# Patient Record
Sex: Female | Born: 1935 | Race: White | Hispanic: No | Marital: Married | Smoking: Never smoker
Health system: Southern US, Community
[De-identification: ages and names within clinical notes are randomized; demographics above are authoritative.]

## PROBLEM LIST (undated history)

## (undated) DIAGNOSIS — J9809 Other diseases of bronchus, not elsewhere classified: Secondary | ICD-10-CM

## (undated) DIAGNOSIS — R001 Bradycardia, unspecified: Secondary | ICD-10-CM

## (undated) DIAGNOSIS — M810 Age-related osteoporosis without current pathological fracture: Secondary | ICD-10-CM

## (undated) DIAGNOSIS — I480 Paroxysmal atrial fibrillation: Secondary | ICD-10-CM

## (undated) DIAGNOSIS — I10 Essential (primary) hypertension: Secondary | ICD-10-CM

## (undated) DIAGNOSIS — R0600 Dyspnea, unspecified: Secondary | ICD-10-CM

## (undated) DIAGNOSIS — S329XXA Fracture of unspecified parts of lumbosacral spine and pelvis, initial encounter for closed fracture: Secondary | ICD-10-CM

## (undated) DIAGNOSIS — H269 Unspecified cataract: Secondary | ICD-10-CM

## (undated) DIAGNOSIS — Z780 Asymptomatic menopausal state: Secondary | ICD-10-CM

## (undated) DIAGNOSIS — J8410 Pulmonary fibrosis, unspecified: Secondary | ICD-10-CM

## (undated) DIAGNOSIS — Z9289 Personal history of other medical treatment: Secondary | ICD-10-CM

## (undated) DIAGNOSIS — I679 Cerebrovascular disease, unspecified: Secondary | ICD-10-CM

## (undated) DIAGNOSIS — R011 Cardiac murmur, unspecified: Secondary | ICD-10-CM

## (undated) DIAGNOSIS — K5790 Diverticulosis of intestine, part unspecified, without perforation or abscess without bleeding: Secondary | ICD-10-CM

## (undated) DIAGNOSIS — M069 Rheumatoid arthritis, unspecified: Secondary | ICD-10-CM

## (undated) DIAGNOSIS — M1990 Unspecified osteoarthritis, unspecified site: Secondary | ICD-10-CM

## (undated) DIAGNOSIS — I499 Cardiac arrhythmia, unspecified: Secondary | ICD-10-CM

## (undated) HISTORY — DX: Unspecified cataract: H26.9

## (undated) HISTORY — DX: Paroxysmal atrial fibrillation: I48.0

## (undated) HISTORY — DX: Asymptomatic menopausal state: Z78.0

## (undated) HISTORY — DX: Bradycardia, unspecified: R00.1

## (undated) HISTORY — PX: CATARACT EXTRACTION: SUR2

## (undated) HISTORY — DX: Diverticulosis of intestine, part unspecified, without perforation or abscess without bleeding: K57.90

## (undated) HISTORY — DX: Unspecified osteoarthritis, unspecified site: M19.90

## (undated) HISTORY — PX: EYE SURGERY: SHX253

## (undated) HISTORY — DX: Cerebrovascular disease, unspecified: I67.9

## (undated) HISTORY — DX: Age-related osteoporosis without current pathological fracture: M81.0

## (undated) HISTORY — DX: Other diseases of bronchus, not elsewhere classified: J98.09

## (undated) HISTORY — PX: BREAST SURGERY: SHX581

## (undated) HISTORY — PX: COLONOSCOPY: SHX174

## (undated) HISTORY — DX: Fracture of unspecified parts of lumbosacral spine and pelvis, initial encounter for closed fracture: S32.9XXA

## (undated) HISTORY — DX: Personal history of other medical treatment: Z92.89

## (undated) HISTORY — PX: BIOPSY BREAST: PRO8

## (undated) HISTORY — DX: Essential (primary) hypertension: I10

---
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
## 1999-01-30 ENCOUNTER — Other Ambulatory Visit: Admission: RE | Admit: 1999-01-30 | Discharge: 1999-01-30 | Payer: Self-pay | Admitting: Family Medicine

## 2000-02-13 ENCOUNTER — Other Ambulatory Visit: Admission: RE | Admit: 2000-02-13 | Discharge: 2000-02-13 | Payer: Self-pay | Admitting: Family Medicine

## 2000-04-15 ENCOUNTER — Ambulatory Visit (HOSPITAL_COMMUNITY): Admission: RE | Admit: 2000-04-15 | Discharge: 2000-04-15 | Payer: Self-pay | Admitting: Gastroenterology

## 2002-03-10 ENCOUNTER — Other Ambulatory Visit: Admission: RE | Admit: 2002-03-10 | Discharge: 2002-03-10 | Payer: Self-pay | Admitting: Family Medicine

## 2003-07-18 ENCOUNTER — Encounter: Payer: Self-pay | Admitting: Family Medicine

## 2003-07-18 ENCOUNTER — Ambulatory Visit (HOSPITAL_COMMUNITY): Admission: RE | Admit: 2003-07-18 | Discharge: 2003-07-18 | Payer: Self-pay | Admitting: Family Medicine

## 2003-07-27 ENCOUNTER — Ambulatory Visit (HOSPITAL_COMMUNITY): Admission: RE | Admit: 2003-07-27 | Discharge: 2003-07-27 | Payer: Self-pay | Admitting: Family Medicine

## 2003-07-27 ENCOUNTER — Encounter: Payer: Self-pay | Admitting: Family Medicine

## 2005-01-21 ENCOUNTER — Other Ambulatory Visit: Admission: RE | Admit: 2005-01-21 | Discharge: 2005-01-21 | Payer: Self-pay | Admitting: Family Medicine

## 2006-06-27 ENCOUNTER — Inpatient Hospital Stay (HOSPITAL_COMMUNITY): Admission: AD | Admit: 2006-06-27 | Discharge: 2006-07-02 | Payer: Self-pay | Admitting: Cardiology

## 2006-08-11 ENCOUNTER — Encounter: Admission: RE | Admit: 2006-08-11 | Discharge: 2006-08-11 | Payer: Self-pay | Admitting: Cardiology

## 2006-09-29 ENCOUNTER — Ambulatory Visit (HOSPITAL_COMMUNITY): Admission: RE | Admit: 2006-09-29 | Discharge: 2006-09-30 | Payer: Self-pay | Admitting: Interventional Cardiology

## 2007-01-23 ENCOUNTER — Encounter: Admission: RE | Admit: 2007-01-23 | Discharge: 2007-01-23 | Payer: Self-pay | Admitting: Cardiology

## 2007-02-18 ENCOUNTER — Other Ambulatory Visit: Admission: RE | Admit: 2007-02-18 | Discharge: 2007-02-18 | Payer: Self-pay | Admitting: Family Medicine

## 2010-12-08 ENCOUNTER — Encounter: Payer: Self-pay | Admitting: Family Medicine

## 2010-12-09 ENCOUNTER — Encounter: Payer: Self-pay | Admitting: Cardiology

## 2013-02-03 ENCOUNTER — Telehealth: Payer: Self-pay | Admitting: Pharmacist

## 2013-02-03 DIAGNOSIS — I4891 Unspecified atrial fibrillation: Secondary | ICD-10-CM

## 2013-02-03 NOTE — Telephone Encounter (Signed)
appt made for follow up for 02-09-13

## 2013-02-09 ENCOUNTER — Ambulatory Visit (INDEPENDENT_AMBULATORY_CARE_PROVIDER_SITE_OTHER): Payer: Medicare Other | Admitting: Pharmacist Clinician (PhC)/ Clinical Pharmacy Specialist

## 2013-02-09 DIAGNOSIS — I482 Chronic atrial fibrillation, unspecified: Secondary | ICD-10-CM | POA: Insufficient documentation

## 2013-02-09 DIAGNOSIS — I4891 Unspecified atrial fibrillation: Secondary | ICD-10-CM

## 2013-02-22 ENCOUNTER — Telehealth: Payer: Self-pay | Admitting: *Deleted

## 2013-02-22 ENCOUNTER — Ambulatory Visit (INDEPENDENT_AMBULATORY_CARE_PROVIDER_SITE_OTHER): Payer: Medicare Other | Admitting: Pharmacist

## 2013-02-22 DIAGNOSIS — J069 Acute upper respiratory infection, unspecified: Secondary | ICD-10-CM

## 2013-02-22 DIAGNOSIS — I4891 Unspecified atrial fibrillation: Secondary | ICD-10-CM

## 2013-02-22 MED ORDER — AMOXICILLIN-POT CLAVULANATE 875-125 MG PO TABS: 1.0000 | ORAL_TABLET | Freq: Two times a day (BID) | ORAL | Status: DC

## 2013-02-22 NOTE — Telephone Encounter (Signed)
ALL SYMPTOMS NOW-  CONGESTION- STARTED IN HEAD NOW MOVING DOWN TO CHEST COUGH LOSS OF APPETITE NASAL DRAINAGE  NO SORE THROAT NO EAR PAIN NO EYE IRRITATION  USES [facility], [place] MEDS AND ALLERGIES ARE UP TO DATE- WENT OVER WITH PT TODAY PREFERS [place]?

## 2013-03-12 ENCOUNTER — Telehealth: Payer: Self-pay | Admitting: Family Medicine

## 2013-03-12 NOTE — Telephone Encounter (Signed)
Has daily swelling in the evening that generally resolves by morning.  Over the past 3-4 days she has had persistent swelling throughout the day.  Mild dyspnea with exertion but is still able to perform daily tasks.  She played golf yesterday.  Takes HCTZ 25mg  daily. She has taken a second HCTZ in the evening a few times and didn't notice an improvement.  She has Afib and has noticed some irregularity recently.    She has placed a call to her cardiologist Dr. Irish Lack with [facility].  His nurse is going to consult with him and call her back.    Suggested she wait to hear from the cardiologist and if they aren't able to assist her to give me a call back.    In the meantime she should rest with her feet elevated.

## 2013-04-05 ENCOUNTER — Ambulatory Visit (INDEPENDENT_AMBULATORY_CARE_PROVIDER_SITE_OTHER): Payer: Medicare Other | Admitting: Pharmacist

## 2013-04-05 DIAGNOSIS — I4891 Unspecified atrial fibrillation: Secondary | ICD-10-CM

## 2013-04-05 NOTE — Patient Instructions (Signed)
Anticoagulation Dose Instructions as of 04/05/2013     Dorene Grebe Tue Wed Thu Fri Sat   New Dose 2.5 mg 5 mg 2.5 mg 2.5 mg 2.5 mg 5 mg 2.5 mg    Description       5mg  [= 1 tablet] on mondays and fridays, 2.5mg [=1/2 tablet] all other days      INR = 2.5 today

## 2013-04-14 ENCOUNTER — Other Ambulatory Visit (INDEPENDENT_AMBULATORY_CARE_PROVIDER_SITE_OTHER): Payer: Medicare Other

## 2013-04-14 DIAGNOSIS — Z79899 Other long term (current) drug therapy: Secondary | ICD-10-CM

## 2013-04-14 DIAGNOSIS — E785 Hyperlipidemia, unspecified: Secondary | ICD-10-CM

## 2013-04-14 DIAGNOSIS — R5381 Other malaise: Secondary | ICD-10-CM

## 2013-04-14 DIAGNOSIS — N390 Urinary tract infection, site not specified: Secondary | ICD-10-CM

## 2013-04-14 DIAGNOSIS — R5383 Other fatigue: Secondary | ICD-10-CM

## 2013-04-14 LAB — POCT CBC
Granulocyte percent: 73.9 %G (ref 37–80)
Lymph, poc: 1.4 (ref 0.6–3.4)
MCV: 92.1 fL (ref 80–97)
MPV: 7.4 fL (ref 0–99.8)
Platelet Count, POC: 153 10*3/uL (ref 142–424)
RBC: 4.2 M/uL (ref 4.04–5.48)

## 2013-04-14 LAB — POCT URINALYSIS DIPSTICK
Nitrite, UA: NEGATIVE
Spec Grav, UA: 1.01
Urobilinogen, UA: NEGATIVE
pH, UA: 6.5

## 2013-04-14 LAB — BASIC METABOLIC PANEL WITH GFR
BUN: 20 mg/dL (ref 6–23)
Chloride: 106 mEq/L (ref 96–112)
GFR, Est Non African American: 55 mL/min — ABNORMAL LOW
Potassium: 4 mEq/L (ref 3.5–5.3)
Sodium: 142 mEq/L (ref 135–145)

## 2013-04-14 LAB — POCT UA - MICROSCOPIC ONLY
Casts, Ur, LPF, POC: NEGATIVE
Crystals, Ur, HPF, POC: NEGATIVE

## 2013-04-14 LAB — HEPATIC FUNCTION PANEL
Alkaline Phosphatase: 67 U/L (ref 39–117)
Bilirubin, Direct: 0.1 mg/dL (ref 0.0–0.3)
Indirect Bilirubin: 0.6 mg/dL (ref 0.0–0.9)
Total Bilirubin: 0.7 mg/dL (ref 0.3–1.2)

## 2013-04-15 LAB — NMR LIPOPROFILE WITH LIPIDS
Cholesterol, Total: 131 mg/dL (ref <200)
HDL Particle Number: 30.6 umol/L (ref >=30.5)
HDL-C: 53 mg/dL (ref >=40)
LP-IR Score: <25 (ref <=45)
Large HDL-P: 12 umol/L (ref >=4.8)
Triglycerides: 69 mg/dL (ref <150)

## 2013-04-19 ENCOUNTER — Telehealth: Payer: Self-pay | Admitting: Family Medicine

## 2013-04-19 NOTE — Telephone Encounter (Signed)
Pt notified of lab results

## 2013-04-19 NOTE — Telephone Encounter (Signed)
Message copied by Marin Olp on Mon Apr 19, 2013  6:05 PM ------      Message from: Chipper Herb      Created: Thu Apr 15, 2013  8:24 PM       The BMP had normal electrolytes, a normal blood sugar, and a normal kidney function tests.      2 liver function tests were slightly elevated. The liver function tests should be rechecked in 4-6 weeks.      All cholesterol test was advanced lipid testing were excellent and goal.                   ------

## 2013-04-21 ENCOUNTER — Encounter: Payer: Self-pay | Admitting: Family Medicine

## 2013-04-21 ENCOUNTER — Ambulatory Visit (INDEPENDENT_AMBULATORY_CARE_PROVIDER_SITE_OTHER): Payer: Medicare Other

## 2013-04-21 ENCOUNTER — Ambulatory Visit (INDEPENDENT_AMBULATORY_CARE_PROVIDER_SITE_OTHER): Payer: Medicare Other | Admitting: Family Medicine

## 2013-04-21 VITALS — BP 126/77 | HR 106 | Temp 98.1°F | Ht 61.25 in | Wt 136.4 lb

## 2013-04-21 DIAGNOSIS — M810 Age-related osteoporosis without current pathological fracture: Secondary | ICD-10-CM

## 2013-04-21 DIAGNOSIS — M545 Low back pain, unspecified: Secondary | ICD-10-CM

## 2013-04-21 DIAGNOSIS — I10 Essential (primary) hypertension: Secondary | ICD-10-CM

## 2013-04-21 DIAGNOSIS — Z0000 Encounter for general adult medical examination without abnormal findings: Secondary | ICD-10-CM

## 2013-04-21 DIAGNOSIS — R5381 Other malaise: Secondary | ICD-10-CM

## 2013-04-21 DIAGNOSIS — R5383 Other fatigue: Secondary | ICD-10-CM

## 2013-04-21 DIAGNOSIS — R0602 Shortness of breath: Secondary | ICD-10-CM

## 2013-04-21 DIAGNOSIS — I4891 Unspecified atrial fibrillation: Secondary | ICD-10-CM

## 2013-04-21 LAB — THYROID PANEL WITH TSH
T3 Uptake: 33.8 % (ref 22.5–37.0)
T4, Total: 9.1 ug/dL (ref 5.0–12.5)

## 2013-04-21 NOTE — Progress Notes (Addendum)
Subjective:    Patient ID: Susan Fitzgerald, female    DOB: 09-30-1932, 79 y.o.   MRN: [id number]  HPI Patient comes in today for followup of chronic medical problems and a physical exam. Her biggest complaint today is the increase shortness of breath that she has with especially going up inclines. She is seeing a cardiologist and he is trying to regulate some of her high risk medication. She has had increased edema in her legs for the past 3 or 4 weeks as well as an increased heart rate.   Review of Systems  Constitutional: Negative.   HENT: Negative.   Eyes: Negative.   Respiratory: Positive for shortness of breath (with exertion).   Cardiovascular: Positive for palpitations (occasional) and leg swelling (daily). Negative for chest pain.  Gastrointestinal: Negative.   Genitourinary: Positive for frequency (due to meds).  Musculoskeletal: Positive for back pain (LBP).  Skin: Negative.   Allergic/Immunologic: Negative.   Neurological: Positive for light-headedness (with movement). Negative for headaches.  Hematological: Bruises/bleeds easily (due to meds).  Psychiatric/Behavioral: Positive for sleep disturbance (nightly).       Objective:   Physical Exam .BP 126/77  Pulse 106  Temp(Src) 98.1 F (36.7 C) (Oral)  Ht 5' 1.25" (1.556 m)  Wt 136 lb 6.4 oz (61.871 kg)  BMI 25.55 kg/m2  The patient appeared well nourished and normally developed, alert and oriented to time and place. Speech, behavior and judgement appear normal. Vital signs as documented.  Head exam is unremarkable. No scleral icterus or pallor noted. Mouth and throat were normal. TMs were normal and ear canals were Neck is without jugular venous distension, thyromegally, or carotid bruits. Carotid upstrokes are brisk bilaterally. No cervical adenopathy. Lungs are clear anteriorly and posteriorly to auscultation. Normal respiratory effort. Cardiac exam reveals irregular irregular rate and rhythm at 96 per minute. First  and second heart sounds normal.  No murmurs, rubs or gallops.  Abdominal exam reveals normal bowl sounds, no masses, no organomegaly and no aortic enlargement. No inguinal adenopathy. No abdominal bruits. Extremities are 1+ edematous and both femoral and pedal pulses are normal. Low back is painful at time. Skin without pallor or jaundice.  Warm and dry, without rash. Neurologic exam reveals normal deep tendon reflexes and normal sensation.  [facility] reading (PRIMARY) by  Dr. Laurance Flatten: Chest x-ray; mild cardiac enlargement                                                                      LS-spine; degenerative disc disease and scoliosis                                       Assessment & Plan:  1. Hypertension - DG Chest 2 View; Future  2. Osteoporosis - DG Lumbar Spine 2-3 Views; Future  3. LBP (low back pain) - DG Lumbar Spine 2-3 Views; Future  4. Fatigue - Thyroid Panel With TSH  5. Low back pain  6. Atrial fibrillation  7. Annual physical exam  Patient Instructions  Continue current medications See cardiologist about better rate control Be careful not to climb and had an accident and fall We will arrange  appointment for pelvic exam

## 2013-04-21 NOTE — Patient Instructions (Addendum)
Continue current medications See cardiologist about better rate control Be careful not to climb and had an accident and fall We will arrange appointment for pelvic exam

## 2013-04-22 ENCOUNTER — Telehealth: Payer: Self-pay | Admitting: *Deleted

## 2013-04-22 NOTE — Telephone Encounter (Signed)
Message copied by Marin Olp on Thu Apr 22, 2013  6:24 PM ------      Message from: Chipper Herb      Created: Wed Apr 21, 2013  6:04 PM       As per report, there is moderate cardiomegaly      Make sure that cardiologist gives copy of this      Make sure that patient gets paper copy of this report ------

## 2013-04-22 NOTE — Telephone Encounter (Signed)
Pt notified of results

## 2013-04-22 NOTE — Telephone Encounter (Signed)
Pt notified Copy sent to Dr Irish Lack

## 2013-04-22 NOTE — Telephone Encounter (Signed)
Message copied by Marin Olp on Thu Apr 22, 2013  4:40 PM ------      Message from: Chipper Herb      Created: Thu Apr 22, 2013  8:20 AM       All thyroid tests within normal limit      Send copy to cardiologist ------

## 2013-04-29 ENCOUNTER — Ambulatory Visit (INDEPENDENT_AMBULATORY_CARE_PROVIDER_SITE_OTHER): Payer: Medicare Other | Admitting: Pharmacist

## 2013-04-29 VITALS — BP 132/76 | HR 100

## 2013-04-29 DIAGNOSIS — I4891 Unspecified atrial fibrillation: Secondary | ICD-10-CM

## 2013-04-29 LAB — POCT INR: INR: 2.1

## 2013-04-29 NOTE — Progress Notes (Signed)
Left message on Amy's voice mail - nurse at [facility] cardiology to let them know about patient's HR and BP.  Patient is concerned about how she feels - legs are heavy and fatigues easier.

## 2013-05-17 ENCOUNTER — Ambulatory Visit: Payer: Self-pay | Admitting: Pharmacist

## 2013-05-17 ENCOUNTER — Other Ambulatory Visit (INDEPENDENT_AMBULATORY_CARE_PROVIDER_SITE_OTHER): Payer: Medicare Other

## 2013-05-17 DIAGNOSIS — I4891 Unspecified atrial fibrillation: Secondary | ICD-10-CM

## 2013-05-17 LAB — POCT INR: INR: 2.1

## 2013-05-17 NOTE — Patient Instructions (Signed)
Anticoagulation Dose Instructions as of 05/17/2013     Susan Fitzgerald Tue Wed Thu Fri Sat   New Dose 2.5 mg 5 mg 2.5 mg 2.5 mg 2.5 mg 5 mg 2.5 mg    Description       Continue 5mg  [= 1 tablet] on mondays and fridays, 2.5mg [=1/2 tablet] all other days.         INR was 2.1

## 2013-05-24 ENCOUNTER — Telehealth: Payer: Self-pay | Admitting: Family Medicine

## 2013-05-24 NOTE — Telephone Encounter (Signed)
Patient states that she fell down basement steps yesterday and cut her chin. Bleeding has stopped and she is just a little sore. Advised patient that we would be happy to look at area although it may be too late for stitches. She stated that she would wait and if it became worse she would let us know.

## 2013-05-26 NOTE — Progress Notes (Signed)
Patient came in for labs only.

## 2013-05-31 ENCOUNTER — Ambulatory Visit (INDEPENDENT_AMBULATORY_CARE_PROVIDER_SITE_OTHER): Payer: Medicare Other | Admitting: Pharmacist

## 2013-05-31 DIAGNOSIS — I4891 Unspecified atrial fibrillation: Secondary | ICD-10-CM

## 2013-05-31 DIAGNOSIS — Z79899 Other long term (current) drug therapy: Secondary | ICD-10-CM

## 2013-05-31 NOTE — Patient Instructions (Signed)
Anticoagulation Dose Instructions as of 05/31/2013     Dorene Grebe Tue Wed Thu Fri Sat   New Dose 2.5 mg 5 mg 2.5 mg 2.5 mg 2.5 mg 5 mg 2.5 mg    Description       Continue 5mg  [= 1 tablet] on mondays and fridays, 2.5mg [=1/2 tablet] all other days.         INR was 2.1 today

## 2013-06-01 ENCOUNTER — Telehealth: Payer: Self-pay | Admitting: Pharmacist

## 2013-06-01 LAB — BASIC METABOLIC PANEL WITH GFR
Calcium: 9.2 mg/dL (ref 8.4–10.5)
GFR, Est African American: 71 mL/min
GFR, Est Non African American: 62 mL/min
Glucose, Bld: 67 mg/dL — ABNORMAL LOW (ref 70–99)
Sodium: 140 mEq/L (ref 135–145)

## 2013-06-01 NOTE — Telephone Encounter (Signed)
BMP results showed low blood glucose.  Discussed with patient s/s of hypoglycemia.   Her husband has glucometer.  Advised that if she has signs of hypoglycemia she should check BG.  If less than 70 then needs to drink OJ or regular soda.   Recommended that patient eat 3 meals daily and small snacks during the day especially when involved in physical activity.

## 2013-06-02 ENCOUNTER — Encounter (HOSPITAL_COMMUNITY): Payer: Self-pay | Admitting: Cardiology

## 2013-06-02 ENCOUNTER — Inpatient Hospital Stay (HOSPITAL_COMMUNITY)
Admission: AD | Admit: 2013-06-02 | Discharge: 2013-06-04 | DRG: 310 | Disposition: A | Discharge status: Home / Self Care | Payer: Medicare Other | Source: Ambulatory Visit | Attending: Interventional Cardiology | Admitting: Interventional Cardiology

## 2013-06-02 ENCOUNTER — Ambulatory Visit (INDEPENDENT_AMBULATORY_CARE_PROVIDER_SITE_OTHER): Payer: Medicare Other | Admitting: Family Medicine

## 2013-06-02 VITALS — BP 156/70 | HR 48 | Temp 98.2°F

## 2013-06-02 DIAGNOSIS — Z7901 Long term (current) use of anticoagulants: Secondary | ICD-10-CM

## 2013-06-02 DIAGNOSIS — I4891 Unspecified atrial fibrillation: Secondary | ICD-10-CM | POA: Diagnosis present

## 2013-06-02 DIAGNOSIS — R531 Weakness: Secondary | ICD-10-CM

## 2013-06-02 DIAGNOSIS — I359 Nonrheumatic aortic valve disorder, unspecified: Secondary | ICD-10-CM | POA: Diagnosis present

## 2013-06-02 DIAGNOSIS — Z823 Family history of stroke: Secondary | ICD-10-CM

## 2013-06-02 DIAGNOSIS — R5383 Other fatigue: Secondary | ICD-10-CM

## 2013-06-02 DIAGNOSIS — R5381 Other malaise: Secondary | ICD-10-CM

## 2013-06-02 DIAGNOSIS — R001 Bradycardia, unspecified: Secondary | ICD-10-CM | POA: Diagnosis present

## 2013-06-02 DIAGNOSIS — I498 Other specified cardiac arrhythmias: Secondary | ICD-10-CM

## 2013-06-02 DIAGNOSIS — I440 Atrioventricular block, first degree: Secondary | ICD-10-CM | POA: Diagnosis present

## 2013-06-02 DIAGNOSIS — Z9849 Cataract extraction status, unspecified eye: Secondary | ICD-10-CM

## 2013-06-02 DIAGNOSIS — I482 Chronic atrial fibrillation, unspecified: Secondary | ICD-10-CM | POA: Diagnosis present

## 2013-06-02 DIAGNOSIS — I10 Essential (primary) hypertension: Secondary | ICD-10-CM | POA: Diagnosis present

## 2013-06-02 DIAGNOSIS — I495 Sick sinus syndrome: Principal | ICD-10-CM | POA: Diagnosis present

## 2013-06-02 DIAGNOSIS — M810 Age-related osteoporosis without current pathological fracture: Secondary | ICD-10-CM

## 2013-06-02 DIAGNOSIS — Z79899 Other long term (current) drug therapy: Secondary | ICD-10-CM

## 2013-06-02 DIAGNOSIS — Z8249 Family history of ischemic heart disease and other diseases of the circulatory system: Secondary | ICD-10-CM

## 2013-06-02 LAB — PROTIME-INR: INR: 1.72 — ABNORMAL HIGH (ref 0.00–1.49)

## 2013-06-02 MED ORDER — WARFARIN SODIUM 5 MG PO TABS: 5.0000 mg | ORAL_TABLET | Freq: Every day | ORAL | Status: DC

## 2013-06-02 MED ORDER — WARFARIN SODIUM 5 MG PO TABS
5.0000 mg | ORAL_TABLET | Freq: Once | ORAL | Status: AC
  Administered 2013-06-02: 5 mg via ORAL
  Filled 2013-06-02: qty 1

## 2013-06-02 MED ORDER — FUROSEMIDE 40 MG PO TABS
40.0000 mg | ORAL_TABLET | Freq: Every morning | ORAL | Status: DC
  Filled 2013-06-02: qty 1

## 2013-06-02 MED ORDER — SODIUM CHLORIDE 0.9 % IJ SOLN: 3.0000 mL | INTRAMUSCULAR | Status: DC | PRN

## 2013-06-02 MED ORDER — SODIUM CHLORIDE 0.9 % IV SOLN: 250.0000 mL | INTRAVENOUS | Status: DC | PRN

## 2013-06-02 MED ORDER — POTASSIUM CHLORIDE CRYS ER 20 MEQ PO TBCR: 20.0000 meq | EXTENDED_RELEASE_TABLET | Freq: Every day | ORAL | Status: DC

## 2013-06-02 MED ORDER — WARFARIN - PHARMACIST DOSING INPATIENT
Freq: Every day | Status: DC
  Administered 2013-06-02: 17:00:00

## 2013-06-02 MED ORDER — SODIUM CHLORIDE 0.9 % IJ SOLN: 3.0000 mL | Freq: Two times a day (BID) | INTRAMUSCULAR | Status: DC

## 2013-06-02 MED ORDER — POTASSIUM CHLORIDE CRYS ER 20 MEQ PO TBCR
20.0000 meq | EXTENDED_RELEASE_TABLET | Freq: Every day | ORAL | Status: DC
  Administered 2013-06-03 – 2013-06-04 (×2): 20 meq via ORAL
  Filled 2013-06-02 (×2): qty 1

## 2013-06-02 MED ORDER — FLECAINIDE ACETATE 50 MG PO TABS
75.0000 mg | ORAL_TABLET | Freq: Two times a day (BID) | ORAL | Status: DC
  Administered 2013-06-02 – 2013-06-04 (×4): 75 mg via ORAL
  Filled 2013-06-02 (×6): qty 2

## 2013-06-02 MED ORDER — FUROSEMIDE 20 MG PO TABS
20.0000 mg | ORAL_TABLET | Freq: Every day | ORAL | Status: DC
  Administered 2013-06-03 – 2013-06-04 (×2): 20 mg via ORAL
  Filled 2013-06-02 (×2): qty 1

## 2013-06-02 MED ORDER — AMLODIPINE BESYLATE 5 MG PO TABS
5.0000 mg | ORAL_TABLET | Freq: Every day | ORAL | Status: DC
  Filled 2013-06-02: qty 1

## 2013-06-02 MED ORDER — SODIUM CHLORIDE 0.9 % IJ SOLN
3.0000 mL | Freq: Two times a day (BID) | INTRAMUSCULAR | Status: DC
  Administered 2013-06-02 – 2013-06-03 (×3): 3 mL via INTRAVENOUS

## 2013-06-02 NOTE — Patient Instructions (Signed)
Give to admissions is as planned Present recorded EKG to the admissions physician

## 2013-06-02 NOTE — Consult Note (Signed)
ELECTROPHYSIOLOGY CONSULT NOTE  Patient ID: Susan Fitzgerald, MRN: [id number], DOB/AGE: Mar 02, 1932 79 y.o. Admit date: 06/02/2013 Date of Consult: 06/02/2013  Primary Physician: Redge Gainer, MD Primary Cardiologist: jv Chief Complaint: Slow heart   HPI Susan Fitzgerald is a 79 y.o. female  Admitted after she presented to her primary care physician this morning with a heart rate of 33. This was sinus with PACs.  She has a history of atrial fibrillation that dates back about a decade. She underwent cardioversion. She was treated with flecainide and metoprolol. This for further well until February 2014 when she had persistence of atrial fibrillation. Rather than submitting her for DC cardioversion, her flecainide was increased initially and then changed to amiodarone. One month after initiation of amiodarone she was found to be in atrial fibrillation with a heart rate of 51. Amiodarone was continued. Ultimately was discontinued and flecainide was resumed in conjunction with digoxin. Metoprolol had been continued throughout and was uptitrated.  She is an avid Air cabin crew. She notes that she has been is increasingly challenged in completing a round of golf now been able only to do 5-8 holes before she gives out. Interestingly, by her records from her vital signs which he keeps, it is her impression that she has been in regular rhythm since the end of June; not withstanding, she has not been able to walk her normal golf. It is worth noting that she has had significant bradycardia over recent months and that there has been gradual up titration of her AV nodal blocking agents even in the context of her bradycardia.  She notes that she has also had a 10 pound unintentional weight loss although this has been concurrent with use of diuretics and the resolution of her peripheral edema     Past Medical History  Diagnosis Date  . Menopause   . A-fib     04/09/13-nuclear stress-no ischemia low risk  .  Hypertension   . Osteoporosis   . Diverticulosis   . Cataract       Surgical History:  Past Surgical History  Procedure Laterality Date  . Cataract extraction    . Biopsy breast       Home Meds: Prior to Admission medications   Medication Sig Start Date End Date Taking? Authorizing Provider  amLODipine (NORVASC) 5 MG tablet Take 5 mg by mouth daily.   Yes Historical Provider, MD  aspirin 81 MG tablet Take 81 mg by mouth daily.   Yes Historical Provider, MD  calcium-vitamin D (OSCAL WITH D) 250-125 MG-UNIT per tablet Take 1 tablet by mouth 2 (two) times daily.   Yes Historical Provider, MD  digoxin (LANOXIN) 0.125 MG tablet Take 0.125 mg by mouth daily.   Yes Historical Provider, MD  flecainide (TAMBOCOR) 100 MG tablet Take 100 mg by mouth 2 (two) times daily.   Yes Historical Provider, MD  furosemide (LASIX) 20 MG tablet Take 20 mg by mouth daily.   Yes Historical Provider, MD  metoprolol (LOPRESSOR) 50 MG tablet Take 75 mg by mouth 2 (two) times daily.    Yes Historical Provider, MD  Multiple Vitamin (MULTIVITAMIN) capsule Take 1 capsule by mouth daily.   Yes Historical Provider, MD  potassium chloride SA (K-DUR,KLOR-CON) 20 MEQ tablet Take 20 mEq by mouth daily.   Yes Historical Provider, MD  warfarin (COUMADIN) 5 MG tablet Take 2.5-5 mg by mouth daily. 5mg  Mon + Fri, 2.5mg  all other days   Yes Historical Provider, MD    Inpatient  Medications:  . [START ON 06/03/2013] amLODipine  5 mg Oral Daily  . [START ON 06/03/2013] furosemide  20 mg Oral Daily  . [START ON 06/03/2013] potassium chloride  20 mEq Oral Daily  . sodium chloride  3 mL Intravenous Q12H  . sodium chloride  3 mL Intravenous Q12H  . Warfarin - Pharmacist Dosing Inpatient   Does not apply q1800     Allergies:  Allergies  Allergen Reactions  . Benazepril Hcl Cough    History   Social History  . Marital Status: Married    Spouse Name: N/A    Number of Children: N/A  . Years of Education: N/A   Occupational  History  . Not on file.   Social History Main Topics  . Smoking status: Never Smoker   . Smokeless tobacco: Not on file  . Alcohol Use: No  . Drug Use: No  . Sexually Active: Not on file   Other Topics Concern  . Not on file   Social History Narrative  . No narrative on file     Family History  Problem Relation Age of Onset  . Stroke Mother   . Heart disease Father      ROS:  Please see the history of present illness.     All other systems reviewed and negative.    Physical Exam:   Blood pressure 152/78, pulse 51, temperature 98.4 F (36.9 C), temperature source Oral, resp. rate 18, SpO2 97.00%. General: Well developed, well nourished female in no acute distress. Head: Normocephalic, atraumatic, sclera non-icteric, no xanthomas, nares are without discharge. EENT: normal Lymph Nodes:  none Back: without scoliosis/kyphosis , no CVA tendersness Neck: Negative for carotid bruits. JVD not elevated. Lungs: Clear bilaterally to auscultation without wheezes, rales, or rhonchi. Breathing is unlabored. Heart: RRR with S1 S2. No  murmur , rubs, or gallops appreciated. Abdomen: Soft, non-tender, non-distended with normoactive bowel sounds. No hepatomegaly. No rebound/guarding. No obvious abdominal masses. Msk:  Strength and tone appear normal for age. Extremities: No clubbing or cyanosis. No edema.  Distal pedal pulses are 2+ and equal bilaterally. Skin: Warm and Dry venous insufficiency changes Neuro: Alert and oriented X 3. CN III-XII intact Grossly normal sensory and motor function . Psych:  Responds to questions appropriately with a normal affect.      Labs: Cardiac Enzymes No results found for this basename: CKTOTAL, CKMB, TROPONINI,  in the last 72 hours CBC Lab Results  Component Value Date   WBC 6.4 04/14/2013   HGB 13.7 04/14/2013   HCT 38.3 04/14/2013   MCV 92.1 04/14/2013   PROTIME:  Recent Labs  05/31/13 1128 06/02/13 1551  LABPROT  --  19.7*  INR 2.1 1.72*    Chemistry  Recent Labs Lab 05/31/13 1148  NA 140  K 4.1  CL 100  CO2 32  BUN 17  CREATININE 0.89  CALCIUM 9.2  GLUCOSE 67*   Lipids Lab Results  Component Value Date   LDLCALC 64 04/14/2013   TRIG 69 04/14/2013   BNP No results found for this basename: probnp   Miscellaneous No results found for this basename: DDIMER    Radiology/Studies:  No results found.  EKG: sinus at 30 with pac  Old ECGs were reviewed back to February 2014 at all include atrial fibrillation with rates as low as 50. Documented rates are . 80-100 stress test. No strips are not available to review  Echocardiogram today preliminarily demonstrates normal LV function, mild-moderate AI with a left  atrial dimension of 4.0  Assessment and Plan:   She has had recurrence of atrial fibrillation. She is in sinus rhythm currently and in her mind converted the end of June. The fact that she has had persistent symptoms then suggested atrial fibrillation is not the issue, but rather, I wonder whether it is chronotropic incompetence. We certainly have had much rate control medication to her regime; she may have some intrinsic sinus node dysfunction.  Left atrial dimension is 4. I would be inclined to continue her on her flecainide and markedly reduced her rate controlling medications. Stress testing might be useful again to assess for chronotropic competence an event recorder to clarify whether these episodes of bradycardia will resolve with reduction of her medications.  At this point I don't think pacing will be necessary although she may well come to this.   Virl Axe

## 2013-06-02 NOTE — Progress Notes (Signed)
Reviewed notes, ECG personally with Dr. Caryl Comes. Appreciate his recommendations. Will continue Flecainide but avoid dig, Bb at this time.   Aortic insufficiency is moderate on ECHO with normal LV size, EF, and normal LA size. IVC is mildly dilated, and agree with lasix (elevated CVP likely contributing to Edema). No evidence of significant PHTN.   Will monitor. Chronotropic incompetence may be the key player in her symptoms.

## 2013-06-02 NOTE — Progress Notes (Signed)
*  PRELIMINARY RESULTS* Echocardiogram 2D Echocardiogram has been performed.  Leavy Cella 06/02/2013, 4:59 PM

## 2013-06-02 NOTE — H&P (Signed)
Admit date: 06/02/2013 Primary Physician  Dr. Redge Gainer Primary Cardiologist  Dr. Irish Lack  CC: Bradycardia, shortness of breath, dizziness, symptomatic.  HPI: 79 year old female with history of atrial fibrillation on chronic anticoagulation, hypertension, no prior myocardial infarction or stroke who presented today to her primary physician's office, Dr. Laurance Flatten after 2 separate episodes of symptomatic bradycardia. She is an avid golfer and recently over the weekend did not feel like completing her round. She's been feeling fatigued, washed out. She states that she had 2 distinct episodes however where she felt increased shortness of breath and dizziness and recorded her pulse in the 30s. However, she has stated that even when her pulse is normally in the low 50s to 60s, she has not felt herself since being in the atrial fibrillation.  Dr. Irish Lack recently transitioned her from amiodarone to flecainide. Amiodarone was discontinued because she did not feel well on this medication. She tells me that she has friends that this medicine works for but for her it made her feel awful. She also states that since stopping the amiodarone she still continues to feel poorly. Her energy level has decreased. She was originally placed on flecainide 50 mg twice a day but this was subsequently increased to 100 mg twice a day as her current dosing. She also recently indicated that her metoprolol had been reduced from 100 mg twice a day down to 75 mg twice a day.  While at Dr. Tawanna Sat office, an EKG was performed which demonstrated heart rate of 33 beats per minute, narrow complex QRS. He was interpreted as possible atrial fibrillation. On rhythm strip, lead to, there does appear to be P wave activity preceding the first 3 QRS complexes. This tracing  could represent sinus bradycardia with PACs.  On 04/09/13 she underwent a pharmacologic stress test which was low risk, no ischemia. Unable to gate.    PMH:   Past  Medical History  Diagnosis Date  . Menopause   . A-fib   . Hypertension   . Osteoporosis   . Diverticulosis   . Cataract     PSH:   Past Surgical History  Procedure Laterality Date  . Cataract extraction    . Biopsy breast     Allergies:  Benazepril hcl Prior to Admit Meds:   Prescriptions prior to admission  Medication Sig Dispense Refill  . amLODipine (NORVASC) 5 MG tablet Take 5 mg by mouth daily.      Marland Kitchen aspirin 81 MG tablet Take 81 mg by mouth daily.      . Calcium Carbonate-Vitamin D (CALCIUM 600+D) 600-400 MG-UNIT per tablet Take 2 tablets by mouth daily.      . digoxin (LANOXIN) 0.125 MG tablet Take 0.125 mg by mouth daily.      . flecainide (TAMBOCOR) 100 MG tablet Take 100 mg by mouth 2 (two) times daily.      . furosemide (LASIX) 20 MG tablet Take 40 mg by mouth every morning.      Marland Kitchen KLOR-CON M20 20 MEQ tablet       . metoprolol (LOPRESSOR) 50 MG tablet Take 75 mg by mouth 2 (two) times daily.       . Multiple Vitamin (MULTIVITAMIN) capsule Take 1 capsule by mouth daily.      Marland Kitchen warfarin (COUMADIN) 5 MG tablet Take 5 mg by mouth daily. AS DIRECTED       Fam HX:    Family History  Problem Relation Age of Onset  . Stroke Mother   .  Heart disease Father    Social HX:    History   Social History  . Marital Status: Married    Spouse Name: N/A    Number of Children: N/A  . Years of Education: N/A   Occupational History  . Not on file.   Social History Main Topics  . Smoking status: Never Smoker   . Smokeless tobacco: Not on file  . Alcohol Use: No  . Drug Use: No  . Sexually Active: Not on file   Other Topics Concern  . Not on file   Social History Narrative  . No narrative on file     ROS:  Denies any strokelike symptoms, frank syncope, orthopnea, chest pain, rashes, dysphagia, melena. She did sustain a fall approximately one week ago resulting in a small chin laceration. She states that this was not secondary to syncope but she "missed the last step  ". Her husband used new skin glue. All 11 ROS were addressed and are negative except what is stated in the HPI  Temperature 98.2, pulse 48, blood pressure 156/70 Physical Exam:  General: Well developed, well nourished, in no acute distress Head: Eyes PERRLA, No xanthomas.   Normal cephalic. Chin excoriation noted with hematoma  Lungs:   Clear bilaterally to auscultation and percussion. Normal respiratory effort. No wheezes, no rales. Heart:  Bradycardic, fairly regular with occasional ectopy. Pulses are 2+ & equal.        No carotid bruit. No JVD.  No abdominal bruits. Abdomen: Bowel sounds are positive, abdomen soft and non-tender without masses. No hepatosplenomegaly. Msk:  Back normal, normal gait. Normal strength and tone for age. Extremities:  No clubbing, cyanosis, trace edema.  DP +1 Neuro: Alert and oriented X 3, non-focal, MAE x 4 GU: Deferred Rectal: Deferred Psych:  Good affect, responds appropriately    Labs:   Lab Results  Component Value Date   WBC 6.4 04/14/2013   HGB 13.7 04/14/2013   HCT 38.3 04/14/2013   MCV 92.1 04/14/2013     Recent Labs Lab 05/31/13 1148  NA 140  K 4.1  CL 100  CO2 32  BUN 17  CREATININE 0.89  CALCIUM 9.2  GLUCOSE 67*   No results found for this basename: PTT   Lab Results  Component Value Date   INR 2.1 05/31/2013   INR 2.1 05/17/2013   INR 2.1 04/29/2013   Lab Results  Component Value Date   LDLCALC 64 04/14/2013   Lab Results  Component Value Date   TRIG 69 04/14/2013      Radiology:  No results found.    EKG:  As above in history of present illness.  ASSESSMENT/PLAN:   79 year old female with atrial fibrillation, tachycardia/bradycardia syndrome, symptomatic bradycardia, hypertension on chronic anticoagulation.  - I will discontinue/hold current AV nodal blocking agents/antiarrhythmics which include digoxin, metoprolol 75 mg twice a day, flecainide 100 mg twice a day. Continue to monitor on telemetry. Recent TSH  normal. Recent INR 2.1. Recent potassium 4.1. Creatinine is 0.89.  - I have discussed with her pacemaker and rationale behind treatment. I will consult electrophysiology.  - Continue with warfarin, pharmacy consult  - Continue to monitor rhythm. As I stated above, EKG from Dr. Tawanna Sat office at 10:54 AM May demonstrates sinus bradycardia with PACs. Nonetheless, continue to treat her atrial fibrillation, and in order to treat this effectively, pacemaker will be needed to protect from bradycardia.  - Dr. Irish Lack is currently out of office. Our practice will continue  to follow.  Candee Furbish, MD  06/02/2013  1:49 PM

## 2013-06-02 NOTE — Progress Notes (Addendum)
ANTICOAGULATION CONSULT NOTE - Initial Consult  Pharmacy Consult for warfarin Indication: atrial fibrillation  Allergies  Allergen Reactions  . Benazepril Hcl Cough    Vital Signs: Temp: 98.2 F (36.8 C) (07/16 1036) Temp src: Oral (07/16 1036) BP: 156/70 mmHg (07/16 1036) Pulse Rate: 48 (07/16 1036)  Labs:  Recent Labs  05/31/13 1128 05/31/13 1148  INR 2.1  --   CREATININE  --  0.89    The CrCl is unknown because both a height and weight (above a minimum accepted value) are required for this calculation.   Medical History: Past Medical History  Diagnosis Date  . Menopause   . A-fib     04/09/13-nuclear stress-no ischemia low risk  . Hypertension   . Osteoporosis   . Diverticulosis   . Cataract     Medications:  Prescriptions prior to admission  Medication Sig Dispense Refill  . amLODipine (NORVASC) 5 MG tablet Take 5 mg by mouth daily.      Marland Kitchen aspirin 81 MG tablet Take 81 mg by mouth daily.      . calcium-vitamin D (OSCAL WITH D) 250-125 MG-UNIT per tablet Take 1 tablet by mouth 2 (two) times daily.      . digoxin (LANOXIN) 0.125 MG tablet Take 0.125 mg by mouth daily.      . flecainide (TAMBOCOR) 100 MG tablet Take 100 mg by mouth 2 (two) times daily.      . furosemide (LASIX) 20 MG tablet Take 20 mg by mouth daily.      . metoprolol (LOPRESSOR) 50 MG tablet Take 75 mg by mouth 2 (two) times daily.       . Multiple Vitamin (MULTIVITAMIN) capsule Take 1 capsule by mouth daily.      . potassium chloride SA (K-DUR,KLOR-CON) 20 MEQ tablet Take 20 mEq by mouth daily.      Marland Kitchen warfarin (COUMADIN) 5 MG tablet Take 2.5-5 mg by mouth daily. 5mg  Mon + Fri, 2.5mg  all other days        Assessment: 85 yof presented from MD office with symptomatic bradycardia, SOB and dizziness. She is on chronic coumadin for afib. Her INR was therapeutic on 7/14 on her current home regimen of 5mg  on Mon + Fri and 2.5mg  all other days. INR has not been checked today. Pt took her last dose  yesterday on 7/15.  Goal of Therapy:  INR 2-3   Plan:  1. INR now and daily 2. If INR is at goal, will resume home regimen of coumadin 5mg  on Monday & Friday and 2.5mg  all other days  Rumbarger, Rande Lawman 06/02/2013,3:10 PM  Lab Results  Component Value Date   INR 1.72* 06/02/2013   INR 2.1 05/31/2013   INR 2.1 05/17/2013   Today's INR is slightly subtherapeutic.  Plan: Increase Coumadin to 5mg  today. Daily PT/INR.  Legrand Como, Pharm.D., BCPS, AAHIVP Clinical Pharmacist Phone: [phone] or [phone] 06/02/2013, 4:48 PM

## 2013-06-02 NOTE — Progress Notes (Signed)
  Subjective:    Patient ID: Susan Fitzgerald, female    DOB: November 12, 1932, 79 y.o.   MRN: [id number]  HPI Patient called the office yesterday after speaking with her cardiology office. She requested a Holter monitor be placed on her because of the delay of one being done at the cardiologist's office. This was requested because of low heart rates recorded at home. She came in this morning for placement of a Holter monitor and the nurse reported a heart rate in the 30s. An EKG was done and the heart rate was 34. She had complaints of shortness of breath dizziness and weakness especially with standing. She was alert and oriented and able to relate her history well. She indicated that her metoprolol had recently been reduced from 100 mg twice a day to 75 mg twice a day   Review of Systems  Respiratory: Positive for shortness of breath (WITH ACTIVITY).   Cardiovascular: Positive for palpitations. Negative for chest pain.  Gastrointestinal: Negative for nausea and vomiting.  Neurological: Positive for weakness and light-headedness.       Objective:   Physical Exam  Vitals reviewed. Constitutional: She is oriented to person, place, and time. She appears well-developed and well-nourished. No distress.  HENT:  Head: Normocephalic.  Eyes: Conjunctivae are normal. Right eye exhibits no discharge. Left eye exhibits no discharge. No scleral icterus.  Neck: Normal range of motion. Neck supple. Thyromegaly present.  Cardiovascular: Exam reveals no gallop and no friction rub.   No murmur heard. Bradycardic rate at 34 per minute  Pulmonary/Chest: Effort normal. No respiratory distress. She has no wheezes. She has no rales.  Musculoskeletal: Normal range of motion. She exhibits no edema.  Lymphadenopathy:    She has no cervical adenopathy.  Neurological: She is alert and oriented to person, place, and time.  Skin: Skin is warm and dry. No rash noted. She is not diaphoretic. No erythema.  Psychiatric: She  has a normal mood and affect. Her behavior is normal. Judgment and thought content normal.   EKG: Bradycardia at 34 beats per minute. Because of the shortness of breath O2 was started on the patient and this seemed to help her shortness of breath and her weakness She was kept on the monitor while in the office and her rate stayed in the 30s, however shortly before leaving the rate was up in the low 50s.        Assessment & Plan:  1. Weak - EKG 12-Lead  2. Bradycardia - EKG 12-Lead  3. Atrial fibrillation  4. Hypertension  5. Osteoporosis  6. Tachycardia-bradycardia syndrome -I consulted with Dr. Daneen Schick, her  Cardiologist, and he agreed that admission would probably be better for the patient to see if she needed pacemaker insertion for this condition . The patient was transported to the hospital by her husband. Admission was arranged before she left the office.  Patient Instructions  Give to admissions is as planned Present recorded EKG to the admissions physician   Arrie Senate MD

## 2013-06-03 LAB — CBC
Platelets: 160 10*3/uL (ref 150–400)
RBC: 4.09 MIL/uL (ref 3.87–5.11)
WBC: 5.1 10*3/uL (ref 4.0–10.5)

## 2013-06-03 LAB — PROTIME-INR: Prothrombin Time: 18.9 seconds — ABNORMAL HIGH (ref 11.6–15.2)

## 2013-06-03 MED ORDER — PINDOLOL 5 MG PO TABS
2.5000 mg | ORAL_TABLET | Freq: Two times a day (BID) | ORAL | Status: DC
  Administered 2013-06-03 – 2013-06-04 (×3): 2.5 mg via ORAL
  Filled 2013-06-03 (×4): qty 1

## 2013-06-03 MED ORDER — WARFARIN SODIUM 5 MG PO TABS
5.0000 mg | ORAL_TABLET | Freq: Once | ORAL | Status: AC
  Administered 2013-06-03: 5 mg via ORAL
  Filled 2013-06-03: qty 1

## 2013-06-03 NOTE — Progress Notes (Signed)
ANTICOAGULATION CONSULT NOTE - Follow-up  Pharmacy Consult for warfarin Indication: atrial fibrillation  Allergies  Allergen Reactions  . Benazepril Hcl Cough    Vital Signs: Temp: 98.3 F (36.8 C) (07/17 0453) Temp src: Oral (07/17 0453) BP: 153/52 mmHg (07/17 0453) Pulse Rate: 51 (07/17 0453)  Labs:  Recent Labs  05/31/13 1148 06/02/13 1551 06/03/13 0410  HGB  --   --  12.5  HCT  --   --  37.6  PLT  --   --  160  LABPROT  --  19.7* 18.9*  INR  --  1.72* 1.63*  CREATININE 0.89  --   --     The CrCl is unknown because both a height and weight (above a minimum accepted value) are required for this calculation.  Assessment: 8 yof presented from MD office with symptomatic bradycardia, SOB and dizziness. She is on chronic coumadin for afib. Her INR is still slightly low today at 1.63 despite boosted dose yesterday. CBC is WNL, no bleeding noted.   Goal of Therapy:  INR 2-3   Plan:  1. Repeat coumadin 5mg  PO x 1 tonight (hesitant to boost dose further since pt had been very stable on her home regimen PTA) 2. F/u AM INR  Salome Arnt, PharmD, BCPS Pager # [phone] 06/03/2013 8:32 AM

## 2013-06-03 NOTE — Progress Notes (Signed)
UR Completed Sun Wilensky Graves-Bigelow, RN,BSN [phone]  

## 2013-06-03 NOTE — Progress Notes (Signed)
Patient had stated that she did not want the chest xray she had requested this Am to be ordered.  I notified Dr. Marlou Porch, who gave verbal order to D/C order.

## 2013-06-03 NOTE — Progress Notes (Addendum)
Subjective:  No complaints, no dizziness. Overnight Sinus Brady 50.   Objective:  Vital Signs in the last 24 hours: Temp:  [98.2 F (36.8 C)-98.7 F (37.1 C)] 98.3 F (36.8 C) (07/17 0453) Pulse Rate:  [48-55] 51 (07/17 0453) Resp:  [18] 18 (07/17 0453) BP: (145-156)/(46-78) 153/52 mmHg (07/17 0453) SpO2:  [95 %-97 %] 95 % (07/17 0453)  Intake/Output from previous day:     Physical Exam: General: Well developed, well nourished, in no acute distress. Head:  Normocephalic and atraumatic. Lungs: Clear to auscultation and percussion. Heart: Loletha Grayer Reg.  No murmur, rubs or gallops.  Abdomen: soft, non-tender, positive bowel sounds. Extremities: No clubbing or cyanosis. Trace LE edema. Neurologic: Alert and oriented x 3.    Lab Results:  Recent Labs  06/03/13 0410  WBC 5.1  HGB 12.5  PLT 160    Recent Labs  05/31/13 1148  NA 140  K 4.1  CL 100  CO2 32  GLUCOSE 67*  BUN 17  CREATININE 0.89     Telemetry: SBrady 50 Personally viewed.   EKG:  SB 51, first degree AVB  Cardiac Studies:  EF normal, moderate AI  Assessment/Plan:  Principal Problem:   Symptomatic bradycardia Active Problems:   Atrial fibrillation   Hypertension  79 year old with symptomatic bradycardia, PAF, moderate AI.  -currently sinus brady -EP notes reviewed -Continue with Flecainide 75mg  BID -Off of metoprolol 75mg  BID and digox 125. -? If symptoms are related mostly to chronotropic incompetence. -rib/flank pain mild after last week fall - check CXR. Off tele OK.  -Moderate AI. Had TEE in 2007 to assess (designated mild at the time). Could consider TEE in the future for further evaluation if symptoms do not improve.  -Ambulate today   Matheson Vandehei 06/03/2013, 8:46 AM

## 2013-06-03 NOTE — Progress Notes (Signed)
   ELECTROPHYSIOLOGY ROUNDING NOTE    Patient Name: Susan Fitzgerald Date of Encounter: 06/03/2013    SUBJECTIVE:Patient feels well this morning. No chest pain or shortness of breath.  Has not ambulated.    TELEMETRY: Reviewed telemetry pt in sinus bradycardia, rates 50's Filed Vitals:   06/02/13 1400 06/02/13 1932 06/03/13 0453  BP: 152/78 145/46 153/52  Pulse: 51 55 51  Temp: 98.4 F (36.9 C) 98.7 F (37.1 C) 98.3 F (36.8 C)  TempSrc: Oral Oral Oral  Resp: 18  18  SpO2: 97% 95% 95%   No intake or output data in the 24 hours ending 06/03/13 [id number]  CURRENT MEDICATIONS: . amLODipine  5 mg Oral Daily  . flecainide  75 mg Oral Q12H  . furosemide  20 mg Oral Daily  . potassium chloride  20 mEq Oral Daily  . sodium chloride  3 mL Intravenous Q12H  . sodium chloride  3 mL Intravenous Q12H  . Warfarin - Pharmacist Dosing Inpatient   Does not apply q1800    LABS: Basic Metabolic Panel:  Recent Labs  05/31/13 1148  NA 140  K 4.1  CL 100  CO2 32  GLUCOSE 67*  BUN 17  CREATININE 0.89  CALCIUM 9.2   CBC:  Recent Labs  06/03/13 0410  WBC 5.1  HGB 12.5  HCT 37.6  MCV 91.9  PLT 160   INR: 1.63  ECHO: 06-02-2013 EF 55-60%, mild concentric hypertrophy, moderate aortic regurgitation, LA 40  PHYSICAL EXAM Well developed and nourished in no acute distress HENT normal Neck supple with JVP-flat Clear Regular rate and rhythm, no murmurs or gallops Abd-soft with active BS No Clubbing cyanosis edema Skin-warm and dry A & Oriented  Grossly normal sensory and motor function    Principal Problem:   Symptomatic bradycardia Active Problems:   Atrial fibrillation   Hypertension   Bradycardia with PAF not clear as to cause of brady, by her history, it doesn't sound like post termination.  May just be sinus node dysfunction aggravated by meds  Would resume flecainide, use isa betablocker and discharge with 30 day monitor if tomorrrow/Sat on drug no further  bradycardia  Need to discuss NOACs with Dr Saundra Shelling

## 2013-06-04 LAB — PROTIME-INR: Prothrombin Time: 21.1 seconds — ABNORMAL HIGH (ref 11.6–15.2)

## 2013-06-04 MED ORDER — WARFARIN SODIUM 5 MG PO TABS
5.0000 mg | ORAL_TABLET | Freq: Once | ORAL | Status: DC
  Filled 2013-06-04: qty 1

## 2013-06-04 MED ORDER — FLECAINIDE ACETATE 150 MG PO TABS: 75.0000 mg | ORAL_TABLET | Freq: Two times a day (BID) | ORAL | Status: DC

## 2013-06-04 MED ORDER — PINDOLOL 5 MG PO TABS: 2.5000 mg | ORAL_TABLET | Freq: Two times a day (BID) | ORAL | Status: DC

## 2013-06-04 NOTE — Progress Notes (Signed)
ANTICOAGULATION CONSULT NOTE - Follow-up  Pharmacy Consult for warfarin Indication: atrial fibrillation  Allergies  Allergen Reactions  . Benazepril Hcl Cough    Vital Signs: Temp: 99.1 F (37.3 C) (07/18 0500) Temp src: Oral (07/18 0500) BP: 144/45 mmHg (07/18 0500) Pulse Rate: 66 (07/18 0500)  Labs:  Recent Labs  06/02/13 1551 06/03/13 0410 06/04/13 0445  HGB  --  12.5  --   HCT  --  37.6  --   PLT  --  160  --   LABPROT 19.7* 18.9* 21.1*  INR 1.72* 1.63* 1.89*    The CrCl is unknown because both a height and weight (above a minimum accepted value) are required for this calculation.  Assessment: 79 yof presented from MD office with symptomatic bradycardia, SOB and dizziness. She is on chronic coumadin for afib. Her INR is still slightly low today at 1.89 despite boosted dose in the past 2 days. CBC is WNL, no bleeding noted.   Goal of Therapy:  INR 2-3   Plan:  1. Coumadin 5mg  PO x 1 tonight which is her regular home dose, (probably will not need to boost dose further since pt had been very stable on her home regimen PTA) 2. F/u AM INR  Maryanna Shape, PharmD, BCPS  Clinical Pharmacist  Pager: [phone]   06/04/2013 8:54 AM

## 2013-06-04 NOTE — Progress Notes (Signed)
   ELECTROPHYSIOLOGY ROUNDING NOTE    Patient Name: Susan Fitzgerald Date of Encounter: 06/04/2013    SUBJECTIVE:Patient feels well this morning.  Ambulated yesterday without difficulty.  Heart rates improved into the 60-70's  TELEMETRY: Reviewed telemetry pt in sinus rhythm withno pauses  Hr in 50-60 Filed Vitals:   06/03/13 1305 06/03/13 2022 06/03/13 2105 06/04/13 0500  BP: 141/53 141/51  144/45  Pulse: 59 66  66  Temp: 97.5 F (36.4 C)  98.3 F (36.8 C) 99.1 F (37.3 C)  TempSrc: Oral  Oral Oral  Resp: 18 18  18   SpO2: 98% 95%  92%    Intake/Output Summary (Last 24 hours) at 06/04/13 0635 Last data filed at 06/03/13 1900  Gross per 24 hour  Intake    480 ml  Output      0 ml  Net    480 ml    CURRENT MEDICATIONS: . flecainide  75 mg Oral Q12H  . furosemide  20 mg Oral Daily  . pindolol  2.5 mg Oral BID  . potassium chloride  20 mEq Oral Daily  . sodium chloride  3 mL Intravenous Q12H  . sodium chloride  3 mL Intravenous Q12H  . Warfarin - Pharmacist Dosing Inpatient   Does not apply q1800    LABS: CBC:  Recent Labs  06/03/13 0410  WBC 5.1  HGB 12.5  HCT 37.6  MCV 91.9  PLT 160      PHYSICAL EXAM  Well developed and nourished in no acute distress HENT normal Neck supple with JVP-flat Clear Regular rate and rhythm, no murmurs or gallops Abd-soft with active BS No Clubbing cyanosis edema Skin-warm and dry A & Oriented  Grossly normal sensory and motor function     Principal Problem:   Symptomatic bradycardia Active Problems:   Atrial fibrillation   Hypertension   Consider discharge with 30 day recorder-- pt activated Can increase pindolol if necessary Would cardiovert for infrequent episodes of Afib instead of pursuing differetn drug options The q will be does her functional status normalize with sinus If nt would do GXT for assessment of chronotropic function

## 2013-06-04 NOTE — Discharge Summary (Signed)
Patient ID: Susan Fitzgerald MRN: [id number] DOB/AGE: [birth year] 79 y.o.  Admit date: 06/02/2013 Discharge date: 06/04/2013  Primary Discharge Diagnosis symptomatic bradycardia Secondary Discharge Diagnosis atrial fibrillation, hypertension, tachybradycardia syndrome  Significant Diagnostic Studies: cardiac graphics: Echocardiogram  Consults: Electrophysiology-Dr. [facility] [place] [facility] Course: 79 year old and has had atrial fibrillation. We have tried to maintain sinus rhythm. SHe has been on several different antiarrhythmics.   She had been on flecainide and went out of rhythm.we switched amiodarone. She did not tolerate amiodarone.  We then went back to a higher dose of flecainide because she continued to complain of fatigue like symptoms when out of rhythm. These symptoms did not improve when the amiodarone was stopped. She was restarted on a higher dose of flecainide. She underwent treadmill testing and had no arrhythmia.  She was at her doctor's office and had a heart rate in the 30s. She was sent to the emergency room. Her rate slowing drugs were stopped. She was added back on lower dose flecainide and low-dose pindolol. Her amlodipine was stopped. Dr. Caryl Comes was consulted. He felt chronotropic incompetence may have been the cause of her symptoms. She is being sent home with a monitor. We may pursue exercise treadmill testing in the future as well.   Discharge Exam: Blood pressure 144/45, pulse 66, temperature 99.1 F (37.3 C), temperature source Oral, resp. rate 18, SpO2 92.00%.   [place]/AT RRR, S1-S2 Clear to auscultation bilaterally Soft nontender No pedal edema, some discoloration of both legs  Labs:   Lab Results  Component Value Date   WBC 5.1 06/03/2013   HGB 12.5 06/03/2013   HCT 37.6 06/03/2013   MCV 91.9 06/03/2013   PLT 160 06/03/2013    Recent Labs Lab 05/31/13 1148  NA 140  K 4.1  CL 100  CO2 32  BUN 17  CREATININE 0.89  CALCIUM 9.2  GLUCOSE 67*   No results found  for this basename: CKTOTAL, CKMB, CKMBINDEX, TROPONINI    No results found for this basename: CHOL   No results found for this basename: HDL   Lab Results  Component Value Date   LDLCALC 64 04/14/2013   Lab Results  Component Value Date   TRIG 69 04/14/2013   No results found for this basename: CHOLHDL   No results found for this basename: LDLDIRECT      Radiology: Cardiomegaly on 04/21/2013 EKG: Normal sinus rhythm  FOLLOW UP [place]  Future Appointments Provider Department Dept Phone   06/21/2013 11:00 AM Mary-Margaret Hassell Done, [place] [phone]   06/21/2013 11:30 AM [facility] Pharmacist [place] [phone]       Medication List    STOP taking these medications       amLODipine 5 MG tablet  Commonly known as:  NORVASC     aspirin 81 MG tablet     digoxin 0.125 MG tablet  Commonly known as:  LANOXIN     metoprolol 50 MG tablet  Commonly known as:  LOPRESSOR      TAKE these medications       calcium-vitamin D 250-125 MG-UNIT per tablet  Commonly known as:  OSCAL WITH D  Take 1 tablet by mouth 2 (two) times daily.     flecainide 150 MG tablet  Commonly known as:  TAMBOCOR  Take 0.5 tablets (75 mg total) by mouth every 12 (twelve) hours.     furosemide 20 MG tablet  Commonly known as:  LASIX  Take 20 mg by  mouth daily.     multivitamin capsule  Take 1 capsule by mouth daily.     pindolol 5 MG tablet  Commonly known as:  VISKEN  Take 0.5 tablets (2.5 mg total) by mouth 2 (two) times daily.     potassium chloride SA 20 MEQ tablet  Commonly known as:  K-DUR,KLOR-CON  Take 20 mEq by mouth daily.     warfarin 5 MG tablet  Commonly known as:  COUMADIN  Take 2.5-5 mg by mouth daily. 5mg  Mon + Fri, 2.5mg  all other days           Follow-up Information   Follow up with Jettie Booze., MD In 4 weeks.   Contact information:   [place]  [address] [phone]       BRING ALL MEDICATIONS WITH YOU TO FOLLOW UP APPOINTMENTS  Time spent with patient to include physician time: 25 minutes Signed: Parley Pidcock S. 06/04/2013, 9:40 AM

## 2013-06-07 ENCOUNTER — Ambulatory Visit (INDEPENDENT_AMBULATORY_CARE_PROVIDER_SITE_OTHER): Payer: Medicare Other | Admitting: Pharmacist

## 2013-06-07 ENCOUNTER — Encounter: Payer: Self-pay | Admitting: Pharmacist

## 2013-06-07 ENCOUNTER — Telehealth: Payer: Self-pay | Admitting: Nurse Practitioner

## 2013-06-07 VITALS — BP 136/64 | HR 64

## 2013-06-07 DIAGNOSIS — R300 Dysuria: Secondary | ICD-10-CM

## 2013-06-07 DIAGNOSIS — I4891 Unspecified atrial fibrillation: Secondary | ICD-10-CM

## 2013-06-07 DIAGNOSIS — N390 Urinary tract infection, site not specified: Secondary | ICD-10-CM

## 2013-06-07 LAB — POCT INR: INR: 2.1

## 2013-06-07 LAB — POCT URINALYSIS DIPSTICK
Leukocytes, UA: NEGATIVE
Nitrite, UA: NEGATIVE
Urobilinogen, UA: NEGATIVE
pH, UA: 6.5

## 2013-06-07 LAB — POCT UA - MICROSCOPIC ONLY
Casts, Ur, LPF, POC: NEGATIVE
Crystals, Ur, HPF, POC: NEGATIVE
Yeast, UA: NEGATIVE

## 2013-06-07 NOTE — Telephone Encounter (Signed)
appt given  

## 2013-06-09 ENCOUNTER — Other Ambulatory Visit: Payer: Self-pay | Admitting: *Deleted

## 2013-06-09 DIAGNOSIS — N390 Urinary tract infection, site not specified: Secondary | ICD-10-CM

## 2013-06-09 LAB — URINE CULTURE: Colony Count: >=100000

## 2013-06-09 MED ORDER — CIPROFLOXACIN HCL 500 MG PO TABS: 500.0000 mg | ORAL_TABLET | Freq: Two times a day (BID) | ORAL | Status: DC

## 2013-06-10 ENCOUNTER — Telehealth: Payer: Self-pay | Admitting: Pharmacist

## 2013-06-10 NOTE — Telephone Encounter (Signed)
appt for 06/15/13 to recheck protime since start cipro.  Left message about appt on patient's VM

## 2013-06-15 ENCOUNTER — Ambulatory Visit (INDEPENDENT_AMBULATORY_CARE_PROVIDER_SITE_OTHER): Payer: Medicare Other | Admitting: Pharmacist Clinician (PhC)/ Clinical Pharmacy Specialist

## 2013-06-15 DIAGNOSIS — I4891 Unspecified atrial fibrillation: Secondary | ICD-10-CM

## 2013-06-21 ENCOUNTER — Ambulatory Visit (INDEPENDENT_AMBULATORY_CARE_PROVIDER_SITE_OTHER): Payer: Medicare Other | Admitting: Nurse Practitioner

## 2013-06-21 ENCOUNTER — Encounter: Payer: Self-pay | Admitting: Nurse Practitioner

## 2013-06-21 VITALS — BP 134/64 | HR 57 | Temp 97.9°F | Ht 61.25 in | Wt 128.0 lb

## 2013-06-21 DIAGNOSIS — Z124 Encounter for screening for malignant neoplasm of cervix: Secondary | ICD-10-CM

## 2013-06-21 DIAGNOSIS — Z01419 Encounter for gynecological examination (general) (routine) without abnormal findings: Secondary | ICD-10-CM

## 2013-06-21 NOTE — Progress Notes (Signed)
  Subjective:    Patient ID: Susan Fitzgerald, female    DOB: Mar 21, 1932, 79 y.o.   MRN: [id number]  HPI  Patient is a regular patient of Dr. Laurance Flatten- She is here today for PAP and breast exam- She is doing well with no complaints.   Review of Systems  All other systems reviewed and are negative.       Objective:   Physical Exam  Constitutional: She is oriented to person, place, and time. She appears well-developed and well-nourished.  HENT:  Head: Normocephalic.  Right Ear: Hearing, tympanic membrane, external ear and ear canal normal.  Left Ear: Hearing, tympanic membrane, external ear and ear canal normal.  Nose: Nose normal.  Mouth/Throat: Uvula is midline and oropharynx is clear and moist.  Eyes: Conjunctivae and EOM are normal. Pupils are equal, round, and reactive to light.  Neck: Normal range of motion and full passive range of motion without pain. Neck supple. No JVD present. Carotid bruit is not present. No mass and no thyromegaly present.  Cardiovascular: Normal rate, normal heart sounds and intact distal pulses.   No murmur heard. Pulmonary/Chest: Effort normal and breath sounds normal. Right breast exhibits no inverted nipple, no mass, no nipple discharge, no skin change and no tenderness. Left breast exhibits no inverted nipple, no mass, no nipple discharge, no skin change and no tenderness.  Abdominal: Soft. Bowel sounds are normal. She exhibits no mass. There is no tenderness.  Genitourinary: Vagina normal and uterus normal. Guaiac negative stool. No breast swelling, tenderness, discharge or bleeding.  bimanual exam-No adnexal masses or tenderness.  Cervix parous and pink  Musculoskeletal: Normal range of motion.  Lymphadenopathy:    She has no cervical adenopathy.  Neurological: She is alert and oriented to person, place, and time.  Skin: Skin is warm and dry.  bill lower ext circulatory skin changes  Psychiatric: She has a normal mood and affect. Her behavior is  normal. Judgment and thought content normal.    BP 134/64  Pulse 57  Temp(Src) 97.9 F (36.6 C) (Oral)  Ht 5' 1.25" (1.556 m)  Wt 128 lb (58.06 kg)  BMI 23.98 kg/m2       Assessment & Plan:  1. Encounter for routine gynecological examination Keep follow up appointment with Dr. Laurance Flatten - Pap IG (Image Guided)  Mary-Margaret Hassell Done, FNP

## 2013-06-21 NOTE — Patient Instructions (Signed)

## 2013-06-23 LAB — PAP IG (IMAGE GUIDED): PAP Smear Comment: 0

## 2013-07-12 LAB — HM DIABETES EYE EXAM

## 2013-07-16 ENCOUNTER — Telehealth: Payer: Self-pay | Admitting: Family Medicine

## 2013-07-16 NOTE — Telephone Encounter (Signed)
Spoke to patient and she has decided to keep appt for 07/26/13 which I think would be OK

## 2013-07-20 ENCOUNTER — Telehealth: Payer: Self-pay | Admitting: Pharmacist Clinician (PhC)/ Clinical Pharmacy Specialist

## 2013-07-20 ENCOUNTER — Other Ambulatory Visit (INDEPENDENT_AMBULATORY_CARE_PROVIDER_SITE_OTHER): Payer: Medicare Other | Admitting: Pharmacist Clinician (PhC)/ Clinical Pharmacy Specialist

## 2013-07-20 DIAGNOSIS — I4891 Unspecified atrial fibrillation: Secondary | ICD-10-CM

## 2013-07-20 NOTE — Telephone Encounter (Signed)
Susan Fitzgerald called on 8/29 from ECG that patient's INR was 1.4.  He increased her warfarin to 5mg  qd with 2.5mg  tuesdays, thursdays, and fridays.  Three medications were stopped:  Amlodipine and the other two I could not make out on his message will ask patient when she comes for appointment.  She was scheduled for a re-check today.

## 2013-07-26 ENCOUNTER — Ambulatory Visit (INDEPENDENT_AMBULATORY_CARE_PROVIDER_SITE_OTHER): Payer: Medicare Other | Admitting: Pharmacist

## 2013-07-26 DIAGNOSIS — I4891 Unspecified atrial fibrillation: Secondary | ICD-10-CM

## 2013-07-26 LAB — POCT INR: INR: 4.3

## 2013-07-26 NOTE — Patient Instructions (Addendum)
Anticoagulation Dose Instructions as of 07/26/2013     Sun Mon Tue Wed Thu Fri Sat   New Dose 2.5 mg 5 mg 2.5 mg 5 mg 2.5 mg 5 mg 2.5 mg    Description       Hold for 1 day then start 1 tablet Mondays, Wednesdays and Fridays.  1/2 tablet on sundays, tuesdays, thursdays and saturdays. Sending out INR for verification.       INR was 4.3 today

## 2013-07-27 LAB — PROTIME-INR: INR: 1.6 — ABNORMAL HIGH (ref 0.8–1.2)

## 2013-07-28 ENCOUNTER — Ambulatory Visit (INDEPENDENT_AMBULATORY_CARE_PROVIDER_SITE_OTHER): Payer: Medicare Other | Admitting: Pharmacist

## 2013-07-28 ENCOUNTER — Telehealth: Payer: Self-pay | Admitting: Pharmacist

## 2013-07-28 DIAGNOSIS — I4891 Unspecified atrial fibrillation: Secondary | ICD-10-CM

## 2013-07-28 LAB — POCT INR: INR: 1.3

## 2013-07-28 NOTE — Patient Instructions (Addendum)
Anticoagulation Dose Instructions as of 07/28/2013     Dorene Grebe Tue Wed Thu Fri Sat   New Dose 5 mg 2.5 mg 5 mg 5 mg 5 mg 2.5 mg 5 mg    Description       Take 7.5mg  today (= 1 and 1/2) tablets then start 1/2 tablet on Mondays and Fridays and 1 tablet all other days. Sending out INR for verification.      INR was 1.3 today

## 2013-07-29 ENCOUNTER — Telehealth: Payer: Self-pay | Admitting: Pharmacist

## 2013-07-29 LAB — PROTIME-INR
INR: 1.3 — ABNORMAL HIGH (ref 0.8–1.2)
Prothrombin Time: 13.8 s — ABNORMAL HIGH (ref 9.1–12.0)

## 2013-07-29 NOTE — Telephone Encounter (Signed)
INR that was drawn was same as POC INR today.  Continue warfarin dosing as discussed yesterday.

## 2013-08-06 ENCOUNTER — Other Ambulatory Visit (INDEPENDENT_AMBULATORY_CARE_PROVIDER_SITE_OTHER): Payer: Medicare Other

## 2013-08-06 DIAGNOSIS — I4891 Unspecified atrial fibrillation: Secondary | ICD-10-CM

## 2013-08-08 ENCOUNTER — Ambulatory Visit (INDEPENDENT_AMBULATORY_CARE_PROVIDER_SITE_OTHER): Payer: Self-pay | Admitting: Pharmacist

## 2013-08-08 DIAGNOSIS — I4891 Unspecified atrial fibrillation: Secondary | ICD-10-CM

## 2013-08-08 NOTE — Progress Notes (Signed)
Charge for protime only.  This encounter was for recording only.

## 2013-08-16 ENCOUNTER — Other Ambulatory Visit: Payer: Self-pay | Admitting: *Deleted

## 2013-08-16 DIAGNOSIS — M810 Age-related osteoporosis without current pathological fracture: Secondary | ICD-10-CM

## 2013-08-16 DIAGNOSIS — R001 Bradycardia, unspecified: Secondary | ICD-10-CM

## 2013-08-16 DIAGNOSIS — I10 Essential (primary) hypertension: Secondary | ICD-10-CM

## 2013-08-16 DIAGNOSIS — I4891 Unspecified atrial fibrillation: Secondary | ICD-10-CM

## 2013-08-19 ENCOUNTER — Telehealth: Payer: Self-pay | Admitting: Interventional Cardiology

## 2013-08-19 MED ORDER — LISINOPRIL 20 MG PO TABS: 20.0000 mg | ORAL_TABLET | Freq: Every day | ORAL | Status: DC

## 2013-08-19 NOTE — Telephone Encounter (Signed)
Spoke with pt and she took her BP at 1:00pm and it was 172/85. Pt will continue the increased dosage of the lisinopril 20mg  and she will keep an eye on her BP. She will let us know if BP coninues to stay consistently elevated about 140/90.

## 2013-08-19 NOTE — Telephone Encounter (Signed)
Pt states BP now 208/103 at 1030am. Per Dr. Hassell Done recommendation to increase lisinopril to 20mg  daily. Pt advised to check BP in about 3 hrs and I will call her back today to check on her.

## 2013-08-19 NOTE — Telephone Encounter (Signed)
Relax and avoid any strenuous activity.  Can recheck BP later in the day.  OK to increase lisinopril to 20 mg daily if BP stays high.

## 2013-08-19 NOTE — Telephone Encounter (Signed)
Spoke with pt and pt called her PCP Dr. Laurance Flatten this am because her BP was 214/81. Dr. Laurance Flatten suggested that pt take another 10mg  of lisinopril (which she did) and pts BP is now 225/91.

## 2013-08-19 NOTE — Telephone Encounter (Signed)
New Problem  Pt was recently seen for BP problems and she states that her BP is still high. She asks to have her lisinopril dosage increased. Please advise.

## 2013-08-20 ENCOUNTER — Other Ambulatory Visit: Payer: Self-pay | Admitting: Cardiology

## 2013-08-20 MED ORDER — LISINOPRIL 20 MG PO TABS: 40.0000 mg | ORAL_TABLET | Freq: Every day | ORAL | Status: DC

## 2013-08-20 NOTE — Telephone Encounter (Signed)
New problem:  Pt is calling back to speak with Amy. Pt states her BP problem is not any better. Pt states her BP was 194/78 at 3:15pm today. Pt states she would like information on  bystolic as a possible drug for her BP

## 2013-08-20 NOTE — Telephone Encounter (Signed)
To Dr. Varanasi, please advise.  

## 2013-08-20 NOTE — Telephone Encounter (Signed)
Pt notified. Pt will increase lisinopril to 40mg  daily. Meds updated.

## 2013-08-20 NOTE — Telephone Encounter (Signed)
Could increase lisinopril to 40 mg daily.  Pindolol is a beta blocker like bystolic.  Bystolic is a better BP lowering drug, but it is expensive. I am ok with either switching pindolol to bystolic, or increasing lisinopril.

## 2013-08-23 ENCOUNTER — Ambulatory Visit (INDEPENDENT_AMBULATORY_CARE_PROVIDER_SITE_OTHER): Payer: Medicare Other | Admitting: Pharmacist

## 2013-08-23 ENCOUNTER — Encounter: Payer: Self-pay | Admitting: Interventional Cardiology

## 2013-08-23 VITALS — BP 220/82 | HR 60

## 2013-08-23 DIAGNOSIS — I4891 Unspecified atrial fibrillation: Secondary | ICD-10-CM

## 2013-08-23 DIAGNOSIS — R001 Bradycardia, unspecified: Secondary | ICD-10-CM

## 2013-08-23 DIAGNOSIS — M810 Age-related osteoporosis without current pathological fracture: Secondary | ICD-10-CM

## 2013-08-23 DIAGNOSIS — I10 Essential (primary) hypertension: Secondary | ICD-10-CM

## 2013-08-23 DIAGNOSIS — Z23 Encounter for immunization: Secondary | ICD-10-CM

## 2013-08-23 LAB — POCT INR: INR: 2.3

## 2013-08-23 NOTE — Patient Instructions (Signed)
Anticoagulation Dose Instructions as of 08/23/2013     Susan Fitzgerald Tue Wed Thu Fri Sat   New Dose 5 mg 2.5 mg 5 mg 5 mg 5 mg 2.5 mg 5 mg    Description       Continue 1/2 tablet on Mondays and Fridays and 1 tablet all other days.       INR was 2.3 today

## 2013-08-23 NOTE — Progress Notes (Signed)
Took amlodipine 5mg  1/2 tablet last night and BP dropped to 160/60  Heart rate was 61. Recommend continue amlodipine 5mg  1/2 tablet daily - monitor for swlling.

## 2013-08-24 ENCOUNTER — Encounter: Payer: Self-pay | Admitting: Interventional Cardiology

## 2013-08-24 ENCOUNTER — Telehealth: Payer: Self-pay | Admitting: Cardiology

## 2013-08-24 ENCOUNTER — Other Ambulatory Visit (INDEPENDENT_AMBULATORY_CARE_PROVIDER_SITE_OTHER): Payer: Medicare Other

## 2013-08-24 DIAGNOSIS — I4891 Unspecified atrial fibrillation: Secondary | ICD-10-CM

## 2013-08-24 NOTE — Telephone Encounter (Signed)
Pt was off amlodipine but she started back taking 2.5mg  on Sunday per PCP on call. This am BP ws 179/76 and at Dr. Tawanna Sat it was over [id number] systolic. Pt states Dr. Jenkins Rouge comes to [facility] 2-3 times month and would like to switch to Dr. Jenkins Rouge. FYI to Dr. Irish Lack pt will stay on amlodipine 2.5 mg daily, lisinopril 40mg  1 tab po daily and also Dr. Laurance Flatten told pt to increase to Pindolol to 5mg  BID.

## 2013-08-24 NOTE — Telephone Encounter (Signed)
Per Dr. Irish Lack pt should add felodipine 10mg  1 tab po daily. Also, make sure pt stopped amlodipine.

## 2013-08-25 LAB — BMP8+EGFR
Chloride: 101 mmol/L (ref 97–108)
GFR calc Af Amer: 72 mL/min/{1.73_m2} (ref >59)
GFR calc non Af Amer: 63 mL/min/{1.73_m2} (ref >59)
Glucose: 85 mg/dL (ref 65–99)
Potassium: 4.7 mmol/L (ref 3.5–5.2)
Sodium: 144 mmol/L (ref 134–144)

## 2013-08-26 ENCOUNTER — Telehealth: Payer: Self-pay | Admitting: Family Medicine

## 2013-08-27 NOTE — Telephone Encounter (Signed)
DONE

## 2013-09-01 ENCOUNTER — Ambulatory Visit (INDEPENDENT_AMBULATORY_CARE_PROVIDER_SITE_OTHER): Payer: Medicare Other | Admitting: Pharmacist

## 2013-09-01 ENCOUNTER — Encounter: Payer: Self-pay | Admitting: Pharmacist

## 2013-09-01 ENCOUNTER — Encounter (INDEPENDENT_AMBULATORY_CARE_PROVIDER_SITE_OTHER): Payer: Self-pay

## 2013-09-01 VITALS — BP 158/72 | HR 58

## 2013-09-01 DIAGNOSIS — I10 Essential (primary) hypertension: Secondary | ICD-10-CM

## 2013-09-01 NOTE — Progress Notes (Signed)
Patient ID: Susan Fitzgerald, female   DOB: 1932/10/14, 79 y.o.   MRN: [id number] Subjective:    Patient here for follow-up of elevated blood pressure.  She is exercising and is mostly (eats out about 2-3 times per week) adherent to a low-salt diet.  Blood pressure is currently variable  at home. Cardiac symptoms: lower extremity edema. Patient denies: chest pain, chest pressure/discomfort, claudication, dyspnea, exertional chest pressure/discomfort, fatigue, irregular heart beat, near-syncope, orthopnea, palpitations, paroxysmal nocturnal dyspnea, syncope and tachypnea. Cardiovascular risk factors: advanced age (older than 15 for men, 80 for women) and hypertension. Use of agents associated with hypertension: none. History of target organ damage: none.  The following portions of the patient's history were reviewed and updated as appropriate: allergies, current medications, past family history, past medical history, past social history, past surgical history and problem list.     Objective:   Filed Vitals:   09/01/13 1450  BP: 158/72  Pulse: 58     Assessment:    Hypertension, uncontrolled but improving .    Plan:    Medication: continue amlodipine 2.5mg  daily, lisinopril 40mg  daily and pindolol 5mg  BID. Dietary sodium restriction. Regular aerobic exercise. Check blood pressures one times daily and record. Follow up: 2 weeks and as needed.   Also will order an ambulatory BP for 24 hours - discussed with Dr Laurance Flatten and he agrees that since patient's BP is so variable ambulatory BP would be useful.  Cherre Robins, PharmD, CPP

## 2013-09-02 ENCOUNTER — Encounter: Payer: Self-pay | Admitting: *Deleted

## 2013-09-06 NOTE — Progress Notes (Addendum)
24 hr bp monitor started on patient on 09/02/2013. Pt verbalizes understanding and will return with machine tomorrow. Per order of Dr Laurance Flatten. Pts bp has been very liable at home. Pt returned monitor in 09/03/2013 and results printed. Results to Dr. Laurance Flatten. 09/07/2013 Pt notified of 24 hr bp results. No changes in medication at this time .Will have Dr. Percival Spanish review at patients office visit.

## 2013-09-08 ENCOUNTER — Telehealth: Payer: Self-pay | Admitting: Family Medicine

## 2013-09-08 MED ORDER — PINDOLOL 5 MG PO TABS: 5.0000 mg | ORAL_TABLET | Freq: Two times a day (BID) | ORAL | Status: DC

## 2013-09-08 NOTE — Telephone Encounter (Signed)
RX SENT TO PHARM AND PT IS AWARE.

## 2013-09-20 ENCOUNTER — Encounter (INDEPENDENT_AMBULATORY_CARE_PROVIDER_SITE_OTHER): Payer: Self-pay

## 2013-09-20 ENCOUNTER — Encounter: Payer: Self-pay | Admitting: Cardiology

## 2013-09-20 ENCOUNTER — Ambulatory Visit (INDEPENDENT_AMBULATORY_CARE_PROVIDER_SITE_OTHER): Payer: Medicare Other | Admitting: Cardiology

## 2013-09-20 VITALS — BP 164/82 | HR 61 | Ht 62.5 in | Wt 127.0 lb

## 2013-09-20 DIAGNOSIS — R0989 Other specified symptoms and signs involving the circulatory and respiratory systems: Secondary | ICD-10-CM

## 2013-09-20 DIAGNOSIS — I10 Essential (primary) hypertension: Secondary | ICD-10-CM

## 2013-09-20 DIAGNOSIS — R001 Bradycardia, unspecified: Secondary | ICD-10-CM

## 2013-09-20 DIAGNOSIS — I498 Other specified cardiac arrhythmias: Secondary | ICD-10-CM

## 2013-09-20 DIAGNOSIS — I4891 Unspecified atrial fibrillation: Secondary | ICD-10-CM

## 2013-09-20 MED ORDER — LISINOPRIL 20 MG PO TABS: 40.0000 mg | ORAL_TABLET | Freq: Every day | ORAL | Status: DC

## 2013-09-20 NOTE — Progress Notes (Signed)
HPI The patient she presents for evaluation of atrial fibrillation. She wants to move her care to me so that I can see in [place].  She has seen Dr. Irish Lack and Dr. Caryl Comes.  She has had problems with atrial fibrillation and more recently this summer bradycardia. However, she is actually doing quite well without any recurrent symptomatic atrial fibrillation or bradycardia arrhythmias. This has been on the current dose of flecainide. She does some golfing and some household chores but she doesn't exercise routinely. However, with her activity she denies any acute cardiovascular symptoms. The patient denies any new symptoms such as chest discomfort, neck or arm discomfort. There has been no new shortness of breath, PND or orthopnea. There have been no reported palpitations, presyncope or syncope.   She has had difficult to control hypertension. I reviewed a 24 hour before a blood pressure monitor that she wore. iit shows that the average of her blood pressure is probably about 150 although it does spike at times.  It goes down very quickly when it does go up.   Allergies  Allergen Reactions  . Benazepril Hcl Cough    Current Outpatient Prescriptions  Medication Sig Dispense Refill  . calcium-vitamin D (OSCAL WITH D) 250-125 MG-UNIT per tablet Take 1 tablet by mouth 2 (two) times daily.      . flecainide (TAMBOCOR) 150 MG tablet Take 0.5 tablets (75 mg total) by mouth every 12 (twelve) hours.  30 tablet  11  . furosemide (LASIX) 20 MG tablet Take 20 mg by mouth daily.      Marland Kitchen lisinopril (PRINIVIL,ZESTRIL) 20 MG tablet Take 2 tablets (40 mg total) by mouth daily.  60 tablet  4  . Multiple Vitamin (MULTIVITAMIN) capsule Take 1 capsule by mouth daily.      . pindolol (VISKEN) 5 MG tablet Take 1 tablet (5 mg total) by mouth 2 (two) times daily.  60 tablet  1  . potassium chloride SA (K-DUR,KLOR-CON) 20 MEQ tablet Take 20 mEq by mouth daily.      Marland Kitchen warfarin (COUMADIN) 5 MG tablet Take 2.5-5 mg by mouth  daily. 5mg  Mon + Fri, 2.5mg  all other days       No current facility-administered medications for this visit.    Past Medical History  Diagnosis Date  . Menopause   . A-fib     04/09/13-nuclear stress-no ischemia low risk  . Hypertension   . Osteoporosis   . Diverticulosis   . Cataract     Past Surgical History  Procedure Laterality Date  . Cataract extraction    . Biopsy breast      ROS:  .As stated in the HPI and negative for all other systems.  PHYSICAL EXAM BP 164/82  Pulse 61  Ht 5' 2.5" (1.588 m)  Wt 127 lb (57.607 kg)  BMI 22.84 kg/m2 GENERAL:  Well appearing HEENT:  Pupils equal round and reactive, fundi not visualized, oral mucosa unremarkable NECK:  No jugular venous distention, waveform within normal limits, carotid upstroke brisk and symmetric, no bruits, no thyromegaly LYMPHATICS:  No cervical, inguinal adenopathy LUNGS:  Clear to auscultation bilaterally BACK:  No CVA tenderness CHEST:  Unremarkable HEART:  PMI not displaced or sustained,S1 and S2 within normal limits, no S3, no S4, no clicks, no rubs, no murmurs ABD:  Flat, positive bowel sounds normal in frequency in pitch, positive midline bruits, no rebound, no guarding, no midline pulsatile mass, no hepatomegaly, no splenomegaly EXT:  2 plus pulses throughout, no edema,  no cyanosis no clubbing SKIN:  No rashes no nodules NEURO:  Cranial nerves II through XII grossly intact, motor grossly intact throughout PSYCH:  Cognitively intact, oriented to person place and time   EKG:  Sinus rhythm, rate 61, left axis deviation, poor anterior R wave progression, no acute ST-T wave changes.  09/20/2013  ASSESSMENT AND PLAN   HTN:  We discussed her labile blood pressure. At this point I would not change her medications. If in the future she has blood pressure spikes then don't come down as quickly as they have been I might give her when necessary clonidine to use.  ABDOMINAL BRUIT:  I will check an abdominal  ultrasound.  ATRIAL FIB:  She is tolerating the medications as listed. I will check a flecainide level when she comes back. Continues with anticoagulation.  BRADYCARDIA:  This has not been an issue. No change in therapy is indicated.

## 2013-09-20 NOTE — Patient Instructions (Addendum)
The current medical regimen is effective;  continue present plan and medications.  Your physician has requested that you have an abdominal aorta duplex. During this test, an ultrasound is used to evaluate the aorta. Allow 30 minutes for this exam. Do not eat after midnight the day before and avoid carbonated beverages  Please have Flecainide level drawn same day as your abdominal ultrasound.  Do not take your Flecainide this am (before your blood work).  Follow up in 6 months with Dr Percival Spanish in [place].  You will receive a letter in the mail 2 months before you are due.  Please call us when you receive this letter to schedule your follow up appointment.

## 2013-09-27 ENCOUNTER — Ambulatory Visit (INDEPENDENT_AMBULATORY_CARE_PROVIDER_SITE_OTHER): Payer: Medicare Other | Admitting: Pharmacist

## 2013-09-27 ENCOUNTER — Encounter: Payer: Self-pay | Admitting: Pharmacist

## 2013-09-27 VITALS — BP 140/62 | HR 65

## 2013-09-27 DIAGNOSIS — I4891 Unspecified atrial fibrillation: Secondary | ICD-10-CM

## 2013-09-27 NOTE — Patient Instructions (Signed)
Anticoagulation Dose Instructions as of 09/27/2013     Susan Fitzgerald Tue Wed Thu Fri Sat   New Dose 5 mg 2.5 mg 5 mg 5 mg 5 mg 2.5 mg 5 mg    Description       Take 1 tablet today instead of usual 1/2 tablet, then restart regular dose of 1/2 tablet on Mondays and Fridays and 1 tablet all other days.       INR was 1.8 today

## 2013-09-29 ENCOUNTER — Encounter: Payer: Self-pay | Admitting: Cardiology

## 2013-09-29 ENCOUNTER — Other Ambulatory Visit: Payer: Medicare Other

## 2013-09-29 ENCOUNTER — Ambulatory Visit (HOSPITAL_COMMUNITY): Payer: Medicare Other | Attending: Cardiology

## 2013-09-29 DIAGNOSIS — I10 Essential (primary) hypertension: Secondary | ICD-10-CM | POA: Insufficient documentation

## 2013-09-29 DIAGNOSIS — R0989 Other specified symptoms and signs involving the circulatory and respiratory systems: Secondary | ICD-10-CM | POA: Insufficient documentation

## 2013-09-29 DIAGNOSIS — I4891 Unspecified atrial fibrillation: Secondary | ICD-10-CM

## 2013-11-01 ENCOUNTER — Telehealth: Payer: Self-pay | Admitting: Family Medicine

## 2013-11-01 ENCOUNTER — Ambulatory Visit (INDEPENDENT_AMBULATORY_CARE_PROVIDER_SITE_OTHER): Payer: Medicare Other | Admitting: Pharmacist

## 2013-11-01 VITALS — BP 150/78 | HR 68

## 2013-11-01 DIAGNOSIS — I4891 Unspecified atrial fibrillation: Secondary | ICD-10-CM

## 2013-11-01 MED ORDER — PINDOLOL 5 MG PO TABS: 5.0000 mg | ORAL_TABLET | Freq: Two times a day (BID) | ORAL | Status: DC

## 2013-11-01 NOTE — Patient Instructions (Signed)
Anticoagulation Dose Instructions as of 11/01/2013     Susan Fitzgerald Tue Wed Thu Fri Sat   New Dose 5 mg 2.5 mg 5 mg 5 mg 5 mg 2.5 mg 5 mg    Description       Start new dose of 1/2 tablet on Fridays and 1 tablet all other days.       INR was 1.7 today

## 2013-11-01 NOTE — Telephone Encounter (Signed)
Wanted me to know she was taken off ASA 81mg  daily in July while in the hospital.  Already noted previously.

## 2013-11-15 ENCOUNTER — Telehealth: Payer: Self-pay | Admitting: *Deleted

## 2013-11-15 NOTE — Telephone Encounter (Signed)
Ins co will not cover pindolol until ALL medications in tier 2 are tried they include 1. Afeditab CR, 2. Chlorothiazide, 3. Chlorthalidone,4. indapanide, 5. Labetalol, 6. Nifedical XL, 7. NifedipineER. 8. Propranolol, 9. Propranolol CR,10. Verapamil, 11. verapamilCr 12. VerapamilER, 13.verapamil SA, verapamil SR, the tier 1 med s include atenolol and carvedilol.

## 2013-11-19 ENCOUNTER — Ambulatory Visit (INDEPENDENT_AMBULATORY_CARE_PROVIDER_SITE_OTHER): Payer: Medicare Other | Admitting: Pharmacist

## 2013-11-19 ENCOUNTER — Encounter: Payer: Self-pay | Admitting: Pharmacist

## 2013-11-19 VITALS — BP 152/72 | HR 74

## 2013-11-19 DIAGNOSIS — I4891 Unspecified atrial fibrillation: Secondary | ICD-10-CM

## 2013-11-19 LAB — POCT INR: INR: 1.9

## 2013-11-19 NOTE — Patient Instructions (Signed)
Anticoagulation Dose Instructions as of 11/19/2013     Dorene Grebe Tue Wed Thu Fri Sat   New Dose 5 mg 5 mg 5 mg 5 mg 5 mg 2.5 mg 5 mg    Description       Take a whole tablet instead of 1/2 today (Friday 11/19/13), then restart of 1/2 tablet on Fridays and 1 tablet all other days.       INR was 1.9 today

## 2013-11-22 ENCOUNTER — Telehealth: Payer: Self-pay | Admitting: Pharmacist

## 2013-11-23 MED ORDER — LISINOPRIL 20 MG PO TABS: 40.0000 mg | ORAL_TABLET | Freq: Every day | ORAL | Status: DC

## 2013-11-23 NOTE — Telephone Encounter (Signed)
done

## 2013-12-01 ENCOUNTER — Telehealth: Payer: Self-pay | Admitting: Cardiology

## 2013-12-01 NOTE — Telephone Encounter (Signed)
Pindolol is not longer preferred starting 11/18/13 per her insurance.  We have tried to appeal for lower copay (would be $95/month) but was denies.  Patient had pindolol filled 10/2013 #90 (last time allowed) and will last until March so we have a little time.  I left her Cardiologist a message (and will also forward this message to him) to discuss possible alternatives to pindolol (see note).  Susan Fitzgerald has hospitalized with bradycardia related to medications 05/2013.  She would like Dr Jenkins Rouge or Dr Caryl Comes to be involved with decision to changed beta blocker.

## 2013-12-01 NOTE — Telephone Encounter (Signed)
New message   Patient insurance is not longer covering  pindolol.  Please advise.

## 2013-12-01 NOTE — Telephone Encounter (Signed)
Spoke with Tammy - they have tried to obtain prior approval for this medication however it was denied.  She will fax over a list of medication the insurance will cover.  Aware I will have Dr Percival Spanish to review and give orders.  Pindolol is going to cost $95 and the pt can not afford it.

## 2013-12-02 NOTE — Progress Notes (Signed)
Patient came in for labs only.

## 2013-12-05 NOTE — Telephone Encounter (Signed)
She can stop the pindolol when she runs out.  She seems to be maintaining NSR.

## 2013-12-06 NOTE — Telephone Encounter (Signed)
Left message for pt one her voicemail to stop Pindolol once she is out of medication.  Requested she call back with any questions or concerns.

## 2013-12-06 NOTE — Telephone Encounter (Signed)
Pt is aware per Dr Percival Spanish she should stop the Pindolol when she runs out of the current supply that she has.  She however does not want to stop this medication and request Dr Percival Spanish to write a letter for prior approval stating that she is benefiting from the medication and needs to continue on it.  The letter should be sent to [place], [place] [address].  Her insurance ID number is [id number].  She is aware I will need an order from Dr Percival Spanish to continue this medication and complete the letter since he did give orders to d/c after her current supply.

## 2013-12-06 NOTE — Telephone Encounter (Signed)
Follow up     Returning nurse call .

## 2013-12-07 ENCOUNTER — Telehealth: Payer: Self-pay | Admitting: Family Medicine

## 2013-12-07 NOTE — Telephone Encounter (Signed)
OK to write letter to see if they will continue this.

## 2013-12-08 MED ORDER — FLECAINIDE ACETATE 150 MG PO TABS: 75.0000 mg | ORAL_TABLET | Freq: Two times a day (BID) | ORAL | Status: DC

## 2013-12-08 NOTE — Telephone Encounter (Signed)
rx sent to pharmacy and patient notified 

## 2013-12-13 ENCOUNTER — Ambulatory Visit (INDEPENDENT_AMBULATORY_CARE_PROVIDER_SITE_OTHER): Payer: Medicare Other | Admitting: Pharmacist

## 2013-12-13 VITALS — BP 142/68

## 2013-12-13 DIAGNOSIS — I4891 Unspecified atrial fibrillation: Secondary | ICD-10-CM

## 2013-12-13 LAB — POCT INR: INR: 1.7

## 2013-12-13 NOTE — Patient Instructions (Signed)
Anticoagulation Dose Instructions as of 12/13/2013     Susan Fitzgerald Tue Wed Thu Fri Sat   New Dose 5 mg 5 mg 5 mg 5 mg 5 mg 2.5 mg 5 mg    Description       Increase 1 tablet daily.       INR was 1.7 today

## 2013-12-14 ENCOUNTER — Encounter: Payer: Self-pay | Admitting: *Deleted

## 2013-12-14 NOTE — Telephone Encounter (Signed)
Letter completed and mailed to [employer]

## 2013-12-31 ENCOUNTER — Telehealth: Payer: Self-pay | Admitting: Pharmacist

## 2014-01-03 MED ORDER — LISINOPRIL 20 MG PO TABS: 40.0000 mg | ORAL_TABLET | Freq: Every day | ORAL | Status: DC

## 2014-01-03 NOTE — Telephone Encounter (Signed)
done

## 2014-01-06 ENCOUNTER — Telehealth: Payer: Self-pay | Admitting: *Deleted

## 2014-01-06 NOTE — Telephone Encounter (Signed)
Per [facility] rx PINDOLOL does not require a PA

## 2014-01-10 ENCOUNTER — Ambulatory Visit (INDEPENDENT_AMBULATORY_CARE_PROVIDER_SITE_OTHER): Payer: Medicare Other | Admitting: Pharmacist

## 2014-01-10 ENCOUNTER — Other Ambulatory Visit: Payer: Self-pay | Admitting: Pharmacist

## 2014-01-10 ENCOUNTER — Encounter: Payer: Self-pay | Admitting: Pharmacist

## 2014-01-10 VITALS — BP 140/62 | HR 68

## 2014-01-10 DIAGNOSIS — M810 Age-related osteoporosis without current pathological fracture: Secondary | ICD-10-CM

## 2014-01-10 DIAGNOSIS — I4891 Unspecified atrial fibrillation: Secondary | ICD-10-CM

## 2014-01-10 LAB — POCT INR: INR: 2.2

## 2014-01-10 NOTE — Patient Instructions (Signed)
Anticoagulation Dose Instructions as of 01/10/2014     Susan Fitzgerald Tue Wed Thu Fri Sat   New Dose 5 mg 5 mg 5 mg 5 mg 5 mg 5 mg 5 mg    Description       Continue 1 tablet daily.       INR was 2.2 today

## 2014-01-20 ENCOUNTER — Telehealth: Payer: Self-pay | Admitting: Pharmacist

## 2014-01-20 MED ORDER — AMLODIPINE BESYLATE 5 MG PO TABS: 2.5000 mg | ORAL_TABLET | Freq: Every day | ORAL | Status: DC

## 2014-01-20 NOTE — Telephone Encounter (Signed)
Patient calls in need of refills for amlodipine 5mg  - she take 1/2 to 1 tablet at bedtime (mostly 1/2 tablet). Rx sent and patient notified.

## 2014-01-31 ENCOUNTER — Telehealth: Payer: Self-pay | Admitting: Pharmacist

## 2014-01-31 ENCOUNTER — Other Ambulatory Visit: Payer: Self-pay | Admitting: Pharmacist

## 2014-01-31 NOTE — Telephone Encounter (Signed)
Opened in error

## 2014-01-31 NOTE — Telephone Encounter (Signed)
I would recommend that she continue the pindolol since there have been some problems in past when medications have been changed.

## 2014-01-31 NOTE — Telephone Encounter (Signed)
Her pindolol has jumped up to $45 and insurance recommended that she tried something else, but she feels like she is doing fine on this. Just would like to speak to you about it and see what you recommend.

## 2014-01-31 NOTE — Telephone Encounter (Signed)
What- does she need refill

## 2014-02-14 ENCOUNTER — Ambulatory Visit (INDEPENDENT_AMBULATORY_CARE_PROVIDER_SITE_OTHER): Payer: Medicare Other | Admitting: Pharmacist

## 2014-02-14 VITALS — BP 144/68 | HR 70

## 2014-02-14 DIAGNOSIS — I4891 Unspecified atrial fibrillation: Secondary | ICD-10-CM

## 2014-02-14 LAB — POCT INR: INR: 2

## 2014-02-14 NOTE — Patient Instructions (Signed)
Anticoagulation Dose Instructions as of 02/14/2014     Susan Fitzgerald Tue Wed Thu Fri Sat   New Dose 5 mg 5 mg 5 mg 5 mg 5 mg 5 mg 5 mg    Description       Continue 1 tablet daily.       INR was 2.0 today

## 2014-02-22 ENCOUNTER — Other Ambulatory Visit: Payer: Self-pay | Admitting: Family Medicine

## 2014-03-21 ENCOUNTER — Ambulatory Visit (INDEPENDENT_AMBULATORY_CARE_PROVIDER_SITE_OTHER): Payer: Medicare Other | Admitting: Pharmacist

## 2014-03-21 VITALS — BP 138/64 | HR 68

## 2014-03-21 DIAGNOSIS — I4891 Unspecified atrial fibrillation: Secondary | ICD-10-CM

## 2014-03-21 LAB — POCT INR: INR: 2.2

## 2014-03-21 NOTE — Patient Instructions (Signed)
Anticoagulation Dose Instructions as of 03/21/2014     Dorene Grebe Tue Wed Thu Fri Sat   New Dose 5 mg 5 mg 5 mg 5 mg 5 mg 5 mg 5 mg    Description       Continue 1 tablet daily.       INR was 2.2 today

## 2014-04-20 ENCOUNTER — Ambulatory Visit (INDEPENDENT_AMBULATORY_CARE_PROVIDER_SITE_OTHER): Payer: Medicare Other | Admitting: Pharmacist

## 2014-04-20 ENCOUNTER — Encounter: Payer: Self-pay | Admitting: Pharmacist

## 2014-04-20 ENCOUNTER — Ambulatory Visit (INDEPENDENT_AMBULATORY_CARE_PROVIDER_SITE_OTHER): Payer: Medicare Other

## 2014-04-20 VITALS — BP 155/70 | HR 68 | Ht 62.0 in | Wt 131.0 lb

## 2014-04-20 DIAGNOSIS — M810 Age-related osteoporosis without current pathological fracture: Secondary | ICD-10-CM

## 2014-04-20 DIAGNOSIS — I4891 Unspecified atrial fibrillation: Secondary | ICD-10-CM

## 2014-04-20 DIAGNOSIS — Z0000 Encounter for general adult medical examination without abnormal findings: Secondary | ICD-10-CM

## 2014-04-20 LAB — POCT INR: INR: 2.5

## 2014-04-20 LAB — HM DEXA SCAN

## 2014-04-20 NOTE — Patient Instructions (Addendum)
Health Maintenance Summary    INFLUENZA VACCINE Next Due 06/18/2014  last 08/23/2013    MAMMOGRAM Next Due 09/08/2014  last 09/08/2013    COLONOSCOPY Next Due 09/18/2020  Last 09/18/2010   Dexa  Next check 04/2015 Last 04/20/2014   Zostavax / Shingles vaccine Completed 04/18/2006    Pneumonia vaccine Completed      TETANUS/TDAP Next Due 08/18/2021          Anticoagulation Dose Instructions as of 04/20/2014     Susan Fitzgerald Tue Wed Thu Fri Sat   New Dose 5 mg 5 mg 5 mg 5 mg 5 mg 5 mg 5 mg    Description       Continue 1 tablet daily.         Exercise for Strong Bones  Exercise is important to build and maintain strong bones / bone density.  There are 2 types of exercises that are important to building and maintaining strong bones:  Weight- bearing and muscle-stregthening.  Weight-bearing Exercises  These exercises include activities that make you move against gravity while staying upright. Weight-bearing exercises can be high-impact or low-impact.  High-impact weight-bearing exercises help build bones and keep them strong. If you have broken a bone due to osteoporosis or are at risk of breaking a bone, you may need to avoid high-impact exercises. If you're not sure, you should check with your healthcare provider.  Examples of high-impact weight-bearing exercises are: Dancing  Doing high-impact aerobics  Hiking  Jogging/running  Jumping Rope  Stair climbing  Tennis  Low-impact weight-bearing exercises can also help keep bones strong and are a safe alternative if you cannot do high-impact exercises.   Examples of low-impact weight-bearing exercises are: Using elliptical training machines  Doing low-impact aerobics  Using stair-step machines  Fast walking on a treadmill or outside   Muscle-Strengthening Exercises These exercises include activities where you move your body, a weight or some other resistance against gravity. They are also known as resistance exercises and  include: Lifting weights  Using elastic exercise bands  Using weight machines  Lifting your own body weight  Functional movements, such as standing and rising up on your toes  Yoga and Pilates can also improve strength, balance and flexibility. However, certain positions may not be safe for people with osteoporosis or those at increased risk of broken bones. For example, exercises that have you bend forward may increase the chance of breaking a bone in the spine.   Non-Impact Exercises There are other types of exercises that can help prevent falls.  Non-impact exercises can help you to improve balance, posture and how well you move in everyday activities. Some of these exercises include: Balance exercises that strengthen your legs and test your balance, such as Tai Chi, can decrease your risk of falls.  Posture exercises that improve your posture and reduce rounded or "sloping" shoulders can help you decrease the chance of breaking a bone, especially in the spine.  Functional exercises that improve how well you move can help you with everyday activities and decrease your chance of falling and breaking a bone. For example, if you have trouble getting up from a chair or climbing stairs, you should do these activities as exercises.   **A physical therapist can teach you balance, posture and functional exercises. He/she can also help you learn which exercises are safe and appropriate for you.  [facility] has a physical therapy office in [place] in front of our office and referrals can be  made for assessments and treatment as needed and strength and balance training.  If you would like to have an assessment with Susan Fitzgerald and our physical therapy team please let a nurse or provider know.     Preventive Care for Adults, Female A healthy lifestyle and preventive care can promote health and wellness. Preventive health guidelines for women include the following key practices.  A routine yearly physical  is a good way to check with your health care provider about your health and preventive screening. It is a chance to share any concerns and updates on your health and to receive a thorough exam.  Visit your dentist for a routine exam and preventive care every 6 months. Brush your teeth twice a day and floss once a day. Good oral hygiene prevents tooth decay and gum disease.  The frequency of eye exams is based on your age, health, family medical history, use of contact lenses, and other factors. Follow your health care provider's recommendations for frequency of eye exams.  Eat a healthy diet. Foods like vegetables, fruits, whole grains, low-fat dairy products, and lean protein foods contain the nutrients you need without too many calories. Decrease your intake of foods high in solid fats, added sugars, and salt. Eat the right amount of calories for you.Get information about a proper diet from your health care provider, if necessary.  Regular physical exercise is one of the most important things you can do for your health. Most adults should get at least 150 minutes of moderate-intensity exercise (any activity that increases your heart rate and causes you to sweat) each week. In addition, most adults need muscle-strengthening exercises on 2 or more days a week.  Maintain a healthy weight. The body mass index (BMI) is a screening tool to identify possible weight problems. It provides an estimate of body fat based on height and weight. Your health care provider can find your BMI, and can help you achieve or maintain a healthy weight.For adults 20 years and older:  A BMI below 18.5 is considered underweight.  A BMI of 18.5 to 24.9 is normal.  A BMI of 25 to 29.9 is considered overweight.  A BMI of 30 and above is considered obese.  Maintain normal blood lipids and cholesterol levels by exercising and minimizing your intake of saturated fat. Eat a balanced diet with plenty of fruit and vegetables.  Blood tests for lipids and cholesterol should begin at age 69 and be repeated every 5 years. If your lipid or cholesterol levels are high, you are over 50, or you are at high risk for heart disease, you may need your cholesterol levels checked more frequently.Ongoing high lipid and cholesterol levels should be treated with medicines if diet and exercise are not working.  If you smoke, find out from your health care provider how to quit. If you do not use tobacco, do not start.  Lung cancer screening is recommended for adults aged 31 80 years who are at high risk for developing lung cancer because of a history of smoking. A yearly low-dose CT scan of the lungs is recommended for people who have at least a 30-pack-year history of smoking and are a current smoker or have quit within the past 15 years. A pack year of smoking is smoking an average of 1 pack of cigarettes a day for 1 year (for example: 1 pack a day for 30 years or 2 packs a day for 15 years). Yearly screening should continue until the smoker  has stopped smoking for at least 15 years. Yearly screening should be stopped for people who develop a health problem that would prevent them from having lung cancer treatment.  If you are pregnant, do not drink alcohol. If you are breastfeeding, be very cautious about drinking alcohol. If you are not pregnant and choose to drink alcohol, do not have more than 1 drink per day. One drink is considered to be 12 ounces (355 mL) of beer, 5 ounces (148 mL) of wine, or 1.5 ounces (44 mL) of liquor.  Avoid use of street drugs. Do not share needles with anyone. Ask for help if you need support or instructions about stopping the use of drugs.  High blood pressure causes heart disease and increases the risk of stroke. Your blood pressure should be checked at least every 1 to 2 years. Ongoing high blood pressure should be treated with medicines if weight loss and exercise do not work.  If you are 17 78 years old,  ask your health care provider if you should take aspirin to prevent strokes.  Diabetes screening involves taking a blood sample to check your fasting blood sugar level. This should be done once every 3 years, after age 21, if you are within normal weight and without risk factors for diabetes. Testing should be considered at a younger age or be carried out more frequently if you are overweight and have at least 1 risk factor for diabetes.  Breast cancer screening is essential preventive care for women. You should practice "breast self-awareness." This means understanding the normal appearance and feel of your breasts and may include breast self-examination. Any changes detected, no matter how small, should be reported to a health care provider. Women in their 8s and 30s should have a clinical breast exam (CBE) by a health care provider as part of a regular health exam every 1 to 3 years. After age 49, women should have a CBE every year. Starting at age 48, women should consider having a mammogram (breast X-ray test) every year. Women who have a family history of breast cancer should talk to their health care provider about genetic screening. Women at a high risk of breast cancer should talk to their health care providers about having an MRI and a mammogram every year.  Breast cancer gene (BRCA)-related cancer risk assessment is recommended for women who have family members with BRCA-related cancers. BRCA-related cancers include breast, ovarian, tubal, and peritoneal cancers. Having family members with these cancers may be associated with an increased risk for harmful changes (mutations) in the breast cancer genes BRCA1 and BRCA2. Results of the assessment will determine the need for genetic counseling and BRCA1 and BRCA2 testing.  The Pap test is a screening test for cervical cancer. A Pap test can show cell changes on the cervix that might become cervical cancer if left untreated. A Pap test is a procedure  in which cells are obtained and examined from the lower end of the uterus (cervix).  Women should have a Pap test starting at age 60.  Between ages 73 and 13, Pap tests should be repeated every 2 years.  Beginning at age 37, you should have a Pap test every 3 years as long as the past 3 Pap tests have been normal.  Some women have medical problems that increase the chance of getting cervical cancer. Talk to your health care provider about these problems. It is especially important to talk to your health care provider if a new  problem develops soon after your last Pap test. In these cases, your health care provider may recommend more frequent screening and Pap tests.  The above recommendations are the same for women who have or have not gotten the vaccine for human papillomavirus (HPV).  If you had a hysterectomy for a problem that was not cancer or a condition that could lead to cancer, then you no longer need Pap tests. Even if you no longer need a Pap test, a regular exam is a good idea to make sure no other problems are starting.  If you are between ages 25 and 64 years, and you have had normal Pap tests going back 10 years, you no longer need Pap tests. Even if you no longer need a Pap test, a regular exam is a good idea to make sure no other problems are starting.  If you have had past treatment for cervical cancer or a condition that could lead to cancer, you need Pap tests and screening for cancer for at least 20 years after your treatment.  If Pap tests have been discontinued, risk factors (such as a new sexual partner) need to be reassessed to determine if screening should be resumed.  The HPV test is an additional test that may be used for cervical cancer screening. The HPV test looks for the virus that can cause the cell changes on the cervix. The cells collected during the Pap test can be tested for HPV. The HPV test could be used to screen women aged 72 years and older, and should be  used in women of any age who have unclear Pap test results. After the age of 29, women should have HPV testing at the same frequency as a Pap test.  Colorectal cancer can be detected and often prevented. Most routine colorectal cancer screening begins at the age of 52 years and continues through age 23 years. However, your health care provider may recommend screening at an earlier age if you have risk factors for colon cancer. On a yearly basis, your health care provider may provide home test kits to check for hidden blood in the stool. Use of a small camera at the end of a tube, to directly examine the colon (sigmoidoscopy or colonoscopy), can detect the earliest forms of colorectal cancer. Talk to your health care provider about this at age 49, when routine screening begins. Direct exam of the colon should be repeated every 5 10 years through age 73 years, unless early forms of pre-cancerous polyps or small growths are found.  People who are at an increased risk for hepatitis B should be screened for this virus. You are considered at high risk for hepatitis B if:  You were born in a country where hepatitis B occurs often. Talk with your health care provider about which countries are considered high risk.  Your parents were born in a high-risk country and you have not received a shot to protect against hepatitis B (hepatitis B vaccine).  You have HIV or AIDS.  You use needles to inject street drugs.  You live with, or have sex with, someone who has Hepatitis B.  You get hemodialysis treatment.  You take certain medicines for conditions like cancer, organ transplantation, and autoimmune conditions.  Hepatitis C blood testing is recommended for all people born from [birth year] through 1963 and any individual with known risks for hepatitis C.  Practice safe sex. Use condoms and avoid high-risk sexual practices to reduce the spread of sexually transmitted  infections (STIs). STIs include gonorrhea,  chlamydia, syphilis, trichomonas, herpes, HPV, and human immunodeficiency virus (HIV). Herpes, HIV, and HPV are viral illnesses that have no cure. They can result in disability, cancer, and death. Sexually active women aged 21 years and younger should be checked for chlamydia. Older women with new or multiple partners should also be tested for chlamydia. Testing for other STIs is recommended if you are sexually active and at increased risk.  Osteoporosis is a disease in which the bones lose minerals and strength with aging. This can result in serious bone fractures or breaks. The risk of osteoporosis can be identified using a bone density scan. Women ages 73 years and over and women at risk for fractures or osteoporosis should discuss screening with their health care providers. Ask your health care provider whether you should take a calcium supplement or vitamin D to reduce the rate of osteoporosis.  Menopause can be associated with physical symptoms and risks. Hormone replacement therapy is available to decrease symptoms and risks. You should talk to your health care provider about whether hormone replacement therapy is right for you.  Use sunscreen. Apply sunscreen liberally and repeatedly throughout the day. You should seek shade when your shadow is shorter than you. Protect yourself by wearing long sleeves, pants, a wide-brimmed hat, and sunglasses year round, whenever you are outdoors.  Once a month, do a whole body skin exam, using a mirror to look at the skin on your back. Tell your health care provider of new moles, moles that have irregular borders, moles that are larger than a pencil eraser, or moles that have changed in shape or color.  Stay current with required vaccines (immunizations).  Influenza vaccine. All adults should be immunized every year.  Tetanus, diphtheria, and acellular pertussis (Td, Tdap) vaccine. Pregnant women should receive 1 dose of Tdap vaccine during each pregnancy.  The dose should be obtained regardless of the length of time since the last dose. Immunization is preferred during the 27th 36th week of gestation. An adult who has not previously received Tdap or who does not know her vaccine status should receive 1 dose of Tdap. This initial dose should be followed by tetanus and diphtheria toxoids (Td) booster doses every 10 years. Adults with an unknown or incomplete history of completing a 3-dose immunization series with Td-containing vaccines should begin or complete a primary immunization series including a Tdap dose. Adults should receive a Td booster every 10 years.  Varicella vaccine. An adult without evidence of immunity to varicella should receive 2 doses or a second dose if she has previously received 1 dose. Pregnant females who do not have evidence of immunity should receive the first dose after pregnancy. This first dose should be obtained before leaving the health care facility. The second dose should be obtained 4 8 weeks after the first dose.  Pneumococcal 13-valent conjugate (PCV13) vaccine. When indicated, a person who is uncertain of her immunization history and has no record of immunization should receive the PCV13 vaccine. An adult aged 66 years or older who has certain medical conditions and has not been previously immunized should receive 1 dose of PCV13 vaccine. This PCV13 should be followed with a dose of pneumococcal polysaccharide (PPSV23) vaccine. The PPSV23 vaccine dose should be obtained at least 8 weeks after the dose of PCV13 vaccine. An adult aged 85 years or older who has certain medical conditions and previously received 1 or more doses of PPSV23 vaccine should receive 1 dose of  PCV13. The PCV13 vaccine dose should be obtained 1 or more years after the last PPSV23 vaccine dose.  Pneumococcal polysaccharide (PPSV23) vaccine. When PCV13 is also indicated, PCV13 should be obtained first. All adults aged 17 years and older should be immunized.  An adult younger than age 17 years who has certain medical conditions should be immunized. Any person who resides in a nursing home or long-term care facility should be immunized. An adult smoker should be immunized. People with an immunocompromised condition and certain other conditions should receive both PCV13 and PPSV23 vaccines. People with human immunodeficiency virus (HIV) infection should be immunized as soon as possible after diagnosis. Immunization during chemotherapy or radiation therapy should be avoided. Routine use of PPSV23 vaccine is not recommended for American Indians, [place] Natives, or people younger than 65 years unless there are medical conditions that require PPSV23 vaccine. When indicated, people who have unknown immunization and have no record of immunization should receive PPSV23 vaccine. One-time revaccination 5 years after the first dose of PPSV23 is recommended for people aged 47 64 years who have chronic kidney failure, nephrotic syndrome, asplenia, or immunocompromised conditions. People who received 1 2 doses of PPSV23 before age 51 years should receive another dose of PPSV23 vaccine at age 48 years or later if at least 5 years have passed since the previous dose. Doses of PPSV23 are not needed for people immunized with PPSV23 at or after age 73 years.  Meningococcal vaccine. Adults with asplenia or persistent complement component deficiencies should receive 2 doses of quadrivalent meningococcal conjugate (MenACWY-D) vaccine. The doses should be obtained at least 2 months apart. Microbiologists working with certain meningococcal bacteria, [place] recruits, people at risk during an outbreak, and people who travel to or live in countries with a high rate of meningitis should be immunized. A first-year college student up through age 23 years who is living in a residence hall should receive a dose if she did not receive a dose on or after her 16th birthday. Adults who have certain  high-risk conditions should receive one or more doses of vaccine.  Hepatitis A vaccine. Adults who wish to be protected from this disease, have certain high-risk conditions, work with hepatitis A-infected animals, work in hepatitis A research labs, or travel to or work in countries with a high rate of hepatitis A should be immunized. Adults who were previously unvaccinated and who anticipate close contact with an international adoptee during the first 60 days after arrival in the [place] from a country with a high rate of hepatitis A should be immunized.  Hepatitis B vaccine. Adults who wish to be protected from this disease, have certain high-risk conditions, may be exposed to blood or other infectious body fluids, are household contacts or sex partners of hepatitis B positive people, are clients or workers in certain care facilities, or travel to or work in countries with a high rate of hepatitis B should be immunized.

## 2014-04-20 NOTE — Progress Notes (Signed)
Patient ID: Susan Fitzgerald, female   DOB: January 09, 1932, 80 y.o.   MRN: [id number]  Subjective:    Susan Fitzgerald is a 80 y.o. female who presents for Medicare Initial Wellness examination and for DEXA review / osteoporosis  Patient with history of osteoporosis.  She took alendronate for about 10 years - has been off for last 2 years.  We looked into Prolia coverage but patient declined due to cost.   History of fractured pelvic belt.  Preventive Screening-Counseling & Management  Tobacco History  Smoking status  . Never Smoker   Smokeless tobacco  . Never Used    Current Problems (verified) Patient Active Problem List   Diagnosis Date Noted  . Symptomatic bradycardia 06/02/2013  . Hypertension 04/21/2013  . Osteoporosis 04/21/2013  . Atrial fibrillation 02/09/2013    Medications Prior to Visit Current Outpatient Prescriptions on File Prior to Visit  Medication Sig Dispense Refill  . amLODipine (NORVASC) 5 MG tablet Take 0.5 tablets (2.5 mg total) by mouth at bedtime. Take 1/2 to 1 tablet by mouth at bedtime  90 tablet  0  . calcium-vitamin D (OSCAL WITH D) 250-125 MG-UNIT per tablet Take 1 tablet by mouth 2 (two) times daily.      . flecainide (TAMBOCOR) 150 MG tablet Take 0.5 tablets (75 mg total) by mouth every 12 (twelve) hours.  90 tablet  1  . furosemide (LASIX) 20 MG tablet Take 20 mg by mouth daily.      Marland Kitchen lisinopril (PRINIVIL,ZESTRIL) 20 MG tablet Take 2 tablets (40 mg total) by mouth daily.  180 tablet  1  . Multiple Vitamin (MULTIVITAMIN) capsule Take 1 capsule by mouth daily.      . pindolol (VISKEN) 5 MG tablet Take 1 tablet (5 mg total) by mouth 2 (two) times daily.  180 tablet  1  . potassium chloride SA (K-DUR,KLOR-CON) 20 MEQ tablet Take 20 mEq by mouth daily.      Marland Kitchen warfarin (COUMADIN) 5 MG tablet TAKE ONE & ONE-HALF TABLETS BY MOUTH EVERY DAY OR AS DIRECTED  90 tablet  0   No current facility-administered medications on file prior to visit.    Current  Medications (verified) Current Outpatient Prescriptions  Medication Sig Dispense Refill  . amLODipine (NORVASC) 5 MG tablet Take 0.5 tablets (2.5 mg total) by mouth at bedtime. Take 1/2 to 1 tablet by mouth at bedtime  90 tablet  0  . calcium-vitamin D (OSCAL WITH D) 250-125 MG-UNIT per tablet Take 1 tablet by mouth 2 (two) times daily.      . flecainide (TAMBOCOR) 150 MG tablet Take 0.5 tablets (75 mg total) by mouth every 12 (twelve) hours.  90 tablet  1  . furosemide (LASIX) 20 MG tablet Take 20 mg by mouth daily.      Marland Kitchen lisinopril (PRINIVIL,ZESTRIL) 20 MG tablet Take 2 tablets (40 mg total) by mouth daily.  180 tablet  1  . Multiple Vitamin (MULTIVITAMIN) capsule Take 1 capsule by mouth daily.      . pindolol (VISKEN) 5 MG tablet Take 1 tablet (5 mg total) by mouth 2 (two) times daily.  180 tablet  1  . potassium chloride SA (K-DUR,KLOR-CON) 20 MEQ tablet Take 20 mEq by mouth daily.      Marland Kitchen warfarin (COUMADIN) 5 MG tablet TAKE ONE & ONE-HALF TABLETS BY MOUTH EVERY DAY OR AS DIRECTED  90 tablet  0   No current facility-administered medications for this visit.     Allergies (verified)  Benazepril hcl   PAST HISTORY  Family History Family History  Problem Relation Age of Onset  . Stroke Mother     cerebral hemorrhage  . Heart disease Father     MI  . Heart attack Father   . Vision loss Father   . Heart disease Brother   . Heart attack Brother   . Cancer Maternal Aunt     breast  . Cancer Brother   . Heart disease Brother   . Cancer Daughter     breast    Social History History  Substance Use Topics  . Smoking status: Never Smoker   . Smokeless tobacco: Never Used  . Alcohol Use: No     Are there smokers in your home (other than you)? No  Risk Factors Current exercise habits: patient golfs 2-3 times per week  Dietary issues discussed: none   Cardiac risk factors: advanced age (older than 22 for men, 58 for women), family history of premature cardiovascular disease  and hypertension.  Depression Screen (Note: if answer to either of the following is "Yes", a more complete depression screening is indicated)   Over the past 2 weeks, have you felt down, depressed or hopeless? No  Over the past 2 weeks, have you felt little interest or pleasure in doing things? No  Have you lost interest or pleasure in daily life? No  Do you often feel hopeless? No  Do you cry easily over simple problems? No  Activities of Daily Living In your present state of health, do you have any difficulty performing the following activities?:  Driving? No Managing money?  No Feeding yourself? No Getting from bed to chair? No  Climbing a flight of stairs? No Preparing food and eating?: No Bathing or showering? No Getting dressed: No Getting to the toilet? No Using the toilet:No Moving around from place to place: No In the past year have you fallen or had a near fall?:No   Are you sexually active?  Yes  Do you have more than one partner?  No  Hearing Difficulties: No Do you often ask people to speak up or repeat themselves? No Do you experience ringing or noises in your ears? No Do you have difficulty understanding soft or whispered voices? No   Do you feel that you have a problem with memory? No  Do you often misplace items? No  Do you feel safe at home?  Yes  Cognitive Testing  Alert? Yes  Normal Appearance?Yes  Oriented to person? Yes  Place? Yes   Time? Yes  Recall of three objects?  Yes  Can perform simple calculations? Yes  Displays appropriate judgment?Yes  Can read the correct time from a watch face?Yes   Advanced Directives have been discussed with the patient? Yes  List the Names of Other Physician/Practitioners you currently use: 1.  Dr Jenkins [place] - Cardiologist 2.  Dr Arnoldo Morale at [facility] - [place] any recent [place] you may have received from other than [facility] providers in the past year  (date may be approximate).  Immunization History  Administered Date(s) Administered  . Influenza,inj,Quad PF,36+ Mos 08/23/2013    Screening Tests Health Maintenance  Topic Date Due  . Influenza Vaccine  06/18/2014  . Mammogram  09/08/2014  . Colonoscopy  09/18/2020  . Tetanus/tdap  08/18/2021  . Pneumococcal Polysaccharide Vaccine Age 32 And Over  Completed  . Zostavax  Completed  DEXA performed today Glaucoma screening and dilated eye  exam done at [facility] per patient within the last year.  All answers were reviewed with the patient and necessary referrals were made:  Cherre Robins, [facility]   04/20/2014   History reviewed: allergies, current medications, past family history, past medical history, past social history, past surgical history and problem list    Objective:     Body mass index is 23.95 kg/(m^2). BP 155/70  Pulse 68  Ht '5\' 2"'  (1.575 m)  Wt 131 lb (59.421 kg)  BMI 23.95 kg/m2  INR = 2.5 today  GFR = 72 (08/24/2013) Vitamin D = 49 (10/2012) Dexa results: Tscore for spine = -1.3 today T-Score of total left hip = -1.5 today T-Score of total right hip = -1.9 today Lowest T-Score  = -2.9 at neck of right hip     Assessment:     Medicare Annual Wellness Visit Osteoporosis Hypertension - patient did not sleep well last night.  Last visits BP was at goal.   Therapeutic anticoagulation     Plan:     During the course of the visit the patient was educated and counseled about appropriate screening and preventive services including:    Pneumococcal vaccine   Influenza vaccine  Hepatitis B vaccine  Td vaccine  Screening mammography  Bone densitometry screening  Colorectal cancer screening  Diabetes screening  Glaucoma screening  Advanced directives: patient given copy of [facility] [employer] information regarding advanced directives  DEXA done today - will recheck in 1 year since small changes seen.  Check into Prolia coverage or may  consider restart alendronate in future  Calcium 1266m daily  Weight bearing exercise discussed  Fall prevention discussed.   Patient to monitor BP at home - if continues to get BP readings 150/90 or greater will call office for medication adjustment. Orders Placed This Encounter  Procedures  . HM DEXA SCAN    This external order was created through the Results Console.  . CMP14+EGFR  . Vit D  25 hydroxy (rtn osteoporosis monitoring)  . Lipid panel  . LDL Cholesterol, Direct   Anticoagulation Dose Instructions as of 04/20/2014     SDorene GrebeTue Wed Thu Fri Sat   New Dose 5 mg 5 mg 5 mg 5 mg 5 mg 5 mg 5 mg    Description       Continue 1 tablet daily.       Patient Instructions (the written plan) was given to the patient.  Medicare Attestation I have personally reviewed: The patient's medical and social history Their use of alcohol, tobacco or illicit drugs Their current medications and supplements The patient's functional ability including ADLs,fall risks, home safety risks, cognitive, and hearing and visual impairment Diet and physical activities Evidence for depression or mood disorders  The patient's weight, height, BMI, and visual acuity have been recorded in the chart.  I have made referrals, counseling, and provided education to the patient based on review of the above and I have provided the patient with a written personalized care plan for preventive services.     [facility]herre Robins [facility]  04/20/2014

## 2014-04-21 ENCOUNTER — Telehealth: Payer: Self-pay | Admitting: Pharmacist

## 2014-04-21 LAB — CMP14+EGFR
ALT: 16 IU/L (ref 0–32)
AST: 29 IU/L (ref 0–40)
Albumin/Globulin Ratio: 1.6 (ref 1.1–2.5)
Albumin: 4.1 g/dL (ref 3.5–4.7)
Alkaline Phosphatase: 75 IU/L (ref 39–117)
BUN/Creatinine Ratio: 21 (ref 11–26)
BUN: 19 mg/dL (ref 8–27)
CALCIUM: 9.4 mg/dL (ref 8.7–10.3)
CHLORIDE: 102 mmol/L (ref 97–108)
CO2: 27 mmol/L (ref 18–29)
Creatinine, Ser: 0.9 mg/dL (ref 0.57–1.00)
GFR calc Af Amer: 70 mL/min/{1.73_m2} (ref >59)
GFR calc non Af Amer: 61 mL/min/{1.73_m2} (ref >59)
GLUCOSE: 84 mg/dL (ref 65–99)
Globulin, Total: 2.5 g/dL (ref 1.5–4.5)
POTASSIUM: 4.1 mmol/L (ref 3.5–5.2)
Sodium: 144 mmol/L (ref 134–144)
TOTAL PROTEIN: 6.6 g/dL (ref 6.0–8.5)
Total Bilirubin: 0.4 mg/dL (ref 0.0–1.2)

## 2014-04-21 LAB — LIPID PANEL
Chol/HDL Ratio: 2.4 ratio units (ref 0.0–4.4)
Cholesterol, Total: 176 mg/dL (ref 100–199)
HDL: 72 mg/dL (ref >39)
LDL Calculated: 90 mg/dL (ref 0–99)
TRIGLYCERIDES: 68 mg/dL (ref 0–149)
VLDL Cholesterol Cal: 14 mg/dL (ref 5–40)

## 2014-04-21 LAB — VITAMIN D 25 HYDROXY (VIT D DEFICIENCY, FRACTURES): Vit D, 25-Hydroxy: 46.7 ng/mL (ref 30.0–100.0)

## 2014-04-21 LAB — LDL CHOLESTEROL, DIRECT: LDL Direct: 98 mg/dL (ref 0–99)

## 2014-04-21 NOTE — Telephone Encounter (Signed)
All labs WNL - patient notified

## 2014-04-25 ENCOUNTER — Telehealth: Payer: Self-pay | Admitting: Family Medicine

## 2014-04-25 MED ORDER — PINDOLOL 5 MG PO TABS: 5.0000 mg | ORAL_TABLET | Freq: Two times a day (BID) | ORAL | Status: DC

## 2014-04-25 NOTE — Telephone Encounter (Signed)
rx sent to [facility] - patient aware

## 2014-05-03 ENCOUNTER — Other Ambulatory Visit: Payer: Self-pay | Admitting: *Deleted

## 2014-05-03 ENCOUNTER — Telehealth: Payer: Self-pay | Admitting: *Deleted

## 2014-05-03 MED ORDER — POTASSIUM CHLORIDE CRYS ER 20 MEQ PO TBCR: 20.0000 meq | EXTENDED_RELEASE_TABLET | Freq: Every day | ORAL | Status: DC

## 2014-05-03 NOTE — Telephone Encounter (Signed)
Yes, ok to refill Klor-Con 20 MEQ. Pt needs appt in the near future. Ok for 30 day supply with 0 refills.

## 2014-05-03 NOTE — Telephone Encounter (Signed)
[facility] is requesting a refill of klor con for the patient, but it has Dr Hassell Done name on it. Does he see this patient? If so ok to refill klor-con m20, take one qd? Please advise. Thanks, MI

## 2014-05-20 ENCOUNTER — Other Ambulatory Visit: Payer: Self-pay | Admitting: Family Medicine

## 2014-05-23 ENCOUNTER — Ambulatory Visit (INDEPENDENT_AMBULATORY_CARE_PROVIDER_SITE_OTHER): Payer: Medicare Other | Admitting: Pharmacist

## 2014-05-23 ENCOUNTER — Encounter: Payer: Self-pay | Admitting: Pharmacist

## 2014-05-23 VITALS — BP 140/72 | HR 65

## 2014-05-23 DIAGNOSIS — I10 Essential (primary) hypertension: Secondary | ICD-10-CM

## 2014-05-23 DIAGNOSIS — I4891 Unspecified atrial fibrillation: Secondary | ICD-10-CM

## 2014-05-23 DIAGNOSIS — I482 Chronic atrial fibrillation, unspecified: Secondary | ICD-10-CM

## 2014-05-23 LAB — POCT INR: INR: 2.5

## 2014-05-23 MED ORDER — POTASSIUM CHLORIDE CRYS ER 20 MEQ PO TBCR: 20.0000 meq | EXTENDED_RELEASE_TABLET | Freq: Every day | ORAL | Status: DC

## 2014-05-23 NOTE — Progress Notes (Signed)
Subjective:    Patient here for follow-up of elevated blood pressure and have protime / INR rechecked due to chronic anticoagulation therapy.  She is exercising and is adherent to a low-salt diet.  Blood pressure is variable but mostly well controlled at home. Cardiac symptoms: irregular heart beat and lower extremity edema. Patient denies: chest pain, claudication, dyspnea, fatigue, orthopnea, palpitations, paroxysmal nocturnal dyspnea, syncope and tachypnea. Cardiovascular risk factors: advanced age (older than 56 for men, 55 for women), family history of premature cardiovascular disease and hypertension. Use of agents associated with hypertension: none. History of target organ damage: none.  The following portions of the patient's history were reviewed and updated as appropriate: allergies, current medications, past family history, past medical history, past social history, past surgical history and problem list.   Objective:   Filed Vitals:   05/23/14 1021  BP: 140/72  Pulse: 65    INR was 2.5 today   Assessment:    Hypertension, stage 2 controlled. Evidence of target organ damage: none.  Therapeutic anticoagulation   Plan:    Medication: no change.  Anticoagulation Dose Instructions as of 05/23/2014     Dorene Grebe Tue Wed Thu Fri Sat   New Dose 5 mg 5 mg 5 mg 5 mg 5 mg 5 mg 5 mg    Description       Continue warfarin 5mg  1 tablet daily.       RTC in 1 month to see clinical pharmacist and 2 months for follow up with Dr Laurance Flatten  Cherre Robins, PharmD, CPP

## 2014-05-23 NOTE — Patient Instructions (Signed)
Anticoagulation Dose Instructions as of 05/23/2014     Susan Fitzgerald Tue Wed Thu Fri Sat   New Dose 5 mg 5 mg 5 mg 5 mg 5 mg 5 mg 5 mg    Description       Continue warfarin 5mg  1 tablet daily.

## 2014-06-04 ENCOUNTER — Other Ambulatory Visit: Payer: Self-pay | Admitting: Pharmacist

## 2014-06-04 ENCOUNTER — Other Ambulatory Visit: Payer: Self-pay | Admitting: Family Medicine

## 2014-06-27 ENCOUNTER — Other Ambulatory Visit: Payer: Self-pay

## 2014-06-27 MED ORDER — LISINOPRIL 20 MG PO TABS: 40.0000 mg | ORAL_TABLET | Freq: Every day | ORAL | Status: DC

## 2014-06-27 NOTE — Telephone Encounter (Signed)
Patient seen by Tammy 05/23/14 but not seen by provider since 06/21/13  MMM

## 2014-06-27 NOTE — Telephone Encounter (Signed)
Needs to be seen by provider

## 2014-06-28 ENCOUNTER — Telehealth: Payer: Self-pay | Admitting: Pharmacist

## 2014-06-29 MED ORDER — LISINOPRIL 40 MG PO TABS: 40.0000 mg | ORAL_TABLET | Freq: Every day | ORAL | Status: DC

## 2014-06-29 NOTE — Telephone Encounter (Signed)
Discussed with patient change to 40mg  1 tablet daily.  Rx sent to [facility]

## 2014-07-04 ENCOUNTER — Telehealth: Payer: Self-pay | Admitting: Family Medicine

## 2014-07-04 ENCOUNTER — Ambulatory Visit (INDEPENDENT_AMBULATORY_CARE_PROVIDER_SITE_OTHER): Payer: Medicare Other | Admitting: Pharmacist

## 2014-07-04 VITALS — BP 130/77 | HR 72

## 2014-07-04 DIAGNOSIS — I482 Chronic atrial fibrillation, unspecified: Secondary | ICD-10-CM

## 2014-07-04 DIAGNOSIS — I4891 Unspecified atrial fibrillation: Secondary | ICD-10-CM

## 2014-07-04 LAB — POCT INR: INR: 2.4

## 2014-07-04 NOTE — Progress Notes (Signed)
Patient c/o feeling that HR has been going in and out of rhythm since 07/01/14.  She has not contact cardiologist.  Today HR is RRR however appt made for patient to have evaluation be PCP Dr Laurance Flatten ASAP.

## 2014-07-04 NOTE — Patient Instructions (Signed)
Anticoagulation Dose Instructions as of 07/04/2014     Susan Fitzgerald Tue Wed Thu Fri Sat   New Dose 5 mg 5 mg 5 mg 5 mg 5 mg 5 mg 5 mg    Description       Continue warfarin 5mg  1 tablet daily.       INR was 2.4 today

## 2014-07-04 NOTE — Telephone Encounter (Signed)
Patient states that HR has been regular this afternoon and wants to know if she really needs to see Dr Laurance Flatten tomorrow.  I do believe she needs to be evaluated and she reluctantly agrees to keep appt tomorrow.

## 2014-07-05 ENCOUNTER — Ambulatory Visit (INDEPENDENT_AMBULATORY_CARE_PROVIDER_SITE_OTHER): Payer: Medicare Other | Admitting: Family Medicine

## 2014-07-05 ENCOUNTER — Encounter: Payer: Self-pay | Admitting: Family Medicine

## 2014-07-05 VITALS — BP 133/75 | HR 70 | Temp 97.8°F | Ht 62.0 in | Wt 131.0 lb

## 2014-07-05 DIAGNOSIS — I10 Essential (primary) hypertension: Secondary | ICD-10-CM

## 2014-07-05 DIAGNOSIS — I4891 Unspecified atrial fibrillation: Secondary | ICD-10-CM

## 2014-07-05 DIAGNOSIS — I499 Cardiac arrhythmia, unspecified: Secondary | ICD-10-CM

## 2014-07-05 DIAGNOSIS — I482 Chronic atrial fibrillation, unspecified: Secondary | ICD-10-CM

## 2014-07-05 NOTE — Patient Instructions (Signed)
We will call you with the thyroid and BMP results as sedated as they are available We will schedule you for a visit with Dr. Jodi Mourning in for followup Continue regular activity Continue to watch her caffeine intake

## 2014-07-05 NOTE — Progress Notes (Signed)
Subjective:    Patient ID: LATIFAH PADIN, female    DOB: [birth year], 80 y.o.   MRN: [id number]  HPI Patient here today for irregular heart beat that has been going on for 3-4 days. Patient is not complaining of shortness of breath or chest pain. The patient sees the cardiologist regularly. She is on Coumadin for the atrial fibrillation.         Patient Active Problem List   Diagnosis Date Noted  . Symptomatic bradycardia 06/02/2013  . Hypertension 04/21/2013  . Osteoporosis 04/21/2013  . Atrial fibrillation, chronic 02/09/2013   Outpatient Encounter Prescriptions as of 07/05/2014  Medication Sig  . amLODipine (NORVASC) 5 MG tablet TAKE ONE-HALF TO ONE TABLET BY MOUTH AT BEDTIME  . calcium-vitamin D (OSCAL WITH D) 250-125 MG-UNIT per tablet Take 1 tablet by mouth 2 (two) times daily.  . flecainide (TAMBOCOR) 150 MG tablet TAKE ONE-HALF TABLET BY MOUTH EVERY 12 HOURS  . furosemide (LASIX) 20 MG tablet Take 20 mg by mouth daily.  Marland Kitchen lisinopril (PRINIVIL,ZESTRIL) 40 MG tablet Take 1 tablet (40 mg total) by mouth daily.  . Multiple Vitamin (MULTIVITAMIN) capsule Take 1 capsule by mouth daily.  . pindolol (VISKEN) 5 MG tablet Take 1 tablet (5 mg total) by mouth 2 (two) times daily.  . potassium chloride SA (K-DUR,KLOR-CON) 20 MEQ tablet Take 1 tablet (20 mEq total) by mouth daily.  Marland Kitchen warfarin (COUMADIN) 5 MG tablet TAKE ONE & ONE-HALF TABLETS BY MOUTH ONCE DAILY OR  AS  DIRECTED    Review of Systems  Constitutional: Negative.   HENT: Negative.   Eyes: Negative.   Respiratory: Negative.   Cardiovascular: Positive for palpitations (irregular x 3-4 days).  Gastrointestinal: Negative.   Endocrine: Negative.   Genitourinary: Negative.   Musculoskeletal: Negative.   Skin: Negative.   Allergic/Immunologic: Negative.   Neurological: Negative.   Hematological: Negative.   Psychiatric/Behavioral: Negative.        Objective:   Physical Exam  Nursing note and vitals  reviewed. Constitutional: She is oriented to person, place, and time. She appears well-developed and well-nourished. No distress.  HENT:  Head: Normocephalic.  Eyes: Conjunctivae and EOM are normal. Pupils are equal, round, and reactive to light. Right eye exhibits no discharge. Left eye exhibits no discharge. No scleral icterus.  Neck: Normal range of motion. Neck supple. No thyromegaly present.  Cardiovascular: Normal rate, normal heart sounds and intact distal pulses.   No murmur heard. Pulmonary/Chest: Effort normal and breath sounds normal. No respiratory distress. She has no wheezes. She has no rales.  Abdominal: Soft. Bowel sounds are normal. She exhibits no mass. There is no tenderness. There is no rebound and no guarding.  Musculoskeletal: Normal range of motion. She exhibits no edema.  Lymphadenopathy:    She has no cervical adenopathy.  Neurological: She is alert and oriented to person, place, and time.  Skin: Skin is warm and dry. No rash noted. No erythema. No pallor.  Psychiatric: She has a normal mood and affect. Her behavior is normal. Judgment and thought content normal.   BP 133/75  Pulse 70  Temp(Src) 97.8 F (36.6 C) (Oral)  Ht 5' 2" (1.575 m)  Wt 131 lb (59.421 kg)  BMI 23.95 kg/m2  EKG: Irregular heartbeat with  Multifocal PACs  I called and spoke with Dr. Percival Spanish the cardiologist and he will make arrangements to see the patient sometime  soon     Assessment & Plan:  1. Irregular heart beat - EKG  12-Lead - BMP8+EGFR - Thyroid Panel With TSH  2. Atrial fibrillation, chronic - BMP8+EGFR - Thyroid Panel With TSH  3. Essential hypertension, benign - BMP8+EGFR - Thyroid Panel With TSH  Patient Instructions  We will call you with the thyroid and BMP results as sedated as they are available We will schedule you for a visit with Dr. Jodi Mourning in for followup Continue regular activity Continue to watch her caffeine intake   Arrie Senate MD

## 2014-07-06 LAB — BMP8+EGFR
BUN / CREAT RATIO: 28 — AB (ref 11–26)
BUN: 28 mg/dL — AB (ref 8–27)
CO2: 28 mmol/L (ref 18–29)
CREATININE: 1 mg/dL (ref 0.57–1.00)
Calcium: 9.4 mg/dL (ref 8.7–10.3)
Chloride: 101 mmol/L (ref 97–108)
GFR calc Af Amer: 61 mL/min/{1.73_m2} (ref >59)
GFR, EST NON AFRICAN AMERICAN: 53 mL/min/{1.73_m2} — AB (ref >59)
Glucose: 77 mg/dL (ref 65–99)
Potassium: 4.6 mmol/L (ref 3.5–5.2)
SODIUM: 142 mmol/L (ref 134–144)

## 2014-07-06 LAB — THYROID PANEL WITH TSH
FREE THYROXINE INDEX: 1.8 (ref 1.2–4.9)
T3 UPTAKE RATIO: 27 % (ref 24–39)
T4 TOTAL: 6.7 ug/dL (ref 4.5–12.0)
TSH: 2.22 u[IU]/mL (ref 0.450–4.500)

## 2014-07-16 ENCOUNTER — Encounter: Payer: Self-pay | Admitting: Interventional Cardiology

## 2014-08-03 ENCOUNTER — Encounter: Payer: Self-pay | Admitting: Cardiology

## 2014-08-03 ENCOUNTER — Ambulatory Visit (INDEPENDENT_AMBULATORY_CARE_PROVIDER_SITE_OTHER): Payer: Medicare Other | Admitting: Cardiology

## 2014-08-03 VITALS — BP 193/87 | HR 67 | Ht 62.0 in | Wt 131.0 lb

## 2014-08-03 DIAGNOSIS — I4891 Unspecified atrial fibrillation: Secondary | ICD-10-CM

## 2014-08-03 DIAGNOSIS — I482 Chronic atrial fibrillation, unspecified: Secondary | ICD-10-CM

## 2014-08-03 DIAGNOSIS — I10 Essential (primary) hypertension: Secondary | ICD-10-CM

## 2014-08-03 NOTE — Progress Notes (Signed)
HPI The patient she presents for evaluation of atrial fibrillation. She was having some palpitations in August an EKG at Dr. Tawanna Sat office. The computer interpreted as atrial fibrillation. However, I reviewed this with sinus rhythm with PACs. She has some short bouts of palpitations but she's not had any presyncope or syncope. She's not had any new overt symptoms.  She still does some golfing and some household chores but she doesn't exercise routinely. However, with her activity she denies any acute cardiovascular symptoms. The patient denies any new symptoms such as chest discomfort, neck or arm discomfort. There has been no new shortness of breath, PND or orthopnea. There have been no reported palpitations, presyncope or syncope.   She has had a 24-hour blood pressure monitor past with some spiking blood pressures but her readings at home are the most part very good and I reviewed his diary today.  Allergies  Allergen Reactions  . Benazepril Hcl Cough    Current Outpatient Prescriptions  Medication Sig Dispense Refill  . amLODipine (NORVASC) 5 MG tablet TAKE ONE-HALF TO ONE TABLET BY MOUTH AT BEDTIME  90 tablet  1  . calcium-vitamin D (OSCAL WITH D) 250-125 MG-UNIT per tablet Take 1 tablet by mouth 2 (two) times daily.      . flecainide (TAMBOCOR) 150 MG tablet TAKE ONE-HALF TABLET BY MOUTH EVERY 12 HOURS  90 tablet  0  . furosemide (LASIX) 20 MG tablet Take 20 mg by mouth daily.      Marland Kitchen lisinopril (PRINIVIL,ZESTRIL) 40 MG tablet Take 1 tablet (40 mg total) by mouth daily.  90 tablet  1  . Multiple Vitamin (MULTIVITAMIN) capsule Take 1 capsule by mouth daily.      . pindolol (VISKEN) 5 MG tablet Take 1 tablet (5 mg total) by mouth 2 (two) times daily.  180 tablet  1  . potassium chloride SA (K-DUR,KLOR-CON) 20 MEQ tablet Take 1 tablet (20 mEq total) by mouth daily.  90 tablet  1  . warfarin (COUMADIN) 5 MG tablet TAKE ONE & ONE-HALF TABLETS BY MOUTH ONCE DAILY OR  AS  DIRECTED  90 tablet   0   No current facility-administered medications for this visit.    Past Medical History  Diagnosis Date  . Menopause   . A-fib     04/09/13-nuclear stress-no ischemia low risk  . Hypertension   . Osteoporosis   . Diverticulosis   . Cataract   . Atrial fibrillation   . Bradycardia 2014  . Pelvic fracture     pelvic belt    Past Surgical History  Procedure Laterality Date  . Cataract extraction    . Biopsy breast      ROS:  .As stated in the HPI and negative for all other systems.  PHYSICAL EXAM BP 193/87  Pulse 67  Ht 5\' 2"  (1.575 m)  Wt 131 lb (59.421 kg)  BMI 23.95 kg/m2 GENERAL:  Well appearing HEENT:  Pupils equal round and reactive, fundi not visualized, oral mucosa unremarkable NECK:  No jugular venous distention, waveform within normal limits, carotid upstroke brisk and symmetric, no bruits, no thyromegaly LYMPHATICS:  No cervical, inguinal adenopathy LUNGS:  Clear to auscultation bilaterally BACK:  No CVA tenderness CHEST:  Unremarkable HEART:  PMI not displaced or sustained,S1 and S2 within normal limits, no S3, no S4, no clicks, no rubs, no murmurs ABD:  Flat, positive bowel sounds normal in frequency in pitch, positive midline bruits, no rebound, no guarding, no midline pulsatile mass, no  hepatomegaly, no splenomegaly EXT:  2 plus pulses throughout, no edema, no cyanosis no clubbing SKIN:  No rashes no nodules NEURO:  Cranial nerves II through XII grossly intact, motor grossly intact throughout PSYCH:  Cognitively intact, oriented to person place and time   ASSESSMENT AND PLAN   HTN:  Her blood pressure is elevated here. However, I have reviewed her home readings and she shortly that her cuff is okay.  Her blood pressure readings at home are excellent. She will keep an eye on this and for now there is no change in medications.  ABDOMINAL BRUIT:   She had no aneurysm on ultrasound last fall. No change in therapy is indicated.  ATRIAL FIB:  She is  tolerating the medications as listed. Flecainide level was low at 2 think it's therapeutic. She's had palpitations but the EKG done in August which I reviewed did not demonstrate fibrillation as the computer read. He was sinus with frequent atrial ectopy. She may be having short bouts of atrial fib and is clearly having atrial ectopy. However, these are not typically symptomatic and no change in therapy is indicated.  BRADYCARDIA:  This has not been an issue. No change in therapy is indicated.

## 2014-08-03 NOTE — Patient Instructions (Signed)
The current medical regimen is effective;  continue present plan and medications.  Follow up in 6 months with Dr. Percival Spanish in [place].  You will receive a letter in the mail 2 months before you are due.  Please call us when you receive this letter to schedule your follow up appointment.

## 2014-08-08 ENCOUNTER — Ambulatory Visit (INDEPENDENT_AMBULATORY_CARE_PROVIDER_SITE_OTHER): Payer: Medicare Other | Admitting: Family Medicine

## 2014-08-08 ENCOUNTER — Encounter: Payer: Self-pay | Admitting: Family Medicine

## 2014-08-08 VITALS — BP 170/71 | HR 56 | Temp 97.3°F | Ht 62.0 in | Wt 132.0 lb

## 2014-08-08 DIAGNOSIS — I10 Essential (primary) hypertension: Secondary | ICD-10-CM

## 2014-08-08 DIAGNOSIS — H04129 Dry eye syndrome of unspecified lacrimal gland: Secondary | ICD-10-CM

## 2014-08-08 DIAGNOSIS — H04123 Dry eye syndrome of bilateral lacrimal glands: Secondary | ICD-10-CM

## 2014-08-08 DIAGNOSIS — I482 Chronic atrial fibrillation, unspecified: Secondary | ICD-10-CM

## 2014-08-08 DIAGNOSIS — I4891 Unspecified atrial fibrillation: Secondary | ICD-10-CM

## 2014-08-08 LAB — POCT INR: INR: 2.5

## 2014-08-08 NOTE — Patient Instructions (Addendum)
Anticoagulation Dose Instructions as of 08/08/2014     Susan Fitzgerald Tue Wed Thu Fri Sat   New Dose 5 mg 5 mg 5 mg 5 mg 5 mg 5 mg 5 mg    Description       Continue warfarin 5mg  1 tablet daily.                               Medicare Annual Wellness Visit  [facility] and the medical providers at [facility] strive to bring you the best medical care.  In doing so we not only want to address your current medical conditions and concerns but also to detect new conditions early and prevent illness, disease and health-related problems.    Medicare offers a yearly Wellness Visit which allows our clinical staff to assess your need for preventative services including immunizations, lifestyle education, counseling to decrease risk of preventable diseases and screening for fall risk and other medical concerns.    This visit is provided free of charge (no copay) for all Medicare recipients. The clinical pharmacists at [facility] have begun to conduct these Wellness Visits which will also include a thorough review of all your medications.    As you primary medical provider recommend that you make an appointment for your Annual Wellness Visit if you have not done so already this year.  You may set up this appointment before you leave today or you may call back [phone]) and schedule an appointment.  Please make sure when you call that you mention that you are scheduling your Annual Wellness Visit with the clinical pharmacist so that the appointment may be made for the proper length of time.      Continue current medications. Continue good therapeutic lifestyle changes which include good diet and exercise. Fall precautions discussed with patient. If an FOBT was given today- please return it to our front desk. If you are over 78 years old - you may need Prevnar 66 or the adult Pneumonia vaccine.  Flu Shots will be available at our office starting mid-  September. Please call and schedule a FLU CLINIC APPOINTMENT.   Return the FOBT Use Visine eyedrops or refresh for dry eye Remember to avoid scented detergents fabric softeners and soaps Use a cool mist humidifier this winter

## 2014-08-08 NOTE — Progress Notes (Signed)
Subjective:    Patient ID: Susan Fitzgerald, female    DOB: 1932/01/24, 80 y.o.   MRN: [id number]  HPI Pt here for follow up and management of chronic medical problems. The patient complains today of dry eyes. The patient just saw a cardiologist last week. He was pleased with the visit. He reviewed home blood pressures and these were excellent and the patient apparently runs higher blood pressure readings when she is seeing the physician. Copies of recent lab work was reviewed and given to the patient.         Patient Active Problem List   Diagnosis Date Noted  . Symptomatic bradycardia 06/02/2013  . Hypertension 04/21/2013  . Osteoporosis 04/21/2013  . Atrial fibrillation, chronic 02/09/2013   Outpatient Encounter Prescriptions as of 08/08/2014  Medication Sig  . amLODipine (NORVASC) 5 MG tablet TAKE ONE-HALF TO ONE TABLET BY MOUTH AT BEDTIME  . calcium-vitamin D (OSCAL WITH D) 250-125 MG-UNIT per tablet Take 1 tablet by mouth 2 (two) times daily.  . flecainide (TAMBOCOR) 150 MG tablet TAKE ONE-HALF TABLET BY MOUTH EVERY 12 HOURS  . furosemide (LASIX) 20 MG tablet Take 20 mg by mouth daily.  Marland Kitchen lisinopril (PRINIVIL,ZESTRIL) 40 MG tablet Take 1 tablet (40 mg total) by mouth daily.  . Multiple Vitamin (MULTIVITAMIN) capsule Take 1 capsule by mouth daily.  . pindolol (VISKEN) 5 MG tablet Take 1 tablet (5 mg total) by mouth 2 (two) times daily.  . potassium chloride SA (K-DUR,KLOR-CON) 20 MEQ tablet Take 1 tablet (20 mEq total) by mouth daily.  Marland Kitchen warfarin (COUMADIN) 5 MG tablet TAKE ONE & ONE-HALF TABLETS BY MOUTH ONCE DAILY OR  AS  DIRECTED    Review of Systems  Constitutional: Negative.   HENT: Negative.   Eyes: Negative.        Dry eyes  Respiratory: Negative.   Cardiovascular: Negative.   Gastrointestinal: Negative.   Endocrine: Negative.   Genitourinary: Negative.   Musculoskeletal: Negative.   Skin: Negative.   Allergic/Immunologic: Negative.   Neurological: Negative.     Hematological: Negative.   Psychiatric/Behavioral: Negative.        Objective:   Physical Exam  Nursing note and vitals reviewed. Constitutional: She is oriented to person, place, and time. She appears well-developed and well-nourished.  The patient was pleasant and alert and appearing much younger than her stated age of 62   HENT:  Head: Normocephalic and atraumatic.  Right Ear: External ear normal.  Left Ear: External ear normal.  Nose: Nose normal.  Mouth/Throat: Oropharynx is clear and moist. No oropharyngeal exudate.  Eyes: Conjunctivae and EOM are normal. Pupils are equal, round, and reactive to light. Right eye exhibits no discharge. Left eye exhibits no discharge. No scleral icterus.  There is no ocular redness or discharge from eyes  Neck: Normal range of motion. Neck supple. No thyromegaly present.  Cardiovascular: Normal rate, regular rhythm, normal heart sounds and intact distal pulses.  Exam reveals no gallop and no friction rub.   No murmur heard. The heart had a regular rate and rhythm today at 72 per minute  Pulmonary/Chest: Effort normal and breath sounds normal. No respiratory distress. She has no wheezes. She has no rales. She exhibits no tenderness.  Abdominal: Soft. Bowel sounds are normal. She exhibits no mass. There is no tenderness. There is no rebound and no guarding.  Musculoskeletal: Normal range of motion. She exhibits no edema and no tenderness.  Lymphadenopathy:    She has no cervical adenopathy.  Neurological: She is alert and oriented to person, place, and time. She has normal reflexes. No cranial nerve deficit.  Skin: Skin is warm and dry. No rash noted.  Psychiatric: She has a normal mood and affect. Her behavior is normal. Judgment and thought content normal.   BP 170/71  Pulse 56  Temp(Src) 97.3 F (36.3 C) (Oral)  Ht 5\' 2"  (1.575 m)  Wt 132 lb (59.875 kg)  BMI 24.14 kg/m2        Assessment & Plan:  1. Atrial fibrillation,  chronic  2. Essential hypertension  3. Atrial fibrillation - POCT INR  4. Dry eyes, bilateral   Patient Instructions                       Medicare Annual Wellness Visit  [facility] and the medical providers at [facility] strive to bring you the best medical care.  In doing so we not only want to address your current medical conditions and concerns but also to detect new conditions early and prevent illness, disease and health-related problems.    Medicare offers a yearly Wellness Visit which allows our clinical staff to assess your need for preventative services including immunizations, lifestyle education, counseling to decrease risk of preventable diseases and screening for fall risk and other medical concerns.    This visit is provided free of charge (no copay) for all Medicare recipients. The clinical pharmacists at [facility] have begun to conduct these Wellness Visits which will also include a thorough review of all your medications.    As you primary medical provider recommend that you make an appointment for your Annual Wellness Visit if you have not done so already this year.  You may set up this appointment before you leave today or you may call back [phone] and schedule an appointment.  Please make sure when you call that you mention that you are scheduling your Annual Wellness Visit with the clinical pharmacist so that the appointment may be made for the proper length of time.      Continue current medications. Continue good therapeutic lifestyle changes which include good diet and exercise. Fall precautions discussed with patient. If an FOBT was given today- please return it to our front desk. If you are over 73 years old - you may need Prevnar 68 or the adult Pneumonia vaccine.  Flu Shots will be available at our office starting mid- September. Please call and schedule a FLU CLINIC APPOINTMENT.   Return the FOBT Use  Visine eyedrops or refresh for dry eye Remember to avoid scented detergents fabric softeners and soaps Use a cool mist humidifier this winter   Arrie Senate MD

## 2014-08-11 ENCOUNTER — Other Ambulatory Visit: Payer: Medicare Other

## 2014-08-11 DIAGNOSIS — Z1212 Encounter for screening for malignant neoplasm of rectum: Secondary | ICD-10-CM

## 2014-08-12 LAB — FECAL OCCULT BLOOD, IMMUNOCHEMICAL: Fecal Occult Bld: NEGATIVE

## 2014-08-21 ENCOUNTER — Other Ambulatory Visit: Payer: Self-pay | Admitting: Family Medicine

## 2014-09-05 ENCOUNTER — Ambulatory Visit (INDEPENDENT_AMBULATORY_CARE_PROVIDER_SITE_OTHER): Payer: Medicare Other | Admitting: Pharmacist

## 2014-09-05 ENCOUNTER — Ambulatory Visit (INDEPENDENT_AMBULATORY_CARE_PROVIDER_SITE_OTHER): Payer: Medicare Other

## 2014-09-05 VITALS — BP 144/62 | HR 66

## 2014-09-05 DIAGNOSIS — I482 Chronic atrial fibrillation, unspecified: Secondary | ICD-10-CM

## 2014-09-05 DIAGNOSIS — I4891 Unspecified atrial fibrillation: Secondary | ICD-10-CM

## 2014-09-05 DIAGNOSIS — Z23 Encounter for immunization: Secondary | ICD-10-CM

## 2014-09-05 LAB — POCT INR: INR: 3.1

## 2014-09-05 MED ORDER — FLECAINIDE ACETATE 150 MG PO TABS: 75.0000 mg | ORAL_TABLET | Freq: Two times a day (BID) | ORAL | Status: DC

## 2014-09-05 NOTE — Patient Instructions (Signed)
Anticoagulation Dose Instructions as of 09/05/2014     Susan Fitzgerald Tue Wed Thu Fri Sat   New Dose 5 mg 5 mg 5 mg 5 mg 5 mg 5 mg 5 mg    Description       No warfarin today - Monday, October 19th, then continue warfarin 5mg  1 tablet daily.       INR was 3.1 today

## 2014-09-29 ENCOUNTER — Other Ambulatory Visit: Payer: Self-pay

## 2014-09-29 MED ORDER — FUROSEMIDE 20 MG PO TABS: 20.0000 mg | ORAL_TABLET | Freq: Every day | ORAL | Status: DC

## 2014-10-10 ENCOUNTER — Encounter: Payer: Self-pay | Admitting: Pharmacist

## 2014-10-10 ENCOUNTER — Ambulatory Visit (INDEPENDENT_AMBULATORY_CARE_PROVIDER_SITE_OTHER): Payer: Medicare Other | Admitting: Pharmacist

## 2014-10-10 VITALS — BP 138/70

## 2014-10-10 DIAGNOSIS — I4891 Unspecified atrial fibrillation: Secondary | ICD-10-CM

## 2014-10-10 DIAGNOSIS — I482 Chronic atrial fibrillation, unspecified: Secondary | ICD-10-CM

## 2014-10-10 LAB — POCT INR: INR: 2.3

## 2014-10-10 NOTE — Patient Instructions (Signed)
Anticoagulation Dose Instructions as of 10/10/2014      Susan Fitzgerald Tue Wed Thu Fri Sat   New Dose 5 mg 5 mg 5 mg 5 mg 5 mg 5 mg 5 mg    Description        Continue warfarin 5mg  1 tablet daily.       INR was 2.3 today

## 2014-10-14 ENCOUNTER — Other Ambulatory Visit: Payer: Self-pay | Admitting: Family Medicine

## 2014-11-15 ENCOUNTER — Other Ambulatory Visit: Payer: Self-pay | Admitting: Family Medicine

## 2014-11-16 ENCOUNTER — Other Ambulatory Visit: Payer: Self-pay

## 2014-11-16 MED ORDER — LISINOPRIL 40 MG PO TABS: 40.0000 mg | ORAL_TABLET | Freq: Every day | ORAL | Status: DC

## 2014-11-24 ENCOUNTER — Encounter: Payer: Self-pay | Admitting: Pharmacist

## 2014-11-24 ENCOUNTER — Ambulatory Visit (INDEPENDENT_AMBULATORY_CARE_PROVIDER_SITE_OTHER): Payer: Medicare Other | Admitting: Pharmacist

## 2014-11-24 VITALS — BP 128/72

## 2014-11-24 DIAGNOSIS — I4891 Unspecified atrial fibrillation: Secondary | ICD-10-CM | POA: Diagnosis not present

## 2014-11-24 DIAGNOSIS — I482 Chronic atrial fibrillation, unspecified: Secondary | ICD-10-CM

## 2014-11-24 LAB — POCT INR: INR: 2.4

## 2014-11-24 MED ORDER — POTASSIUM CHLORIDE CRYS ER 20 MEQ PO TBCR: 20.0000 meq | EXTENDED_RELEASE_TABLET | Freq: Every day | ORAL | Status: DC

## 2014-11-24 NOTE — Patient Instructions (Signed)
Anticoagulation Dose Instructions as of 11/24/2014      Susan Fitzgerald Tue Wed Thu Fri Sat   New Dose 5 mg 5 mg 5 mg 5 mg 5 mg 5 mg 5 mg    Description        Continue warfarin 5mg  1 tablet daily.       INR was 2.4 today

## 2014-12-12 ENCOUNTER — Other Ambulatory Visit (INDEPENDENT_AMBULATORY_CARE_PROVIDER_SITE_OTHER): Payer: Medicare Other

## 2014-12-12 ENCOUNTER — Other Ambulatory Visit: Payer: Self-pay

## 2014-12-12 DIAGNOSIS — Z79899 Other long term (current) drug therapy: Secondary | ICD-10-CM | POA: Diagnosis not present

## 2014-12-12 DIAGNOSIS — R001 Bradycardia, unspecified: Secondary | ICD-10-CM

## 2014-12-12 DIAGNOSIS — I10 Essential (primary) hypertension: Secondary | ICD-10-CM | POA: Diagnosis not present

## 2014-12-12 DIAGNOSIS — M810 Age-related osteoporosis without current pathological fracture: Secondary | ICD-10-CM | POA: Diagnosis not present

## 2014-12-12 DIAGNOSIS — R5383 Other fatigue: Secondary | ICD-10-CM | POA: Diagnosis not present

## 2014-12-12 DIAGNOSIS — R531 Weakness: Secondary | ICD-10-CM | POA: Diagnosis not present

## 2014-12-12 DIAGNOSIS — I498 Other specified cardiac arrhythmias: Secondary | ICD-10-CM | POA: Diagnosis not present

## 2014-12-12 LAB — POCT CBC
GRANULOCYTE PERCENT: 63.9 % (ref 37–80)
HCT, POC: 41.7 % (ref 37.7–47.9)
Hemoglobin: 12.8 g/dL (ref 12.2–16.2)
Lymph, poc: 1.6 (ref 0.6–3.4)
MCH, POC: 28.6 pg (ref 27–31.2)
MCHC: 30.6 g/dL — AB (ref 31.8–35.4)
MCV: 93.4 fL (ref 80–97)
MPV: 8 fL (ref 0–99.8)
POC GRANULOCYTE: 3.2 (ref 2–6.9)
POC LYMPH %: 32.1 % (ref 10–50)
Platelet Count, POC: 176 10*3/uL (ref 142–424)
RBC: 4.5 M/uL (ref 4.04–5.48)
RDW, POC: 13.7 %
WBC: 5 10*3/uL (ref 4.6–10.2)

## 2014-12-13 LAB — HEPATIC FUNCTION PANEL
ALK PHOS: 74 IU/L (ref 39–117)
ALT: 17 IU/L (ref 0–32)
AST: 25 IU/L (ref 0–40)
Albumin: 3.7 g/dL (ref 3.5–4.7)
Bilirubin, Direct: 0.1 mg/dL (ref 0.00–0.40)
Total Bilirubin: 0.4 mg/dL (ref 0.0–1.2)
Total Protein: 6.5 g/dL (ref 6.0–8.5)

## 2014-12-13 LAB — BMP8+EGFR
BUN/Creatinine Ratio: 23 (ref 11–26)
BUN: 23 mg/dL (ref 8–27)
CO2: 27 mmol/L (ref 18–29)
CREATININE: 0.99 mg/dL (ref 0.57–1.00)
Calcium: 9.1 mg/dL (ref 8.7–10.3)
Chloride: 104 mmol/L (ref 97–108)
GFR calc non Af Amer: 54 mL/min/{1.73_m2} — ABNORMAL LOW (ref >59)
GFR, EST AFRICAN AMERICAN: 62 mL/min/{1.73_m2} (ref >59)
Glucose: 78 mg/dL (ref 65–99)
Potassium: 4.5 mmol/L (ref 3.5–5.2)
Sodium: 144 mmol/L (ref 134–144)

## 2014-12-13 LAB — VITAMIN D 25 HYDROXY (VIT D DEFICIENCY, FRACTURES): VIT D 25 HYDROXY: 48.6 ng/mL (ref 30.0–100.0)

## 2014-12-13 LAB — LIPID PANEL
Chol/HDL Ratio: 2.6 ratio units (ref 0.0–4.4)
Cholesterol, Total: 177 mg/dL (ref 100–199)
HDL: 69 mg/dL (ref >39)
LDL Calculated: 93 mg/dL (ref 0–99)
Triglycerides: 73 mg/dL (ref 0–149)
VLDL CHOLESTEROL CAL: 15 mg/dL (ref 5–40)

## 2014-12-19 ENCOUNTER — Ambulatory Visit (INDEPENDENT_AMBULATORY_CARE_PROVIDER_SITE_OTHER): Payer: Medicare Other

## 2014-12-19 ENCOUNTER — Other Ambulatory Visit: Payer: Self-pay | Admitting: Family Medicine

## 2014-12-19 ENCOUNTER — Encounter: Payer: Self-pay | Admitting: Family Medicine

## 2014-12-19 ENCOUNTER — Ambulatory Visit (INDEPENDENT_AMBULATORY_CARE_PROVIDER_SITE_OTHER): Payer: Medicare Other | Admitting: Family Medicine

## 2014-12-19 VITALS — BP 178/83 | HR 68 | Temp 97.8°F | Ht 62.0 in | Wt 136.0 lb

## 2014-12-19 DIAGNOSIS — G4700 Insomnia, unspecified: Secondary | ICD-10-CM

## 2014-12-19 DIAGNOSIS — I482 Chronic atrial fibrillation, unspecified: Secondary | ICD-10-CM

## 2014-12-19 DIAGNOSIS — M79645 Pain in left finger(s): Secondary | ICD-10-CM

## 2014-12-19 DIAGNOSIS — I10 Essential (primary) hypertension: Secondary | ICD-10-CM | POA: Diagnosis not present

## 2014-12-19 DIAGNOSIS — R52 Pain, unspecified: Secondary | ICD-10-CM

## 2014-12-19 DIAGNOSIS — Z79899 Other long term (current) drug therapy: Secondary | ICD-10-CM

## 2014-12-19 DIAGNOSIS — G629 Polyneuropathy, unspecified: Secondary | ICD-10-CM | POA: Diagnosis not present

## 2014-12-19 LAB — POCT INR: INR: 1.9

## 2014-12-19 NOTE — Progress Notes (Addendum)
Subjective:    Patient ID: Susan Fitzgerald, female    DOB: 1932/11/05, 81 y.o.   MRN: [id number]  HPI Pt here for follow up and management of chronic medical problems which include atrial fibrillation and hypertension. She is taking medications regularly. The patient has been feeling well except she does complain of some arthralgias and a needle sticking sensation in her right leg at times. The patient has had recent lab work done. This has been reviewed with her today and her kidney function and electrolytes were good. The triglycerides were good and the LDL C was 93 and she should try to work at getting this to a lower level and she understands this. All liver function tests were normal and the vitamin D level was good at 48.6. She should continue with her current vitamin D. The patient brings in home blood pressures for review and the majority of these were good. She had an episode about a month ago when her blood pressures dropped. Low and she did not realize until after a few days if she is taking 8 mg of lisinopril a day instead of 40 mg daily. This is been corrected and the blood pressures have been stable since that episode. The leg discomfort that the patient complains of is most notable when she rolls over in the bed at night and only last first few seconds. She also complains of some left hand pain especially with gripping juice containers etc. she is concerned that this will play a role with her coughing in the spring time. She also complains of insomnia.        Patient Active Problem List   Diagnosis Date Noted  . Symptomatic bradycardia 06/02/2013  . Hypertension 04/21/2013  . Osteoporosis 04/21/2013  . Atrial fibrillation, chronic 02/09/2013   Outpatient Encounter Prescriptions as of 12/19/2014  Medication Sig  . amLODipine (NORVASC) 5 MG tablet TAKE ONE-HALF TO ONE TABLET BY MOUTH AT BEDTIME  . calcium-vitamin D (OSCAL WITH D) 250-125 MG-UNIT per tablet Take 1 tablet by mouth 2  (two) times daily.  . flecainide (TAMBOCOR) 150 MG tablet Take 0.5 tablets (75 mg total) by mouth 2 (two) times daily.  . furosemide (LASIX) 20 MG tablet Take 1 tablet (20 mg total) by mouth daily.  Marland Kitchen lisinopril (PRINIVIL,ZESTRIL) 40 MG tablet Take 1 tablet (40 mg total) by mouth daily.  . Multiple Vitamin (MULTIVITAMIN) capsule Take 1 capsule by mouth daily.  . pindolol (VISKEN) 5 MG tablet TAKE ONE TABLET BY MOUTH TWICE DAILY  . potassium chloride SA (K-DUR,KLOR-CON) 20 MEQ tablet Take 1 tablet (20 mEq total) by mouth daily.  Marland Kitchen warfarin (COUMADIN) 5 MG tablet Take 1 tablet by mouth daily or as directed.    Review of Systems  Constitutional: Negative.   HENT: Negative.   Eyes: Negative.   Respiratory: Negative.   Cardiovascular: Negative.   Gastrointestinal: Negative.   Endocrine: Negative.   Genitourinary: Negative.   Musculoskeletal: Positive for arthralgias (right leg pain at times - "needle" sensation).  Skin: Negative.   Allergic/Immunologic: Negative.   Neurological: Negative.   Hematological: Negative.   Psychiatric/Behavioral: Negative.        Objective:   Physical Exam  Constitutional: She is oriented to person, place, and time. She appears well-developed and well-nourished.  The patient is alert and pleasant.  HENT:  Head: Normocephalic and atraumatic.  Right Ear: External ear normal.  Left Ear: External ear normal.  Nose: Nose normal.  Mouth/Throat: Oropharynx is clear and  moist.  Eyes: Conjunctivae and EOM are normal. Pupils are equal, round, and reactive to light. Right eye exhibits no discharge. Left eye exhibits no discharge. No scleral icterus.  Neck: Normal range of motion. Neck supple. No thyromegaly present.  No carotid bruits or anterior cervical adenopathy  Cardiovascular: Normal rate, normal heart sounds and intact distal pulses.   No murmur heard. The rhythm is irregular irregular at 72/m  Pulmonary/Chest: Effort normal and breath sounds normal. No  respiratory distress. She has no wheezes. She has no rales. She exhibits no tenderness.  Abdominal: Soft. Bowel sounds are normal. She exhibits no mass. There is no tenderness. There is no rebound and no guarding.  The abdomen and suprapubic area are nontender without masses.  Musculoskeletal: Normal range of motion. She exhibits no edema or tenderness.  The patient had good leg raising and hip abduction bilaterally. There was also no point tenderness of pain over the left thenar muscle.  Lymphadenopathy:    She has no cervical adenopathy.  Neurological: She is alert and oriented to person, place, and time. She has normal reflexes. No cranial nerve deficit.  Skin: Skin is warm and dry. No rash noted.  Psychiatric: She has a normal mood and affect. Her behavior is normal. Judgment and thought content normal.  Nursing note and vitals reviewed.  BP 178/83 mmHg  Pulse 68  Temp(Src) 97.8 F (36.6 C) (Oral)  Ht 5\' 2"  (1.575 m)  Wt 136 lb (61.689 kg)  BMI 24.87 kg/m2  [facility] reading (PRIMARY) by  Dr. Louretta Parma hand-- arthritic changes at first MCP joint                                       Assessment & Plan:  1. Atrial fibrillation, chronic -Continue follow-up with cardiology -Continue current treatment  2. Essential hypertension -Continue current blood pressure treatment and monitoring blood pressures at home -Please bring readings to the doctor's visit and to the cardiology visit  3. High risk medication use -Continue Coumadin per pro time visits  4. Atrial fibrillation -Continue Coumadin and current treatment for her heart irregularity as well as follow-up visit with cardiology - POCT INR  5. Insomnia -Try melatonin 3 mg at bedtime  6. Neuropathy -If problems continue and the right anterior leg, get LS spine films  7. Pain in finger of left hand -X-ray today  Patient Instructions                       Medicare Annual Wellness Visit  [facility] and the medical  providers at [facility] strive to bring you the best medical care.  In doing so we not only want to address your current medical conditions and concerns but also to detect new conditions early and prevent illness, disease and health-related problems.    Medicare offers a yearly Wellness Visit which allows our clinical staff to assess your need for preventative services including immunizations, lifestyle education, counseling to decrease risk of preventable diseases and screening for fall risk and other medical concerns.    This visit is provided free of charge (no copay) for all Medicare recipients. The clinical pharmacists at [facility] have begun to conduct these Wellness Visits which will also include a thorough review of all your medications.    As you primary medical provider recommend that you make an appointment for your  Annual Wellness Visit if you have not done so already this year.  You may set up this appointment before you leave today or you may call back [phone] and schedule an appointment.  Please make sure when you call that you mention that you are scheduling your Annual Wellness Visit with the clinical pharmacist so that the appointment may be made for the proper length of time.     Continue current medications. Continue good therapeutic lifestyle changes which include good diet and exercise. Fall precautions discussed with patient. If an FOBT was given today- please return it to our front desk. If you are over 33 years old - you may need Prevnar 62 or the adult Pneumonia vaccine.  Flu Shots are still available at our office. If you still haven't had one please call to set up a nurse visit to get one.   After your visit with [place] today you will receive a survey in the mail or online from [employer] regarding your care with [place]. Please take a moment to fill this out. Your feedback is very important to [place] as you can help [place] better  understand your patient needs as well as improve your experience and satisfaction. WE CARE ABOUT YOU!!!   We will call you with the results of the x-rays once those results become available Continue to monitor your blood pressures at home Always check your medications when he picked him up from the pharmacy and make sure they are correct You can try melatonin, 3 mg over-the-counter one at bedtime as needed for sleep If the problem with the tingling sensation in the leg gets worse you need to have LS spine films   Arrie Senate MD

## 2014-12-19 NOTE — Patient Instructions (Addendum)
Medicare Annual Wellness Visit  [facility] and the medical providers at [facility] strive to bring you the best medical care.  In doing so we not only want to address your current medical conditions and concerns but also to detect new conditions early and prevent illness, disease and health-related problems.    Medicare offers a yearly Wellness Visit which allows our clinical staff to assess your need for preventative services including immunizations, lifestyle education, counseling to decrease risk of preventable diseases and screening for fall risk and other medical concerns.    This visit is provided free of charge (no copay) for all Medicare recipients. The clinical pharmacists at [facility] have begun to conduct these Wellness Visits which will also include a thorough review of all your medications.    As you primary medical provider recommend that you make an appointment for your Annual Wellness Visit if you have not done so already this year.  You may set up this appointment before you leave today or you may call back [phone] and schedule an appointment.  Please make sure when you call that you mention that you are scheduling your Annual Wellness Visit with the clinical pharmacist so that the appointment may be made for the proper length of time.     Continue current medications. Continue good therapeutic lifestyle changes which include good diet and exercise. Fall precautions discussed with patient. If an FOBT was given today- please return it to our front desk. If you are over 20 years old - you may need Prevnar 40 or the adult Pneumonia vaccine.  Flu Shots are still available at our office. If you still haven't had one please call to set up a nurse visit to get one.   After your visit with [place] today you will receive a survey in the mail or online from [employer] regarding your care with [place]. Please take a moment to  fill this out. Your feedback is very important to [place] as you can help [place] better understand your patient needs as well as improve your experience and satisfaction. WE CARE ABOUT YOU!!!   We will call you with the results of the x-rays once those results become available Continue to monitor your blood pressures at home Always check your medications when he picked him up from the pharmacy and make sure they are correct You can try melatonin, 3 mg over-the-counter one at bedtime as needed for sleep If the problem with the tingling sensation in the leg gets worse you need to have LS spine films   Anticoagulation Dose Instructions as of 12/19/2014      Dorene Grebe Tue Wed Thu Fri Sat   New Dose 5 mg 5 mg 5 mg 5 mg 5 mg 5 mg 5 mg    Description        Take 1 and 1/2 tablets today, then continue warfarin 5mg  1 tablet daily.

## 2014-12-29 ENCOUNTER — Telehealth: Payer: Self-pay | Admitting: Family Medicine

## 2014-12-29 MED ORDER — PINDOLOL 5 MG PO TABS: 5.0000 mg | ORAL_TABLET | Freq: Two times a day (BID) | ORAL | Status: DC

## 2014-12-29 NOTE — Telephone Encounter (Signed)
Patient received letter from insurance that Pindolol has been reviewed and has been approved.  Patient requests rx sent to [facility] since she does not currently have refills.  Rx sent.

## 2015-01-21 ENCOUNTER — Ambulatory Visit (INDEPENDENT_AMBULATORY_CARE_PROVIDER_SITE_OTHER): Payer: Medicare Other | Admitting: Family

## 2015-01-21 VITALS — BP 174/81 | HR 68 | Temp 97.0°F | Ht 62.0 in | Wt 139.0 lb

## 2015-01-21 DIAGNOSIS — L03115 Cellulitis of right lower limb: Secondary | ICD-10-CM

## 2015-01-21 DIAGNOSIS — I739 Peripheral vascular disease, unspecified: Secondary | ICD-10-CM | POA: Diagnosis not present

## 2015-01-21 MED ORDER — CEPHALEXIN 500 MG PO CAPS
500.0000 mg | ORAL_CAPSULE | Freq: Four times a day (QID) | ORAL | Status: DC
Start: 2015-01-21 — End: 2015-01-26
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
## 2015-01-21 NOTE — Patient Instructions (Signed)
Peripheral Vascular Disease Peripheral Vascular Disease (PVD), also called Peripheral Arterial Disease (PAD), is a circulation problem caused by cholesterol (atherosclerotic plaque) deposits in the arteries. PVD commonly occurs in the lower extremities (legs) but it can occur in other areas of the body, such as your arms. The cholesterol buildup in the arteries reduces blood flow which can cause pain and other serious problems. The presence of PVD can place a person at risk for Coronary Artery Disease (CAD).  CAUSES  Causes of PVD can be many. It is usually associated with more than one risk factor such as:   High Cholesterol.  Smoking.  Diabetes.  Lack of exercise or inactivity.  High blood pressure (hypertension).  Obesity.  Family history. SYMPTOMS   When the lower extremities are affected, patients with PVD may experience:  Leg pain with exertion or physical activity. This is called INTERMITTENT CLAUDICATION. This may present as cramping or numbness with physical activity. The location of the pain is associated with the level of blockage. For example, blockage at the abdominal level (distal abdominal aorta) may result in buttock or hip pain. Lower leg arterial blockage may result in calf pain.  As PVD becomes more severe, pain can develop with less physical activity.  In people with severe PVD, leg pain may occur at rest.  Other PVD signs and symptoms:  Leg numbness or weakness.  Coldness in the affected leg or foot, especially when compared to the other leg.  A change in leg color.  Patients with significant PVD are more prone to ulcers or sores on toes, feet or legs. These may take longer to heal or may reoccur. The ulcers or sores can become infected.  If signs and symptoms of PVD are ignored, gangrene may occur. This can result in the loss of toes or loss of an entire limb.  Not all leg pain is related to PVD. Other medical conditions can cause leg pain such  as:  Blood clots (embolism) or Deep Vein Thrombosis.  Inflammation of the blood vessels (vasculitis).  Spinal stenosis. DIAGNOSIS  Diagnosis of PVD can involve several different types of tests. These can include:  Pulse Volume Recording Method (PVR). This test is simple, painless and does not involve the use of X-rays. PVR involves measuring and comparing the blood pressure in the arms and legs. An ABI (Ankle-Brachial Index) is calculated. The normal ratio of blood pressures is 1. As this number becomes smaller, it indicates more severe disease.  < 0.95 - indicates significant narrowing in one or more leg vessels.  <0.8 - there will usually be pain in the foot, leg or buttock with exercise.  <0.4 - will usually have pain in the legs at rest.  <0.25 - usually indicates limb threatening PVD.  Doppler detection of pulses in the legs. This test is painless and checks to see if you have a pulses in your legs/feet.  A dye or contrast material (a substance that highlights the blood vessels so they show up on x-ray) may be given to help your caregiver better see the arteries for the following tests. The dye is eliminated from your body by the kidney's. Your caregiver may order blood work to check your kidney function and other laboratory values before the following tests are performed:  Magnetic Resonance Angiography (MRA). An MRA is a picture study of the blood vessels and arteries. The MRA machine uses a large magnet to produce images of the blood vessels.  Computed Tomography Angiography (CTA). A CTA   is a specialized x-ray that looks at how the blood flows in your blood vessels. An IV may be inserted into your arm so contrast dye can be injected.  Angiogram. Is a procedure that uses x-rays to look at your blood vessels. This procedure is minimally invasive, meaning a small incision (cut) is made in your groin. A small tube (catheter) is then inserted into the artery of your groin. The catheter  is guided to the blood vessel or artery your caregiver wants to examine. Contrast dye is injected into the catheter. X-rays are then taken of the blood vessel or artery. After the images are obtained, the catheter is taken out. TREATMENT  Treatment of PVD involves many interventions which may include:  Lifestyle changes:  Quitting smoking.  Exercise.  Following a low fat, low cholesterol diet.  Control of diabetes.  Foot care is very important to the PVD patient. Good foot care can help prevent infection.  Medication:  Cholesterol-lowering medicine.  Blood pressure medicine.  Anti-platelet drugs.  Certain medicines may reduce symptoms of Intermittent Claudication.  Interventional/Surgical options:  Angioplasty. An Angioplasty is a procedure that inflates a balloon in the blocked artery. This opens the blocked artery to improve blood flow.  Stent Implant. A wire mesh tube (stent) is placed in the artery. The stent expands and stays in place, allowing the artery to remain open.  Peripheral Bypass Surgery. This is a surgical procedure that reroutes the blood around a blocked artery to help improve blood flow. This type of procedure may be performed if Angioplasty or stent implants are not an option. SEEK IMMEDIATE MEDICAL CARE IF:   You develop pain or numbness in your arms or legs.  Your arm or leg turns cold, becomes blue in color.  You develop redness, warmth, swelling and pain in your arms or legs. MAKE SURE YOU:   Understand these instructions.  Will watch your condition.  Will get help right away if you are not doing well or get worse. Document Released: 12/12/2004 Document Revised: 01/27/2012 Document Reviewed: 11/08/2008 [facility] Patient Information 2015 [place], [place]. This information is not intended to replace advice given to you by your health care provider. Make sure you discuss any questions you have with your health care provider. Cellulitis Cellulitis  is an infection of the skin and the tissue beneath it. The infected area is usually red and tender. Cellulitis occurs most often in the arms and lower legs.  CAUSES  Cellulitis is caused by bacteria that enter the skin through cracks or cuts in the skin. The most common types of bacteria that cause cellulitis are staphylococci and streptococci. SIGNS AND SYMPTOMS   Redness and warmth.  Swelling.  Tenderness or pain.  Fever. DIAGNOSIS  Your health care provider can usually determine what is wrong based on a physical exam. Blood tests may also be done. TREATMENT  Treatment usually involves taking an antibiotic medicine. HOME CARE INSTRUCTIONS   Take your antibiotic medicine as directed by your health care provider. Finish the antibiotic even if you start to feel better.  Keep the infected arm or leg elevated to reduce swelling.  Apply a warm cloth to the affected area up to 4 times per day to relieve pain.  Take medicines only as directed by your health care provider.  Keep all follow-up visits as directed by your health care provider. SEEK MEDICAL CARE IF:   You notice red streaks coming from the infected area.  Your red area gets larger or turns  dark in color.  Your bone or joint underneath the infected area becomes painful after the skin has healed.  Your infection returns in the same area or another area.  You notice a swollen bump in the infected area.  You develop new symptoms.  You have a fever. SEEK IMMEDIATE MEDICAL CARE IF:   You feel very sleepy.  You develop vomiting or diarrhea.  You have a general ill feeling (malaise) with muscle aches and pains. MAKE SURE YOU:   Understand these instructions.  Will watch your condition.  Will get help right away if you are not doing well or get worse. Document Released: 08/14/2005 Document Revised: 03/21/2014 Document Reviewed: 01/20/2012 [facility] Patient Information 2015 [place], [place]. This information is not  intended to replace advice given to you by your health care provider. Make sure you discuss any questions you have with your health care provider.

## 2015-01-23 ENCOUNTER — Telehealth: Payer: Self-pay | Admitting: Family

## 2015-01-23 ENCOUNTER — Encounter: Payer: Self-pay | Admitting: Family

## 2015-01-23 ENCOUNTER — Ambulatory Visit (INDEPENDENT_AMBULATORY_CARE_PROVIDER_SITE_OTHER): Payer: Medicare Other | Admitting: Family

## 2015-01-23 VITALS — BP 207/82 | HR 63 | Temp 97.6°F | Ht 62.0 in | Wt 136.6 lb

## 2015-01-23 DIAGNOSIS — I482 Chronic atrial fibrillation, unspecified: Secondary | ICD-10-CM

## 2015-01-23 DIAGNOSIS — S81801D Unspecified open wound, right lower leg, subsequent encounter: Secondary | ICD-10-CM

## 2015-01-23 NOTE — Patient Instructions (Signed)

## 2015-01-23 NOTE — Progress Notes (Signed)
   Subjective:    Patient ID: Susan Fitzgerald, female    DOB: 01-Jun-1932, 81 y.o.   MRN: [id number]  HPI Pt presents to the office to recheck leg wound. Pt was seen in the office on Saturday from a golf ball hit her right leg. Pt had moderate amount of swelling, redness, and a large blister present. Pt was started on 10 days of kelfex and told to keep elevated and to stay off of it as much as possible. Her leg was marked with a sharpie around her entire lower leg. Today, the swelling has went down about 2-3 inches from were it was previously marked. Pt states she is feeling better and states she feels as if her leg has improved.    Review of Systems  Constitutional: Negative.   HENT: Negative.   Eyes: Negative.   Respiratory: Negative.  Negative for shortness of breath.   Cardiovascular: Negative.  Negative for palpitations.  Gastrointestinal: Negative.   Endocrine: Negative.   Genitourinary: Negative.   Musculoskeletal: Negative.   Neurological: Negative.  Negative for headaches.  Hematological: Negative.   Psychiatric/Behavioral: Negative.   All other systems reviewed and are negative.      Objective:   Physical Exam  Constitutional: She is oriented to person, place, and time. She appears well-developed and well-nourished. No distress.  Cardiovascular: Normal rate, regular rhythm, normal heart sounds and intact distal pulses.   No murmur heard. Pulmonary/Chest: Effort normal and breath sounds normal. No respiratory distress. She has no wheezes.  Abdominal: Soft. Bowel sounds are normal. She exhibits no distension. There is no tenderness.  Musculoskeletal: Normal range of motion. She exhibits no edema or tenderness.  Neurological: She is alert and oriented to person, place, and time. She has normal reflexes. No cranial nerve deficit.  Skin: Skin is warm and dry. There is erythema.  Right leg wound- 2-3 inch width blister present on right lower inner leg, with erythemas around leg.  Area marked. Mild swelling  Psychiatric: She has a normal mood and affect. Her behavior is normal. Judgment and thought content normal.  Vitals reviewed.    BP 207/82 mmHg  Pulse 63  Temp(Src) 97.6 F (36.4 C) (Oral)  Ht 5\' 2"  (1.575 m)  Wt 136 lb 9.6 oz (61.961 kg)  BMI 24.98 kg/m2      Assessment & Plan:  1. Atrial fibrillation, chronic - POCT INR  2. Wound of right leg, subsequent encounter -Keep close eye on redness- Pt told to keep the area marked -Keep leg elevated -Stay off leg as much as possible -Make sure pt finished antibiotic -RTO if leg becomes worse and make appt to be seen 8 days (antibiotic should be finished)  Evelina Dun, FNP

## 2015-01-23 NOTE — Telephone Encounter (Signed)
Appointment given for 3/23 with Susan Fitzgerald for a recheck.

## 2015-01-26 ENCOUNTER — Encounter: Payer: Self-pay | Admitting: Family

## 2015-01-26 ENCOUNTER — Ambulatory Visit (INDEPENDENT_AMBULATORY_CARE_PROVIDER_SITE_OTHER): Payer: Medicare Other | Admitting: Family

## 2015-01-26 VITALS — BP 160/64 | HR 62 | Temp 97.2°F | Ht 62.0 in | Wt 137.8 lb

## 2015-01-26 DIAGNOSIS — L03115 Cellulitis of right lower limb: Secondary | ICD-10-CM

## 2015-01-26 DIAGNOSIS — I482 Chronic atrial fibrillation, unspecified: Secondary | ICD-10-CM

## 2015-01-26 LAB — POCT INR: INR: 2.6

## 2015-01-26 MED ORDER — CEPHALEXIN 750 MG PO CAPS: 750.0000 mg | ORAL_CAPSULE | Freq: Four times a day (QID) | ORAL | Status: DC

## 2015-01-26 NOTE — Patient Instructions (Signed)

## 2015-01-26 NOTE — Progress Notes (Signed)
   Subjective:    Patient ID: Susan Fitzgerald, female    DOB: 1932/09/15, 81 y.o.   MRN: [id number]  HPI Pt presents to the office today to recheck wound on lower right  inner leg. PT was seen in the office and given 10 days of Keflex and the area was marked and pt was told to return if the redness grew. Pt is on Day 5 of the kelfex, and states the redness is worse today then it had been. Pt states her pain is a 6 or 7 out 10. States it is a intermittent burning pain.    Review of Systems  Constitutional: Negative.   HENT: Negative.   Eyes: Negative.   Respiratory: Negative.  Negative for shortness of breath.   Cardiovascular: Negative.  Negative for palpitations.  Gastrointestinal: Negative.   Endocrine: Negative.   Genitourinary: Negative.   Musculoskeletal: Negative.   Neurological: Negative.  Negative for headaches.  Hematological: Negative.   Psychiatric/Behavioral: Negative.   All other systems reviewed and are negative.      Objective:   Physical Exam  Constitutional: She is oriented to person, place, and time. She appears well-developed and well-nourished. No distress.  Eyes: Pupils are equal, round, and reactive to light.  Neck: Normal range of motion. Neck supple. No thyromegaly present.  Cardiovascular: Normal rate, regular rhythm, normal heart sounds and intact distal pulses.   No murmur heard. Pulmonary/Chest: Effort normal and breath sounds normal. No respiratory distress. She has no wheezes.  Abdominal: Soft. Bowel sounds are normal. She exhibits no distension. There is no tenderness.  Musculoskeletal: Normal range of motion. She exhibits tenderness. She exhibits no edema.  Neurological: She is alert and oriented to person, place, and time. She has normal reflexes. No cranial nerve deficit.  Skin: Skin is warm and dry. There is erythema.  Large Blister (3.5cm X 3.5 cm)on right lower inner leg with erythemas surrounding leg that distends to ankle. Pt states she has  discoloration normally to leg, but "not this bad".   Psychiatric: She has a normal mood and affect. Her behavior is normal. Judgment and thought content normal.  Vitals reviewed.   BP 160/64 mmHg  Pulse 62  Temp(Src) 97.2 F (36.2 C) (Oral)  Ht 5\' 2"  (1.575 m)  Wt 137 lb 12.8 oz (62.506 kg)  BMI 25.20 kg/m2       Assessment & Plan:  1. Cellulitis of right lower extremity -Keep elevated -Continue keflex -S/S infection discussed - AMB referral to wound care center-Tuesday at 11:30  Evelina Dun, FNP

## 2015-01-27 ENCOUNTER — Other Ambulatory Visit: Payer: Self-pay | Admitting: Family Medicine

## 2015-01-31 DIAGNOSIS — Z809 Family history of malignant neoplasm, unspecified: Secondary | ICD-10-CM | POA: Diagnosis not present

## 2015-01-31 DIAGNOSIS — E780 Pure hypercholesterolemia: Secondary | ICD-10-CM | POA: Diagnosis not present

## 2015-01-31 DIAGNOSIS — Z79899 Other long term (current) drug therapy: Secondary | ICD-10-CM | POA: Diagnosis not present

## 2015-01-31 DIAGNOSIS — I10 Essential (primary) hypertension: Secondary | ICD-10-CM | POA: Diagnosis not present

## 2015-01-31 DIAGNOSIS — Z8249 Family history of ischemic heart disease and other diseases of the circulatory system: Secondary | ICD-10-CM | POA: Diagnosis not present

## 2015-01-31 DIAGNOSIS — I482 Chronic atrial fibrillation: Secondary | ICD-10-CM | POA: Diagnosis not present

## 2015-01-31 DIAGNOSIS — Z888 Allergy status to other drugs, medicaments and biological substances status: Secondary | ICD-10-CM | POA: Diagnosis not present

## 2015-01-31 DIAGNOSIS — S8011XA Contusion of right lower leg, initial encounter: Secondary | ICD-10-CM | POA: Diagnosis not present

## 2015-02-07 DIAGNOSIS — Z79899 Other long term (current) drug therapy: Secondary | ICD-10-CM | POA: Diagnosis not present

## 2015-02-07 DIAGNOSIS — Z809 Family history of malignant neoplasm, unspecified: Secondary | ICD-10-CM | POA: Diagnosis not present

## 2015-02-07 DIAGNOSIS — S8011XA Contusion of right lower leg, initial encounter: Secondary | ICD-10-CM | POA: Diagnosis not present

## 2015-02-07 DIAGNOSIS — E780 Pure hypercholesterolemia: Secondary | ICD-10-CM | POA: Diagnosis not present

## 2015-02-07 DIAGNOSIS — I10 Essential (primary) hypertension: Secondary | ICD-10-CM | POA: Diagnosis not present

## 2015-02-07 DIAGNOSIS — Z888 Allergy status to other drugs, medicaments and biological substances status: Secondary | ICD-10-CM | POA: Diagnosis not present

## 2015-02-07 DIAGNOSIS — Z8249 Family history of ischemic heart disease and other diseases of the circulatory system: Secondary | ICD-10-CM | POA: Diagnosis not present

## 2015-02-07 DIAGNOSIS — I482 Chronic atrial fibrillation: Secondary | ICD-10-CM | POA: Diagnosis not present

## 2015-02-08 ENCOUNTER — Ambulatory Visit: Payer: Medicare Other | Admitting: Family

## 2015-02-14 DIAGNOSIS — L570 Actinic keratosis: Secondary | ICD-10-CM | POA: Diagnosis not present

## 2015-02-14 DIAGNOSIS — L814 Other melanin hyperpigmentation: Secondary | ICD-10-CM | POA: Diagnosis not present

## 2015-02-14 DIAGNOSIS — D225 Melanocytic nevi of trunk: Secondary | ICD-10-CM | POA: Diagnosis not present

## 2015-02-14 DIAGNOSIS — L820 Inflamed seborrheic keratosis: Secondary | ICD-10-CM | POA: Diagnosis not present

## 2015-02-14 DIAGNOSIS — D1801 Hemangioma of skin and subcutaneous tissue: Secondary | ICD-10-CM | POA: Diagnosis not present

## 2015-02-14 DIAGNOSIS — L821 Other seborrheic keratosis: Secondary | ICD-10-CM | POA: Diagnosis not present

## 2015-02-15 ENCOUNTER — Encounter: Payer: Self-pay | Admitting: Cardiology

## 2015-02-15 ENCOUNTER — Ambulatory Visit (INDEPENDENT_AMBULATORY_CARE_PROVIDER_SITE_OTHER): Payer: Medicare Other | Admitting: Cardiology

## 2015-02-15 VITALS — BP 120/70 | HR 62 | Ht 62.0 in | Wt 137.0 lb

## 2015-02-15 DIAGNOSIS — E780 Pure hypercholesterolemia: Secondary | ICD-10-CM | POA: Diagnosis not present

## 2015-02-15 DIAGNOSIS — Z8249 Family history of ischemic heart disease and other diseases of the circulatory system: Secondary | ICD-10-CM | POA: Diagnosis not present

## 2015-02-15 DIAGNOSIS — I482 Chronic atrial fibrillation, unspecified: Secondary | ICD-10-CM

## 2015-02-15 DIAGNOSIS — S8011XA Contusion of right lower leg, initial encounter: Secondary | ICD-10-CM | POA: Diagnosis not present

## 2015-02-15 DIAGNOSIS — Z79899 Other long term (current) drug therapy: Secondary | ICD-10-CM | POA: Diagnosis not present

## 2015-02-15 DIAGNOSIS — Z888 Allergy status to other drugs, medicaments and biological substances status: Secondary | ICD-10-CM | POA: Diagnosis not present

## 2015-02-15 DIAGNOSIS — I10 Essential (primary) hypertension: Secondary | ICD-10-CM | POA: Diagnosis not present

## 2015-02-15 DIAGNOSIS — Z809 Family history of malignant neoplasm, unspecified: Secondary | ICD-10-CM | POA: Diagnosis not present

## 2015-02-15 NOTE — Progress Notes (Signed)
HPI The patient she presents for evaluation of atrial fibrillation. Since I last saw her she has done well. She denies any cardiovascular symptoms. She has had no chest pressure, neck or arm discomfort. She has had reare palpitations and no presyncope or syncope.  She denies any chest pressure, neck or arm discomfort. She's had no weight gain or edema. She still golfing.  Allergies  Allergen Reactions  . Benazepril Hcl Cough    Current Outpatient Prescriptions  Medication Sig Dispense Refill  . amLODipine (NORVASC) 5 MG tablet TAKE ONE-HALF TO ONE TABLET BY MOUTH AT BEDTIME 90 tablet 1  . calcium-vitamin D (OSCAL WITH D) 250-125 MG-UNIT per tablet Take 1 tablet by mouth 2 (two) times daily.    . flecainide (TAMBOCOR) 150 MG tablet Take 0.5 tablets (75 mg total) by mouth 2 (two) times daily. 90 tablet 1  . furosemide (LASIX) 20 MG tablet Take 1 tablet (20 mg total) by mouth daily. 30 tablet 3  . lisinopril (PRINIVIL,ZESTRIL) 40 MG tablet TAKE ONE TABLET BY MOUTH ONCE DAILY 90 tablet 1  . Multiple Vitamin (MULTIVITAMIN) capsule Take 1 capsule by mouth daily.    . pindolol (VISKEN) 5 MG tablet Take 1 tablet (5 mg total) by mouth 2 (two) times daily. 180 tablet 1  . potassium chloride SA (K-DUR,KLOR-CON) 20 MEQ tablet Take 1 tablet (20 mEq total) by mouth daily. 90 tablet 1  . warfarin (COUMADIN) 5 MG tablet Take 1 tablet by mouth daily or as directed. 90 tablet 0   No current facility-administered medications for this visit.    Past Medical History  Diagnosis Date  . Menopause   . A-fib     04/09/13-nuclear stress-no ischemia low risk  . Hypertension   . Osteoporosis   . Diverticulosis   . Cataract   . Atrial fibrillation   . Bradycardia 2014  . Pelvic fracture     pelvic belt    Past Surgical History  Procedure Laterality Date  . Cataract extraction    . Biopsy breast      ROS:  .As stated in the HPI and negative for all other systems.  PHYSICAL EXAM BP 120/70 mmHg   Pulse 62  Ht 5\' 2"  (1.575 m)  Wt 137 lb (62.143 kg)  BMI 25.05 kg/m2 GENERAL:  Well appearing HEENT:  Pupils equal round and reactive, fundi not visualized, oral mucosa unremarkable NECK:  No jugular venous distention, waveform within normal limits, carotid upstroke brisk and symmetric, no bruits, no thyromegaly LYMPHATICS:  No cervical, inguinal adenopathy LUNGS:  Clear to auscultation bilaterally BACK:  No CVA tenderness CHEST:  Unremarkable HEART:  PMI not displaced or sustained,S1 and S2 within normal limits, no S3, no S4, no clicks, no rubs, no murmurs ABD:  Flat, positive bowel sounds normal in frequency in pitch, positive midline bruits, no rebound, no guarding, no midline pulsatile mass, no hepatomegaly, no splenomegaly EXT:  2 plus pulses throughout, no edema, no cyanosis no clubbing, chronic venous stasis changes SKIN:  No rashes no nodules, healing superficial right leg ulcer   EKG:  Sinus rhythm, rate 62, left axis deviation, left anterior fascicular block, early transition in lead V2, no acute ST-T wave changes.  02/15/2015  ASSESSMENT AND PLAN  HTN:  The blood pressure is at target. No change in medications is indicated. We will continue with therapeutic lifestyle changes (TLC).  ATRIAL FIB:  Ms. GALYLEA VEGA has a CHA2DS2 - VASc score of 4 with a risk of  stroke of 4%  .  She will check with her insurance company to see if one of the NOACs is affordable. For now she will continue on warfarin. She will continue on the meds as listed.

## 2015-02-15 NOTE — Patient Instructions (Signed)
The current medical regimen is effective;  continue present plan and medications.  Xarelto/Eliquis  Follow up in 1 year with Dr. Percival Spanish in [place].  You will receive a letter in the mail 2 months before you are due.  Please call us when you receive this letter to schedule your follow up appointment.  Thank you for choosing [facility]!!

## 2015-02-18 ENCOUNTER — Other Ambulatory Visit: Payer: Self-pay | Admitting: Family Medicine

## 2015-02-26 ENCOUNTER — Emergency Department (HOSPITAL_COMMUNITY): Payer: Medicare Other

## 2015-02-26 ENCOUNTER — Encounter (HOSPITAL_COMMUNITY): Payer: Self-pay | Admitting: Emergency Medicine

## 2015-02-26 ENCOUNTER — Emergency Department (HOSPITAL_COMMUNITY)
Admission: EM | Admit: 2015-02-26 | Discharge: 2015-02-26 | Disposition: A | Discharge status: Home / Self Care | Payer: Medicare Other | Attending: Emergency Medicine | Admitting: Emergency Medicine

## 2015-02-26 DIAGNOSIS — I517 Cardiomegaly: Secondary | ICD-10-CM | POA: Diagnosis not present

## 2015-02-26 DIAGNOSIS — H269 Unspecified cataract: Secondary | ICD-10-CM | POA: Insufficient documentation

## 2015-02-26 DIAGNOSIS — Z8781 Personal history of (healed) traumatic fracture: Secondary | ICD-10-CM | POA: Diagnosis not present

## 2015-02-26 DIAGNOSIS — M810 Age-related osteoporosis without current pathological fracture: Secondary | ICD-10-CM | POA: Insufficient documentation

## 2015-02-26 DIAGNOSIS — R002 Palpitations: Secondary | ICD-10-CM | POA: Diagnosis present

## 2015-02-26 DIAGNOSIS — Z8719 Personal history of other diseases of the digestive system: Secondary | ICD-10-CM | POA: Insufficient documentation

## 2015-02-26 DIAGNOSIS — Z7901 Long term (current) use of anticoagulants: Secondary | ICD-10-CM | POA: Diagnosis not present

## 2015-02-26 DIAGNOSIS — I10 Essential (primary) hypertension: Secondary | ICD-10-CM | POA: Diagnosis not present

## 2015-02-26 DIAGNOSIS — I4891 Unspecified atrial fibrillation: Secondary | ICD-10-CM | POA: Diagnosis not present

## 2015-02-26 DIAGNOSIS — Z79899 Other long term (current) drug therapy: Secondary | ICD-10-CM | POA: Diagnosis not present

## 2015-02-26 DIAGNOSIS — I499 Cardiac arrhythmia, unspecified: Secondary | ICD-10-CM | POA: Diagnosis not present

## 2015-02-26 LAB — CBC WITH DIFFERENTIAL/PLATELET
BASOS PCT: 1 % (ref 0–1)
Basophils Absolute: 0.1 10*3/uL (ref 0.0–0.1)
EOS ABS: 0.3 10*3/uL (ref 0.0–0.7)
Eosinophils Relative: 3 % (ref 0–5)
HCT: 42.7 % (ref 36.0–46.0)
HEMOGLOBIN: 14 g/dL (ref 12.0–15.0)
LYMPHS ABS: 2.1 10*3/uL (ref 0.7–4.0)
LYMPHS PCT: 25 % (ref 12–46)
MCH: 31.4 pg (ref 26.0–34.0)
MCHC: 32.8 g/dL (ref 30.0–36.0)
MCV: 95.7 fL (ref 78.0–100.0)
MONO ABS: 0.7 10*3/uL (ref 0.1–1.0)
Monocytes Relative: 8 % (ref 3–12)
Neutro Abs: 5.2 10*3/uL (ref 1.7–7.7)
Neutrophils Relative %: 63 % (ref 43–77)
Platelets: 176 10*3/uL (ref 150–400)
RBC: 4.46 MIL/uL (ref 3.87–5.11)
RDW: 13.7 % (ref 11.5–15.5)
WBC: 8.3 10*3/uL (ref 4.0–10.5)

## 2015-02-26 LAB — PROTIME-INR
INR: 2.02 — AB (ref 0.00–1.49)
Prothrombin Time: 23 seconds — ABNORMAL HIGH (ref 11.6–15.2)

## 2015-02-26 LAB — BASIC METABOLIC PANEL
ANION GAP: 10 (ref 5–15)
BUN: 23 mg/dL (ref 6–23)
CALCIUM: 9.6 mg/dL (ref 8.4–10.5)
CO2: 28 mmol/L (ref 19–32)
CREATININE: 1.1 mg/dL (ref 0.50–1.10)
Chloride: 102 mmol/L (ref 96–112)
GFR calc non Af Amer: 46 mL/min — ABNORMAL LOW (ref >90)
GFR, EST AFRICAN AMERICAN: 53 mL/min — AB (ref >90)
GLUCOSE: 115 mg/dL — AB (ref 70–99)
Potassium: 4.2 mmol/L (ref 3.5–5.1)
SODIUM: 140 mmol/L (ref 135–145)

## 2015-02-26 LAB — TROPONIN I: Troponin I: <0.03 ng/mL (ref <0.031)

## 2015-02-26 MED ORDER — DILTIAZEM LOAD VIA INFUSION
15.0000 mg | Freq: Once | INTRAVENOUS | Status: AC
  Administered 2015-02-26: 15 mg via INTRAVENOUS
  Filled 2015-02-26: qty 15

## 2015-02-26 MED ORDER — ASPIRIN 81 MG PO CHEW
324.0000 mg | CHEWABLE_TABLET | Freq: Once | ORAL | Status: AC
  Administered 2015-02-26: 324 mg via ORAL
  Filled 2015-02-26: qty 4

## 2015-02-26 MED ORDER — DILTIAZEM HCL 100 MG IV SOLR
5.0000 mg/h | INTRAVENOUS | Status: DC
  Administered 2015-02-26: 15 mg/h via INTRAVENOUS
  Filled 2015-02-26: qty 100

## 2015-02-26 NOTE — Discharge Instructions (Signed)
Anticoagulation, Generic  Anticoagulants are medicines used to prevent clots from developing in your veins. These medicine are also known as blood thinners. If blood clots are untreated, they could travel to your lungs. This is called a pulmonary embolus. A blood clot in your lungs can be fatal.   Health care providers often use anticoagulants to prevent clots following surgery. Anticoagulants are also used along with aspirin when the heart is not getting enough blood.  Another anticoagulant called warfarin is started 2 to 3 days after a rapid-acting injectable anticoagulant is started. The rapid-acting anticoagulants are usually continued until warfarin has begun to work. Your health care provider will judge this length of time by blood tests known as the prothrombin time (PT) and International Normalization Ratio (INR). This means that your blood is at the necessary and best level to prevent clots.  RISKS AND COMPLICATIONS  · If you have received recent epidural anesthesia, spinal anesthesia, or a spinal tap while receiving anticoagulants, you are at risk for developing a blood clot in or around the spine. This condition could result in long-term or permanent paralysis.  · Because anticoagulants thin your blood, severe bleeding may occur from any tissue or organ. Symptoms of the blood being too thin may include:  ¨ Bleeding from the nose or gums that does not stop quickly.  ¨ Blood in bowel movements which may appear as bright red, dark, or black tarry stools.  ¨ Blood in the urine which may appear as pink, red, or brown urine.  ¨ Unusual bruising or bruising easily.  ¨ A cut that does not stop bleeding within 10 minutes.  ¨ Vomiting blood or continuous nausea for more than 1 day.  ¨ Coughing up blood.  ¨ Broken blood vessels in your eye (subconjunctival hemorrhage).  ¨ Abdominal or back pain with or without flank bruising.  ¨ Sudden, severe headache.  ¨ Sudden weakness or numbness of the face, arm, or leg,  especially on one side of the body.  ¨ Sudden confusion.  ¨ Trouble speaking (aphasia) or understanding.  ¨ Sudden trouble seeing in one or both eyes.  ¨ Sudden trouble walking.  ¨ Dizziness.  ¨ Loss of balance or coordination.  ¨ Vaginal bleeding.  ¨ Swelling or pain at an injection site.  ¨ Superficial fat tissue death (necrosis) which may cause skin scarring. This is more common in women and may first present as pain in the waist, thighs, or buttocks.  ¨ Fever.  · Too little anticoagulation continues to allow the risk for blood clots.  HOME CARE INSTRUCTIONS   · Due to the complications of anticoagulants, it is very important that you take your anticoagulant as directed by your health care provider. Anticoagulants need to be taken exactly as instructed. Be sure you understand all your anticoagulant instructions.  · Keep all follow-up appointments with your health care provider as directed. It is very important to keep your appointments. Not keeping appointments could result in a chronic or permanent injury, pain, or disability.  · Warfarin. Your health care provider will advise you on the length of treatment (usually 3-6 months, sometimes lifelong).  ¨ Take warfarin exactly as directed by your health care provider. It is recommended that you take your warfarin dose at the same time of the day. It is preferred that you take warfarin in the late afternoon. If you have been told to stop taking warfarin, do not resume taking warfarin until directed to do so by your health care   provider. Follow your health care provider's instructions if you accidentally take an extra dose or miss a dose of warfarin. It is very important to take warfarin as directed since bleeding or blood clots could result in chronic or permanent injury, pain, or disability.  ¨ Too much and too little warfarin are both dangerous. Too much warfarin increases the risk of bleeding. Too little warfarin continues to allow the risk for blood clots. While  taking warfarin, you will need to have regular blood tests to measure your blood clotting time. These blood tests usually include both the prothrombin time (PT) and International Normalized Ratio (INR) tests. The PT and INR results allow your health care provider to adjust your dose of warfarin. The dose can change for many reasons. It is critically important that you have your PT and INR levels drawn exactly as directed. Your warfarin dose may stay the same or change depending on what the PT and INR results are. Be sure to follow up with your health care provider regarding your PT and INR test results and what your warfarin dosage should be.  ¨ Many medicines can interfere with warfarin and affect the PT and INR results. You must tell your health care provider about any and all medicines you take, this includes all vitamins and supplements. Ask your health care provider before taking these. Prescription and over-the-counter medicine consistency is critical to warfarin management. It is important that potential interactions are checked before you start a new medicine. Be especially cautious with aspirin and anti-inflammatory medicines. Ask your health care provider before taking these. Medicines such as antibiotics and acid-reducing medicine can interact with warfarin and can cause an increased warfarin effect. Warfarin can also interfere with the effectiveness of medicines you are taking. Do not take or discontinue any prescribed or over-the-counter medicine except on the advice of your health care provider or pharmacist.  ¨ Some vitamins, supplements, and herbal products interfere with the effectiveness of warfarin. Vitamin E may increase the anticoagulant effects of warfarin. Vitamin K may can cause warfarin to be less effective. Do not take or discontinue any vitamin, supplement, or herbal product except on the advice of your health care provider or pharmacist.  ¨ Eat what you normally eat and keep the vitamin K  content of your diet consistent. Avoid major changes in your diet, or notify your health care provider before changing your diet. Suddenly getting a lot more vitamin K could cause your blood to clot too quickly. A sudden decrease in vitamin K intake could cause your blood to clot too slowly. These changes in vitamin K intake could lead to dangerous blood clots or to bleeding. To keep your vitamin K intake consistent, you must be aware of which foods contain moderate or high amounts of vitamin K. Some foods high in vitamin K include spinach, kale, broccoli, cabbage, greens, Brussels sprouts, asparagus, B[place]oy, coleslaw, parsley, and green tea. Arrange a visit with a dietitian to answer your questions.  ¨ If you have a loss of appetite or get the stomach flu (viral gastroenteritis), talk to your health care provider as soon as possible. A decrease in your normal vitamin K intake can make you more sensitive to your usual dose of warfarin.  ¨ Some medical conditions may increase your risk for bleeding while you are taking warfarin. A fever, diarrhea lasting more than a day, worsening heart failure, or worsening liver function are some medical conditions that could affect warfarin. Contact your health care provider if   you have any of these medical conditions.  ¨ Alcohol can change the body's ability to handle warfarin. It is best to avoid alcoholic drinks or consume only very small amounts while taking warfarin. Notify your health care provider if you change your alcohol intake. A sudden increase in alcohol use can increase your risk of bleeding. Chronic alcohol use can cause warfarin to be less effective.  · Be careful not to cut yourself when using sharp objects or while shaving.  · Inform all your health care providers and your dentist that you take an anticoagulant.  · Limit physical activities or sports that could result in a fall or cause injury. Avoid contact sports.  · Wear medical alert jewelry or carry a  medical alert card.  SEEK IMMEDIATE MEDICAL CARE IF:  · You cough up blood.  · You have dark or black stools or there is bright red blood coming from your rectum.  · You vomit blood or have nausea for more than 1 day.  · You have blood in the urine or pink colored urine.  · You have unusual bruising or have increased bruising.  · You have bleeding from the nose or gums that does not stop quickly.  · You have a cut that does not stop bleeding within a 2-3 minutes.  · You have sudden weakness or numbness of the face, arm, or leg, especially on one side of the body.  · You have sudden confusion.  · You have trouble speaking (aphasia) or understanding.  · You have sudden trouble seeing in one or both eyes.  · You have sudden trouble walking.  · You have dizziness.  · You have a loss of balance or coordination.  · You have a sudden, severe headache.  · You have a serious fall or head injury, even if you are not bleeding.  · You have swelling or pain at an injection site.  · You have unexplained tenderness or pain in the abdomen, back, waist, thighs or buttocks.  · You have a fever.  Any of these symptoms may represent a serious problem that is an emergency. Do not wait to see if the symptoms will go away. Get medical help right away. Call your local emergency services (911 in [place] Do not drive yourself to the hospital.  Document Released: 11/04/2005 Document Revised: 11/09/2013 Document Reviewed: 06/08/2008  [employer]® Patient Information ©2015 [employer]. This information is not intended to replace advice given to you by your health care provider. Make sure you discuss any questions you have with your health care provider.

## 2015-02-26 NOTE — ED Notes (Signed)
Pt NSR on monitor, states that she is feeling better, pt and family updated,

## 2015-02-26 NOTE — ED Notes (Signed)
Dr. Regenia Skeeter at bedside,

## 2015-02-26 NOTE — ED Provider Notes (Signed)
CSN: [id number]     Arrival date & time 02/26/15  1300 History   First MD Initiated Contact with Patient 02/26/15 1310     Chief Complaint  Patient presents with  . Palpitations     (Consider location/radiation/quality/duration/timing/severity/associated sxs/prior Treatment) HPI  81 year old female presents with an acute onset of palpitations, dizziness, and right shoulder pain. She has a long-standing history of atrial fibrillation that is usually well controlled with flecainide and pindolol. Patient has not missed any doses recently and took her morning doses. She was sitting in church and started to feel flushed and lightheaded. She has felt palpitations. Denies chest pain but occasionally has shortness of breath with inspiration as well as this right shoulder pain. Has not been ill recently.   Past Medical History  Diagnosis Date  . Menopause   . A-fib     04/09/13-nuclear stress-no ischemia low risk  . Hypertension   . Osteoporosis   . Diverticulosis   . Cataract   . Atrial fibrillation   . Bradycardia 2014  . Pelvic fracture     pelvic belt   Past Surgical History  Procedure Laterality Date  . Cataract extraction    . Biopsy breast     Family History  Problem Relation Age of Onset  . Stroke Mother     cerebral hemorrhage  . Heart disease Father     MI  . Heart attack Father   . Vision loss Father   . Heart disease Brother   . Heart attack Brother   . Cancer Maternal Aunt     breast  . Cancer Brother   . Heart disease Brother   . Cancer Daughter     breast   History  Substance Use Topics  . Smoking status: Never Smoker   . Smokeless tobacco: Never Used  . Alcohol Use: No   OB History    Gravida Para Term Preterm AB TAB SAB Ectopic Multiple Living   2 2 2       2      Review of Systems  Constitutional: Negative for fever.  Respiratory: Positive for shortness of breath. Negative for cough.   Cardiovascular: Negative for chest pain.  Gastrointestinal:  Negative for vomiting and abdominal pain.  Genitourinary: Negative for dysuria.  Neurological: Positive for dizziness.  All other systems reviewed and are negative.     Allergies  Benazepril hcl  Home Medications   Prior to Admission medications   Medication Sig Start Date End Date Taking? Authorizing Provider  amLODipine (NORVASC) 5 MG tablet TAKE ONE-HALF TO ONE TABLET BY MOUTH AT BEDTIME   Yes Chipper Herb, MD  calcium-vitamin D (OSCAL WITH D) 250-125 MG-UNIT per tablet Take 1 tablet by mouth 2 (two) times daily.   Yes Historical Provider, MD  flecainide (TAMBOCOR) 150 MG tablet Take 0.5 tablets (75 mg total) by mouth 2 (two) times daily. 09/05/14  Yes Chipper Herb, MD  furosemide (LASIX) 20 MG tablet Take 1 tablet (20 mg total) by mouth daily. 09/29/14  Yes Chipper Herb, MD  lisinopril (PRINIVIL,ZESTRIL) 40 MG tablet TAKE ONE TABLET BY MOUTH ONCE DAILY 01/27/15  Yes Sharion Balloon, FNP  Multiple Vitamin (MULTIVITAMIN) capsule Take 1 capsule by mouth daily.   Yes Historical Provider, MD  pindolol (VISKEN) 5 MG tablet Take 1 tablet (5 mg total) by mouth 2 (two) times daily. 12/29/14  Yes Chipper Herb, MD  potassium chloride SA (K-DUR,KLOR-CON) 20 MEQ tablet Take 1 tablet (20 mEq  total) by mouth daily. 11/24/14  Yes Tammy Eckard, PHARMD  warfarin (COUMADIN) 5 MG tablet TAKE ONE TABLET BY MOUTH ONCE DAILY OR AS DIRECTED 02/20/15  Yes Christy A Hawks, FNP   BP 138/90 mmHg  Pulse 133  Temp(Src) 97.7 F (36.5 C) (Oral)  Resp 18  Ht 5\' 2"  (1.575 m)  Wt 132 lb (59.875 kg)  BMI 24.14 kg/m2  SpO2 100% Physical Exam  Constitutional: She is oriented to person, place, and time. She appears well-developed and well-nourished. No distress.  HENT:  Head: Normocephalic and atraumatic.  Right Ear: External ear normal.  Left Ear: External ear normal.  Nose: Nose normal.  Eyes: Right eye exhibits no discharge. Left eye exhibits no discharge.  Cardiovascular: Normal heart sounds and intact  distal pulses.  An irregularly irregular rhythm present. Tachycardia present.   Pulmonary/Chest: Effort normal and breath sounds normal.  Abdominal: Soft. There is no tenderness.  Musculoskeletal: She exhibits no edema.       Right shoulder: She exhibits normal range of motion and no tenderness.  Neurological: She is alert and oriented to person, place, and time.  Skin: Skin is warm and dry. She is not diaphoretic.  Nursing note and vitals reviewed.   ED Course  Procedures (including critical care time) Labs Review Labs Reviewed  BASIC METABOLIC PANEL - Abnormal; Notable for the following:    Glucose, Bld 115 (*)    GFR calc non Af Amer 46 (*)    GFR calc Af Amer 53 (*)    All other components within normal limits  PROTIME-INR - Abnormal; Notable for the following:    Prothrombin Time 23.0 (*)    INR 2.02 (*)    All other components within normal limits  CBC WITH DIFFERENTIAL/PLATELET  TROPONIN I    Imaging Review Dg Chest Portable 1 View  02/26/2015   CLINICAL DATA:  Arrhythmia, history atrial fibrillation, associated lightheadedness, pain and pulling in RIGHT shoulder, shortness of breath  EXAM: PORTABLE CHEST - 1 VIEW  COMPARISON:  Portable exam 1330 hours. No prior exams available for comparison at this time.  FINDINGS: Minimal enlargement of cardiac silhouette.  Mild tortuosity of thoracic aorta.  Mediastinal contours and pulmonary vascularity normal.  Lungs clear.  No pleural effusion or pneumothorax.  Bones demineralized.  IMPRESSION: Minimal enlargement of cardiac silhouette.  No acute abnormalities.   Electronically Signed   By: Lavonia Dana M.D.   On: 02/26/2015 14:55     EKG Interpretation   Date/Time:  Sunday February 26 2015 13:08:15 EDT Ventricular Rate:  123 PR Interval:    QRS Duration: 115 QT Interval:  357 QTC Calculation: 511 R Axis:   -56 Text Interpretation:  Atrial fibrillation LVH with IVCD, LAD and secondary  repol abnrm Minimal ST elevation, lateral  leads Prolonged QT interval  Baseline wander in lead(s) V5 afib new compared to 2014 and with increased  rate Confirmed by Dagen Beevers  MD, [place] ([id number]) on 02/26/2015 1:10:18 PM      EKG #2  EKG Interpretation  Date/Time:  Sunday February 26 2015 14:24:36 EDT Ventricular Rate:  63 PR Interval:  171 QRS Duration: 105 QT Interval:  428 QTC Calculation: 438 R Axis:   -15 Text Interpretation:  Sinus rhythm Abnormal R-wave progression, early transition Left ventricular hypertrophy Afib has converted to sinus Confirmed by Kila Godina  MD, [place] ([id number]) on 02/26/2015 2:34:20 PM       MDM   Final diagnoses:  Atrial fibrillation with RVR  Patient's atrial fibrillation controlled with one dose of IV diltiazem. Heart rate normalized and then the patient converted to normal sinus rhythm. She no longer feels lightheaded and is not having shoulder pain. Patient's heart rate now in the 60s, which is normal for the patient per her. Has remained in the 63s with no further treatment or drip. Given her shoulder pain that did not appear musculoskeletal during the symptoms, I discussed her case with the cardiologist on call, Dr. Meda Coffee. She reviewed the patient and at this point feels one troponin is adequate and that likely the patient will need an outpatient stress test. Her office will call patient tomorrow to around close outpatient follow-up. Discussed plan and results with patient and husband, tibial, trouble going home and will return to the ER if any symptoms return or new symptoms develop. They understand return precautions.    Sherwood Gambler, MD 02/26/15 [phone]

## 2015-02-26 NOTE — ED Notes (Signed)
Per patient sitting in church "when she felt her heart go out of rhythm" (hx of afib) also reports feeling lightheaded at the time. Patient now reports pain/pull in right shoulder and some shortness of breath that started when she got home. Patient seen cardiologist this week for checkup and had EKG that was normal. Denies any nausea or vomiting.

## 2015-02-26 NOTE — ED Notes (Signed)
Pt assisted to restroom, tolerated well,  

## 2015-02-26 NOTE — ED Notes (Signed)
Pt states that she was at church when she started feeling like her heart was beating fast, has hx of a-fib, pt also felt light-headed, pain between shoulder blades, pt states that she was seen by her cardiologist last week and was "in rhythm"  Denies any n/v, does admit to "some sob",  Pt a=fib on monitor upon arrival to tx room,

## 2015-02-28 ENCOUNTER — Telehealth: Payer: Self-pay | Admitting: Cardiology

## 2015-02-28 ENCOUNTER — Other Ambulatory Visit: Payer: Self-pay | Admitting: Family Medicine

## 2015-02-28 NOTE — Telephone Encounter (Signed)
Pt had a big atrial fib episode on Sunday and went to [facility]. She said she was suppose to have heard from somebody here yesterday.If not,she was told to call and talk to somebody today. If not there,please [phone].

## 2015-02-28 NOTE — Telephone Encounter (Signed)
Patient reports on Sunday she had an episode of AF where her HR got up to around 150bpm and had some pain in top & back of right shoulder, which was new for her, which is what prompted the ED visit. She was in church when this started and she felt a little faint & dizzy. She went to [facility] and was given IV diltiazem which normalized HR and converted her to SR and she was also given 4 baby aspirin. She reports she can tell when her heart rate speeds up. She reports when her HR speeds up, it is short-lived. She wanted to know if there was a PRN medication she could take when her HR increases - she DOES NOT want to add another daily medication to her regimen. She reports she feels fine now and played 18 holes of golf yesterday. Of note, it was also recommended by EDP Dr. Regenia Skeeter for patient to have stress test.   Will defer to Dr. Percival Spanish & Jeannene Patella, RN to review and advise patient.

## 2015-02-28 NOTE — Telephone Encounter (Signed)
Discussed with patient who states understanding of Dr Hochrein's orders.  She will call back with any further questions, concerns or needs.

## 2015-02-28 NOTE — Telephone Encounter (Signed)
She could take a half of her Pindolol when that happens to see if that helps.

## 2015-03-02 ENCOUNTER — Telehealth: Payer: Self-pay | Admitting: Family Medicine

## 2015-03-02 MED ORDER — FUROSEMIDE 20 MG PO TABS: ORAL_TABLET | ORAL | Status: DC

## 2015-03-02 MED ORDER — FLECAINIDE ACETATE 150 MG PO TABS: 75.0000 mg | ORAL_TABLET | Freq: Two times a day (BID) | ORAL | Status: DC

## 2015-03-02 NOTE — Telephone Encounter (Signed)
Patient received a 30 day supply instead of a 90 day supply. 90 day supply of flecainide and lasix sent to pharmacy electronically.

## 2015-03-03 ENCOUNTER — Telehealth: Payer: Self-pay | Admitting: Cardiology

## 2015-03-03 NOTE — Telephone Encounter (Signed)
Per pt - received a telephone requesting she call back to schedule a follow up appointment after her ED experience however she does not feel she needs to come in for an appointment.  She will follow the advise given by Dr Percival Spanish and call back if any concerns, questions or issues.

## 2015-03-03 NOTE — Telephone Encounter (Signed)
New message     Pt is returning a nurses call.  She see Dr Percival Spanish in [place]

## 2015-03-06 ENCOUNTER — Ambulatory Visit (INDEPENDENT_AMBULATORY_CARE_PROVIDER_SITE_OTHER): Payer: Medicare Other | Admitting: Pharmacist

## 2015-03-06 DIAGNOSIS — I482 Chronic atrial fibrillation, unspecified: Secondary | ICD-10-CM

## 2015-03-06 LAB — POCT INR: INR: 2.1

## 2015-03-06 NOTE — Patient Instructions (Signed)
Anticoagulation Dose Instructions as of 03/06/2015      Susan Fitzgerald Tue Wed Thu Fri Sat   New Dose 5 mg 5 mg 5 mg 5 mg 5 mg 5 mg 5 mg    Description        Continue warfarin 5mg  1 tablet daily.       INR was 2.1

## 2015-03-10 ENCOUNTER — Telehealth: Payer: Self-pay | Admitting: Cardiology

## 2015-03-10 NOTE — Telephone Encounter (Signed)
New message     Call patient to set up an after hour voice mail appt following her discharge from hospital.     Patient stated she discuss with Pam Dr. Percival Spanish nurse. She is doing well does not need an appt at this time.

## 2015-03-20 MED FILL — Diltiazem HCl IV For Soln 100 MG: INTRAVENOUS | Qty: 100 | Status: AC

## 2015-04-10 ENCOUNTER — Ambulatory Visit (INDEPENDENT_AMBULATORY_CARE_PROVIDER_SITE_OTHER): Payer: Medicare Other | Admitting: Pharmacist

## 2015-04-10 ENCOUNTER — Encounter: Payer: Self-pay | Admitting: Pharmacist

## 2015-04-10 VITALS — BP 148/70 | HR 66 | Ht 62.0 in | Wt 139.0 lb

## 2015-04-10 DIAGNOSIS — I482 Chronic atrial fibrillation, unspecified: Secondary | ICD-10-CM

## 2015-04-10 DIAGNOSIS — Z0000 Encounter for general adult medical examination without abnormal findings: Secondary | ICD-10-CM

## 2015-04-10 LAB — POCT INR: INR: 2.2

## 2015-04-10 MED ORDER — AMLODIPINE BESYLATE 5 MG PO TABS: 5.0000 mg | ORAL_TABLET | Freq: Every day | ORAL | Status: DC

## 2015-04-10 MED ORDER — LISINOPRIL 40 MG PO TABS: 40.0000 mg | ORAL_TABLET | Freq: Every day | ORAL | Status: DC

## 2015-04-10 MED ORDER — WARFARIN SODIUM 5 MG PO TABS: ORAL_TABLET | ORAL | Status: DC

## 2015-04-10 NOTE — Progress Notes (Signed)
Patient ID: BASIL CARTRIGHT, female   DOB: Sep 30, 1932, 81 y.o.   MRN: [id number]    Subjective:   Susan Fitzgerald is a 81 y.o. female who presents for a subsequent Medicare Annual Wellness Visit and to check protime   Current Medications (verified) Outpatient Encounter Prescriptions as of 04/10/2015  Medication Sig  . amLODipine (NORVASC) 5 MG tablet Take 1 tablet (5 mg total) by mouth daily.  . calcium-vitamin D (OSCAL WITH D) 250-125 MG-UNIT per tablet Take 1 tablet by mouth 2 (two) times daily.  . flecainide (TAMBOCOR) 150 MG tablet Take 0.5 tablets (75 mg total) by mouth 2 (two) times daily.  . furosemide (LASIX) 20 MG tablet 1 to 2 tablets daily PRN  . lisinopril (PRINIVIL,ZESTRIL) 40 MG tablet Take 1 tablet (40 mg total) by mouth daily.  . Multiple Vitamin (MULTIVITAMIN) capsule Take 1 capsule by mouth daily.  . pindolol (VISKEN) 5 MG tablet Take 1 tablet (5 mg total) by mouth 2 (two) times daily.  . potassium chloride SA (K-DUR,KLOR-CON) 20 MEQ tablet Take 1 tablet (20 mEq total) by mouth daily.  Marland Kitchen warfarin (COUMADIN) 5 MG tablet TAKE ONE TABLET BY MOUTH ONCE DAILY OR AS DIRECTED  . [DISCONTINUED] amLODipine (NORVASC) 5 MG tablet TAKE ONE-HALF TO ONE TABLET BY MOUTH AT BEDTIME  . [DISCONTINUED] lisinopril (PRINIVIL,ZESTRIL) 40 MG tablet TAKE ONE TABLET BY MOUTH ONCE DAILY  . [DISCONTINUED] warfarin (COUMADIN) 5 MG tablet TAKE ONE TABLET BY MOUTH ONCE DAILY OR AS DIRECTED   No facility-administered encounter medications on file as of 04/10/2015.    Allergies (verified) Benazepril hcl   History: Past Medical History  Diagnosis Date  . Menopause   . A-fib     04/09/13-nuclear stress-no ischemia low risk  . Hypertension   . Osteoporosis   . Diverticulosis   . Cataract   . Atrial fibrillation   . Bradycardia 2014  . Pelvic fracture     pelvic belt   Past Surgical History  Procedure Laterality Date  . Cataract extraction    . Biopsy breast     Family History  Problem  Relation Age of Onset  . Stroke Mother     cerebral hemorrhage  . Heart disease Father     MI  . Heart attack Father   . Vision loss Father   . Heart disease Brother   . Heart attack Brother   . Cancer Maternal Aunt     breast  . Cancer Brother   . Heart disease Brother   . Cancer Daughter     breast   Social History   Occupational History  . Not on file.   Social History Main Topics  . Smoking status: Never Smoker   . Smokeless tobacco: Never Used  . Alcohol Use: No  . Drug Use: No  . Sexual Activity: Yes   Do you feel safe at home?  Yes  Dietary issues and exercise activities: Current Exercise Habits:: Home exercise routine, Type of exercise: Other - see comments (golfs 2 to 3 times per week), Time (Minutes): > 60, Frequency (Times/Week): 3, Weekly Exercise (Minutes/Week): 0, Intensity: Mild  Current Dietary habits:  Eats lots of vegetables and fruits.  Tries to maintain current weight.   Objective:    Today's Vitals   04/10/15 1533  BP: 148/70  Pulse: 66  Height: 5\' 2"  (1.575 m)  Weight: 139 lb (63.05 kg)   Body mass index is 25.42 kg/(m^2).   INR was 2.2 today  Activities  of Daily Living In your present state of health, do you have any difficulty performing the following activities: 04/10/2015  Hearing? N  Vision? N  Difficulty concentrating or making decisions? N  Walking or climbing stairs? N  Dressing or bathing? N  Doing errands, shopping? N  Preparing Food and eating ? N  Using the Toilet? N  In the past six months, have you accidently leaked urine? N  Do you have problems with loss of bowel control? N  Managing your Medications? N  Managing your Finances? N  Housekeeping or managing your Housekeeping? N    Are there smokers in your home (other than you)? No   Cardiac Risk Factors include: advanced age (>48men, >41 women);hypertension  Depression Screen PHQ 2/9 Scores 04/10/2015 08/08/2014 04/20/2014  PHQ - 2 Score 0 0 0    Fall Risk Fall  Risk  04/10/2015 08/08/2014 04/20/2014  Falls in the past year? No No No  Risk for fall due to : - - Medication side effect  Risk for fall due to (comments): - - occassional dizziness with standing    Cognitive Function: MMSE - Mini Mental State Exam 04/10/2015  Orientation to time 5  Orientation to Place 5  Registration 3  Attention/ Calculation 5  Recall 3  Language- name 2 objects 2  Language- repeat 1  Language- follow 3 step command 3  Language- read & follow direction 1  Write a sentence 1  Copy design 1  Total score 30    Immunizations and Health Maintenance Immunization History  Administered Date(s) Administered  . Influenza,inj,Quad PF,36+ Mos 08/23/2013, 09/05/2014  . Pneumococcal Conjugate-13 09/05/2014   There are no preventive care reminders to display for this patient.  Patient Care Team: Chipper Herb, MD as PCP - General (Family Medicine) Minus Breeding, MD as Consulting Physician (Cardiology) Druscilla Brownie, MD as Consulting Physician (Dermatology)  Indicate any recent Medical Services you may have received from other than [facility] providers in the past year (date may be approximate).    Assessment:    Annual Wellness Visit  Therapeutic anticoagulation   Screening Tests Health Maintenance  Topic Date Due  . INFLUENZA VACCINE  06/19/2015  . PNA vac Low Risk Adult (2 of 2 - PPSV23) 09/06/2015  . MAMMOGRAM  10/18/2015  . DEXA SCAN  04/20/2016  . COLONOSCOPY  09/18/2020  . TETANUS/TDAP  08/18/2021  . ZOSTAVAX  Completed        Plan:   During the course of the visit Susan Fitzgerald was educated and counseled about the following appropriate screening and preventive services:   Vaccines to include Pneumoccal, Influenza, Hepatitis B, Td, Zostavax - UTD  Colorectal cancer screening - UTD  Cardiovascular disease screening - see cardiologist regularly.  BP good today.  LDL at goal  Diabetes screening - UTD, last FBG was 78  Bone Denisty / Osteoporosis  Screening - UTD  Mammogram - due after 10/18/15 - appt set up today in office for 12/27/216   Glaucoma screening / Diabetic Eye Exam - UTD  Nutrition counseling - continue with current diet.  Continue reguar exercise  Rx sent in for lisinopril, warfarin and amlodipine for patient - sent to [facility]  Anticoagulation Dose Instructions as of 04/10/2015      Susan Fitzgerald Tue Wed Thu Fri Sat   New Dose 5 mg 5 mg 5 mg 5 mg 5 mg 5 mg 5 mg    Description        Continue warfarin 5mg  1 tablet  daily.         Patient Instructions (the written plan) were given to the patient.   Cherre Robins, PHARMD , CDE, CPP  04/10/2015

## 2015-04-10 NOTE — Patient Instructions (Signed)
  Susan Fitzgerald , Thank you for taking time to come for your Medicare Wellness Visit. I appreciate your ongoing commitment to your health goals. Please review the following plan we discussed and let me know if I can assist you in the future.    This is a list of the screening recommended for you and due dates:  Health Maintenance  Topic Date Due  . Flu Shot  06/19/2015  . Pneumonia vaccines (2 of 2 - PPSV23) 09/06/2015  . Mammogram  10/18/2015 - appointment made today for 11/13/2105 at 9am  . DEXA scan (bone density measurement)  04/20/2016  . Colon Cancer Screening  09/18/2020  . Tetanus Vaccine  08/18/2021  . Shingles Vaccine  Completed    Anticoagulation Dose Instructions as of 04/10/2015      Dorene Grebe Tue Wed Thu Fri Sat   New Dose 5 mg 5 mg 5 mg 5 mg 5 mg 5 mg 5 mg    Description        Continue warfarin 5mg  1 tablet daily.      INR was 2.2 today

## 2015-05-08 ENCOUNTER — Ambulatory Visit: Payer: Medicare Other | Admitting: Family Medicine

## 2015-05-19 ENCOUNTER — Telehealth: Payer: Self-pay | Admitting: Pharmacist

## 2015-05-19 MED ORDER — WARFARIN SODIUM 5 MG PO TABS: ORAL_TABLET | ORAL | Status: DC

## 2015-05-19 MED ORDER — POTASSIUM CHLORIDE CRYS ER 20 MEQ PO TBCR: 20.0000 meq | EXTENDED_RELEASE_TABLET | Freq: Every day | ORAL | Status: DC

## 2015-05-19 NOTE — Telephone Encounter (Signed)
Rx sent electronically to [facility] and patient notified.

## 2015-05-29 ENCOUNTER — Ambulatory Visit (INDEPENDENT_AMBULATORY_CARE_PROVIDER_SITE_OTHER): Payer: Medicare Other | Admitting: Pharmacist

## 2015-05-29 DIAGNOSIS — I482 Chronic atrial fibrillation, unspecified: Secondary | ICD-10-CM

## 2015-05-29 LAB — POCT INR: INR: 2.2

## 2015-05-29 NOTE — Patient Instructions (Signed)
Anticoagulation Dose Instructions as of 05/29/2015      Dorene Grebe Tue Wed Thu Fri Sat   New Dose 5 mg 5 mg 5 mg 5 mg 5 mg 5 mg 5 mg    Description        Continue current warfarin dose of 5mg  1 tablet daily.      INR was 2.2 today

## 2015-06-07 ENCOUNTER — Ambulatory Visit (INDEPENDENT_AMBULATORY_CARE_PROVIDER_SITE_OTHER): Payer: Medicare Other | Admitting: Physician Assistant

## 2015-06-07 ENCOUNTER — Encounter: Payer: Self-pay | Admitting: Physician Assistant

## 2015-06-07 VITALS — BP 216/82 | HR 65 | Temp 96.9°F | Ht 62.0 in | Wt 137.0 lb

## 2015-06-07 DIAGNOSIS — S51812A Laceration without foreign body of left forearm, initial encounter: Secondary | ICD-10-CM

## 2015-06-07 MED ORDER — CEPHALEXIN 500 MG PO CAPS: 500.0000 mg | ORAL_CAPSULE | Freq: Four times a day (QID) | ORAL | Status: DC

## 2015-06-07 NOTE — Progress Notes (Signed)
   Subjective:    Patient ID: VEVERLY TRENTACOSTE, female    DOB: May 12, 1932, 81 y.o.   MRN: [id number]  HPI 81 y/o female presents for laceration on left arm that occurred 2 days ago s/p hitting a chest of drawers. She has thin skin and states that this happens occasionally. She has tried neosporin and bandaged with no improvement    Review of Systems  Unable to perform ROS Skin:       Laceration on left arm near elbow with bruising. ttp but not painful without touch. Inflamed.   All other systems reviewed and are negative.      Objective:   Physical Exam  Constitutional: She is oriented to person, place, and time. She appears well-developed and well-nourished. No distress.  Musculoskeletal: She exhibits no edema.  Neurological: She is alert and oriented to person, place, and time.  Skin: She is not diaphoretic. There is erythema.  Erosion on left arm, approximately 3-4 cm in length, surrounding erythema, ttp   Psychiatric: She has a normal mood and affect. Her behavior is normal. Judgment and thought content normal.  Nursing note and vitals reviewed.         Assessment & Plan:  1. Laceration of left forearm, initial encounter - Tegaderm applied to lesion - cephALEXin (KEFLEX) 500 MG capsule; Take 1 capsule (500 mg total) by mouth 4 (four) times daily.  Dispense: 28 capsule; Refill: 0   F/u in 1 week for reassessment   Nikitha Mode A. Benjamin Stain PA-C

## 2015-06-15 ENCOUNTER — Ambulatory Visit (INDEPENDENT_AMBULATORY_CARE_PROVIDER_SITE_OTHER): Payer: Medicare Other | Admitting: Physician Assistant

## 2015-06-15 ENCOUNTER — Encounter: Payer: Self-pay | Admitting: Physician Assistant

## 2015-06-15 VITALS — BP 159/78 | HR 61 | Temp 98.3°F | Ht 62.0 in | Wt 137.2 lb

## 2015-06-15 DIAGNOSIS — S51812D Laceration without foreign body of left forearm, subsequent encounter: Secondary | ICD-10-CM

## 2015-06-15 NOTE — Progress Notes (Signed)
   Subjective:    Patient ID: Susan Fitzgerald, female    DOB: 1932-01-26, 81 y.o.   MRN: [id number]  HPI 81 y/o female presents for follow up of laceration on left arm. Tegaderm was placed on arm at visit one week ago but she had to rtc the next day because the dressing came off. At that time, steristrips were applied to laceration.     Review of Systems  Constitutional: Negative.   HENT: Negative.   Respiratory: Negative.   Gastrointestinal: Negative.   Skin:       Healing laceration on left forearm, negative for pain, swelling       Objective:   Physical Exam  Skin:  Healing laceration on left forearm. Steristrips in place, C/D/I. Appears to be healing well with no erythema or indications of infection          Assessment & Plan:  1. Laceration of left forearm, subsequent encounter - Leave steristrips in place and cut edges as they begin to peel off. May remove after 1 week. Apply vitamin E oil for scar prevention    RTO prn   Helen Cuff A. Benjamin Stain PA-C

## 2015-06-15 NOTE — Patient Instructions (Signed)
Hydrocortisone , benadryl or zyrtec for itch   Over the counter scar away after bandage is removed.   Steristrips need to stay on a total of 2 weeks

## 2015-06-26 ENCOUNTER — Other Ambulatory Visit: Payer: Self-pay | Admitting: Family Medicine

## 2015-07-17 ENCOUNTER — Ambulatory Visit (INDEPENDENT_AMBULATORY_CARE_PROVIDER_SITE_OTHER): Payer: Medicare Other | Admitting: Pharmacist

## 2015-07-17 ENCOUNTER — Encounter: Payer: Self-pay | Admitting: Family Medicine

## 2015-07-17 ENCOUNTER — Ambulatory Visit (INDEPENDENT_AMBULATORY_CARE_PROVIDER_SITE_OTHER): Payer: Medicare Other

## 2015-07-17 ENCOUNTER — Ambulatory Visit (INDEPENDENT_AMBULATORY_CARE_PROVIDER_SITE_OTHER): Payer: Medicare Other | Admitting: Family Medicine

## 2015-07-17 ENCOUNTER — Other Ambulatory Visit: Payer: Self-pay | Admitting: Pharmacist

## 2015-07-17 VITALS — BP 138/67 | HR 62 | Temp 97.8°F | Ht 62.0 in | Wt 134.0 lb

## 2015-07-17 DIAGNOSIS — I482 Chronic atrial fibrillation, unspecified: Secondary | ICD-10-CM

## 2015-07-17 DIAGNOSIS — M25551 Pain in right hip: Secondary | ICD-10-CM

## 2015-07-17 DIAGNOSIS — M4726 Other spondylosis with radiculopathy, lumbar region: Secondary | ICD-10-CM

## 2015-07-17 LAB — POCT INR: INR: 2.8

## 2015-07-17 NOTE — Progress Notes (Signed)
Subjective:    Patient ID: Susan Fitzgerald, female    DOB: [birth year], 81 y.o.   MRN: [id number]  HPI Patient here today for right hip pain that started about 2-3 weeks ago. There has been no injury. Susan Fitzgerald does recall however that Susan Fitzgerald was recently playing golf on a much more hilly terrain. Susan Fitzgerald also says that Susan Fitzgerald sit down appearance of the plot down and jarred Susan Fitzgerald back and spine more than usual. The discomfort is worse after sitting for long period time and being in a stable position for a period of time. The pain is in the right anterior thigh area and some in the low back.      Patient Active Problem List   Diagnosis Date Noted  . Symptomatic bradycardia 06/02/2013  . Hypertension 04/21/2013  . Osteoporosis 04/21/2013  . Atrial fibrillation, chronic 02/09/2013   Outpatient Encounter Prescriptions as of 07/17/2015  Medication Sig  . amLODipine (NORVASC) 5 MG tablet Take 1 tablet (5 mg total) by mouth daily.  . calcium-vitamin D (OSCAL WITH D) 250-125 MG-UNIT per tablet Take 1 tablet by mouth 2 (two) times daily.  . flecainide (TAMBOCOR) 150 MG tablet Take 0.5 tablets (75 mg total) by mouth 2 (two) times daily.  . furosemide (LASIX) 20 MG tablet 1 to 2 tablets daily PRN  . lisinopril (PRINIVIL,ZESTRIL) 40 MG tablet Take 1 tablet (40 mg total) by mouth daily.  . Multiple Vitamin (MULTIVITAMIN) capsule Take 1 capsule by mouth daily.  . pindolol (VISKEN) 5 MG tablet TAKE ONE TABLET BY MOUTH TWICE DAILY  . potassium chloride SA (K-DUR,KLOR-CON) 20 MEQ tablet Take 1 tablet (20 mEq total) by mouth daily.  Marland Kitchen warfarin (COUMADIN) 5 MG tablet TAKE ONE TABLET BY MOUTH ONCE DAILY OR AS DIRECTED   No facility-administered encounter medications on file as of 07/17/2015.      Review of Systems  Constitutional: Negative.   HENT: Negative.   Eyes: Negative.   Respiratory: Negative.   Cardiovascular: Negative.   Gastrointestinal: Negative.   Endocrine: Negative.   Genitourinary: Negative.     Musculoskeletal: Positive for arthralgias (right hip pain).  Skin: Negative.   Allergic/Immunologic: Negative.   Neurological: Negative.   Hematological: Negative.   Psychiatric/Behavioral: Negative.    [facility] reading (PRIMARY) by  Dr. Laurance Flatten- right hip-- degenerative changes                                      Objective:   Physical Exam  Constitutional: Susan Fitzgerald is oriented to person, place, and time. Susan Fitzgerald appears well-developed and well-nourished. No distress.  HENT:  Head: Normocephalic and atraumatic.  Eyes: Conjunctivae and EOM are normal. Pupils are equal, round, and reactive to light. Right eye exhibits no discharge. Left eye exhibits no discharge. No scleral icterus.  Neck: Normal range of motion.  Musculoskeletal: Normal range of motion. Susan Fitzgerald exhibits no edema or tenderness.  Leg raising on the right aggravates pain in the back and right groin area and right anterior thigh  Neurological: Susan Fitzgerald is alert and oriented to person, place, and time. Susan Fitzgerald has normal reflexes.  Skin: No rash noted.  Psychiatric: Susan Fitzgerald has a normal mood and affect. Susan Fitzgerald behavior is normal. Judgment and thought content normal.  Nursing note and vitals reviewed.  BP 138/67 mmHg  Pulse 62  Temp(Src) 97.8 F (36.6 C) (Oral)  Ht 5\' 2"  (1.575 m)  Wt 134 lb (60.782 kg)  BMI 24.50 kg/m2        Assessment & Plan:  1. Osteoarthritis of spine with radiculopathy, lumbar region -Take tylenol for pain and use warm wet compresses  Patient Instructions  Use extra strength tylenol Use good range of motion   use warm wet compresses 2 right hip and low back  Give this more time.   Arrie Senate MD

## 2015-07-17 NOTE — Patient Instructions (Signed)
Anticoagulation Dose Instructions as of 07/17/2015      Susan Fitzgerald Tue Wed Thu Fri Sat   New Dose 5 mg 5 mg 5 mg 5 mg 5 mg 5 mg 5 mg    Description        Continue current warfarin dose of 5mg  1 tablet daily.      INR was 2.8 today

## 2015-07-17 NOTE — Patient Instructions (Addendum)
Use extra strength tylenol Use good range of motion   use warm wet compresses 2 right hip and low back  Give this more time.

## 2015-07-17 NOTE — Progress Notes (Signed)
Patient triaged to Dr Laurance Flatten for evaluation of right hip pain

## 2015-08-16 ENCOUNTER — Ambulatory Visit (INDEPENDENT_AMBULATORY_CARE_PROVIDER_SITE_OTHER): Payer: Medicare Other | Admitting: Physician Assistant

## 2015-08-16 ENCOUNTER — Telehealth: Payer: Self-pay | Admitting: Physician Assistant

## 2015-08-16 ENCOUNTER — Encounter: Payer: Self-pay | Admitting: Physician Assistant

## 2015-08-16 ENCOUNTER — Telehealth: Payer: Self-pay | Admitting: Cardiology

## 2015-08-16 VITALS — BP 141/93 | Temp 96.9°F | Ht 62.0 in | Wt 133.0 lb

## 2015-08-16 DIAGNOSIS — I482 Chronic atrial fibrillation, unspecified: Secondary | ICD-10-CM

## 2015-08-16 DIAGNOSIS — R000 Tachycardia, unspecified: Secondary | ICD-10-CM | POA: Diagnosis not present

## 2015-08-16 NOTE — Patient Instructions (Signed)
Call Dr. Rosezella Florida office to reschedule stress test and further evaluation   I have also advised her to drink water to prevent dehydration and go to ED is shoulder pain worsens or CP occurs. I have also advised her to follow up with Dr. Percival Spanish to determine if she shold take her BP medications.

## 2015-08-16 NOTE — Telephone Encounter (Signed)
Pt is returning Mary's call

## 2015-08-16 NOTE — Telephone Encounter (Signed)
Returned patient call.  States that her heart rate usually runs 55-65, but the past 3-4 days has been up to 113.  Has been irregulatr  BP this morning is 134/86 HR 104,  But BP has been as low as 88/63  Has had some pain in right shoulder.  No pain chest, abdomen or shortness of breath  Saw PMD this morning.  Did not do an EKG.  She recommended she see call her cardiologist about her heart rate  Will route to Dr. Percival Spanish

## 2015-08-16 NOTE — Progress Notes (Signed)
   Subjective:    Patient ID: Susan Fitzgerald, female    DOB: 1931-11-22, 81 y.o.   MRN: [id number]  HPI 81 y/o female presents with increased BP and HR x 4 days. She has had similar symptoms in the past. Was seen at [facility] for similar symptoms and was told to follow up with Cardiology for stress test. She was called by Dr. Rosezella Florida office and told them that she did not need the test because she was feeling better. Today's episode is very similar to the episode she had when she presented to the ED. She sees Dr. Percival Spanish for her afib. She has not took her Norvasc or Tambocor x 2 days.     Review of Systems  Constitutional: Negative.   Eyes: Negative.  Negative for visual disturbance.  Respiratory: Negative.   Cardiovascular: Negative for chest pain, palpitations and leg swelling.  Gastrointestinal: Negative.   Musculoskeletal: Negative.        Right shoulder pain   Neurological: Positive for dizziness, light-headedness and headaches.       Objective:   Physical Exam  Constitutional: She is oriented to person, place, and time. She appears well-developed and well-nourished. No distress.  Cardiovascular: Exam reveals no gallop and no friction rub.   No murmur heard. Irregularly irregular   Pulmonary/Chest: Effort normal and breath sounds normal. No respiratory distress. She has no wheezes. She has no rales. She exhibits no tenderness.  Musculoskeletal: She exhibits tenderness (ttp right posterior shoulder).  Neurological: She is alert and oriented to person, place, and time.  Skin: She is not diaphoretic.  Psychiatric: She has a normal mood and affect. Her behavior is normal. Judgment and thought content normal.  Nursing note and vitals reviewed.         Assessment & Plan:  1. Chronic atrial fibrillation Continue medications as prescribed   2. Increased heart rate - follow up with Dr. Cherlyn Cushing office to schedule stress test as she was advised to do after her ED appt in  April 2016.  - Avoid caffeine - Report to ED if CP, numbness/tingling in arm, jaw pain or SOB occur  3. Right shoulder pain - this is most likely d/t patient playing golf vs cardiac related. I have advised her to alternate heat and cold packs. I do not feel comfortable prescribing NSAID d/t patients cardiac symptoms and need for further evaluation.    Follow up with cardiology after leaving the office to schedule appt with Cardiologist.   Tiffany A. Benjamin Stain PA-C

## 2015-08-16 NOTE — Telephone Encounter (Signed)
Pt called in and left a message stating that she feels that she needs a stress test(myocardial perfusion) because her Bp has been flucuating, high HR and pain in her rt shoulder. Please f/u with pt  Thanks

## 2015-08-16 NOTE — Telephone Encounter (Signed)
LMTCB

## 2015-08-17 ENCOUNTER — Telehealth: Payer: Self-pay

## 2015-08-17 NOTE — Telephone Encounter (Signed)
I don't think she needs a stress test but I would be happy to see her back in the clinic to discuss.

## 2015-08-17 NOTE — Telephone Encounter (Signed)
Called patient this morning for F/U  Patient said that she feels normal this morning.  On way to play golf  Heart rate is down to 76, BP is 135/86 no further discomfort in right shoulder  She feels there is a correlation to her elevated heart rate and pain in right shoulder  Will call if symptoms return

## 2015-08-17 NOTE — Telephone Encounter (Signed)
Leave message for pt to call back 

## 2015-08-18 NOTE — Telephone Encounter (Signed)
Spoke with pt she will like to see you before March of next year, message send to billie to make appt.

## 2015-08-20 ENCOUNTER — Other Ambulatory Visit: Payer: Self-pay | Admitting: Family Medicine

## 2015-08-21 ENCOUNTER — Telehealth: Payer: Self-pay | Admitting: Family Medicine

## 2015-08-21 DIAGNOSIS — M79601 Pain in right arm: Principal | ICD-10-CM

## 2015-08-21 DIAGNOSIS — G8929 Other chronic pain: Secondary | ICD-10-CM

## 2015-08-21 DIAGNOSIS — M79604 Pain in right leg: Principal | ICD-10-CM

## 2015-08-21 NOTE — Telephone Encounter (Signed)
Please make appointment with orthopedic surgeon at [place] [facility] either Dr. Gladstone Lighter for Dr. Maureen Ralphs as soon as possible and if they think an MRI as needed they can do this in their office--- please call patient and let her know what we're going to do

## 2015-08-21 NOTE — Telephone Encounter (Signed)
Referral for ortho put in, stp and she is aware of referral.

## 2015-08-21 NOTE — Telephone Encounter (Signed)
Pt seen couple weeks back for right leg/hip pain, which is not any better. States not functioning well with activities, she can walk but other activities of using stairs is not easy, just not sure when this will give way and possible make her fall. What is the next step? Should an MRI be done? But she is very claustrophobic and she is very anxious because she has a family vacation in a couple of weeks.

## 2015-08-22 NOTE — Telephone Encounter (Signed)
Can this encounter be closed?

## 2015-08-28 ENCOUNTER — Ambulatory Visit (INDEPENDENT_AMBULATORY_CARE_PROVIDER_SITE_OTHER): Payer: Medicare Other | Admitting: Pharmacist

## 2015-08-28 DIAGNOSIS — Z23 Encounter for immunization: Secondary | ICD-10-CM

## 2015-08-28 DIAGNOSIS — I482 Chronic atrial fibrillation, unspecified: Secondary | ICD-10-CM

## 2015-08-28 DIAGNOSIS — I10 Essential (primary) hypertension: Secondary | ICD-10-CM

## 2015-08-28 DIAGNOSIS — M25551 Pain in right hip: Secondary | ICD-10-CM | POA: Diagnosis not present

## 2015-08-28 LAB — POCT INR: INR: 3.3

## 2015-08-28 NOTE — Patient Instructions (Addendum)
Anticoagulation Dose Instructions as of 08/28/2015      Susan Fitzgerald Tue Wed Thu Fri Sat   New Dose 5 mg 5 mg 5 mg 5 mg 5 mg 5 mg 5 mg    Description        No warfarin today - Monday, October 10th.  The continue current warfarin dose of 5mg  1 tablet daily.      INR was 3.3 today   Continue to monitor blood pressure at home.  If you are consistently getting systolic blood pressure reading over 140 - call office for medication adjustment.  Goal is less than 140 on top and less than 90 on bottom.

## 2015-08-28 NOTE — Telephone Encounter (Signed)
Can this encounter be closed?

## 2015-08-28 NOTE — Telephone Encounter (Signed)
Did not see initial call  Stanton Kidney had sent to Dr. Percival Spanish. Called patient to follow up on this, see what happened.  She reports symptoms resolved w/in 2-3 days, reports no probs now - HR ~60-65, BP within normal range. Advised to call if problems return - pt amenable to this plan.

## 2015-08-29 DIAGNOSIS — M25551 Pain in right hip: Secondary | ICD-10-CM | POA: Diagnosis not present

## 2015-08-29 LAB — CMP14+EGFR
A/G RATIO: 1.2 (ref 1.1–2.5)
ALT: 27 IU/L (ref 0–32)
AST: 29 IU/L (ref 0–40)
Albumin: 3.7 g/dL (ref 3.5–4.7)
Alkaline Phosphatase: 86 IU/L (ref 39–117)
BILIRUBIN TOTAL: 0.4 mg/dL (ref 0.0–1.2)
BUN/Creatinine Ratio: 23 (ref 11–26)
BUN: 20 mg/dL (ref 8–27)
CO2: 29 mmol/L (ref 18–29)
Calcium: 8.6 mg/dL — ABNORMAL LOW (ref 8.7–10.3)
Chloride: 101 mmol/L (ref 97–108)
Creatinine, Ser: 0.86 mg/dL (ref 0.57–1.00)
GFR calc non Af Amer: 64 mL/min/{1.73_m2} (ref >59)
GFR, EST AFRICAN AMERICAN: 73 mL/min/{1.73_m2} (ref >59)
Globulin, Total: 3 g/dL (ref 1.5–4.5)
Glucose: 83 mg/dL (ref 65–99)
POTASSIUM: 4.2 mmol/L (ref 3.5–5.2)
SODIUM: 142 mmol/L (ref 134–144)
Total Protein: 6.7 g/dL (ref 6.0–8.5)

## 2015-08-29 LAB — LIPID PANEL
CHOL/HDL RATIO: 2.5 ratio (ref 0.0–4.4)
Cholesterol, Total: 168 mg/dL (ref 100–199)
HDL: 66 mg/dL (ref >39)
LDL Calculated: 90 mg/dL (ref 0–99)
TRIGLYCERIDES: 60 mg/dL (ref 0–149)
VLDL Cholesterol Cal: 12 mg/dL (ref 5–40)

## 2015-09-01 DIAGNOSIS — M87051 Idiopathic aseptic necrosis of right femur: Secondary | ICD-10-CM | POA: Diagnosis not present

## 2015-09-06 ENCOUNTER — Ambulatory Visit: Payer: Medicare Other | Admitting: Cardiology

## 2015-09-15 ENCOUNTER — Ambulatory Visit: Payer: Medicare Other | Admitting: Cardiology

## 2015-09-20 ENCOUNTER — Ambulatory Visit (INDEPENDENT_AMBULATORY_CARE_PROVIDER_SITE_OTHER): Payer: Medicare Other | Admitting: Pharmacist

## 2015-09-20 ENCOUNTER — Ambulatory Visit: Payer: Medicare Other | Admitting: Cardiology

## 2015-09-20 DIAGNOSIS — I482 Chronic atrial fibrillation, unspecified: Secondary | ICD-10-CM

## 2015-09-20 LAB — POCT INR: INR: 3.1

## 2015-09-20 MED ORDER — PINDOLOL 5 MG PO TABS: 5.0000 mg | ORAL_TABLET | Freq: Two times a day (BID) | ORAL | Status: DC

## 2015-09-20 NOTE — Patient Instructions (Signed)
Anticoagulation Dose Instructions as of 09/20/2015      Dorene Grebe Tue Wed Thu Fri Sat   New Dose 5 mg 5 mg 5 mg 5 mg 5 mg 5 mg 5 mg    Description        No warfarin today - Wednesday, November 2nd.  The continue current warfarin dose of 5mg  1 tablet daily.

## 2015-10-18 ENCOUNTER — Other Ambulatory Visit: Payer: Self-pay

## 2015-10-18 MED ORDER — LISINOPRIL 40 MG PO TABS: 40.0000 mg | ORAL_TABLET | Freq: Every day | ORAL | Status: DC

## 2015-10-18 MED ORDER — WARFARIN SODIUM 5 MG PO TABS: ORAL_TABLET | ORAL | Status: DC

## 2015-10-18 MED ORDER — FLECAINIDE ACETATE 150 MG PO TABS: 75.0000 mg | ORAL_TABLET | Freq: Two times a day (BID) | ORAL | Status: DC

## 2015-10-18 MED ORDER — FUROSEMIDE 20 MG PO TABS: ORAL_TABLET | ORAL | Status: DC

## 2015-10-18 MED ORDER — PINDOLOL 5 MG PO TABS: 5.0000 mg | ORAL_TABLET | Freq: Two times a day (BID) | ORAL | Status: DC

## 2015-10-18 MED ORDER — AMLODIPINE BESYLATE 5 MG PO TABS: 5.0000 mg | ORAL_TABLET | Freq: Every day | ORAL | Status: DC

## 2015-10-18 MED ORDER — POTASSIUM CHLORIDE CRYS ER 20 MEQ PO TBCR: 20.0000 meq | EXTENDED_RELEASE_TABLET | Freq: Every day | ORAL | Status: DC

## 2015-10-23 ENCOUNTER — Ambulatory Visit (INDEPENDENT_AMBULATORY_CARE_PROVIDER_SITE_OTHER): Payer: Medicare Other | Admitting: Pharmacist

## 2015-10-23 VITALS — BP 142/70 | HR 72

## 2015-10-23 DIAGNOSIS — I482 Chronic atrial fibrillation, unspecified: Secondary | ICD-10-CM

## 2015-10-23 LAB — POCT INR: INR: 2.1

## 2015-10-23 NOTE — Progress Notes (Signed)
Also made mammogram appointment for Tuesday, January 31st at 10am

## 2015-10-23 NOTE — Patient Instructions (Signed)
Anticoagulation Dose Instructions as of 10/23/2015      Susan Fitzgerald Tue Wed Thu Fri Sat   New Dose 5 mg 5 mg 5 mg 5 mg 5 mg 5 mg 5 mg    Description        Continue current warfarin dose of 5mg  1 tablet daily.      INR was 2.1 today

## 2015-11-14 DIAGNOSIS — M87051 Idiopathic aseptic necrosis of right femur: Secondary | ICD-10-CM | POA: Diagnosis not present

## 2015-11-14 DIAGNOSIS — M25551 Pain in right hip: Secondary | ICD-10-CM | POA: Diagnosis not present

## 2015-11-17 DIAGNOSIS — Z1231 Encounter for screening mammogram for malignant neoplasm of breast: Secondary | ICD-10-CM | POA: Diagnosis not present

## 2015-11-17 LAB — HM MAMMOGRAPHY

## 2015-11-19 DIAGNOSIS — M069 Rheumatoid arthritis, unspecified: Secondary | ICD-10-CM

## 2015-11-19 HISTORY — DX: Rheumatoid arthritis, unspecified: M06.9

## 2015-11-22 ENCOUNTER — Other Ambulatory Visit: Payer: Self-pay | Admitting: Family Medicine

## 2015-11-23 ENCOUNTER — Encounter: Payer: Self-pay | Admitting: *Deleted

## 2015-11-23 ENCOUNTER — Other Ambulatory Visit: Payer: Self-pay | Admitting: *Deleted

## 2015-11-23 MED ORDER — PINDOLOL 5 MG PO TABS: 5.0000 mg | ORAL_TABLET | Freq: Two times a day (BID) | ORAL | Status: DC

## 2015-11-27 ENCOUNTER — Ambulatory Visit (INDEPENDENT_AMBULATORY_CARE_PROVIDER_SITE_OTHER): Payer: Medicare Other | Admitting: Pharmacist

## 2015-11-27 DIAGNOSIS — I482 Chronic atrial fibrillation, unspecified: Secondary | ICD-10-CM

## 2015-11-27 LAB — POCT INR: INR: 2.5

## 2015-11-27 NOTE — Patient Instructions (Signed)
Anticoagulation Dose Instructions as of 11/27/2015      Dorene Grebe Tue Wed Thu Fri Sat   New Dose 5 mg 5 mg 5 mg 5 mg 5 mg 5 mg 5 mg    Description        Continue current warfarin dose of 5mg  1 tablet daily.      INR was 2.5 today

## 2015-12-14 DIAGNOSIS — M1611 Unilateral primary osteoarthritis, right hip: Secondary | ICD-10-CM | POA: Diagnosis not present

## 2015-12-26 DIAGNOSIS — C44622 Squamous cell carcinoma of skin of right upper limb, including shoulder: Secondary | ICD-10-CM | POA: Diagnosis not present

## 2015-12-26 DIAGNOSIS — D1801 Hemangioma of skin and subcutaneous tissue: Secondary | ICD-10-CM | POA: Diagnosis not present

## 2015-12-26 DIAGNOSIS — L570 Actinic keratosis: Secondary | ICD-10-CM | POA: Diagnosis not present

## 2015-12-26 DIAGNOSIS — D485 Neoplasm of uncertain behavior of skin: Secondary | ICD-10-CM | POA: Diagnosis not present

## 2015-12-26 DIAGNOSIS — D225 Melanocytic nevi of trunk: Secondary | ICD-10-CM | POA: Diagnosis not present

## 2015-12-26 DIAGNOSIS — L812 Freckles: Secondary | ICD-10-CM | POA: Diagnosis not present

## 2015-12-26 DIAGNOSIS — L821 Other seborrheic keratosis: Secondary | ICD-10-CM | POA: Diagnosis not present

## 2015-12-26 DIAGNOSIS — D2372 Other benign neoplasm of skin of left lower limb, including hip: Secondary | ICD-10-CM | POA: Diagnosis not present

## 2015-12-27 ENCOUNTER — Other Ambulatory Visit (INDEPENDENT_AMBULATORY_CARE_PROVIDER_SITE_OTHER): Payer: Medicare Other

## 2015-12-27 DIAGNOSIS — R300 Dysuria: Secondary | ICD-10-CM

## 2015-12-27 DIAGNOSIS — E559 Vitamin D deficiency, unspecified: Secondary | ICD-10-CM | POA: Diagnosis not present

## 2015-12-27 DIAGNOSIS — I10 Essential (primary) hypertension: Secondary | ICD-10-CM | POA: Diagnosis not present

## 2015-12-27 LAB — POCT URINALYSIS DIPSTICK
BILIRUBIN UA: NEGATIVE
GLUCOSE UA: NEGATIVE
KETONES UA: NEGATIVE
Nitrite, UA: NEGATIVE
Protein, UA: NEGATIVE
RBC UA: NEGATIVE
SPEC GRAV UA: 1.015
UROBILINOGEN UA: NEGATIVE
pH, UA: 5

## 2015-12-27 LAB — POCT UA - MICROSCOPIC ONLY
CRYSTALS, UR, HPF, POC: NEGATIVE
Casts, Ur, LPF, POC: NEGATIVE
MUCUS UA: NEGATIVE
Yeast, UA: NEGATIVE

## 2015-12-28 LAB — HEPATIC FUNCTION PANEL
ALK PHOS: 84 IU/L (ref 39–117)
ALT: 13 IU/L (ref 0–32)
AST: 23 IU/L (ref 0–40)
Albumin: 3.6 g/dL (ref 3.5–4.7)
BILIRUBIN TOTAL: 0.2 mg/dL (ref 0.0–1.2)
BILIRUBIN, DIRECT: 0.08 mg/dL (ref 0.00–0.40)
Total Protein: 6.2 g/dL (ref 6.0–8.5)

## 2015-12-28 LAB — BMP8+EGFR
BUN/Creatinine Ratio: 36 — ABNORMAL HIGH (ref 11–26)
BUN: 28 mg/dL — AB (ref 8–27)
CALCIUM: 8.8 mg/dL (ref 8.7–10.3)
CO2: 26 mmol/L (ref 18–29)
CREATININE: 0.77 mg/dL (ref 0.57–1.00)
Chloride: 104 mmol/L (ref 96–106)
GFR, EST AFRICAN AMERICAN: 84 mL/min/{1.73_m2} (ref >59)
GFR, EST NON AFRICAN AMERICAN: 73 mL/min/{1.73_m2} (ref >59)
Glucose: 80 mg/dL (ref 65–99)
Potassium: 4.3 mmol/L (ref 3.5–5.2)
Sodium: 144 mmol/L (ref 134–144)

## 2015-12-28 LAB — NMR, LIPOPROFILE
Cholesterol: 181 mg/dL (ref 100–199)
HDL Cholesterol by NMR: 66 mg/dL (ref >39)
HDL Particle Number: 32.9 umol/L (ref >=30.5)
LDL PARTICLE NUMBER: 988 nmol/L (ref <1000)
LDL SIZE: 21.6 nm (ref >20.5)
LDL-C: 99 mg/dL (ref 0–99)
Small LDL Particle Number: 137 nmol/L (ref <=527)
Triglycerides by NMR: 82 mg/dL (ref 0–149)

## 2015-12-28 LAB — CBC WITH DIFFERENTIAL/PLATELET
BASOS: 1 %
Basophils Absolute: 0 10*3/uL (ref 0.0–0.2)
EOS (ABSOLUTE): 0.2 10*3/uL (ref 0.0–0.4)
EOS: 3 %
HEMOGLOBIN: 12.8 g/dL (ref 11.1–15.9)
Hematocrit: 38.6 % (ref 34.0–46.6)
IMMATURE GRANS (ABS): 0 10*3/uL (ref 0.0–0.1)
IMMATURE GRANULOCYTES: 0 %
LYMPHS: 30 %
Lymphocytes Absolute: 1.7 10*3/uL (ref 0.7–3.1)
MCH: 30.8 pg (ref 26.6–33.0)
MCHC: 33.2 g/dL (ref 31.5–35.7)
MCV: 93 fL (ref 79–97)
MONOCYTES: 10 %
Monocytes Absolute: 0.6 10*3/uL (ref 0.1–0.9)
NEUTROS ABS: 3.3 10*3/uL (ref 1.4–7.0)
NEUTROS PCT: 56 %
Platelets: 163 10*3/uL (ref 150–379)
RBC: 4.15 x10E6/uL (ref 3.77–5.28)
RDW: 14.4 % (ref 12.3–15.4)
WBC: 5.8 10*3/uL (ref 3.4–10.8)

## 2015-12-28 LAB — URINE CULTURE

## 2015-12-28 LAB — VITAMIN D 25 HYDROXY (VIT D DEFICIENCY, FRACTURES): VIT D 25 HYDROXY: 37.7 ng/mL (ref 30.0–100.0)

## 2016-01-01 ENCOUNTER — Encounter: Payer: Self-pay | Admitting: Family Medicine

## 2016-01-01 ENCOUNTER — Ambulatory Visit (INDEPENDENT_AMBULATORY_CARE_PROVIDER_SITE_OTHER): Payer: Medicare Other | Admitting: Family Medicine

## 2016-01-01 VITALS — BP 134/62 | HR 62 | Temp 97.7°F | Ht 62.0 in | Wt 135.0 lb

## 2016-01-01 DIAGNOSIS — I482 Chronic atrial fibrillation, unspecified: Secondary | ICD-10-CM

## 2016-01-01 DIAGNOSIS — R350 Frequency of micturition: Secondary | ICD-10-CM

## 2016-01-01 DIAGNOSIS — I10 Essential (primary) hypertension: Secondary | ICD-10-CM | POA: Diagnosis not present

## 2016-01-01 LAB — POCT URINALYSIS DIPSTICK
Bilirubin, UA: NEGATIVE
GLUCOSE UA: NEGATIVE
KETONES UA: NEGATIVE
Leukocytes, UA: NEGATIVE
Nitrite, UA: NEGATIVE
Protein, UA: NEGATIVE
RBC UA: NEGATIVE
SPEC GRAV UA: 1.01
UROBILINOGEN UA: NEGATIVE
pH, UA: 7.5

## 2016-01-01 LAB — POCT UA - MICROSCOPIC ONLY
CASTS, UR, LPF, POC: NEGATIVE
CRYSTALS, UR, HPF, POC: NEGATIVE
RBC, urine, microscopic: NEGATIVE
YEAST UA: NEGATIVE

## 2016-01-01 LAB — POCT INR: INR: 2.5

## 2016-01-01 NOTE — Patient Instructions (Addendum)
Medicare Annual Wellness Visit  [facility] and the medical providers at [facility] strive to bring you the best medical care.  In doing so we not only want to address your current medical conditions and concerns but also to detect new conditions early and prevent illness, disease and health-related problems.    Medicare offers a yearly Wellness Visit which allows our clinical staff to assess your need for preventative services including immunizations, lifestyle education, counseling to decrease risk of preventable diseases and screening for fall risk and other medical concerns.    This visit is provided free of charge (no copay) for all Medicare recipients. The clinical pharmacists at [facility] have begun to conduct these Wellness Visits which will also include a thorough review of all your medications.    As you primary medical provider recommend that you make an appointment for your Annual Wellness Visit if you have not done so already this year.  You may set up this appointment before you leave today or you may call back [id number] and schedule an appointment.  Please make sure when you call that you mention that you are scheduling your Annual Wellness Visit with the clinical pharmacist so that the appointment may be made for the proper length of time.     Continue current medications. Continue good therapeutic lifestyle changes which include good diet and exercise. Fall precautions discussed with patient. If an FOBT was given today- please return it to our front desk. If you are over 54 years old - you may need Prevnar 76 or the adult Pneumonia vaccine.  **Flu shots are available--- please call and schedule a FLU-CLINIC appointment**  After your visit with [place] today you will receive a survey in the mail or online from [employer] regarding your care with [place]. Please take a moment to fill this out. Your feedback is very  important to [place] as you can help [place] better understand your patient needs as well as improve your experience and satisfaction. WE CARE ABOUT YOU!!!   Continue to follow-up with orthopedist in regard to hip pain The skin lesions appear to be healing well although we have had recently excised. Continue to monitor blood pressures and leave off fluid pill if blood pressures run low Try to correlate heart rhythm and irregularity with low blood pressure readings

## 2016-01-01 NOTE — Progress Notes (Signed)
Subjective:    Patient ID: Susan Fitzgerald, female    DOB: 23-May-1932, 82 y.o.   MRN: [id number]  HPI Pt here for follow up and management of chronic medical problems which includes hypertension and a fib. She is taking medications regularly. The patient today is concerned about her blood pressure fluctuating a lot. She also still complains of some urine frequency. She is concerned about a skin lesion and that it may be infected. She also complains of some right hip pain. Recent lab work is reviewed with patient and everything was excellent except the vitamin D was a little bit low and she will increase her vitamin D intake. She denies chest pain or shortness of breath. She is not having any problems with her GI tract including heartburn and indigestion nausea vomiting diarrhea or blood in the stool. She is passing her water without problems. She's had her eyes checked. She is concerned that at times her blood pressure drops into sometimes as low as the 70s over the 40s. She is not certain if her heart is in atrial fibrillation at that time. She also has at times problems with voiding a lot of urine and she is not sure if that's associated with having her heart being irregular before that and taking a fluid pill. She has been to the dermatologist raising and has had several skin lesions removed.       Patient Active Problem List   Diagnosis Date Noted  . Symptomatic bradycardia 06/02/2013  . Hypertension 04/21/2013  . Osteoporosis 04/21/2013  . Atrial fibrillation, chronic ([place]) 02/09/2013   Outpatient Encounter Prescriptions as of 01/01/2016  Medication Sig  . amLODipine (NORVASC) 5 MG tablet Take 1 tablet by mouth  daily  . calcium-vitamin D (OSCAL WITH D) 250-125 MG-UNIT per tablet Take 1 tablet by mouth 2 (two) times daily.  . flecainide (TAMBOCOR) 150 MG tablet Take one-half tablet by  mouth two times daily  . furosemide (LASIX) 20 MG tablet Take 1 to 2 tablets by  mouth daily as needed    . lisinopril (PRINIVIL,ZESTRIL) 40 MG tablet Take 1 tablet by mouth  daily  . Multiple Vitamin (MULTIVITAMIN) capsule Take 1 capsule by mouth daily.  . pindolol (VISKEN) 5 MG tablet Take 1 tablet (5 mg total) by mouth 2 (two) times daily.  . potassium chloride SA (K-DUR,KLOR-CON) 20 MEQ tablet Take 1 tablet by mouth  daily  . warfarin (COUMADIN) 5 MG tablet Take 1 tablet by mouth once daily or as directed   No facility-administered encounter medications on file as of 01/01/2016.      Review of Systems  Constitutional: Negative.   HENT: Negative.   Eyes: Negative.   Respiratory: Negative.   Cardiovascular: Negative.        Fluctuating BP   Gastrointestinal: Negative.   Endocrine: Negative.   Genitourinary: Positive for frequency.  Musculoskeletal: Negative.   Skin: Negative.   Allergic/Immunologic: Negative.   Neurological: Negative.   Hematological: Negative.   Psychiatric/Behavioral: Negative.        Objective:   Physical Exam  Constitutional: She is oriented to person, place, and time. She appears well-developed and well-nourished. No distress.  Alert and cooperative  HENT:  Head: Normocephalic and atraumatic.  Right Ear: External ear normal.  Left Ear: External ear normal.  Nose: Nose normal.  Mouth/Throat: Oropharynx is clear and moist.  Eyes: Conjunctivae and EOM are normal. Pupils are equal, round, and reactive to light. Right eye exhibits no discharge. Left  eye exhibits no discharge. No scleral icterus.  Neck: Normal range of motion. Neck supple. No thyromegaly present.  Cardiovascular: Normal rate, regular rhythm, normal heart sounds and intact distal pulses.   No murmur heard. Today, the heart has a regular rate and rhythm at 72/m  Pulmonary/Chest: Effort normal and breath sounds normal. No respiratory distress. She has no wheezes. She has no rales. She exhibits no tenderness.  Clear anteriorly and posteriorly  Abdominal: Soft. Bowel sounds are normal. She  exhibits no mass. There is no tenderness. There is no rebound and no guarding.  No liver or spleen enlargement and no epigastric or suprapubic tenderness  Musculoskeletal: Normal range of motion. She exhibits no edema or tenderness.  Lymphadenopathy:    She has no cervical adenopathy.  Neurological: She is alert and oriented to person, place, and time. She has normal reflexes. No cranial nerve deficit.  Skin: Skin is warm and dry. No rash noted.  Healing skin lesion excision sites  Psychiatric: She has a normal mood and affect. Her behavior is normal. Judgment and thought content normal.  Nursing note and vitals reviewed.   BP 134/62 mmHg  Pulse 62  Temp(Src) 97.7 F (36.5 C) (Oral)  Ht 5\' 2"  (1.575 m)  Wt 135 lb (61.236 kg)  BMI 24.69 kg/m2       Assessment & Plan:  1. Essential hypertension -The blood pressure is good today the patient will continue with current treatment  2. Atrial fibrillation, chronic (HCC) -The heart is regular today and no change in treatment and regular follow-up with cardiology - POCT INR  3. Urine frequency -The urine today had only an occasional WBC and no RBC. She is not having any burning or frequency other than these episodes of frequency mention in the note and there is no infection and told her that no antibiotic would the needed as she is not symptomatic. - POCT urinalysis dipstick - POCT UA - Microscopic Only - Urine culture  Patient Instructions                       Medicare Annual Wellness Visit  [facility] and the medical providers at [facility] strive to bring you the best medical care.  In doing so we not only want to address your current medical conditions and concerns but also to detect new conditions early and prevent illness, disease and health-related problems.    Medicare offers a yearly Wellness Visit which allows our clinical staff to assess your need for preventative services including immunizations,  lifestyle education, counseling to decrease risk of preventable diseases and screening for fall risk and other medical concerns.    This visit is provided free of charge (no copay) for all Medicare recipients. The clinical pharmacists at [facility] have begun to conduct these Wellness Visits which will also include a thorough review of all your medications.    As you primary medical provider recommend that you make an appointment for your Annual Wellness Visit if you have not done so already this year.  You may set up this appointment before you leave today or you may call back [phone] and schedule an appointment.  Please make sure when you call that you mention that you are scheduling your Annual Wellness Visit with the clinical pharmacist so that the appointment may be made for the proper length of time.     Continue current medications. Continue good therapeutic lifestyle changes which include good  diet and exercise. Fall precautions discussed with patient. If an FOBT was given today- please return it to our front desk. If you are over 84 years old - you may need Prevnar 62 or the adult Pneumonia vaccine.  **Flu shots are available--- please call and schedule a FLU-CLINIC appointment**  After your visit with [place] today you will receive a survey in the mail or online from [employer] regarding your care with [place]. Please take a moment to fill this out. Your feedback is very important to [place] as you can help [place] better understand your patient needs as well as improve your experience and satisfaction. WE CARE ABOUT YOU!!!   Continue to follow-up with orthopedist in regard to hip pain The skin lesions appear to be healing well although we have had recently excised. Continue to monitor blood pressures and leave off fluid pill if blood pressures run low Try to correlate heart rhythm and irregularity with low blood pressure readings   Arrie Senate MD

## 2016-01-03 LAB — URINE CULTURE

## 2016-01-09 ENCOUNTER — Ambulatory Visit (INDEPENDENT_AMBULATORY_CARE_PROVIDER_SITE_OTHER): Payer: Medicare Other | Admitting: Nurse Practitioner

## 2016-01-09 ENCOUNTER — Encounter: Payer: Self-pay | Admitting: Nurse Practitioner

## 2016-01-09 VITALS — BP 135/63 | HR 60 | Temp 98.0°F | Ht 62.0 in | Wt 135.4 lb

## 2016-01-09 DIAGNOSIS — Z01419 Encounter for gynecological examination (general) (routine) without abnormal findings: Secondary | ICD-10-CM | POA: Diagnosis not present

## 2016-01-09 NOTE — Patient Instructions (Signed)
Pap Test WHY AM I HAVING THIS TEST? A pap test is sometimes called a pap smear. It is a screening test that is used to check for signs of cancer of the vagina, cervix, and uterus. The test can also identify the presence of infection or precancerous changes. Your health care provider will likely recommend you have this test done on a regular basis. This test may be done:  Every 3 years, starting at age 80.  Every 5 years, in combination with testing for the presence of human papillomavirus (HPV).  More or less often depending on other medical conditions.  WHAT KIND OF SAMPLE IS TAKEN? Using a small cotton swab, plastic spatula, or brush, your health care provider will collect a sample of cells from the surface of your cervix. Your cervix is the opening to your uterus, also called a womb. Secretions from the cervix and vagina may also be collected. HOW DO I PREPARE FOR THE TEST?  Be aware of where you are in your menstrual cycle. You may be asked to reschedule the test if you are menstruating on the day of the test.  You may need to reschedule if you have a known vaginal infection on the day of the test.  You may be asked to avoid douching or taking a bath the day before or the day of the test.  Some medicines can cause abnormal test results, such as digitalis and tetracycline. Talk with your health care provider before your test if you take one of these medicines. WHAT DO THE RESULTS MEAN? Abnormal test results may indicate a number of health conditions. These may include:  Cancer. Although pap test results cannot be used to diagnose cancer of the cervix, vagina, or uterus, they may suggest the possibility of cancer. Further tests would be required to determine if cancer is present.  Sexually transmitted disease.  Fungal infection.  Parasite infection.  Herpes infection.  A condition causing or contributing to infertility. It is your responsibility to obtain your test results. Ask  the lab or department performing the test when and how you will get your results. Contact your health care provider to discuss any questions you have about your results.   This information is not intended to replace advice given to you by your health care provider. Make sure you discuss any questions you have with your health care provider.   Document Released: 01/25/2003 Document Revised: 11/25/2014 Document Reviewed: 03/28/2014 Elsevier Interactive Patient Education 2016 Elsevier Inc.  

## 2016-01-09 NOTE — Progress Notes (Signed)
   Subjective:    Patient ID: Susan Fitzgerald, female    DOB: Jan 03, 1932, 82 y.o.   MRN: [id number]  HPI Patient comes in today for PAP and pelvic- she is a regular patient of Dr. Laurance Flatten that was seen for follow up on 01/01/16. SHe is doing well without complaints.    Review of Systems  Constitutional: Negative.   HENT: Negative.   Respiratory: Negative.   Cardiovascular: Negative.   Genitourinary: Negative.   Neurological: Negative.   Hematological: Negative.   All other systems reviewed and are negative.      Objective:   Physical Exam  Constitutional: She is oriented to person, place, and time. She appears well-developed and well-nourished.  HENT:  Head: Normocephalic.  Right Ear: Hearing, tympanic membrane, external ear and ear canal normal.  Left Ear: Hearing, tympanic membrane, external ear and ear canal normal.  Nose: Nose normal.  Mouth/Throat: Uvula is midline and oropharynx is clear and moist.  Eyes: Conjunctivae and EOM are normal. Pupils are equal, round, and reactive to light.  Neck: Normal range of motion and full passive range of motion without pain. Neck supple. No JVD present. Carotid bruit is not present. No thyroid mass and no thyromegaly present.  Cardiovascular: Normal rate, normal heart sounds and intact distal pulses.   No murmur heard. Pulmonary/Chest: Effort normal and breath sounds normal. Right breast exhibits no inverted nipple, no mass, no nipple discharge, no skin change and no tenderness. Left breast exhibits no inverted nipple, no mass, no nipple discharge, no skin change and no tenderness.  Abdominal: Soft. Bowel sounds are normal. She exhibits no mass. There is no tenderness.  Genitourinary: Vagina normal and uterus normal. No breast swelling, tenderness, discharge or bleeding.  bimanual exam-No adnexal masses or tenderness. Cervical stenosis No vaginal discharge  Musculoskeletal: Normal range of motion.  Lymphadenopathy:    She has no cervical  adenopathy.  Neurological: She is alert and oriented to person, place, and time.  Skin: Skin is warm and dry.  Psychiatric: She has a normal mood and affect. Her behavior is normal. Judgment and thought content normal.   BP 135/63 mmHg  Pulse 60  Temp(Src) 98 F (36.7 C) (Oral)  Ht 5\' 2"  (1.575 m)  Wt 135 lb 6.4 oz (61.417 kg)  BMI 24.76 kg/m2        Assessment & Plan:   1. Encounter for routine gynecological examination    Keep follow up appointment with Dr. Mayra Neer, FNP

## 2016-01-11 LAB — PAP IG (IMAGE GUIDED): PAP Smear Comment: 0

## 2016-01-19 DIAGNOSIS — Z1211 Encounter for screening for malignant neoplasm of colon: Secondary | ICD-10-CM | POA: Diagnosis not present

## 2016-01-29 ENCOUNTER — Ambulatory Visit (INDEPENDENT_AMBULATORY_CARE_PROVIDER_SITE_OTHER): Payer: Medicare Other | Admitting: Family Medicine

## 2016-01-29 ENCOUNTER — Ambulatory Visit (INDEPENDENT_AMBULATORY_CARE_PROVIDER_SITE_OTHER): Payer: Medicare Other

## 2016-01-29 ENCOUNTER — Encounter: Payer: Self-pay | Admitting: Family Medicine

## 2016-01-29 VITALS — BP 169/81 | HR 64 | Temp 97.8°F | Ht 62.0 in | Wt 135.0 lb

## 2016-01-29 DIAGNOSIS — M542 Cervicalgia: Secondary | ICD-10-CM | POA: Diagnosis not present

## 2016-01-29 DIAGNOSIS — I482 Chronic atrial fibrillation, unspecified: Secondary | ICD-10-CM

## 2016-01-29 DIAGNOSIS — M25532 Pain in left wrist: Secondary | ICD-10-CM

## 2016-01-29 LAB — COAGUCHEK XS/INR WAIVED
INR: 2.8 — ABNORMAL HIGH (ref 0.9–1.1)
PROTHROMBIN TIME: 33.9 s

## 2016-01-29 NOTE — Progress Notes (Signed)
BP 169/81 mmHg  Pulse 64  Temp(Src) 97.8 F (36.6 C) (Oral)  Ht 5\' 2"  (1.575 m)  Wt 135 lb (61.236 kg)  BMI 24.69 kg/m2   Subjective:    Patient ID: Susan Fitzgerald, female    DOB: 1931/12/27, 82 y.o.   MRN: [id number]  HPI: Susan Fitzgerald is a 82 y.o. female presenting on 01/29/2016 for Hand Pain and Coagulation Disorder   HPI Left wrist pain Patient has been having left wrist pain and swelling that has been intermittently going on for the past couple months. She would like to have an x-ray to see if she can tell for sure if it's arthritis or something else. She does have some swelling on the posterior aspect of her wrist and also has significant tenderness on the first carpal metacarpal joint. She denies any fevers or chills or warmth or redness in the joint itself.  Neck arthritis Patient has been having left lateral neck pain that is worse when she turns her head from side to side. She also complains of a grinding sensation when she turns her head from side to side. The pain is been intermittent for the past few months. She denies any tingling or numbness going down her arms or into her legs or weakness in either side. There is no midline pain either.  Relevant past medical, surgical, family and social history reviewed and updated as indicated. Interim medical history since our last visit reviewed. Allergies and medications reviewed and updated.  Review of Systems  Constitutional: Negative for fever and chills.  HENT: Negative for congestion, ear discharge and ear pain.   Eyes: Negative for redness and visual disturbance.  Respiratory: Negative for chest tightness and shortness of breath.   Cardiovascular: Negative for chest pain and leg swelling.  Genitourinary: Negative for dysuria and difficulty urinating.  Musculoskeletal: Positive for myalgias, joint swelling, arthralgias and neck pain. Negative for back pain and gait problem.  Skin: Negative for rash.  Neurological:  Negative for light-headedness and headaches.  Psychiatric/Behavioral: Negative for behavioral problems and agitation.  All other systems reviewed and are negative.   Per HPI unless specifically indicated above     Medication List       This list is accurate as of: 01/29/16  2:43 PM.  Always use your most recent med list.               amLODipine 5 MG tablet  Commonly known as:  NORVASC  Take 1 tablet by mouth  daily     calcium-vitamin D 250-125 MG-UNIT tablet  Commonly known as:  OSCAL WITH D  Take 1 tablet by mouth 2 (two) times daily.     flecainide 150 MG tablet  Commonly known as:  TAMBOCOR  Take one-half tablet by  mouth two times daily     furosemide 20 MG tablet  Commonly known as:  LASIX  Take 1 to 2 tablets by  mouth daily as needed     lisinopril 40 MG tablet  Commonly known as:  PRINIVIL,ZESTRIL  Take 1 tablet by mouth  daily     multivitamin capsule  Take 1 capsule by mouth daily.     pindolol 5 MG tablet  Commonly known as:  VISKEN  Take 1 tablet (5 mg total) by mouth 2 (two) times daily.     potassium chloride SA 20 MEQ tablet  Commonly known as:  K-DUR,KLOR-CON  Take 1 tablet by mouth  daily  warfarin 5 MG tablet  Commonly known as:  COUMADIN  Take 1 tablet by mouth once daily or as directed           Objective:    BP 169/81 mmHg  Pulse 64  Temp(Src) 97.8 F (36.6 C) (Oral)  Ht 5\' 2"  (1.575 m)  Wt 135 lb (61.236 kg)  BMI 24.69 kg/m2  Wt Readings from Last 3 Encounters:  01/29/16 135 lb (61.236 kg)  01/09/16 135 lb 6.4 oz (61.417 kg)  01/01/16 135 lb (61.236 kg)    Physical Exam  Constitutional: She is oriented to person, place, and time. She appears well-developed and well-nourished. No distress.  Eyes: Conjunctivae and EOM are normal. Pupils are equal, round, and reactive to light.  Cardiovascular: Normal rate, regular rhythm, normal heart sounds and intact distal pulses.   No murmur heard. Pulmonary/Chest: Effort normal  and breath sounds normal. No respiratory distress. She has no wheezes.  Musculoskeletal: Normal range of motion. She exhibits no edema.       Left wrist: She exhibits tenderness (Tenderness over posterior wrist and first carpometacarpal joint anteriorly, swelling of her wrist as well, no signs of erythema or warmth or infection), bony tenderness and swelling. She exhibits normal range of motion, no effusion and no crepitus.       Cervical back: She exhibits tenderness (Paraspinal tenderness on the left side). She exhibits normal range of motion, no bony tenderness, no swelling, no edema and no deformity.  Neurological: She is alert and oriented to person, place, and time. Coordination normal.  Skin: Skin is warm and dry. No rash noted. She is not diaphoretic.  Psychiatric: She has a normal mood and affect. Her behavior is normal.  Nursing note and vitals reviewed.   Results for orders placed or performed in visit on 01/09/16  Pap IG (Image Guided)  Result Value Ref Range   DIAGNOSIS: Comment    Specimen adequacy: Comment    CLINICIAN PROVIDED ICD10: Comment    Performed by: Comment    PAP SMEAR COMMENT .    Note: Comment    Test Methodology Comment       Assessment & Plan:   Problem List Items Addressed This Visit    None    Visit Diagnoses    Left wrist pain    -  Primary    Likely osteoarthritis, has swelling as well, we'll do x-rays, patient can't do anti-inflammatories, recommended ice and heat    Relevant Orders    DG Hand Complete Left    DG Wrist Complete Left    Cervical pain (neck)        We'll do C-spine x-rays, likely muscular as it's mostly on the left lateral side, patient can't do anti-inflammatories recommend stretching and massage    Relevant Orders    DG Cervical Spine Complete        Follow up plan: Return if symptoms worsen or fail to improve.  Counseling provided for all of the vaccine components Orders Placed This Encounter  Procedures  . DG Cervical  Spine Complete  . DG Hand Complete Left  . DG Wrist Complete Left    Caryl Pina, MD [facility] 01/29/2016, 2:43 PM

## 2016-01-29 NOTE — Patient Instructions (Signed)
Continue current dose of Coumadin

## 2016-01-30 DIAGNOSIS — Z1211 Encounter for screening for malignant neoplasm of colon: Secondary | ICD-10-CM | POA: Diagnosis not present

## 2016-02-05 ENCOUNTER — Telehealth: Payer: Self-pay | Admitting: Pharmacist

## 2016-02-05 ENCOUNTER — Encounter: Payer: Self-pay | Admitting: Pharmacist

## 2016-02-05 DIAGNOSIS — M87051 Idiopathic aseptic necrosis of right femur: Secondary | ICD-10-CM | POA: Diagnosis not present

## 2016-02-05 DIAGNOSIS — M25551 Pain in right hip: Secondary | ICD-10-CM | POA: Diagnosis not present

## 2016-02-05 NOTE — Telephone Encounter (Signed)
Yes go ahead and call her in Voltaren gel 1 tube of whatever size they have

## 2016-02-05 NOTE — Telephone Encounter (Signed)
Patient is having some back pain and thinks this is pulled muscle.  A friend recommended Volteran Gel and was wondering if she can get this.  She saw orthopedist today but didn't discuss with him.  She also saw Dr Dettinger for wrist and neck pain last week.  I will refer to PCP or Dr Dettinger.

## 2016-02-06 MED ORDER — DICLOFENAC SODIUM 1 % TD GEL: 2.0000 g | Freq: Four times a day (QID) | TRANSDERMAL | Status: DC

## 2016-02-06 NOTE — Telephone Encounter (Signed)
Detailed message left for patient.

## 2016-02-07 ENCOUNTER — Other Ambulatory Visit: Payer: Self-pay | Admitting: Family Medicine

## 2016-02-12 ENCOUNTER — Other Ambulatory Visit: Payer: Self-pay | Admitting: Family Medicine

## 2016-02-13 ENCOUNTER — Telehealth: Payer: Self-pay

## 2016-02-13 NOTE — Telephone Encounter (Signed)
Insurance prior authorized Diclofenac Gel through 11/17/16

## 2016-02-14 ENCOUNTER — Other Ambulatory Visit: Payer: Self-pay | Admitting: Family Medicine

## 2016-02-15 ENCOUNTER — Ambulatory Visit (INDEPENDENT_AMBULATORY_CARE_PROVIDER_SITE_OTHER): Payer: Medicare Other | Admitting: Family Medicine

## 2016-02-15 ENCOUNTER — Encounter: Payer: Self-pay | Admitting: Family Medicine

## 2016-02-15 VITALS — BP 136/71 | HR 66 | Temp 98.6°F | Ht 62.0 in | Wt 133.0 lb

## 2016-02-15 DIAGNOSIS — G589 Mononeuropathy, unspecified: Secondary | ICD-10-CM

## 2016-02-15 DIAGNOSIS — M542 Cervicalgia: Secondary | ICD-10-CM | POA: Diagnosis not present

## 2016-02-15 DIAGNOSIS — M79642 Pain in left hand: Secondary | ICD-10-CM | POA: Diagnosis not present

## 2016-02-15 DIAGNOSIS — M79641 Pain in right hand: Secondary | ICD-10-CM | POA: Diagnosis not present

## 2016-02-15 NOTE — Progress Notes (Signed)
Subjective:    Patient ID: Susan Fitzgerald, female    DOB: 02-01-1932, 82 y.o.   MRN: [id number]  HPI Patient here today for bilateral hand arthritis / pain. The patient continues to have pain in both hands and her neck. Her recent C-spine films were reviewed as well as those of her left hand. The C-spine films showed some degenerative changes and retrolisthesis at a couple of levels and the hand film showed mild arthritis.The left hand has been hurting for a good while. It has been worse the past 2 weeks. The right hand started hurting 3-4 days ago and she took some ibuprofen which she is not supposed to do with Coumadin and this seemed to relieve the right hand. She continues to have problems in her neck and she also has an electrical sensation in her right hand. Previous C-spine films showed degenerative changes in both the neck and the left hand.     Patient Active Problem List   Diagnosis Date Noted  . Symptomatic bradycardia 06/02/2013  . Hypertension 04/21/2013  . Osteoporosis 04/21/2013  . Atrial fibrillation, chronic ([place]) 02/09/2013   Outpatient Encounter Prescriptions as of 02/15/2016  Medication Sig  . amLODipine (NORVASC) 5 MG tablet Take 1 tablet by mouth  daily  . calcium-vitamin D (OSCAL WITH D) 250-125 MG-UNIT per tablet Take 1 tablet by mouth 2 (two) times daily.  . diclofenac sodium (VOLTAREN) 1 % GEL Apply 2 g topically 4 (four) times daily.  . flecainide (TAMBOCOR) 150 MG tablet Take one-half tablet by  mouth two times daily  . furosemide (LASIX) 20 MG tablet Take 1 to 2 tablets by  mouth daily as needed  . lisinopril (PRINIVIL,ZESTRIL) 40 MG tablet Take 1 tablet by mouth  daily  . Multiple Vitamin (MULTIVITAMIN) capsule Take 1 capsule by mouth daily.  . pindolol (VISKEN) 5 MG tablet Take 1 tablet (5 mg total) by mouth 2 (two) times daily.  . potassium chloride SA (K-DUR,KLOR-CON) 20 MEQ tablet Take 1 tablet by mouth  daily  . warfarin (COUMADIN) 5 MG tablet Take 1  tablet by mouth once daily or as directed   No facility-administered encounter medications on file as of 02/15/2016.     Review of Systems  Constitutional: Negative.   HENT: Negative.   Eyes: Negative.   Respiratory: Negative.   Cardiovascular: Negative.   Gastrointestinal: Negative.   Endocrine: Negative.   Genitourinary: Negative.   Musculoskeletal: Positive for arthralgias (bilateral hands and neck).  Skin: Negative.   Allergic/Immunologic: Negative.   Neurological: Negative.   Hematological: Negative.   Psychiatric/Behavioral: Negative.        Objective:   Physical Exam  Constitutional: She is oriented to person, place, and time. She appears well-developed and well-nourished.  Musculoskeletal: She exhibits edema and tenderness.  There is rubor over the left thumb and MP joints. The left thenar area and dorsal hand is swollen with warmth today. It is tender to palpation. The right wrist and hand appear to be normal with good mobility although this is been swollen recently according to the patient. There are good pulses bilaterally. The reflexes appear to be good bilaterally.  Neurological: She is alert and oriented to person, place, and time. She has normal reflexes. No cranial nerve deficit.  Skin: Skin is warm and dry. No rash noted. No erythema.  Psychiatric: She has a normal mood and affect. Her behavior is normal. Thought content normal.  Nursing note and vitals reviewed.   BP 136/71 mmHg  Pulse 66  Temp(Src) 98.6 F (37 C) (Oral)  Ht 5\' 2"  (1.575 m)  Wt 133 lb (60.328 kg)  BMI 24.32 kg/m2       Assessment & Plan:  1. Bilateral hand pain -Take Tylenol if needed for pain -When this information is returned with the x-rays and lab work we will make a decision about a rheumatologist or an orthopedic hand surgeon. - Arthritis Panel - Cyclic Citrul Peptide Antibody, IGG  2. Neck pain -Because of continued pain in the neck we will get a cervical spine MRI also she  seems to have a peripheral neuropathy especially affecting the right hand and so we need the films for that also.  3. Mononeuropathy -MRI to evaluate its effect on the right hand  Patient Instructions  We will do a referral to get an MRI of the cervical spine because of continued neck pain and peripheral neuropathy We will do our best to get an open MRI and we will make sure that the scheduling facility understands this. We will get blood work and we will call you with the results of this as soon as it is returned We'll make a decision at that point in time if we send you to a hand specialist or a rheumatologist You should avoid NSAIDs as you're increasing the wrist of a GI bleed with Coumadin   Arrie Senate MD

## 2016-02-15 NOTE — Patient Instructions (Signed)
We will do a referral to get an MRI of the cervical spine because of continued neck pain and peripheral neuropathy We will do our best to get an open MRI and we will make sure that the scheduling facility understands this. We will get blood work and we will call you with the results of this as soon as it is returned We'll make a decision at that point in time if we send you to a hand specialist or a rheumatologist You should avoid NSAIDs as you're increasing the wrist of a GI bleed with Coumadin

## 2016-02-16 ENCOUNTER — Telehealth: Payer: Self-pay | Admitting: Pharmacist

## 2016-02-16 LAB — ARTHRITIS PANEL
BASOS ABS: 0 10*3/uL (ref 0.0–0.2)
Basos: 1 %
EOS (ABSOLUTE): 0.2 10*3/uL (ref 0.0–0.4)
EOS: 4 %
Hematocrit: 36.3 % (ref 34.0–46.6)
Hemoglobin: 12 g/dL (ref 11.1–15.9)
IMMATURE GRANULOCYTES: 0 %
Immature Grans (Abs): 0 10*3/uL (ref 0.0–0.1)
LYMPHS: 24 %
Lymphocytes Absolute: 1.3 10*3/uL (ref 0.7–3.1)
MCH: 30.3 pg (ref 26.6–33.0)
MCHC: 33.1 g/dL (ref 31.5–35.7)
MCV: 92 fL (ref 79–97)
MONOCYTES: 14 %
MONOS ABS: 0.7 10*3/uL (ref 0.1–0.9)
NEUTROS PCT: 57 %
Neutrophils Absolute: 3.1 10*3/uL (ref 1.4–7.0)
PLATELETS: 238 10*3/uL (ref 150–379)
RBC: 3.96 x10E6/uL (ref 3.77–5.28)
RDW: 13.6 % (ref 12.3–15.4)
Rhuematoid fact SerPl-aCnc: 23.2 IU/mL — ABNORMAL HIGH (ref 0.0–13.9)
Sed Rate: 42 mm/hr — ABNORMAL HIGH (ref 0–40)
Uric Acid: 4.8 mg/dL (ref 2.5–7.1)
WBC: 5.4 10*3/uL (ref 3.4–10.8)

## 2016-02-16 LAB — CYCLIC CITRUL PEPTIDE ANTIBODY, IGG/IGA: CYCLIC CITRULLIN PEPTIDE AB: 24 U — AB (ref 0–19)

## 2016-02-16 MED ORDER — DIAZEPAM 5 MG PO TABS: ORAL_TABLET | ORAL | Status: DC

## 2016-02-16 MED ORDER — TRAMADOL HCL 50 MG PO TABS
25.0000 mg | ORAL_TABLET | Freq: Three times a day (TID) | ORAL | Status: DC | PRN
Start: 2016-02-16 — End: 2016-04-18

## 2016-02-16 NOTE — Telephone Encounter (Signed)
If it extra strength Tylenol was not working and would go to tramadol neck there would be 50 mg 2-3 times a day #30

## 2016-02-16 NOTE — Telephone Encounter (Signed)
Patient has MRI 02/28/2016 - has history of claustrophobia.  Requesting valium to take prior to MRI.  Will forward to PCP.  Also patient asked about getting handicap placard. Will forward to [place] to start paperwork.

## 2016-02-16 NOTE — Telephone Encounter (Signed)
rx for tramdol 50mg  1/2 to 2 tablet q8hours as needed for pain called to [facility] per Dr Sabra Heck.  Patient notified.

## 2016-02-17 ENCOUNTER — Telehealth: Payer: Self-pay | Admitting: *Deleted

## 2016-02-17 NOTE — Telephone Encounter (Signed)
Script for valium is ready for patient.

## 2016-02-20 DIAGNOSIS — M1611 Unilateral primary osteoarthritis, right hip: Secondary | ICD-10-CM | POA: Diagnosis not present

## 2016-02-21 NOTE — Telephone Encounter (Signed)
Handicap form done, pt aware

## 2016-02-26 ENCOUNTER — Telehealth: Payer: Self-pay | Admitting: Family Medicine

## 2016-02-26 NOTE — Telephone Encounter (Signed)
Patient aware, need to get results from this MRI before pursuing more tests.

## 2016-02-28 ENCOUNTER — Other Ambulatory Visit: Payer: Medicare Other

## 2016-02-28 ENCOUNTER — Ambulatory Visit
Admission: RE | Admit: 2016-02-28 | Discharge: 2016-02-28 | Disposition: A | Discharge status: Home / Self Care | Payer: Medicare Other | Source: Ambulatory Visit | Attending: Family Medicine | Admitting: Family Medicine

## 2016-02-28 ENCOUNTER — Telehealth: Payer: Self-pay | Admitting: *Deleted

## 2016-02-28 DIAGNOSIS — M542 Cervicalgia: Secondary | ICD-10-CM

## 2016-02-28 DIAGNOSIS — R937 Abnormal findings on diagnostic imaging of other parts of musculoskeletal system: Secondary | ICD-10-CM

## 2016-02-28 DIAGNOSIS — G589 Mononeuropathy, unspecified: Secondary | ICD-10-CM

## 2016-02-28 DIAGNOSIS — M47812 Spondylosis without myelopathy or radiculopathy, cervical region: Secondary | ICD-10-CM | POA: Diagnosis not present

## 2016-02-28 NOTE — Telephone Encounter (Signed)
-----   Message from Zannie Cove, LPN sent at [id number]  3:34 PM EDT -----   ----- Message -----    From: Chipper Herb, MD    Sent: 02/28/2016   3:25 PM      To: Zannie Cove, LPN, Carlon Chumley, Rad Tech  Please call patient with results of this report and give her a copy of the report. The good news is is that there is no surgical lesion that needs to be operated on. There is some edema around one of the facets and this could be playing a role with the pain. Therefore I would refer the patient to Dr. Joya Salm group for further evaluation to see if an injection might be appropriate.

## 2016-03-04 ENCOUNTER — Telehealth: Payer: Self-pay | Admitting: Family Medicine

## 2016-03-04 DIAGNOSIS — R768 Other specified abnormal immunological findings in serum: Secondary | ICD-10-CM

## 2016-03-04 NOTE — Telephone Encounter (Signed)
Spoke to pt and put in referral for [facility] rheumatology.

## 2016-03-05 ENCOUNTER — Other Ambulatory Visit: Payer: Self-pay | Admitting: *Deleted

## 2016-03-06 ENCOUNTER — Telehealth: Payer: Self-pay | Admitting: Cardiology

## 2016-03-06 ENCOUNTER — Other Ambulatory Visit: Payer: Self-pay | Admitting: Family Medicine

## 2016-03-06 ENCOUNTER — Encounter: Payer: Self-pay | Admitting: Family Medicine

## 2016-03-06 ENCOUNTER — Ambulatory Visit (INDEPENDENT_AMBULATORY_CARE_PROVIDER_SITE_OTHER): Payer: Medicare Other | Admitting: Family Medicine

## 2016-03-06 VITALS — BP 122/71 | HR 110 | Temp 97.7°F | Ht 62.0 in | Wt 134.0 lb

## 2016-03-06 DIAGNOSIS — R791 Abnormal coagulation profile: Secondary | ICD-10-CM

## 2016-03-06 DIAGNOSIS — I10 Essential (primary) hypertension: Secondary | ICD-10-CM

## 2016-03-06 DIAGNOSIS — R5383 Other fatigue: Secondary | ICD-10-CM

## 2016-03-06 DIAGNOSIS — M542 Cervicalgia: Secondary | ICD-10-CM | POA: Diagnosis not present

## 2016-03-06 DIAGNOSIS — R768 Other specified abnormal immunological findings in serum: Secondary | ICD-10-CM | POA: Diagnosis not present

## 2016-03-06 DIAGNOSIS — I482 Chronic atrial fibrillation, unspecified: Secondary | ICD-10-CM

## 2016-03-06 DIAGNOSIS — R0600 Dyspnea, unspecified: Secondary | ICD-10-CM

## 2016-03-06 DIAGNOSIS — Z7901 Long term (current) use of anticoagulants: Secondary | ICD-10-CM | POA: Diagnosis not present

## 2016-03-06 DIAGNOSIS — K055 Other periodontal diseases: Secondary | ICD-10-CM | POA: Diagnosis not present

## 2016-03-06 DIAGNOSIS — M79641 Pain in right hand: Secondary | ICD-10-CM | POA: Diagnosis not present

## 2016-03-06 DIAGNOSIS — M79642 Pain in left hand: Secondary | ICD-10-CM

## 2016-03-06 DIAGNOSIS — K068 Other specified disorders of gingiva and edentulous alveolar ridge: Secondary | ICD-10-CM

## 2016-03-06 LAB — COAGUCHEK XS/INR WAIVED
INR: 6.1 (ref 0.9–1.1)
Prothrombin Time: 73.7 s

## 2016-03-06 MED ORDER — PHYTONADIONE 5 MG PO TABS
2.5000 mg | ORAL_TABLET | Freq: Once | ORAL | Status: AC
  Administered 2016-03-06: 2.5 mg via ORAL

## 2016-03-06 NOTE — Progress Notes (Addendum)
Subjective:    Patient ID: Susan Fitzgerald, female    DOB: 1932-06-17, 82 y.o.   MRN: [id number]  HPI Patient here today for more episodes of irregular Heart rate and SOB. She has brought in her Home BP monitor today and the numbers are very similar to our office machine.The patient is having more episodes of elevated heart rate and shortness of breath. Her blood pressure with our machine and her blood pressure with her machine are very similar. Her diastolic was slightly higher than ours. The pulse rates were similar. On follow-up of previous visits the patient is waiting for an appointment with neurosurgery because of her neck and has an appointment with rheumatology on May 10 because of suspected rheumatoid arthritis. The patient has been using blue emu. I'm not sure if that could've caused her INR to increase or not but she will stop this. She denies any chest pain but has had increased shortness of breath for the past 7-10 days with decreased energy and more shortness of breath with exertion. Her heart rate has been more elevated in the 110 range. She denies any GI symptoms she denies any PND she denies any problems passing her water. She brought in blood pressures for review and some blood pressures get as low as in the 80s over the 60s but the majority of her readings are in the 120/70-80 range.     Patient Active Problem List   Diagnosis Date Noted  . Symptomatic bradycardia 06/02/2013  . Hypertension 04/21/2013  . Osteoporosis 04/21/2013  . Atrial fibrillation, chronic ([place]) 02/09/2013   Outpatient Encounter Prescriptions as of 03/06/2016  Medication Sig  . amLODipine (NORVASC) 5 MG tablet Take 1 tablet by mouth  daily  . calcium-vitamin D (OSCAL WITH D) 250-125 MG-UNIT per tablet Take 1 tablet by mouth 2 (two) times daily.  . diazepam (VALIUM) 5 MG tablet May use 1 tablet prior to MRI  . diclofenac sodium (VOLTAREN) 1 % GEL Apply 2 g topically 4 (four) times daily.  . flecainide  (TAMBOCOR) 150 MG tablet Take one-half tablet by  mouth two times daily  . furosemide (LASIX) 20 MG tablet Take 1 to 2 tablets by  mouth daily as needed  . lisinopril (PRINIVIL,ZESTRIL) 40 MG tablet Take 1 tablet by mouth  daily  . Multiple Vitamin (MULTIVITAMIN) capsule Take 1 capsule by mouth daily.  . pindolol (VISKEN) 5 MG tablet Take 1 tablet (5 mg total) by mouth 2 (two) times daily.  . potassium chloride SA (K-DUR,KLOR-CON) 20 MEQ tablet Take 1 tablet by mouth  daily  . traMADol (ULTRAM) 50 MG tablet Take 0.5-1 tablets (25-50 mg total) by mouth every 8 (eight) hours as needed.  . warfarin (COUMADIN) 5 MG tablet Take 1 tablet by mouth once daily or as directed   No facility-administered encounter medications on file as of 03/06/2016.      Review of Systems  Constitutional: Negative.   HENT: Negative.   Eyes: Negative.   Respiratory: Negative.   Cardiovascular: Negative.        Elevated HR  Gastrointestinal: Negative.   Endocrine: Negative.   Genitourinary: Negative.   Musculoskeletal: Negative.   Skin: Negative.   Allergic/Immunologic: Negative.   Neurological: Positive for light-headedness.  Hematological: Negative.   Psychiatric/Behavioral: Negative.        Objective:   Physical Exam  Constitutional: She is oriented to person, place, and time. She appears well-developed and well-nourished. No distress.  HENT:  Head: Normocephalic and atraumatic.  Eyes: Conjunctivae and EOM are normal. Pupils are equal, round, and reactive to light. Right eye exhibits no discharge. Left eye exhibits no discharge. No scleral icterus.  Neck: Normal range of motion. Neck supple. No thyromegaly present.  Cardiovascular: Normal heart sounds and intact distal pulses.   No murmur heard. The heart is irregular irregular at about 1 20/m  Pulmonary/Chest: Effort normal and breath sounds normal. No respiratory distress. She has no wheezes. She has no rales. She exhibits no tenderness.  Clear  anteriorly and posteriorly  Abdominal: Soft. Bowel sounds are normal. She exhibits no mass. There is no tenderness. There is no rebound and no guarding.  Musculoskeletal: Normal range of motion. She exhibits no edema or tenderness.  The joint swelling at this visit in both hands is diminished compared to what it was previously. As mentioned earlier her rheumatology appointment is pending for May 10.  Lymphadenopathy:    She has no cervical adenopathy.  Neurological: She is alert and oriented to person, place, and time.  Skin: Skin is warm and dry. No rash noted.  Psychiatric: She has a normal mood and affect. Her behavior is normal. Judgment and thought content normal.  The patient is calm but obviously worried about the situation with her heart rate being so fast.  Nursing note and vitals reviewed.  BP 122/71 mmHg  Pulse 110  Temp(Src) 97.7 F (36.5 C) (Oral)  Ht '5\' 2"'  (1.575 m)  Wt 134 lb (60.782 kg)  BMI 24.50 kg/m2  SpO2 98%  EKG: Atrial fibrillation at about 120/m        Assessment & Plan:  1. Atrial fibrillation, chronic (HCC) -We'll discuss the symptoms and findings with her cardiologist for further evaluation and recommendations - CoaguChek XS/INR Waived - EKG 12-Lead - BMP8+EGFR - Thyroid Panel With TSH - Flecainide level  2. Essential hypertension -The blood pressure is good today - EKG 12-Lead - BMP8+EGFR - Thyroid Panel With TSH - Flecainide level  3. Bleeding gums -Patient has had some increased bleedings and has an increased pro time and we will treat with vitamin K - CBC with Differential/Platelet - phytonadione (VITAMIN K) tablet 2.5 mg; Take 0.5 tablets (2.5 mg total) by mouth once.  4. Chronic anticoagulation -The INR is elevated - phytonadione (VITAMIN K) tablet 2.5 mg; Take 0.5 tablets (2.5 mg total) by mouth once.  5. Elevated INR - phytonadione (VITAMIN K) tablet 2.5 mg; Take 0.5 tablets (2.5 mg total) by mouth once. - EKG 12-Lead -  BMP8+EGFR - Thyroid Panel With TSH - Flecainide level  6. Neck pain -An appointment is being made for her to visit with the neurosurgeon because of the findings from the MRI  7. Rheumatoid factor positive -She has an upcoming appointment with the rheumatologist  8. Bilateral hand pain -Rheumatology appointment  9. Other fatigue -Lab work is pending and some of this fatigue may be certainly secondary to her elevated heart rate and atrial fibrillation   Patient Instructions   Anticoagulation Dose Instructions as of 03/06/2016      Dorene Grebe Tue Wed Thu Fri Sat   New Dose 5 mg 5 mg 5 mg 5 mg 5 mg 5 mg 5 mg    Description        Vitamin K given in office today.  No warfarin today or tomorrow.  Recheck INR on Friday, April 21st.      INR was 6.1 today  We will discuss the symptoms and findings with the cardiologist and once  that discussion is had we will call you with his recommendations.    Per Dr [facility] Cardiology = spoke with him regarding pt : Pindololchanged from 5 mg to 7.5 mg BID  Amlodipine reduced to 2.5 daily.  He will order a ECHOcardiogram  He will make sure that Hochrein sees pt soon.   Arrie Senate MD

## 2016-03-06 NOTE — Patient Instructions (Addendum)
Anticoagulation Dose Instructions as of 03/06/2016      Susan Fitzgerald Tue Wed Thu Fri Sat   New Dose 5 mg 5 mg 5 mg 5 mg 5 mg 5 mg 5 mg    Description        Vitamin K given in office today.  No warfarin today or tomorrow.  Recheck INR on Friday, April 21st.      INR was 6.1 today  We will discuss the symptoms and findings with the cardiologist and once that discussion is had we will call you with his recommendations.   Pindolol changed from 5 mg to 7.5 mg BID  Amlodipine reduced to 2.5 daily.  He will order a ECHOcardiogram  He will make sure that Hochrein sees pt soon.

## 2016-03-06 NOTE — Telephone Encounter (Signed)
Discussed case with Dr. Laurance Flatten. Susan Fitzgerald was noted to have worsening shortness of breath and has chronic a-fib, but most recently with RVR - rates in the 120's. I advised increasing pindolol to 7.5 mg BID and decreasing amlodipine to 2.5 mg daily as BP is currently normal. We will obtain an echocardiogram and schedule follow-up with Dr. Percival Spanish in 2 weeks.  Dr. Debara Pickett

## 2016-03-06 NOTE — Telephone Encounter (Signed)
Dr. Laurance Flatten is calling about a Dr.Hochrein Patient , Please call him at [phone]

## 2016-03-07 ENCOUNTER — Telehealth: Payer: Self-pay | Admitting: Cardiology

## 2016-03-07 DIAGNOSIS — I351 Nonrheumatic aortic (valve) insufficiency: Secondary | ICD-10-CM

## 2016-03-07 LAB — CBC WITH DIFFERENTIAL/PLATELET
Basophils Absolute: 0 10*3/uL (ref 0.0–0.2)
Basos: 0 %
EOS (ABSOLUTE): 0.1 10*3/uL (ref 0.0–0.4)
EOS: 2 %
HEMATOCRIT: 37.2 % (ref 34.0–46.6)
HEMOGLOBIN: 12.3 g/dL (ref 11.1–15.9)
IMMATURE GRANULOCYTES: 0 %
Immature Grans (Abs): 0 10*3/uL (ref 0.0–0.1)
LYMPHS: 25 %
Lymphocytes Absolute: 1.7 10*3/uL (ref 0.7–3.1)
MCH: 31.4 pg (ref 26.6–33.0)
MCHC: 33.1 g/dL (ref 31.5–35.7)
MCV: 95 fL (ref 79–97)
MONOCYTES: 10 %
Monocytes Absolute: 0.7 10*3/uL (ref 0.1–0.9)
NEUTROS PCT: 63 %
Neutrophils Absolute: 4.1 10*3/uL (ref 1.4–7.0)
Platelets: 227 10*3/uL (ref 150–379)
RBC: 3.92 x10E6/uL (ref 3.77–5.28)
RDW: 14 % (ref 12.3–15.4)
WBC: 6.6 10*3/uL (ref 3.4–10.8)

## 2016-03-07 LAB — THYROID PANEL WITH TSH
Free Thyroxine Index: 1.9 (ref 1.2–4.9)
T3 UPTAKE RATIO: 29 % (ref 24–39)
T4 TOTAL: 6.5 ug/dL (ref 4.5–12.0)
TSH: 1.93 u[IU]/mL (ref 0.450–4.500)

## 2016-03-07 LAB — BMP8+EGFR
BUN/Creatinine Ratio: 21 (ref 12–28)
BUN: 21 mg/dL (ref 8–27)
CALCIUM: 8.9 mg/dL (ref 8.7–10.3)
CO2: 26 mmol/L (ref 18–29)
CREATININE: 1.02 mg/dL — AB (ref 0.57–1.00)
Chloride: 101 mmol/L (ref 96–106)
GFR calc Af Amer: 60 mL/min/{1.73_m2} (ref >59)
GFR, EST NON AFRICAN AMERICAN: 52 mL/min/{1.73_m2} — AB (ref >59)
GLUCOSE: 98 mg/dL (ref 65–99)
POTASSIUM: 4.3 mmol/L (ref 3.5–5.2)
Sodium: 144 mmol/L (ref 134–144)

## 2016-03-07 LAB — FLECAINIDE LEVEL: FLECAINIDE: 0.64 ug/mL (ref 0.20–1.00)

## 2016-03-07 NOTE — Telephone Encounter (Signed)
Echo ordered.  Staff message send to [facility] scheduling pool to arrange echo and follow up with Dr. Percival Spanish

## 2016-03-07 NOTE — Telephone Encounter (Signed)
Called and left a message with what I believe is her husband to call back and schedule her echo and followup with Dr. Percival Spanish.

## 2016-03-08 ENCOUNTER — Ambulatory Visit (INDEPENDENT_AMBULATORY_CARE_PROVIDER_SITE_OTHER): Payer: Medicare Other | Admitting: Pharmacist

## 2016-03-08 ENCOUNTER — Encounter (INDEPENDENT_AMBULATORY_CARE_PROVIDER_SITE_OTHER): Payer: Self-pay

## 2016-03-08 VITALS — BP 104/68 | HR 100

## 2016-03-08 DIAGNOSIS — I482 Chronic atrial fibrillation, unspecified: Secondary | ICD-10-CM

## 2016-03-08 LAB — COAGUCHEK XS/INR WAIVED
INR: 1.5 — AB (ref 0.9–1.1)
Prothrombin Time: 18.3 s

## 2016-03-08 NOTE — Patient Instructions (Signed)
Anticoagulation Dose Instructions as of 03/08/2016      Susan Fitzgerald Tue Wed Thu Fri Sat   April 21st and 22nd      7.5 mg 5 mg   New Dose 5 mg 2.5 mg 5 mg 5 mg 5 mg 2.5 mg 5 mg    Description        Take 1 and 1/2 tablets today, then change to 1/2 tablet on mondays and fridays.  Take 1 tablet all other days.

## 2016-03-11 ENCOUNTER — Encounter: Payer: Self-pay | Admitting: Pharmacist

## 2016-03-13 ENCOUNTER — Ambulatory Visit (INDEPENDENT_AMBULATORY_CARE_PROVIDER_SITE_OTHER): Payer: Medicare Other | Admitting: Pharmacist

## 2016-03-13 ENCOUNTER — Other Ambulatory Visit: Payer: Self-pay | Admitting: *Deleted

## 2016-03-13 DIAGNOSIS — I482 Chronic atrial fibrillation, unspecified: Secondary | ICD-10-CM

## 2016-03-13 LAB — COAGUCHEK XS/INR WAIVED
INR: 3.2 — ABNORMAL HIGH (ref 0.9–1.1)
Prothrombin Time: 38.8 s

## 2016-03-13 NOTE — Patient Instructions (Signed)
Anticoagulation Dose Instructions as of 03/13/2016      Susan Fitzgerald Tue Wed Thu Fri Sat   New Dose 5 mg 2.5 mg 5 mg 2.5 mg 5 mg 2.5 mg 5 mg    Description        No warfarin today - Wednesday, April 26th.  Then decreased dose to 1/2 tablet mondays, wednesdays and fridays.       INR was 3.2

## 2016-03-14 ENCOUNTER — Telehealth: Payer: Self-pay | Admitting: Cardiology

## 2016-03-14 NOTE — Telephone Encounter (Signed)
Unable to connect when dialed.

## 2016-03-14 NOTE — Telephone Encounter (Signed)
Pt returned my call and her questions were answered. She voiced understanding. Separate concern/question related to billing, a msg was sent to them requesting them to reach out to patient.

## 2016-03-14 NOTE — Telephone Encounter (Signed)
New Message  Pt has echo for 5/1- requested to speak w/r N about what information they can get from an echo.Please call back and discuss.

## 2016-03-14 NOTE — Telephone Encounter (Signed)
Attempted x4 to dial number, no ring notification or signal.

## 2016-03-18 ENCOUNTER — Ambulatory Visit (HOSPITAL_COMMUNITY): Payer: Medicare Other | Attending: Cardiovascular Disease

## 2016-03-18 ENCOUNTER — Other Ambulatory Visit: Payer: Self-pay

## 2016-03-18 ENCOUNTER — Telehealth: Payer: Self-pay | Admitting: Family Medicine

## 2016-03-18 DIAGNOSIS — I351 Nonrheumatic aortic (valve) insufficiency: Secondary | ICD-10-CM | POA: Insufficient documentation

## 2016-03-18 DIAGNOSIS — I071 Rheumatic tricuspid insufficiency: Secondary | ICD-10-CM | POA: Diagnosis not present

## 2016-03-18 DIAGNOSIS — I482 Chronic atrial fibrillation, unspecified: Secondary | ICD-10-CM

## 2016-03-18 DIAGNOSIS — I517 Cardiomegaly: Secondary | ICD-10-CM | POA: Diagnosis not present

## 2016-03-18 DIAGNOSIS — R0600 Dyspnea, unspecified: Secondary | ICD-10-CM | POA: Insufficient documentation

## 2016-03-18 NOTE — Telephone Encounter (Signed)
CD ready for pick up. Patient aware.

## 2016-03-20 ENCOUNTER — Encounter: Payer: Self-pay | Admitting: Pharmacist

## 2016-03-20 ENCOUNTER — Ambulatory Visit (INDEPENDENT_AMBULATORY_CARE_PROVIDER_SITE_OTHER): Payer: Medicare Other | Admitting: Pharmacist

## 2016-03-20 VITALS — BP 142/80 | HR 68

## 2016-03-20 DIAGNOSIS — I482 Chronic atrial fibrillation, unspecified: Secondary | ICD-10-CM

## 2016-03-20 LAB — COAGUCHEK XS/INR WAIVED
INR: 2.1 — ABNORMAL HIGH (ref 0.9–1.1)
PROTHROMBIN TIME: 25.7 s

## 2016-03-20 NOTE — Patient Instructions (Signed)
Anticoagulation Dose Instructions as of 03/20/2016      Susan Fitzgerald Tue Wed Thu Fri Sat   New Dose 5 mg 2.5 mg 5 mg 2.5 mg 5 mg 2.5 mg 5 mg    Description        Continue current dose of warfarin 5mg  - 1/2 tablet mondays, wednesdays and fridays. Take 1 tablet all other days.      INR was 2.1 today

## 2016-03-25 NOTE — Telephone Encounter (Signed)
-----   Message from Minus Breeding, MD sent at 03/21/2016  6:57 AM EDT ----- EF is well preserved.  There is some moderate aortic regurgitation.  No change in therapy.  Follow up in one year.

## 2016-03-25 NOTE — Telephone Encounter (Signed)
Spoke with pt about her Echo, pt voice understanding Echo was ordered for 1 year

## 2016-03-27 DIAGNOSIS — R5383 Other fatigue: Secondary | ICD-10-CM | POA: Diagnosis not present

## 2016-03-27 DIAGNOSIS — M0579 Rheumatoid arthritis with rheumatoid factor of multiple sites without organ or systems involvement: Secondary | ICD-10-CM | POA: Diagnosis not present

## 2016-03-27 DIAGNOSIS — M7989 Other specified soft tissue disorders: Secondary | ICD-10-CM | POA: Diagnosis not present

## 2016-03-27 DIAGNOSIS — M150 Primary generalized (osteo)arthritis: Secondary | ICD-10-CM | POA: Diagnosis not present

## 2016-03-27 DIAGNOSIS — M2550 Pain in unspecified joint: Secondary | ICD-10-CM | POA: Diagnosis not present

## 2016-04-05 ENCOUNTER — Ambulatory Visit (INDEPENDENT_AMBULATORY_CARE_PROVIDER_SITE_OTHER): Payer: Medicare Other | Admitting: Pharmacist

## 2016-04-05 VITALS — BP 150/76 | HR 65

## 2016-04-05 DIAGNOSIS — M5030 Other cervical disc degeneration, unspecified cervical region: Secondary | ICD-10-CM | POA: Diagnosis not present

## 2016-04-05 DIAGNOSIS — M0579 Rheumatoid arthritis with rheumatoid factor of multiple sites without organ or systems involvement: Secondary | ICD-10-CM | POA: Diagnosis not present

## 2016-04-05 DIAGNOSIS — M150 Primary generalized (osteo)arthritis: Secondary | ICD-10-CM | POA: Diagnosis not present

## 2016-04-05 DIAGNOSIS — I482 Chronic atrial fibrillation, unspecified: Secondary | ICD-10-CM

## 2016-04-05 LAB — COAGUCHEK XS/INR WAIVED
INR: 2.5 — ABNORMAL HIGH (ref 0.9–1.1)
Prothrombin Time: 30.1 s

## 2016-04-05 NOTE — Patient Instructions (Signed)
Anticoagulation Dose Instructions as of 04/05/2016      Susan Fitzgerald Tue Wed Thu Fri Sat   New Dose 5 mg 2.5 mg 5 mg 2.5 mg 5 mg 2.5 mg 5 mg    Description        Continue current dose of warfarin 5mg  - 1/2 tablet mondays, wednesdays and fridays. Take 1 tablet all other days.      INR was 2.5 today

## 2016-04-17 NOTE — Progress Notes (Signed)
HPI The patient presents for evaluation of atrial fibrillation. She started noticing increased heart rate under monitor a few weeks ago. She did get an EKG and was found to be in fibrillation. She had her dose pindolol increased. She had her Norvasc reduced. Her heart rate has since been better. She did feel probably a little less energy and some increased palpitations. However, she wasn't having any presyncope or syncope. She wasn't having any chest pressure, neck or arm discomfort. She's otherwise been doing relatively well except for treatment recently with methotrexate for osteoarthritis. Now her heart rate is in the 60s.    Allergies  Allergen Reactions  . Benazepril Hcl Cough    Current Outpatient Prescriptions  Medication Sig Dispense Refill  . acetaminophen (TYLENOL) 500 MG tablet Take 500 mg by mouth as directed.    Marland Kitchen amLODipine (NORVASC) 5 MG tablet Take 5 mg by mouth daily. Pt takes 1/2 tablet daily    . calcium-vitamin D (OSCAL WITH D) 250-125 MG-UNIT per tablet Take 1 tablet by mouth 2 (two) times daily.    . flecainide (TAMBOCOR) 150 MG tablet Take one-half tablet by  mouth two times daily 90 tablet 0  . folic acid (FOLVITE) 1 MG tablet Take 1 mg by mouth daily.    . furosemide (LASIX) 20 MG tablet Take 1 to 2 tablets by  mouth daily as needed 180 tablet 1  . lisinopril (PRINIVIL,ZESTRIL) 40 MG tablet Take 1 tablet by mouth  daily 90 tablet 1  . methotrexate (RHEUMATREX) 2.5 MG tablet Take 1 tablet by mouth as directed.    . Multiple Vitamin (MULTIVITAMIN) capsule Take 1 capsule by mouth daily.    . pindolol (VISKEN) 5 MG tablet Take 5 mg by mouth 2 (two) times daily. Pt takes 7.5 mg two times daily    . potassium chloride SA (K-DUR,KLOR-CON) 20 MEQ tablet Take 1 tablet by mouth  daily 90 tablet 1  . warfarin (COUMADIN) 5 MG tablet Take 1 tablet by mouth once daily or as directed 90 tablet 0   No current facility-administered medications for this visit.    Past Medical  History  Diagnosis Date  . Menopause   . A-fib ([place])     04/09/13-nuclear stress-no ischemia low risk  . Hypertension   . Osteoporosis   . Diverticulosis   . Cataract   . Atrial fibrillation ([place])   . Bradycardia 2014  . Pelvic fracture ([facility])     pelvic belt    Past Surgical History  Procedure Laterality Date  . Cataract extraction    . Biopsy breast      ROS:  .As stated in the HPI and negative for all other systems.  PHYSICAL EXAM BP 138/72 mmHg  Pulse 68  Ht 5\' 2"  (1.575 m)  Wt 132 lb 6.4 oz (60.056 kg)  BMI 24.21 kg/m2  SpO2 96% GENERAL:  Well appearing HEENT:  Pupils equal round and reactive, fundi not visualized, oral mucosa unremarkable NECK:  No jugular venous distention, waveform within normal limits, carotid upstroke brisk and symmetric, no bruits, no thyromegaly LYMPHATICS:  No cervical, inguinal adenopathy LUNGS:  Clear to auscultation bilaterally BACK:  No CVA tenderness CHEST:  Unremarkable HEART:  PMI not displaced or sustained,S1 and S2 within normal limits, no S3, no S4, no clicks, no rubs, no murmurs.  ABD:  Flat, positive bowel sounds normal in frequency in pitch, positive midline bruits, no rebound, no guarding, no midline pulsatile mass, no hepatomegaly, no splenomegaly EXT:  2 plus pulses throughout, no edema, no cyanosis no clubbing, chronic venous stasis changes SKIN:  No rashes no nodules, chronic venous changes.    EKG:  NSR with PACs, rate 73, left axis deviation, left anterior fascicular block, early transition in lead V2, no acute ST-T wave changes.  04/18/2016    ASSESSMENT AND PLAN  HTN:  The blood pressure is at target. No change in medications is indicated. We will continue with therapeutic lifestyle changes (TLC).  ATRIAL FIB:  Ms. RUCHOMA FURY has a CHA2DS2 - VASc score of 4 with a risk of stroke of 4% .  She had some breakthrough atrial fib but appears to be better rate control with the increased Pindolol and now is back in sinus.   She will continue the current therapy.   I reviewed the previous EKG and office records.

## 2016-04-18 ENCOUNTER — Encounter: Payer: Self-pay | Admitting: Cardiology

## 2016-04-18 ENCOUNTER — Ambulatory Visit (INDEPENDENT_AMBULATORY_CARE_PROVIDER_SITE_OTHER): Payer: Medicare Other | Admitting: Cardiology

## 2016-04-18 ENCOUNTER — Ambulatory Visit: Payer: Medicare Other | Admitting: Cardiology

## 2016-04-18 VITALS — BP 138/72 | HR 68 | Ht 62.0 in | Wt 132.4 lb

## 2016-04-18 DIAGNOSIS — I481 Persistent atrial fibrillation: Secondary | ICD-10-CM

## 2016-04-18 DIAGNOSIS — I4819 Other persistent atrial fibrillation: Secondary | ICD-10-CM

## 2016-04-18 DIAGNOSIS — I482 Chronic atrial fibrillation, unspecified: Secondary | ICD-10-CM

## 2016-04-18 NOTE — Patient Instructions (Signed)
Your physician wants you to follow-up in: 6 Months You will receive a reminder letter in the mail two months in advance. If you don't receive a letter, please call our office to schedule the follow-up appointment.  

## 2016-04-22 ENCOUNTER — Ambulatory Visit: Payer: Self-pay | Admitting: Pharmacist

## 2016-04-24 ENCOUNTER — Other Ambulatory Visit: Payer: Self-pay | Admitting: Family

## 2016-04-24 ENCOUNTER — Other Ambulatory Visit: Payer: Self-pay | Admitting: *Deleted

## 2016-04-24 MED ORDER — METHOTREXATE 2.5 MG PO TABS: 10.0000 mg | ORAL_TABLET | ORAL | Status: DC

## 2016-04-24 NOTE — Telephone Encounter (Signed)
dont know instructions?

## 2016-05-01 ENCOUNTER — Ambulatory Visit: Payer: Self-pay | Admitting: Pharmacist

## 2016-05-03 ENCOUNTER — Encounter: Payer: Self-pay | Admitting: Family Medicine

## 2016-05-07 ENCOUNTER — Encounter: Payer: Self-pay | Admitting: Pharmacist

## 2016-05-07 ENCOUNTER — Ambulatory Visit (INDEPENDENT_AMBULATORY_CARE_PROVIDER_SITE_OTHER): Payer: Medicare Other | Admitting: Pharmacist

## 2016-05-07 VITALS — BP 142/80 | HR 68 | Ht 62.0 in | Wt 130.0 lb

## 2016-05-07 DIAGNOSIS — Z0000 Encounter for general adult medical examination without abnormal findings: Secondary | ICD-10-CM

## 2016-05-07 DIAGNOSIS — I482 Chronic atrial fibrillation, unspecified: Secondary | ICD-10-CM

## 2016-05-07 DIAGNOSIS — M810 Age-related osteoporosis without current pathological fracture: Secondary | ICD-10-CM

## 2016-05-07 DIAGNOSIS — M069 Rheumatoid arthritis, unspecified: Secondary | ICD-10-CM | POA: Insufficient documentation

## 2016-05-07 DIAGNOSIS — M05742 Rheumatoid arthritis with rheumatoid factor of left hand without organ or systems involvement: Secondary | ICD-10-CM

## 2016-05-07 DIAGNOSIS — M05741 Rheumatoid arthritis with rheumatoid factor of right hand without organ or systems involvement: Secondary | ICD-10-CM

## 2016-05-07 LAB — COAGUCHEK XS/INR WAIVED
INR: 2.8 — AB (ref 0.9–1.1)
Prothrombin Time: 34 s

## 2016-05-07 NOTE — Patient Instructions (Addendum)
Anticoagulation Dose Instructions as of 05/07/2016      Susan Fitzgerald Tue Wed Thu Fri Sat   New Dose 5 mg 2.5 mg 5 mg 2.5 mg 5 mg 2.5 mg 5 mg    Description        Continue current dose of warfarin 44m - 1/2 tablet mondays, wednesdays and fridays. Take 1 tablet all other days.      INR was 2.8 today   Susan Fitzgerald, Thank you for taking time to come for your Medicare Wellness Visit. I appreciate your ongoing commitment to your health goals. Please review the following plan we discussed and let me know if I can assist you in the future.   These are the goals we discussed:  Continue to stay active - goal is 150 minutes per week as able.   Increase non-starchy vegetables - carrots, green bean, squash, zucchini, tomatoes, onions, peppers, spinach and other green leafy vegetables, cabbage, lettuce, cucumbers, asparagus, okra (not fried), eggplant Limit sugar and processed foods (cakes, cookies, ice cream, crackers and chips) Increase fresh fruit but limit serving sizes 1/2 cup or about the size of tennis or baseball Limit red meat to no more than 1-2 times per week (serving size about the size of your palm) Choose whole grains / lean proteins - whole wheat bread, quinoa, whole grain rice (1/2 cup), fish, chicken, t[place]void sugar and calorie containing beverages - soda, sweet tea and juice.  Choose water or unsweetened tea instead.  For cold symptoms: Cough - can use Delsym or Mucinex DM Nasal Dryness - nasal saline spray or AYR gel Nasal Congestion - nasal decongestant like Afrin but only use for 3 days.    This is a list of the screening recommended for you and due dates:  Health Maintenance  Topic Date Due  . DEXA scan (bone density measurement)  04/20/2016 - sending referral  . Pneumonia vaccines (2 of 2 - PPSV23) Completed  . Flu Shot  06/18/2016  . Mammogram  11/16/2016  . Tetanus Vaccine  08/18/2021  . Shingles Vaccine  Completed  *Topic was postponed. The date shown is not the  original due date.    Health Maintenance, Female Adopting a healthy lifestyle and getting preventive care can go a long way to promote health and wellness. Talk with your health care provider about what schedule of regular examinations is right for you. This is a good chance for you to check in with your provider about disease prevention and staying healthy. In between checkups, there are plenty of things you can do on your own. Experts have done a lot of research about which lifestyle changes and preventive measures are most likely to keep you healthy. Ask your health care provider for more information. WEIGHT AND DIET  Eat a healthy diet  Be sure to include plenty of vegetables, fruits, low-fat dairy products, and lean protein.  Do not eat a lot of foods high in solid fats, added sugars, or salt.  Get regular exercise. This is one of the most important things you can do for your health.  Most adults should exercise for at least 150 minutes each week. The exercise should increase your heart rate and make you sweat (moderate-intensity exercise).  Most adults should also do strengthening exercises at least twice a week. This is in addition to the moderate-intensity exercise.  Maintain a healthy weight  Body mass index (BMI) is a measurement that can be used to identify possible weight problems. It  estimates body fat based on height and weight. Your health care provider can help determine your BMI and help you achieve or maintain a healthy weight.  For females 18 years of age and older:   A BMI below 18.5 is considered underweight.  A BMI of 18.5 to 24.9 is normal.  A BMI of 25 to 29.9 is considered overweight.  A BMI of 30 and above is considered obese.  Watch levels of cholesterol and blood lipids  You should start having your blood tested for lipids and cholesterol at 80 years of age, then have this test every 5 years.  You may need to have your cholesterol levels checked more  often if:  Your lipid or cholesterol levels are high.  You are older than 80 years of age.  You are at high risk for heart disease.  CANCER SCREENING   Lung Cancer  Lung cancer screening is recommended for adults 59-49 years old who are at high risk for lung cancer because of a history of smoking.  A yearly low-dose CT scan of the lungs is recommended for people who:  Currently smoke.  Have quit within the past 15 years.  Have at least a 30-pack-year history of smoking. A pack year is smoking an average of one pack of cigarettes a day for 1 year.  Yearly screening should continue until it has been 15 years since you quit.  Yearly screening should stop if you develop a health problem that would prevent you from having lung cancer treatment.  Breast Cancer  Practice breast self-awareness. This means understanding how your breasts normally appear and feel.  It also means doing regular breast self-exams. Let your health care provider know about any changes, no matter how small.  If you are in your 20s or 30s, you should have a clinical breast exam (CBE) by a health care provider every 1-3 years as part of a regular health exam.  If you are 21 or older, have a CBE every year. Also consider having a breast X-ray (mammogram) every year.  If you have a family history of breast cancer, talk to your health care provider about genetic screening.  If you are at high risk for breast cancer, talk to your health care provider about having an MRI and a mammogram every year.  Breast cancer gene (BRCA) assessment is recommended for women who have family members with BRCA-related cancers. BRCA-related cancers include:  Breast.  Ovarian.  Tubal.  Peritoneal cancers.  Results of the assessment will determine the need for genetic counseling and BRCA1 and BRCA2 testing. Cervical Cancer Your health care provider may recommend that you be screened regularly for cancer of the pelvic organs  (ovaries, uterus, and vagina). This screening involves a pelvic examination, including checking for microscopic changes to the surface of your cervix (Pap test). You may be encouraged to have this screening done every 3 years, beginning at age 62.  For women ages 56-65, health care providers may recommend pelvic exams and Pap testing every 3 years, or they may recommend the Pap and pelvic exam, combined with testing for human papilloma virus (HPV), every 5 years. Some types of HPV increase your risk of cervical cancer. Testing for HPV may also be done on women of any age with unclear Pap test results.  Other health care providers may not recommend any screening for nonpregnant women who are considered low risk for pelvic cancer and who do not have symptoms. Ask your health care provider if  a screening pelvic exam is right for you.  If you have had past treatment for cervical cancer or a condition that could lead to cancer, you need Pap tests and screening for cancer for at least 20 years after your treatment. If Pap tests have been discontinued, your risk factors (such as having a new sexual partner) need to be reassessed to determine if screening should resume. Some women have medical problems that increase the chance of getting cervical cancer. In these cases, your health care provider may recommend more frequent screening and Pap tests. Colorectal Cancer  This type of cancer can be detected and often prevented.  Routine colorectal cancer screening usually begins at 80 years of age and continues through 80 years of age.  Your health care provider may recommend screening at an earlier age if you have risk factors for colon cancer.  Your health care provider may also recommend using home test kits to check for hidden blood in the stool.  A small camera at the end of a tube can be used to examine your colon directly (sigmoidoscopy or colonoscopy). This is done to check for the earliest forms of  colorectal cancer.  Routine screening usually begins at age 31.  Direct examination of the colon should be repeated every 5-10 years through 80 years of age. However, you may need to be screened more often if early forms of precancerous polyps or small growths are found. Skin Cancer  Check your skin from head to toe regularly.  Tell your health care provider about any new moles or changes in moles, especially if there is a change in a mole's shape or color.  Also tell your health care provider if you have a mole that is larger than the size of a pencil eraser.  Always use sunscreen. Apply sunscreen liberally and repeatedly throughout the day.  Protect yourself by wearing long sleeves, pants, a wide-brimmed hat, and sunglasses whenever you are outside. HEART DISEASE, DIABETES, AND HIGH BLOOD PRESSURE   High blood pressure causes heart disease and increases the risk of stroke. High blood pressure is more likely to develop in:  People who have blood pressure in the high end of the normal range (130-139/85-89 mm Hg).  People who are overweight or obese.  People who are African American.  If you are 4-11 years of age, have your blood pressure checked every 3-5 years. If you are 59 years of age or older, have your blood pressure checked every year. You should have your blood pressure measured twice--once when you are at a hospital or clinic, and once when you are not at a hospital or clinic. Record the average of the two measurements. To check your blood pressure when you are not at a hospital or clinic, you can use:  An automated blood pressure machine at a pharmacy.  A home blood pressure monitor.  If you are between 56 years and 58 years old, ask your health care provider if you should take aspirin to prevent strokes.  Have regular diabetes screenings. This involves taking a blood sample to check your fasting blood sugar level.  If you are at a normal weight and have a low risk for  diabetes, have this test once every three years after 80 years of age.  If you are overweight and have a high risk for diabetes, consider being tested at a younger age or more often. PREVENTING INFECTION  Hepatitis B  If you have a higher risk for hepatitis B,  you should be screened for this virus. You are considered at high risk for hepatitis B if:  You were born in a country where hepatitis B is common. Ask your health care provider which countries are considered high risk.  Your parents were born in a high-risk country, and you have not been immunized against hepatitis B (hepatitis B vaccine).  You have HIV or AIDS.  You use needles to inject street drugs.  You live with someone who has hepatitis B.  You have had sex with someone who has hepatitis B.  You get hemodialysis treatment.  You take certain medicines for conditions, including cancer, organ transplantation, and autoimmune conditions. Hepatitis C  Blood testing is recommended for:  Everyone born from [birth year] through 1963.  Anyone with known risk factors for hepatitis C. Sexually transmitted infections (STIs)  You should be screened for sexually transmitted infections (STIs) including gonorrhea and chlamydia if:  You are sexually active and are younger than 80 years of age.  You are older than 80 years of age and your health care provider tells you that you are at risk for this type of infection.  Your sexual activity has changed since you were last screened and you are at an increased risk for chlamydia or gonorrhea. Ask your health care provider if you are at risk.  If you do not have HIV, but are at risk, it may be recommended that you take a prescription medicine daily to prevent HIV infection. This is called pre-exposure prophylaxis (PrEP). You are considered at risk if:  You are sexually active and do not regularly use condoms or know the HIV status of your partner(s).  You take drugs by injection.  You are  sexually active with a partner who has HIV. Talk with your health care provider about whether you are at high risk of being infected with HIV. If you choose to begin PrEP, you should first be tested for HIV. You should then be tested every 3 months for as long as you are taking PrEP.  PREGNANCY   If you are premenopausal and you may become pregnant, ask your health care provider about preconception counseling.  If you may become pregnant, take 400 to 800 micrograms (mcg) of folic acid every day.  If you want to prevent pregnancy, talk to your health care provider about birth control (contraception). OSTEOPOROSIS AND MENOPAUSE   Osteoporosis is a disease in which the bones lose minerals and strength with aging. This can result in serious bone fractures. Your risk for osteoporosis can be identified using a bone density scan.  If you are 47 years of age or older, or if you are at risk for osteoporosis and fractures, ask your health care provider if you should be screened.  Ask your health care provider whether you should take a calcium or vitamin D supplement to lower your risk for osteoporosis.  Menopause may have certain physical symptoms and risks.  Hormone replacement therapy may reduce some of these symptoms and risks. Talk to your health care provider about whether hormone replacement therapy is right for you.  HOME CARE INSTRUCTIONS   Schedule regular health, dental, and eye exams.  Stay current with your immunizations.   Do not use any tobacco products including cigarettes, chewing tobacco, or electronic cigarettes.  If you are pregnant, do not drink alcohol.  If you are breastfeeding, limit how much and how often you drink alcohol.  Limit alcohol intake to no more than 1 drink per day  for nonpregnant women. One drink equals 12 ounces of beer, 5 ounces of wine, or 1 ounces of hard liquor.  Do not use street drugs.  Do not share needles.  Ask your health care provider for  help if you need support or information about quitting drugs.  Tell your health care provider if you often feel depressed.  Tell your health care provider if you have ever been abused or do not feel safe at home.   This information is not intended to replace advice given to you by your health care provider. Make sure you discuss any questions you have with your health care provider.   Document Released: 05/20/2011 Document Revised: 11/25/2014 Document Reviewed: 10/06/2013 Elsevier Interactive Patient Education [employer].

## 2016-05-07 NOTE — Progress Notes (Signed)
Patient ID: Susan Fitzgerald, female   DOB: 04/27/1932, 82 y.o.   MRN: [id number]    Subjective:   Susan Fitzgerald is a 82 y.o. female who presents for a subsequent Medicare Annual Wellness Visit and to have INR rechecked.  She has recently started methotrexate 10mg  weekly for rheumatoid arthritis. She is also taking folic acid 1mg  daily.  Reports improvement in joint pain over last 2 weeks.   Review of Systems  Review of Systems  Constitutional: Positive for malaise/fatigue (related to head cold). Negative for fever, chills, weight loss and diaphoresis.  HENT: Positive for congestion (nasal congestion for 2-3 days). Negative for nosebleeds and sore throat.   Eyes: Negative.   Respiratory: Positive for cough (for 2-3 days - improving per patient). Negative for hemoptysis, sputum production, shortness of breath and wheezing.   Cardiovascular: Negative.   Gastrointestinal: Negative.   Genitourinary: Negative.   Musculoskeletal: Positive for joint pain.  Skin: Negative.   Neurological: Negative.  Negative for weakness and headaches.  Endo/Heme/Allergies: Negative.   Psychiatric/Behavioral: Negative.      Current Medications (verified) Outpatient Encounter Prescriptions as of 05/07/2016  Medication Sig  . acetaminophen (TYLENOL) 500 MG tablet Take 500 mg by mouth as directed.  Marland Kitchen amLODipine (NORVASC) 5 MG tablet Take 5 mg by mouth daily. Pt takes 1/2 tablet daily  . calcium-vitamin D (OSCAL WITH D) 250-125 MG-UNIT per tablet Take 1 tablet by mouth 2 (two) times daily.  . flecainide (TAMBOCOR) 150 MG tablet Take one-half tablet by  mouth two times daily  . folic acid (FOLVITE) 1 MG tablet Take 1 mg by mouth daily.  . furosemide (LASIX) 20 MG tablet Take 1 to 2 tablets by  mouth daily as needed  . lisinopril (PRINIVIL,ZESTRIL) 40 MG tablet Take 1 tablet by mouth  daily  . methotrexate (RHEUMATREX) 2.5 MG tablet Take 4 tablets (10 mg total) by mouth once a week.  . Multiple Vitamin  (MULTIVITAMIN) capsule Take 1 capsule by mouth daily.  . pindolol (VISKEN) 5 MG tablet take 1 and 1/2 tablet bid  . potassium chloride SA (K-DUR,KLOR-CON) 20 MEQ tablet Take 1 tablet by mouth  daily  . warfarin (COUMADIN) 5 MG tablet Take 1 tablet by mouth once daily or as directed  . [DISCONTINUED] pindolol (VISKEN) 5 MG tablet Take 1 tablet by mouth two  times daily (Patient taking differently: take 1 and 1/2 tablet bid)   No facility-administered encounter medications on file as of 05/07/2016.    Allergies (verified) Benazepril hcl   History: Past Medical History  Diagnosis Date  . Menopause   . A-fib ([place])     04/09/13-nuclear stress-no ischemia low risk  . Hypertension   . Osteoporosis   . Diverticulosis   . Cataract   . Atrial fibrillation ([place])   . Bradycardia 2014  . Pelvic fracture ([place])   . Arthritis    Past Surgical History  Procedure Laterality Date  . Cataract extraction    . Biopsy breast    . Eye surgery     Family History  Problem Relation Age of Onset  . Stroke Mother     cerebral hemorrhage  . Heart disease Father     MI  . Heart attack Father   . Vision loss Father   . Heart disease Brother   . Heart attack Brother   . Cancer Maternal Aunt     breast  . Cancer Brother   . Heart disease Brother   . Cancer  Daughter     breast   Social History   Occupational History  . Not on file.   Social History Main Topics  . Smoking status: Never Smoker   . Smokeless tobacco: Never Used  . Alcohol Use: No  . Drug Use: No  . Sexual Activity: Yes    Do you feel safe at home?  No Are there smokers in your home (other than you)? No  Dietary issues and exercise activities: Current Exercise Habits: Home exercise routine, Type of exercise: Other - see comments, Time (Minutes): 60, Frequency (Times/Week): 1, Weekly Exercise (Minutes/Week): 60, Intensity: Mild, Exercise limited by: orthopedic condition(s) (hip pain has limited her walking)  Current  Dietary habits:  Tried to limit salt intake, no fried foods, eats lots of vegetables and fruits.   Objective:    Today's Vitals   05/07/16 0837  BP: 142/80  Pulse: 68  Height: 5\' 2"  (1.575 m)  Weight: 130 lb (58.968 kg)  PainSc: 2   PainLoc: Hip   Body mass index is 23.77 kg/(m^2).   INR was 2.8 in office today  Activities of Daily Living In your present state of health, do you have any difficulty performing the following activities: 05/07/2016  Hearing? N  Vision? N  Difficulty concentrating or making decisions? N  Walking or climbing stairs? N  Dressing or bathing? N  Doing errands, shopping? N  Preparing Food and eating ? N  Using the Toilet? N  In the past six months, have you accidently leaked urine? N  Do you have problems with loss of bowel control? N  Managing your Medications? N  Managing your Finances? N  Housekeeping or managing your Housekeeping? N     Cardiac Risk Factors include: advanced age (>34men, >56 women);family history of premature cardiovascular disease;hypertension  Depression Screen PHQ 2/9 Scores 05/07/2016 03/06/2016 02/15/2016 01/01/2016  PHQ - 2 Score 0 0 0 0  PHQ- 9 Score 4 - - -     Fall Risk Fall Risk  05/07/2016 03/06/2016 02/15/2016 01/01/2016 07/17/2015  Falls in the past year? No No No No No  Risk for fall due to : - - - - -  Risk for fall due to (comments): - - - - -    Cognitive Function: MMSE - Mini Mental State Exam 05/07/2016 04/10/2015  Orientation to time 4 5  Orientation to Place 5 5  Registration 3 3  Attention/ Calculation 4 5  Recall 3 3  Language- name 2 objects 2 2  Language- repeat 1 1  Language- follow 3 step command 1 3  Language- read & follow direction 1 1  Write a sentence 1 1  Copy design - 1  Total score - 30    Immunizations and Health Maintenance Immunization History  Administered Date(s) Administered  . Influenza,inj,Quad PF,36+ Mos 08/23/2013, 09/05/2014, 08/28/2015  . Pneumococcal Conjugate-13  09/05/2014   Health Maintenance Due  Topic Date Due  . DEXA SCAN  04/20/2016    Patient Care Team: Chipper Herb, MD as PCP - General (Family Medicine) Minus Breeding, MD as Consulting Physician (Cardiology) Druscilla Brownie, MD as Consulting Physician (Dermatology) Hennie Duos, MD as Consulting Physician (Rheumatology)  Indicate any recent Medical Services you may have received from other than [facility] providers in the past year (date may be approximate).    Assessment:    Annual Wellness Visit    Screening Tests Health Maintenance  Topic Date Due  . DEXA SCAN  04/20/2016  . PNA  vac Low Risk Adult (2 of 2 - PPSV23) 06/30/2016 (Originally 09/06/2015)  . INFLUENZA VACCINE  06/18/2016  . MAMMOGRAM  11/16/2016  . TETANUS/TDAP  08/18/2021  . ZOSTAVAX  Completed        Plan:   During the course of the visit Susan Fitzgerald was educated and counseled about the following appropriate screening and preventive services:   Vaccines to include Pneumoccal, Influenza, Td, Zostavax - UTD   Colorectal cancer screening - UTD  Cardiovascular disease screening - UTD, sees Dr Jenkins [place]  Diabetes screening - last FBG was WNL  Bone Denisty / Osteoporosis Screening - ordered today (our DEXA is being update so they will add her to list to call when update is complete)  Mammogram - UTD  PAP - no longer required  Glaucoma screening /  Eye Exam - UTD  Nutrition counseling - continue to eat a variety of fruits and vegetables, lean proteins.  Weigh is controlled  Advanced Directives - UTD  Physical Activity - continue to exercise as able - great job with golfing weekly  Continue to check BP at home - was slightly elevated today but last visit was at goal.   For cold symptoms:  Cough - can use Delsym or Mucinex DM  Nasal Dryness - nasal saline spray or AYR gel  Nasal Congestion - nasal decongestant like Afrin but  only use for 3 days.  Anticoagulation Dose Instructions as of 05/07/2016       Dorene Grebe Tue Wed Thu Fri Sat   New Dose 5 mg 2.5 mg 5 mg 2.5 mg 5 mg 2.5 mg 5 mg    Description        Continue current dose of warfarin 5mg  - 1/2 tablet mondays, wednesdays and fridays. Take 1 tablet all other days.

## 2016-05-17 DIAGNOSIS — M0579 Rheumatoid arthritis with rheumatoid factor of multiple sites without organ or systems involvement: Secondary | ICD-10-CM | POA: Diagnosis not present

## 2016-05-17 DIAGNOSIS — M150 Primary generalized (osteo)arthritis: Secondary | ICD-10-CM | POA: Diagnosis not present

## 2016-05-17 DIAGNOSIS — M5030 Other cervical disc degeneration, unspecified cervical region: Secondary | ICD-10-CM | POA: Diagnosis not present

## 2016-05-23 ENCOUNTER — Other Ambulatory Visit: Payer: Self-pay | Admitting: *Deleted

## 2016-05-23 MED ORDER — PINDOLOL 5 MG PO TABS: ORAL_TABLET | ORAL | Status: DC

## 2016-06-04 ENCOUNTER — Other Ambulatory Visit: Payer: Self-pay | Admitting: Family Medicine

## 2016-06-17 ENCOUNTER — Ambulatory Visit (INDEPENDENT_AMBULATORY_CARE_PROVIDER_SITE_OTHER): Payer: Medicare Other | Admitting: Pharmacist

## 2016-06-17 DIAGNOSIS — I4891 Unspecified atrial fibrillation: Secondary | ICD-10-CM | POA: Diagnosis not present

## 2016-06-17 DIAGNOSIS — I482 Chronic atrial fibrillation, unspecified: Secondary | ICD-10-CM

## 2016-06-17 LAB — COAGUCHEK XS/INR WAIVED
INR: 1.8 — ABNORMAL HIGH (ref 0.9–1.1)
Prothrombin Time: 21.2 s

## 2016-06-24 ENCOUNTER — Encounter: Payer: Self-pay | Admitting: Family Medicine

## 2016-06-24 ENCOUNTER — Ambulatory Visit (INDEPENDENT_AMBULATORY_CARE_PROVIDER_SITE_OTHER): Payer: Medicare Other

## 2016-06-24 ENCOUNTER — Ambulatory Visit (INDEPENDENT_AMBULATORY_CARE_PROVIDER_SITE_OTHER): Payer: Medicare Other | Admitting: Family Medicine

## 2016-06-24 VITALS — BP 157/78 | HR 60 | Temp 98.1°F | Ht 62.0 in | Wt 131.5 lb

## 2016-06-24 DIAGNOSIS — M25562 Pain in left knee: Secondary | ICD-10-CM

## 2016-06-24 DIAGNOSIS — M129 Arthropathy, unspecified: Secondary | ICD-10-CM

## 2016-06-24 DIAGNOSIS — M1712 Unilateral primary osteoarthritis, left knee: Secondary | ICD-10-CM

## 2016-06-24 MED ORDER — METHYLPREDNISOLONE ACETATE 80 MG/ML IJ SUSP: 80.0000 mg | Freq: Once | INTRAMUSCULAR | Status: DC

## 2016-06-24 NOTE — Progress Notes (Signed)
BP (!) 176/81   Pulse 60   Temp 98.1 F (36.7 C) (Oral)   Ht 5\' 2"  (1.575 m)   Wt 131 lb 8 oz (59.6 kg)   BMI 24.05 kg/m    Subjective:    Patient ID: Susan Fitzgerald, female    DOB: [birth year], 82 y.o.   MRN: [id number]  HPI: Susan Fitzgerald is a 82 y.o. female presenting on 06/24/2016 for right knee pain   HPI Left knee pain Patient has been having left anterior medial knee pain that has been worsening over the past few days. She has had it previously but has become more significant over the past few days to where she is even having difficulty walking. She has swelling in that left knee. She has both history of a arthritis and rheumatoid arthritis so there may be multiple factorials playing into this scenario. She is on Coumadin so she can't take oral NSAIDs. The pain today is rated as a 6 out of 10 which is worse than she's had it before. She does not recall any specific trauma or event that could've caused any kind of fracture. No fevers or chills or overlying skin changes or warmth.  Relevant past medical, surgical, family and social history reviewed and updated as indicated. Interim medical history since our last visit reviewed. Allergies and medications reviewed and updated.  Review of Systems  Constitutional: Negative for chills and fever.  HENT: Negative for congestion, ear discharge and ear pain.   Eyes: Negative for redness and visual disturbance.  Respiratory: Negative for chest tightness and shortness of breath.   Cardiovascular: Negative for chest pain and leg swelling.  Genitourinary: Negative for difficulty urinating and dysuria.  Musculoskeletal: Positive for arthralgias, gait problem and joint swelling. Negative for back pain.  Skin: Negative for color change and rash.  Neurological: Negative for light-headedness and headaches.  Psychiatric/Behavioral: Negative for agitation and behavioral problems.  All other systems reviewed and are negative.   Per HPI unless  specifically indicated above     Medication List       Accurate as of 06/24/16 10:47 AM. Always use your most recent med list.          acetaminophen 500 MG tablet Commonly known as:  TYLENOL Take 500 mg by mouth as directed.   amLODipine 5 MG tablet Commonly known as:  NORVASC Take 5 mg by mouth daily. Pt takes 1/2 tablet daily   calcium-vitamin D 250-125 MG-UNIT tablet Commonly known as:  OSCAL WITH D Take 1 tablet by mouth 2 (two) times daily.   flecainide 150 MG tablet Commonly known as:  TAMBOCOR Take one-half tablet by  mouth two times daily   folic acid 1 MG tablet Commonly known as:  FOLVITE Take 1 mg by mouth daily.   furosemide 20 MG tablet Commonly known as:  LASIX Take 1 to 2 tablets by  mouth daily as needed   lisinopril 40 MG tablet Commonly known as:  PRINIVIL,ZESTRIL Take 1 tablet by mouth  daily   methotrexate 2.5 MG tablet Commonly known as:  RHEUMATREX Take 4 tablets (10 mg total) by mouth once a week.   multivitamin capsule Take 1 capsule by mouth daily.   pindolol 5 MG tablet Commonly known as:  VISKEN take 1 and 1/2 tablet bid   potassium chloride SA 20 MEQ tablet Commonly known as:  K-DUR,KLOR-CON Take 1 tablet by mouth  daily   warfarin 5 MG tablet Commonly known as:  COUMADIN  Take 1 tablet by mouth once daily or as directed          Objective:    BP (!) 176/81   Pulse 60   Temp 98.1 F (36.7 C) (Oral)   Ht 5\' 2"  (1.575 m)   Wt 131 lb 8 oz (59.6 kg)   BMI 24.05 kg/m   Wt Readings from Last 3 Encounters:  06/24/16 131 lb 8 oz (59.6 kg)  05/07/16 130 lb (59 kg)  04/18/16 132 lb 6.4 oz (60.1 kg)    Physical Exam  Constitutional: She is oriented to person, place, and time. She appears well-developed and well-nourished. No distress.  Eyes: Conjunctivae and EOM are normal. Pupils are equal, round, and reactive to light.  Cardiovascular: Normal rate, regular rhythm, normal heart sounds and intact distal pulses.   No  murmur heard. Pulmonary/Chest: Effort normal and breath sounds normal. No respiratory distress. She has no wheezes.  Musculoskeletal: Normal range of motion. She exhibits no edema.       Left knee: She exhibits swelling and effusion. She exhibits normal range of motion, no ecchymosis, no deformity, no erythema, normal alignment, no LCL laxity, normal patellar mobility, normal meniscus and no MCL laxity. Tenderness found. Medial joint line tenderness noted.  Neurological: She is alert and oriented to person, place, and time. Coordination normal.  Skin: Skin is warm and dry. No rash noted. She is not diaphoretic.  Psychiatric: She has a normal mood and affect. Her behavior is normal.  Nursing note and vitals reviewed.  Knee injection: Risk factors of bleeding and infection discussed with patient and patient is agreeable towards injection. Patient prepped with Betadine. Lateral approach towards injection used. Injected 80 mg of Depo-Medrol and 1 mL of 2% lidocaine. Patient tolerated procedure well and no side effects from noted. Minimal to no bleeding. Simple bandage applied after.    Assessment & Plan:   Problem List Items Addressed This Visit    None    Visit Diagnoses    Arthritis of left knee    -  Primary   Patient has signs of both rheumatoid arthritis and possible a arthritis, we'll do steroid injection   Relevant Medications   methylPREDNISolone acetate (DEPO-MEDROL) injection 80 mg (Start on 06/24/2016 11:00 AM)   Other Relevant Orders   DG Knee 1-2 Views Left       Follow up plan: Return if symptoms worsen or fail to improve.  Counseling provided for all of the vaccine components Orders Placed This Encounter  Procedures  . DG Knee 1-2 Views Left    Caryl Pina, MD [facility] 06/24/2016, 10:47 AM

## 2016-06-27 ENCOUNTER — Other Ambulatory Visit: Payer: Self-pay | Admitting: *Deleted

## 2016-06-27 ENCOUNTER — Telehealth: Payer: Self-pay | Admitting: Family Medicine

## 2016-06-27 DIAGNOSIS — M1712 Unilateral primary osteoarthritis, left knee: Secondary | ICD-10-CM

## 2016-06-27 MED ORDER — DICLOFENAC SODIUM 1 % TD GEL: 2.0000 g | Freq: Four times a day (QID) | TRANSDERMAL | 2 refills | Status: DC

## 2016-06-27 NOTE — Telephone Encounter (Signed)
Spoke with pt regarding knee pain Pain is no better Any recommendation? She has a golf trip on Monday and really wants some relief before trip. Please review and advise

## 2016-06-27 NOTE — Telephone Encounter (Signed)
Spoke with pt regarding Voltaren gel Okay for pt to try per Dr Dettinger and Tammy Pt verbalizes understanding RX sent into [facility]

## 2016-06-27 NOTE — Telephone Encounter (Signed)
Unfortunately if the injection did not work I really do not have anything else for her at this point. She can try anti-inflammatories such as Advil or Aleve and go up to the top dose and see if that'll help and she can also alternate in Tylenol and see if that can control the pain but I was hoping the injection would give her more relief. If the injections continue to not give her relief then she may be getting to the point where she needs to go back and see the orthopedic surgeon

## 2016-07-01 ENCOUNTER — Ambulatory Visit: Payer: Medicare Other | Admitting: Family Medicine

## 2016-07-04 ENCOUNTER — Telehealth: Payer: Self-pay | Admitting: Pharmacist

## 2016-07-04 ENCOUNTER — Ambulatory Visit (INDEPENDENT_AMBULATORY_CARE_PROVIDER_SITE_OTHER): Payer: Medicare Other | Admitting: Pharmacist

## 2016-07-04 DIAGNOSIS — I482 Chronic atrial fibrillation, unspecified: Secondary | ICD-10-CM

## 2016-07-04 DIAGNOSIS — Z7901 Long term (current) use of anticoagulants: Secondary | ICD-10-CM | POA: Diagnosis not present

## 2016-07-04 LAB — COAGUCHEK XS/INR WAIVED
INR: 2.3 — AB (ref 0.9–1.1)
PROTHROMBIN TIME: 27.7 s

## 2016-07-04 NOTE — Progress Notes (Signed)
See anticoag notes

## 2016-07-04 NOTE — Progress Notes (Signed)
Agree with calling neurosurgery

## 2016-07-04 NOTE — Patient Instructions (Addendum)
I will send note to Dr Laurance Flatten regarding pain in right leg and left knee.  I would recommend that you contact neurosurgeon Dr Ashok Pall that you previously has appointment with and reschedule [phone] Or consider appointment with Dr Lyla Glassing with [facility] for evaluation of knee [phone].

## 2016-07-04 NOTE — Telephone Encounter (Signed)
Advised patient at appt today to consider rescheduling appt with neurosurgeon or orthopedist regarding leg/ knee pain.  Phone numbers for both Dr Christella Noa and Dr Lyla Glassing given. Will forward to her PCP to see if other recommendations

## 2016-07-04 NOTE — Telephone Encounter (Signed)
I agree with recommendation.

## 2016-07-05 ENCOUNTER — Telehealth: Payer: Self-pay | Admitting: Family Medicine

## 2016-07-05 DIAGNOSIS — M79604 Pain in right leg: Secondary | ICD-10-CM

## 2016-07-05 DIAGNOSIS — R200 Anesthesia of skin: Secondary | ICD-10-CM

## 2016-07-09 NOTE — Telephone Encounter (Signed)
Pt went to see Rheumatologist in May not Dr. Christella Noa, would now like to see Dr. Christella Noa but not for original reason but for her leg problems that her & Dr. Laurance Flatten have discussed. She already has an appt set with Dr. Christella Noa for Sept 5th. The referral & notes need to be set up and sent.

## 2016-07-09 NOTE — Telephone Encounter (Signed)
Spoke with pt and referral placed

## 2016-07-11 ENCOUNTER — Other Ambulatory Visit: Payer: Self-pay | Admitting: Family Medicine

## 2016-07-16 DIAGNOSIS — M150 Primary generalized (osteo)arthritis: Secondary | ICD-10-CM | POA: Diagnosis not present

## 2016-07-16 DIAGNOSIS — M5030 Other cervical disc degeneration, unspecified cervical region: Secondary | ICD-10-CM | POA: Diagnosis not present

## 2016-07-16 DIAGNOSIS — M0579 Rheumatoid arthritis with rheumatoid factor of multiple sites without organ or systems involvement: Secondary | ICD-10-CM | POA: Diagnosis not present

## 2016-07-20 ENCOUNTER — Other Ambulatory Visit: Payer: Self-pay | Admitting: Family Medicine

## 2016-07-23 DIAGNOSIS — M5416 Radiculopathy, lumbar region: Secondary | ICD-10-CM | POA: Diagnosis not present

## 2016-07-23 DIAGNOSIS — I10 Essential (primary) hypertension: Secondary | ICD-10-CM | POA: Diagnosis not present

## 2016-07-24 ENCOUNTER — Telehealth: Payer: Self-pay | Admitting: Family Medicine

## 2016-07-24 ENCOUNTER — Encounter: Payer: Self-pay | Admitting: Pharmacist

## 2016-07-24 NOTE — Telephone Encounter (Signed)
Recommend check INR next week instead of waiting until 08/03/16.  Please call patient and make appt

## 2016-07-24 NOTE — Telephone Encounter (Signed)
Appointment made 07/30/16 at [facility].

## 2016-07-30 ENCOUNTER — Ambulatory Visit (INDEPENDENT_AMBULATORY_CARE_PROVIDER_SITE_OTHER): Payer: Medicare Other

## 2016-07-30 ENCOUNTER — Ambulatory Visit (INDEPENDENT_AMBULATORY_CARE_PROVIDER_SITE_OTHER): Payer: Medicare Other | Admitting: Pharmacist

## 2016-07-30 DIAGNOSIS — I482 Chronic atrial fibrillation, unspecified: Secondary | ICD-10-CM

## 2016-07-30 DIAGNOSIS — M810 Age-related osteoporosis without current pathological fracture: Secondary | ICD-10-CM

## 2016-07-30 LAB — COAGUCHEK XS/INR WAIVED
INR: 2.4 — AB (ref 0.9–1.1)
PROTHROMBIN TIME: 28.2 s

## 2016-08-05 ENCOUNTER — Encounter: Payer: Self-pay | Admitting: Pharmacist

## 2016-08-14 ENCOUNTER — Other Ambulatory Visit: Payer: Self-pay | Admitting: Neurosurgery

## 2016-08-14 DIAGNOSIS — M5416 Radiculopathy, lumbar region: Secondary | ICD-10-CM

## 2016-08-17 ENCOUNTER — Other Ambulatory Visit: Payer: Medicare Other

## 2016-08-19 ENCOUNTER — Ambulatory Visit
Admission: RE | Admit: 2016-08-19 | Discharge: 2016-08-19 | Disposition: A | Discharge status: Home / Self Care | Payer: Medicare Other | Source: Ambulatory Visit | Attending: Neurosurgery | Admitting: Neurosurgery

## 2016-08-19 DIAGNOSIS — M150 Primary generalized (osteo)arthritis: Secondary | ICD-10-CM | POA: Diagnosis not present

## 2016-08-19 DIAGNOSIS — M25562 Pain in left knee: Secondary | ICD-10-CM | POA: Diagnosis not present

## 2016-08-19 DIAGNOSIS — M5416 Radiculopathy, lumbar region: Secondary | ICD-10-CM

## 2016-08-19 DIAGNOSIS — M0579 Rheumatoid arthritis with rheumatoid factor of multiple sites without organ or systems involvement: Secondary | ICD-10-CM | POA: Diagnosis not present

## 2016-08-19 DIAGNOSIS — M48061 Spinal stenosis, lumbar region without neurogenic claudication: Secondary | ICD-10-CM | POA: Diagnosis not present

## 2016-08-19 DIAGNOSIS — M5030 Other cervical disc degeneration, unspecified cervical region: Secondary | ICD-10-CM | POA: Diagnosis not present

## 2016-08-25 ENCOUNTER — Other Ambulatory Visit: Payer: Medicare Other

## 2016-08-26 ENCOUNTER — Other Ambulatory Visit: Payer: Medicare Other

## 2016-08-26 DIAGNOSIS — M5416 Radiculopathy, lumbar region: Secondary | ICD-10-CM | POA: Diagnosis not present

## 2016-08-27 ENCOUNTER — Telehealth: Payer: Self-pay | Admitting: Family Medicine

## 2016-08-27 ENCOUNTER — Telehealth: Payer: Self-pay | Admitting: Cardiology

## 2016-08-27 NOTE — Telephone Encounter (Signed)
New message    Pt verbalized that she is calling because she was told that she needs to have medications held because she is on Coumadin in order to have her back injectilons

## 2016-08-27 NOTE — Telephone Encounter (Signed)
Patient called stating that she was due to have a spinal injection today 10/10 but called her today stating that there will be a 5 day delay due to being on coumadin. Patient would like Dr. Laurance Flatten to call her

## 2016-08-27 NOTE — Telephone Encounter (Signed)
Dr. Dayton Bailiff [phone] - recommended a spinal injection. [facility].   Pt called to report she was to have injection today but did not because she is on coumadin.   Procedure has been postponed to next week. We have not received information from provider that I am aware. Will try to call provider to get date tomorrow as already after 5 today.   Tammy Eckard usually manages Coumadin   [phone] - pt cell.

## 2016-08-28 NOTE — Telephone Encounter (Signed)
See note from [facility] for dosing. Agree ok to hold for Afib with CHADS <4.

## 2016-08-28 NOTE — Telephone Encounter (Signed)
Thanks Agree 

## 2016-08-28 NOTE — Telephone Encounter (Signed)
Called patient.  New appt for Epidural is Wed, Oct 18th afternoon. She has been instructed to take last warfarin dose Thurs, Oct 12th and then hold for 5 days until after injection - will restart Thurs, Oct 19th. Due recheck INR 09/10/2016.

## 2016-08-30 ENCOUNTER — Telehealth: Payer: Self-pay | Admitting: *Deleted

## 2016-08-30 NOTE — Telephone Encounter (Signed)
Requesting surgical clearance:   1. Type of surgery: ESI Lumbar  2. Surgeon: Marlaine Hind  3. Surgical date: 09/04/2016  4. Medications that need to be help: Warfarin  5. [facility]: Mamie Nick) [phone] (F) [phone]   Pt saw you on 04/17/2016 in [place], Is pt cleared for surgery? Pt was cleared by Cherre Robins on holding her warfarin

## 2016-08-31 NOTE — Telephone Encounter (Signed)
OK to hold warfarin for the procedure.  The patient has no high risk findings and is OK to proceed with surgery.

## 2016-09-02 ENCOUNTER — Encounter: Payer: Self-pay | Admitting: Pharmacist

## 2016-09-02 ENCOUNTER — Other Ambulatory Visit: Payer: Self-pay | Admitting: *Deleted

## 2016-09-02 MED ORDER — PINDOLOL 5 MG PO TABS: ORAL_TABLET | ORAL | 0 refills | Status: DC

## 2016-09-02 NOTE — Telephone Encounter (Signed)
Clearance faxed via Epic.  

## 2016-09-04 ENCOUNTER — Ambulatory Visit (INDEPENDENT_AMBULATORY_CARE_PROVIDER_SITE_OTHER): Payer: Medicare Other | Admitting: Pharmacist

## 2016-09-04 ENCOUNTER — Telehealth: Payer: Self-pay | Admitting: Family Medicine

## 2016-09-04 DIAGNOSIS — I482 Chronic atrial fibrillation, unspecified: Secondary | ICD-10-CM

## 2016-09-04 DIAGNOSIS — M5416 Radiculopathy, lumbar region: Secondary | ICD-10-CM | POA: Diagnosis not present

## 2016-09-04 LAB — COAGUCHEK XS/INR WAIVED
INR: 1 (ref 0.9–1.1)
Prothrombin Time: 12.4 s

## 2016-09-05 MED ORDER — LISINOPRIL 40 MG PO TABS: 40.0000 mg | ORAL_TABLET | Freq: Every day | ORAL | 0 refills | Status: DC

## 2016-09-05 MED ORDER — WARFARIN SODIUM 5 MG PO TABS: ORAL_TABLET | ORAL | 0 refills | Status: DC

## 2016-09-05 MED ORDER — POTASSIUM CHLORIDE CRYS ER 20 MEQ PO TBCR: 20.0000 meq | EXTENDED_RELEASE_TABLET | Freq: Every day | ORAL | 0 refills | Status: DC

## 2016-09-05 NOTE — Telephone Encounter (Signed)
done

## 2016-09-10 ENCOUNTER — Encounter: Payer: Self-pay | Admitting: Pharmacist

## 2016-09-11 ENCOUNTER — Ambulatory Visit (INDEPENDENT_AMBULATORY_CARE_PROVIDER_SITE_OTHER): Payer: Medicare Other | Admitting: Pharmacist

## 2016-09-11 DIAGNOSIS — I482 Chronic atrial fibrillation, unspecified: Secondary | ICD-10-CM

## 2016-09-11 LAB — COAGUCHEK XS/INR WAIVED
INR: 1.4 — ABNORMAL HIGH (ref 0.9–1.1)
PROTHROMBIN TIME: 16.7 s

## 2016-09-16 ENCOUNTER — Other Ambulatory Visit (INDEPENDENT_AMBULATORY_CARE_PROVIDER_SITE_OTHER): Payer: Medicare Other

## 2016-09-16 DIAGNOSIS — I10 Essential (primary) hypertension: Secondary | ICD-10-CM | POA: Diagnosis not present

## 2016-09-16 DIAGNOSIS — I482 Chronic atrial fibrillation, unspecified: Secondary | ICD-10-CM

## 2016-09-16 DIAGNOSIS — Z0000 Encounter for general adult medical examination without abnormal findings: Secondary | ICD-10-CM

## 2016-09-17 ENCOUNTER — Other Ambulatory Visit: Payer: Self-pay | Admitting: *Deleted

## 2016-09-17 DIAGNOSIS — R945 Abnormal results of liver function studies: Principal | ICD-10-CM

## 2016-09-17 DIAGNOSIS — R7989 Other specified abnormal findings of blood chemistry: Secondary | ICD-10-CM

## 2016-09-17 LAB — NMR, LIPOPROFILE
CHOLESTEROL: 149 mg/dL (ref 100–199)
HDL CHOLESTEROL BY NMR: 65 mg/dL (ref >39)
HDL PARTICLE NUMBER: 28.1 umol/L — AB (ref >=30.5)
LDL Particle Number: 776 nmol/L (ref <1000)
LDL Size: 21 nm (ref >20.5)
LDL-C: 69 mg/dL (ref 0–99)
LP-IR Score: <25 (ref <=45)
Small LDL Particle Number: 279 nmol/L (ref <=527)
TRIGLYCERIDES BY NMR: 73 mg/dL (ref 0–149)

## 2016-09-17 LAB — CBC WITH DIFFERENTIAL/PLATELET
BASOS ABS: 0 10*3/uL (ref 0.0–0.2)
Basos: 0 %
EOS (ABSOLUTE): 0.1 10*3/uL (ref 0.0–0.4)
Eos: 3 %
HEMOGLOBIN: 12.3 g/dL (ref 11.1–15.9)
Hematocrit: 38.2 % (ref 34.0–46.6)
Immature Grans (Abs): 0 10*3/uL (ref 0.0–0.1)
Immature Granulocytes: 0 %
LYMPHS ABS: 1.6 10*3/uL (ref 0.7–3.1)
Lymphs: 31 %
MCH: 32.7 pg (ref 26.6–33.0)
MCHC: 32.2 g/dL (ref 31.5–35.7)
MCV: 102 fL — ABNORMAL HIGH (ref 79–97)
MONOCYTES: 10 %
Monocytes Absolute: 0.5 10*3/uL (ref 0.1–0.9)
Neutrophils Absolute: 3 10*3/uL (ref 1.4–7.0)
Neutrophils: 56 %
Platelets: 186 10*3/uL (ref 150–379)
RBC: 3.76 x10E6/uL — AB (ref 3.77–5.28)
RDW: 14.9 % (ref 12.3–15.4)
WBC: 5.3 10*3/uL (ref 3.4–10.8)

## 2016-09-17 LAB — BMP8+EGFR
BUN / CREAT RATIO: 24 (ref 12–28)
BUN: 17 mg/dL (ref 8–27)
CALCIUM: 8.7 mg/dL (ref 8.7–10.3)
CHLORIDE: 104 mmol/L (ref 96–106)
CO2: 27 mmol/L (ref 18–29)
Creatinine, Ser: 0.72 mg/dL (ref 0.57–1.00)
GFR calc Af Amer: 90 mL/min/{1.73_m2} (ref >59)
GFR calc non Af Amer: 78 mL/min/{1.73_m2} (ref >59)
GLUCOSE: 81 mg/dL (ref 65–99)
Potassium: 3.8 mmol/L (ref 3.5–5.2)
Sodium: 145 mmol/L — ABNORMAL HIGH (ref 134–144)

## 2016-09-17 LAB — HEPATIC FUNCTION PANEL
ALBUMIN: 3.7 g/dL (ref 3.5–4.7)
ALK PHOS: 99 IU/L (ref 39–117)
ALT: 93 IU/L — ABNORMAL HIGH (ref 0–32)
AST: 75 IU/L — ABNORMAL HIGH (ref 0–40)
Bilirubin Total: 0.3 mg/dL (ref 0.0–1.2)
Bilirubin, Direct: 0.14 mg/dL (ref 0.00–0.40)
TOTAL PROTEIN: 6.4 g/dL (ref 6.0–8.5)

## 2016-09-17 LAB — VITAMIN D 25 HYDROXY (VIT D DEFICIENCY, FRACTURES): Vit D, 25-Hydroxy: 38.6 ng/mL (ref 30.0–100.0)

## 2016-09-20 ENCOUNTER — Telehealth: Payer: Self-pay | Admitting: Family Medicine

## 2016-09-20 ENCOUNTER — Ambulatory Visit (INDEPENDENT_AMBULATORY_CARE_PROVIDER_SITE_OTHER): Payer: Medicare Other | Admitting: Family Medicine

## 2016-09-20 ENCOUNTER — Encounter: Payer: Self-pay | Admitting: Family Medicine

## 2016-09-20 VITALS — BP 120/70 | HR 92 | Temp 98.0°F | Ht 62.0 in | Wt 134.0 lb

## 2016-09-20 DIAGNOSIS — Z23 Encounter for immunization: Secondary | ICD-10-CM

## 2016-09-20 DIAGNOSIS — Z7689 Persons encountering health services in other specified circumstances: Secondary | ICD-10-CM | POA: Diagnosis not present

## 2016-09-20 DIAGNOSIS — I482 Chronic atrial fibrillation, unspecified: Secondary | ICD-10-CM

## 2016-09-20 DIAGNOSIS — R609 Edema, unspecified: Secondary | ICD-10-CM

## 2016-09-20 DIAGNOSIS — R6889 Other general symptoms and signs: Secondary | ICD-10-CM | POA: Diagnosis not present

## 2016-09-20 DIAGNOSIS — R7989 Other specified abnormal findings of blood chemistry: Secondary | ICD-10-CM

## 2016-09-20 DIAGNOSIS — R945 Abnormal results of liver function studies: Secondary | ICD-10-CM

## 2016-09-20 DIAGNOSIS — I10 Essential (primary) hypertension: Secondary | ICD-10-CM

## 2016-09-20 LAB — COAGUCHEK XS/INR WAIVED
INR: 2.1 — AB (ref 0.9–1.1)
Prothrombin Time: 25.1 s

## 2016-09-20 NOTE — Progress Notes (Signed)
Subjective:    Patient ID: Susan Fitzgerald, female    DOB: 12-22-1931, 82 y.o.   MRN: [id number]  HPI Pt here for follow up and management of chronic medical problems which includes a fib and hypertension. She is taking medications regularly.The patient has had recent lab work done and this will be reviewed with her during the visit today. This was done on October 30. 2 liver function tests were elevated and previously these were normal. The vitamin D level was good and within normal limits. The blood sugar was good and kidney function tests were within normal limits and electrolytes were good with a sodium being only slightly elevated. The white blood cell count is not elevated and the hemoglobin is stable at 12.3. The platelet count was adequate. Vitamin D level was within normal limits and stable and consistent with past readings is slightly lower than in the past. All cholesterol numbers were improved and at goal other than the good cholesterol the HDL particle number being low. The patient does complain today of increased heart rate in the 90s versus are normal being in the 60s. She has some shortness of breath with exertion. She's had some lower extremity edema. She is considering the possibility of having some surgery by the orthopedist. She does see the cardiologist regularly and the rheumatologist Dr. Amil Amen regularly. The patient has also been seeing the neurosurgeon, Dr. Cyndy Freeze. She's had one injection in her back but is still having pain down her right leg with certain movements as well as weakness. She was told she could come back for another injection. She is not sure where this is going to lead her to meeting surgery or what?. She has seen the orthopedic surgeon for a shot in her hip. On her recent lab work she does have a couple liver function tests that are elevated and we will make sure that her rheumatologist gets a copy of this because he is giving her methotrexate. The patient denies any  chest pain. She has had some shortness of breath and some pulse rates up in the 90s. She denies any trouble with her stomach including nausea vomiting diarrhea or blood in the stool. She is also passing her water without problems. We did discuss with her that using some support hose might help take care of some of the edema. She will need to call the neurosurgeons back and possibly get another shot in her back. The next step we'll go from there as to what needs to be done.     Patient Active Problem List   Diagnosis Date Noted  . Rheumatoid arthritis ([facility]) 05/07/2016  . Symptomatic bradycardia 06/02/2013  . Hypertension 04/21/2013  . Osteoporosis 04/21/2013  . Atrial fibrillation, chronic ([facility]) 02/09/2013   Outpatient Encounter Prescriptions as of 09/20/2016  Medication Sig  . amLODipine (NORVASC) 5 MG tablet Take 5 mg by mouth daily. Pt takes 1/2 tablet daily  . calcium-vitamin D (OSCAL WITH D) 250-125 MG-UNIT per tablet Take 1 tablet by mouth 2 (two) times daily.  . flecainide (TAMBOCOR) 150 MG tablet Take one-half tablet by  mouth two times daily  . folic acid (FOLVITE) 1 MG tablet Take 1 mg by mouth daily.  . furosemide (LASIX) 20 MG tablet Take 1 to 2 tablets by  mouth daily as needed  . lisinopril (PRINIVIL,ZESTRIL) 40 MG tablet Take 1 tablet (40 mg total) by mouth daily.  . methotrexate (RHEUMATREX) 2.5 MG tablet Take 4 tablets (10 mg total) by mouth  once a week.  . Multiple Vitamin (MULTIVITAMIN) capsule Take 1 capsule by mouth daily.  . pindolol (VISKEN) 5 MG tablet TAKE 1 AND 1/2 TABLET BY  MOUTH TWO TIMES A DAY  . potassium chloride SA (K-DUR,KLOR-CON) 20 MEQ tablet Take 1 tablet (20 mEq total) by mouth daily.  Marland Kitchen warfarin (COUMADIN) 5 MG tablet TAKE 1 TABLET BY MOUTH ONCE DAILY OR AS DIRECTED  . acetaminophen (TYLENOL) 500 MG tablet Take 500 mg by mouth as directed.  . diclofenac sodium (VOLTAREN) 1 % GEL Apply 2 g topically 4 (four) times daily. (Patient not taking: Reported on  09/20/2016)   No facility-administered encounter medications on file as of 09/20/2016.      Review of Systems  Constitutional: Negative.   HENT: Negative.   Eyes: Negative.   Respiratory: Positive for shortness of breath (with excertion).   Cardiovascular: Positive for leg swelling (worse late in the day).       Elevated Heart rate  Gastrointestinal: Negative.   Endocrine: Negative.   Genitourinary: Negative.   Musculoskeletal: Positive for arthralgias (right side hip pain).  Skin: Negative.   Allergic/Immunologic: Negative.   Neurological: Negative.   Hematological: Negative.   Psychiatric/Behavioral: Negative.        Objective:   Physical Exam  Constitutional: She is oriented to person, place, and time. She appears well-developed and well-nourished. No distress.  The patient is pleasant and alert  HENT:  Head: Normocephalic and atraumatic.  Right Ear: External ear normal.  Left Ear: External ear normal.  Nose: Nose normal.  Mouth/Throat: Oropharynx is clear and moist. No oropharyngeal exudate.  Eyes: Conjunctivae and EOM are normal. Pupils are equal, round, and reactive to light. Right eye exhibits no discharge. Left eye exhibits no discharge. No scleral icterus.  Neck: Normal range of motion. Neck supple. No thyromegaly present.  No bruits or thyromegaly  Cardiovascular: Normal rate, normal heart sounds and intact distal pulses.   No murmur heard. The heart is irregular irregular at 96-108  Pulmonary/Chest: Effort normal and breath sounds normal. No respiratory distress. She has no wheezes. She has no rales.  Abdominal: Soft. Bowel sounds are normal. She exhibits no mass. There is no tenderness. There is no rebound and no guarding.  Musculoskeletal: Normal range of motion. She exhibits edema. She exhibits no tenderness.  Minimal edema currently.  Lymphadenopathy:    She has no cervical adenopathy.  Neurological: She is alert and oriented to person, place, and time. She  has normal reflexes. No cranial nerve deficit.  Skin: Skin is warm and dry. No rash noted.  Psychiatric: She has a normal mood and affect. Her behavior is normal. Judgment and thought content normal.  Nursing note and vitals reviewed.  BP 120/70 (BP Location: Left Arm)   Pulse 92   Temp 98 F (36.7 C) (Oral)   Ht 5\' 2"  (1.575 m)   Wt 134 lb (60.8 kg)   BMI 24.51 kg/m         Assessment & Plan:  1. Atrial fibrillation, chronic (HCC) -Take extra half of viskin as directed when needed -Appointment with cardiology for follow-up - CoaguChek XS/INR Waived  2. Essential hypertension -Blood pressure is good today and no change specifically in treatment for this  3. Edema, unspecified type -Wear support hose and put these on the first thing with arising in the morning  4. Increased liver function tests -Get ultrasound of liver -Patient will discuss this with the rheumatologist and adjust dose of methotrexate as he  recommends  Patient Instructions                       Medicare Annual Wellness Visit  [facility] and the medical providers at [facility] strive to bring you the best medical care.  In doing so we not only want to address your current medical conditions and concerns but also to detect new conditions early and prevent illness, disease and health-related problems.    Medicare offers a yearly Wellness Visit which allows our clinical staff to assess your need for preventative services including immunizations, lifestyle education, counseling to decrease risk of preventable diseases and screening for fall risk and other medical concerns.    This visit is provided free of charge (no copay) for all Medicare recipients. The clinical pharmacists at [facility] have begun to conduct these Wellness Visits which will also include a thorough review of all your medications.    As you primary medical provider recommend that you make an  appointment for your Annual Wellness Visit if you have not done so already this year.  You may set up this appointment before you leave today or you may call back [phone]) and schedule an appointment.  Please make sure when you call that you mention that you are scheduling your Annual Wellness Visit with the clinical pharmacist so that the appointment may be made for the proper length of time.     Continue current medications. Continue good therapeutic lifestyle changes which include good diet and exercise. Fall precautions discussed with patient. If an FOBT was given today- please return it to our front desk. If you are over 50 years old - you may need Prevnar 10 or the adult Pneumonia vaccine.  **Flu shots are available--- please call and schedule a FLU-CLINIC appointment**  After your visit with [place] today you will receive a survey in the mail or online from [employer] regarding your care with [place]. Please take a moment to fill this out. Your feedback is very important to [place] as you can help [place] better understand your patient needs as well as improve your experience and satisfaction. WE CARE ABOUT YOU!!!   Continue to follow-up with rheumatology Continue to follow-up with cardiology Get ultrasound as planned Talk with rheumatology regarding methotrexate Increase Visken by one half appeal if heart rates running in the upper 90s or low 100s no more than once daily to do this. Continue to avoid salt Wear support hose and put these on the first thing with arising in the morning   Arrie Senate MD   .

## 2016-09-20 NOTE — Telephone Encounter (Signed)
Pt called and aware

## 2016-09-20 NOTE — Patient Instructions (Addendum)
Medicare Annual Wellness Visit  [facility] and the medical providers at [facility] strive to bring you the best medical care.  In doing so we not only want to address your current medical conditions and concerns but also to detect new conditions early and prevent illness, disease and health-related problems.    Medicare offers a yearly Wellness Visit which allows our clinical staff to assess your need for preventative services including immunizations, lifestyle education, counseling to decrease risk of preventable diseases and screening for fall risk and other medical concerns.    This visit is provided free of charge (no copay) for all Medicare recipients. The clinical pharmacists at [facility] have begun to conduct these Wellness Visits which will also include a thorough review of all your medications.    As you primary medical provider recommend that you make an appointment for your Annual Wellness Visit if you have not done so already this year.  You may set up this appointment before you leave today or you may call back ([phone]) and schedule an appointment.  Please make sure when you call that you mention that you are scheduling your Annual Wellness Visit with the clinical pharmacist so that the appointment may be made for the proper length of time.     Continue current medications. Continue good therapeutic lifestyle changes which include good diet and exercise. Fall precautions discussed with patient. If an FOBT was given today- please return it to our front desk. If you are over 74 years old - you may need Prevnar 20 or the adult Pneumonia vaccine.  **Flu shots are available--- please call and schedule a FLU-CLINIC appointment**  After your visit with [place] today you will receive a survey in the mail or online from [employer] regarding your care with [place]. Please take a moment to fill this out. Your feedback is very  important to [place] as you can help [place] better understand your patient needs as well as improve your experience and satisfaction. WE CARE ABOUT YOU!!!   Continue to follow-up with rheumatology Continue to follow-up with cardiology Get ultrasound as planned Talk with rheumatology regarding methotrexate Increase Visken by one half appeal if heart rates running in the upper 90s or low 100s no more than once daily to do this. Continue to avoid salt Wear support hose and put these on the first thing with arising in the morning

## 2016-09-20 NOTE — Addendum Note (Signed)
Addended by: Zannie Cove on: 09/20/2016 02:52 PM   Modules accepted: Orders

## 2016-09-21 LAB — HEPATIC FUNCTION PANEL
ALK PHOS: 93 IU/L (ref 39–117)
ALT: 41 IU/L — AB (ref 0–32)
AST: 22 IU/L (ref 0–40)
Albumin: 3.8 g/dL (ref 3.5–4.7)
BILIRUBIN, DIRECT: 0.17 mg/dL (ref 0.00–0.40)
Bilirubin Total: 0.5 mg/dL (ref 0.0–1.2)
Total Protein: 6.4 g/dL (ref 6.0–8.5)

## 2016-09-23 ENCOUNTER — Encounter: Payer: Self-pay | Admitting: Pharmacist

## 2016-09-23 ENCOUNTER — Other Ambulatory Visit (INDEPENDENT_AMBULATORY_CARE_PROVIDER_SITE_OTHER): Payer: Medicare Other

## 2016-09-23 DIAGNOSIS — Z1211 Encounter for screening for malignant neoplasm of colon: Secondary | ICD-10-CM | POA: Diagnosis not present

## 2016-09-23 DIAGNOSIS — M5416 Radiculopathy, lumbar region: Secondary | ICD-10-CM | POA: Diagnosis not present

## 2016-09-23 DIAGNOSIS — M9983 Other biomechanical lesions of lumbar region: Secondary | ICD-10-CM | POA: Diagnosis not present

## 2016-09-23 LAB — SPECIMEN STATUS REPORT

## 2016-09-23 LAB — THYROID PANEL WITH TSH
Free Thyroxine Index: 1.6 (ref 1.2–4.9)
T3 Uptake Ratio: 25 % (ref 24–39)
T4, Total: 6.3 ug/dL (ref 4.5–12.0)
TSH: 2.57 u[IU]/mL (ref 0.450–4.500)

## 2016-09-24 ENCOUNTER — Encounter: Payer: Self-pay | Admitting: Family Medicine

## 2016-09-24 ENCOUNTER — Encounter: Payer: Self-pay | Admitting: Nurse Practitioner

## 2016-09-24 ENCOUNTER — Ambulatory Visit (HOSPITAL_COMMUNITY)
Admission: RE | Admit: 2016-09-24 | Discharge: 2016-09-24 | Disposition: A | Discharge status: Home / Self Care | Payer: Medicare Other | Source: Ambulatory Visit | Attending: Family Medicine | Admitting: Family Medicine

## 2016-09-24 ENCOUNTER — Ambulatory Visit (INDEPENDENT_AMBULATORY_CARE_PROVIDER_SITE_OTHER): Payer: Medicare Other | Admitting: Nurse Practitioner

## 2016-09-24 VITALS — BP 168/100 | HR 109 | Ht 62.0 in | Wt 135.8 lb

## 2016-09-24 DIAGNOSIS — I480 Paroxysmal atrial fibrillation: Secondary | ICD-10-CM | POA: Diagnosis not present

## 2016-09-24 DIAGNOSIS — I700 Atherosclerosis of aorta: Secondary | ICD-10-CM | POA: Diagnosis not present

## 2016-09-24 DIAGNOSIS — I10 Essential (primary) hypertension: Secondary | ICD-10-CM | POA: Diagnosis not present

## 2016-09-24 DIAGNOSIS — R945 Abnormal results of liver function studies: Secondary | ICD-10-CM

## 2016-09-24 DIAGNOSIS — R7989 Other specified abnormal findings of blood chemistry: Secondary | ICD-10-CM | POA: Insufficient documentation

## 2016-09-24 DIAGNOSIS — D1803 Hemangioma of intra-abdominal structures: Secondary | ICD-10-CM | POA: Diagnosis not present

## 2016-09-24 LAB — FECAL OCCULT BLOOD, IMMUNOCHEMICAL: Fecal Occult Bld: NEGATIVE

## 2016-09-24 MED ORDER — PINDOLOL 10 MG PO TABS: 10.0000 mg | ORAL_TABLET | Freq: Two times a day (BID) | ORAL | 2 refills | Status: DC

## 2016-09-24 MED ORDER — PINDOLOL 10 MG PO TABS: 10.0000 mg | ORAL_TABLET | Freq: Every day | ORAL | 2 refills | Status: DC

## 2016-09-24 NOTE — Patient Instructions (Addendum)
Medication Instructions:  INCREASE Visken (Pindolol) from 5mg  to 10mg  Take 1 tablet by mouth twice a day  (its fine to take TWO 5mg  tablets of Visken twice a day until you receive the 10mg  tablets) STOP Norvasc (Amlodipine)  Labwork: None   Testing/Procedures: None   Follow-Up: Your physician recommends that you schedule a follow-up appointment in: [facility]   Any Other Special Instructions Will Be Listed Below (If Applicable).     If you need a refill on your cardiac medications before your next appointment, please call your pharmacy.

## 2016-09-24 NOTE — Progress Notes (Signed)
Office Visit    Patient Name: Susan Fitzgerald Date of Encounter: 09/24/2016  Primary Care Provider:  Redge Gainer, MD Primary Cardiologist:  Lenna Sciara. Hochrein, MD   Chief Complaint    82 year old female with a history of paroxysmal atrial fibrillation who presents with more frequent paroxysms of A. fib.  Past Medical History    Past Medical History:  Diagnosis Date  . Arthritis   . Bradycardia 2014  . Cataract   . Diverticulosis   . History of stress test    a. 04/09/13-nuclear stress-no ischemia low risk  . Hypertension   . Menopause   . Osteoporosis   . PAF (paroxysmal atrial fibrillation) ([place])    a. s/p prior DCCV; b. On flecainide & coumadin (CHA2DS2VASc = 4);  c. 03/2016 Echo: EF 55-60%, mod LVH, mod AI, mild to mod TR, PASP 40mmHg.  Marland Kitchen Pelvic fracture [facility])    Past Surgical History:  Procedure Laterality Date  . BIOPSY BREAST    . CATARACT EXTRACTION    . EYE SURGERY      Allergies  Allergies  Allergen Reactions  . Benazepril Hcl Cough    History of Present Illness    82 year old female with a prior history of atrial fibrillation status post prior cardioversion and subsequent antiarrhythmic therapy with flecainide. She is anticoagulated with Coumadin. She also takes pindolol. Over the years, she has had occasional paroxysms of short-lived A. fib. Earlier this year, she had a period of atrial fibrillation, which is usually associated with dyspnea when it occurs, and she took an additional pindolol and this subsequently broke. She was doing well when she last saw Dr. Percival Spanish in June. Since early September however, she has noted more frequent elevated heart rates and irregular heart rhythm. When in A. fib, rates are typically in the mid 90s to low 100s. She records her blood pressures and heart rates on a regular basis and has shown these to me today. Over the past 2 months, she is mostly in the 80s to 90s though occasionally will have rates in the 70s and 60s. She does  not think that she has been in atrial fibrillation all of the time over the past 2 months. As above, she does have dyspnea on exertion when in A. fib but denies chest pain, PND, orthopnea, dizziness, syncope, or early satiety. She does have some degree of mild chronic venous stasis and lower extremity swelling and is interested in coming off of amlodipine if possible.  Home Medications    Prior to Admission medications   Medication Sig Start Date End Date Taking? Authorizing Provider  acetaminophen (TYLENOL) 500 MG tablet Take 500 mg by mouth as directed.   Yes Historical Provider, MD  calcium-vitamin D (OSCAL WITH D) 250-125 MG-UNIT per tablet Take 1 tablet by mouth 2 (two) times daily.   Yes Historical Provider, MD  diclofenac sodium (VOLTAREN) 1 % GEL Apply 2 g topically 4 (four) times daily. 06/27/16  Yes Fransisca Kaufmann Dettinger, MD  flecainide [facility]) 150 MG tablet Take one-half tablet by  mouth two times daily 06/04/16  Yes Chipper Herb, MD  folic acid (FOLVITE) 1 MG tablet Take 1 mg by mouth daily.   Yes Historical Provider, MD  furosemide (LASIX) 20 MG tablet Take 1 to 2 tablets by  mouth daily as needed 02/07/16  Yes Chipper Herb, MD  lisinopril (PRINIVIL,ZESTRIL) 40 MG tablet Take 1 tablet (40 mg total) by mouth daily. 09/05/16  Yes Chipper Herb,  MD  methotrexate (RHEUMATREX) 2.5 MG tablet Take 4 tablets (10 mg total) by mouth once a week. 04/24/16  Yes Chipper Herb, MD  Multiple Vitamin (MULTIVITAMIN) capsule Take 1 capsule by mouth daily.   Yes Historical Provider, MD  pindolol (VISKEN) 10 MG tablet Take 1 tablet (10 mg total) by mouth 2 (two) times daily. 09/24/16  Yes Rogelia Mire, NP  potassium chloride SA (K-DUR,KLOR-CON) 20 MEQ tablet Take 1 tablet (20 mEq total) by mouth daily. 09/05/16  Yes Chipper Herb, MD  warfarin (COUMADIN) 5 MG tablet TAKE 1 TABLET BY MOUTH ONCE DAILY OR AS DIRECTED 09/05/16  Yes Chipper Herb, MD    Review of Systems    Elevated heart rates  with dyspnea on exertion as outlined above. She denies chest pain, palpitations, PND, orthopnea, dizziness, syncope, or early satiety.  All other systems reviewed and are otherwise negative except as noted above.  Physical Exam    VS:  BP (!) 168/100   Pulse (!) 109   Ht 5\' 2"  (1.575 m)   Wt 135 lb 12.8 oz (61.6 kg)   BMI 24.84 kg/m  , BMI Body mass index is 24.84 kg/m. GEN: Well nourished, well developed, in no acute distress.  HEENT: normal.  Neck: Supple, no JVD, carotid bruits, or masses. Cardiac: Irregularly irregular, no murmurs, rubs, or gallops. No clubbing, cyanosis, edema.  Radials/DP/PT 2+ and equal bilaterally.  Respiratory:  Respirations regular and unlabored, clear to auscultation bilaterally. GI: Soft, nontender, nondistended, BS + x 4. MS: no deformity or atrophy. Skin: warm and dry, no rash. Neuro:  Strength and sensation are intact. Psych: Normal affect.  Accessory Clinical Findings    ECG - A. fib, 109, left axis, poor R-wave progression, no acute ST or T changes.  Assessment & Plan    1.  Paroxysmal atrial fibrillation: Over the past 2 months, patient has noticed increased frequency of irregular heart rhythm with elevated heart rates. She checks her blood pressure and heart rate heart rate fairly diligently at home. Rates have typically been in the mid 90s to low 100s since September though she occasionally has rates in the 60s and 70s. She does not think that she has been in the A. fib all of the time but instead thinks that it's just been happening more frequently. When this occurs, she does experience dyspnea. She has not tried taking additional beta blocker. She remains on flecainide 75 mg twice a day and is also on pindolol 7.5 mg twice a day. I recommended that we increase her pindolol 10 mg twice a day to achieve improved rate control and to help her with symptoms. I will also refer to A. fib clinic for further discussion about future management which may  include titration of flecainide versus switching to an alternate agent. At this point, she does not think that she would be interested in ablation, though her cousin was recently ablated by Dr. Rayann Heman. She is anticoagulated with Coumadin and INRs are followed at [facility].  2. Essential hypertension: Blood pressure is elevated today though aced on her records from home, her pressure typically runs in the 120s and 130s. She is interested in coming off of amlodipine because of mild lower extremity swelling. As I am increasing her pindolol 10 mg twice a day, I have advised that she may discontinue amlodipine and continue to follow her blood pressures at home. If pressures begin to increase in heart rate remain elevated, we can  push her pindolol further.  3. Disposition: Follow-up in A. fib clinic within the next few weeks.  Murray Hodgkins, NP 09/24/2016, 4:39 PM

## 2016-09-25 ENCOUNTER — Telehealth: Payer: Self-pay | Admitting: *Deleted

## 2016-09-25 NOTE — Telephone Encounter (Signed)
Entry Error

## 2016-09-26 ENCOUNTER — Telehealth: Payer: Self-pay | Admitting: Family Medicine

## 2016-09-26 ENCOUNTER — Ambulatory Visit (HOSPITAL_COMMUNITY)
Admission: RE | Admit: 2016-09-26 | Discharge: 2016-09-26 | Disposition: A | Discharge status: Home / Self Care | Payer: Medicare Other | Source: Ambulatory Visit | Attending: Nurse Practitioner | Admitting: Nurse Practitioner

## 2016-09-26 ENCOUNTER — Encounter (HOSPITAL_COMMUNITY): Payer: Self-pay | Admitting: Nurse Practitioner

## 2016-09-26 VITALS — BP 144/84 | HR 105 | Ht 62.0 in | Wt 135.6 lb

## 2016-09-26 DIAGNOSIS — I4819 Other persistent atrial fibrillation: Secondary | ICD-10-CM

## 2016-09-26 DIAGNOSIS — I481 Persistent atrial fibrillation: Secondary | ICD-10-CM | POA: Diagnosis not present

## 2016-09-26 DIAGNOSIS — Z7901 Long term (current) use of anticoagulants: Secondary | ICD-10-CM | POA: Diagnosis not present

## 2016-09-26 DIAGNOSIS — I10 Essential (primary) hypertension: Secondary | ICD-10-CM | POA: Insufficient documentation

## 2016-09-26 DIAGNOSIS — Z79899 Other long term (current) drug therapy: Secondary | ICD-10-CM | POA: Diagnosis not present

## 2016-09-26 DIAGNOSIS — I480 Paroxysmal atrial fibrillation: Secondary | ICD-10-CM | POA: Diagnosis not present

## 2016-09-26 NOTE — Progress Notes (Signed)
Primary Care Physician: Redge Gainer, MD Referring Physician: Ignacia Bayley, NP Cardiologist: Dr. Neomia Dear Susan Fitzgerald is a 82 y.o. female with a h/o paroxysmal afib that is in the afib clinic for evaluation for recent increase in afib burden. She has  history of atrial fibrillation status post prior cardioversion and subsequent antiarrhythmic therapy with flecainide. She is anticoagulated with Coumadin. She also takes pindolol. Over the years, she has had occasional paroxysms of short-lived A. fib. Earlier this year, she had a period of atrial fibrillation, which is usually associated with dyspnea when it occurs, and she took an additional pindolol and this subsequently broke. She was doing well when she last saw Dr. Percival Spanish in June. Since early September however, she has noted more frequent elevated heart rates and irregular heart rhythm. When in A. fib, rates are typically in the mid 90s to low 100s. She records her blood pressures and heart rates on a regular basis and has shown these to me today. Over the past 2 months, she is mostly in the 80s to 90s though occasionally will have rates in the 70s and 60s. She does not think that she has been in atrial fibrillation all of the time over the past 2 months. As above, she does have dyspnea on exertion when in A. fib but denies chest pain, PND, orthopnea, dizziness, syncope, or early satiety. She does have some degree of mild chronic venous stasis and lower extremity swelling and recently taken off amlodipine.  On visit with me, she feels that she has been in afib consistently over the last two weeks. I reviewed her V/S and her heart rate has been elevated closer to 100 bpm. In SR, she has a heart rate closer to 60. Discussion with pt for cardioversion with continuation on flecainide or flecainide washout and change to tikosyn. Pt is also planning to have an epidural Tuesday and will have to stop coumadin tonight. She wants to pursue this prior  to any change in afib management. It was also discussed that coumadin will have to be therapeutic x 3-4 weeks prior to any change in therapy.   Today, she denies symptoms of  chest pain,  orthopnea, PND, lower extremity edema, dizziness, presyncope, syncope, or neurologic sequela.  Mild c/o of fatigue, shortness of breath, palpitations. The patient is tolerating medications without difficulties and is otherwise without complaint today.   Past Medical History:  Diagnosis Date  . Arthritis   . Bradycardia 2014  . Cataract   . Diverticulosis   . History of stress test    a. 04/09/13-nuclear stress-no ischemia low risk  . Hypertension   . Menopause   . Osteoporosis   . PAF (paroxysmal atrial fibrillation) ([place])    a. s/p prior DCCV; b. On flecainide & coumadin (CHA2DS2VASc = 4);  c. 03/2016 Echo: EF 55-60%, mod LVH, mod AI, mild to mod TR, PASP 13mmHg.  Marland Kitchen Pelvic fracture [facility])    Past Surgical History:  Procedure Laterality Date  . BIOPSY BREAST    . CATARACT EXTRACTION    . EYE SURGERY      Current Outpatient Prescriptions  Medication Sig Dispense Refill  . acetaminophen (TYLENOL) 500 MG tablet Take 500 mg by mouth as directed.    . calcium-vitamin D (OSCAL WITH D) 250-125 MG-UNIT per tablet Take 1 tablet by mouth 2 (two) times daily.    . diclofenac sodium (VOLTAREN) 1 % GEL Apply 2 g topically 4 (four) times daily. 100  g 2  . flecainide (TAMBOCOR) 150 MG tablet Take one-half tablet by  mouth two times daily 90 tablet 1  . folic acid (FOLVITE) 1 MG tablet Take 1 mg by mouth daily.    . furosemide (LASIX) 20 MG tablet Take 1 to 2 tablets by  mouth daily as needed 180 tablet 1  . lisinopril (PRINIVIL,ZESTRIL) 40 MG tablet Take 1 tablet (40 mg total) by mouth daily. 90 tablet 0  . methotrexate (RHEUMATREX) 2.5 MG tablet Take 4 tablets (10 mg total) by mouth once a week. 48 tablet 3  . Multiple Vitamin (MULTIVITAMIN) capsule Take 1 capsule by mouth daily.    . pindolol (VISKEN) 10 MG  tablet Take 1 tablet (10 mg total) by mouth 2 (two) times daily. 180 tablet 2  . potassium chloride SA (K-DUR,KLOR-CON) 20 MEQ tablet Take 1 tablet (20 mEq total) by mouth daily. 90 tablet 0  . warfarin (COUMADIN) 5 MG tablet TAKE 1 TABLET BY MOUTH ONCE DAILY OR AS DIRECTED 90 tablet 0   No current facility-administered medications for this encounter.     Allergies  Allergen Reactions  . Benazepril Hcl Cough    Social History   Social History  . Marital status: Married    Spouse name: N/A  . Number of children: N/A  . Years of education: N/A   Occupational History  . Not on file.   Social History Main Topics  . Smoking status: Never Smoker  . Smokeless tobacco: Never Used  . Alcohol use No  . Drug use: No  . Sexual activity: Yes   Other Topics Concern  . Not on file   Social History Narrative  . No narrative on file    Family History  Problem Relation Age of Onset  . Stroke Mother     cerebral hemorrhage  . Heart disease Father     MI  . Heart attack Father   . Vision loss Father   . Heart disease Brother   . Heart attack Brother   . Cancer Maternal Aunt     breast  . Cancer Brother   . Heart disease Brother   . Cancer Daughter     breast    ROS- All systems are reviewed and negative except as per the HPI above  Physical Exam: Vitals:   09/26/16 0910  BP: (!) 144/84  Pulse: (!) 105  Weight: 135 lb 9.6 oz (61.5 kg)  Height: 5\' 2"  (1.575 m)    GEN- The patient is well appearing, alert and oriented x 3 today.   Head- normocephalic, atraumatic Eyes-  Sclera clear, conjunctiva pink Ears- hearing intact Oropharynx- clear Neck- supple, no JVP Lymph- no cervical lymphadenopathy Lungs- Clear to ausculation bilaterally, normal work of breathing Heart- Irregular rate and rhythm, no murmurs, rubs or gallops, PMI not laterally displaced GI- soft, NT, ND, + BS Extremities- no clubbing, cyanosis, or edema MS- no significant deformity or atrophy Skin- no  rash or lesion Psych- euthymic mood, full affect Neuro- strength and sensation are intact  EKG-afib at 105 bpm, LAD, qrs int 102 ms, qtc 467 ms Epic records reviewed  Assessment and Plan: 1. Paroxysmal afib with persistent afib x 2 weeks Discussed cardioversion with continuation of flecainide or change in antiarrythmic to Tikosyn. I believe her age would make ablation less favorable Unfortunately, pt is scheduled for spinal injection next Tuesday and will  have interruption in coumadin dose for a few days,,stopping coumadin tonight . She will need uninterrupted comadin  with therapeutic INR's of 3-4 weeks for either option above. Therefore, she will continue on current dose of pindolol and flecainide and return to the afib clinic in two weeks when she is back on coumadin and then can further discuss options.  Geroge Baseman Alarik Radu, [facility] [address] [place], [place] [address] [phone]

## 2016-09-26 NOTE — Telephone Encounter (Signed)
Labs 09-16-2016,09-20-2016 and Ultasound of the abdomen was faxed to Dr. Marijean Bravo @ [phone].

## 2016-09-30 ENCOUNTER — Ambulatory Visit (HOSPITAL_COMMUNITY): Payer: Medicare Other

## 2016-10-01 ENCOUNTER — Encounter: Payer: Self-pay | Admitting: Physician Assistant

## 2016-10-01 ENCOUNTER — Ambulatory Visit (INDEPENDENT_AMBULATORY_CARE_PROVIDER_SITE_OTHER): Payer: Medicare Other | Admitting: Physician Assistant

## 2016-10-01 VITALS — BP 132/88 | HR 106 | Temp 97.3°F | Ht 62.0 in | Wt 133.8 lb

## 2016-10-01 DIAGNOSIS — L03115 Cellulitis of right lower limb: Secondary | ICD-10-CM

## 2016-10-01 DIAGNOSIS — I482 Chronic atrial fibrillation, unspecified: Secondary | ICD-10-CM

## 2016-10-01 DIAGNOSIS — I10 Essential (primary) hypertension: Secondary | ICD-10-CM | POA: Diagnosis not present

## 2016-10-01 DIAGNOSIS — M5416 Radiculopathy, lumbar region: Secondary | ICD-10-CM | POA: Diagnosis not present

## 2016-10-01 DIAGNOSIS — M9983 Other biomechanical lesions of lumbar region: Secondary | ICD-10-CM | POA: Diagnosis not present

## 2016-10-01 LAB — COAGUCHEK XS/INR WAIVED
INR: 1 (ref 0.9–1.1)
Prothrombin Time: 12.3 s

## 2016-10-01 MED ORDER — CEPHALEXIN 500 MG PO CAPS: 500.0000 mg | ORAL_CAPSULE | Freq: Four times a day (QID) | ORAL | 0 refills | Status: DC

## 2016-10-01 NOTE — Patient Instructions (Signed)
PT 12.3 sec INR 1.0  Performed today

## 2016-10-02 ENCOUNTER — Other Ambulatory Visit: Payer: Self-pay | Admitting: *Deleted

## 2016-10-02 ENCOUNTER — Telehealth: Payer: Self-pay | Admitting: Family Medicine

## 2016-10-02 MED ORDER — PINDOLOL 10 MG PO TABS: 10.0000 mg | ORAL_TABLET | Freq: Two times a day (BID) | ORAL | 2 refills | Status: DC

## 2016-10-02 NOTE — Progress Notes (Signed)
BP 132/88 (BP Location: Left Arm, Patient Position: Sitting, Cuff Size: Normal)   Pulse (!) 106   Temp 97.3 F (36.3 C) (Oral)   Ht 5\' 2"  (1.575 m)   Wt 133 lb 12.8 oz (60.7 kg)   BMI 24.47 kg/m    Subjective:    Patient ID: Susan Fitzgerald, female    DOB: 1932/03/05, 82 y.o.   MRN: [id number]  HPI: Susan Fitzgerald is a 82 y.o. female presenting on 10/01/2016 for Patient is here for protime (She is having epidural this afternoon at 1:30pm. )  She will be going this afternoon for another lumbar injection related to her irritated nerve and sciatica. She needed her ProTime performed to make sure that she was not have any bleeding complications. It was at 1.0 today a copy of the sheet has been given to her. I've also place it on her after visit summary and printed it off.  She has also had recurrent cellulitis in her right lower leg due to very poor circulation. She has the very beginning of a very small opening on the lateral anterior shin. There is minimal erythema surrounding it. There is no associated swelling or pus at this time. We have discussed using Keflex as treatment for this. She will start it tomorrow after her procedure.  Past Medical History:  Diagnosis Date  . Arthritis   . Bradycardia 2014  . Cataract   . Diverticulosis   . History of stress test    a. 04/09/13-nuclear stress-no ischemia low risk  . Hypertension   . Menopause   . Osteoporosis   . PAF (paroxysmal atrial fibrillation) ([place])    a. s/p prior DCCV; b. On flecainide & coumadin (CHA2DS2VASc = 4);  c. 03/2016 Echo: EF 55-60%, mod LVH, mod AI, mild to mod TR, PASP 72mmHg.  Marland Kitchen Pelvic fracture ([facility])    Relevant past medical, surgical, family and social history reviewed and updated as indicated. Interim medical history since our last visit reviewed. Allergies and medications reviewed and updated. DATA REVIEWED: CHART IN EPIC  Social History   Social History  . Marital status: Married    Spouse name: N/A  .  Number of children: N/A  . Years of education: N/A   Occupational History  . Not on file.   Social History Main Topics  . Smoking status: Never Smoker  . Smokeless tobacco: Never Used  . Alcohol use No  . Drug use: No  . Sexual activity: Yes   Other Topics Concern  . Not on file   Social History Narrative  . No narrative on file    Past Surgical History:  Procedure Laterality Date  . BIOPSY BREAST    . CATARACT EXTRACTION    . EYE SURGERY      Family History  Problem Relation Age of Onset  . Stroke Mother     cerebral hemorrhage  . Heart disease Father     MI  . Heart attack Father   . Vision loss Father   . Heart disease Brother   . Heart attack Brother   . Cancer Maternal Aunt     breast  . Cancer Brother   . Heart disease Brother   . Cancer Daughter     breast    Review of Systems  Constitutional: Negative.  Negative for chills and fatigue.  HENT: Negative.   Eyes: Negative.   Respiratory: Negative.  Negative for cough and wheezing.   Cardiovascular: Negative.  Negative for  chest pain, palpitations and leg swelling.  Gastrointestinal: Negative.   Genitourinary: Negative.   Skin: Positive for wound.      Medication List       Accurate as of 10/01/16 11:59 PM. Always use your most recent med list.          acetaminophen 500 MG tablet Commonly known as:  TYLENOL Take 500 mg by mouth as directed.   calcium-vitamin D 250-125 MG-UNIT tablet Commonly known as:  OSCAL WITH D Take 1 tablet by mouth 2 (two) times daily.   cephALEXin 500 MG capsule Commonly known as:  KEFLEX Take 1 capsule (500 mg total) by mouth 4 (four) times daily.   diclofenac sodium 1 % Gel Commonly known as:  VOLTAREN Apply 2 g topically 4 (four) times daily.   flecainide 150 MG tablet Commonly known as:  TAMBOCOR Take one-half tablet by  mouth two times daily   folic acid 1 MG tablet Commonly known as:  FOLVITE Take 1 mg by mouth daily.   furosemide 20 MG  tablet Commonly known as:  LASIX Take 1 to 2 tablets by  mouth daily as needed   lisinopril 40 MG tablet Commonly known as:  PRINIVIL,ZESTRIL Take 1 tablet (40 mg total) by mouth daily.   methotrexate 2.5 MG tablet Commonly known as:  RHEUMATREX Take 4 tablets (10 mg total) by mouth once a week.   multivitamin capsule Take 1 capsule by mouth daily.   pindolol 10 MG tablet Commonly known as:  VISKEN Take 1 tablet (10 mg total) by mouth 2 (two) times daily.   potassium chloride SA 20 MEQ tablet Commonly known as:  K-DUR,KLOR-CON Take 1 tablet (20 mEq total) by mouth daily.   warfarin 5 MG tablet Commonly known as:  COUMADIN TAKE 1 TABLET BY MOUTH ONCE DAILY OR AS DIRECTED          Objective:    BP 132/88 (BP Location: Left Arm, Patient Position: Sitting, Cuff Size: Normal)   Pulse (!) 106   Temp 97.3 F (36.3 C) (Oral)   Ht 5\' 2"  (1.575 m)   Wt 133 lb 12.8 oz (60.7 kg)   BMI 24.47 kg/m   Allergies  Allergen Reactions  . Benazepril Hcl Cough    Wt Readings from Last 3 Encounters:  10/01/16 133 lb 12.8 oz (60.7 kg)  09/26/16 135 lb 9.6 oz (61.5 kg)  09/24/16 135 lb 12.8 oz (61.6 kg)    Physical Exam  Constitutional: She is oriented to person, place, and time. She appears well-developed and well-nourished.  HENT:  Head: Normocephalic and atraumatic.  Eyes: Conjunctivae and EOM are normal. Pupils are equal, round, and reactive to light.  Cardiovascular: Normal rate, regular rhythm, normal heart sounds and intact distal pulses.   Pulmonary/Chest: Effort normal and breath sounds normal.  Abdominal: Soft. Bowel sounds are normal.  Neurological: She is alert and oriented to person, place, and time. She has normal reflexes.  Skin: Skin is warm and dry. Lesion and rash noted. Rash is macular. There is erythema.     Psychiatric: She has a normal mood and affect. Her behavior is normal. Judgment and thought content normal.  Nursing note and vitals  reviewed.   Results for orders placed or performed in visit on 10/01/16  CoaguChek XS/INR Waived  Result Value Ref Range   INR 1.0 0.9 - 1.1   Prothrombin Time 12.3 sec      Assessment & Plan:   1. Atrial fibrillation, chronic ([place]) Resume  regular dosing after procedure. Recheck 1 week with Cherre Robins at [facility] family medicine. - CoaguChek XS/INR Waived  2. Cellulitis of right lower extremity Keflex 500 mg 1 tab 4 times a day 10 days   Continue all other maintenance medications as listed above.  Follow up plan:  Recheck one week with clinical pharmacist for pro time.   Educational handout given for lab results  Terald Sleeper PA-C [facility] [address]  [place], [place] [address] [phone]   10/02/2016, 9:04 AM

## 2016-10-03 ENCOUNTER — Telehealth: Payer: Self-pay | Admitting: Pharmacist

## 2016-10-03 NOTE — Telephone Encounter (Signed)
Appointment made for Monday, November 20th at 8:15am.  Left message for patient on VM at home

## 2016-10-07 ENCOUNTER — Ambulatory Visit (INDEPENDENT_AMBULATORY_CARE_PROVIDER_SITE_OTHER): Payer: Medicare Other | Admitting: Pharmacist

## 2016-10-07 ENCOUNTER — Inpatient Hospital Stay (HOSPITAL_COMMUNITY): Admission: RE | Admit: 2016-10-07 | Payer: Medicare Other | Source: Ambulatory Visit | Admitting: Nurse Practitioner

## 2016-10-07 DIAGNOSIS — M810 Age-related osteoporosis without current pathological fracture: Secondary | ICD-10-CM

## 2016-10-07 DIAGNOSIS — I482 Chronic atrial fibrillation, unspecified: Secondary | ICD-10-CM

## 2016-10-07 LAB — COAGUCHEK XS/INR WAIVED
INR: 1.4 — ABNORMAL HIGH (ref 0.9–1.1)
Prothrombin Time: 16.9 s

## 2016-10-07 NOTE — Progress Notes (Signed)
Patient ID: Susan Fitzgerald, female   DOB: 03/22/1932, 82 y.o.   MRN: [id number]   HPI: Patient with history of osteoporosis here today to review DEXA.  She also has atrial fibrillation and takes warfarin daily - she needs INR rechecked today also She took alendronate in the past for about 10 years - has been off for last 4 years.  We looked into Prolia coverage about 2 years ago but patient declined due to cost.   History of fractured pelvic belt. Back Pain?  Yes       Kyphosis?  No      Current warfarin dose is 5mg  - take 1/2 tablet MWF and 1 tablet all other days. She held warfarin for 5 days prior to spinal injection 10/01/16.  She restarted dose the evening of 10/01/16.                                                         PMH: Hysterectomy?  No Oophorectomy?  No HRT? No  Steroid Use?  No Thyroid med?  No History of cancer?  No History of digestive disorders (ie Crohn's)?  No Current or previous eating disorders?  No Last Vitamin D Result:  38.6 (09/16/2016) Last GFR Result:  78 (09/16/2016)   FH/SH: Family history of osteoporosis?  No Parent with history of hip fracture?  No Family history of breast cancer?  Yes - maternal aunt and daughter Exercise?  Yes - not currently as much due to leg and back pain but usually plays golf 2-3 times per week Smoking?  No Alcohol?  No    Calcium Assessment Calcium Intake  # of servings/day  Calcium mg  Milk (8 oz) 1  x  300  = 300mg   Yogurt (4 oz) 0 x  200 = 0  Cheese (1 oz) 1 x  200 = 200mg   Other Calcium sources   250mg   Ca supplement 500mg  = 500mg    Estimated calcium intake per day 1250mg     DEXA Results Date of Test T-Score for AP Spine L1-L4 T-Score for Neck of  Left Hip  07/30/2016 -1.0 -2.6  04/20/2014 -1.3 -2.5  04/15/2012 -1.0 -2.5  10/04/2009 -1.2 -2.6  08/10/2007 -1.2 -2.8   INR = 1.4 today  Assessment: Osteoporosis with stable BMD Subtherapeutic anticoagulation due to holding warfarin for  procedure  Recommendations: 1.  Looking into insurance coverage of Prolia and Reclast 2.  continue calcium 1200mg  daily through supplementation or diet.  3.  continue weight bearing exercise - 30 minutes at least 4 days per week.   4.  Counseled and educated about fall risk and prevention. 5.   Anticoagulation Dose Instructions as of 10/07/2016      Dorene Grebe Tue Wed Thu Fri Sat   New Dose 5 mg 2.5 mg 5 mg 2.5 mg 5 mg 2.5 mg 5 mg    Description   Take 1 and 1/2 tablets today - November 20th.  Then restart usual dose of warfarin 5mg  - take 1/2 tablet on mondays, wednesdays and fridays.  Take 1 tablet all other days.   INR was 1.4 today (a little thick due to holding warfarin prior to spinal injection)       Recheck DEXA:  2 years  Time spent counseling patient:  30 minutes

## 2016-10-07 NOTE — Patient Instructions (Signed)
Anticoagulation Dose Instructions as of 10/07/2016      Susan Fitzgerald Tue Wed Thu Fri Sat   New Dose 5 mg 2.5 mg 5 mg 2.5 mg 5 mg 2.5 mg 5 mg    Description   Take 1 and 1/2 tablets today - November 20th.  Then restart usual dose of warfarin 5mg  - take 1/2 tablet on mondays, wednesdays and fridays.  Take 1 tablet all other days.   INR was 1.4 today (a little thin due to holding warfarin prior to spinal injection)       Calcium & Vitamin D: The Facts  Why is calcium and vitamin D consumption important? Calcium: . Most Americans do not consume adequate amounts of calcium! Calcium is required for proper muscle function, nerve communication, bone support, and many other functions in the body.  . The body uses bones as a source of calcium. Bones 'remodel' themselves continuously - the body constantly breaks bone down to release calcium and rebuilds bones by replacing calcium in the bone later.  . As we get older, the rate of bone breakdown occurs faster than bone rebuilding which could lead to osteopenia, osteoporosis, and possible fractures.   Vitamin D: . People naturally make vitamin D in the body when sunlight hits the skin and triggers a process that leads to vitamin D production. This natural vitamin D production requires about 10-15 minutes of sun exposure on the hands, arms, and face at least 2-3 times per week. However, due to decreased sun exposure and the use of sunscreen, most people will need to get additional vitamin D from foods or supplements. Your doctor can measure your body's vitamin D level through a simple blood test to determine your daily vitamin D needs.  . Vitamin D is used to help the body absorb calcium, maintain bone health, help the immune system, and reduce inflammation. It also plays a role in muscle performance, balance and risk of falling.  . Vitamin D deficiency can lead to osteomalacia or softening of the bones, bone pain, and muscle weakness.   The recommended daily  allowance of Calcium and Vitamin D varies for different age groups. Age group Calcium (mg) Vitamin D (IU)  Females and Males: Age 47-50 1000 mg 600 IU  Females: Age 77- 76 1200 mg 600 IU  Males: Age 27-70 1000 mg 600 IU  Females and Males: Age 26+ 1200 mg 800 IU  Pregnant/lactating Females age 58-50 1000 mg 600 IU   How much Calcium do you get in your diet? Calcium Intake # of servings per day  Total calcium (mg)  Skim milk, 2% milk (1 cup) _________ x 300 mg   Yogurt (1 small container) _________ x 200 mg   Cheese (1oz) _________ x 200 mg   Cottage Cheese (1 cup)             ________ x 150 mg   Almond milk (1 cup) _________ x 450 mg   Fortified Orange Juice (1 cup) _________ x 300 mg   Broccoli or spinach ( 1 cup) _________ x 100 mg   Salmon (3 oz) _________ x 150 mg    Almonds (1/4 cup) _______ x 90 mg      How do we get Calcium and Vitamin D in our diet? Calcium: . Obtaining calcium from the diet is the most preferred way to reach the recommended daily goal. If this goal is not reached through diet, calcium supplements are available.  . Calcium is found in  many foods including: dairy products, dark leafy vegetables (like broccoli, kale, and spinach), fish, and fortified products like juices and cereals.  . The food label will have a %DV (percent daily value) listed showing the amount of calcium per serving. To determine the total mg per serving, simply replace the % with zero (0).  For example, Almond Breeze almond milk contains 45% DV of calcium or 450mg  per 1 cup.  . You can increase the amount of calcium in your diet by using more calcium products in your daily meals. Use yogurt and fruit to make smoothies or use yogurt to top baked potatoes or make whipped potatoes. Sprinkle low fat cheese onto salads or into egg white omelets. You can even add non-fat dry milk powder (300mg  calcium per 1/3 cup) to hot cereals, meat loaf, soups, or potatoes.  . Calcium supplements come in many  forms including tablets, chewables, and gummies. Be sure to read the label to determine the correct number of tablets per serving and whether or not to take the supplement with food.  . Calcium carbonate products (Oscal, Caltrate, and Viactiv) are generally better absorbed when taken with food while calcium citrate products like Citracal can be taken with or without food.  . The body can only absorb about 600 mg of calcium at one time. It is recommended to take calcium supplements in small amounts several times per day.  However, taking it all at once is better than not taking it at all. . Increasing your intake of calcium is essential for bone health, but may also lead to some side effects like constipation, increased gas, bloating or abdominal cramping. To help reduce these side effects, start with 1 tablet per day and slowly increase your intake of the supplement to the recommended doses. It is also recommended that you drink plenty of water each day. Vitamin D: . Very few foods naturally contain vitamin D. However, it is found in saltwater fish (like tuna, salmon and mackerel), beef liver, egg yolks, cheese and vitamin D fortified foods (like yogurt, cereals, orange juice and milk) . The amount of vitamin D in each food or product is listed as %DV on the product label. To determine the total amount of vitamin D per serving, drop the % sign and multiply the number by 4. For example, 1 cup of Almond Breeze almond milk contains 25% DV vitamin D or 100 IU per serving (25 x 4 =100). . Vitamin D is also found in multivitamins and supplements and may be listed as ergocalciferol (vitamin D2) or cholecalciferol (vitamin D3). Each of these forms of vitamin D are equivalent and the daily recommended intake will vary based on your age and the vitamin D levels in your body. Follow your doctor's recommendation for vitamin D intake.       Fall Prevention in the Home Falls can cause injuries and can affect people  from all age groups. There are many simple things that you can do to make your home safe and to help prevent falls. What can I do on the outside of my home?  Regularly repair the edges of walkways and driveways and fix any cracks.  Remove high doorway thresholds.  Trim any shrubbery on the main path into your home.  Use bright outdoor lighting.  Clear walkways of debris and clutter, including tools and rocks.  Regularly check that handrails are securely fastened and in good repair. Both sides of any steps should have handrails.  Install guardrails along  the edges of any raised decks or porches.  Have leaves, snow, and ice cleared regularly.  Use sand or salt on walkways during winter months.  In the garage, clean up any spills right away, including grease or oil spills. What can I do in the bathroom?  Use night lights.  Install grab bars by the toilet and in the tub and shower. Do not use towel bars as grab bars.  Use non-skid mats or decals on the floor of the tub or shower.  If you need to sit down while you are in the shower, use a plastic, non-slip stool.  Keep the floor dry. Immediately clean up any water that spills on the floor.  Remove soap buildup in the tub or shower on a regular basis.  Attach bath mats securely with double-sided non-slip rug tape.  Remove throw rugs and other tripping hazards from the floor. What can I do in the bedroom?  Use night lights.  Make sure that a bedside light is easy to reach.  Do not use oversized bedding that drapes onto the floor.  Have a firm chair that has side arms to use for getting dressed.  Remove throw rugs and other tripping hazards from the floor. What can I do in the kitchen?  Clean up any spills right away.  Avoid walking on wet floors.  Place frequently used items in easy-to-reach places.  If you need to reach for something above you, use a sturdy step stool that has a grab bar.  Keep electrical cables  out of the way.  Do not use floor polish or wax that makes floors slippery. If you have to use wax, make sure that it is non-skid floor wax.  Remove throw rugs and other tripping hazards from the floor. What can I do in the stairways?  Do not leave any items on the stairs.  Make sure that there are handrails on both sides of the stairs. Fix handrails that are broken or loose. Make sure that handrails are as long as the stairways.  Check any carpeting to make sure that it is firmly attached to the stairs. Fix any carpet that is loose or worn.  Avoid having throw rugs at the top or bottom of stairways, or secure the rugs with carpet tape to prevent them from moving.  Make sure that you have a light switch at the top of the stairs and the bottom of the stairs. If you do not have them, have them installed. What are some other fall prevention tips?  Wear closed-toe shoes that fit well and support your feet. Wear shoes that have rubber soles or low heels.  When you use a stepladder, make sure that it is completely opened and that the sides are firmly locked. Have someone hold the ladder while you are using it. Do not climb a closed stepladder.  Add color or contrast paint or tape to grab bars and handrails in your home. Place contrasting color strips on the first and last steps.  Use mobility aids as needed, such as canes, walkers, scooters, and crutches.  Turn on lights if it is dark. Replace any light bulbs that burn out.  Set up furniture so that there are clear paths. Keep the furniture in the same spot.  Fix any uneven floor surfaces.  Choose a carpet design that does not hide the edge of steps of a stairway.  Be aware of any and all pets.  Review your medicines with your healthcare  provider. Some medicines can cause dizziness or changes in blood pressure, which increase your risk of falling. Talk with your health care provider about other ways that you can decrease your risk of  falls. This may include working with a physical therapist or trainer to improve your strength, balance, and endurance. This information is not intended to replace advice given to you by your health care provider. Make sure you discuss any questions you have with your health care provider. Document Released: 10/25/2002 Document Revised: 04/02/2016 Document Reviewed: 12/09/2014 Elsevier Interactive Patient Education  2017 [employer].

## 2016-10-17 ENCOUNTER — Telehealth: Payer: Self-pay | Admitting: Family Medicine

## 2016-10-17 ENCOUNTER — Ambulatory Visit (HOSPITAL_COMMUNITY)
Admission: RE | Admit: 2016-10-17 | Discharge: 2016-10-17 | Disposition: A | Discharge status: Home / Self Care | Payer: Medicare Other | Source: Ambulatory Visit | Attending: Nurse Practitioner | Admitting: Nurse Practitioner

## 2016-10-17 ENCOUNTER — Ambulatory Visit (INDEPENDENT_AMBULATORY_CARE_PROVIDER_SITE_OTHER): Payer: Medicare Other | Admitting: Pharmacist

## 2016-10-17 VITALS — BP 152/90 | Ht 62.0 in | Wt 145.0 lb

## 2016-10-17 DIAGNOSIS — I481 Persistent atrial fibrillation: Secondary | ICD-10-CM | POA: Diagnosis not present

## 2016-10-17 DIAGNOSIS — I482 Chronic atrial fibrillation, unspecified: Secondary | ICD-10-CM

## 2016-10-17 DIAGNOSIS — I10 Essential (primary) hypertension: Secondary | ICD-10-CM | POA: Insufficient documentation

## 2016-10-17 DIAGNOSIS — Z8249 Family history of ischemic heart disease and other diseases of the circulatory system: Secondary | ICD-10-CM | POA: Insufficient documentation

## 2016-10-17 DIAGNOSIS — I480 Paroxysmal atrial fibrillation: Secondary | ICD-10-CM | POA: Insufficient documentation

## 2016-10-17 DIAGNOSIS — Z823 Family history of stroke: Secondary | ICD-10-CM | POA: Diagnosis not present

## 2016-10-17 DIAGNOSIS — Z888 Allergy status to other drugs, medicaments and biological substances status: Secondary | ICD-10-CM | POA: Insufficient documentation

## 2016-10-17 DIAGNOSIS — I4819 Other persistent atrial fibrillation: Secondary | ICD-10-CM

## 2016-10-17 DIAGNOSIS — Z79899 Other long term (current) drug therapy: Secondary | ICD-10-CM | POA: Insufficient documentation

## 2016-10-17 DIAGNOSIS — Z7901 Long term (current) use of anticoagulants: Secondary | ICD-10-CM | POA: Insufficient documentation

## 2016-10-17 LAB — CBC
HCT: 39.6 % (ref 36.0–46.0)
HEMOGLOBIN: 13 g/dL (ref 12.0–15.0)
MCH: 33 pg (ref 26.0–34.0)
MCHC: 32.8 g/dL (ref 30.0–36.0)
MCV: 100.5 fL — ABNORMAL HIGH (ref 78.0–100.0)
PLATELETS: 176 10*3/uL (ref 150–400)
RBC: 3.94 MIL/uL (ref 3.87–5.11)
RDW: 14.7 % (ref 11.5–15.5)
WBC: 7.1 10*3/uL (ref 4.0–10.5)

## 2016-10-17 LAB — BASIC METABOLIC PANEL
Anion gap: 7 (ref 5–15)
BUN: 22 mg/dL — AB (ref 6–20)
CHLORIDE: 105 mmol/L (ref 101–111)
CO2: 30 mmol/L (ref 22–32)
CREATININE: 1.03 mg/dL — AB (ref 0.44–1.00)
Calcium: 9 mg/dL (ref 8.9–10.3)
GFR, EST AFRICAN AMERICAN: 57 mL/min — AB (ref >60)
GFR, EST NON AFRICAN AMERICAN: 49 mL/min — AB (ref >60)
Glucose, Bld: 91 mg/dL (ref 65–99)
POTASSIUM: 4 mmol/L (ref 3.5–5.1)
SODIUM: 142 mmol/L (ref 135–145)

## 2016-10-17 LAB — COAGUCHEK XS/INR WAIVED
INR: 1.8 — AB (ref 0.9–1.1)
PROTHROMBIN TIME: 21.7 s

## 2016-10-17 NOTE — Patient Instructions (Addendum)
Cardioversion scheduled for Wednesday, December 13th  - Arrive at the [facility] and go to admitting at 11:30AM  -Do not eat or drink anything after midnight the night prior to your procedure.  - Take all your medication with a sip of water prior to arrival.  - You will not be able to drive home after your procedure.  Have your INR checked 12/4 and 12/8 -- have results faxed to [phone] Have INR checked the morning of 12/13.

## 2016-10-17 NOTE — Patient Instructions (Addendum)
Anticoagulation Dose Instructions as of 10/17/2016      Dorene Grebe Tue Wed Thu Fri Sat   New Dose 5 mg 2.5 mg 5 mg 2.5 mg 5 mg 2.5 mg 5 mg    Description   Take 1 and 1/2 tablets today - November 30th.  Then restart usual dose of warfarin 5mg  - take 1/2 tablet on mondays, wednesdays and fridays.  Take 1 tablet all other days.   INR was 1.8 today

## 2016-10-17 NOTE — Progress Notes (Signed)
Primary Care Physician: Redge Gainer, MD Referring Physician: Ignacia Bayley, NP Cardiologist: Dr. Neomia Dear Susan Fitzgerald is a 82 y.o. female with a h/o paroxysmal afib that is in the afib clinic for evaluation for recent increase in afib burden. She has  history of atrial fibrillation status post prior cardioversion and subsequent antiarrhythmic therapy with flecainide. She is anticoagulated with Coumadin. She also takes pindolol. Over the years, she has had occasional paroxysms of short-lived A. fib. Earlier this year, she had a period of atrial fibrillation, which is usually associated with dyspnea when it occurs, and she took an additional pindolol and this subsequently broke. She was doing well when she last saw Dr. Percival Spanish in June. Since early September however, she has noted more frequent elevated heart rates and irregular heart rhythm. When in A. fib, rates are typically in the mid 90s to low 100s. She records her blood pressures and heart rates on a regular basis and has shown these to me today. Over the past 2 months, she is mostly in the 80s to 90s though occasionally will have rates in the 70s and 60s. She does not think that she has been in atrial fibrillation all of the time over the past 2 months. As above, she does have dyspnea on exertion when in A. fib but denies chest pain, PND, orthopnea, dizziness, syncope, or early satiety. She does have some degree of mild chronic venous stasis and lower extremity swelling and recently taken off amlodipine.  On visit with me, she feels that she has been in afib consistently over the last two weeks. I reviewed her V/S and her heart rate has been elevated closer to 100 bpm. In SR, she has a heart rate closer to 60. Discussion with pt for cardioversion with continuation on flecainide or flecainide washout and change to tikosyn. Pt is also planning to have an epidural Tuesday and will have to stop coumadin tonight. She wants to pursue this prior  to any change in afib management. It was also discussed that coumadin will have to be therapeutic x 3-4 weeks prior to any change in therapy.  Pt returns to afib clinic 11/30, s/p epidural for back pain for which pt had to stop warfarin and as of yet, has become therapeutic since restarting drug. The injection was not that helpful and pt now thinks she may need a hip replacement after the first of the year. Options of  restoring SR were discussed again and she would like to proceed with cardioversion with flecainide to see if flecainide will continue to work for pt. She has had good success with this drug over the years and is not that willing to move on to other options yet. She is wanting to do this as soon as possible so she can then plan for hip surgery. She is aware that she cannot interrupt blood thinner for at least 30 days after cardioversion. If that fails, she would next be interested in [name], if she can afford drug.  She is aware that she will require TEE guided cardioversion since she will not be therapeutic 4 weeks prior to cardioversion.  Today, she denies symptoms of  chest pain,  orthopnea, PND, lower extremity edema, dizziness, presyncope, syncope, or neurologic sequela.  Mild c/o of fatigue, shortness of breath, palpitations. The patient is tolerating medications without difficulties and is otherwise without complaint today.   Past Medical History:  Diagnosis Date  . Arthritis   . Bradycardia 2014  .  Cataract   . Diverticulosis   . History of stress test    a. 04/09/13-nuclear stress-no ischemia low risk  . Hypertension   . Menopause   . Osteoporosis   . PAF (paroxysmal atrial fibrillation) ([place])    a. s/p prior DCCV; b. On flecainide & coumadin (CHA2DS2VASc = 4);  c. 03/2016 Echo: EF 55-60%, mod LVH, mod AI, mild to mod TR, PASP 21mmHg.  Marland Kitchen Pelvic fracture [facility])    Past Surgical History:  Procedure Laterality Date  . BIOPSY BREAST    . CATARACT EXTRACTION    . EYE SURGERY       Current Outpatient Prescriptions  Medication Sig Dispense Refill  . acetaminophen (TYLENOL) 500 MG tablet Take 500 mg by mouth as directed.    . calcium-vitamin D (OSCAL WITH D) 250-125 MG-UNIT per tablet Take 1 tablet by mouth 2 (two) times daily.    . diclofenac sodium (VOLTAREN) 1 % GEL Apply 2 g topically 4 (four) times daily. 100 g 2  . flecainide (TAMBOCOR) 150 MG tablet Take one-half tablet by  mouth two times daily 90 tablet 1  . folic acid (FOLVITE) 1 MG tablet Take 1 mg by mouth daily.    . furosemide (LASIX) 20 MG tablet Take 1 to 2 tablets by  mouth daily as needed 180 tablet 1  . lisinopril (PRINIVIL,ZESTRIL) 40 MG tablet Take 1 tablet (40 mg total) by mouth daily. 90 tablet 0  . methotrexate (RHEUMATREX) 2.5 MG tablet Take 4 tablets (10 mg total) by mouth once a week. 48 tablet 3  . Multiple Vitamin (MULTIVITAMIN) capsule Take 1 capsule by mouth daily.    . pindolol (VISKEN) 10 MG tablet Take 1 tablet (10 mg total) by mouth 2 (two) times daily. 180 tablet 2  . potassium chloride SA (K-DUR,KLOR-CON) 20 MEQ tablet Take 1 tablet (20 mEq total) by mouth daily. 90 tablet 0  . warfarin (COUMADIN) 5 MG tablet TAKE 1 TABLET BY MOUTH ONCE DAILY OR AS DIRECTED 90 tablet 0   No current facility-administered medications for this encounter.     Allergies  Allergen Reactions  . Benazepril Hcl Cough    Social History   Social History  . Marital status: Married    Spouse name: N/A  . Number of children: N/A  . Years of education: N/A   Occupational History  . Not on file.   Social History Main Topics  . Smoking status: Never Smoker  . Smokeless tobacco: Never Used  . Alcohol use No  . Drug use: No  . Sexual activity: Yes   Other Topics Concern  . Not on file   Social History Narrative  . No narrative on file    Family History  Problem Relation Age of Onset  . Stroke Mother     cerebral hemorrhage  . Heart disease Father     MI  . Heart attack Father   .  Vision loss Father   . Heart disease Brother   . Heart attack Brother   . Cancer Maternal Aunt     breast  . Cancer Brother   . Heart disease Brother   . Cancer Daughter     breast    ROS- All systems are reviewed and negative except as per the HPI above  Physical Exam: Vitals:   10/17/16 1006  BP: (!) 152/90  Weight: 145 lb (65.8 kg)  Height: 5\' 2"  (1.575 m)    GEN- The patient is well appearing, alert and  oriented x 3 today.   Head- normocephalic, atraumatic Eyes-  Sclera clear, conjunctiva pink Ears- hearing intact Oropharynx- clear Neck- supple, no JVP Lymph- no cervical lymphadenopathy Lungs- Clear to ausculation bilaterally, normal work of breathing Heart- Irregular rate and rhythm, no murmurs, rubs or gallops, PMI not laterally displaced GI- soft, NT, ND, + BS Extremities- no clubbing, cyanosis, or edema MS- no significant deformity or atrophy Skin- no rash or lesion Psych- euthymic mood, full affect Neuro- strength and sensation are intact  EKG-afib at 104 bpm, LAD, qrs int 94 ms, qtc 460 ms Epic records reviewed INR this am 1.8  Assessment and Plan: 1. Paroxysmal afib with persistent afib x 4 weeks Discussed cardioversion with continuation of flecainide or change in antiarrythmic to Tikosyn. I believe her age would make ablation less favorable and she would like to avoid this She would like to try cardioversion but will need TEE since she will not be not be therapeutic x 4 weeks. She wants to get this done so she can satisfy the 30 day no interruption of anticoagulant after DCCV, so she can get scheduled for hip surgery in January.  She had INR of 1.8 this am with coumadin adjusted for tonight and I will have her repeat on 12/4, 12/8 and will schedule cardioversion 12/13 if INR's therapeutic  We will also call her insurance company to get the cost of tikosyn, to see if can afford drug, if she fails dccv Therefore, she will continue on current dose of pindolol  and flecainide, but if fails cardioversion, then flecainide will be discontinued  Butch Penny C. [place], [facility] [address] [place], [place] [address] [phone]

## 2016-10-17 NOTE — Telephone Encounter (Signed)
Patient met with atrial fib clinic. They need INR checked 12/4, 12/8 and 12/13.  Need INR 2.0 or over.   Need to fax, call or send INR to Butch Penny at [facility].  Patient will take extra 1/2 tablets for next 2 days and then restart her usually warfarin dose.  Appt's made

## 2016-10-21 ENCOUNTER — Ambulatory Visit (INDEPENDENT_AMBULATORY_CARE_PROVIDER_SITE_OTHER): Payer: Medicare Other | Admitting: Pharmacist

## 2016-10-21 DIAGNOSIS — I482 Chronic atrial fibrillation, unspecified: Secondary | ICD-10-CM

## 2016-10-21 LAB — COAGUCHEK XS/INR WAIVED
INR: 2.3 — ABNORMAL HIGH (ref 0.9–1.1)
PROTHROMBIN TIME: 28.1 s

## 2016-10-23 ENCOUNTER — Encounter: Payer: Self-pay | Admitting: Pharmacist

## 2016-10-23 ENCOUNTER — Ambulatory Visit: Payer: Medicare Other | Admitting: Cardiology

## 2016-10-25 ENCOUNTER — Encounter: Payer: Self-pay | Admitting: Pharmacist Clinician (PhC)/ Clinical Pharmacy Specialist

## 2016-10-25 ENCOUNTER — Ambulatory Visit (INDEPENDENT_AMBULATORY_CARE_PROVIDER_SITE_OTHER): Payer: Medicare Other | Admitting: Pharmacist Clinician (PhC)/ Clinical Pharmacy Specialist

## 2016-10-25 DIAGNOSIS — I482 Chronic atrial fibrillation, unspecified: Secondary | ICD-10-CM

## 2016-10-25 LAB — COAGUCHEK XS/INR WAIVED
INR: 2.8 — ABNORMAL HIGH (ref 0.9–1.1)
Prothrombin Time: 33.6 s

## 2016-10-25 NOTE — Patient Instructions (Signed)
Anticoagulation Dose Instructions as of 10/25/2016      Dorene Grebe Tue Wed Thu Fri Sat   New Dose 5 mg 2.5 mg 5 mg 5 mg 5 mg 2.5 mg 5 mg    Description   Increase dose of warfarin 5mg  - take 1/2 tablet on mondays and fridays.  Take 1 tablet all other days.   INR was 2.8 today

## 2016-10-28 ENCOUNTER — Telehealth: Payer: Self-pay | Admitting: Family Medicine

## 2016-10-28 MED ORDER — GABAPENTIN 100 MG PO CAPS: 100.0000 mg | ORAL_CAPSULE | Freq: Every day | ORAL | 1 refills | Status: DC

## 2016-10-28 NOTE — Telephone Encounter (Signed)
Dr Laurance Flatten OK'd start of gabapentin 100mg  qhs - rx sent in to [facility] and patient notified.

## 2016-10-30 ENCOUNTER — Ambulatory Visit (HOSPITAL_COMMUNITY): Payer: Medicare Other | Admitting: Anesthesiology

## 2016-10-30 ENCOUNTER — Encounter (HOSPITAL_COMMUNITY)
Admission: RE | Disposition: A | Discharge status: Home / Self Care | Payer: Self-pay | Source: Ambulatory Visit | Attending: Cardiology

## 2016-10-30 ENCOUNTER — Encounter (HOSPITAL_COMMUNITY): Payer: Self-pay | Admitting: *Deleted

## 2016-10-30 ENCOUNTER — Ambulatory Visit (INDEPENDENT_AMBULATORY_CARE_PROVIDER_SITE_OTHER): Payer: Medicare Other | Admitting: Pharmacist

## 2016-10-30 ENCOUNTER — Ambulatory Visit (HOSPITAL_COMMUNITY)
Admission: RE | Admit: 2016-10-30 | Discharge: 2016-10-30 | Disposition: A | Discharge status: Home / Self Care | Payer: Medicare Other | Source: Ambulatory Visit | Attending: Cardiology | Admitting: Cardiology

## 2016-10-30 ENCOUNTER — Ambulatory Visit (HOSPITAL_BASED_OUTPATIENT_CLINIC_OR_DEPARTMENT_OTHER): Payer: Medicare Other

## 2016-10-30 DIAGNOSIS — Z803 Family history of malignant neoplasm of breast: Secondary | ICD-10-CM | POA: Insufficient documentation

## 2016-10-30 DIAGNOSIS — I481 Persistent atrial fibrillation: Secondary | ICD-10-CM | POA: Diagnosis not present

## 2016-10-30 DIAGNOSIS — Z7901 Long term (current) use of anticoagulants: Secondary | ICD-10-CM

## 2016-10-30 DIAGNOSIS — I482 Chronic atrial fibrillation, unspecified: Secondary | ICD-10-CM

## 2016-10-30 DIAGNOSIS — M069 Rheumatoid arthritis, unspecified: Secondary | ICD-10-CM | POA: Diagnosis not present

## 2016-10-30 DIAGNOSIS — Z791 Long term (current) use of non-steroidal anti-inflammatories (NSAID): Secondary | ICD-10-CM | POA: Insufficient documentation

## 2016-10-30 DIAGNOSIS — I351 Nonrheumatic aortic (valve) insufficiency: Secondary | ICD-10-CM

## 2016-10-30 DIAGNOSIS — Z9889 Other specified postprocedural states: Secondary | ICD-10-CM | POA: Insufficient documentation

## 2016-10-30 DIAGNOSIS — Z823 Family history of stroke: Secondary | ICD-10-CM | POA: Diagnosis not present

## 2016-10-30 DIAGNOSIS — Z79899 Other long term (current) drug therapy: Secondary | ICD-10-CM | POA: Diagnosis not present

## 2016-10-30 DIAGNOSIS — I480 Paroxysmal atrial fibrillation: Secondary | ICD-10-CM | POA: Insufficient documentation

## 2016-10-30 DIAGNOSIS — Z888 Allergy status to other drugs, medicaments and biological substances status: Secondary | ICD-10-CM | POA: Diagnosis not present

## 2016-10-30 DIAGNOSIS — I491 Atrial premature depolarization: Secondary | ICD-10-CM | POA: Insufficient documentation

## 2016-10-30 DIAGNOSIS — I10 Essential (primary) hypertension: Secondary | ICD-10-CM | POA: Insufficient documentation

## 2016-10-30 DIAGNOSIS — Z8249 Family history of ischemic heart disease and other diseases of the circulatory system: Secondary | ICD-10-CM | POA: Diagnosis not present

## 2016-10-30 DIAGNOSIS — Z821 Family history of blindness and visual loss: Secondary | ICD-10-CM | POA: Diagnosis not present

## 2016-10-30 HISTORY — PX: CARDIOVERSION: SHX1299

## 2016-10-30 HISTORY — PX: TEE WITHOUT CARDIOVERSION: SHX5443

## 2016-10-30 LAB — COAGUCHEK XS/INR WAIVED
INR: 2 — AB (ref 0.9–1.1)
PROTHROMBIN TIME: 24.2 s

## 2016-10-30 SURGERY — CARDIOVERSION
Anesthesia: General

## 2016-10-30 MED ORDER — HYDRALAZINE HCL 20 MG/ML IJ SOLN
10.0000 mg | Freq: Once | INTRAMUSCULAR | Status: AC
  Administered 2016-10-30: 10 mg via INTRAVENOUS

## 2016-10-30 MED ORDER — HYDRALAZINE HCL 20 MG/ML IJ SOLN
INTRAMUSCULAR | Status: AC
  Filled 2016-10-30: qty 1

## 2016-10-30 MED ORDER — SODIUM CHLORIDE 0.9 % IV SOLN
INTRAVENOUS | Status: DC
  Administered 2016-10-30: 13:00:00 via INTRAVENOUS

## 2016-10-30 MED ORDER — PROPOFOL 10 MG/ML IV BOLUS
INTRAVENOUS | Status: DC | PRN
  Administered 2016-10-30 (×5): 20 mg via INTRAVENOUS
  Administered 2016-10-30: 40 mg via INTRAVENOUS

## 2016-10-30 MED ORDER — LIDOCAINE 2% (20 MG/ML) 5 ML SYRINGE
INTRAMUSCULAR | Status: DC | PRN
  Administered 2016-10-30: 60 mg via INTRAVENOUS

## 2016-10-30 MED ORDER — BUTAMBEN-TETRACAINE-BENZOCAINE 2-2-14 % EX AERO
INHALATION_SPRAY | CUTANEOUS | Status: DC | PRN
  Administered 2016-10-30: 2 via TOPICAL

## 2016-10-30 NOTE — Progress Notes (Signed)
  Echocardiogram Echocardiogram Transesophageal has been performed.  Susan Fitzgerald 10/30/2016, 1:40 PM

## 2016-10-30 NOTE — Discharge Instructions (Signed)
Electrical Cardioversion, Care After °This sheet gives you information about how to care for yourself after your procedure. Your health care provider may also give you more specific instructions. If you have problems or questions, contact your health care provider. °What can I expect after the procedure? °After the procedure, it is common to have: °· Some redness on the skin where the shocks were given. °Follow these instructions at home: °· Do not drive for 24 hours if you were given a medicine to help you relax (sedative). °· Take over-the-counter and prescription medicines only as told by your health care provider. °· Ask your health care provider how to check your pulse. Check it often. °· Rest for 48 hours after the procedure or as told by your health care provider. °· Avoid or limit your caffeine use as told by your health care provider. °Contact a health care provider if: °· You feel like your heart is beating too quickly or your pulse is not regular. °· You have a serious muscle cramp that does not go away. °Get help right away if: °· You have discomfort in your chest. °· You are dizzy or you feel faint. °· You have trouble breathing or you are short of breath. °· Your speech is slurred. °· You have trouble moving an arm or leg on one side of your body. °· Your fingers or toes turn cold or blue. °This information is not intended to replace advice given to you by your health care provider. Make sure you discuss any questions you have with your health care provider. °Document Released: 08/25/2013 Document Revised: 06/07/2016 Document Reviewed: 05/10/2016 °Elsevier Interactive Patient Education © 2017 Elsevier Inc. ° °

## 2016-10-30 NOTE — Anesthesia Postprocedure Evaluation (Signed)
Anesthesia Post Note  Patient: Susan Fitzgerald  Procedure(s) Performed: Procedure(s) (LRB): CARDIOVERSION (N/A) TRANSESOPHAGEAL ECHOCARDIOGRAM (TEE) (N/A)  Patient location during evaluation: PACU Anesthesia Type: General Level of consciousness: awake and alert Pain management: pain level controlled Vital Signs Assessment: post-procedure vital signs reviewed and stable Respiratory status: spontaneous breathing, nonlabored ventilation and respiratory function stable Cardiovascular status: blood pressure returned to baseline and stable Postop Assessment: no signs of nausea or vomiting Anesthetic complications: no    Last Vitals:  Vitals:   10/30/16 1430 10/30/16 1435  BP: (!) 187/72 (!) 181/71  Pulse:    Resp: 16 13  Temp:      Last Pain:  Vitals:   10/30/16 1343  TempSrc: Oral                 Aadith Raudenbush,W. EDMOND

## 2016-10-30 NOTE — Interval H&P Note (Signed)
History and Physical Interval Note:  10/30/2016 1:07 PM  Susan Fitzgerald  has presented today for surgery, with the diagnosis of afib  The various methods of treatment have been discussed with the patient and family. After consideration of risks, benefits and other options for treatment, the patient has consented to  Procedure(s): CARDIOVERSION (N/A) TRANSESOPHAGEAL ECHOCARDIOGRAM (TEE) (N/A) as a surgical intervention .  The patient's history has been reviewed, patient examined, no change in status, stable for surgery.  I have reviewed the patient's chart and labs.  Questions were answered to the patient's satisfaction.     Russell Engelstad [employer]

## 2016-10-30 NOTE — Progress Notes (Signed)
VSS BP down to 165/63, 12 lead shows SR with frequent PACs. Discussed with Dr. Aundra Dubin, verbal to discharge home.

## 2016-10-30 NOTE — Anesthesia Preprocedure Evaluation (Addendum)
Anesthesia Evaluation  Patient identified by MRN, date of birth, ID band Patient awake    Reviewed: Allergy & Precautions, H&P , NPO status , Patient's Chart, lab work & pertinent test results, reviewed documented beta blocker date and time   Airway Mallampati: III  TM Distance: >3 FB Neck ROM: Full    Dental no notable dental hx. (+) Teeth Intact, Dental Advisory Given   Pulmonary neg pulmonary ROS,    Pulmonary exam normal breath sounds clear to auscultation       Cardiovascular hypertension, Pt. on medications and Pt. on home beta blockers + dysrhythmias Atrial Fibrillation  Rhythm:Irregular Rate:Normal     Neuro/Psych negative neurological ROS  negative psych ROS   GI/Hepatic negative GI ROS, Neg liver ROS,   Endo/Other  negative endocrine ROS  Renal/GU negative Renal ROS  negative genitourinary   Musculoskeletal  (+) Arthritis , Osteoarthritis,    Abdominal   Peds  Hematology negative hematology ROS (+)   Anesthesia Other Findings   Reproductive/Obstetrics negative OB ROS                            Anesthesia Physical Anesthesia Plan  ASA: III  Anesthesia Plan: General   Post-op Pain Management:    Induction: Intravenous  Airway Management Planned: Mask  Additional Equipment:   Intra-op Plan:   Post-operative Plan:   Informed Consent: I have reviewed the patients History and Physical, chart, labs and discussed the procedure including the risks, benefits and alternatives for the proposed anesthesia with the patient or authorized representative who has indicated his/her understanding and acceptance.   Dental advisory given  Plan Discussed with: CRNA  Anesthesia Plan Comments:         Anesthesia Quick Evaluation

## 2016-10-30 NOTE — Procedures (Signed)
Electrical Cardioversion Procedure Note ARSHIA SPELLMAN [id number] 03/21/1932  Procedure: Electrical Cardioversion Indications:  Atrial Fibrillation.  INR 2.0, no thrombus on TEE.   Procedure Details Consent: Risks of procedure as well as the alternatives and risks of each were explained to the (patient/caregiver).  Consent for procedure obtained. Time Out: Verified patient identification, verified procedure, site/side was marked, verified correct patient position, special equipment/implants available, medications/allergies/relevent history reviewed, required imaging and test results available.  Performed  Patient placed on cardiac monitor, pulse oximetry, supplemental oxygen as necessary.  Sedation given: Per anesthesiology Pacer pads placed anterior and posterior chest.  Cardioverted 1 time(s).  Cardioverted at [place].  Evaluation Findings: Post procedure EKG shows: NSR Complications: None Patient did tolerate procedure well.   Loralie Champagne 10/30/2016, 1:39 PM

## 2016-10-30 NOTE — CV Procedure (Signed)
Procedure: TEE  Sedation: Per anesthesiology  Indication: Atrial fibrillation  Findings: Please see echo section for full report.  The patient was in atrial fibrillation with RVR.  Normal LV size with EF 50%, diffuse hypokinesis.  Mildly dilated RV with mildly decreased systolic function.  Moderately dilated right atrium.  Mildly dilated left atrium with no LA appendage thrombus.  No PFO/ASD by color doppler.  There was moderate to severe tricuspid regurgitation with incomplete coaptation of the tricuspid valve.  Peak RV-RA gradient 32 mmHg.  Mild mitral regurgitation.  Trileaflet aortic valve with no stenosis.  There was mild to moderate aortic insufficiency.  Normal caliber aorta with grade III plaque in the descending thoracic aorta.    Impression: No LAA thrombus, may go for cardioversion.   Loralie Champagne 10/30/2016 1:39 PM

## 2016-10-30 NOTE — Progress Notes (Signed)
BP elevated, see flowsheet.  Notified Dr. Aundra Dubin, vo for hydralizine received and given IV.  Continue to monitor pt.

## 2016-10-30 NOTE — H&P (View-Only) (Signed)
Primary Care Physician: Susan Gainer, MD Referring Physician: Ignacia Bayley, NP Cardiologist: Dr. Neomia Fitzgerald Susan Fitzgerald is a 82 y.o. female with a h/o paroxysmal afib that is in the afib clinic for evaluation for recent increase in afib burden. She has  history of atrial fibrillation status post prior cardioversion and subsequent antiarrhythmic therapy with flecainide. She is anticoagulated with Coumadin. She also takes pindolol. Over the years, she has had occasional paroxysms of short-lived A. fib. Earlier this year, she had a period of atrial fibrillation, which is usually associated with dyspnea when it occurs, and she took an additional pindolol and this subsequently broke. She was doing well when she last saw Dr. Percival Fitzgerald in June. Since early September however, she has noted more frequent elevated heart rates and irregular heart rhythm. When in A. fib, rates are typically in the mid 90s to low 100s. She records her blood pressures and heart rates on a regular basis and has shown these to me today. Over the past 2 months, she is mostly in the 80s to 90s though occasionally will have rates in the 70s and 60s. She does not think that she has been in atrial fibrillation all of the time over the past 2 months. As above, she does have dyspnea on exertion when in A. fib but denies chest pain, PND, orthopnea, dizziness, syncope, or early satiety. She does have some degree of mild chronic venous stasis and lower extremity swelling and recently taken off amlodipine.  On visit with me, she feels that she has been in afib consistently over the last two weeks. I reviewed her V/S and her heart rate has been elevated closer to 100 bpm. In SR, she has a heart rate closer to 60. Discussion with pt for cardioversion with continuation on flecainide or flecainide washout and change to tikosyn. Pt is also planning to have an epidural Tuesday and will have to stop coumadin tonight. She wants to pursue this prior  to any change in afib management. It was also discussed that coumadin will have to be therapeutic x 3-4 weeks prior to any change in therapy.  Pt returns to afib clinic 11/30, s/p epidural for back pain for which pt had to stop warfarin and as of yet, has become therapeutic since restarting drug. The injection was not that helpful and pt now thinks she may need a hip replacement after the first of the year. Options of  restoring SR were discussed again and she would like to proceed with cardioversion with flecainide to see if flecainide will continue to work for pt. She has had good success with this drug over the years and is not that willing to move on to other options yet. She is wanting to do this as soon as possible so she can then plan for hip surgery. She is aware that she cannot interrupt blood thinner for at least 30 days after cardioversion. If that fails, she would next be interested in [name], if she can afford drug.  She is aware that she will require TEE guided cardioversion since she will not be therapeutic 4 weeks prior to cardioversion.  Today, she denies symptoms of  chest pain,  orthopnea, PND, lower extremity edema, dizziness, presyncope, syncope, or neurologic sequela.  Mild c/o of fatigue, shortness of breath, palpitations. The patient is tolerating medications without difficulties and is otherwise without complaint today.   Past Medical History:  Diagnosis Date  . Arthritis   . Bradycardia 2014  .  Cataract   . Diverticulosis   . History of stress test    a. 04/09/13-nuclear stress-no ischemia low risk  . Hypertension   . Menopause   . Osteoporosis   . PAF (paroxysmal atrial fibrillation) ([place])    a. s/p prior DCCV; b. On flecainide & coumadin (CHA2DS2VASc = 4);  c. 03/2016 Echo: EF 55-60%, mod LVH, mod AI, mild to mod TR, PASP 54mmHg.  Marland Kitchen Pelvic fracture [facility])    Past Surgical History:  Procedure Laterality Date  . BIOPSY BREAST    . CATARACT EXTRACTION    . EYE SURGERY       Current Outpatient Prescriptions  Medication Sig Dispense Refill  . acetaminophen (TYLENOL) 500 MG tablet Take 500 mg by mouth as directed.    . calcium-vitamin D (OSCAL WITH D) 250-125 MG-UNIT per tablet Take 1 tablet by mouth 2 (two) times daily.    . diclofenac sodium (VOLTAREN) 1 % GEL Apply 2 g topically 4 (four) times daily. 100 g 2  . flecainide (TAMBOCOR) 150 MG tablet Take one-half tablet by  mouth two times daily 90 tablet 1  . folic acid (FOLVITE) 1 MG tablet Take 1 mg by mouth daily.    . furosemide (LASIX) 20 MG tablet Take 1 to 2 tablets by  mouth daily as needed 180 tablet 1  . lisinopril (PRINIVIL,ZESTRIL) 40 MG tablet Take 1 tablet (40 mg total) by mouth daily. 90 tablet 0  . methotrexate (RHEUMATREX) 2.5 MG tablet Take 4 tablets (10 mg total) by mouth once a week. 48 tablet 3  . Multiple Vitamin (MULTIVITAMIN) capsule Take 1 capsule by mouth daily.    . pindolol (VISKEN) 10 MG tablet Take 1 tablet (10 mg total) by mouth 2 (two) times daily. 180 tablet 2  . potassium chloride SA (K-DUR,KLOR-CON) 20 MEQ tablet Take 1 tablet (20 mEq total) by mouth daily. 90 tablet 0  . warfarin (COUMADIN) 5 MG tablet TAKE 1 TABLET BY MOUTH ONCE DAILY OR AS DIRECTED 90 tablet 0   No current facility-administered medications for this encounter.     Allergies  Allergen Reactions  . Benazepril Hcl Cough    Social History   Social History  . Marital status: Married    Spouse name: N/A  . Number of children: N/A  . Years of education: N/A   Occupational History  . Not on file.   Social History Main Topics  . Smoking status: Never Smoker  . Smokeless tobacco: Never Used  . Alcohol use No  . Drug use: No  . Sexual activity: Yes   Other Topics Concern  . Not on file   Social History Narrative  . No narrative on file    Family History  Problem Relation Age of Onset  . Stroke Mother     cerebral hemorrhage  . Heart disease Father     MI  . Heart attack Father   .  Vision loss Father   . Heart disease Brother   . Heart attack Brother   . Cancer Maternal Aunt     breast  . Cancer Brother   . Heart disease Brother   . Cancer Daughter     breast    ROS- All systems are reviewed and negative except as per the HPI above  Physical Exam: Vitals:   10/17/16 1006  BP: (!) 152/90  Weight: 145 lb (65.8 kg)  Height: 5\' 2"  (1.575 m)    GEN- The patient is well appearing, alert and  oriented x 3 today.   Head- normocephalic, atraumatic Eyes-  Sclera clear, conjunctiva pink Ears- hearing intact Oropharynx- clear Neck- supple, no JVP Lymph- no cervical lymphadenopathy Lungs- Clear to ausculation bilaterally, normal work of breathing Heart- Irregular rate and rhythm, no murmurs, rubs or gallops, PMI not laterally displaced GI- soft, NT, ND, + BS Extremities- no clubbing, cyanosis, or edema MS- no significant deformity or atrophy Skin- no rash or lesion Psych- euthymic mood, full affect Neuro- strength and sensation are intact  EKG-afib at 104 bpm, LAD, qrs int 94 ms, qtc 460 ms Epic records reviewed INR this am 1.8  Assessment and Plan: 1. Paroxysmal afib with persistent afib x 4 weeks Discussed cardioversion with continuation of flecainide or change in antiarrythmic to Tikosyn. I believe her age would make ablation less favorable and she would like to avoid this She would like to try cardioversion but will need TEE since she will not be not be therapeutic x 4 weeks. She wants to get this done so she can satisfy the 30 day no interruption of anticoagulant after DCCV, so she can get scheduled for hip surgery in January.  She had INR of 1.8 this am with coumadin adjusted for tonight and I will have her repeat on 12/4, 12/8 and will schedule cardioversion 12/13 if INR's therapeutic  We will also call her insurance company to get the cost of tikosyn, to see if can afford drug, if she fails dccv Therefore, she will continue on current dose of pindolol  and flecainide, but if fails cardioversion, then flecainide will be discontinued  Butch Penny C. Ajit Errico, [facility] [address] [place], [place] [address] [phone]

## 2016-10-30 NOTE — Transfer of Care (Signed)
Immediate Anesthesia Transfer of Care Note  Patient: Susan Fitzgerald  Procedure(s) Performed: Procedure(s): CARDIOVERSION (N/A) TRANSESOPHAGEAL ECHOCARDIOGRAM (TEE) (N/A)  Patient Location: PACU and Endoscopy Unit  Anesthesia Type:General  Level of Consciousness: awake, alert , oriented and patient cooperative  Airway & Oxygen Therapy: Patient Spontanous Breathing  Post-op Assessment: Report given to RN, Post -op Vital signs reviewed and stable and Patient moving all extremities  Post vital signs: Reviewed and stable  Last Vitals:  Vitals:   10/30/16 1339 10/30/16 1343  BP:  (!) 165/64  Pulse:  64  Resp: (!) 21 11  Temp:  36.4 C    Last Pain:  Vitals:   10/30/16 1343  TempSrc: Oral         Complications: No apparent anesthesia complications

## 2016-11-01 ENCOUNTER — Encounter (HOSPITAL_COMMUNITY): Payer: Self-pay | Admitting: Cardiology

## 2016-11-05 ENCOUNTER — Encounter (HOSPITAL_COMMUNITY): Payer: Self-pay | Admitting: Nurse Practitioner

## 2016-11-05 ENCOUNTER — Ambulatory Visit (HOSPITAL_COMMUNITY)
Admission: RE | Admit: 2016-11-05 | Discharge: 2016-11-05 | Disposition: A | Discharge status: Home / Self Care | Payer: Medicare Other | Source: Ambulatory Visit | Attending: Nurse Practitioner | Admitting: Nurse Practitioner

## 2016-11-05 ENCOUNTER — Telehealth: Payer: Self-pay | Admitting: Cardiology

## 2016-11-05 VITALS — BP 180/80 | HR 71 | Ht 62.0 in | Wt 138.4 lb

## 2016-11-05 DIAGNOSIS — R0609 Other forms of dyspnea: Secondary | ICD-10-CM | POA: Diagnosis not present

## 2016-11-05 DIAGNOSIS — I4581 Long QT syndrome: Secondary | ICD-10-CM | POA: Diagnosis not present

## 2016-11-05 DIAGNOSIS — I10 Essential (primary) hypertension: Secondary | ICD-10-CM | POA: Diagnosis not present

## 2016-11-05 DIAGNOSIS — I480 Paroxysmal atrial fibrillation: Secondary | ICD-10-CM | POA: Diagnosis not present

## 2016-11-05 DIAGNOSIS — M87051 Idiopathic aseptic necrosis of right femur: Secondary | ICD-10-CM | POA: Diagnosis not present

## 2016-11-05 DIAGNOSIS — I481 Persistent atrial fibrillation: Secondary | ICD-10-CM | POA: Diagnosis not present

## 2016-11-05 DIAGNOSIS — Z7901 Long term (current) use of anticoagulants: Secondary | ICD-10-CM | POA: Diagnosis not present

## 2016-11-05 DIAGNOSIS — I4819 Other persistent atrial fibrillation: Secondary | ICD-10-CM

## 2016-11-05 MED ORDER — AMLODIPINE BESYLATE 5 MG PO TABS: 2.5000 mg | ORAL_TABLET | Freq: Every day | ORAL | Status: DC

## 2016-11-05 NOTE — Telephone Encounter (Signed)
Left message for patient to call.  She needs appt with hochrein or PA in 1-2 weeks per Roderic Palau.  This is to discuss ekg changes.

## 2016-11-05 NOTE — Patient Instructions (Signed)
Your physician has recommended you make the following change in your medication:  1)Restart norvasc 2.5mg  once a day  Scheduler will be in touch with you regarding appointment with Dr. Percival Spanish.

## 2016-11-05 NOTE — Progress Notes (Addendum)
Primary Care Physician: Redge Gainer, MD Referring Physician: Ignacia Bayley, NP Cardiologist: Dr. Neomia Dear Susan Fitzgerald is a 82 y.o. female with a h/o paroxysmal afib that is in the afib clinic for evaluation for recent increase in afib burden. She has  history of atrial fibrillation status post prior cardioversion and subsequent antiarrhythmic therapy with flecainide. She is anticoagulated with Coumadin. She also takes pindolol. Over the years, she has had occasional paroxysms of short-lived A. fib. Earlier this year, she had a period of atrial fibrillation, which is usually associated with dyspnea when it occurs, and she took an additional pindolol and this subsequently broke. She was doing well when she last saw Dr. Percival Spanish in June. Since early September however, she has noted more frequent elevated heart rates and irregular heart rhythm. When in A. fib, rates are typically in the mid 90s to low 100s. She records her blood pressures and heart rates on a regular basis and has shown these to me today. Over the past 2 months, she is mostly in the 80s to 90s though occasionally will have rates in the 70s and 60s. She does not think that she has been in atrial fibrillation all of the time over the past 2 months. As above, she does have dyspnea on exertion when in A. fib but denies chest pain, PND, orthopnea, dizziness, syncope, or early satiety. She does have some degree of mild chronic venous stasis and lower extremity swelling and recently taken off amlodipine.  On visit with me, she feels that she has been in afib consistently over the last two weeks. I reviewed her V/S and her heart rate has been elevated closer to 100 bpm. In SR, she has a heart rate closer to 60. Discussion with pt for cardioversion with continuation on flecainide or flecainide washout and change to tikosyn. Pt is also planning to have an epidural Tuesday and will have to stop coumadin tonight. She wants to pursue this prior  to any change in afib management. It was also discussed that coumadin will have to be therapeutic x 3-4 weeks prior to any change in therapy.  Pt returns to afib clinic 11/30, s/p epidural for back pain for which pt had to stop warfarin and as of yet, has become therapeutic since restarting drug. The injection was not that helpful and pt now thinks she may need a hip replacement after the first of the year. Options of  restoring SR were discussed again and she would like to proceed with cardioversion with flecainide to see if flecainide will continue to work for pt. She has had good success with this drug over the years and is not that willing to move on to other options yet. She is wanting to do this as soon as possible so she can then plan for hip surgery. She is aware that she cannot interrupt blood thinner for at least 30 days after cardioversion. If that fails, she would next be interested in [name], if she can afford drug.  She is aware that she will require TEE guided cardioversion since she will not be therapeutic 4 weeks prior to cardioversion.  Returns to afib clinic one week s/p cardioversion. She is in SR but is still having exertional dyspnea. She was hoping that return to SR would improve this. EKG shows more pronounced T waves in the ant/inf leads. She denies exertional chest pain. Her BP was elevated at time of cardioversion and she received IV meds. At home,  it continues to be elevated. It was around 532 systolic on arrival, rechecked at 180/80. She was on 2.5 mg of amlodipine which was stopped in November for mild ankle swelling. She continues to have mild ankle swelling off drug and occasionally worse in rt leg which she will be seeing a orthopedic surgeon re today and may be pending surgery. Gabapentin was added since last visit but pt has not noted too much change in leg discomfort. Qtc is also prolonged today, unsure why, previous ekg's reviewed and qtc was in range. No other new meds to  explain change. She does use prn lasix. BP rechecked at 180/80. Echo in May showed moderate aortic regurg, normal EF with moderate diastolic dysfunction, pulmonary pressure increased at 53 mm Hg  Today, she denies symptoms of  chest pain,  orthopnea, PND,  dizziness, presyncope, syncope, or neurologic sequela.   Positive for exertional shortness of breath, mild LEE, rt leg discomfort. The patient is tolerating medications without difficulties and is otherwise without complaint today.   Past Medical History:  Diagnosis Date  . Arthritis   . Bradycardia 2014  . Cataract   . Diverticulosis   . History of stress test    a. 04/09/13-nuclear stress-no ischemia low risk  . Hypertension   . Menopause   . Osteoporosis   . PAF (paroxysmal atrial fibrillation) ([place])    a. s/p prior DCCV; b. On flecainide & coumadin (CHA2DS2VASc = 4);  c. 03/2016 Echo: EF 55-60%, mod LVH, mod AI, mild to mod TR, PASP 51mmHg.  Marland Kitchen Pelvic fracture [facility])    Past Surgical History:  Procedure Laterality Date  . BIOPSY BREAST    . CARDIOVERSION N/A 10/30/2016   Procedure: CARDIOVERSION;  Surgeon: Larey Dresser, MD;  Location: [place];  Service: Cardiovascular;  Laterality: N/A;  . CATARACT EXTRACTION    . EYE SURGERY    . TEE WITHOUT CARDIOVERSION N/A 10/30/2016   Procedure: TRANSESOPHAGEAL ECHOCARDIOGRAM (TEE);  Surgeon: Larey Dresser, MD;  Location: [facility] ENDOSCOPY;  Service: Cardiovascular;  Laterality: N/A;    Current Outpatient Prescriptions  Medication Sig Dispense Refill  . acetaminophen (TYLENOL) 500 MG tablet Take 500 mg by mouth as directed.    . calcium-vitamin D (OSCAL WITH D) 250-125 MG-UNIT per tablet Take 1 tablet by mouth 2 (two) times daily.    . diclofenac sodium (VOLTAREN) 1 % GEL Apply 2 g topically 4 (four) times daily. 100 g 2  . flecainide (TAMBOCOR) 150 MG tablet Take one-half tablet by  mouth two times daily 90 tablet 1  . folic acid (FOLVITE) 1 MG tablet Take 1 mg by mouth daily.    .  furosemide (LASIX) 20 MG tablet Take 1 to 2 tablets by  mouth daily as needed 180 tablet 1  . gabapentin (NEURONTIN) 100 MG capsule Take 1 capsule (100 mg total) by mouth at bedtime. 30 capsule 1  . lisinopril (PRINIVIL,ZESTRIL) 40 MG tablet Take 1 tablet (40 mg total) by mouth daily. 90 tablet 0  . methotrexate (RHEUMATREX) 2.5 MG tablet Take 4 tablets (10 mg total) by mouth once a week. 48 tablet 3  . Multiple Vitamin (MULTIVITAMIN) capsule Take 1 capsule by mouth daily.    . pindolol (VISKEN) 10 MG tablet Take 1 tablet (10 mg total) by mouth 2 (two) times daily. 180 tablet 2  . potassium chloride SA (K-DUR,KLOR-CON) 20 MEQ tablet Take 1 tablet (20 mEq total) by mouth daily. 90 tablet 0  . warfarin (COUMADIN) 5 MG tablet  TAKE 1 TABLET BY MOUTH ONCE DAILY OR AS DIRECTED 90 tablet 0  . amLODipine (NORVASC) 5 MG tablet Take 0.5 tablets (2.5 mg total) by mouth daily.     No current facility-administered medications for this encounter.     Allergies  Allergen Reactions  . Benazepril Hcl Cough    Social History   Social History  . Marital status: Married    Spouse name: N/A  . Number of children: N/A  . Years of education: N/A   Occupational History  . Not on file.   Social History Main Topics  . Smoking status: Never Smoker  . Smokeless tobacco: Never Used  . Alcohol use No  . Drug use: No  . Sexual activity: Yes   Other Topics Concern  . Not on file   Social History Narrative  . No narrative on file    Family History  Problem Relation Age of Onset  . Stroke Mother     cerebral hemorrhage  . Heart disease Father     MI  . Heart attack Father   . Vision loss Father   . Heart disease Brother   . Heart attack Brother   . Cancer Maternal Aunt     breast  . Cancer Brother   . Heart disease Brother   . Cancer Daughter     breast    ROS- All systems are reviewed and negative except as per the HPI above  Physical Exam: Vitals:   11/05/16 1014 11/05/16 1122  BP:  (!) 208/100 (!) 180/80  Pulse: 71   Weight: 138 lb 6.4 oz (62.8 kg)   Height: 5\' 2"  (1.575 m)     GEN- The patient is well appearing, alert and oriented x 3 today.   Head- normocephalic, atraumatic Eyes-  Sclera clear, conjunctiva pink Ears- hearing intact Oropharynx- clear Neck- supple, no JVP Lymph- no cervical lymphadenopathy Lungs- Clear to ausculation bilaterally, normal work of breathing Heart-regular rate and rhythm, no murmurs, rubs or gallops, PMI not laterally displaced GI- soft, NT, ND, + BS Extremities- no clubbing, cyanosis, or mild LLE, rt greater than left. MS- no significant deformity or atrophy Skin- no rash or lesion Psych- euthymic mood, full affect Neuro- strength and sensation are intact  EKG- SR at 71 bpm, pr int 194 ms, qrs int 100 ms, qtc 523 ms Epic records reviewed Echo- 03/18/16 LV EF: 55% -   60%  ------------------------------------------------------------------- Indications:      SOB (R06.00).  ------------------------------------------------------------------- Study Conclusions  - Left ventricle: The cavity size was normal. Wall thickness was   increased in a pattern of moderate LVH. Systolic function was   normal. The estimated ejection fraction was in the range of 55%   to 60%. - Aortic valve: There was moderate regurgitation. - Tricuspid valve: There was mild-moderate regurgitation. - Pulmonary arteries: Systolic pressure was moderately increased.  Assessment and Plan: 1. Paroxysmal afib with persistent afib x 4 weeks Successful cardioversion but pt continues to feel exertional dyspnea. She does have structural changes to echo which may help explain but has not had s stress test and with ekg T wave changes and possible pending orthopedic surgery, I feel it would be best for her to f/u with Dr. Percival Spanish to see if pt would benefit from a stress test. Continue warfarin with INR checks thru PCP.  2. HTN Poorly manged BP much better  controlled on 2.5 mg of amlodipine in past and pt did not notice much difference in LLE off  drug x last 6-8 weeks. She will resume drug and f/u BP management when seen by cardiology.  3. QTC prolongation I do not see this has been an issue in the past and flecainide dose has been stable and usually does not prolong the QTc. Pt states no new meds other than gabapentin which does not prolong QTC. I will review with Dr. Rayann Heman to see if any med changes will be needed.  Appointment has been requested with Dr. Percival Spanish in the next 1-2 weeks afib clinic as needed  [place] [place], [place] [address] [phone]   ADDENDUM- 12/20- I did discuss prolonged qtc with Dr. Rayann Heman and will decrease flecainide to 50 mg bid. I am hesitate to do this due her just having cardioversion and may throw her back into afib, and if that is the case, she will need another antiarrythmic. Will bring back next week for repeat EKG.

## 2016-11-06 NOTE — Addendum Note (Signed)
Encounter addended by: Sherran Needs, NP on: 11/06/2016  1:17 PM<BR>    Actions taken: Sign clinical note

## 2016-11-07 ENCOUNTER — Other Ambulatory Visit (HOSPITAL_COMMUNITY): Payer: Self-pay | Admitting: *Deleted

## 2016-11-07 MED ORDER — FLECAINIDE ACETATE 100 MG PO TABS: 50.0000 mg | ORAL_TABLET | Freq: Two times a day (BID) | ORAL | 3 refills | Status: DC

## 2016-11-08 ENCOUNTER — Telehealth: Payer: Self-pay | Admitting: *Deleted

## 2016-11-08 NOTE — Telephone Encounter (Signed)
Requesting surgical clearance:   1. Type of surgery: (R) THA  2. Surgeon: UnKnown  3. Surgical date: Pending  4. Medications that need to be help: Warfarin  5. [facility]: (P) [phone] (P) [phone]  Pt saw you last on 04/18/2016, Is pt cleared for surgery, if so how long can Warfarin be held?

## 2016-11-11 ENCOUNTER — Other Ambulatory Visit: Payer: Self-pay | Admitting: Family Medicine

## 2016-11-12 DIAGNOSIS — H578 Other specified disorders of eye and adnexa: Secondary | ICD-10-CM | POA: Diagnosis not present

## 2016-11-12 DIAGNOSIS — S0500XA Injury of conjunctiva and corneal abrasion without foreign body, unspecified eye, initial encounter: Secondary | ICD-10-CM | POA: Diagnosis not present

## 2016-11-12 NOTE — Telephone Encounter (Signed)
She needs an office appt before clearance.

## 2016-11-13 ENCOUNTER — Encounter (HOSPITAL_COMMUNITY): Payer: Self-pay | Admitting: Nurse Practitioner

## 2016-11-13 ENCOUNTER — Ambulatory Visit (HOSPITAL_COMMUNITY)
Admission: RE | Admit: 2016-11-13 | Discharge: 2016-11-13 | Disposition: A | Discharge status: Home / Self Care | Payer: Medicare Other | Source: Ambulatory Visit | Attending: Nurse Practitioner | Admitting: Nurse Practitioner

## 2016-11-13 DIAGNOSIS — I4891 Unspecified atrial fibrillation: Secondary | ICD-10-CM | POA: Insufficient documentation

## 2016-11-13 NOTE — Progress Notes (Addendum)
Pt in for repeat EKG and BP since decreasing Flecainide and starting amlodipine.  Pt BP today 136/82 and EKG to be reviewed by Roderic Palau, NP  EKG shows Afib at 109 bpm, she is surprised that she is back in afib,  was unaware. She continued to c/o shortness of breath after cardioversion, when I saw her in SR. Her flecainide dose was decreased last visit, after consultation with Dr. Rayann Heman for prolonged Qt interval without any other cause to explain and unfortunately has returned to afib . BP/HR readings from home show well rate controlled.  She does not want to restore SR right now as she is anxious to get her hip replaced which I think is contributing more to her lifestyle issues than the afib is currently. She will stop flecainide which I think has run its course, and f/u with Dr. Percival Spanish, for cardiac clearance with pending hip surgery. I think for now, if we can make sure she is well rate controlled,  for her surgery,then later she can try to restore SR with another antiarrythmic or possible ablation.

## 2016-11-13 NOTE — Patient Instructions (Signed)
Your physician has recommended you make the following change in your medication:  1)Stop flecainide  Monitor blood pressure/heart rates at home

## 2016-11-14 ENCOUNTER — Inpatient Hospital Stay (HOSPITAL_COMMUNITY): Admission: RE | Admit: 2016-11-14 | Payer: Medicare Other | Source: Ambulatory Visit | Admitting: Nurse Practitioner

## 2016-11-15 ENCOUNTER — Ambulatory Visit (INDEPENDENT_AMBULATORY_CARE_PROVIDER_SITE_OTHER): Payer: Medicare Other | Admitting: Pharmacist

## 2016-11-15 DIAGNOSIS — Z7901 Long term (current) use of anticoagulants: Secondary | ICD-10-CM | POA: Diagnosis not present

## 2016-11-15 DIAGNOSIS — I482 Chronic atrial fibrillation, unspecified: Secondary | ICD-10-CM

## 2016-11-15 LAB — COAGUCHEK XS/INR WAIVED
INR: 2.5 — ABNORMAL HIGH (ref 0.9–1.1)
Prothrombin Time: 29.8 s

## 2016-11-15 NOTE — Patient Instructions (Signed)
Anticoagulation Dose Instructions as of 11/15/2016      Dorene Grebe Tue Wed Thu Fri Sat   New Dose 5 mg 2.5 mg 5 mg 5 mg 5 mg 2.5 mg 5 mg    Description   Continue current dose of warfarin 5mg  - take 1/2 tablet on mondays and fridays.  Take 1 tablet all other days.   INR was 2.5 today

## 2016-11-16 ENCOUNTER — Other Ambulatory Visit: Payer: Self-pay | Admitting: Family Medicine

## 2016-11-18 DIAGNOSIS — J8410 Pulmonary fibrosis, unspecified: Secondary | ICD-10-CM

## 2016-11-18 HISTORY — DX: Pulmonary fibrosis, unspecified: J84.10

## 2016-11-19 ENCOUNTER — Telehealth (HOSPITAL_COMMUNITY): Payer: Self-pay | Admitting: *Deleted

## 2016-11-19 MED ORDER — PINDOLOL 10 MG PO TABS: 15.0000 mg | ORAL_TABLET | Freq: Two times a day (BID) | ORAL | 2 refills | Status: DC

## 2016-11-19 NOTE — Telephone Encounter (Signed)
Pt called in stating since stopping flecainide her HR has started to trend up. It is ranging from 90-120s. BP 129/84. Discussed with Roderic Palau NP will increase pindolol to 15mg  BID and has follow up with Dr. Percival Spanish on Friday. Pt verbalized understanding and will call back if further issues.

## 2016-11-21 NOTE — Progress Notes (Signed)
HPI The patient presents for evaluation of atrial fibrillation. She was cardioverted.  However, she developed recurrent atrial fib.  She had her flecainide does reduced because of a prolonged QT interval.  The flecainide was subsequently discontinued as she had reverted to atrial fib.   She is not wanting further cardioversion as she is wanting to have hip surgery.  She is here for preop evaluation.  She's not sure that she's going to have hip surgery yet but she's leaning that way. She also might need back surgery. She does feel the palpitations. She thinks is a little more short of breath and fibrillation and she was when she wasn't in this rhythm. She's not had any presyncope or syncope. She does know that her heart rate is elevated I will do blood pressure and heart rate diary. It actually was fairly well controlled though is high today in the office. She's not having any chest pressure, neck or arm discomfort. She's had no PND or orthopnea. She's had no chest pressure, neck or arm discomfort.   Allergies  Allergen Reactions  . Benazepril Hcl Cough    Current Outpatient Prescriptions  Medication Sig Dispense Refill  . acetaminophen (TYLENOL) 500 MG tablet Take 500 mg by mouth as directed.    Marland Kitchen amLODipine (NORVASC) 5 MG tablet TAKE 1 TABLET BY MOUTH  DAILY 90 tablet 1  . calcium-vitamin D (OSCAL WITH D) 250-125 MG-UNIT per tablet Take 1 tablet by mouth 2 (two) times daily.    . cephALEXin (KEFLEX) 500 MG capsule Take 1 capsule by mouth 2 (two) times daily.    . diclofenac sodium (VOLTAREN) 1 % GEL Apply 2 g topically 4 (four) times daily. 735 g 2  . folic acid (FOLVITE) 1 MG tablet Take 1 mg by mouth daily.    . furosemide (LASIX) 20 MG tablet Take 1 to 2 tablets by  mouth daily as needed 180 tablet 1  . gabapentin (NEURONTIN) 100 MG capsule Take 1 capsule (100 mg total) by mouth at bedtime. 30 capsule 1  . lisinopril (PRINIVIL,ZESTRIL) 40 MG tablet TAKE 1 TABLET BY MOUTH  DAILY 90 tablet  0  . methotrexate (RHEUMATREX) 2.5 MG tablet Take 4 tablets (10 mg total) by mouth once a week. 48 tablet 3  . Multiple Vitamin (MULTIVITAMIN) capsule Take 1 capsule by mouth daily.    . pindolol (VISKEN) 10 MG tablet Take 1.5 tablets (15 mg total) by mouth 2 (two) times daily. 180 tablet 2  . potassium chloride SA (K-DUR,KLOR-CON) 20 MEQ tablet TAKE 1 TABLET BY MOUTH  DAILY 90 tablet 0  . warfarin (COUMADIN) 5 MG tablet TAKE 1 TABLET BY MOUTH ONCE DAILY OR AS DIRECTED 90 tablet 0   No current facility-administered medications for this visit.     Past Medical History:  Diagnosis Date  . Arthritis   . Bradycardia 2014  . Cataract   . Diverticulosis   . History of stress test    a. 04/09/13-nuclear stress-no ischemia low risk  . Hypertension   . Menopause   . Osteoporosis   . PAF (paroxysmal atrial fibrillation) ([place])    a. s/p prior DCCV; b. On flecainide & coumadin (CHA2DS2VASc = 4);  c. 03/2016 Echo: EF 55-60%, mod LVH, mod AI, mild to mod TR, PASP 24mmHg.  Marland Kitchen Pelvic fracture [facility])     Past Surgical History:  Procedure Laterality Date  . BIOPSY BREAST    . CARDIOVERSION N/A 10/30/2016   Procedure: CARDIOVERSION;  Surgeon: Kirk Ruths  Claris Gladden, MD;  Location: [place];  Service: Cardiovascular;  Laterality: N/A;  . CATARACT EXTRACTION    . EYE SURGERY    . TEE WITHOUT CARDIOVERSION N/A 10/30/2016   Procedure: TRANSESOPHAGEAL ECHOCARDIOGRAM (TEE);  Surgeon: Larey Dresser, MD;  Location: [place];  Service: Cardiovascular;  Laterality: N/A;    ROS:  .As stated in the HPI and negative for all other systems.  PHYSICAL EXAM BP 132/80 (BP Location: Left Arm, Patient Position: Sitting, Cuff Size: Normal)   Pulse (!) 132   Wt 137 lb 4 oz (62.3 kg)   BMI 25.10 kg/m  GENERAL:  Well appearing HEENT:  Pupils equal round and reactive, fundi not visualized, oral mucosa unremarkable NECK:  No jugular venous distention, waveform within normal limits, carotid upstroke brisk and  symmetric, no bruits, no thyromegaly LYMPHATICS:  No cervical, inguinal adenopathy LUNGS:  Clear to auscultation bilaterally BACK:  No CVA tenderness CHEST:  Unremarkable HEART:  PMI not displaced or sustained, the problem PAD and S2 within normal limits, no S3, no S4, no clicks, no rubs, no murmurs.  ABD:  Flat, positive bowel sounds normal in frequency in pitch, positive midline bruits, no rebound, no guarding, no midline pulsatile mass, no hepatomegaly, no splenomegaly EXT:  2 plus pulses throughout, no edema, no cyanosis no clubbing, chronic venous stasis changes    EKG:  Atrial fibrillation, rate 132, left axis deviation, poor anterior R wave progression, no acute ST-T wave changes.  11/22/2016    ASSESSMENT AND PLAN  HTN:  The blood pressure is at target. No change in medications is indicated. We will continue with therapeutic lifestyle changes (TLC).  ATRIAL FIB:  She's going to remain in atrial fibrillation with anticoagulation for now. Ms. JALISIA PUCHALSKI has a CHA2DS2 - VASc score of 4 with a risk of stroke of 4%.  She is going to remain on warfarin. She needs better heart rate control. I will stop her Norvasc and add Cardizem 30 mg every 8 hours. I like to come to come back to the [facility] in one week to have further med titration.

## 2016-11-22 ENCOUNTER — Encounter: Payer: Self-pay | Admitting: Cardiology

## 2016-11-22 ENCOUNTER — Ambulatory Visit (INDEPENDENT_AMBULATORY_CARE_PROVIDER_SITE_OTHER): Payer: Medicare Other | Admitting: Cardiology

## 2016-11-22 ENCOUNTER — Telehealth: Payer: Self-pay | Admitting: Cardiology

## 2016-11-22 VITALS — BP 132/80 | HR 132 | Wt 137.2 lb

## 2016-11-22 DIAGNOSIS — I481 Persistent atrial fibrillation: Secondary | ICD-10-CM | POA: Diagnosis not present

## 2016-11-22 DIAGNOSIS — I4819 Other persistent atrial fibrillation: Secondary | ICD-10-CM

## 2016-11-22 MED ORDER — DILTIAZEM HCL 30 MG PO TABS: 30.0000 mg | ORAL_TABLET | Freq: Three times a day (TID) | ORAL | 6 refills | Status: DC

## 2016-11-22 NOTE — Telephone Encounter (Signed)
New Message   Pt calling & has some questions regarding prescription. Wants a call back from nurse.  diltiazem (CARDIZEM) 30 MG tablet

## 2016-11-22 NOTE — Telephone Encounter (Signed)
Returned call to patient-pt reports she was suppose to start on Cardizem today (OV today with Dr. Percival Spanish) and the [facility] in [place] is out of the medication.  Request for [place] to call [facility] or [place] and see if they have medication.  [facility] they do have cardizem 30mg  tablets.  New rx sent to this location. Pt made aware and verbalized understanding.

## 2016-11-22 NOTE — Patient Instructions (Signed)
Medication Instructions:  STOP-Amlodipine  START- Cardizem 30 mg take 1 tablets every 8 hours  Labwork: None Ordered  Testing/Procedures: None Ordered  Follow-Up: Your physician recommends that you schedule a follow-up appointment in: 1 Week with Roderic Palau   Any Other Special Instructions Will Be Listed Below (If Applicable).   If you need a refill on your cardiac medications before your next appointment, please call your pharmacy.

## 2016-11-24 ENCOUNTER — Encounter: Payer: Self-pay | Admitting: Cardiology

## 2016-11-25 DIAGNOSIS — M150 Primary generalized (osteo)arthritis: Secondary | ICD-10-CM | POA: Diagnosis not present

## 2016-11-25 DIAGNOSIS — R7989 Other specified abnormal findings of blood chemistry: Secondary | ICD-10-CM | POA: Diagnosis not present

## 2016-11-25 DIAGNOSIS — M5030 Other cervical disc degeneration, unspecified cervical region: Secondary | ICD-10-CM | POA: Diagnosis not present

## 2016-11-25 DIAGNOSIS — M5136 Other intervertebral disc degeneration, lumbar region: Secondary | ICD-10-CM | POA: Diagnosis not present

## 2016-11-25 DIAGNOSIS — M0579 Rheumatoid arthritis with rheumatoid factor of multiple sites without organ or systems involvement: Secondary | ICD-10-CM | POA: Diagnosis not present

## 2016-11-29 ENCOUNTER — Ambulatory Visit (HOSPITAL_COMMUNITY)
Admission: RE | Admit: 2016-11-29 | Discharge: 2016-11-29 | Disposition: A | Discharge status: Home / Self Care | Payer: Medicare Other | Source: Ambulatory Visit | Attending: Nurse Practitioner | Admitting: Nurse Practitioner

## 2016-11-29 VITALS — BP 130/68 | HR 97 | Ht 62.0 in | Wt 138.0 lb

## 2016-11-29 DIAGNOSIS — I4581 Long QT syndrome: Secondary | ICD-10-CM | POA: Diagnosis not present

## 2016-11-29 DIAGNOSIS — I480 Paroxysmal atrial fibrillation: Secondary | ICD-10-CM | POA: Diagnosis not present

## 2016-11-29 DIAGNOSIS — I481 Persistent atrial fibrillation: Secondary | ICD-10-CM | POA: Insufficient documentation

## 2016-11-29 DIAGNOSIS — Z7901 Long term (current) use of anticoagulants: Secondary | ICD-10-CM | POA: Insufficient documentation

## 2016-11-29 DIAGNOSIS — I10 Essential (primary) hypertension: Secondary | ICD-10-CM | POA: Diagnosis not present

## 2016-11-29 DIAGNOSIS — Z79899 Other long term (current) drug therapy: Secondary | ICD-10-CM | POA: Insufficient documentation

## 2016-11-29 DIAGNOSIS — I4819 Other persistent atrial fibrillation: Secondary | ICD-10-CM

## 2016-11-29 DIAGNOSIS — I4891 Unspecified atrial fibrillation: Secondary | ICD-10-CM | POA: Diagnosis present

## 2016-11-29 MED ORDER — PINDOLOL 10 MG PO TABS: ORAL_TABLET | ORAL | 2 refills | Status: DC

## 2016-11-29 NOTE — Patient Instructions (Signed)
Your physician has recommended you make the following change in your medication:  1)increase pindolol -- 2 tablets in the morning and 1.5 tablets in the evening

## 2016-11-29 NOTE — Progress Notes (Signed)
Primary Care Physician: Redge Gainer, MD Referring Physician: Ignacia Bayley, NP Cardiologist: Dr. Neomia Dear Susan Fitzgerald is a 83 y.o. female with a h/o paroxysmal afib that is in the afib clinic for evaluation for recent increase in afib burden. She has  history of atrial fibrillation status post prior cardioversion and subsequent antiarrhythmic therapy with flecainide. She is anticoagulated with Coumadin. She also takes pindolol. Over the years, she has had occasional paroxysms of short-lived A. fib. Earlier this year, she had a period of atrial fibrillation, which is usually associated with dyspnea when it occurs, and she took an additional pindolol and this subsequently broke. She was doing well when she last saw Dr. Percival Spanish in June. Since early September however, she has noted more frequent elevated heart rates and irregular heart rhythm. When in A. fib, rates are typically in the mid 90s to low 100s. She records her blood pressures and heart rates on a regular basis and has shown these to me today. Over the past 2 months, she is mostly in the 80s to 90s though occasionally will have rates in the 70s and 60s. She does not think that she has been in atrial fibrillation all of the time over the past 2 months. As above, she does have dyspnea on exertion when in A. fib but denies chest pain, PND, orthopnea, dizziness, syncope, or early satiety. She does have some degree of mild chronic venous stasis and lower extremity swelling and recently taken off amlodipine.  On visit with me, she feels that she has been in afib consistently over the last two weeks. I reviewed her V/S and her heart rate has been elevated closer to 100 bpm. In SR, she has a heart rate closer to 60. Discussion with pt for cardioversion with continuation on flecainide or flecainide washout and change to tikosyn. Pt is also planning to have an epidural Tuesday and will have to stop coumadin tonight. She wants to pursue this prior  to any change in afib management. It was also discussed that coumadin will have to be therapeutic x 3-4 weeks prior to any change in therapy.  Pt returns to afib clinic 11/30, s/p epidural for back pain for which pt had to stop warfarin and as of yet, has become therapeutic since restarting drug. The injection was not that helpful and pt now thinks she may need a hip replacement after the first of the year. Options of  restoring SR were discussed again and she would like to proceed with cardioversion with flecainide to see if flecainide will continue to work for pt. She has had good success with this drug over the years and is not that willing to move on to other options yet. She is wanting to do this as soon as possible so she can then plan for hip surgery. She is aware that she cannot interrupt blood thinner for at least 30 days after cardioversion. If that fails, she would next be interested in [name], if she can afford drug.  She is aware that she will require TEE guided cardioversion since she will not be therapeutic 4 weeks prior to cardioversion.  Returns to afib clinic one week s/p cardioversion. She is in SR but is still having exertional dyspnea. She was hoping that return to SR would improve this. EKG shows more pronounced T waves in the ant/inf leads. She denies exertional chest pain. Her BP was elevated at time of cardioversion and she received IV meds. At home,  it continues to be elevated. It was around 601 systolic on arrival, rechecked at 180/80. She was on 2.5 mg of amlodipine which was stopped in November for mild ankle swelling. She continues to have mild ankle swelling off drug and occasionally worse in rt leg which she will be seeing a orthopedic surgeon re today and may be pending surgery. Gabapentin was added since last visit but pt has not noted too much change in leg discomfort. Qtc is also prolonged today, unsure why, previous ekg's reviewed and qtc was in range. No other new meds to  explain change. She does use prn lasix. BP rechecked at 180/80. Echo in May showed moderate aortic regurg, normal EF with moderate diastolic dysfunction, pulmonary pressure increased at 53 mm Hg.  Return to afib clinic 1/12. She saw Dr. Percival Spanish for her ongoing shortness of breath and needing cardiac clearance for her desired back/hip sugery. However, she was not well rate controlled having stopped flecainide for prolonged QTc. She is wanting surgery so will try to rate control to get her thru surgery and address restoring SR at a later date. Dr.Hochrein started cardizem 30 mg tid which has helped control her heart rate but has contributed to swelling of her left leg, which is the one that is needing surgery. Review of her HR's at [place] indicate that majority of HR is now upper 80's to 90's, occasion low 100's, and would like to see HR's slower in the low 80's's to be in the best shape for surgery.  Today, she denies symptoms of  chest pain,  orthopnea, PND,  dizziness, presyncope, syncope, or neurologic sequela.   Positive for exertional shortness of breath, LEE left worse than rt, rt leg discomfort. The patient is tolerating medications without difficulties and is otherwise without complaint today.   Past Medical History:  Diagnosis Date  . Arthritis   . Bradycardia 2014  . Cataract   . Diverticulosis   . History of stress test    a. 04/09/13-nuclear stress-no ischemia low risk  . Hypertension   . Menopause   . Osteoporosis   . PAF (paroxysmal atrial fibrillation) ([place])    a. s/p prior DCCV; b. On flecainide & coumadin (CHA2DS2VASc = 4);  c. 03/2016 Echo: EF 55-60%, mod LVH, mod AI, mild to mod TR, PASP 42mmHg.  Marland Kitchen Pelvic fracture [facility])    Past Surgical History:  Procedure Laterality Date  . BIOPSY BREAST    . CARDIOVERSION N/A 10/30/2016   Procedure: CARDIOVERSION;  Surgeon: Larey Dresser, MD;  Location: [place];  Service: Cardiovascular;  Laterality: N/A;  . CATARACT EXTRACTION    .  EYE SURGERY    . TEE WITHOUT CARDIOVERSION N/A 10/30/2016   Procedure: TRANSESOPHAGEAL ECHOCARDIOGRAM (TEE);  Surgeon: Larey Dresser, MD;  Location[facility];  Service: Cardiovascular;  Laterality: N/A;    Current Outpatient Prescriptions  Medication Sig Dispense Refill  . acetaminophen (TYLENOL) 500 MG tablet Take 500 mg by mouth as directed.    . calcium-vitamin D (OSCAL WITH D) 250-125 MG-UNIT per tablet Take 1 tablet by mouth 2 (two) times daily.    . diclofenac sodium (VOLTAREN) 1 % GEL Apply 2 g topically 4 (four) times daily. 100 g 2  . diltiazem (CARDIZEM) 30 MG tablet Take 1 tablet (30 mg total) by mouth every 8 (eight) hours. 90 tablet 6  . folic acid (FOLVITE) 1 MG tablet Take 1 mg by mouth daily.    . furosemide (LASIX) 20 MG tablet Take 1  to 2 tablets by  mouth daily as needed 180 tablet 1  . gabapentin (NEURONTIN) 100 MG capsule Take 1 capsule (100 mg total) by mouth at bedtime. 30 capsule 1  . lisinopril (PRINIVIL,ZESTRIL) 40 MG tablet TAKE 1 TABLET BY MOUTH  DAILY 90 tablet 0  . methotrexate (RHEUMATREX) 2.5 MG tablet Take 4 tablets (10 mg total) by mouth once a week. 48 tablet 3  . Multiple Vitamin (MULTIVITAMIN) capsule Take 1 capsule by mouth daily.    . pindolol (VISKEN) 10 MG tablet Take 2 tablets (20mg ) in the morning and 1.5 tablets (15mg ) in the evening 315 tablet 2  . potassium chloride SA (K-DUR,KLOR-CON) 20 MEQ tablet TAKE 1 TABLET BY MOUTH  DAILY 90 tablet 0  . warfarin (COUMADIN) 5 MG tablet TAKE 1 TABLET BY MOUTH ONCE DAILY OR AS DIRECTED 90 tablet 0   No current facility-administered medications for this encounter.     Allergies  Allergen Reactions  . Benazepril Hcl Cough    Social History   Social History  . Marital status: Married    Spouse name: N/A  . Number of children: N/A  . Years of education: N/A   Occupational History  . Not on file.   Social History Main Topics  . Smoking status: Never Smoker  . Smokeless tobacco: Never Used    . Alcohol use No  . Drug use: No  . Sexual activity: Yes   Other Topics Concern  . Not on file   Social History Narrative  . No narrative on file    Family History  Problem Relation Age of Onset  . Stroke Mother     cerebral hemorrhage  . Heart disease Father     MI  . Heart attack Father   . Vision loss Father   . Heart disease Brother   . Heart attack Brother   . Cancer Maternal Aunt     breast  . Cancer Brother   . Heart disease Brother   . Cancer Daughter     breast    ROS- All systems are reviewed and negative except as per the HPI above  Physical Exam: Vitals:   11/29/16 1051  BP: 130/68  Pulse: 97  Weight: 138 lb (62.6 kg)  Height: 5\' 2"  (1.575 m)    GEN- The patient is well appearing, alert and oriented x 3 today.   Head- normocephalic, atraumatic Eyes-  Sclera clear, conjunctiva pink Ears- hearing intact Oropharynx- clear Neck- supple, no JVP Lymph- no cervical lymphadenopathy Lungs- Clear to ausculation bilaterally, normal work of breathing Heart-regular rate and rhythm, no murmurs, rubs or gallops, PMI not laterally displaced GI- soft, NT, ND, + BS Extremities- no clubbing, cyanosis, or mild LLE, rt greater than left, with compression socks on. MS- no significant deformity or atrophy Skin- no rash or lesion Psych- euthymic mood, full affect Neuro- strength and sensation are intact  EKG- afib at 97 bpm, qrs int 84 ms, qtc 454 ms Epic records reviewed Echo- 03/18/16 LV EF: 55% -   60%  ------------------------------------------------------------------- Indications:      SOB (R06.00).  ------------------------------------------------------------------- Study Conclusions  - Left ventricle: The cavity size was normal. Wall thickness was   increased in a pattern of moderate LVH. Systolic function was   normal. The estimated ejection fraction was in the range of 55%   to 60%. - Aortic valve: There was moderate regurgitation. - Tricuspid  valve: There was mild-moderate regurgitation. - Pulmonary arteries: Systolic pressure was moderately increased.  Assessment and Plan: 1. Paroxysmal afib with persistent afib x 4 weeks Successful cardioversion but flecainide was d/ced due to qtc prolongation, with return of afib with rvr Continue cardizem 30 mg tid, has helped HR but will try to control further to make sure we can get her to surgery Increase pindolol to 20 mg am and continue at 15 mg in evening Will not increase cardizem for LLE Continue as needed lasix to control swelling of LE's Keep elevated when sitting After she is well rate controlled, will check with Dr. Percival Spanish to see if any further preop evaluation is needed on his side for cardiac clearnace Continue warfarin with INR checks thru PCP.  2. HTN Better managed If BP drops too low with increase of pindolol, will decrease lisinopril, but by review of her BP's at home, I am not too concerned re this  3. QTC prolongation Has returned to normal range off flecainde   afib clinic in one week  Butch Penny C. Kienan Doublin, [facility] [address] [place], [place] [address] [phone]

## 2016-12-02 ENCOUNTER — Ambulatory Visit (HOSPITAL_COMMUNITY): Payer: Medicare Other | Admitting: Nurse Practitioner

## 2016-12-06 ENCOUNTER — Ambulatory Visit (HOSPITAL_COMMUNITY)
Admission: RE | Admit: 2016-12-06 | Discharge: 2016-12-06 | Disposition: A | Discharge status: Home / Self Care | Payer: Medicare Other | Source: Ambulatory Visit | Attending: Nurse Practitioner | Admitting: Nurse Practitioner

## 2016-12-06 ENCOUNTER — Encounter (HOSPITAL_COMMUNITY): Payer: Self-pay | Admitting: Nurse Practitioner

## 2016-12-06 VITALS — BP 128/74 | HR 123 | Ht 62.0 in | Wt 138.0 lb

## 2016-12-06 DIAGNOSIS — R0602 Shortness of breath: Secondary | ICD-10-CM | POA: Diagnosis not present

## 2016-12-06 DIAGNOSIS — I4581 Long QT syndrome: Secondary | ICD-10-CM | POA: Diagnosis not present

## 2016-12-06 DIAGNOSIS — I10 Essential (primary) hypertension: Secondary | ICD-10-CM | POA: Diagnosis not present

## 2016-12-06 DIAGNOSIS — Z79899 Other long term (current) drug therapy: Secondary | ICD-10-CM | POA: Diagnosis not present

## 2016-12-06 DIAGNOSIS — I4819 Other persistent atrial fibrillation: Secondary | ICD-10-CM

## 2016-12-06 DIAGNOSIS — Z7901 Long term (current) use of anticoagulants: Secondary | ICD-10-CM | POA: Diagnosis not present

## 2016-12-06 DIAGNOSIS — I481 Persistent atrial fibrillation: Secondary | ICD-10-CM | POA: Insufficient documentation

## 2016-12-06 MED ORDER — PINDOLOL 10 MG PO TABS: 20.0000 mg | ORAL_TABLET | Freq: Two times a day (BID) | ORAL | 2 refills | Status: DC

## 2016-12-06 NOTE — Patient Instructions (Signed)
Your physician has recommended you make the following change in your medication:  1)Increase pindolol 20mg  twice a day  Call mid-week with your bp/hr log

## 2016-12-06 NOTE — Progress Notes (Signed)
Primary Care Physician: Redge Gainer, MD Referring Physician: Ignacia Bayley, NP Cardiologist: Dr. Neomia Dear Susan Fitzgerald is a 83 y.o. female with a h/o paroxysmal afib that is in the afib clinic for evaluation for recent increase in afib burden. She has  history of atrial fibrillation status post prior cardioversion and subsequent antiarrhythmic therapy with flecainide. She is anticoagulated with Coumadin. She also takes pindolol. Over the years, she has had occasional paroxysms of short-lived A. fib. Earlier this year, she had a period of atrial fibrillation, which is usually associated with dyspnea when it occurs, and she took an additional pindolol and this subsequently broke. She was doing well when she last saw Dr. Percival Spanish in June. Since early September however, she has noted more frequent elevated heart rates and irregular heart rhythm. When in A. fib, rates are typically in the mid 90s to low 100s. She records her blood pressures and heart rates on a regular basis and has shown these to me today. Over the past 2 months, she is mostly in the 80s to 90s though occasionally will have rates in the 70s and 60s. She does not think that she has been in atrial fibrillation all of the time over the past 2 months. As above, she does have dyspnea on exertion when in A. fib but denies chest pain, PND, orthopnea, dizziness, syncope, or early satiety. She does have some degree of mild chronic venous stasis and lower extremity swelling and recently taken off amlodipine.  On visit with me, she feels that she has been in afib consistently over the last two weeks. I reviewed her V/S and her heart rate has been elevated closer to 100 bpm. In SR, she has a heart rate closer to 60. Discussion with pt for cardioversion with continuation on flecainide or flecainide washout and change to tikosyn. Pt is also planning to have an epidural Tuesday and will have to stop coumadin tonight. She wants to pursue this prior  to any change in afib management. It was also discussed that coumadin will have to be therapeutic x 3-4 weeks prior to any change in therapy.  Pt returns to afib clinic 11/30, s/p epidural for back pain for which pt had to stop warfarin and as of yet, has become therapeutic since restarting drug. The injection was not that helpful and pt now thinks she may need a hip replacement after the first of the year. Options of  restoring SR were discussed again and she would like to proceed with cardioversion with flecainide to see if flecainide will continue to work for pt. She has had good success with this drug over the years and is not that willing to move on to other options yet. She is wanting to do this as soon as possible so she can then plan for hip surgery. She is aware that she cannot interrupt blood thinner for at least 30 days after cardioversion. If that fails, she would next be interested in [name], if she can afford drug.  She is aware that she will require TEE guided cardioversion since she will not be therapeutic 4 weeks prior to cardioversion.  Returns to afib clinic one week s/p cardioversion. She is in SR but is still having exertional dyspnea. She was hoping that return to SR would improve this. EKG shows more pronounced T waves in the ant/inf leads. She denies exertional chest pain. Her BP was elevated at time of cardioversion and she received IV meds. At home,  it continues to be elevated. It was around 824 systolic on arrival, rechecked at 180/80. She was on 2.5 mg of amlodipine which was stopped in November for mild ankle swelling. She continues to have mild ankle swelling off drug and occasionally worse in rt leg which she will be seeing a orthopedic surgeon re today and may be pending surgery. Gabapentin was added since last visit but pt has not noted too much change in leg discomfort. Qtc is also prolonged today, unsure why, previous ekg's reviewed and qtc was in range. No other new meds to  explain change. She does use prn lasix. BP rechecked at 180/80. Echo in May showed moderate aortic regurg, normal EF with moderate diastolic dysfunction, pulmonary pressure increased at 53 mm Hg.  Return to afib clinic 1/12. She saw Dr. Percival Spanish for her ongoing shortness of breath and needing cardiac clearance for her desired back/hip sugery. However, she was not well rate controlled having stopped flecainide for prolonged QTc. She is wanting surgery so will try to rate control to get her thru surgery and address restoring SR at a later date. Dr.Hochrein started cardizem 30 mg tid which has helped control her heart rate but has contributed to swelling of her left leg, which is the one that is needing surgery. Review of her HR's at [place] indicate that majority of HR is now upper 80's to 90's, occasion low 100's, and would like to see HR's slower in the low 80's's to be in the best shape for surgery. Increased pindolol to 20 mg am and continued 15 mg pm.  She returns today,1/19 with afib poorly controlled, v rates in the 120's, despite increasing BB and CCB recently added. She continues to have LLE which is chronic but slightly more with addition of Cardizem. Review of her history shows  she was on amiodarone at one time but was stopped due to intolerance, possibly due to elevated liver enzymes. Her renal function is calculated  at 40 ml/min, which would probably limit dose of  Tikosyn to 125 mcg to possible 250 mcg bid. She really does not want to come into the hospital. Flecainide was recently stopped due to prolonged qtc and this would be a concern with tikosyn . Sotalol would also be a renal concern since it is contraindicated with renal cl less than 40 ml/min. As persisitent as her afib has been, I do not think that multaq would be effective. She really wants to achieve rate control and be able to have her surgery.   Today, she denies symptoms of  chest pain,  orthopnea, PND,  dizziness, presyncope,  syncope, or neurologic sequela.   Positive for exertional shortness of breath, LEE left worse than rt, rt leg discomfort. The patient is tolerating medications without difficulties and is otherwise without complaint today.   Past Medical History:  Diagnosis Date  . Arthritis   . Bradycardia 2014  . Cataract   . Diverticulosis   . History of stress test    a. 04/09/13-nuclear stress-no ischemia low risk  . Hypertension   . Menopause   . Osteoporosis   . PAF (paroxysmal atrial fibrillation) ([place])    a. s/p prior DCCV; b. On flecainide & coumadin (CHA2DS2VASc = 4);  c. 03/2016 Echo: EF 55-60%, mod LVH, mod AI, mild to mod TR, PASP 62mmHg.  Marland Kitchen Pelvic fracture [facility])    Past Surgical History:  Procedure Laterality Date  . BIOPSY BREAST    . CARDIOVERSION N/A 10/30/2016   Procedure: CARDIOVERSION;  Surgeon: Larey Dresser, MD;  Location: [place];  Service: Cardiovascular;  Laterality: N/A;  . CATARACT EXTRACTION    . EYE SURGERY    . TEE WITHOUT CARDIOVERSION N/A 10/30/2016   Procedure: TRANSESOPHAGEAL ECHOCARDIOGRAM (TEE);  Surgeon: Larey Dresser, MD;  Location: [facility] ENDOSCOPY;  Service: Cardiovascular;  Laterality: N/A;    Current Outpatient Prescriptions  Medication Sig Dispense Refill  . acetaminophen (TYLENOL) 500 MG tablet Take 500 mg by mouth as directed.    . calcium-vitamin D (OSCAL WITH D) 250-125 MG-UNIT per tablet Take 1 tablet by mouth 2 (two) times daily.    . diclofenac sodium (VOLTAREN) 1 % GEL Apply 2 g topically 4 (four) times daily. 100 g 2  . diltiazem (CARDIZEM) 30 MG tablet Take 1 tablet (30 mg total) by mouth every 8 (eight) hours. 90 tablet 6  . folic acid (FOLVITE) 1 MG tablet Take 1 mg by mouth daily.    . furosemide (LASIX) 20 MG tablet Take 1 to 2 tablets by  mouth daily as needed 180 tablet 1  . gabapentin (NEURONTIN) 100 MG capsule Take 1 capsule (100 mg total) by mouth at bedtime. 30 capsule 1  . lisinopril (PRINIVIL,ZESTRIL) 40 MG tablet TAKE 1 TABLET  BY MOUTH  DAILY 90 tablet 0  . methotrexate (RHEUMATREX) 2.5 MG tablet Take 4 tablets (10 mg total) by mouth once a week. 48 tablet 3  . Multiple Vitamin (MULTIVITAMIN) capsule Take 1 capsule by mouth daily.    . pindolol (VISKEN) 10 MG tablet Take 2 tablets (20 mg total) by mouth 2 (two) times daily. Take 2 tablets (20mg ) in the morning and 1.5 tablets (15mg ) in the evening 315 tablet 2  . potassium chloride SA (K-DUR,KLOR-CON) 20 MEQ tablet TAKE 1 TABLET BY MOUTH  DAILY 90 tablet 0  . warfarin (COUMADIN) 5 MG tablet TAKE 1 TABLET BY MOUTH ONCE DAILY OR AS DIRECTED 90 tablet 0   No current facility-administered medications for this encounter.     Allergies  Allergen Reactions  . Benazepril Hcl Cough    Social History   Social History  . Marital status: Married    Spouse name: N/A  . Number of children: N/A  . Years of education: N/A   Occupational History  . Not on file.   Social History Main Topics  . Smoking status: Never Smoker  . Smokeless tobacco: Never Used  . Alcohol use No  . Drug use: No  . Sexual activity: Yes   Other Topics Concern  . Not on file   Social History Narrative  . No narrative on file    Family History  Problem Relation Age of Onset  . Stroke Mother     cerebral hemorrhage  . Heart disease Father     MI  . Heart attack Father   . Vision loss Father   . Heart disease Brother   . Heart attack Brother   . Cancer Maternal Aunt     breast  . Cancer Brother   . Heart disease Brother   . Cancer Daughter     breast    ROS- All systems are reviewed and negative except as per the HPI above  Physical Exam: Vitals:   12/06/16 1130  BP: 128/74  Pulse: (!) 123  Weight: 138 lb (62.6 kg)  Height: 5\' 2"  (1.575 m)    GEN- The patient is well appearing, alert and oriented x 3 today.   Head- normocephalic, atraumatic Eyes-  Sclera clear,  conjunctiva pink Ears- hearing intact Oropharynx- clear Neck- supple, no JVP Lymph- no cervical  lymphadenopathy Lungs- Clear to ausculation bilaterally, normal work of breathing Heart-rapid, irregular rate and rhythm, no murmurs, rubs or gallops, PMI not laterally displaced GI- soft, NT, ND, + BS Extremities- no clubbing, cyanosis, or mild LLE, rt greater than left, with compression socks on. MS- no significant deformity or atrophy Skin- no rash or lesion Psych- euthymic mood, full affect Neuro- strength and sensation are intact  EKG- afib at 97 bpm, qrs int 84 ms, qtc 454 ms Epic records reviewed Echo- 03/18/16 LV EF: 55% -   60%  ------------------------------------------------------------------- Indications:      SOB (R06.00).  ------------------------------------------------------------------- Study Conclusions  - Left ventricle: The cavity size was normal. Wall thickness was   increased in a pattern of moderate LVH. Systolic function was   normal. The estimated ejection fraction was in the range of 55%   to 60%. - Aortic valve: There was moderate regurgitation. - Tricuspid valve: There was mild-moderate regurgitation. - Pulmonary arteries: Systolic pressure was moderately increased.  Assessment and Plan: 1. Persistent afib with rvr, resistant to rate control See discussion in HPI for discussion of AAD's Continue cardizem 30 mg tid for now, may have to change to 120 mg daily, if rvr continues to be an issue, right now LLE is unchanged, not worse  Increase pindolol to 20 mg am and  go up to  20 mg in evening Had been on dig at one time but it was stopped due to bradycardia that pt was hospitalized for in 2014, with HR in the 30's Continue as needed lasix to control swelling of LE's Keep elevated when sitting After she is well rate controlled, will check with Dr. Percival Spanish to see if any further preop evaluation is needed on his side for cardiac clearnace Continue warfarin with INR checks thru PCP.  2. HTN Better managed If BP drops too low with increase of pindolol,  will decrease lisinopril, but by review of her BP's at home, I am not too concerned re this  3. QTC prolongation Has returned to normal range off flecainide  She will call me with BP/HR numbers in one week and then make further decisions Will discuss with Dr. Rayann Heman in the interium   [place]. Tenley Winward, [facility] [address] [place], [place] [address] [phone]

## 2016-12-09 ENCOUNTER — Telehealth (HOSPITAL_COMMUNITY): Payer: Self-pay | Admitting: *Deleted

## 2016-12-09 NOTE — Telephone Encounter (Signed)
Pt cld regarding increase of pindolol to 2 tablets 20 mg bid.  Pt stated that her heart rate the day after her Friday appt was in the 70s, but then had some numbers in the 40s on Saturday.  This has since gone up with some in the 90s some in the 80s.  Pt wanted to know how long this increase would take to work.    Pt also asked about increase in Cardizem.  930 124/66 77 2:30 109/80 69; to be reviewed by Roderic Palau, NP

## 2016-12-11 ENCOUNTER — Other Ambulatory Visit (HOSPITAL_COMMUNITY): Payer: Self-pay | Admitting: *Deleted

## 2016-12-11 ENCOUNTER — Telehealth (HOSPITAL_COMMUNITY): Payer: Self-pay | Admitting: *Deleted

## 2016-12-11 MED ORDER — DILTIAZEM HCL 30 MG PO TABS: ORAL_TABLET | ORAL | 6 refills | Status: DC

## 2016-12-11 MED ORDER — DILTIAZEM HCL ER COATED BEADS 120 MG PO CP24: 120.0000 mg | ORAL_CAPSULE | Freq: Every day | ORAL | 3 refills | Status: DC

## 2016-12-11 MED ORDER — PINDOLOL 10 MG PO TABS: ORAL_TABLET | ORAL | 2 refills | Status: DC

## 2016-12-11 NOTE — Telephone Encounter (Signed)
Pt called in with report. Stating HR seem to be controlled but since increasing pindolol to 20mg  BID she has been extremely hoarse which is noted as side effect of drug. Discussed with Roderic Palau NP will decrease pindolol back down to 20mg  in the AM and 15mg  in the PM and start cardizem cd 120mg  once a day. The 30mg  tablets of cardizem will be for PRN use only. Pt will call early next week with tolerance/hr log of medication changes.

## 2016-12-16 ENCOUNTER — Ambulatory Visit (INDEPENDENT_AMBULATORY_CARE_PROVIDER_SITE_OTHER): Payer: Medicare Other | Admitting: Pharmacist

## 2016-12-16 ENCOUNTER — Telehealth (HOSPITAL_COMMUNITY): Payer: Self-pay | Admitting: *Deleted

## 2016-12-16 ENCOUNTER — Encounter: Payer: Self-pay | Admitting: Pharmacist

## 2016-12-16 ENCOUNTER — Other Ambulatory Visit (HOSPITAL_COMMUNITY): Payer: Self-pay | Admitting: *Deleted

## 2016-12-16 DIAGNOSIS — I482 Chronic atrial fibrillation, unspecified: Secondary | ICD-10-CM

## 2016-12-16 DIAGNOSIS — Z7901 Long term (current) use of anticoagulants: Secondary | ICD-10-CM | POA: Diagnosis not present

## 2016-12-16 LAB — COAGUCHEK XS/INR WAIVED
INR: 2.6 — AB (ref 0.9–1.1)
PROTHROMBIN TIME: 31.7 s

## 2016-12-16 MED ORDER — GABAPENTIN 100 MG PO CAPS: 100.0000 mg | ORAL_CAPSULE | Freq: Every day | ORAL | 1 refills | Status: DC

## 2016-12-16 MED ORDER — DILTIAZEM HCL ER COATED BEADS 180 MG PO CP24: 180.0000 mg | ORAL_CAPSULE | Freq: Every day | ORAL | 3 refills | Status: DC

## 2016-12-16 NOTE — Addendum Note (Signed)
Addended by: Cherre Robins on: 12/16/2016 02:38 PM   Modules accepted: Orders

## 2016-12-16 NOTE — Telephone Encounter (Signed)
Patient called in with report with adding cardizem 120mg  on. HRs are running mainly in the 80-90 range. Swelling is no worse on cardizem daily. Discussed with Susan Palau NP will increase cardizem to 180mg  once a day and call report on Friday.

## 2016-12-17 ENCOUNTER — Encounter: Payer: Self-pay | Admitting: Family Medicine

## 2016-12-17 ENCOUNTER — Ambulatory Visit (INDEPENDENT_AMBULATORY_CARE_PROVIDER_SITE_OTHER): Payer: Medicare Other | Admitting: Family Medicine

## 2016-12-17 VITALS — BP 136/84 | HR 106 | Temp 97.0°F | Ht 62.0 in | Wt 137.0 lb

## 2016-12-17 DIAGNOSIS — M1611 Unilateral primary osteoarthritis, right hip: Secondary | ICD-10-CM | POA: Diagnosis not present

## 2016-12-17 DIAGNOSIS — I482 Chronic atrial fibrillation, unspecified: Secondary | ICD-10-CM

## 2016-12-17 DIAGNOSIS — I872 Venous insufficiency (chronic) (peripheral): Secondary | ICD-10-CM | POA: Diagnosis not present

## 2016-12-17 DIAGNOSIS — M05741 Rheumatoid arthritis with rheumatoid factor of right hand without organ or systems involvement: Secondary | ICD-10-CM

## 2016-12-17 DIAGNOSIS — Z7901 Long term (current) use of anticoagulants: Secondary | ICD-10-CM

## 2016-12-17 DIAGNOSIS — M05742 Rheumatoid arthritis with rheumatoid factor of left hand without organ or systems involvement: Secondary | ICD-10-CM | POA: Diagnosis not present

## 2016-12-17 NOTE — Progress Notes (Signed)
Subjective:    Patient ID: Susan Fitzgerald, female    DOB: 1932-10-31, 83 y.o.   MRN: [id number]  HPI Patient here this morning to follow up on right foot numbness and change in color. Patient indicates this is been going on for a while. She is also been diagnosed with rheumatoid arthritis. She is also having problems with her heart rate and she just took the first dose of her increased medication this morning. This medicine was increased by the nurse practitioner. She is also planning to have surgery on her right hip joint. Patient has a history of rheumatoid arthritis atrial fibrillation and she is seeing and has seen the neurosurgeon and is seeing the orthopedic surgeon for this possible right hip replacement. They're concerned about her heart rate and want to get the rate down to a reasonable level. She denies any chest pain. She has had trouble with this discoloration in her lower extremities with the right being worse than the left for a good while. And she is concerned about this.    Patient Active Problem List   Diagnosis Date Noted  . Abdominal aortic atherosclerosis ([place]) 09/24/2016  . Rheumatoid arthritis ([place]) 05/07/2016  . Symptomatic bradycardia 06/02/2013  . Hypertension 04/21/2013  . Osteoporosis 04/21/2013  . Atrial fibrillation, chronic ([place]) 02/09/2013   Outpatient Encounter Prescriptions as of 12/17/2016  Medication Sig  . acetaminophen (TYLENOL) 500 MG tablet Take 500 mg by mouth as directed.  . calcium-vitamin D (OSCAL WITH D) 250-125 MG-UNIT per tablet Take 1 tablet by mouth 2 (two) times daily.  . diclofenac sodium (VOLTAREN) 1 % GEL Apply 2 g topically 4 (four) times daily.  Marland Kitchen diltiazem (CARDIZEM CD) 180 MG 24 hr capsule Take 1 capsule (180 mg total) by mouth daily.  Marland Kitchen diltiazem (CARDIZEM) 30 MG tablet Take 1 tablet every 4 hours AS NEEDED for heart rate >100 as long as blood pressure >100.  . folic acid (FOLVITE) 1 MG tablet Take 1 mg by mouth daily.  . furosemide  (LASIX) 20 MG tablet Take 1 to 2 tablets by  mouth daily as needed  . gabapentin (NEURONTIN) 100 MG capsule Take 1 capsule (100 mg total) by mouth at bedtime.  Marland Kitchen lisinopril (PRINIVIL,ZESTRIL) 40 MG tablet TAKE 1 TABLET BY MOUTH  DAILY  . methotrexate (RHEUMATREX) 2.5 MG tablet Take 4 tablets (10 mg total) by mouth once a week.  . Multiple Vitamin (MULTIVITAMIN) capsule Take 1 capsule by mouth daily.  . pindolol (VISKEN) 10 MG tablet Take 2 tablets (20mg ) in the morning and 1.5 tablets (15mg ) in the evening  . potassium chloride SA (K-DUR,KLOR-CON) 20 MEQ tablet TAKE 1 TABLET BY MOUTH  DAILY  . warfarin (COUMADIN) 5 MG tablet TAKE 1 TABLET BY MOUTH ONCE DAILY OR AS DIRECTED   No facility-administered encounter medications on file as of 12/17/2016.       Review of Systems  Constitutional: Negative.   HENT: Negative.   Eyes: Negative.   Respiratory: Negative.   Cardiovascular: Negative.   Gastrointestinal: Negative.   Endocrine: Negative.   Genitourinary: Negative.   Musculoskeletal: Negative.   Skin: Negative.   Allergic/Immunologic: Negative.   Neurological: Positive for numbness (right foot with color change / purple).  Hematological: Negative.   Psychiatric/Behavioral: Negative.        Objective:   Physical Exam  Constitutional: She is oriented to person, place, and time. She appears well-developed and well-nourished.  HENT:  Head: Normocephalic and atraumatic.  Eyes: Conjunctivae and EOM  are normal. Pupils are equal, round, and reactive to light. Right eye exhibits no discharge. Left eye exhibits no discharge. No scleral icterus.  Neck: Normal range of motion. Neck supple. No thyromegaly present.  Cardiovascular: Normal rate, normal heart sounds and intact distal pulses.   No murmur heard. The heart is irregular irregular at 96/m  Pulmonary/Chest: Effort normal and breath sounds normal. No respiratory distress. She has no wheezes. She has no rales.  Lungs are clear  anteriorly and posteriorly  Abdominal: Soft. Bowel sounds are normal. She exhibits no mass. There is no tenderness. There is no rebound and no guarding.  Musculoskeletal: She exhibits no edema.  He should has venous distention of the right lower extremity and left lower extremity. With elevation and compression the discoloration goes away. She has good pulses bilaterally. She was reassured that this is all venous insufficiency. The patient is using a cane for gait stability and ambulation.  Lymphadenopathy:    She has no cervical adenopathy.  Neurological: She is alert and oriented to person, place, and time.  Skin: Skin is warm and dry. No rash noted.  Psychiatric: She has a normal mood and affect. Her behavior is normal. Judgment and thought content normal.  Nursing note and vitals reviewed.  BP (!) 150/94 (BP Location: Right Arm)   Pulse (!) 106   Temp 97 F (36.1 C) (Oral)   Ht 5\' 2"  (1.575 m)   Wt 137 lb (62.1 kg)   BMI 25.06 kg/m         Assessment & Plan:  1. Venous insufficiency -Continue to wear compression stockings  2. Atrial fibrillation, chronic (HCC) -Tinny to follow-up with cardiology for this problem as well as for rate control with increased medication  3. Chronic anticoagulation -Continue anticoagulation because of atrial fibrillation  4. Rheumatoid arthritis involving both hands with positive rheumatoid factor ([place]) -Continue to follow-up with rheumatologist  5. Arthritis of right hip - follow-up with orthopedist  Patient Instructions  Continue to wear support hose Follow-up with rheumatology and cardiology Follow-up with neurosurgery as needed and follow-up with orthopedic surgery for planned hip replacement  Arrie Senate MD

## 2016-12-17 NOTE — Patient Instructions (Signed)
Continue to wear support hose Follow-up with rheumatology and cardiology Follow-up with neurosurgery as needed and follow-up with orthopedic surgery for planned hip replacement

## 2016-12-20 ENCOUNTER — Telehealth (HOSPITAL_COMMUNITY): Payer: Self-pay | Admitting: *Deleted

## 2016-12-20 NOTE — Telephone Encounter (Signed)
Pt has been checking her HR per Butch Penny for the last week  137/69 HR 74 at 3:30 today This morning at 9:40 before meds was 126/81 with a HR of 82  New dosage of cardizem was started on 12/19/2015 at 180 mg and HR have stayed between 57-84.     She reports she has been feeling okay.  Pt can still feel she is out of rhythm but no pounding feeling. Pt has been wearing compression hose and has some swelling at the end of the day, but this has improved.  Particularly swelling on the right leg as before.

## 2016-12-23 NOTE — Telephone Encounter (Signed)
OK to have hip surgery.  OK to hold anticoagulation.  No bridging is indicated.  Based on ACC/AHA guidelines, the patient would be at acceptable risk for the planned procedure without further cardiovascular testing.

## 2016-12-23 NOTE — Telephone Encounter (Signed)
Patient with controlled rates at home on current medication regimen. Will forward information to Dr. Percival Spanish to determine cardiology clearance for needed hip surgery. Please call patient with recommendations once discussed with Dr. Percival Spanish.

## 2016-12-24 NOTE — Telephone Encounter (Signed)
Spoke with pt she stated Dr Lyla Glassing will be doing her surgery, clearance faxed to Dr Lyla Glassing at [facility]

## 2016-12-26 ENCOUNTER — Ambulatory Visit: Payer: Self-pay | Admitting: Orthopedic Surgery

## 2017-01-02 ENCOUNTER — Encounter: Payer: Self-pay | Admitting: Family Medicine

## 2017-01-02 ENCOUNTER — Telehealth (HOSPITAL_COMMUNITY): Payer: Self-pay | Admitting: *Deleted

## 2017-01-02 ENCOUNTER — Ambulatory Visit (INDEPENDENT_AMBULATORY_CARE_PROVIDER_SITE_OTHER): Payer: Medicare Other

## 2017-01-02 ENCOUNTER — Ambulatory Visit (INDEPENDENT_AMBULATORY_CARE_PROVIDER_SITE_OTHER): Payer: Medicare Other | Admitting: Family Medicine

## 2017-01-02 ENCOUNTER — Telehealth: Payer: Self-pay | Admitting: Family Medicine

## 2017-01-02 VITALS — BP 90/58 | HR 79 | Temp 97.9°F | Ht 62.0 in | Wt 133.8 lb

## 2017-01-02 DIAGNOSIS — I10 Essential (primary) hypertension: Secondary | ICD-10-CM

## 2017-01-02 DIAGNOSIS — J069 Acute upper respiratory infection, unspecified: Secondary | ICD-10-CM | POA: Diagnosis not present

## 2017-01-02 MED ORDER — BENZONATATE 100 MG PO CAPS: 100.0000 mg | ORAL_CAPSULE | Freq: Two times a day (BID) | ORAL | 0 refills | Status: DC | PRN

## 2017-01-02 MED ORDER — FLUTICASONE PROPIONATE 50 MCG/ACT NA SUSP: 2.0000 | Freq: Every day | NASAL | 0 refills | Status: DC

## 2017-01-02 NOTE — Addendum Note (Signed)
Addended by: Timmothy Euler on: 01/02/2017 01:28 PM   Modules accepted: Orders

## 2017-01-02 NOTE — Progress Notes (Addendum)
   HPI  Patient presents today here with cough and congestion.  Patient explains last 3-4 days she's had cough and congestion. Her cough is nonproductive. She is breathing normally. She has had uncontrolled heart rate with atrial fibrillation, she took an extra 30 mg of diltiazem about 4 hours ago and has had good improvement in her heart rate.  Her diltiazem is actively being titrated by cardiology.  She denies any chest pain.  Takes that she's been keeping a careful record of her blood pressures, they've been 120s and 30s for the last few days. Occasionally she has a low blood pressure like today of 90 systolic.  She is tolerating food and fluids like normal.   PMH: Smoking status noted ROS: Per HPI  Objective: BP (!) 90/58   Pulse 79   Temp 97.9 F (36.6 C) (Oral)   Ht _0  (1.575 m)   Wt 133 lb 12.8 oz (60.7 kg)   BMI 24.47 kg/m  Gen: NAD, alert, cooperative with exam HEENT: NCAT, nares clear, oropharynx moist and clear CV: Irregularly irregular Resp: CTABL, no wheezes, non-labored Abd: SNTND, BS present, no guarding or organomegaly Ext: Trace pitting edema bilateral lower extremities Neuro: Alert and oriented, No gross deficits  Assessment and plan:  # Acute URI Most likely viral URI, does not appear severe enough to be influenza To be sure no underlying bacterial infection I ordered a chest x-ray and CBC. Omnicef if needed Tessalon, flonase  # Hypertension Low blood pressure today, possibly due to acute illness, however patient does not have any other signs of serious bacterial infection I recommended holding the lisinopril 2 days then restarting 20 mg for 2 days, then back to normal.    Orders Placed This Encounter  Procedures  . DG Chest 2 View    Standing Status:   Future    Standing Expiration Date:   03/02/2018    Order Specific Question:   Reason for Exam (SYMPTOM  OR DIAGNOSIS REQUIRED)    Answer:   cough, eval for CAP    Order Specific  Question:   Preferred imaging location?    Answer:   Internal  . CBC with Differential/Platelet  . [place], MD [facility] 01/02/2017, 1:20 PM

## 2017-01-02 NOTE — Telephone Encounter (Signed)
Patient called in stating for the last day or so her HR has been between 100-120. She does endorse an upper respiratory illness that started on Sunday without much improvement. Instructed patient to be in contact with her PCP for treatment of upper respiratory illness as this is most likely the culprit of increased heart rates. Told pt she can take the PRN cardizem 30mg  tablets for elevated heart rates. Pt will call her PCP today to be assessed and will call back if her HRs do not return to normal in the next few days.

## 2017-01-02 NOTE — Patient Instructions (Signed)
Great to meet you!  Stop lisinopril for 2 days, then restart 20 mg daily for 2 days, then get back to normal.    Viral Respiratory Infection A respiratory infection is an illness that affects part of the respiratory system, such as the lungs, nose, or throat. Most respiratory infections are caused by either viruses or bacteria. A respiratory infection that is caused by a virus is called a viral respiratory infection. Common types of viral respiratory infections include:  A cold.  The flu (influenza).  A respiratory syncytial virus (RSV) infection. How do I know if I have a viral respiratory infection? Most viral respiratory infections cause:  A stuffy or runny nose.  Yellow or green nasal discharge.  A cough.  Sneezing.  Fatigue.  Achy muscles.  A sore throat.  Sweating or chills.  A fever.  A headache. How are viral respiratory infections treated? If influenza is diagnosed early, it may be treated with an antiviral medicine that shortens the length of time a person has symptoms. Symptoms of viral respiratory infections may be treated with over-the-counter and prescription medicines, such as:  Expectorants. These make it easier to cough up mucus.  Decongestant nasal sprays. Health care providers do not prescribe antibiotic medicines for viral infections. This is because antibiotics are designed to kill bacteria. They have no effect on viruses. How do I know if I should stay home from work or school? To avoid exposing others to your respiratory infection, stay home if you have:  A fever.  A persistent cough.  A sore throat.  A runny nose.  Sneezing.  Muscles aches.  Headaches.  Fatigue.  Weakness.  Chills.  Sweating.  Nausea. Follow these instructions at home:  Rest as much as possible.  Take over-the-counter and prescription medicines only as told by your health care provider.  Drink enough fluid to keep your urine clear or pale yellow. This  helps prevent dehydration and helps loosen up mucus.  Gargle with a salt-water mixture 3-4 times per day or as needed. To make a salt-water mixture, completely dissolve -1 tsp of salt in 1 cup of warm water.  Use nose drops made from salt water to ease congestion and soften raw skin around your nose.  Do not drink alcohol.  Do not use tobacco products, including cigarettes, chewing tobacco, and e-cigarettes. If you need help quitting, ask your health care provider. Contact a health care provider if:  Your symptoms last for 10 days or longer.  Your symptoms get worse over time.  You have a fever.  You have severe sinus pain in your face or forehead.  The glands in your jaw or neck become very swollen. Get help right away if:  You feel pain or pressure in your chest.  You have shortness of breath.  You faint or feel like you will faint.  You have severe and persistent vomiting.  You feel confused or disoriented. This information is not intended to replace advice given to you by your health care provider. Make sure you discuss any questions you have with your health care provider. Document Released: 08/14/2005 Document Revised: 04/11/2016 Document Reviewed: 04/12/2015 Elsevier Interactive Patient Education  2017 [employer].

## 2017-01-02 NOTE — Telephone Encounter (Signed)
Pt has cough, congestion elevated HR appt scheduled w/ dr Wendi Snipes Pt notified

## 2017-01-03 LAB — CBC WITH DIFFERENTIAL/PLATELET
Basophils Absolute: 0 10*3/uL (ref 0.0–0.2)
Basos: 0 %
EOS (ABSOLUTE): 0.1 10*3/uL (ref 0.0–0.4)
EOS: 1 %
HEMATOCRIT: 38.1 % (ref 34.0–46.6)
HEMOGLOBIN: 12.5 g/dL (ref 11.1–15.9)
IMMATURE GRANS (ABS): 0 10*3/uL (ref 0.0–0.1)
IMMATURE GRANULOCYTES: 0 %
Lymphocytes Absolute: 1.5 10*3/uL (ref 0.7–3.1)
Lymphs: 15 %
MCH: 32 pg (ref 26.6–33.0)
MCHC: 32.8 g/dL (ref 31.5–35.7)
MCV: 97 fL (ref 79–97)
MONOCYTES: 15 %
MONOS ABS: 1.5 10*3/uL — AB (ref 0.1–0.9)
NEUTROS PCT: 69 %
Neutrophils Absolute: 7.1 10*3/uL — ABNORMAL HIGH (ref 1.4–7.0)
Platelets: 201 10*3/uL (ref 150–379)
RBC: 3.91 x10E6/uL (ref 3.77–5.28)
RDW: 14.5 % (ref 12.3–15.4)
WBC: 10.2 10*3/uL (ref 3.4–10.8)

## 2017-01-03 LAB — CMP14+EGFR
A/G RATIO: 1.2 (ref 1.2–2.2)
ALBUMIN: 3.6 g/dL (ref 3.5–4.7)
ALT: 16 IU/L (ref 0–32)
AST: 23 IU/L (ref 0–40)
Alkaline Phosphatase: 81 IU/L (ref 39–117)
BILIRUBIN TOTAL: 0.8 mg/dL (ref 0.0–1.2)
BUN / CREAT RATIO: 17 (ref 12–28)
BUN: 17 mg/dL (ref 8–27)
CHLORIDE: 97 mmol/L (ref 96–106)
CO2: 25 mmol/L (ref 18–29)
Calcium: 9.2 mg/dL (ref 8.7–10.3)
Creatinine, Ser: 1.03 mg/dL — ABNORMAL HIGH (ref 0.57–1.00)
GFR calc non Af Amer: 51 mL/min/{1.73_m2} — ABNORMAL LOW (ref >59)
GFR, EST AFRICAN AMERICAN: 58 mL/min/{1.73_m2} — AB (ref >59)
GLOBULIN, TOTAL: 2.9 g/dL (ref 1.5–4.5)
Glucose: 90 mg/dL (ref 65–99)
POTASSIUM: 4.5 mmol/L (ref 3.5–5.2)
SODIUM: 139 mmol/L (ref 134–144)
TOTAL PROTEIN: 6.5 g/dL (ref 6.0–8.5)

## 2017-01-06 ENCOUNTER — Telehealth: Payer: Self-pay | Admitting: Family Medicine

## 2017-01-06 MED ORDER — CEFDINIR 300 MG PO CAPS: 300.0000 mg | ORAL_CAPSULE | Freq: Two times a day (BID) | ORAL | 0 refills | Status: DC

## 2017-01-06 NOTE — Telephone Encounter (Signed)
Not in favor for narcotic either. Cough med for night time since not resting. Will the abx help the cough & bronchitis / laryngitis.

## 2017-01-06 NOTE — Telephone Encounter (Signed)
Antibiotics sent, unfortunately stronger cough meds contain narcotics so I would recommend avoiding those.   Laroy Apple, MD [facility] 01/06/2017, 12:09 PM

## 2017-01-06 NOTE — Telephone Encounter (Signed)
Pt aware of recommendations. Will take 2 capsules of the Tessalon she has at home & will pickup the abx at the pharmacy

## 2017-01-06 NOTE — Telephone Encounter (Signed)
I expect it will work well with the cough.   Tessalon can be taken 2 capsules at a time. We can refill 200 mg if this is helpful.   Laroy Apple, MD [facility] 01/06/2017, 12:53 PM

## 2017-01-09 ENCOUNTER — Other Ambulatory Visit: Payer: Self-pay | Admitting: *Deleted

## 2017-01-09 ENCOUNTER — Telehealth: Payer: Self-pay | Admitting: Cardiology

## 2017-01-09 MED ORDER — DILTIAZEM HCL ER COATED BEADS 180 MG PO CP24
180.0000 mg | ORAL_CAPSULE | Freq: Every day | ORAL | 3 refills | Status: DC
Start: 2017-01-09 — End: 2017-02-10

## 2017-01-09 NOTE — Telephone Encounter (Signed)
Approved    Disp Refills Start End  diltiazem (CARDIZEM CD) 180 MG 24 hr capsule 90 capsule 3 01/09/2017   Sig - Route:  Take 1 capsule (180 mg total) by mouth daily. - Oral  Class:  Normal  DAW:  No  Authorizing Provider:  Minus Breeding, MD  Ordering User:  Dannette Barbara, [place], [place] - [address]

## 2017-01-09 NOTE — Telephone Encounter (Signed)
New message    *STAT* If patient is at the pharmacy, call can be transferred to refill team.   1. Which medications need to be refilled? (please list name of each medication and dose if known)  diltiazem (CARDIZEM CD) 180 MG 24 hr capsule Take 1 capsule (180 mg total) by mouth daily     2. Which pharmacy/location (including street and city if local pharmacy) is medication to be sent to? [facility]  3. Do they need a 30 day or 90 day supply? 90    Wrong prescription was sent in last time only 30 mg was sent instead of 180mg , a new prescription needs faxed to them immediately or pt is going to run out of meds

## 2017-01-14 ENCOUNTER — Ambulatory Visit: Payer: Self-pay | Admitting: Orthopedic Surgery

## 2017-01-14 NOTE — H&P (Signed)
TOTAL HIP ADMISSION H&P  Patient is admitted for right total hip arthroplasty.  Subjective:  Chief Complaint: right hip pain  HPI: [name], 83 y.o. female, has a history of pain and functional disability in the right hip(s) due to arthritis and patient has failed non-surgical conservative treatments for greater than 12 weeks to include NSAID's and/or analgesics, corticosteriod injections, flexibility and strengthening excercises, use of assistive devices, weight reduction as appropriate and activity modification.  Onset of symptoms was abrupt starting 2 years ago with rapidlly worsening course since that time.The patient noted no past surgery on the right hip(s).  Patient currently rates pain in the right hip at 10 out of 10 with activity. Patient has night pain, worsening of pain with activity and weight bearing, pain that interfers with activities of daily living, pain with passive range of motion and crepitus. Patient has evidence of subchondral cysts, subchondral sclerosis, periarticular osteophytes, joint space narrowing and AVN with collapse by imaging studies. This condition presents safety issues increasing the risk of falls.  There is no current active infection.  Patient Active Problem List   Diagnosis Date Noted  . Abdominal aortic atherosclerosis ([place]) 09/24/2016  . Rheumatoid arthritis ([place]) 05/07/2016  . Symptomatic bradycardia 06/02/2013  . Hypertension 04/21/2013  . Osteoporosis 04/21/2013  . Atrial fibrillation, chronic ([place]) 02/09/2013   Past Medical History:  Diagnosis Date  . Arthritis   . Bradycardia 2014  . Cataract   . Diverticulosis   . History of stress test    a. 04/09/13-nuclear stress-no ischemia low risk  . Hypertension   . Menopause   . Osteoporosis   . PAF (paroxysmal atrial fibrillation) ([place])    a. s/p prior DCCV; b. On flecainide & coumadin (CHA2DS2VASc = 4);  c. 03/2016 Echo: EF 55-60%, mod LVH, mod AI, mild to mod TR, PASP 40mmHg.  Marland Kitchen Pelvic  fracture [facility])     Past Surgical History:  Procedure Laterality Date  . BIOPSY BREAST    . CARDIOVERSION N/A 10/30/2016   Procedure: CARDIOVERSION;  Surgeon: Larey Dresser, MD;  Location: [facility];  Service: Cardiovascular;  Laterality: N/A;  . CATARACT EXTRACTION    . EYE SURGERY    . TEE WITHOUT CARDIOVERSION N/A 10/30/2016   Procedure: TRANSESOPHAGEAL ECHOCARDIOGRAM (TEE);  Surgeon: Larey Dresser, MD;  Location: [facility] ENDOSCOPY;  Service: Cardiovascular;  Laterality: N/A;     (Not in a hospital admission) Allergies  Allergen Reactions  . Benazepril Hcl Cough    Social History  Substance Use Topics  . Smoking status: Never Smoker  . Smokeless tobacco: Never Used  . Alcohol use No    Family History  Problem Relation Age of Onset  . Stroke Mother     cerebral hemorrhage  . Heart disease Father     MI  . Heart attack Father   . Vision loss Father   . Heart disease Brother   . Heart attack Brother   . Cancer Maternal Aunt     breast  . Cancer Brother   . Heart disease Brother   . Cancer Daughter     breast     Review of Systems  Constitutional: Negative.   HENT: Positive for tinnitus. Negative for congestion, ear discharge, ear pain, hearing loss, nosebleeds, sinus pain and sore throat.   Eyes: Negative.   Respiratory: Negative.  Negative for stridor.   Cardiovascular: Negative.   Gastrointestinal: Negative.   Genitourinary: Negative.   Musculoskeletal: Positive for back pain and joint pain.  Skin: Negative.   Neurological: Negative.   Endo/Heme/Allergies: Negative.   Psychiatric/Behavioral: Negative.     Objective:  Physical Exam  Vitals reviewed. Constitutional: She is oriented to person, place, and time. She appears well-developed and well-nourished.  HENT:  Head: Normocephalic and atraumatic.  Eyes: Conjunctivae and EOM are normal. Pupils are equal, round, and reactive to light.  Neck: Normal range of motion. Neck supple.  Cardiovascular:  Normal rate and intact distal pulses.   Respiratory: Effort normal. No respiratory distress.  GI: Soft. She exhibits no distension.  Genitourinary:  Genitourinary Comments: deferred  Musculoskeletal:       Right hip: She exhibits decreased range of motion, bony tenderness and crepitus.  Neurological: She is alert and oriented to person, place, and time. She has normal reflexes.  Skin: Skin is warm and dry.  Psychiatric: She has a normal mood and affect. Her behavior is normal. Judgment and thought content normal.    Vital signs in last 24 hours: @VSRANGES @  Labs:   Estimated body mass index is 24.47 kg/m as calculated from the following:   Height as of 01/02/17: 5\' 2"  (1.575 m).   Weight as of 01/02/17: 60.7 kg (133 lb 12.8 oz).   Imaging Review Plain radiographs demonstrate severe degenerative joint disease of the right hip(s). The bone quality appears to be adequate for age and reported activity level.  Assessment/Plan:  End stage arthritis, right hip(s)  The patient history, physical examination, clinical judgement of the provider and imaging studies are consistent with end stage degenerative joint disease of the right hip(s) and total hip arthroplasty is deemed medically necessary. The treatment options including medical management, injection therapy, arthroscopy and arthroplasty were discussed at length. The risks and benefits of total hip arthroplasty were presented and reviewed. The risks due to aseptic loosening, infection, stiffness, dislocation/subluxation,  thromboembolic complications and other imponderables were discussed.  The patient acknowledged the explanation, agreed to proceed with the plan and consent was signed. Patient is being admitted for inpatient treatment for surgery, pain control, PT, OT, prophylactic antibiotics, VTE prophylaxis, progressive ambulation and ADL's and discharge planning.The patient is planning to be discharged home with HEP. Chronic coumadin,  MTX

## 2017-01-16 ENCOUNTER — Ambulatory Visit (INDEPENDENT_AMBULATORY_CARE_PROVIDER_SITE_OTHER): Payer: Medicare Other | Admitting: Pharmacist

## 2017-01-16 DIAGNOSIS — I482 Chronic atrial fibrillation, unspecified: Secondary | ICD-10-CM

## 2017-01-16 DIAGNOSIS — Z7901 Long term (current) use of anticoagulants: Secondary | ICD-10-CM

## 2017-01-16 LAB — COAGUCHEK XS/INR WAIVED
INR: 2.7 — AB (ref 0.9–1.1)
PROTHROMBIN TIME: 31.8 s

## 2017-01-17 ENCOUNTER — Telehealth: Payer: Self-pay | Admitting: Cardiology

## 2017-01-17 NOTE — Pre-Procedure Instructions (Signed)
    Susan Fitzgerald  01/17/2017      [place], [place] [address] [place] [address] Phone: [phone] Fax: [phone]  [place], [place] [address] [place] [address] Phone: [phone] Fax: [place], [place] [address] [place] [address] Phone: [phone] Fax: [phone]    Your procedure is scheduled on Monday, March 12th   Report to [facility] at 8:15 Am .  Call this number if you have problems the MORNING of surgery:  [phone], or [phone] Monday thru Friday from 8am-4pm   Remember:  Do not eat food or drink liquids after midnight Sunday.   Take these medicines the morning of surgery with A SIP OF WATER : Gabapentin              Coumadin will be stopped _________________________               4-5 days prior to surgery, STOP taking any Vitamins, Herbal Supplements, Anti-inflammatories.   Do not wear jewelry, make-up or nail polish.  Do not wear lotions, powders,  perfumes, or deoderant.  Do not shave underarms & legs 48 hours prior to surgery.     Do not bring valuables to the hospital.  [facility] is not responsible for any belongings or valuables.  Contacts, dentures or bridgework may not be worn into surgery.  Leave your suitcase in the car.  After surgery it may be brought to your room.  For patients admitted to the hospital, discharge time will be determined by your treatment team.  Please read over the following fact sheets that you were given. Pain Booklet, MRSA Information and Surgical Site Infection Prevention

## 2017-01-17 NOTE — Telephone Encounter (Signed)
New message      Request for surgical clearance:  1. What type of surgery is being performed? Hip surgery  2. When is this surgery scheduled? 01-27-17  Are there any medications that need to be held prior to surgery and how long? Stop coumadin prior to procedure? 3. Name of physician performing surgery?   4. What is your office phone and fax number?  Fax [phone]

## 2017-01-17 NOTE — Telephone Encounter (Signed)
Returned call to patient.Susan Fitzgerald's recommendations given.

## 2017-01-17 NOTE — Telephone Encounter (Signed)
Per protocol she can hold warfarin x 5 days prior to procedure.  CHADS2 score is 2 (hypertension, age), CHADS2-VASc is 50 (+ CAD, age, female).     Should not hold lisinopril unless recommended by surgeon.  Do not want elevated blood pressure at surgery time.

## 2017-01-17 NOTE — Telephone Encounter (Signed)
Returned call to patient she stated she will be having right hip replacement 01/27/17.She needs to know how long to hold coumadin and she said surgeon mentioned if she should hold Lisinopril.Message sent to our pharmacist for advice.

## 2017-01-20 ENCOUNTER — Encounter (HOSPITAL_COMMUNITY)
Admission: RE | Admit: 2017-01-20 | Discharge: 2017-01-20 | Disposition: A | Discharge status: Home / Self Care | Payer: Medicare Other | Source: Ambulatory Visit | Attending: Orthopedic Surgery | Admitting: Orthopedic Surgery

## 2017-01-20 ENCOUNTER — Encounter (HOSPITAL_COMMUNITY): Payer: Self-pay

## 2017-01-20 DIAGNOSIS — Z01812 Encounter for preprocedural laboratory examination: Secondary | ICD-10-CM | POA: Insufficient documentation

## 2017-01-20 HISTORY — DX: Cardiac murmur, unspecified: R01.1

## 2017-01-20 HISTORY — DX: Cardiac arrhythmia, unspecified: I49.9

## 2017-01-20 HISTORY — DX: Dyspnea, unspecified: R06.00

## 2017-01-20 LAB — TYPE AND SCREEN
ABO/RH(D): O NEG
Antibody Screen: NEGATIVE

## 2017-01-20 LAB — CBC
HCT: 39.6 % (ref 36.0–46.0)
HEMOGLOBIN: 12.8 g/dL (ref 12.0–15.0)
MCH: 31.8 pg (ref 26.0–34.0)
MCHC: 32.3 g/dL (ref 30.0–36.0)
MCV: 98.3 fL (ref 78.0–100.0)
PLATELETS: 198 10*3/uL (ref 150–400)
RBC: 4.03 MIL/uL (ref 3.87–5.11)
RDW: 15.5 % (ref 11.5–15.5)
WBC: 5.6 10*3/uL (ref 4.0–10.5)

## 2017-01-20 LAB — COMPREHENSIVE METABOLIC PANEL
ALBUMIN: 3.2 g/dL — AB (ref 3.5–5.0)
ALK PHOS: 67 U/L (ref 38–126)
ALT: 30 U/L (ref 14–54)
AST: 35 U/L (ref 15–41)
Anion gap: 6 (ref 5–15)
BUN: 16 mg/dL (ref 6–20)
CALCIUM: 9 mg/dL (ref 8.9–10.3)
CHLORIDE: 108 mmol/L (ref 101–111)
CO2: 26 mmol/L (ref 22–32)
CREATININE: 0.89 mg/dL (ref 0.44–1.00)
GFR calc non Af Amer: 59 mL/min — ABNORMAL LOW (ref >60)
GLUCOSE: 86 mg/dL (ref 65–99)
Potassium: 4 mmol/L (ref 3.5–5.1)
SODIUM: 140 mmol/L (ref 135–145)
Total Bilirubin: 0.6 mg/dL (ref 0.3–1.2)
Total Protein: 6.7 g/dL (ref 6.5–8.1)

## 2017-01-20 LAB — SURGICAL PCR SCREEN
MRSA, PCR: NEGATIVE
Staphylococcus aureus: NEGATIVE

## 2017-01-20 LAB — PROTIME-INR
INR: 2.35
Prothrombin Time: 26.1 seconds — ABNORMAL HIGH (ref 11.4–15.2)

## 2017-01-20 LAB — ABO/RH: ABO/RH(D): O NEG

## 2017-01-20 LAB — APTT: aPTT: 38 seconds — ABNORMAL HIGH (ref 24–36)

## 2017-01-20 NOTE — Progress Notes (Addendum)
Susan Fitzgerald is Susan Fitzgerald  LOV  10/2016   Was instructed by their office to take her lisinopril the morning of surgery. Echo TEE done 10/29/2016  Nuclear stress 03/2013.    Denies any cardiac issues today.  Does state that occasionally her ankles will swell if she stands on her legs all day.  (no swelling noted on today's visit)   Will stop coumadin on March 6th Had just seen Susan Fitzgerald at coumadin clinic 12/2016 PCP Dr. Alphonse Fitzgerald in [place] 12/2016 Rheumatologist is Susan Fitzgerald.  She will refrain from taking methotrexate prior to surgery.

## 2017-01-21 NOTE — Progress Notes (Signed)
Anesthesia Chart Review:  Pt is an 83 year old female scheduled for R total hip arthroplasty anterior approach on 01/27/2017 with Rod Can, MD.   - Cardiologist is Minus Breeding, MD, who has cleared pt for surgery.  - PCP is Redge Gainer, MD.   PMH includes:  PAF, heart murmur, HTN. Never smoker. BMI 25  BP (!) 141/86   Pulse 81   Temp 36.7 C   Resp 20   Ht 5' 1.5" (1.562 m)   Wt 135 lb 9.6 oz (61.5 kg)   SpO2 96%   BMI 25.21 kg/m   Medications include: diltiazem, lasix, lisinopril, methotrexate, pindolol, potassium, coumadin.  - Pt to hold coumadin 5 days before surgery.  -  I called and spoke with pt by telephone and instructed her to hold lisinopril DOS.   Preoperative labs reviewed.  PT 26.1, PTT 38. Will recheck DOS.   CXR 01/02/17: No active cardiopulmonary disease.  EKG 12/06/16: Atrial fibrillation with RVR (123 bpm). Left axis deviation. Left ventricular hypertrophy. Cannot rule out Septal infarct, age undetermined  TEE 10/30/16:  - Left ventricle: The cavity size was normal. Wall thickness was normal. The estimated ejection fraction was 50%. Diffuse hypokinesis. - Aortic valve: There was no stenosis. There was mild to moderate regurgitation. - Aorta: Normal caliber aorta with grade III plaque in the descending thoracic aorta. - Mitral valve: There was mild regurgitation. - Left atrium: The atrium was mildly dilated. No evidence of thrombus in the atrial cavity or appendage. - Right ventricle: The cavity size was mildly dilated. Systolic function was mildly reduced. - Right atrium: The atrium was moderately dilated. - Atrial septum: No defect or patent foramen ovale was identified. Echo contrast study showed no right-to-left atrial level shunt, at baseline or with provocation. - Tricuspid valve: There was moderate to severe tricuspid regurgitation with incomplete coaptation of the tricuspid valve. Peak RV-RA gradient 32 mmHg. - Pericardium, extracardiac: A trivial  pericardial effusion was identified. - Impressions: No LAA thrombus, may go for cardioversion.  AAA duplex 09/29/13: Small caliber abdominal aorta without focal dilatation.. Normal caliber common and external iliac arteries, bilaterally.  If PT/PTT acceptable DOS, I anticipate pt can proceed with surgery as scheduled.   Willeen Cass, FNP-BC [facility]/Anesthesiology Phone: [phone] 01/21/2017 3:41 PM

## 2017-01-24 MED ORDER — ACETAMINOPHEN 10 MG/ML IV SOLN
1000.0000 mg | INTRAVENOUS | Status: DC
  Administered 2017-01-27: 1000 mg via INTRAVENOUS

## 2017-01-24 MED ORDER — SODIUM CHLORIDE 0.9 % IV SOLN: INTRAVENOUS | Status: DC

## 2017-01-24 MED ORDER — TRANEXAMIC ACID 1000 MG/10ML IV SOLN
1000.0000 mg | INTRAVENOUS | Status: AC
  Administered 2017-01-27: 1000 mg via INTRAVENOUS
  Filled 2017-01-24: qty 10

## 2017-01-24 MED ORDER — CEFAZOLIN SODIUM-DEXTROSE 2-4 GM/100ML-% IV SOLN
2.0000 g | INTRAVENOUS | Status: AC
  Administered 2017-01-27: 2 g via INTRAVENOUS
  Filled 2017-01-24: qty 100

## 2017-01-27 ENCOUNTER — Inpatient Hospital Stay (HOSPITAL_COMMUNITY): Payer: Medicare Other

## 2017-01-27 ENCOUNTER — Inpatient Hospital Stay (HOSPITAL_COMMUNITY)
Admission: RE | Admit: 2017-01-27 | Discharge: 2017-01-29 | DRG: 470 | Disposition: A | Discharge status: Home Health | Payer: Medicare Other | Source: Ambulatory Visit | Attending: Orthopedic Surgery | Admitting: Orthopedic Surgery

## 2017-01-27 ENCOUNTER — Encounter (HOSPITAL_COMMUNITY)
Admission: RE | Disposition: A | Discharge status: Home Health | Payer: Self-pay | Source: Ambulatory Visit | Attending: Orthopedic Surgery

## 2017-01-27 ENCOUNTER — Inpatient Hospital Stay (HOSPITAL_COMMUNITY): Payer: Medicare Other | Admitting: Vascular Surgery

## 2017-01-27 ENCOUNTER — Encounter (HOSPITAL_COMMUNITY): Payer: Self-pay | Admitting: *Deleted

## 2017-01-27 ENCOUNTER — Inpatient Hospital Stay (HOSPITAL_COMMUNITY): Payer: Medicare Other | Admitting: Emergency Medicine

## 2017-01-27 DIAGNOSIS — Z888 Allergy status to other drugs, medicaments and biological substances status: Secondary | ICD-10-CM

## 2017-01-27 DIAGNOSIS — M069 Rheumatoid arthritis, unspecified: Secondary | ICD-10-CM | POA: Diagnosis not present

## 2017-01-27 DIAGNOSIS — Z7901 Long term (current) use of anticoagulants: Secondary | ICD-10-CM

## 2017-01-27 DIAGNOSIS — M8788 Other osteonecrosis, other site: Secondary | ICD-10-CM | POA: Diagnosis not present

## 2017-01-27 DIAGNOSIS — M879 Osteonecrosis, unspecified: Secondary | ICD-10-CM | POA: Diagnosis present

## 2017-01-27 DIAGNOSIS — I482 Chronic atrial fibrillation: Secondary | ICD-10-CM | POA: Diagnosis present

## 2017-01-27 DIAGNOSIS — I10 Essential (primary) hypertension: Secondary | ICD-10-CM | POA: Diagnosis present

## 2017-01-27 DIAGNOSIS — Z471 Aftercare following joint replacement surgery: Secondary | ICD-10-CM | POA: Diagnosis not present

## 2017-01-27 DIAGNOSIS — M06851 Other specified rheumatoid arthritis, right hip: Secondary | ICD-10-CM | POA: Diagnosis present

## 2017-01-27 DIAGNOSIS — M87051 Idiopathic aseptic necrosis of right femur: Secondary | ICD-10-CM | POA: Diagnosis present

## 2017-01-27 DIAGNOSIS — Z09 Encounter for follow-up examination after completed treatment for conditions other than malignant neoplasm: Secondary | ICD-10-CM

## 2017-01-27 DIAGNOSIS — M810 Age-related osteoporosis without current pathological fracture: Secondary | ICD-10-CM | POA: Diagnosis present

## 2017-01-27 DIAGNOSIS — Z419 Encounter for procedure for purposes other than remedying health state, unspecified: Secondary | ICD-10-CM

## 2017-01-27 DIAGNOSIS — Z96641 Presence of right artificial hip joint: Secondary | ICD-10-CM | POA: Diagnosis not present

## 2017-01-27 DIAGNOSIS — M1611 Unilateral primary osteoarthritis, right hip: Secondary | ICD-10-CM | POA: Diagnosis not present

## 2017-01-27 HISTORY — PX: TOTAL HIP ARTHROPLASTY: SHX124

## 2017-01-27 LAB — CBC
HCT: 36.7 % (ref 36.0–46.0)
HEMOGLOBIN: 11.8 g/dL — AB (ref 12.0–15.0)
MCH: 31.5 pg (ref 26.0–34.0)
MCHC: 32.2 g/dL (ref 30.0–36.0)
MCV: 97.9 fL (ref 78.0–100.0)
PLATELETS: 137 10*3/uL — AB (ref 150–400)
RBC: 3.75 MIL/uL — AB (ref 3.87–5.11)
RDW: 15.3 % (ref 11.5–15.5)
WBC: 10.8 10*3/uL — AB (ref 4.0–10.5)

## 2017-01-27 LAB — CREATININE, SERUM
CREATININE: 0.71 mg/dL (ref 0.44–1.00)
GFR calc non Af Amer: >60 mL/min (ref >60)

## 2017-01-27 LAB — PROTIME-INR
INR: 1.16
Prothrombin Time: 14.8 seconds (ref 11.4–15.2)

## 2017-01-27 LAB — APTT: aPTT: 28 seconds (ref 24–36)

## 2017-01-27 SURGERY — ARTHROPLASTY, HIP, TOTAL, ANTERIOR APPROACH
Anesthesia: Spinal | Site: Hip | Laterality: Right

## 2017-01-27 MED ORDER — HYDROCODONE-ACETAMINOPHEN 5-325 MG PO TABS
1.0000 | ORAL_TABLET | ORAL | Status: DC | PRN
  Administered 2017-01-27: 2 via ORAL
  Administered 2017-01-27 – 2017-01-29 (×5): 1 via ORAL
  Filled 2017-01-27: qty 1
  Filled 2017-01-27: qty 2
  Filled 2017-01-27 (×3): qty 1
  Filled 2017-01-27 (×2): qty 2

## 2017-01-27 MED ORDER — SODIUM CHLORIDE 0.9 % IJ SOLN
INTRAMUSCULAR | Status: DC | PRN
  Administered 2017-01-27: 30 mL via INTRAVENOUS

## 2017-01-27 MED ORDER — ONDANSETRON HCL 4 MG/2ML IJ SOLN
INTRAMUSCULAR | Status: DC | PRN
  Administered 2017-01-27: 4 mg via INTRAVENOUS

## 2017-01-27 MED ORDER — ACETAMINOPHEN 325 MG PO TABS
650.0000 mg | ORAL_TABLET | Freq: Four times a day (QID) | ORAL | Status: DC | PRN
Start: 2017-01-27 — End: 2017-01-29

## 2017-01-27 MED ORDER — DOCUSATE SODIUM 100 MG PO CAPS
100.0000 mg | ORAL_CAPSULE | Freq: Two times a day (BID) | ORAL | Status: DC
  Administered 2017-01-27 – 2017-01-29 (×5): 100 mg via ORAL
  Filled 2017-01-27 (×5): qty 1

## 2017-01-27 MED ORDER — SODIUM CHLORIDE 0.9 % IV SOLN
INTRAVENOUS | Status: DC | PRN
  Administered 2017-01-27: 1000 mL

## 2017-01-27 MED ORDER — FLUTICASONE PROPIONATE 50 MCG/ACT NA SUSP
2.0000 | Freq: Every day | NASAL | Status: DC | PRN
  Filled 2017-01-27: qty 16

## 2017-01-27 MED ORDER — FENTANYL CITRATE (PF) 100 MCG/2ML IJ SOLN
INTRAMUSCULAR | Status: DC | PRN
  Administered 2017-01-27: 25 ug via INTRAVENOUS

## 2017-01-27 MED ORDER — LACTATED RINGERS IV SOLN
INTRAVENOUS | Status: DC
  Administered 2017-01-27 (×2): via INTRAVENOUS

## 2017-01-27 MED ORDER — 0.9 % SODIUM CHLORIDE (POUR BTL) OPTIME
TOPICAL | Status: DC | PRN
  Administered 2017-01-27: 1000 mL

## 2017-01-27 MED ORDER — DEXAMETHASONE SODIUM PHOSPHATE 10 MG/ML IJ SOLN
INTRAMUSCULAR | Status: DC | PRN
  Administered 2017-01-27: 8 mg via INTRAVENOUS

## 2017-01-27 MED ORDER — MEPERIDINE HCL 25 MG/ML IJ SOLN: 6.2500 mg | INTRAMUSCULAR | Status: DC | PRN

## 2017-01-27 MED ORDER — ROCURONIUM BROMIDE 50 MG/5ML IV SOSY
PREFILLED_SYRINGE | INTRAVENOUS | Status: AC
  Filled 2017-01-27: qty 5

## 2017-01-27 MED ORDER — PINDOLOL 5 MG PO TABS
15.0000 mg | ORAL_TABLET | Freq: Every day | ORAL | Status: DC
  Administered 2017-01-28: 15 mg via ORAL
  Filled 2017-01-27 (×2): qty 1

## 2017-01-27 MED ORDER — CHLORHEXIDINE GLUCONATE 4 % EX LIQD: 60.0000 mL | Freq: Once | CUTANEOUS | Status: DC

## 2017-01-27 MED ORDER — LISINOPRIL 40 MG PO TABS
40.0000 mg | ORAL_TABLET | Freq: Every day | ORAL | Status: DC
  Administered 2017-01-27 – 2017-01-29 (×3): 40 mg via ORAL
  Filled 2017-01-27 (×3): qty 1

## 2017-01-27 MED ORDER — WARFARIN SODIUM 5 MG PO TABS
5.0000 mg | ORAL_TABLET | Freq: Once | ORAL | Status: AC
  Administered 2017-01-27: 5 mg via ORAL
  Filled 2017-01-27: qty 1

## 2017-01-27 MED ORDER — PHENOL 1.4 % MT LIQD: 1.0000 | OROMUCOSAL | Status: DC | PRN

## 2017-01-27 MED ORDER — FENTANYL CITRATE (PF) 100 MCG/2ML IJ SOLN
INTRAMUSCULAR | Status: AC
  Filled 2017-01-27: qty 2

## 2017-01-27 MED ORDER — METHOCARBAMOL 500 MG PO TABS
500.0000 mg | ORAL_TABLET | Freq: Four times a day (QID) | ORAL | Status: DC | PRN
  Administered 2017-01-28 (×3): 500 mg via ORAL
  Filled 2017-01-27 (×4): qty 1

## 2017-01-27 MED ORDER — GABAPENTIN 100 MG PO CAPS
100.0000 mg | ORAL_CAPSULE | Freq: Every day | ORAL | Status: DC
  Administered 2017-01-27 – 2017-01-28 (×2): 100 mg via ORAL
  Filled 2017-01-27 (×2): qty 1

## 2017-01-27 MED ORDER — KETOROLAC TROMETHAMINE 30 MG/ML IJ SOLN
INTRAMUSCULAR | Status: DC | PRN
  Administered 2017-01-27: 30 mg via INTRA_ARTICULAR

## 2017-01-27 MED ORDER — DEXAMETHASONE SODIUM PHOSPHATE 10 MG/ML IJ SOLN
10.0000 mg | Freq: Once | INTRAMUSCULAR | Status: AC
Start: 2017-01-28 — End: 2017-01-28
  Administered 2017-01-28: 10 mg via INTRAVENOUS
  Filled 2017-01-27: qty 1

## 2017-01-27 MED ORDER — ACETAMINOPHEN 10 MG/ML IV SOLN
INTRAVENOUS | Status: AC
  Filled 2017-01-27: qty 100

## 2017-01-27 MED ORDER — KETOROLAC TROMETHAMINE 30 MG/ML IJ SOLN
INTRAMUSCULAR | Status: AC
  Filled 2017-01-27: qty 1

## 2017-01-27 MED ORDER — TRANEXAMIC ACID 1000 MG/10ML IV SOLN
1000.0000 mg | Freq: Once | INTRAVENOUS | Status: AC
  Administered 2017-01-27: 1000 mg via INTRAVENOUS
  Filled 2017-01-27: qty 10

## 2017-01-27 MED ORDER — WARFARIN - PHARMACIST DOSING INPATIENT
Freq: Every day | Status: DC
  Administered 2017-01-27: 18:00:00

## 2017-01-27 MED ORDER — ONDANSETRON HCL 4 MG/2ML IJ SOLN: 4.0000 mg | Freq: Once | INTRAMUSCULAR | Status: DC | PRN

## 2017-01-27 MED ORDER — MENTHOL 3 MG MT LOZG: 1.0000 | LOZENGE | OROMUCOSAL | Status: DC | PRN

## 2017-01-27 MED ORDER — ENOXAPARIN SODIUM 30 MG/0.3ML ~~LOC~~ SOLN
30.0000 mg | Freq: Two times a day (BID) | SUBCUTANEOUS | Status: DC
  Administered 2017-01-28 – 2017-01-29 (×3): 30 mg via SUBCUTANEOUS
  Filled 2017-01-27 (×3): qty 0.3

## 2017-01-27 MED ORDER — PINDOLOL 10 MG PO TABS
10.0000 mg | ORAL_TABLET | Freq: Every day | ORAL | Status: DC
  Administered 2017-01-28 – 2017-01-29 (×2): 10 mg via ORAL
  Filled 2017-01-27 (×2): qty 1

## 2017-01-27 MED ORDER — ONDANSETRON HCL 4 MG/2ML IJ SOLN: 4.0000 mg | Freq: Four times a day (QID) | INTRAMUSCULAR | Status: DC | PRN

## 2017-01-27 MED ORDER — BUPIVACAINE-EPINEPHRINE (PF) 0.5% -1:200000 IJ SOLN
INTRAMUSCULAR | Status: DC | PRN
  Administered 2017-01-27: 30 mL

## 2017-01-27 MED ORDER — POLYETHYLENE GLYCOL 3350 17 G PO PACK: 17.0000 g | PACK | Freq: Every day | ORAL | Status: DC | PRN

## 2017-01-27 MED ORDER — FOLIC ACID 1 MG PO TABS
1.0000 mg | ORAL_TABLET | Freq: Every day | ORAL | Status: DC
  Administered 2017-01-27 – 2017-01-29 (×3): 1 mg via ORAL
  Filled 2017-01-27 (×3): qty 1

## 2017-01-27 MED ORDER — SODIUM CHLORIDE 0.9 % IR SOLN
Status: DC | PRN
Start: 2017-01-27 — End: 2017-01-27
  Administered 2017-01-27: 3000 mL

## 2017-01-27 MED ORDER — PROPOFOL 10 MG/ML IV BOLUS
INTRAVENOUS | Status: DC | PRN
  Administered 2017-01-27 (×2): 10 mg via INTRAVENOUS

## 2017-01-27 MED ORDER — PHENYLEPHRINE HCL 10 MG/ML IJ SOLN
INTRAVENOUS | Status: DC | PRN
  Administered 2017-01-27: 20 ug/min via INTRAVENOUS

## 2017-01-27 MED ORDER — ACETAMINOPHEN 650 MG RE SUPP: 650.0000 mg | Freq: Four times a day (QID) | RECTAL | Status: DC | PRN

## 2017-01-27 MED ORDER — LIDOCAINE 2% (20 MG/ML) 5 ML SYRINGE
INTRAMUSCULAR | Status: AC
  Filled 2017-01-27: qty 5

## 2017-01-27 MED ORDER — FENTANYL CITRATE (PF) 100 MCG/2ML IJ SOLN: 25.0000 ug | INTRAMUSCULAR | Status: DC | PRN

## 2017-01-27 MED ORDER — DILTIAZEM HCL ER COATED BEADS 180 MG PO CP24
180.0000 mg | ORAL_CAPSULE | Freq: Every day | ORAL | Status: DC
Start: 2017-01-28 — End: 2017-01-29
  Administered 2017-01-28 – 2017-01-29 (×2): 180 mg via ORAL
  Filled 2017-01-27 (×3): qty 1

## 2017-01-27 MED ORDER — CALCIUM CARBONATE-VITAMIN D 500-200 MG-UNIT PO TABS
0.5000 | ORAL_TABLET | Freq: Two times a day (BID) | ORAL | Status: DC
  Administered 2017-01-27 – 2017-01-29 (×4): 0.5 via ORAL
  Filled 2017-01-27 (×4): qty 1

## 2017-01-27 MED ORDER — ONDANSETRON HCL 4 MG/2ML IJ SOLN
INTRAMUSCULAR | Status: AC
  Filled 2017-01-27: qty 2

## 2017-01-27 MED ORDER — CEFAZOLIN IN D5W 1 GM/50ML IV SOLN
1.0000 g | Freq: Four times a day (QID) | INTRAVENOUS | Status: AC
  Administered 2017-01-27 – 2017-01-28 (×2): 1 g via INTRAVENOUS
  Filled 2017-01-27 (×2): qty 50

## 2017-01-27 MED ORDER — HYDROMORPHONE HCL 2 MG/ML IJ SOLN: 0.5000 mg | INTRAMUSCULAR | Status: DC | PRN

## 2017-01-27 MED ORDER — BUPIVACAINE-EPINEPHRINE (PF) 0.5% -1:200000 IJ SOLN
INTRAMUSCULAR | Status: AC
  Filled 2017-01-27: qty 30

## 2017-01-27 MED ORDER — PROPOFOL 500 MG/50ML IV EMUL
INTRAVENOUS | Status: DC | PRN
  Administered 2017-01-27: 50 ug/kg/min via INTRAVENOUS

## 2017-01-27 MED ORDER — SODIUM CHLORIDE 0.9 % IV SOLN
INTRAVENOUS | Status: DC
  Administered 2017-01-27 (×2): via INTRAVENOUS

## 2017-01-27 MED ORDER — ONDANSETRON HCL 4 MG PO TABS: 4.0000 mg | ORAL_TABLET | Freq: Four times a day (QID) | ORAL | Status: DC | PRN

## 2017-01-27 MED ORDER — POVIDONE-IODINE 10 % EX SWAB: 2.0000 "application " | Freq: Once | CUTANEOUS | Status: DC

## 2017-01-27 MED ORDER — FUROSEMIDE 20 MG PO TABS
20.0000 mg | ORAL_TABLET | Freq: Every day | ORAL | Status: DC
  Administered 2017-01-27 – 2017-01-29 (×3): 20 mg via ORAL
  Filled 2017-01-27 (×4): qty 1

## 2017-01-27 MED ORDER — POTASSIUM CHLORIDE CRYS ER 20 MEQ PO TBCR
20.0000 meq | EXTENDED_RELEASE_TABLET | Freq: Every day | ORAL | Status: DC
  Administered 2017-01-27 – 2017-01-29 (×3): 20 meq via ORAL
  Filled 2017-01-27 (×3): qty 1

## 2017-01-27 MED ORDER — METHOCARBAMOL 1000 MG/10ML IJ SOLN
500.0000 mg | Freq: Four times a day (QID) | INTRAVENOUS | Status: DC | PRN
  Filled 2017-01-27: qty 5

## 2017-01-27 MED ORDER — KETOROLAC TROMETHAMINE 15 MG/ML IJ SOLN
7.5000 mg | Freq: Four times a day (QID) | INTRAMUSCULAR | Status: AC
  Administered 2017-01-27 – 2017-01-28 (×2): 7.5 mg via INTRAVENOUS
  Filled 2017-01-27 (×2): qty 1

## 2017-01-27 MED ORDER — METOCLOPRAMIDE HCL 5 MG PO TABS: 5.0000 mg | ORAL_TABLET | Freq: Three times a day (TID) | ORAL | Status: DC | PRN

## 2017-01-27 MED ORDER — SENNA 8.6 MG PO TABS
2.0000 | ORAL_TABLET | Freq: Every day | ORAL | Status: DC
  Administered 2017-01-27 – 2017-01-28 (×2): 17.2 mg via ORAL
  Filled 2017-01-27 (×2): qty 2

## 2017-01-27 MED ORDER — METOCLOPRAMIDE HCL 5 MG/ML IJ SOLN: 5.0000 mg | Freq: Three times a day (TID) | INTRAMUSCULAR | Status: DC | PRN

## 2017-01-27 SURGICAL SUPPLY — 58 items
ADH SKN CLS APL DERMABOND .7 (GAUZE/BANDAGES/DRESSINGS) ×1
ALCOHOL ISOPROPYL (RUBBING) (MISCELLANEOUS) ×1 IMPLANT
BLADE CLIPPER SURG (BLADE) IMPLANT
CAPT HIP TOTAL 2 ×1 IMPLANT
CHLORAPREP W/TINT 26ML (MISCELLANEOUS) ×2 IMPLANT
COVER SURGICAL LIGHT HANDLE (MISCELLANEOUS) ×2 IMPLANT
DERMABOND ADVANCED (GAUZE/BANDAGES/DRESSINGS) ×1
DERMABOND ADVANCED .7 DNX12 (GAUZE/BANDAGES/DRESSINGS) ×2 IMPLANT
DRAPE C-ARM 42X72 X-RAY (DRAPES) ×2 IMPLANT
DRAPE STERI IOBAN 125X83 (DRAPES) ×2 IMPLANT
DRAPE U-SHAPE 47X51 STRL (DRAPES) ×5 IMPLANT
DRSG AQUACEL AG ADV 3.5X 6 (GAUZE/BANDAGES/DRESSINGS) ×1 IMPLANT
DRSG AQUACEL AG ADV 3.5X10 (GAUZE/BANDAGES/DRESSINGS) ×2 IMPLANT
ELECT BLADE 4.0 EZ CLEAN MEGAD (MISCELLANEOUS) ×2
ELECT REM PT RETURN 9FT ADLT (ELECTROSURGICAL) ×2
ELECTRODE BLDE 4.0 EZ CLN MEGD (MISCELLANEOUS) ×1 IMPLANT
ELECTRODE REM PT RTRN 9FT ADLT (ELECTROSURGICAL) ×1 IMPLANT
EVACUATOR 1/8 PVC DRAIN (DRAIN) IMPLANT
GLOVE BIO SURGEON STRL SZ8.5 (GLOVE) ×4 IMPLANT
GLOVE BIOGEL PI IND STRL 8 (GLOVE) IMPLANT
GLOVE BIOGEL PI IND STRL 8.5 (GLOVE) ×1 IMPLANT
GLOVE BIOGEL PI INDICATOR 8 (GLOVE) ×1
GLOVE BIOGEL PI INDICATOR 8.5 (GLOVE) ×1
GLOVE SURG SS PI 7.5 STRL IVOR (GLOVE) ×1 IMPLANT
GOWN STRL REUS W/ TWL LRG LVL3 (GOWN DISPOSABLE) ×2 IMPLANT
GOWN STRL REUS W/TWL 2XL LVL3 (GOWN DISPOSABLE) ×2 IMPLANT
GOWN STRL REUS W/TWL LRG LVL3 (GOWN DISPOSABLE) ×4
HANDPIECE INTERPULSE COAX TIP (DISPOSABLE) ×2
HOOD PEEL AWAY FACE SHEILD DIS (HOOD) ×4 IMPLANT
KIT BASIN OR (CUSTOM PROCEDURE TRAY) ×2 IMPLANT
KIT ROOM TURNOVER OR (KITS) ×2 IMPLANT
MANIFOLD NEPTUNE II (INSTRUMENTS) ×2 IMPLANT
MARKER SKIN DUAL TIP RULER LAB (MISCELLANEOUS) ×3 IMPLANT
NDL SPNL 18GX3.5 QUINCKE PK (NEEDLE) ×1 IMPLANT
NEEDLE SPNL 18GX3.5 QUINCKE PK (NEEDLE) ×2 IMPLANT
NS IRRIG 1000ML POUR BTL (IV SOLUTION) ×2 IMPLANT
PACK TOTAL JOINT (CUSTOM PROCEDURE TRAY) ×2 IMPLANT
PACK UNIVERSAL I (CUSTOM PROCEDURE TRAY) ×2 IMPLANT
PAD ARMBOARD 7.5X6 YLW CONV (MISCELLANEOUS) ×4 IMPLANT
SAW OSC TIP CART 19.5X105X1.3 (SAW) ×2 IMPLANT
SCREW 6.5MMX30MM (Screw) IMPLANT
SEALER BIPOLAR AQUA 6.0 (INSTRUMENTS) ×1 IMPLANT
SET HNDPC FAN SPRY TIP SCT (DISPOSABLE) ×1 IMPLANT
SOLUTION BETADINE 4OZ (MISCELLANEOUS) ×1 IMPLANT
SUT ETHIBOND NAB CT1 #1 30IN (SUTURE) ×4 IMPLANT
SUT MNCRL AB 3-0 PS2 18 (SUTURE) ×2 IMPLANT
SUT MON AB 2-0 CT1 36 (SUTURE) ×3 IMPLANT
SUT VIC AB 1 CT1 27 (SUTURE) ×2
SUT VIC AB 1 CT1 27XBRD ANBCTR (SUTURE) ×1 IMPLANT
SUT VIC AB 2-0 CT1 27 (SUTURE) ×2
SUT VIC AB 2-0 CT1 TAPERPNT 27 (SUTURE) ×1 IMPLANT
SUT VLOC 180 0 24IN GS25 (SUTURE) ×2 IMPLANT
SYR 50ML LL SCALE MARK (SYRINGE) ×2 IMPLANT
TOWEL OR 17X24 6PK STRL BLUE (TOWEL DISPOSABLE) ×1 IMPLANT
TOWEL OR 17X26 10 PK STRL BLUE (TOWEL DISPOSABLE) ×1 IMPLANT
TRAY CATH 16FR W/PLASTIC CATH (SET/KITS/TRAYS/PACK) IMPLANT
TRAY FOLEY CATH 16FR SILVER (SET/KITS/TRAYS/PACK) IMPLANT
WATER STERILE IRR 1000ML POUR (IV SOLUTION) ×3 IMPLANT

## 2017-01-27 NOTE — Anesthesia Preprocedure Evaluation (Addendum)
Anesthesia Evaluation  Patient identified by MRN, date of birth, ID band Patient awake    Reviewed: NPO status , Patient's Chart, lab work & pertinent test results, reviewed documented beta blocker date and time   Airway Mallampati: I       Dental no notable dental hx.    Pulmonary    Pulmonary exam normal        Cardiovascular hypertension, Pt. on medications Normal cardiovascular exam     Neuro/Psych negative neurological ROS  negative psych ROS   GI/Hepatic negative GI ROS, Neg liver ROS,   Endo/Other  negative endocrine ROS  Renal/GU negative Renal ROS     Musculoskeletal   Abdominal Normal abdominal exam  (+)   Peds  Hematology negative hematology ROS (+)   Anesthesia Other Findings Ordering physician: Sherran Needs, NP Study date: 10/30/16 Study Result   Result status: Final result                             *[facility]*                         [address], [place] [address]                            [phone]  ------------------------------------------------------------------- Transesophageal Echocardiography  Patient:    Susan Fitzgerald, Susan Fitzgerald MR #:       [id number] Study Date: 10/30/2016 Gender:     F Age:        83 Height:     157.5 cm Weight:     65.8 kg BSA:        1.71 m^2 Pt. Status: Room:   Kandis Cocking, Geroge Baseman  REFERRING    Sherran Needs  ADMITTING    Loralie Champagne, M.D.  ATTENDING    Loralie Champagne, M.D.  PERFORMING   Loralie Champagne, M.D.  SONOGRAPHER  Roseanna Rainbow  cc:  ------------------------------------------------------------------- LV EF: 50%  ------------------------------------------------------------------- Indications:      Atrial fibrillation - 427.31.  ------------------------------------------------------------------- History:   Risk factors:   Hypertension.  ------------------------------------------------------------------- Study Conclusions  - Left ventricle: The cavity size was normal. Wall thickness was   normal. The estimated ejection fraction was 50%. Diffuse   hypokinesis. - Aortic valve: There was no stenosis. There was mild to moderate   regurgitation. - Aorta: Normal caliber aorta with grade III plaque in the   descending thoracic aorta. - Mitral valve: There was mild regurgitation. - Left atrium: The atrium was mildly dilated. No evidence of   thrombus in the atrial cavity or appendage. - Right ventricle: The cavity size was mildly dilated. Systolic   function was mildly reduced. - Right atrium: The atrium was moderately dilated. - Atrial septum: No defect or patent foramen ovale was identified.   Echo contrast study showed no right-to-left atrial level shunt,   at baseline or with provocation. - Tricuspid valve: There was moderate to severe tricuspid   regurgitation with incomplete coaptation of the tricuspid valve.   Peak RV-RA gradient 32 mmHg. - Pericardium, extracardiac: A trivial  pericardial effusion was   identified.  Impressions:  - No LAA thrombus, may     Reproductive/Obstetrics                           Anesthesia Physical Anesthesia Plan  ASA: II  Anesthesia Plan: Spinal   Post-op Pain Management:    Induction:   Airway Management Planned:   Additional Equipment:   Intra-op Plan:   Post-operative Plan:   Informed Consent: I have reviewed the patients History and Physical, chart, labs and discussed the procedure including the risks, benefits and alternatives for the proposed anesthesia with the patient or authorized representative who has indicated his/her understanding and acceptance.     Plan Discussed with: CRNA and Surgeon  Anesthesia Plan Comments:        Anesthesia Quick Evaluation

## 2017-01-27 NOTE — Interval H&P Note (Signed)
History and Physical Interval Note:  01/27/2017 9:46 AM  Susan Fitzgerald  has presented today for surgery, with the diagnosis of Avascular necrosis right hip  The various methods of treatment have been discussed with the patient and family. After consideration of risks, benefits and other options for treatment, the patient has consented to  Procedure(s): TOTAL HIP ARTHROPLASTY ANTERIOR APPROACH (Right) as a surgical intervention .  The patient's history has been reviewed, patient examined, no change in status, stable for surgery.  I have reviewed the patient's chart and labs.  Questions were answered to the patient's satisfaction.     Roxy Mastandrea, Horald Pollen

## 2017-01-27 NOTE — Transfer of Care (Signed)
Immediate Anesthesia Transfer of Care Note  Patient: Susan Fitzgerald  Procedure(s) Performed: Procedure(s): TOTAL HIP ARTHROPLASTY ANTERIOR APPROACH (Right)  Patient Location: PACU  Anesthesia Type:Spinal  Level of Consciousness: awake, oriented, sedated, patient cooperative and responds to stimulation  Airway & Oxygen Therapy: Patient Spontanous Breathing and Patient connected to face mask oxygen  Post-op Assessment: Report given to RN and Post -op Vital signs reviewed and stable  Post vital signs: Reviewed and stable  Last Vitals:  Vitals:   01/27/17 0807  BP: (!) 142/92  Pulse: 97  Resp: (!) 22  Temp: 36.7 C    Last Pain:  Vitals:   01/27/17 0807  TempSrc: Oral      Patients Stated Pain Goal: 6 (59/13/68 5992)  Complications: No apparent anesthesia complications

## 2017-01-27 NOTE — Op Note (Signed)
OPERATIVE REPORT  SURGEON: Rod Can, MD   ASSISTANT: April Green, RNFA.  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   POSTOPERATIVE DIAGNOSIS: Right hip avascular necrosis with collapse and arthritis.   PROCEDURE: Right total hip arthroplasty, anterior approach.   IMPLANTS: DePuy Tri Lock stem, size 5, hi offset. DePuy Pinnacle Cup, size 54 mm. DePuy Altrx liner, size 36 by 54 mm, +4 neutral. DePuy Biolox ceramic head ball, size 36 + 5 mm. 6.5 x 30 mm cancellus bone screw 2.  ANESTHESIA:  Spinal  ESTIMATED BLOOD LOSS: 400 mL.  ANTIBIOTICS: 2 g Ancef.  DRAINS: None.  COMPLICATIONS: None.   CONDITION: PACU - hemodynamically stable.  BRIEF CLINICAL NOTE: Susan Fitzgerald is a 83 y.o. female with a long-standing history of Right hip arthritis secondary to avascular necrosis. After failing conservative management, the patient was indicated for total hip arthroplasty. The risks, benefits, and alternatives to the procedure were explained, and the patient elected to proceed.  PROCEDURE IN DETAIL: Surgical site was marked by myself. Spinal anesthesia was obtained in the pre-op holding area. Once inside the operative room, a foley catheter was inserted. The patient was then positioned on the Hana table. All bony prominences were well padded. The hip was prepped and draped in the normal sterile surgical fashion. A time-out was called verifying side and site of surgery. The patient received IV antibiotics within 60 minutes of beginning the procedure.  The direct anterior approach to the hip was performed through the Hueter interval. Lateral femoral circumflex vessels were treated with the Auqumantys. The anterior capsule was exposed and an inverted T capsulotomy was made.The femoral neck cut was made to the level of the templated cut. A corkscrew was placed into the head and the head was removed. The femoral head was found to be  collapsed with eburnated bone. The head was passed to the back table and was measured.  Acetabular exposure was achieved, and the pulvinar and labrum were excised. Sequental reaming of the acetabulum was then performed up to a size 53 mm reamer. A 54 mm cup was then opened and impacted into place at approximately 40 degrees of abduction and 20 degrees of anteversion. I elected to augment the already acceptable press-fit fixation with 2 cancellous bone screws. The final polyethylene liner was impacted into place and acetabular osteophytes were removed.   I then gained femoral exposure taking care to protect the abductors and greater trochanter. This was performed using standard external rotation, extension, and adduction. The capsule was peeled off the inner aspect of the greater trochanter, taking care to preserve the short external rotators. A cookie cutter was used to enter the femoral canal, and then the femoral canal finder was placed. Sequential broaching was performed up to a size 5. Calcar planer was used on the femoral neck remnant. I placed a hi offset neck and a trial head ball. The hip was reduced. Leg lengths and offset were checked fluoroscopically. In order to achieve appropriate soft tissue stability, the leg was lengthened a few millimeters. The hip was dislocated and trial components were removed. The final implants were placed, and the hip was reduced.  Fluoroscopy was used to confirm component position and leg lengths. At 90 degrees of external rotation and full extension, the hip was stable to an anterior directed force.  The wound was copiously irrigated with a dilute betadine solution followed by normal saline. Marcaine solution was injected into the periarticular soft tissue. The wound was  closed in layers using #1 Vicryl and V-Loc for the fascia, 2-0 Vicryl for the subcutaneous fat, 2-0 Monocryl for the deep dermal layer, 3-0 running Monocryl subcuticular stitch, and  Dermabond for the skin. Once the glue was fully dried, an Aquacell Ag dressing was applied. The patient was transported to the recovery room in stable condition. Sponge, needle, and instrument counts were correct at the end of the case x2. The patient tolerated the procedure well and there were no known complications.

## 2017-01-27 NOTE — Progress Notes (Addendum)
ANTICOAGULATION CONSULT NOTE - Initial Consult  Pharmacy Consult for Coumadin Indication: atrial fibrillation  Allergies  Allergen Reactions  . Benazepril Hcl Cough    Patient Measurements:   Heparin Dosing Weight:    Vital Signs: Temp: 97.7 F (36.5 C) (03/12 1454) Temp Source: Oral (03/12 0807) BP: 140/78 (03/12 1409) Pulse Rate: 95 (03/12 1445)  Labs:  Recent Labs  01/27/17 0815  APTT 28  LABPROT 14.8  INR 1.16    Estimated Creatinine Clearance: 41.5 mL/min (by C-G formula based on SCr of 0.89 mg/dL).   Medical History: Past Medical History:  Diagnosis Date  . Arthritis   . Bradycardia 2014  . Cataract   . Diverticulosis   . Dyspnea    periodically  . Dysrhythmia    a - fib  . Heart murmur    ??? bruit  . History of stress test    a. 04/09/13-nuclear stress-no ischemia low risk  . Hypertension   . Menopause   . Osteoporosis   . PAF (paroxysmal atrial fibrillation) ([place])    a. s/p prior DCCV; b. On flecainide & coumadin (CHA2DS2VASc = 4);  c. 03/2016 Echo: EF 55-60%, mod LVH, mod AI, mild to mod TR, PASP 87mmHg.  Marland Kitchen Pelvic fracture [facility])     Assessment: CC/HPI: R THA 01/27/17  PMH: R hip arthritis, RA, bradycardia, HTN, OP, Afib, abdom aortic atherosclerosis.  Anticoag: Chronic afib. INR 1.16 today post-op. Baseline Hgb 12.8 ok. - Home Coum 2.5mg  MF, 5mg  other days (INR 2.35 on 3/5) last dose "past week" on med rec  Goal of Therapy:  INR 2-3 Monitor platelets by anticoagulation protocol: Yes   Plan:  Resume Coumadin 5mg  po x 1 tonight Daily INR   Aariyah Sampey S. Alford Highland, PharmD, BCPS Clinical Staff Pharmacist Pager [phone]  Eilene Ghazi Stillinger 01/27/2017,3:39 PM

## 2017-01-27 NOTE — Anesthesia Procedure Notes (Signed)
Spinal  Patient location during procedure: OR Start time: 01/27/2017 10:45 AM End time: 01/27/2017 10:48 AM Staffing Anesthesiologist: Lyn Hollingshead Performed: anesthesiologist  Preanesthetic Checklist Completed: patient identified, surgical consent, pre-op evaluation, timeout performed, IV checked, risks and benefits discussed and monitors and equipment checked Spinal Block Patient position: sitting Prep: site prepped and draped and DuraPrep Patient monitoring: heart rate, cardiac monitor, continuous pulse ox and blood pressure Approach: midline Location: L3-4 Injection technique: single-shot Needle Needle type: Pencan  Needle gauge: 24 G Needle length: 9 cm Needle insertion depth: 5 cm Assessment Sensory level: T8

## 2017-01-27 NOTE — Anesthesia Procedure Notes (Signed)
Procedure Name: MAC Date/Time: 01/27/2017 10:30 AM Performed by: Izora Gala Pre-anesthesia Checklist: Patient identified, Suction available and Patient being monitored Patient Re-evaluated:Patient Re-evaluated prior to inductionOxygen Delivery Method: Simple face mask and Nasal cannula Intubation Type: IV induction Placement Confirmation: positive ETCO2

## 2017-01-27 NOTE — H&P (View-Only) (Signed)
TOTAL HIP ADMISSION H&P  Patient is admitted for right total hip arthroplasty.  Subjective:  Chief Complaint: right hip pain  HPI: Susan Fitzgerald, 83 y.o. female, has a history of pain and functional disability in the right hip(s) due to arthritis and patient has failed non-surgical conservative treatments for greater than 12 weeks to include NSAID's and/or analgesics, corticosteriod injections, flexibility and strengthening excercises, use of assistive devices, weight reduction as appropriate and activity modification.  Onset of symptoms was abrupt starting 2 years ago with rapidlly worsening course since that time.The patient noted no past surgery on the right hip(s).  Patient currently rates pain in the right hip at 10 out of 10 with activity. Patient has night pain, worsening of pain with activity and weight bearing, pain that interfers with activities of daily living, pain with passive range of motion and crepitus. Patient has evidence of subchondral cysts, subchondral sclerosis, periarticular osteophytes, joint space narrowing and AVN with collapse by imaging studies. This condition presents safety issues increasing the risk of falls.  There is no current active infection.  Patient Active Problem List   Diagnosis Date Noted  . Abdominal aortic atherosclerosis ([place]) 09/24/2016  . Rheumatoid arthritis ([place]) 05/07/2016  . Symptomatic bradycardia 06/02/2013  . Hypertension 04/21/2013  . Osteoporosis 04/21/2013  . Atrial fibrillation, chronic ([place]) 02/09/2013   Past Medical History:  Diagnosis Date  . Arthritis   . Bradycardia 2014  . Cataract   . Diverticulosis   . History of stress test    a. 04/09/13-nuclear stress-no ischemia low risk  . Hypertension   . Menopause   . Osteoporosis   . PAF (paroxysmal atrial fibrillation) ([place])    a. s/p prior DCCV; b. On flecainide & coumadin (CHA2DS2VASc = 4);  c. 03/2016 Echo: EF 55-60%, mod LVH, mod AI, mild to mod TR, PASP 2mmHg.  Marland Kitchen Pelvic  fracture [facility])     Past Surgical History:  Procedure Laterality Date  . BIOPSY BREAST    . CARDIOVERSION N/A 10/30/2016   Procedure: CARDIOVERSION;  Surgeon: Larey Dresser, MD;  Location: [employer];  Service: Cardiovascular;  Laterality: N/A;  . CATARACT EXTRACTION    . EYE SURGERY    . TEE WITHOUT CARDIOVERSION N/A 10/30/2016   Procedure: TRANSESOPHAGEAL ECHOCARDIOGRAM (TEE);  Surgeon: Larey Dresser, MD;  [facility]COPY;  Service: Cardiovascular;  Laterality: N/A;     (Not in a hospital admission) Allergies  Allergen Reactions  . Benazepril Hcl Cough    Social History  Substance Use Topics  . Smoking status: Never Smoker  . Smokeless tobacco: Never Used  . Alcohol use No    Family History  Problem Relation Age of Onset  . Stroke Mother     cerebral hemorrhage  . Heart disease Father     MI  . Heart attack Father   . Vision loss Father   . Heart disease Brother   . Heart attack Brother   . Cancer Maternal Aunt     breast  . Cancer Brother   . Heart disease Brother   . Cancer Daughter     breast     Review of Systems  Constitutional: Negative.   HENT: Positive for tinnitus. Negative for congestion, ear discharge, ear pain, hearing loss, nosebleeds, sinus pain and sore throat.   Eyes: Negative.   Respiratory: Negative.  Negative for stridor.   Cardiovascular: Negative.   Gastrointestinal: Negative.   Genitourinary: Negative.   Musculoskeletal: Positive for back pain and joint pain.  Skin: Negative.   Neurological: Negative.   Endo/Heme/Allergies: Negative.   Psychiatric/Behavioral: Negative.     Objective:  Physical Exam  Vitals reviewed. Constitutional: She is oriented to person, place, and time. She appears well-developed and well-nourished.  HENT:  Head: Normocephalic and atraumatic.  Eyes: Conjunctivae and EOM are normal. Pupils are equal, round, and reactive to light.  Neck: Normal range of motion. Neck supple.  Cardiovascular:  Normal rate and intact distal pulses.   Respiratory: Effort normal. No respiratory distress.  GI: Soft. She exhibits no distension.  Genitourinary:  Genitourinary Comments: deferred  Musculoskeletal:       Right hip: She exhibits decreased range of motion, bony tenderness and crepitus.  Neurological: She is alert and oriented to person, place, and time. She has normal reflexes.  Skin: Skin is warm and dry.  Psychiatric: She has a normal mood and affect. Her behavior is normal. Judgment and thought content normal.    Vital signs in last 24 hours: @VSRANGES @  Labs:   Estimated body mass index is 24.47 kg/m as calculated from the following:   Height as of 01/02/17: 5\' 2"  (1.575 m).   Weight as of 01/02/17: 60.7 kg (133 lb 12.8 oz).   Imaging Review Plain radiographs demonstrate severe degenerative joint disease of the right hip(s). The bone quality appears to be adequate for age and reported activity level.  Assessment/Plan:  End stage arthritis, right hip(s)  The patient history, physical examination, clinical judgement of the provider and imaging studies are consistent with end stage degenerative joint disease of the right hip(s) and total hip arthroplasty is deemed medically necessary. The treatment options including medical management, injection therapy, arthroscopy and arthroplasty were discussed at length. The risks and benefits of total hip arthroplasty were presented and reviewed. The risks due to aseptic loosening, infection, stiffness, dislocation/subluxation,  thromboembolic complications and other imponderables were discussed.  The patient acknowledged the explanation, agreed to proceed with the plan and consent was signed. Patient is being admitted for inpatient treatment for surgery, pain control, PT, OT, prophylactic antibiotics, VTE prophylaxis, progressive ambulation and ADL's and discharge planning.The patient is planning to be discharged home with HEP. Chronic coumadin,  MTX

## 2017-01-27 NOTE — Anesthesia Postprocedure Evaluation (Addendum)
Anesthesia Post Note  Patient: Susan Fitzgerald  Procedure(s) Performed: Procedure(s) (LRB): TOTAL HIP ARTHROPLASTY ANTERIOR APPROACH (Right)  Patient location during evaluation: PACU Anesthesia Type: Spinal Level of consciousness: awake Pain management: pain level controlled Vital Signs Assessment: post-procedure vital signs reviewed and stable Respiratory status: spontaneous breathing Cardiovascular status: stable Postop Assessment: no headache, no backache, spinal receding, patient able to bend at knees and no signs of nausea or vomiting Anesthetic complications: no        Last Vitals:  Vitals:   01/27/17 1354 01/27/17 1400  BP: 138/74   Pulse: 67 86  Resp: 18 20  Temp:      Last Pain:  Vitals:   01/27/17 0807  TempSrc: Oral   Pain Goal: Patients Stated Pain Goal: 6 (01/27/17 0828)    LLE Sensation: Decreased (01/27/17 1354)   RLE Sensation: Decreased (01/27/17 1354) L Sensory Level: L3-Anterior knee, lower leg (01/27/17 1354) R Sensory Level: L3-Anterior knee, lower leg (dull sensation) (01/27/17 1354)  [place]

## 2017-01-27 NOTE — Discharge Summary (Signed)
Physician Discharge Summary  Patient ID: Susan Fitzgerald MRN: [id number] DOB/AGE: 17-Feb-1932 83 y.o.  Admit date: 01/27/2017 Discharge date: 01/29/2017  Admission Diagnoses:  Avascular necrosis of hip, right [facility])  Discharge Diagnoses:  Principal Problem:   Avascular necrosis of hip, right ([place]) Active Problems:   Avascular necrosis of bone of right hip [facility])   Past Medical History:  Diagnosis Date  . Arthritis   . Bradycardia 2014  . Cataract   . Diverticulosis   . Dyspnea    periodically  . Dysrhythmia    a - fib  . Heart murmur    ??? bruit  . History of stress test    a. 04/09/13-nuclear stress-no ischemia low risk  . Hypertension   . Menopause   . Osteoporosis   . PAF (paroxysmal atrial fibrillation) ([place])    a. s/p prior DCCV; b. On flecainide & coumadin (CHA2DS2VASc = 4);  c. 03/2016 Echo: EF 55-60%, mod LVH, mod AI, mild to mod TR, PASP 6mmHg.  Marland Kitchen Pelvic fracture ([facility])     Surgeries: Procedure(s): TOTAL HIP ARTHROPLASTY ANTERIOR APPROACH on 01/27/2017   Consultants (if any):   Discharged Condition: Improved  Hospital Course: Susan Fitzgerald is an 83 y.o. female who was admitted 01/27/2017 with a diagnosis of Avascular necrosis of hip, right ([place]) and went to the operating room on 01/27/2017 and underwent the above named procedures.    She was given perioperative antibiotics:  Anti-infectives    Start     Dose/Rate Route Frequency Ordered Stop   01/27/17 1645  ceFAZolin (ANCEF) IVPB 1 g/50 mL premix     1 g 100 mL/hr over 30 Minutes Intravenous Every 6 hours 01/27/17 1321 01/28/17 0128   01/27/17 1000  ceFAZolin (ANCEF) IVPB 2g/100 mL premix     2 g 200 mL/hr over 30 Minutes Intravenous To ShortStay Surgical 01/24/17 1018 01/27/17 1110    .  She was given sequential compression devices, early ambulation, and coumadin with lovenox bridge for DVT prophylaxis.  She benefited maximally from the hospital stay and there were no complications.    Recent vital  signs:  Vitals:   01/28/17 2146 01/29/17 1107  BP:  122/70  Pulse: 75 72  Resp:    Temp:      Recent laboratory studies:  Lab Results  Component Value Date   HGB 9.4 (L) 01/29/2017   HGB 10.1 (L) 01/28/2017   HGB 11.8 (L) 01/27/2017   Lab Results  Component Value Date   WBC 11.8 (H) 01/29/2017   PLT 106 (L) 01/29/2017   Lab Results  Component Value Date   INR 1.46 01/29/2017   Lab Results  Component Value Date   NA 140 01/28/2017   K 3.8 01/28/2017   CL 110 01/28/2017   CO2 24 01/28/2017   BUN 17 01/28/2017   CREATININE 0.69 01/28/2017   GLUCOSE 146 (H) 01/28/2017    Discharge Medications:   Allergies as of 01/29/2017      Reactions   Benazepril Hcl Cough      Medication List    STOP taking these medications   acetaminophen 500 MG tablet Commonly known as:  TYLENOL   diclofenac sodium 1 % Gel Commonly known as:  VOLTAREN   methotrexate 2.5 MG tablet Commonly known as:  RHEUMATREX     TAKE these medications   calcium-vitamin D 250-125 MG-UNIT tablet Commonly known as:  OSCAL WITH D Take 1 tablet by mouth 2 (two) times daily.   diltiazem 180  MG 24 hr capsule Commonly known as:  CARDIZEM CD Take 1 capsule (180 mg total) by mouth daily.   diltiazem 30 MG tablet Commonly known as:  CARDIZEM Take 1 tablet every 4 hours AS NEEDED for heart rate >100 as long as blood pressure >100.   docusate sodium 100 MG capsule Commonly known as:  COLACE Take 1 capsule (100 mg total) by mouth 2 (two) times daily.   enoxaparin 30 MG/0.3ML injection Commonly known as:  LOVENOX Inject 0.3 mLs (30 mg total) into the skin every 12 (twelve) hours.   fluticasone 50 MCG/ACT nasal spray Commonly known as:  FLONASE Place 2 sprays into both nostrils daily as needed.   folic acid 1 MG tablet Commonly known as:  FOLVITE Take 1 mg by mouth daily.   furosemide 20 MG tablet Commonly known as:  LASIX Take 1 to 2 tablets by  mouth daily as needed What changed:  See the  new instructions.   gabapentin 100 MG capsule Commonly known as:  NEURONTIN Take 1 capsule (100 mg total) by mouth at bedtime.   HYDROcodone-acetaminophen 5-325 MG tablet Commonly known as:  NORCO/VICODIN Take 1-2 tablets by mouth every 4 (four) hours as needed (breakthrough pain).   lisinopril 40 MG tablet Commonly known as:  PRINIVIL,ZESTRIL TAKE 1 TABLET BY MOUTH  DAILY   multivitamin capsule Take 1 capsule by mouth daily.   ondansetron 4 MG tablet Commonly known as:  ZOFRAN Take 1 tablet (4 mg total) by mouth every 6 (six) hours as needed for nausea.   pindolol 5 MG tablet Commonly known as:  VISKEN Take 10-15 mg by mouth See admin instructions. 10 mg in the morning and 15 mg in the evening   potassium chloride SA 20 MEQ tablet Commonly known as:  K-DUR,KLOR-CON TAKE 1 TABLET BY MOUTH  DAILY   senna 8.6 MG Tabs tablet Commonly known as:  SENOKOT Take 2 tablets (17.2 mg total) by mouth at bedtime.   warfarin 5 MG tablet Commonly known as:  COUMADIN Take 0.5-1 tablets (2.5-5 mg total) by mouth daily. Takes 2.5 mg on Mondays and Fridays and 5 mg all other days of the week            Durable Medical Equipment        Start     Ordered   01/28/17 1454  DME Walker rolling  Once    Comments:  Patient needs youth walker  Question:  Patient needs a walker to treat with the following condition  Answer:  Status post total hip replacement, right   01/28/17 1453   01/27/17 1527  DME 3 n 1  Once     01/27/17 1526      Diagnostic Studies: Dg Chest 2 View  Result Date: 01/02/2017 CLINICAL DATA:  Cough. EXAM: CHEST  2 VIEW COMPARISON:  Radiograph of February 26, 2015. FINDINGS: Stable cardiomediastinal silhouette. No pneumothorax or pleural effusion is noted. Both lungs are clear. The visualized skeletal structures are unremarkable. IMPRESSION: No active cardiopulmonary disease. Electronically Signed   By: Marijo Conception, M.D.   On: 01/02/2017 16:53   Dg Pelvis  Portable  Result Date: 01/27/2017 CLINICAL DATA:  Right hip arthroplasty. EXAM: PORTABLE PELVIS 1-2 VIEWS COMPARISON:  01/27/2017. FINDINGS: Total right hip replacement. Hardware intact. Anatomic alignment. Deformity noted of the pubis bilaterally consistent with old healed fractures. IMPRESSION: 1. Total right hip replacement. Anatomic alignment. Hardware intact. 2. Deformity of the pubis bilaterally consistent with old fractures. Electronically Signed  By: [place]   On: 01/27/2017 13:45   Dg C-arm 61-120 Min  Result Date: 01/27/2017 CLINICAL DATA:  Right sided anterior approach total hip arthroplasty. Reported fluoro time is 32 seconds EXAM: OPERATIVE right HIP (WITH PELVIS IF PERFORMED) 2 fluoro spot VIEWS TECHNIQUE: Fluoroscopic spot image(s) were submitted for interpretation post-operatively. COMPARISON:  None in PACs FINDINGS: The patient has undergone right total hip joint prosthesis placement. Radiographic positioning of the prosthetic components is good. The interface with the native bone appears normal. IMPRESSION: No immediate postprocedure complication following right total hip arthroplasty. Electronically Signed   By: David  Martinique M.D.   On: 01/27/2017 13:01   Dg Hip Operative Unilat W Or W/o Pelvis Right  Result Date: 01/27/2017 CLINICAL DATA:  Right sided anterior approach total hip arthroplasty. Reported fluoro time is 32 seconds EXAM: OPERATIVE right HIP (WITH PELVIS IF PERFORMED) 2 fluoro spot VIEWS TECHNIQUE: Fluoroscopic spot image(s) were submitted for interpretation post-operatively. COMPARISON:  None in PACs FINDINGS: The patient has undergone right total hip joint prosthesis placement. Radiographic positioning of the prosthetic components is good. The interface with the native bone appears normal. IMPRESSION: No immediate postprocedure complication following right total hip arthroplasty. Electronically Signed   By: David  Martinique M.D.   On: 01/27/2017 13:01     Disposition: 01-Home or Self Care  Discharge Instructions    Call MD / Call 911    Complete by:  As directed    If you experience chest pain or shortness of breath, CALL 911 and be transported to the hospital emergency room.  If you develope a fever above 101 F, pus (white drainage) or increased drainage or redness at the wound, or calf pain, call your surgeon's office.   Constipation Prevention    Complete by:  As directed    Drink plenty of fluids.  Prune juice may be helpful.  You may use a stool softener, such as Colace (over the counter) 100 mg twice a day.  Use MiraLax (over the counter) for constipation as needed.   Diet - low sodium heart healthy    Complete by:  As directed    Discharge instructions    Complete by:  As directed    Resume normal warfarin dosing Discontinue lovenox injections when your INR is greater than 1.8 Report to primary care physician for INR check this Friday   Driving restrictions    Complete by:  As directed    No driving for 6 weeks   Increase activity slowly as tolerated    Complete by:  As directed    Lifting restrictions    Complete by:  As directed    No lifting for 6 weeks   TED hose    Complete by:  As directed    Use stockings (TED hose) for 2 weeks on both leg(s).  You may remove them at night for sleeping.      Follow-up Information    Samarion Ehle, Horald Pollen, MD. Schedule an appointment as soon as possible for a visit in 2 week(s).   Specialty:  Orthopedic Surgery Why:  For wound re-check Contact information: [address] [place] [address] [phone]        [facility] Follow up.   Specialty:  [place] Why:  A representative from [facility] will contact you to arrange start date and time for therapy. Contact information: [address] [place] [address] [phone]  Signed: Elie Goody 01/29/2017, 12:55 PM

## 2017-01-28 ENCOUNTER — Encounter (HOSPITAL_COMMUNITY): Payer: Self-pay | Admitting: Orthopedic Surgery

## 2017-01-28 LAB — CBC
HCT: 30.9 % — ABNORMAL LOW (ref 36.0–46.0)
HEMOGLOBIN: 10.1 g/dL — AB (ref 12.0–15.0)
MCH: 31.8 pg (ref 26.0–34.0)
MCHC: 32.7 g/dL (ref 30.0–36.0)
MCV: 97.2 fL (ref 78.0–100.0)
Platelets: 108 10*3/uL — ABNORMAL LOW (ref 150–400)
RBC: 3.18 MIL/uL — AB (ref 3.87–5.11)
RDW: 15.5 % (ref 11.5–15.5)
WBC: 9.2 10*3/uL (ref 4.0–10.5)

## 2017-01-28 LAB — BASIC METABOLIC PANEL
Anion gap: 6 (ref 5–15)
BUN: 17 mg/dL (ref 6–20)
CHLORIDE: 110 mmol/L (ref 101–111)
CO2: 24 mmol/L (ref 22–32)
Calcium: 8.3 mg/dL — ABNORMAL LOW (ref 8.9–10.3)
Creatinine, Ser: 0.69 mg/dL (ref 0.44–1.00)
GFR calc Af Amer: >60 mL/min (ref >60)
GFR calc non Af Amer: >60 mL/min (ref >60)
GLUCOSE: 146 mg/dL — AB (ref 65–99)
POTASSIUM: 3.8 mmol/L (ref 3.5–5.1)
Sodium: 140 mmol/L (ref 135–145)

## 2017-01-28 LAB — PROTIME-INR
INR: 1.3
Prothrombin Time: 16.2 seconds — ABNORMAL HIGH (ref 11.4–15.2)

## 2017-01-28 MED ORDER — SENNA 8.6 MG PO TABS: 2.0000 | ORAL_TABLET | Freq: Every day | ORAL | 0 refills | Status: DC

## 2017-01-28 MED ORDER — HYDROCODONE-ACETAMINOPHEN 5-325 MG PO TABS: 1.0000 | ORAL_TABLET | ORAL | 0 refills | Status: DC | PRN

## 2017-01-28 MED ORDER — ENOXAPARIN SODIUM 30 MG/0.3ML ~~LOC~~ SOLN: 30.0000 mg | Freq: Two times a day (BID) | SUBCUTANEOUS | 0 refills | Status: DC

## 2017-01-28 MED ORDER — DOCUSATE SODIUM 100 MG PO CAPS: 100.0000 mg | ORAL_CAPSULE | Freq: Two times a day (BID) | ORAL | 1 refills | Status: DC

## 2017-01-28 MED ORDER — WARFARIN SODIUM 5 MG PO TABS
5.0000 mg | ORAL_TABLET | Freq: Once | ORAL | Status: AC
  Administered 2017-01-28: 5 mg via ORAL
  Filled 2017-01-28: qty 1

## 2017-01-28 MED ORDER — ONDANSETRON HCL 4 MG PO TABS: 4.0000 mg | ORAL_TABLET | Freq: Four times a day (QID) | ORAL | 0 refills | Status: DC | PRN

## 2017-01-28 NOTE — Progress Notes (Signed)
Physical Therapy Treatment Patient Details Name: ARIELA MOCHIZUKI MRN: [id number] DOB: 03-21-1932 Today's Date: 01/28/2017    History of Present Illness Admitted for RTHA, Dir Ant, WBAT;  has a past medical history of Arthritis; Bradycardia (2014);  Diverticulosis; Dyspnea; Dysrhythmia; Heart murmur;  Hypertension;  Osteoporosis; PAF (paroxysmal atrial fibrillation) ([place]); and Pelvic fracture ([place]).     PT Comments    Ms. Lakatos continues to move very well, and is managing household distances well with properly sized RW; completed stair training with husband; on track for dc home tomorrow   Follow Up Recommendations  Home health PT     Equipment Recommendations  Rolling walker with 5" wheels (youth sized)    Recommendations for Other Services       Precautions / Restrictions Precautions Precautions: None Restrictions RLE Weight Bearing: Weight bearing as tolerated    Mobility  Bed Mobility Overal bed mobility: Needs Assistance Bed Mobility: Sit to Supine       Sit to supine: Supervision   General bed mobility comments: Cues for technique  Transfers Overall transfer level: Needs assistance Equipment used: Rolling walker (2 wheeled) Transfers: Sit to/from Stand Sit to Stand: Supervision         General transfer comment: pt with good recall of hand placement  Ambulation/Gait Ambulation/Gait assistance: Supervision Ambulation Distance (Feet): 240 Feet Assistive device: Rolling walker (2 wheeled) Gait Pattern/deviations: Step-through pattern Gait velocity: slowed   General Gait Details: Smoother pattern; sized RW for optimal height   Stairs Stairs: Yes   Stair Management: No rails;Backwards;With walker;Step to pattern Number of Stairs: 3 (x2) General stair comments: Husband present for stair traininga nd gave correct assist; cues for backwards technique and sequence; handout provided  Wheelchair Mobility    Modified Rankin (Stroke Patients Only)        Balance                                    Cognition Arousal/Alertness: Awake/alert Behavior During Therapy: WFL for tasks assessed/performed Overall Cognitive Status: Within Functional Limits for tasks assessed                      Exercises      General Comments        Pertinent Vitals/Pain Pain Assessment: 0-10 Pain Score: 1  Pain Location: R hip Pain Descriptors / Indicators: Aching;Sore Pain Intervention(s): Monitored during session    Home Living                      Prior Function            PT Goals (current goals can now be found in the care plan section) Acute Rehab PT Goals Patient Stated Goal: back to golf PT Goal Formulation: With patient Time For Goal Achievement: 02/04/17 Potential to Achieve Goals: Good Progress towards PT goals: Progressing toward goals    Frequency    7X/week      PT Plan Current plan remains appropriate    Co-evaluation             End of Session Equipment Utilized During Treatment: Gait belt Activity Tolerance: Patient tolerated treatment well Patient left: in chair;with call bell/phone within reach Nurse Communication: Mobility status PT Visit Diagnosis: Pain Pain - Right/Left: Right Pain - part of body: Hip     Time: 5784-6962 PT Time Calculation (min) (ACUTE ONLY): 36 min  Charges:  $Gait Training: 23-37 mins                    G Codes:       Colletta Maryland 02-23-17, 4:41 PM  Roney Marion, [place] Pager [phone] Office [phone]

## 2017-01-28 NOTE — Progress Notes (Signed)
   Subjective: Patient reports pain as mild to moderate.  Denies N/V/CP/SOB.  Objective:   VITALS:   Vitals:   01/27/17 1515 01/27/17 2146 01/28/17 0000 01/28/17 0620  BP: (!) 156/81 (!) 142/67 138/73 138/62  Pulse: 88 85 80 78  Resp:  19 19 19   Temp: 97.5 F (36.4 C) 97.5 F (36.4 C) 97.8 F (36.6 C) 97.5 F (36.4 C)  TempSrc: Oral Oral Oral Oral  SpO2: 99% 99% 99% 99%    NAD ABD soft Sensation intact distally Intact pulses distally Dorsiflexion/Plantar flexion intact Incision: dressing C/D/I Compartment soft   Lab Results  Component Value Date   WBC 9.2 01/28/2017   HGB 10.1 (L) 01/28/2017   HCT 30.9 (L) 01/28/2017   MCV 97.2 01/28/2017   PLT PENDING 01/28/2017   BMET    Component Value Date/Time   NA 140 01/28/2017 0547   NA 139 01/02/2017 1329   K 3.8 01/28/2017 0547   CL 110 01/28/2017 0547   CO2 24 01/28/2017 0547   GLUCOSE 146 (H) 01/28/2017 0547   BUN 17 01/28/2017 0547   BUN 17 01/02/2017 1329   CREATININE 0.69 01/28/2017 0547   CREATININE 0.89 05/31/2013 1148   CALCIUM 8.3 (L) 01/28/2017 0547   GFRNONAA >60 01/28/2017 0547   GFRNONAA 62 05/31/2013 1148   GFRAA >60 01/28/2017 0547   GFRAA 71 05/31/2013 1148    INR 1.3   Assessment/Plan: 1 Day Post-Op   Principal Problem:   Avascular necrosis of hip, right (HCC) Active Problems:   Avascular necrosis of bone of right hip (HCC)   WBAT with walker DVT ppx: coumadin with lovenox bridge, SCDs, TEDs PO pain control PT/OT AFIB: rate controlled, BP stable, monitor Dispo: recheck labs in am, plan for d/c home tomorrow   Elie Goody 01/28/2017, 7:54 AM   Rod Can, MD Cell [phone]

## 2017-01-28 NOTE — Care Management Note (Signed)
Case Management Note  Patient Details  Name: Susan Fitzgerald MRN: [id number] Date of Birth: 12-16-1931  Subjective/Objective:  83 yr old female s/p right total hip arthroplasty, anterior approach.                   Action/Plan: case manager spoke with this very pleasant lady concerning discharge plan and DME . Patient was preoperatively setup with [facility], no changes. Patient has expressed desire to go to [facility] "for a week or so". Case manager reinforced that based on her activity with therapy she does not need to go to rehab. Case Manager has ordered youth walker. Patient will have family and friends to assist at discharge.     Expected Discharge Date:  01/29/17               Expected Discharge Plan:  [place]  In-House Referral:  NA  Discharge planning Services  CM Consult  Post Acute Care Choice:  Durable Medical Equipment, Home Health Choice offered to:  Patient  DME Arranged:  Walker youth DME Agency:  [place]:  PT [place] Agency:  [facility] (formerly [facility])  Status of Service:  Completed, signed off  If discussed at [employer], dates discussed:    Additional Comments:  Ninfa Meeker, RN 01/28/2017, 2:55 PM

## 2017-01-28 NOTE — Discharge Instructions (Signed)
°Dr. Brian Swinteck °Joint Replacement Specialist °[facility] °[address] °[place],  [address] °[phone] ° ° °TOTAL HIP REPLACEMENT POSTOPERATIVE DIRECTIONS ° ° ° °Hip Rehabilitation, Guidelines Following Surgery  ° °WEIGHT BEARING °Weight bearing as tolerated with assist device (walker, cane, etc) as directed, use it as long as suggested by your surgeon or therapist, typically at least 4-6 weeks. ° °The results of a hip operation are greatly improved after range of motion and muscle strengthening exercises. Follow all safety measures which are given to protect your hip. If any of these exercises cause increased pain or swelling in your joint, decrease the amount until you are comfortable again. Then slowly increase the exercises. Call your caregiver if you have problems or questions.  ° °HOME CARE INSTRUCTIONS  °Most of the following instructions are designed to prevent the dislocation of your new hip.  °Remove items at home which could result in a fall. This includes throw rugs or furniture in walking pathways.  °Continue medications as instructed at time of discharge. °· You may have some home medications which will be placed on hold until you complete the course of blood thinner medication. °· You may start showering once you are discharged home. Do not remove your dressing. °Do not put on socks or shoes without following the instructions of your caregivers.   °Sit on chairs with arms. Use the chair arms to help push yourself up when arising.  °Arrange for the use of a toilet seat elevator so you are not sitting low.  °· Walk with walker as instructed.  °You may resume a sexual relationship in one month or when given the OK by your caregiver.  °Use walker as long as suggested by your caregivers.  °You may put full weight on your legs and walk as much as is comfortable. °Avoid periods of inactivity such as sitting longer than an hour when not asleep. This helps prevent  blood clots.  °You may return to work once you are cleared by your surgeon.  °Do not drive a car for 6 weeks or until released by your surgeon.  °Do not drive while taking narcotics.  °Wear elastic stockings for two weeks following surgery during the day but you may remove then at night.  °Make sure you keep all of your appointments after your operation with all of your doctors and caregivers. You should call the office at the above phone number and make an appointment for approximately two weeks after the date of your surgery. °Please pick up a stool softener and laxative for home use as long as you are requiring pain medications. °· ICE to the affected hip every three hours for 30 minutes at a time and then as needed for pain and swelling. Continue to use ice on the hip for pain and swelling from surgery. You may notice swelling that will progress down to the foot and ankle.  This is normal after surgery.  Elevate the leg when you are not up walking on it.   °It is important for you to complete the blood thinner medication as prescribed by your doctor. °· Continue to use the breathing machine which will help keep your temperature down.  It is common for your temperature to cycle up and down following surgery, especially at night when you are not up moving around and exerting yourself.  The breathing machine keeps your lungs expanded and your temperature down. ° °RANGE OF MOTION AND STRENGTHENING EXERCISES  °These exercises are   designed to help you keep full movement of your hip joint. Follow your caregiver's or physical therapist's instructions. Perform all exercises about fifteen times, three times per day or as directed. Exercise both hips, even if you have had only one joint replacement. These exercises can be done on a training (exercise) mat, on the floor, on a table or on a bed. Use whatever works the best and is most comfortable for you. Use music or television while you are exercising so that the exercises  are a pleasant break in your day. This will make your life better with the exercises acting as a break in routine you can look forward to.  Lying on your back, slowly slide your foot toward your buttocks, raising your knee up off the floor. Then slowly slide your foot back down until your leg is straight again.  Lying on your back spread your legs as far apart as you can without causing discomfort.  Lying on your side, raise your upper leg and foot straight up from the floor as far as is comfortable. Slowly lower the leg and repeat.  Lying on your back, tighten up the muscle in the front of your thigh (quadriceps muscles). You can do this by keeping your leg straight and trying to raise your heel off the floor. This helps strengthen the largest muscle supporting your knee.  Lying on your back, tighten up the muscles of your buttocks both with the legs straight and with the knee bent at a comfortable angle while keeping your heel on the floor.   SKILLED REHAB INSTRUCTIONS: If the patient is transferred to a skilled rehab facility following release from the hospital, a list of the current medications will be sent to the facility for the patient to continue.  When discharged from the skilled rehab facility, please have the facility set up the patient's [place] prior to being released. Also, the skilled facility will be responsible for providing the patient with their medications at time of release from the facility to include their pain medication and their blood thinner medication. If the patient is still at the rehab facility at time of the two week follow up appointment, the skilled rehab facility will also need to assist the patient in arranging follow up appointment in our office and any transportation needs.  MAKE SURE YOU:  Understand these instructions.  Will watch your condition.  Will get help right away if you are not doing well or get worse.  Pick up stool softner and  laxative for home use following surgery while on pain medications. Do not remove your dressing. The dressing is waterproof--it is OK to take showers. Continue to use ice for pain and swelling after surgery. Do not use any lotions or creams on the incision until instructed by your surgeon. Total Hip Protocol.     Information on my medicine - Coumadin   (Warfarin)  This medication education was reviewed with me or my healthcare representative as part of my discharge preparation.  The pharmacist that spoke with me during my hospital stay was:  Saundra Shelling, [facility]  Why was Coumadin prescribed for you? Coumadin was prescribed for you because you have a blood clot or a medical condition that can cause an increased risk of forming blood clots. Blood clots can cause serious health problems by blocking the flow of blood to the heart, lung, or brain. Coumadin can prevent harmful blood clots from forming. As a reminder your indication for  Coumadin is:   Stroke Prevention Because Of Atrial Fibrillation  What test will check on my response to Coumadin? While on Coumadin (warfarin) you will need to have an INR test regularly to ensure that your dose is keeping you in the desired range. The INR (international normalized ratio) number is calculated from the result of the laboratory test called prothrombin time (PT).  If an INR APPOINTMENT HAS NOT ALREADY BEEN MADE FOR YOU please schedule an appointment to have this lab work done by your health care provider within 7 days. Your INR goal is usually a number between:  2 to 3 or your provider may give you a more narrow range like 2-2.5.  Ask your health care provider during an office visit what your goal INR is.  What  do you need to  know  About  COUMADIN? Take Coumadin (warfarin) exactly as prescribed by your healthcare provider about the same time each day.  DO NOT stop taking without talking to the doctor who prescribed the medication.  Stopping without  other blood clot prevention medication to take the place of Coumadin may increase your risk of developing a new clot or stroke.  Get refills before you run out.  What do you do if you miss a dose? If you miss a dose, take it as soon as you remember on the same day then continue your regularly scheduled regimen the next day.  Do not take two doses of Coumadin at the same time.  Important Safety Information A possible side effect of Coumadin (Warfarin) is an increased risk of bleeding. You should call your healthcare provider right away if you experience any of the following: ? Bleeding from an injury or your nose that does not stop. ? Unusual colored urine (red or dark brown) or unusual colored stools (red or black). ? Unusual bruising for unknown reasons. ? A serious fall or if you hit your head (even if there is no bleeding).  Some foods or medicines interact with Coumadin (warfarin) and might alter your response to warfarin. To help avoid this: ? Eat a balanced diet, maintaining a consistent amount of Vitamin K. ? Notify your provider about major diet changes you plan to make. ? Avoid alcohol or limit your intake to 1 drink for women and 2 drinks for men per day. (1 drink is 5 oz. wine, 12 oz. beer, or 1.5 oz. liquor.)  Make sure that ANY health care provider who prescribes medication for you knows that you are taking Coumadin (warfarin).  Also make sure the healthcare provider who is monitoring your Coumadin knows when you have started a new medication including herbals and non-prescription products.  Coumadin (Warfarin)  Major Drug Interactions  Increased Warfarin Effect Decreased Warfarin Effect  Alcohol (large quantities) Antibiotics (esp. Septra/Bactrim, Flagyl, Cipro) Amiodarone (Cordarone) Aspirin (ASA) Cimetidine (Tagamet) Megestrol (Megace) NSAIDs (ibuprofen, naproxen, etc.) Piroxicam (Feldene) Propafenone (Rythmol SR) Propranolol (Inderal) Isoniazid (INH) Posaconazole  (Noxafil) Barbiturates (Phenobarbital) Carbamazepine (Tegretol) Chlordiazepoxide (Librium) Cholestyramine (Questran) Griseofulvin Oral Contraceptives Rifampin Sucralfate (Carafate) Vitamin K   Coumadin (Warfarin) Major Herbal Interactions  Increased Warfarin Effect Decreased Warfarin Effect  Garlic Ginseng Ginkgo biloba Coenzyme Q10 Green tea St. Johns wort    Coumadin (Warfarin) FOOD Interactions  Eat a consistent number of servings per week of foods HIGH in Vitamin K (1 serving =  cup)  Collards (cooked, or boiled & drained) Kale (cooked, or boiled & drained) Mustard greens (cooked, or boiled & drained) Parsley *serving size only =  cup Spinach (cooked, or boiled & drained) Swiss chard (cooked, or boiled & drained) Turnip greens (cooked, or boiled & drained)  Eat a consistent number of servings per week of foods MEDIUM-HIGH in Vitamin K (1 serving = 1 cup)  Asparagus (cooked, or boiled & drained) Broccoli (cooked, boiled & drained, or raw & chopped) Brussel sprouts (cooked, or boiled & drained) *serving size only =  cup Lettuce, raw (green leaf, endive, romaine) Spinach, raw Turnip greens, raw & chopped   These websites have more information on Coumadin (warfarin):  FailFactory.se; VeganReport.com.au;

## 2017-01-28 NOTE — Evaluation (Signed)
Physical Therapy Evaluation Patient Details Name: Susan Fitzgerald MRN: [id number] DOB: 06-04-1932 Today's Date: 01/28/2017   History of Present Illness  Admitted for RTHA, Dir Ant, WBAT;  has a past medical history of Arthritis; Bradycardia (2014);  Diverticulosis; Dyspnea; Dysrhythmia; Heart murmur;  Hypertension;  Osteoporosis; PAF (paroxysmal atrial fibrillation) ([place]); and Pelvic fracture ([place]).   Clinical Impression   Pt is s/p THA resulting in the deficits listed below (see PT Problem List). Susan Fitzgerald is overall moving well; She is nervous about managing at home with 9 yo husband; she would like to dc to SNF ([facility]); she is aware that Dr. Lyla Glassing would rather her go home; we discussed current functional status, risks associated with dc to SNF versus home; Pt will benefit from skilled PT to increase their independence and safety with mobility to allow discharge to the venue listed below.      Follow Up Recommendations Home health PT;Other (comment) (she would like to dc to SNF -- [facility])    Scientist, product/process development walker with 5" wheels (youth-sized RW)    Recommendations for Other Services OT consult     Precautions / Restrictions Precautions Precautions: None Precaution Comments: Monitor for activity tolerance; some DOE -- she is a good self-monitor Restrictions RLE Weight Bearing: Weight bearing as tolerated      Mobility  Bed Mobility Overal bed mobility: Needs Assistance Bed Mobility: Supine to Sit     Supine to sit: Supervision;HOB elevated (to match her home bed setup)     General bed mobility comments: Cues for technique; Able to sit up to long sit and then turn to EOB  Transfers Overall transfer level: Needs assistance   Transfers: Sit to/from Stand Sit to Stand: Supervision         General transfer comment: Cues for hand placement and technique  Ambulation/Gait Ambulation/Gait assistance: Supervision   Assistive device:  Rolling walker (2 wheeled) Gait Pattern/deviations: Step-through pattern Gait velocity: slowed   General Gait Details: Noting assymetric gait pattern, with heavier foot fall on L coming out of R stance; good self-monitor for activity tolerance; DOE 2/4 on room air; O2 sat 97% BP 109/57  Stairs            Wheelchair Mobility    Modified Rankin (Stroke Patients Only)       Balance                                             Pertinent Vitals/Pain Pain Assessment: 0-10 Pain Score: 1  Pain Location: R hip Pain Descriptors / Indicators: Aching;Sore Pain Intervention(s): Monitored during session;Repositioned    Home Living Family/patient expects to be discharged to:: Private residence Living Arrangements: Spouse/significant other Available Help at Discharge: Family;Available PRN/intermittently Type of Home: House Home Access: Stairs to enter Entrance Stairs-Rails: None Entrance Stairs-Number of Steps: 3 Home Layout: One level Home Equipment: Other (comment) (elevated commode (not sure if 3in1 or riser))      Prior Function Level of Independence: Independent with assistive device(s)         Comments: cane prn     Hand Dominance        Extremity/Trunk Assessment   Upper Extremity Assessment Upper Extremity Assessment: Defer to OT evaluation    Lower Extremity Assessment Lower Extremity Assessment: RLE deficits/detail RLE Deficits / Details: Grossly decr AROM and strength, with some soreness postop;  Able to actively abduct, adduct, and plex hip and knee without assistance    Cervical / Trunk Assessment Cervical / Trunk Assessment: Normal  Communication   Communication: No difficulties  Cognition Arousal/Alertness: Awake/alert Behavior During Therapy: WFL for tasks assessed/performed Overall Cognitive Status: Within Functional Limits for tasks assessed                      General Comments General comments (skin integrity,  edema, etc.): See doc flowsheets for vitals    Exercises Total Joint Exercises Ankle Circles/Pumps: AROM;Both;20 reps Quad Sets: AROM;Right;10 reps Gluteal Sets: AROM;Both;10 reps Towel Squeeze: AROM;Both;10 reps Heel Slides: AROM;Right;10 reps Hip ABduction/ADduction: AROM;Right;10 reps   Assessment/Plan    PT Assessment Patient needs continued PT services  PT Problem List Decreased strength;Decreased activity tolerance;Decreased balance;Decreased mobility;Decreased coordination;Decreased knowledge of use of DME;Decreased knowledge of precautions;Pain       PT Treatment Interventions DME instruction;Gait training;Stair training;Functional mobility training;Therapeutic activities;Therapeutic exercise;Patient/family education    PT Goals (Current goals can be found in the Care Plan section)  Acute Rehab PT Goals Patient Stated Goal: back to golf PT Goal Formulation: With patient Time For Goal Achievement: 02/04/17 Potential to Achieve Goals: Good    Frequency 7X/week   Barriers to discharge Other (comment) Pt is nervous about managing at home with 19 yo husband; she would like to dc to SNF ([facility]); she is aware that Dr. Lyla Glassing would rather her go home; we discussed current functional status, risks associated with dc to SNF versus home    Co-evaluation               End of Session Equipment Utilized During Treatment: Gait belt Activity Tolerance: Patient tolerated treatment well Patient left: in chair;with call bell/phone within reach Nurse Communication: Mobility status (IV infusion complete) PT Visit Diagnosis: Pain Pain - Right/Left: Right Pain - part of body: Hip         Time: 5053-9767 PT Time Calculation (min) (ACUTE ONLY): 38 min   Charges:   PT Evaluation $PT Eval Low Complexity: 1 Procedure PT Treatments $Gait Training: 8-22 mins $Therapeutic Exercise: 8-22 mins   PT G Codes:         Colletta Maryland 01/28/2017, 9:45 AM  Roney Marion, [place] Pager [phone] Office [phone]

## 2017-01-28 NOTE — Progress Notes (Signed)
ANTICOAGULATION CONSULT NOTE - FOLLOW UP  Pharmacy Consult for Coumadin Indication: atrial fibrillation and VTE prophylaxis post right THA  Allergies  Allergen Reactions  . Benazepril Hcl Cough    Patient Measurements: Height = 61 inches Weight = 61.5 kg    Vital Signs: Temp: 97.5 F (36.4 C) (03/13 0620) Temp Source: Oral (03/13 0620) BP: 138/62 (03/13 0620) Pulse Rate: 78 (03/13 0620)  Labs:  Recent Labs  01/27/17 0815 01/27/17 1634 01/28/17 0547  HGB  --  11.8* 10.1*  HCT  --  36.7 30.9*  PLT  --  137* 108*  APTT 28  --   --   LABPROT 14.8  --  16.2*  INR 1.16  --  1.30  CREATININE  --  0.71 0.69    Estimated Creatinine Clearance: 46.2 mL/min (by C-G formula based on SCr of 0.69 mg/dL).    Assessment: Susan Fitzgerald with history of Afib on Coumadin PTA.  Coumadin has been on hold for right THA on 01/27/17 and was resumed with the addition of Lovenox post-op.  INR is sub-therapeutic as expected.  Patient's hemoglobin and platelet count are decreasing; however, no bleeding is documented.   Goal of Therapy:  INR 2-3 Monitor platelets by anticoagulation protocol: Yes    Plan:  Repeat Coumadin 5mg  PO today Continue Lovenox 30mg  SQ Q12H until INR >/= 1.8 Daily PT / INR Watch CBC    Crystallynn Noorani D. Mina Marble, PharmD, BCPS Pager:  [phone] 01/28/2017, 8:41 AM

## 2017-01-28 NOTE — Evaluation (Signed)
Occupational Therapy Evaluation Patient Details Name: Susan Fitzgerald MRN: [id number] DOB: [birth year] Today's Date: 01/28/2017    History of Present Illness Admitted for RTHA, Dir Ant, WBAT;  has a past medical history of Arthritis; Bradycardia (2014);  Diverticulosis; Dyspnea; Dysrhythmia; Heart murmur;  Hypertension;  Osteoporosis; PAF (paroxysmal atrial fibrillation) ([place]); and Pelvic fracture ([place]).    Clinical Impression   Patient is s/p R THA surgery resulting in functional limitations due to the deficits listed below (see OT problem list). [facility] and recovery since spouse is 53 yo and only (A) upon d/c. Pt noted to be fatigued and winded after transfer.  Patient will benefit from skilled OT acutely to increase independence and safety with ADLS to allow discharge SNF. Pt likely to d/c home without OT follow up     Follow Up Recommendations  SNF (pt [facility]) pt likely to d/c home due to level of participation   Equipment Recommendations  None recommended by OT    Recommendations for Other Services       Precautions / Restrictions Precautions Precautions: None Precaution Comments: Monitor for activity tolerance; some DOE -- she is a good self-monitor Restrictions Weight Bearing Restrictions: Yes RLE Weight Bearing: Weight bearing as tolerated      Mobility Bed Mobility Overal bed mobility: Needs Assistance Bed Mobility: Supine to Sit     Supine to sit: Supervision;HOB elevated (to match her home bed setup)     General bed mobility comments: in chair on arrival  Transfers Overall transfer level: Needs assistance Equipment used: Rolling walker (2 wheeled) Transfers: Sit to/from Stand Sit to Stand: Supervision         General transfer comment: pt with good recall of hand placement    Balance                                            ADL Overall ADL's : Needs  assistance/impaired Eating/Feeding: Independent   Grooming: Wash/dry hands;Wash/dry face;Oral care;Supervision/safety   Upper Body Bathing: Independent   Lower Body Bathing: Supervison/ safety;Sit to/from stand Lower Body Bathing Details (indicate cue type and reason): pt able to reach bil LE at this time.  Upper Body Dressing : Independent       Toilet Transfer: Supervision/safety       Tub/ Shower Transfer: English as a second language teacher Details (indicate cue type and reason): educated on shower transfer with return demo. spouse present for education Functional mobility during ADLs: Supervision/safety General ADL Comments: pt educated on water temperature for first shower, lb dressing, wound care management and fall risk with removal of throw rugs in the home     Vision   Vision Assessment?: No apparent visual deficits     Perception     Praxis      Pertinent Vitals/Pain Pain Assessment: No/denies pain Pain Score: 1  Pain Location: R hip Pain Descriptors / Indicators: Aching;Sore Pain Intervention(s): Monitored during session;Repositioned     Hand Dominance Right   Extremity/Trunk Assessment Upper Extremity Assessment Upper Extremity Assessment: Overall WFL for tasks assessed   Lower Extremity Assessment Lower Extremity Assessment: Defer to PT evaluation RLE Deficits / Details: Grossly decr AROM and strength, with some soreness postop; Able to actively abduct, adduct, and plex hip and knee without assistance   Cervical / Trunk Assessment Cervical / Trunk Assessment:  Normal   Communication Communication Communication: No difficulties   Cognition Arousal/Alertness: Awake/alert Behavior During Therapy: WFL for tasks assessed/performed Overall Cognitive Status: Within Functional Limits for tasks assessed                     General Comments  pt notes fatigue with transfer and states "i feel a little winded"    Exercises Exercises: Total  Joint     Shoulder Instructions      Home Living Family/patient expects to be discharged to:: Private residence Living Arrangements: Spouse/significant other Available Help at Discharge: Family;Available PRN/intermittently Type of Home: House Home Access: Stairs to enter [employer] of Steps: 3 Entrance Stairs-Rails: None Home Layout: One level     Bathroom Shower/Tub: Occupational psychologist: Standard     Home Equipment: Toilet riser   Additional Comments: [facility] allow several days of recovery priro to d/c home      Prior Functioning/Environment Level of Independence: Independent with assistive device(s)        Comments: cane prn        OT Problem List: Decreased activity tolerance;Impaired balance (sitting and/or standing);Decreased knowledge of use of DME or AE;Decreased knowledge of precautions      OT Treatment/Interventions: Self-care/ADL training;Therapeutic exercise;DME and/or AE instruction;Therapeutic activities;Patient/family education;Balance training    OT Goals(Current goals can be found in the care plan section) Acute Rehab OT Goals Patient Stated Goal: back to golf OT Goal Formulation: With patient/family Time For Goal Achievement: 02/11/17 Potential to Achieve Goals: Good  OT Frequency: Min 2X/week   Barriers to D/C:            Co-evaluation              End of Session Equipment Utilized During Treatment: Gait belt;Rolling walker Nurse Communication: Mobility status  Activity Tolerance: Patient tolerated treatment well Patient left: in chair;with call bell/phone within reach;with family/visitor present  OT Visit Diagnosis: Unsteadiness on feet (R26.81)                ADL either performed or assessed with clinical judgement  Time: 3716-9678 OT Time Calculation (min): 17 min Charges:  OT General Charges $OT Visit: 1 Procedure OT Evaluation $OT Eval Moderate Complexity:  1 Procedure G-Codes:      Jeri Modena   OTR/L Pager: [phone] Office: [phone] .   Parke Poisson B 01/28/2017, 10:48 AM

## 2017-01-29 LAB — CBC
HEMATOCRIT: 28.6 % — AB (ref 36.0–46.0)
HEMOGLOBIN: 9.4 g/dL — AB (ref 12.0–15.0)
MCH: 32.2 pg (ref 26.0–34.0)
MCHC: 32.9 g/dL (ref 30.0–36.0)
MCV: 97.9 fL (ref 78.0–100.0)
Platelets: 106 10*3/uL — ABNORMAL LOW (ref 150–400)
RBC: 2.92 MIL/uL — ABNORMAL LOW (ref 3.87–5.11)
RDW: 16.3 % — ABNORMAL HIGH (ref 11.5–15.5)
WBC: 11.8 10*3/uL — ABNORMAL HIGH (ref 4.0–10.5)

## 2017-01-29 LAB — PROTIME-INR
INR: 1.46
Prothrombin Time: 17.9 seconds — ABNORMAL HIGH (ref 11.4–15.2)

## 2017-01-29 MED ORDER — WARFARIN SODIUM 7.5 MG PO TABS: 7.5000 mg | ORAL_TABLET | Freq: Once | ORAL | Status: DC

## 2017-01-29 NOTE — Progress Notes (Signed)
Physical Therapy Treatment Patient Details Name: Susan Fitzgerald MRN: [id number] DOB: 07-23-1932 Today's Date: 01/29/2017    History of Present Illness Admitted for RTHA, Dir Ant, WBAT;  has a past medical history of Arthritis; Bradycardia (2014);  Diverticulosis; Dyspnea; Dysrhythmia; Heart murmur;  Hypertension;  Osteoporosis; PAF (paroxysmal atrial fibrillation) ([place]); and Pelvic fracture ([place]).     PT Comments    Overall excellent progress with mobility and progressive ambulation; less fatigue today with increased distance; OK for dc home from PT standpoint; If she is still here for pm session, will focus on therex   Follow Up Recommendations  Home health PT     Equipment Recommendations  Other (comment) (RW delivered to room)    Recommendations for Other Services       Precautions / Restrictions Precautions Precautions: None Restrictions RLE Weight Bearing: Weight bearing as tolerated    Mobility  Bed Mobility Overal bed mobility: Needs Assistance Bed Mobility: Supine to Sit     Supine to sit: Supervision;HOB elevated (to match her home bed setup)     General bed mobility comments: No difficulty  Transfers Overall transfer level: Needs assistance Equipment used: Rolling walker (2 wheeled) Transfers: Sit to/from Stand Sit to Stand: Supervision         General transfer comment: pt with good recall of hand placement  Ambulation/Gait Ambulation/Gait assistance: Supervision;Modified independent (Device/Increase time) Ambulation Distance (Feet): 250 Feet Assistive device: Rolling walker (2 wheeled) Gait Pattern/deviations: Step-through pattern     General Gait Details: Smoother pattern; sized RW for optimal height   Stairs Stairs: Yes   Stair Management: No rails;Backwards;With walker;Step to pattern Number of Stairs: 2 General stair comments: Good technique; min cues for sequence  Wheelchair Mobility    Modified Rankin (Stroke Patients Only)        Balance                                    Cognition Arousal/Alertness: Awake/alert Behavior During Therapy: WFL for tasks assessed/performed Overall Cognitive Status: Within Functional Limits for tasks assessed                      Exercises      General Comments        Pertinent Vitals/Pain Pain Assessment: 0-10 Pain Score: 3  Pain Location: R hip Pain Descriptors / Indicators: Aching;Sore Pain Intervention(s): Monitored during session    Home Living                      Prior Function            PT Goals (current goals can now be found in the care plan section) Acute Rehab PT Goals Patient Stated Goal: back to golf PT Goal Formulation: With patient Time For Goal Achievement: 02/04/17 Potential to Achieve Goals: Good Progress towards PT goals: Progressing toward goals    Frequency    7X/week      PT Plan Current plan remains appropriate    Co-evaluation             End of Session Equipment Utilized During Treatment: Gait belt Activity Tolerance: Patient tolerated treatment well Patient left: in chair;with call bell/phone within reach Nurse Communication: Mobility status PT Visit Diagnosis: Pain Pain - Right/Left: Right Pain - part of body: Hip     Time: 1245-8099 PT Time Calculation (min) (ACUTE ONLY): 21 min  Charges:  $Gait Training: 8-22 mins                    G Codes:       Colletta Maryland 01/31/17, 11:44 AM  Roney Marion, PT  Acute Rehabilitation Services Pager [phone] Office [phone]

## 2017-01-29 NOTE — Progress Notes (Signed)
ANTICOAGULATION CONSULT NOTE - FOLLOW UP  Pharmacy Consult for Coumadin Indication: atrial fibrillation and VTE prophylaxis post right THA  Allergies  Allergen Reactions  . Benazepril Hcl Cough    Patient Measurements: Height = 61 inches Weight = 61.5 kg    Vital Signs: Temp: 97.6 F (36.4 C) (03/13 2126) Temp Source: Oral (03/13 2126) BP: 117/64 (03/13 2126) Pulse Rate: 75 (03/13 2146)  Labs:  Recent Labs  01/27/17 0815  01/27/17 1634 01/28/17 0547 01/29/17 0703  HGB  --   < > 11.8* 10.1* 9.4*  HCT  --   --  36.7 30.9* 28.6*  PLT  --   --  137* 108* 106*  APTT 28  --   --   --   --   LABPROT 14.8  --   --  16.2* 17.9*  INR 1.16  --   --  1.30 1.46  CREATININE  --   --  0.71 0.69  --   < > = values in this interval not displayed.  Estimated Creatinine Clearance: 46.2 mL/min (by C-G formula based on SCr of 0.69 mg/dL).    Assessment: 74 YOF with history of Afib on Coumadin PTA.  Coumadin has been on hold for right THA on 01/27/17 and was resumed with the addition of Lovenox post-op.  INR is sub-therapeutic as expected.  Patient's hemoglobin and platelet count are decreasing; however, no bleeding is documented.   Goal of Therapy:  INR 2-3 Monitor platelets by anticoagulation protocol: Yes    Plan:  Coumadin 7.5mg  PO today Continue Lovenox 30mg  SQ Q12H until INR >/= 1.8 Daily PT / INR Watch CBC   Marielouise Amey D. Mina Marble, PharmD, BCPS Pager:  [phone] 01/29/2017, 8:33 AM

## 2017-01-29 NOTE — Progress Notes (Signed)
Reviewed discharge instructions/medication with patient and patient's husband.  Answered their questions.  Patient is ready for discharge.

## 2017-01-29 NOTE — Progress Notes (Signed)
   Subjective: Patient reports pain as mild to moderate.  Denies N/V/CP/SOB.  Objective:   VITALS:   Vitals:   01/28/17 0620 01/28/17 0925 01/28/17 2126 01/28/17 2146  BP: 138/62 (!) 109/57 117/64   Pulse: 78 68 65 75  Resp: 19  19   Temp: 97.5 F (36.4 C)  97.6 F (36.4 C)   TempSrc: Oral  Oral   SpO2: 99% 97% 97%     NAD ABD soft Sensation intact distally Intact pulses distally Dorsiflexion/Plantar flexion intact Incision: dressing C/D/I Compartment soft   Lab Results  Component Value Date   WBC 11.8 (H) 01/29/2017   HGB 9.4 (L) 01/29/2017   HCT 28.6 (L) 01/29/2017   MCV 97.9 01/29/2017   PLT 106 (L) 01/29/2017   BMET    Component Value Date/Time   NA 140 01/28/2017 0547   NA 139 01/02/2017 1329   K 3.8 01/28/2017 0547   CL 110 01/28/2017 0547   CO2 24 01/28/2017 0547   GLUCOSE 146 (H) 01/28/2017 0547   BUN 17 01/28/2017 0547   BUN 17 01/02/2017 1329   CREATININE 0.69 01/28/2017 0547   CREATININE 0.89 05/31/2013 1148   CALCIUM 8.3 (L) 01/28/2017 0547   GFRNONAA >60 01/28/2017 0547   GFRNONAA 62 05/31/2013 1148   GFRAA >60 01/28/2017 0547   GFRAA 71 05/31/2013 1148    INR 1.46   Assessment/Plan: 2 Days Post-Op   Principal Problem:   Avascular necrosis of hip, right (HCC) Active Problems:   Avascular necrosis of bone of right hip (HCC)   WBAT with walker DVT ppx: coumadin with lovenox bridge, SCDs, TEDs PO pain control PT/OT AFIB: rate controlled, BP stable, monitor Dispo:  d/c home today   Elie Goody 01/29/2017, 8:07 AM   Rod Can, MD Cell [phone]

## 2017-01-30 DIAGNOSIS — Z79891 Long term (current) use of opiate analgesic: Secondary | ICD-10-CM | POA: Diagnosis not present

## 2017-01-30 DIAGNOSIS — M069 Rheumatoid arthritis, unspecified: Secondary | ICD-10-CM | POA: Diagnosis not present

## 2017-01-30 DIAGNOSIS — Z7901 Long term (current) use of anticoagulants: Secondary | ICD-10-CM | POA: Diagnosis not present

## 2017-01-30 DIAGNOSIS — I482 Chronic atrial fibrillation: Secondary | ICD-10-CM | POA: Diagnosis not present

## 2017-01-30 DIAGNOSIS — Z471 Aftercare following joint replacement surgery: Secondary | ICD-10-CM | POA: Diagnosis not present

## 2017-01-30 DIAGNOSIS — M810 Age-related osteoporosis without current pathological fracture: Secondary | ICD-10-CM | POA: Diagnosis not present

## 2017-01-30 DIAGNOSIS — I700 Atherosclerosis of aorta: Secondary | ICD-10-CM | POA: Diagnosis not present

## 2017-01-30 DIAGNOSIS — Z96641 Presence of right artificial hip joint: Secondary | ICD-10-CM | POA: Diagnosis not present

## 2017-01-31 ENCOUNTER — Ambulatory Visit (INDEPENDENT_AMBULATORY_CARE_PROVIDER_SITE_OTHER): Payer: Medicare Other | Admitting: Pharmacist

## 2017-01-31 DIAGNOSIS — I482 Chronic atrial fibrillation, unspecified: Secondary | ICD-10-CM

## 2017-01-31 DIAGNOSIS — Z7901 Long term (current) use of anticoagulants: Secondary | ICD-10-CM

## 2017-01-31 LAB — COAGUCHEK XS/INR WAIVED
INR: 2.4 — AB (ref 0.9–1.1)
PROTHROMBIN TIME: 28.4 s

## 2017-01-31 NOTE — Patient Instructions (Signed)
Anticoagulation Dose Instructions as of 01/31/2017      Dorene Grebe Tue Wed Thu Fri Sat   New Dose 5 mg 2.5 mg 5 mg 5 mg 5 mg 2.5 mg 5 mg    Description   Continue current dose of warfarin 5mg  - take 1/2 tablet on mondays and fridays.  Take 1 tablet all other days.   INR was 2.4 today (goal: 2-3)

## 2017-02-03 ENCOUNTER — Ambulatory Visit (INDEPENDENT_AMBULATORY_CARE_PROVIDER_SITE_OTHER): Payer: Medicare Other | Admitting: Pharmacist

## 2017-02-03 ENCOUNTER — Encounter: Payer: Medicare Other | Admitting: Pharmacist

## 2017-02-03 ENCOUNTER — Encounter: Payer: Self-pay | Admitting: Pharmacist

## 2017-02-03 DIAGNOSIS — Z79891 Long term (current) use of opiate analgesic: Secondary | ICD-10-CM | POA: Diagnosis not present

## 2017-02-03 DIAGNOSIS — I482 Chronic atrial fibrillation, unspecified: Secondary | ICD-10-CM

## 2017-02-03 DIAGNOSIS — M810 Age-related osteoporosis without current pathological fracture: Secondary | ICD-10-CM | POA: Diagnosis not present

## 2017-02-03 DIAGNOSIS — Z7901 Long term (current) use of anticoagulants: Secondary | ICD-10-CM

## 2017-02-03 DIAGNOSIS — Z471 Aftercare following joint replacement surgery: Secondary | ICD-10-CM | POA: Diagnosis not present

## 2017-02-03 DIAGNOSIS — Z96641 Presence of right artificial hip joint: Secondary | ICD-10-CM | POA: Diagnosis not present

## 2017-02-03 DIAGNOSIS — M069 Rheumatoid arthritis, unspecified: Secondary | ICD-10-CM | POA: Diagnosis not present

## 2017-02-03 DIAGNOSIS — I700 Atherosclerosis of aorta: Secondary | ICD-10-CM | POA: Diagnosis not present

## 2017-02-03 LAB — COAGUCHEK XS/INR WAIVED
INR: 3.4 — ABNORMAL HIGH (ref 0.9–1.1)
PROTHROMBIN TIME: 40.5 s

## 2017-02-04 ENCOUNTER — Telehealth: Payer: Self-pay | Admitting: Family Medicine

## 2017-02-04 DIAGNOSIS — Z7901 Long term (current) use of anticoagulants: Secondary | ICD-10-CM | POA: Diagnosis not present

## 2017-02-04 DIAGNOSIS — Z471 Aftercare following joint replacement surgery: Secondary | ICD-10-CM | POA: Diagnosis not present

## 2017-02-04 DIAGNOSIS — M810 Age-related osteoporosis without current pathological fracture: Secondary | ICD-10-CM | POA: Diagnosis not present

## 2017-02-04 DIAGNOSIS — I482 Chronic atrial fibrillation: Secondary | ICD-10-CM | POA: Diagnosis not present

## 2017-02-04 DIAGNOSIS — I700 Atherosclerosis of aorta: Secondary | ICD-10-CM | POA: Diagnosis not present

## 2017-02-04 DIAGNOSIS — Z96641 Presence of right artificial hip joint: Secondary | ICD-10-CM | POA: Diagnosis not present

## 2017-02-04 DIAGNOSIS — Z79891 Long term (current) use of opiate analgesic: Secondary | ICD-10-CM | POA: Diagnosis not present

## 2017-02-04 DIAGNOSIS — M069 Rheumatoid arthritis, unspecified: Secondary | ICD-10-CM | POA: Diagnosis not present

## 2017-02-04 NOTE — Telephone Encounter (Signed)
Tammy, you do not have any openings on 3/26, wasn't sure if you wanted to double book for her sometime that day.

## 2017-02-04 NOTE — Telephone Encounter (Signed)
No double booking needed. She can some 02/12/17 when she is scheduled.

## 2017-02-04 NOTE — Telephone Encounter (Signed)
No openings - ok to keep appt for 02/12/17

## 2017-02-04 NOTE — Telephone Encounter (Signed)
Patient aware to keep schedule appointment on 02/12/17.

## 2017-02-05 ENCOUNTER — Other Ambulatory Visit: Payer: Self-pay | Admitting: Family Medicine

## 2017-02-06 DIAGNOSIS — Z79891 Long term (current) use of opiate analgesic: Secondary | ICD-10-CM | POA: Diagnosis not present

## 2017-02-06 DIAGNOSIS — Z7901 Long term (current) use of anticoagulants: Secondary | ICD-10-CM | POA: Diagnosis not present

## 2017-02-06 DIAGNOSIS — Z96641 Presence of right artificial hip joint: Secondary | ICD-10-CM | POA: Diagnosis not present

## 2017-02-06 DIAGNOSIS — I700 Atherosclerosis of aorta: Secondary | ICD-10-CM | POA: Diagnosis not present

## 2017-02-06 DIAGNOSIS — I482 Chronic atrial fibrillation: Secondary | ICD-10-CM | POA: Diagnosis not present

## 2017-02-06 DIAGNOSIS — Z471 Aftercare following joint replacement surgery: Secondary | ICD-10-CM | POA: Diagnosis not present

## 2017-02-06 DIAGNOSIS — M810 Age-related osteoporosis without current pathological fracture: Secondary | ICD-10-CM | POA: Diagnosis not present

## 2017-02-06 DIAGNOSIS — M069 Rheumatoid arthritis, unspecified: Secondary | ICD-10-CM | POA: Diagnosis not present

## 2017-02-09 ENCOUNTER — Emergency Department (HOSPITAL_COMMUNITY): Payer: Medicare Other

## 2017-02-09 ENCOUNTER — Observation Stay (HOSPITAL_COMMUNITY)
Admission: EM | Admit: 2017-02-09 | Discharge: 2017-02-10 | Disposition: A | Discharge status: Home / Self Care | Payer: Medicare Other | Attending: Cardiovascular Disease | Admitting: Cardiovascular Disease

## 2017-02-09 ENCOUNTER — Encounter (HOSPITAL_COMMUNITY): Payer: Self-pay

## 2017-02-09 DIAGNOSIS — M549 Dorsalgia, unspecified: Secondary | ICD-10-CM | POA: Diagnosis not present

## 2017-02-09 DIAGNOSIS — Z7901 Long term (current) use of anticoagulants: Secondary | ICD-10-CM | POA: Insufficient documentation

## 2017-02-09 DIAGNOSIS — I4891 Unspecified atrial fibrillation: Secondary | ICD-10-CM | POA: Diagnosis present

## 2017-02-09 DIAGNOSIS — R079 Chest pain, unspecified: Secondary | ICD-10-CM | POA: Diagnosis not present

## 2017-02-09 DIAGNOSIS — Z79899 Other long term (current) drug therapy: Secondary | ICD-10-CM | POA: Diagnosis not present

## 2017-02-09 DIAGNOSIS — I10 Essential (primary) hypertension: Secondary | ICD-10-CM | POA: Diagnosis not present

## 2017-02-09 DIAGNOSIS — I480 Paroxysmal atrial fibrillation: Principal | ICD-10-CM | POA: Insufficient documentation

## 2017-02-09 DIAGNOSIS — R000 Tachycardia, unspecified: Secondary | ICD-10-CM | POA: Diagnosis not present

## 2017-02-09 DIAGNOSIS — M810 Age-related osteoporosis without current pathological fracture: Secondary | ICD-10-CM | POA: Insufficient documentation

## 2017-02-09 DIAGNOSIS — R0602 Shortness of breath: Secondary | ICD-10-CM | POA: Diagnosis not present

## 2017-02-09 DIAGNOSIS — Z96641 Presence of right artificial hip joint: Secondary | ICD-10-CM | POA: Diagnosis not present

## 2017-02-09 DIAGNOSIS — I4819 Other persistent atrial fibrillation: Secondary | ICD-10-CM | POA: Diagnosis present

## 2017-02-09 DIAGNOSIS — M87051 Idiopathic aseptic necrosis of right femur: Secondary | ICD-10-CM | POA: Diagnosis present

## 2017-02-09 LAB — BASIC METABOLIC PANEL
Anion gap: 10 (ref 5–15)
BUN: 16 mg/dL (ref 6–20)
CALCIUM: 9 mg/dL (ref 8.9–10.3)
CO2: 27 mmol/L (ref 22–32)
Chloride: 102 mmol/L (ref 101–111)
Creatinine, Ser: 1.08 mg/dL — ABNORMAL HIGH (ref 0.44–1.00)
GFR, EST AFRICAN AMERICAN: 54 mL/min — AB (ref >60)
GFR, EST NON AFRICAN AMERICAN: 46 mL/min — AB (ref >60)
Glucose, Bld: 150 mg/dL — ABNORMAL HIGH (ref 65–99)
Potassium: 3.8 mmol/L (ref 3.5–5.1)
SODIUM: 139 mmol/L (ref 135–145)

## 2017-02-09 LAB — PROTIME-INR
INR: 3.13
PROTHROMBIN TIME: 32.9 s — AB (ref 11.4–15.2)

## 2017-02-09 LAB — CBC
HCT: 34.1 % — ABNORMAL LOW (ref 36.0–46.0)
HEMOGLOBIN: 10.5 g/dL — AB (ref 12.0–15.0)
MCH: 30.8 pg (ref 26.0–34.0)
MCHC: 30.8 g/dL (ref 30.0–36.0)
MCV: 100 fL (ref 78.0–100.0)
PLATELETS: 269 10*3/uL (ref 150–400)
RBC: 3.41 MIL/uL — AB (ref 3.87–5.11)
RDW: 17 % — ABNORMAL HIGH (ref 11.5–15.5)
WBC: 6.7 10*3/uL (ref 4.0–10.5)

## 2017-02-09 LAB — I-STAT TROPONIN, ED: Troponin i, poc: 0.01 ng/mL (ref 0.00–0.08)

## 2017-02-09 MED ORDER — IOPAMIDOL (ISOVUE-370) INJECTION 76%
INTRAVENOUS | Status: AC
  Administered 2017-02-09: 100 mL
  Filled 2017-02-09: qty 100

## 2017-02-09 MED ORDER — DILTIAZEM HCL-DEXTROSE 100-5 MG/100ML-% IV SOLN (PREMIX)
5.0000 mg/h | Freq: Once | INTRAVENOUS | Status: AC
  Administered 2017-02-09: 5 mg/h via INTRAVENOUS
  Filled 2017-02-09: qty 100

## 2017-02-09 MED ORDER — LORAZEPAM 2 MG/ML IJ SOLN
0.5000 mg | Freq: Once | INTRAMUSCULAR | Status: AC
  Administered 2017-02-09: 0.5 mg via INTRAVENOUS
  Filled 2017-02-09: qty 1

## 2017-02-09 MED ORDER — DILTIAZEM HCL 25 MG/5ML IV SOLN
20.0000 mg | Freq: Once | INTRAVENOUS | Status: AC
  Administered 2017-02-09: 20 mg via INTRAVENOUS
  Filled 2017-02-09: qty 5

## 2017-02-09 NOTE — ED Triage Notes (Signed)
Pt complaining of SOB and mid lower chest pain. Pt denies any cough. Pt states increased SOB with exertion. Pt states R hip replacement 2 week ago.

## 2017-02-09 NOTE — ED Notes (Signed)
Pt transported to CT on monitor with RN

## 2017-02-09 NOTE — ED Notes (Signed)
Pt tachy at triage, HR = 154.

## 2017-02-09 NOTE — ED Provider Notes (Signed)
[facility] DEPT Provider Note   CSN: [id number] Arrival date & time: 02/09/17  2102    Level V caveat unstable vital signs History   Chief Complaint Chief Complaint  Patient presents with  . Shortness of Breath  . Chest Pain  . Tachycardia    HPI Susan Fitzgerald is a 83 y.o. female.Plains of shortness of breath onset this morning waxes and wanes. Worse with exertion and improved with rest. She feels that that she is in atrial fibrillation with rapid ventricular response. No treatment prior to coming here. Patient has history of atrial fibrillation not amenable to cardioversion she reports. Her doctors to try to rate control her. No other associated symptoms. Denies chest pain denies lightheadedness. She is status post hip replacement 01/27/2017.  HPI  Past Medical History:  Diagnosis Date  . Arthritis   . Bradycardia 2014  . Cataract   . Diverticulosis   . Dyspnea    periodically  . Dysrhythmia    a - fib  . Heart murmur    ??? bruit  . History of stress test    a. 04/09/13-nuclear stress-no ischemia low risk  . Hypertension   . Menopause   . Osteoporosis   . PAF (paroxysmal atrial fibrillation) ([place])    a. s/p prior DCCV; b. On flecainide & coumadin (CHA2DS2VASc = 4);  c. 03/2016 Echo: EF 55-60%, mod LVH, mod AI, mild to mod TR, PASP 72mmHg.  [facility])     Patient Active Problem List   Diagnosis Date Noted  . Avascular necrosis of hip, right ([place]) 01/27/2017  . Avascular necrosis of bone of right hip ([place]) 01/27/2017  . Abdominal aortic atherosclerosis ([place]) 09/24/2016  . Rheumatoid arthritis ([place]) 05/07/2016  . Symptomatic bradycardia 06/02/2013  . Hypertension 04/21/2013  . Osteoporosis 04/21/2013  . Atrial fibrillation, chronic ([place]) 02/09/2013    Past Surgical History:  Procedure Laterality Date  . BIOPSY BREAST    . BIOPSY BREAST     right & benign  . BREAST SURGERY    . CARDIOVERSION N/A 10/30/2016   Procedure: CARDIOVERSION;   Surgeon: Larey Dresser, MD;  Location: [facility];  Service: Cardiovascular;  Laterality: N/A;  . CATARACT EXTRACTION    . EYE SURGERY     cataracts  . TEE WITHOUT CARDIOVERSION N/A 10/30/2016   Procedure: TRANSESOPHAGEAL ECHOCARDIOGRAM (TEE);  Surgeon: Larey Dresser, MD;  Location: [facility];  Service: Cardiovascular;  Laterality: N/A;  . TOTAL HIP ARTHROPLASTY Right 01/27/2017   Procedure: TOTAL HIP ARTHROPLASTY ANTERIOR APPROACH;  Surgeon: Rod Can, MD;  Location: [facility];  Service: Orthopedics;  Laterality: Right;    OB History    Gravida Para Term Preterm AB Living   2 2 2     2    SAB TAB Ectopic Multiple Live Births                   Home Medications    Prior to Admission medications   Medication Sig Start Date End Date Taking? Authorizing Provider  calcium-vitamin D (OSCAL WITH D) 250-125 MG-UNIT per tablet Take 1 tablet by mouth 2 (two) times daily.    Historical Provider, MD  diltiazem (CARDIZEM CD) 180 MG 24 hr capsule Take 1 capsule (180 mg total) by mouth daily. 01/09/17   Minus Breeding, MD  diltiazem (CARDIZEM) 30 MG tablet Take 1 tablet every 4 hours AS NEEDED for heart rate >100 as long as blood pressure >100. 12/11/16   Sherran Needs, NP  docusate sodium (COLACE) 100 MG capsule Take 1 capsule (100 mg total) by mouth 2 (two) times daily. 01/28/17   Rod Can, MD  fluticasone (FLONASE) 50 MCG/ACT nasal spray Place 2 sprays into both nostrils daily as needed. 01/16/17   Cherre Robins, PharmD  folic acid (FOLVITE) 1 MG tablet Take 1 mg by mouth daily.    Historical Provider, MD  furosemide (LASIX) 20 MG tablet Take 1 to 2 tablets by  mouth daily as needed Patient taking differently: takes 1 tablet daily 02/07/16   Chipper Herb, MD  gabapentin (NEURONTIN) 100 MG capsule Take 1 capsule (100 mg total) by mouth at bedtime. 12/16/16   Chipper Herb, MD  HYDROcodone-acetaminophen (NORCO/VICODIN) 5-325 MG tablet Take 1-2 tablets by mouth every 4 (four) hours as  needed (breakthrough pain). 01/28/17   Rod Can, MD  lisinopril (PRINIVIL,ZESTRIL) 40 MG tablet TAKE 1 TABLET BY MOUTH  DAILY 02/05/17   Chipper Herb, MD  Multiple Vitamin (MULTIVITAMIN) capsule Take 1 capsule by mouth daily.    Historical Provider, MD  ondansetron (ZOFRAN) 4 MG tablet Take 1 tablet (4 mg total) by mouth every 6 (six) hours as needed for nausea. 01/28/17   Rod Can, MD  pindolol (VISKEN) 5 MG tablet Take 10-15 mg by mouth See admin instructions. 10 mg in the morning and 15 mg in the evening    Historical Provider, MD  potassium chloride SA (K-DUR,KLOR-CON) 20 MEQ tablet TAKE 1 TABLET BY MOUTH  DAILY 02/05/17   Chipper Herb, MD  senna (SENOKOT) 8.6 MG TABS tablet Take 2 tablets (17.2 mg total) by mouth at bedtime. 01/28/17   Rod Can, MD  warfarin (COUMADIN) 5 MG tablet Take 0.5-1 tablets (2.5-5 mg total) by mouth daily. Takes 2.5 mg on Mondays and Fridays and 5 mg all other days of the week 01/16/17   Cherre Robins, PharmD    Family History Family History  Problem Relation Age of Onset  . Stroke Mother     cerebral hemorrhage  . Heart disease Father     MI  . Heart attack Father   . Vision loss Father   . Heart disease Brother   . Heart attack Brother   . Cancer Maternal Aunt     breast  . Cancer Brother   . Heart disease Brother   . Cancer Daughter     breast    Social History Social History  Substance Use Topics  . Smoking status: Never Smoker  . Smokeless tobacco: Never Used  . Alcohol use No     Allergies   Benazepril hcl   Review of Systems Review of Systems  Constitutional: Negative.   HENT: Negative.   Respiratory: Positive for shortness of breath.   Cardiovascular: Negative.   Gastrointestinal: Negative.   Musculoskeletal: Negative.   Skin: Negative.   Neurological: Negative.   Psychiatric/Behavioral: Negative.   All other systems reviewed and are negative.    Physical Exam Updated Vital Signs BP (!) 155/139   Pulse  (!) 148   Temp 99.5 F (37.5 C)   Resp 16   SpO2 98%   Physical Exam  Constitutional: She appears well-developed and well-nourished. No distress.  HENT:  Head: Normocephalic and atraumatic.  Eyes: Conjunctivae are normal. Pupils are equal, round, and reactive to light.  Neck: Neck supple. No tracheal deviation present. No thyromegaly present.  Cardiovascular:  No murmur heard. TachyCardiac irregularly irregular  Pulmonary/Chest: Effort normal and breath sounds normal.  Abdominal: Soft. Bowel  sounds are normal. She exhibits no distension. There is no tenderness.  Musculoskeletal: Normal range of motion. She exhibits no edema or tenderness.  Neurological: She is alert. Coordination normal.  Skin: Skin is warm and dry. No rash noted.  Psychiatric: She has a normal mood and affect.  Nursing note and vitals reviewed.    ED Treatments / Results  Labs (all labs ordered are listed, but only abnormal results are displayed) Labs Reviewed  BASIC METABOLIC PANEL  CBC  I-STAT [place], ED  Chest x-ray viewed by me Results for orders placed or performed during the hospital encounter of 16/10/96  Basic metabolic panel  Result Value Ref Range   Sodium 139 135 - 145 mmol/L   Potassium 3.8 3.5 - 5.1 mmol/L   Chloride 102 101 - 111 mmol/L   CO2 27 22 - 32 mmol/L   Glucose, Bld 150 (H) 65 - 99 mg/dL   BUN 16 6 - 20 mg/dL   Creatinine, Ser 1.08 (H) 0.44 - 1.00 mg/dL   Calcium 9.0 8.9 - 10.3 mg/dL   GFR calc non Af Amer 46 (L) >60 mL/min   GFR calc Af Amer 54 (L) >60 mL/min   Anion gap 10 5 - 15  CBC  Result Value Ref Range   WBC 6.7 4.0 - 10.5 K/uL   RBC 3.41 (L) 3.87 - 5.11 MIL/uL   Hemoglobin 10.5 (L) 12.0 - 15.0 g/dL   HCT 34.1 (L) 36.0 - 46.0 %   MCV 100.0 78.0 - 100.0 fL   MCH 30.8 26.0 - 34.0 pg   MCHC 30.8 30.0 - 36.0 g/dL   RDW 17.0 (H) 11.5 - 15.5 %   Platelets 269 150 - 400 K/uL  Protime-INR  Result Value Ref Range   Prothrombin Time 32.9 (H) 11.4 - 15.2 seconds    INR 3.13   I-stat troponin, ED  Result Value Ref Range   Troponin i, poc 0.01 0.00 - 0.08 ng/mL   Comment 3           Ct Angio Chest Pe W Or Wo Contrast  Result Date: 02/09/2017 CLINICAL DATA:  Acute onset of worsening shortness of breath. Recent hip surgery 2 weeks ago. Initial encounter. EXAM: CT ANGIOGRAPHY CHEST WITH CONTRAST TECHNIQUE: Multidetector CT imaging of the chest was performed using the standard protocol during bolus administration of intravenous contrast. Multiplanar CT image reconstructions and MIPs were obtained to evaluate the vascular anatomy. CONTRAST:  80 mL of Isovue 370 IV contrast COMPARISON:  Chest radiograph performed earlier today at 9:23 p.m., and CT of the chest performed 01/23/2007 FINDINGS: Cardiovascular: There is no evidence of significant pulmonary embolus. The heart is mildly enlarged, particularly the right side of the heart, with reflux into the hepatic veins and IVC. Scattered coronary artery calcifications are seen. Scattered calcification is noted along the thoracic aorta and proximal great vessels. Mediastinum/Nodes: The mediastinum is otherwise grossly unremarkable. No mediastinal lymphadenopathy is seen. No pericardial effusion is identified. The thyroid gland is diminutive and grossly unremarkable. No axillary lymphadenopathy is seen. Lungs/Pleura: Mild interstitial prominence is noted at the lung bases, raising question for mild interstitial edema. A few calcified granulomata are seen within the right lung. No pleural effusion or pneumothorax is seen. No masses are identified. Upper Abdomen: The visualized portions of the liver and spleen are grossly unremarkable. Musculoskeletal: No acute osseous abnormalities are identified. There is chronic compression deformity involving the superior endplate of E45. The visualized musculature is unremarkable in appearance. Review of the  MIP images confirms the above findings. IMPRESSION: 1. No evidence of significant  pulmonary embolus. 2. Mild interstitial prominence at the lung bases raises question for mild interstitial edema. 3. Right heart enlargement, with reflux into the hepatic veins and IVC, concerning for some degree of right heart insufficiency. 4. Scattered coronary artery calcifications seen. 5. Chronic compression deformity involving the superior endplate of E83. Electronically Signed   By: Garald Balding M.D.   On: 02/09/2017 23:10   Dg Pelvis Portable  Result Date: 01/27/2017 CLINICAL DATA:  Right hip arthroplasty. EXAM: PORTABLE PELVIS 1-2 VIEWS COMPARISON:  01/27/2017. FINDINGS: Total right hip replacement. Hardware intact. Anatomic alignment. Deformity noted of the pubis bilaterally consistent with old healed fractures. IMPRESSION: 1. Total right hip replacement. Anatomic alignment. Hardware intact. 2. Deformity of the pubis bilaterally consistent with old fractures. Electronically Signed   By: Marcello Moores  Register   On: 01/27/2017 13:45   Dg Chest Portable 1 View  Result Date: 02/09/2017 CLINICAL DATA:  83 year old female with chest pain. Initial encounter. EXAM: PORTABLE CHEST 1 VIEW COMPARISON:  01/02/2017 and earlier. FINDINGS: Portable AP semi upright view at 2123 hours. Stable cardiomegaly and mediastinal contours. Stable lung volumes. Allowing for portable technique the lungs are clear. No pneumothorax or pleural effusion. Negative visible bowel gas pattern. No acute osseous abnormality identified. IMPRESSION: Stable cardiomegaly. No acute cardiopulmonary abnormality. Electronically Signed   By: Genevie Ann M.D.   On: 02/09/2017 21:40   Dg C-arm 61-120 Min  Result Date: 01/27/2017 CLINICAL DATA:  Right sided anterior approach total hip arthroplasty. Reported fluoro time is 32 seconds EXAM: OPERATIVE right HIP (WITH PELVIS IF PERFORMED) 2 fluoro spot VIEWS TECHNIQUE: Fluoroscopic spot image(s) were submitted for interpretation post-operatively. COMPARISON:  None in PACs FINDINGS: The patient has  undergone right total hip joint prosthesis placement. Radiographic positioning of the prosthetic components is good. The interface with the native bone appears normal. IMPRESSION: No immediate postprocedure complication following right total hip arthroplasty. Electronically Signed   By: David  Martinique M.D.   On: 01/27/2017 13:01   Dg Hip Operative Unilat W Or W/o Pelvis Right  Result Date: 01/27/2017 CLINICAL DATA:  Right sided anterior approach total hip arthroplasty. Reported fluoro time is 32 seconds EXAM: OPERATIVE right HIP (WITH PELVIS IF PERFORMED) 2 fluoro spot VIEWS TECHNIQUE: Fluoroscopic spot image(s) were submitted for interpretation post-operatively. COMPARISON:  None in PACs FINDINGS: The patient has undergone right total hip joint prosthesis placement. Radiographic positioning of the prosthetic components is good. The interface with the native bone appears normal. IMPRESSION: No immediate postprocedure complication following right total hip arthroplasty. Electronically Signed   By: David  Martinique M.D.   On: 01/27/2017 13:01    EKG  EKG Interpretation  Date/Time:  Sunday February 09 2017 21:11:17 EDT Ventricular Rate:  149 PR Interval:    QRS Duration: 76 QT Interval:  250 QTC Calculation: 393 R Axis:   -2 Text Interpretation:  Atrial fibrillation with rapid ventricular response Septal infarct , age undetermined Abnormal ECG No significant change since last tracing Confirmed by Winfred Leeds  MD, Kizzi Overbey [phone]) on 02/09/2017 9:18:42 PM      Chest xray viewed by me Radiology No results found.  Procedures Procedures (including critical care time)  Medications Ordered in ED Medications - No data to display   Initial Impression / Assessment and Plan / ED Course  I have reviewed the triage vital signs and the nursing notes.  Pertinent labs & imaging results that were available during my care of  the patient were reviewed by me and considered in my medical decision making (see chart for  details).    10:10 PM patient breathing comfortably after treatment with intravenous diltiazem. Heart rate 1 15 bpm, atrial fib. CT angiogam chest ordered as patient is recent ORIF of hip tachycardic and short of breath.  11:40 PM patient complains of spasm in her back. Heart rate ranges from 125-140. Atrial fibrillation. Intravenous Cardizem drip ordered. IV Ativan ordered for spasm and back. Cardiologist on call consulted to evaluate patient in the emergency department and arrange for overnight stay. 12:30 AM back spasm is improved after treatment with intravenous lorazepam. Final Clinical Impressions(s) / ED Diagnoses  Diagnosis #1 atrial fibrillation with rapid ventricular response Final diagnoses:  None  #2 back pain CRITICAL CARE Performed by: Orlie Dakin Total critical care time: 30 minutes Critical care time was exclusive of separately billable procedures and treating other patients. Critical care was necessary to treat or prevent imminent or life-threatening deterioration. Critical care was time spent personally by me on the following activities: development of treatment plan with patient and/or surrogate as well as nursing, discussions with consultants, evaluation of patient's response to treatment, examination of patient, obtaining history from patient or surrogate, ordering and performing treatments and interventions, ordering and review of laboratory studies, ordering and review of radiographic studies, pulse oximetry and re-evaluation of patient's condition. New Prescriptions New Prescriptions   No medications on file     Orlie Dakin, MD 02/10/17 [phone]

## 2017-02-10 ENCOUNTER — Encounter (HOSPITAL_COMMUNITY): Payer: Self-pay | Admitting: *Deleted

## 2017-02-10 ENCOUNTER — Inpatient Hospital Stay (HOSPITAL_COMMUNITY): Payer: Medicare Other

## 2017-02-10 ENCOUNTER — Ambulatory Visit (HOSPITAL_COMMUNITY): Payer: Medicare Other | Admitting: Nurse Practitioner

## 2017-02-10 DIAGNOSIS — I4891 Unspecified atrial fibrillation: Secondary | ICD-10-CM | POA: Diagnosis not present

## 2017-02-10 DIAGNOSIS — M87051 Idiopathic aseptic necrosis of right femur: Secondary | ICD-10-CM | POA: Diagnosis not present

## 2017-02-10 DIAGNOSIS — I10 Essential (primary) hypertension: Secondary | ICD-10-CM | POA: Diagnosis not present

## 2017-02-10 DIAGNOSIS — I4819 Other persistent atrial fibrillation: Secondary | ICD-10-CM | POA: Diagnosis present

## 2017-02-10 LAB — CBC WITH DIFFERENTIAL/PLATELET
BASOS ABS: 0 10*3/uL (ref 0.0–0.1)
Basophils Relative: 0 %
EOS ABS: 0.1 10*3/uL (ref 0.0–0.7)
EOS PCT: 2 %
HCT: 31.7 % — ABNORMAL LOW (ref 36.0–46.0)
Hemoglobin: 10 g/dL — ABNORMAL LOW (ref 12.0–15.0)
LYMPHS PCT: 25 %
Lymphs Abs: 1.9 10*3/uL (ref 0.7–4.0)
MCH: 31.4 pg (ref 26.0–34.0)
MCHC: 31.5 g/dL (ref 30.0–36.0)
MCV: 99.7 fL (ref 78.0–100.0)
MONO ABS: 0.8 10*3/uL (ref 0.1–1.0)
Monocytes Relative: 11 %
Neutro Abs: 4.6 10*3/uL (ref 1.7–7.7)
Neutrophils Relative %: 62 %
PLATELETS: 231 10*3/uL (ref 150–400)
RBC: 3.18 MIL/uL — AB (ref 3.87–5.11)
RDW: 17 % — AB (ref 11.5–15.5)
WBC: 7.4 10*3/uL (ref 4.0–10.5)

## 2017-02-10 LAB — MAGNESIUM: Magnesium: 1.8 mg/dL (ref 1.7–2.4)

## 2017-02-10 LAB — URINALYSIS, ROUTINE W REFLEX MICROSCOPIC
BILIRUBIN URINE: NEGATIVE
Glucose, UA: NEGATIVE mg/dL
HGB URINE DIPSTICK: NEGATIVE
KETONES UR: NEGATIVE mg/dL
Leukocytes, UA: NEGATIVE
Nitrite: NEGATIVE
Protein, ur: NEGATIVE mg/dL
Specific Gravity, Urine: 1.044 — ABNORMAL HIGH (ref 1.005–1.030)
pH: 7 (ref 5.0–8.0)

## 2017-02-10 LAB — ECHOCARDIOGRAM LIMITED
HEIGHTINCHES: 61.5 in
WEIGHTICAEL: 2203.2 [oz_av]

## 2017-02-10 LAB — BASIC METABOLIC PANEL
Anion gap: 9 (ref 5–15)
BUN: 13 mg/dL (ref 6–20)
CALCIUM: 8.5 mg/dL — AB (ref 8.9–10.3)
CO2: 27 mmol/L (ref 22–32)
CREATININE: 0.8 mg/dL (ref 0.44–1.00)
Chloride: 104 mmol/L (ref 101–111)
GFR calc Af Amer: >60 mL/min (ref >60)
GLUCOSE: 93 mg/dL (ref 65–99)
POTASSIUM: 3.4 mmol/L — AB (ref 3.5–5.1)
SODIUM: 140 mmol/L (ref 135–145)

## 2017-02-10 LAB — PROTIME-INR
INR: 3.01
PROTHROMBIN TIME: 31.9 s — AB (ref 11.4–15.2)

## 2017-02-10 MED ORDER — ONDANSETRON HCL 4 MG/2ML IJ SOLN: 4.0000 mg | Freq: Four times a day (QID) | INTRAMUSCULAR | Status: DC | PRN

## 2017-02-10 MED ORDER — WARFARIN - PHARMACIST DOSING INPATIENT
Freq: Every day | Status: DC
Start: 2017-02-10 — End: 2017-02-10

## 2017-02-10 MED ORDER — DILTIAZEM HCL-DEXTROSE 100-5 MG/100ML-% IV SOLN (PREMIX)
5.0000 mg/h | INTRAVENOUS | Status: DC
  Administered 2017-02-10: 15 mg/h via INTRAVENOUS
  Filled 2017-02-10: qty 100

## 2017-02-10 MED ORDER — DOCUSATE SODIUM 100 MG PO CAPS
100.0000 mg | ORAL_CAPSULE | Freq: Two times a day (BID) | ORAL | Status: DC
  Administered 2017-02-10: 100 mg via ORAL
  Filled 2017-02-10: qty 1

## 2017-02-10 MED ORDER — DILTIAZEM HCL ER COATED BEADS 360 MG PO CP24: 360.0000 mg | ORAL_CAPSULE | Freq: Every day | ORAL | 5 refills | Status: DC

## 2017-02-10 MED ORDER — DILTIAZEM HCL ER COATED BEADS 180 MG PO CP24
360.0000 mg | ORAL_CAPSULE | Freq: Every day | ORAL | Status: DC
  Administered 2017-02-10: 360 mg via ORAL
  Filled 2017-02-10: qty 2

## 2017-02-10 MED ORDER — POTASSIUM CHLORIDE 20 MEQ/15ML (10%) PO SOLN
20.0000 meq | Freq: Every day | ORAL | Status: DC
  Administered 2017-02-10: 20 meq via ORAL
  Filled 2017-02-10: qty 15

## 2017-02-10 MED ORDER — WARFARIN SODIUM 2.5 MG PO TABS
2.5000 mg | ORAL_TABLET | Freq: Once | ORAL | Status: DC
  Filled 2017-02-10: qty 1

## 2017-02-10 MED ORDER — PINDOLOL 10 MG PO TABS
20.0000 mg | ORAL_TABLET | Freq: Every day | ORAL | Status: DC
  Administered 2017-02-10: 20 mg via ORAL
  Filled 2017-02-10: qty 2

## 2017-02-10 MED ORDER — ACETAMINOPHEN 325 MG PO TABS: 650.0000 mg | ORAL_TABLET | ORAL | Status: DC | PRN

## 2017-02-10 MED ORDER — GABAPENTIN 100 MG PO CAPS
100.0000 mg | ORAL_CAPSULE | Freq: Every day | ORAL | Status: DC
  Administered 2017-02-10: 100 mg via ORAL
  Filled 2017-02-10: qty 1

## 2017-02-10 MED ORDER — FUROSEMIDE 20 MG PO TABS
20.0000 mg | ORAL_TABLET | Freq: Every day | ORAL | Status: DC
Start: 2017-02-10 — End: 2017-02-10
  Administered 2017-02-10: 20 mg via ORAL
  Filled 2017-02-10: qty 1

## 2017-02-10 MED ORDER — LISINOPRIL 40 MG PO TABS
40.0000 mg | ORAL_TABLET | Freq: Every day | ORAL | Status: DC
  Filled 2017-02-10: qty 1

## 2017-02-10 MED ORDER — WARFARIN SODIUM 2.5 MG PO TABS: 2.5000 mg | ORAL_TABLET | ORAL | Status: DC

## 2017-02-10 MED ORDER — DILTIAZEM HCL ER COATED BEADS 180 MG PO CP24: 180.0000 mg | ORAL_CAPSULE | Freq: Every day | ORAL | Status: DC

## 2017-02-10 MED ORDER — TIZANIDINE HCL 2 MG PO TABS
2.0000 mg | ORAL_TABLET | Freq: Three times a day (TID) | ORAL | Status: DC | PRN
  Filled 2017-02-10: qty 1

## 2017-02-10 MED ORDER — PINDOLOL 5 MG PO TABS
15.0000 mg | ORAL_TABLET | Freq: Every day | ORAL | Status: DC
  Filled 2017-02-10 (×2): qty 1

## 2017-02-10 MED ORDER — HYDROCODONE-ACETAMINOPHEN 5-325 MG PO TABS: 1.0000 | ORAL_TABLET | ORAL | Status: DC | PRN

## 2017-02-10 NOTE — Progress Notes (Signed)
ANTICOAGULATION CONSULT NOTE - Follow Up Consult  Pharmacy Consult for warfarin Indication: atrial fibrillation  Allergies  Allergen Reactions  . Benazepril Hcl Cough    Patient Measurements: Height: 5' 1.5" (156.2 cm) Weight: 137 lb 11.2 oz (62.5 kg) IBW/kg (Calculated) : 48.95  Vital Signs: Temp: 98.9 F (37.2 C) (03/26 0759) Temp Source: Oral (03/26 0759) BP: 117/60 (03/26 0827) Pulse Rate: 94 (03/26 0827)  Labs:  Recent Labs  02/09/17 2118 02/09/17 2136 02/10/17 0332  HGB 10.5*  --  10.0*  HCT 34.1*  --  31.7*  PLT 269  --  231  LABPROT  --  32.9* 31.9*  INR  --  3.13 3.01  CREATININE 1.08*  --  0.80    Estimated Creatinine Clearance: 46.6 mL/min (by C-G formula based on SCr of 0.8 mg/dL).   Assessment: 83 y/o female who presents with Afib with HR in 150's. She had a hip replacement on 3/12. She is also on warfarin PTA for Afib. Last dose of warfarin was 3/24 and none given last night. INR on admission was above goal at 3.13, today it is down to 3.01 and at goal. No bleeding noted, Hgb stable in 10's, platelets are normal.  PTA: 5 mg daily except 2.5 mg Mon and Fri  Goal of Therapy:  INR 2-3 Monitor platelets by anticoagulation protocol: Yes   Plan:  - Warfarin 2.5 mg PO tonight - Daily INR - Monitor for s/sx of bleeding   Renold Genta, PharmD, BCPS Clinical Pharmacist Phone for today - [place] - [phone] 02/10/2017 11:36 AM

## 2017-02-10 NOTE — Care Management Note (Signed)
Case Management Note  Patient Details  Name: Susan Fitzgerald MRN: [id number] Date of Birth: 05-22-1932  Subjective/Objective:  Pt presented for Atrial Fib. Pt is from home with husband. Pt was previously active with [facility]. CM did call to clarify with [facility] Liaison to make sure pt is active.                 Action/Plan: CM will continue to monitor for disposition needs.   Expected Discharge Date:  02/11/17               Expected Discharge Plan:     In-House Referral:  NA  Discharge planning Services  CM Consult  Post Acute Care Choice:  Home Health, Resumption of Svcs/PTA Provider Choice offered to:  Patient  DME Arranged:  N/A DME Agency:  NA  HH Arranged:  PT [place] Agency:  [facility] (formerly [employer])  Status of Service:  Completed, signed off  If discussed at [employer], dates discussed:    Additional Comments:  Bethena Roys, RN 02/10/2017, 12:13 PM

## 2017-02-10 NOTE — H&P (Signed)
Patient ID: Susan Fitzgerald MRN: [id number], DOB/AGE: 81-Jun-1933  Admit date: 02/09/2017 Primary Physician: Redge Gainer, MD Primary Cardiologist: Minus Breeding  CARDIOLOGY ADMISSION History & Physical   PATIENT PROFILE   83 year old woman with history of HTN, atrial fibrillation, and recent hip replacement on 01/27/2017. She has a history of afib that was originally controlled on flecainide, but this had to be discontinued due to QT prolongation. She has failed cardioversion in the recent past. She and her primary cardiologist were considering attempting rhythm control strategy for her atrial fibrillation, but the decision was made to address her hip before attempting NSR.   HISTORY OF PRESENT ILLNESS   The patient has been recovering from her recent hip surgery. She has participated with home PT, although not as frequently as indented. Today she noticed increasing shortness of breath and fast heart rate. These symptoms are familiar to her given her long history of atrial fibrillation. No syncope or presyncope. No chest pain. No cough or fevers or chills. She wears compression stockings and has some LE edema on the R (the side of her replacement). No orthopnea. In the ED she was in afib with HR in the 150s. She responded well to IV Cardizem, but the effect was short-lived. She wa splaced on Cardizem infusion and cardiology was consulted for admission.  CT chest was performed that was negative for PE Labs are unremarkable without significant abnormalities  While transferring to the CT scanner, she did experience a worsening in her back spasms which were treated with Ativan  Review of Systems Ten systems reviewed and are otherwise negative except as noted above.   MEDICAL, FAMILY, AND SOCIAL HISTORY   Past Medical History:  Diagnosis Date  . Arthritis   . Bradycardia 2014  . Cataract   . Diverticulosis   . Dyspnea    periodically  . Dysrhythmia    a - fib  . Heart murmur    ??? bruit  . History of stress test    a. 04/09/13-nuclear stress-no ischemia low risk  . Hypertension   . Menopause   . Osteoporosis   . PAF (paroxysmal atrial fibrillation) ([place])    a. s/p prior DCCV; b. On flecainide & coumadin (CHA2DS2VASc = 4);  c. 03/2016 Echo: EF 55-60%, mod LVH, mod AI, mild to mod TR, PASP 51mmHg.  Marland Kitchen Pelvic fracture [facility])     Past Surgical History:  Procedure Laterality Date  . BIOPSY BREAST    . BIOPSY BREAST     right & benign  . BREAST SURGERY    . CARDIOVERSION N/A 10/30/2016   Procedure: CARDIOVERSION;  Surgeon: Larey Dresser, MD;  Location: [place];  Service: Cardiovascular;  Laterality: N/A;  . CATARACT EXTRACTION    . EYE SURGERY     cataracts  . TEE WITHOUT CARDIOVERSION N/A 10/30/2016   Procedure: TRANSESOPHAGEAL ECHOCARDIOGRAM (TEE);  Surgeon: Larey Dresser, MD;  Location: [place];  Service: Cardiovascular;  Laterality: N/A;  . TOTAL HIP ARTHROPLASTY Right 01/27/2017   Procedure: TOTAL HIP ARTHROPLASTY ANTERIOR APPROACH;  Surgeon: Rod Can, MD;  Location: [place];  Service: Orthopedics;  Laterality: Right;    Allergies  Allergen Reactions  . Benazepril Hcl Cough   Prior to Admission medications   Medication Sig Start Date End Date Taking? Authorizing Provider  calcium-vitamin D (OSCAL WITH D) 250-125 MG-UNIT per tablet Take 1 tablet by mouth 2 (two) times daily.   Yes Historical Provider, MD  diltiazem (CARDIZEM CD) 180  MG 24 hr capsule Take 1 capsule (180 mg total) by mouth daily. 01/09/17  Yes Minus Breeding, MD  diltiazem (CARDIZEM) 30 MG tablet Take 1 tablet every 4 hours AS NEEDED for heart rate >100 as long as blood pressure >100. 12/11/16  Yes Sherran Needs, NP  folic acid (FOLVITE) 1 MG tablet Take 1 mg by mouth daily.   Yes Historical Provider, MD  furosemide (LASIX) 20 MG tablet Take 1 to 2 tablets by  mouth daily as needed Patient taking differently: TAKE 20 MG DAILY 02/07/16  Yes Chipper Herb, MD  gabapentin  (NEURONTIN) 100 MG capsule Take 1 capsule (100 mg total) by mouth at bedtime. 12/16/16  Yes Chipper Herb, MD  HYDROcodone-acetaminophen (NORCO/VICODIN) 5-325 MG tablet Take 1-2 tablets by mouth every 4 (four) hours as needed (breakthrough pain). 01/28/17  Yes Rod Can, MD  lisinopril (PRINIVIL,ZESTRIL) 40 MG tablet TAKE 1 TABLET BY MOUTH  DAILY 02/05/17  Yes Chipper Herb, MD  Multiple Vitamin (MULTIVITAMIN) capsule Take 1 capsule by mouth daily.   Yes Historical Provider, MD  pindolol (VISKEN) 10 MG tablet Take 15-20 mg by mouth See admin instructions. Take 2 tablets every morning and take 1 and 1/2 tablet at night   Yes Historical Provider, MD  potassium chloride SA (K-DUR,KLOR-CON) 20 MEQ tablet TAKE 1 TABLET BY MOUTH  DAILY 02/05/17  Yes Chipper Herb, MD  tiZANidine (ZANAFLEX) 2 MG tablet Take 2 mg by mouth every 8 (eight) hours as needed for muscle spasms.  01/30/17  Yes Historical Provider, MD  warfarin (COUMADIN) 5 MG tablet Take 0.5-1 tablets (2.5-5 mg total) by mouth daily. Takes 2.5 mg on Mondays and Fridays and 5 mg all other days of the week 01/16/17  Yes Tammy Eckard, PharmD  docusate sodium (COLACE) 100 MG capsule Take 1 capsule (100 mg total) by mouth 2 (two) times daily. Patient not taking: Reported on 02/09/2017 01/28/17   Rod Can, MD  ondansetron (ZOFRAN) 4 MG tablet Take 1 tablet (4 mg total) by mouth every 6 (six) hours as needed for nausea. Patient not taking: Reported on 02/09/2017 01/28/17   Rod Can, MD  senna (SENOKOT) 8.6 MG TABS tablet Take 2 tablets (17.2 mg total) by mouth at bedtime. Patient not taking: Reported on 02/09/2017 01/28/17   Rod Can, MD   Family History  Problem Relation Age of Onset  . Stroke Mother     cerebral hemorrhage  . Heart disease Father     MI  . Heart attack Father   . Vision loss Father   . Heart disease Brother   . Heart attack Brother   . Cancer Maternal Aunt     breast  . Cancer Brother   . Heart disease Brother     . Cancer Daughter     breast   Social History   Social History  . Marital status: Married    Spouse name: N/A  . Number of children: N/A  . Years of education: N/A   Occupational History  . Not on file.   Social History Main Topics  . Smoking status: Never Smoker  . Smokeless tobacco: Never Used  . Alcohol use No  . Drug use: No  . Sexual activity: Yes   Other Topics Concern  . Not on file   Social History Narrative  . No narrative on file    Lives independently with her husband    PHYSICAL EXAM  Blood pressure 133/87, pulse (!) 148, temperature  99.5 F (37.5 C), resp. rate 17, SpO2 97 %.  General: Pleasant, NAD Psych: Normal affect. Neuro: Alert and oriented X 3. Moves all extremities spontaneously. HEENT: Sclera anicteric, noninjected. MMM.  Neck: no bruits. JVP is not elevated. Lungs:  Clear to anterior auscultation Heart: tachycaridc, no m/r/g Abdomen: Soft, non-tender, non-distended, BS + x 4.  Extremities: No clubbing or cyanosis. Edema: 1+BL. DP/PT/Radials 2+ and equal bilaterally.   LABS and STUDIES  Troponin (Point of Care Test)  Recent Labs  02/09/17 2141  TROPIPOC 0.01   No results for input(s): CKTOTAL, CKMB, TROPONINI in the last 72 hours. Lab Results  Component Value Date   WBC 6.7 02/09/2017   HGB 10.5 (L) 02/09/2017   HCT 34.1 (L) 02/09/2017   MCV 100.0 02/09/2017   PLT 269 02/09/2017    Recent Labs Lab 02/09/17 2118  NA 139  K 3.8  CL 102  CO2 27  BUN 16  CREATININE 1.08*  CALCIUM 9.0  GLUCOSE 150*   Lab Results  Component Value Date   CHOL 149 09/16/2016   HDL 65 09/16/2016   LDLCALC 90 08/28/2015   TRIG 73 09/16/2016   No results found for: DDIMER   Radiology/Studies. Independently Reviewed  CT Chest 1. No evidence of significant pulmonary embolus. 2. Mild interstitial prominence at the lung bases raises question for mild interstitial edema. 3. Right heart enlargement, with reflux into the hepatic veins  and IVC, concerning for some degree of right heart insufficiency. 4. Scattered coronary artery calcifications seen. 5. Chronic compression deformity involving the superior endplate of Z60.  ECG was independently reviewed. Afib with RVR, HR 149 bpm  Echocardiogram  03/18/2016 - Left ventricle: The cavity size was normal. Wall thickness was   increased in a pattern of moderate LVH. Systolic function was   normal. The estimated ejection fraction was in the range of 55%   to 60%. - Aortic valve: There was moderate regurgitation. - Tricuspid valve: There was mild-moderate regurgitation. - Pulmonary arteries: Systolic pressure was moderately increased.   PA peak pressure: 53 mm Hg (S).   ASSESSMENT and PLAN   83 year old woman with history of atrial fibrillation and recent hip replacement who presents with afib with RVR  Afib with RVR - Under better control now that patient is on cardizem gtt. Appears relatively euvolemic. - Continue home medications for tonight (oral BB, CCB) - Will attempt to wean off gtt in the AM to orals - Now that patient's hip surgery has been completed, it would be reasonable to readdress rhythm control strategy with possible Tikosyn load. Would need TEE prior to cardioversion. - Repeat limited TTE to eval LV function and AI  Anticoagulation CHA2DS2 - VASc score of 4 with a risk of stroke of 4% - Warfarin for anticoagulation. Dosing per pharmacy.  HTN - Continue Diltiazem, 180 mg daily, Lisinopril 40 mg daily, Pindolol 20 mg in AM and 15 mg in PM  S/P Recent Hip Surgery, post-op pain - Hydrocodone prn - Zianidine for muscle spasms - PT/OT consult.   IVF: None Diet: NPO for possible TEE/Cardioversion DVT PPX: anticoagulated Dispo: Admit to stepdown Code Status: full, confirmed on admission  Kermit Arnette D, MD 02/10/2017, 12:59 AM

## 2017-02-10 NOTE — Discharge Summary (Signed)
Discharge Summary    Patient ID: Susan Fitzgerald,  MRN: [id number], DOB/AGE: 1931/12/03 83 y.o.  Admit date: 02/09/2017 Discharge date: 02/10/2017  Primary Care Provider: Redge Gainer Primary Cardiologist: Dr. Percival Spanish   Discharge Diagnoses    Active Problems:   Hypertension   Avascular necrosis of hip, right [facility])   Atrial fibrillation with rapid ventricular response (HCC)   Atrial fibrillation (HCC)   Allergies Allergies  Allergen Reactions  . Benazepril Hcl Cough    Diagnostic Studies/Procedures    2D Echo 02/10/17 Study Conclusions  - Left ventricle: The cavity size was normal. There was mild   concentric hypertrophy. Systolic function was mildly reduced. The   estimated ejection fraction was in the range of 45% to 50%. Mild,   diffuse hypokinesis. - Aortic valve: Transvalvular velocity was within the normal range.   There was no stenosis. There was moderate regurgitation.   Regurgitation pressure half-time: 471 ms. - Mitral valve: Transvalvular velocity was within the normal range.   There was no evidence for stenosis. There was mild regurgitation. - Right ventricle: The cavity size was mildly dilated. Wall   thickness was normal. Systolic function was normal. - Tricuspid valve: There was moderate-severe regurgitation.    History of Present Illness     83 year old woman with history of HTN, atrial fibrillation, and recent hip replacement on 01/27/2017. She has a history of afib that was originally controlled on flecainide, but this had to be discontinued due to QT prolongation. She has failed cardioversion in the recent past. She and her primary cardiologist were considering attempting rhythm control strategy for her atrial fibrillation, but the decision was made to address her hip before attempting NSR. She is on Coumadin for a/c.  The patient has been recovering from her recent hip surgery. She has participated with home PT. On 02/09/17, she noticed  increasing shortness of breath and fast heart rate. No syncope or presyncope. No chest pain. No cough or fevers or chills. She wears compression stockings and has some LE edema on the R (the side of her replacement). No orthopnea. Given her symptoms, she came to the ED where she was found to have afib with HR in the 150s. CT of chest was performed and was negative for PE. Labs were unremarkable without significant abnormalities. She was placed on IV Cardizem and admitted to telemetry.   Hospital Course     Pt had a good response to IV Cardizem. Rate improved as well as her symptoms She was switched to PO Cardizem 360 mg daily, which was roughly equivalent to her IV dose. She was monitored and rates remained well control after PO conversion. Her BP also remained stable. She had no exertional symptoms ambulating with cardiac rehab. 2D echo was obtained which showed mildly reduced LVEF at 45-50%. She was last seen and examined by Dr. Sallyanne Kuster who determined she was stable for discharge home. It was advised that she follow-up in the atrial fibrillation clinic to discuss further treatment: lifelong rate control versus amiodarone or RF ablation. Her appointment is scheduled for 02/18/17.   Consultants: none   Discharge Vitals Blood pressure 121/61, pulse 75, temperature 98.4 F (36.9 C), temperature source Oral, resp. rate 16, height 5' 1.5" (1.562 m), weight 137 lb 11.2 oz (62.5 kg), SpO2 99 %.  Filed Weights   02/10/17 0238  Weight: 137 lb 11.2 oz (62.5 kg)    Labs & Radiologic Studies    CBC  Recent Labs  02/09/17 2118 02/10/17 0332  WBC 6.7 7.4  NEUTROABS  --  4.6  HGB 10.5* 10.0*  HCT 34.1* 31.7*  MCV 100.0 99.7  PLT 269 671   Basic Metabolic Panel  Recent Labs  02/09/17 2118 02/10/17 0332  NA 139 140  K 3.8 3.4*  CL 102 104  CO2 27 27  GLUCOSE 150* 93  BUN 16 13  CREATININE 1.08* 0.80  CALCIUM 9.0 8.5*  MG 1.8  --    Liver Function Tests No results for input(s): AST,  ALT, ALKPHOS, BILITOT, PROT, ALBUMIN in the last 72 hours. No results for input(s): LIPASE, AMYLASE in the last 72 hours. Cardiac Enzymes No results for input(s): CKTOTAL, CKMB, CKMBINDEX, TROPONINI in the last 72 hours. BNP Invalid input(s): POCBNP D-Dimer No results for input(s): DDIMER in the last 72 hours. Hemoglobin A1C No results for input(s): HGBA1C in the last 72 hours. Fasting Lipid Panel No results for input(s): CHOL, HDL, LDLCALC, TRIG, CHOLHDL, LDLDIRECT in the last 72 hours. Thyroid Function Tests No results for input(s): TSH, T4TOTAL, T3FREE, THYROIDAB in the last 72 hours.  Invalid input(s): FREET3 _____________  Ct Angio Chest Pe W Or Wo Contrast  Result Date: 02/09/2017 CLINICAL DATA:  Acute onset of worsening shortness of breath. Recent hip surgery 2 weeks ago. Initial encounter. EXAM: CT ANGIOGRAPHY CHEST WITH CONTRAST TECHNIQUE: Multidetector CT imaging of the chest was performed using the standard protocol during bolus administration of intravenous contrast. Multiplanar CT image reconstructions and MIPs were obtained to evaluate the vascular anatomy. CONTRAST:  80 mL of Isovue 370 IV contrast COMPARISON:  Chest radiograph performed earlier today at 9:23 p.m., and CT of the chest performed 01/23/2007 FINDINGS: Cardiovascular: There is no evidence of significant pulmonary embolus. The heart is mildly enlarged, particularly the right side of the heart, with reflux into the hepatic veins and IVC. Scattered coronary artery calcifications are seen. Scattered calcification is noted along the thoracic aorta and proximal great vessels. Mediastinum/Nodes: The mediastinum is otherwise grossly unremarkable. No mediastinal lymphadenopathy is seen. No pericardial effusion is identified. The thyroid gland is diminutive and grossly unremarkable. No axillary lymphadenopathy is seen. Lungs/Pleura: Mild interstitial prominence is noted at the lung bases, raising question for mild interstitial  edema. A few calcified granulomata are seen within the right lung. No pleural effusion or pneumothorax is seen. No masses are identified. Upper Abdomen: The visualized portions of the liver and spleen are grossly unremarkable. Musculoskeletal: No acute osseous abnormalities are identified. There is chronic compression deformity involving the superior endplate of I45. The visualized musculature is unremarkable in appearance. Review of the MIP images confirms the above findings. IMPRESSION: 1. No evidence of significant pulmonary embolus. 2. Mild interstitial prominence at the lung bases raises question for mild interstitial edema. 3. Right heart enlargement, with reflux into the hepatic veins and IVC, concerning for some degree of right heart insufficiency. 4. Scattered coronary artery calcifications seen. 5. Chronic compression deformity involving the superior endplate of Y09. Electronically Signed   By: Garald Balding M.D.   On: 02/09/2017 23:10   Dg Pelvis Portable  Result Date: 01/27/2017 CLINICAL DATA:  Right hip arthroplasty. EXAM: PORTABLE PELVIS 1-2 VIEWS COMPARISON:  01/27/2017. FINDINGS: Total right hip replacement. Hardware intact. Anatomic alignment. Deformity noted of the pubis bilaterally consistent with old healed fractures. IMPRESSION: 1. Total right hip replacement. Anatomic alignment. Hardware intact. 2. Deformity of the pubis bilaterally consistent with old fractures. Electronically Signed   By: Marcello Moores  Register   On: 01/27/2017 13:45  Dg Chest Portable 1 View  Result Date: 02/09/2017 CLINICAL DATA:  83 year old female with chest pain. Initial encounter. EXAM: PORTABLE CHEST 1 VIEW COMPARISON:  01/02/2017 and earlier. FINDINGS: Portable AP semi upright view at 2123 hours. Stable cardiomegaly and mediastinal contours. Stable lung volumes. Allowing for portable technique the lungs are clear. No pneumothorax or pleural effusion. Negative visible bowel gas pattern. No acute osseous abnormality  identified. IMPRESSION: Stable cardiomegaly. No acute cardiopulmonary abnormality. Electronically Signed   By: Genevie Ann M.D.   On: 02/09/2017 21:40   Dg C-arm 61-120 Min  Result Date: 01/27/2017 CLINICAL DATA:  Right sided anterior approach total hip arthroplasty. Reported fluoro time is 32 seconds EXAM: OPERATIVE right HIP (WITH PELVIS IF PERFORMED) 2 fluoro spot VIEWS TECHNIQUE: Fluoroscopic spot image(s) were submitted for interpretation post-operatively. COMPARISON:  None in PACs FINDINGS: The patient has undergone right total hip joint prosthesis placement. Radiographic positioning of the prosthetic components is good. The interface with the native bone appears normal. IMPRESSION: No immediate postprocedure complication following right total hip arthroplasty. Electronically Signed   By: David  Martinique M.D.   On: 01/27/2017 13:01   Dg Hip Operative Unilat W Or W/o Pelvis Right  Result Date: 01/27/2017 CLINICAL DATA:  Right sided anterior approach total hip arthroplasty. Reported fluoro time is 32 seconds EXAM: OPERATIVE right HIP (WITH PELVIS IF PERFORMED) 2 fluoro spot VIEWS TECHNIQUE: Fluoroscopic spot image(s) were submitted for interpretation post-operatively. COMPARISON:  None in PACs FINDINGS: The patient has undergone right total hip joint prosthesis placement. Radiographic positioning of the prosthetic components is good. The interface with the native bone appears normal. IMPRESSION: No immediate postprocedure complication following right total hip arthroplasty. Electronically Signed   By: David  Martinique M.D.   On: 01/27/2017 13:01   Disposition   Pt is being discharged home today in good condition.  Follow-up Plans & Appointments    Follow-up Information    CARROLL,DONNA, NP Follow up on 02/18/2017.   Specialties:  Nurse Practitioner, Cardiology Why:  11:00 am. Atrial fibrillation clinic follow-up  Contact information: [place] [address] [phone]           Discharge Instructions    Diet - low sodium heart healthy    Complete by:  As directed    Increase activity slowly    Complete by:  As directed       Discharge Medications   Current Discharge Medication List    CONTINUE these medications which have CHANGED   Details  diltiazem (CARDIZEM CD) 360 MG 24 hr capsule Take 1 capsule (360 mg total) by mouth daily. Qty: 30 capsule, Refills: 5      CONTINUE these medications which have NOT CHANGED   Details  calcium-vitamin D (OSCAL WITH D) 250-125 MG-UNIT per tablet Take 1 tablet by mouth 2 (two) times daily.    diltiazem (CARDIZEM) 30 MG tablet Take 1 tablet every 4 hours AS NEEDED for heart rate >100 as long as blood pressure >100. Qty: 90 tablet, Refills: 6    folic acid (FOLVITE) 1 MG tablet Take 1 mg by mouth daily.    furosemide (LASIX) 20 MG tablet Take 1 to 2 tablets by  mouth daily as needed Qty: 180 tablet, Refills: 1    gabapentin (NEURONTIN) 100 MG capsule Take 1 capsule (100 mg total) by mouth at bedtime. Qty: 90 capsule, Refills: 1    HYDROcodone-acetaminophen (NORCO/VICODIN) 5-325 MG tablet Take 1-2 tablets by mouth every 4 (four) hours as needed (breakthrough  pain). Qty: 50 tablet, Refills: 0    lisinopril (PRINIVIL,ZESTRIL) 40 MG tablet TAKE 1 TABLET BY MOUTH  DAILY Qty: 90 tablet, Refills: 1    Multiple Vitamin (MULTIVITAMIN) capsule Take 1 capsule by mouth daily.    pindolol (VISKEN) 10 MG tablet Take 15-20 mg by mouth See admin instructions. Take 2 tablets every morning and take 1 and 1/2 tablet at night    potassium chloride SA (K-DUR,KLOR-CON) 20 MEQ tablet TAKE 1 TABLET BY MOUTH  DAILY Qty: 90 tablet, Refills: 1    tiZANidine (ZANAFLEX) 2 MG tablet Take 2 mg by mouth every 8 (eight) hours as needed for muscle spasms.     warfarin (COUMADIN) 5 MG tablet Take 0.5-1 tablets (2.5-5 mg total) by mouth daily. Takes 2.5 mg on Mondays and Fridays and 5 mg all other days of the week    docusate sodium  (COLACE) 100 MG capsule Take 1 capsule (100 mg total) by mouth 2 (two) times daily. Qty: 60 capsule, Refills: 1    ondansetron (ZOFRAN) 4 MG tablet Take 1 tablet (4 mg total) by mouth every 6 (six) hours as needed for nausea. Qty: 20 tablet, Refills: 0    senna (SENOKOT) 8.6 MG TABS tablet Take 2 tablets (17.2 mg total) by mouth at bedtime. Qty: 120 each, Refills: 0         Outstanding Labs/Studies   None   Duration of Discharge Encounter   Greater than 30 minutes including physician time.  Signed, Lyda Jester PA-C 02/10/2017, 4:17 PM

## 2017-02-10 NOTE — Care Management Obs Status (Signed)
[place] NOTIFICATION   Patient Details  Name: KELIN NIXON MRN: [id number] Date of Birth: 06-30-1932   Medicare Observation Status Notification Given:  Yes    Bethena Roys, RN 02/10/2017, 4:21 PM

## 2017-02-10 NOTE — Care Management CC44 (Signed)
Condition Code 44 Documentation Completed  Patient Details  Name: Susan Fitzgerald MRN: [id number] Date of Birth: [birth year]   Condition Code 44 given:  Yes Patient signature on Condition Code 44 notice:  Yes Documentation of 2 MD's agreement:  Yes Code 44 added to claim:  Yes    Bethena Roys, RN 02/10/2017, 4:21 PM

## 2017-02-10 NOTE — Evaluation (Signed)
Physical Therapy Evaluation Patient Details Name: Susan Fitzgerald MRN: [id number] DOB: [birth year] Today's Date: 02/10/2017   History of Present Illness  83 yo admitted wtih Afib after recent Rt THA anterior approach 3/12. PMHx: Afib, HTN, osteoporosis  Clinical Impression  Pt very pleasant and frustrated by decreased speed of progression from Rt THA. Pt with decreased activity tolerance, strength, transfers and function who will benefit from acute therapy to maximize mobility, gait and function to decrease caregiver burden. Pt encouraged to walk daily and continue THA HEP.   HR 92 max with activity, sats 98% on RA    Follow Up Recommendations Home health PT    Equipment Recommendations  None recommended by PT    Recommendations for Other Services       Precautions / Restrictions Precautions Precautions: Fall Restrictions Weight Bearing Restrictions: Yes RLE Weight Bearing: Weight bearing as tolerated      Mobility  Bed Mobility Overal bed mobility: Modified Independent                Transfers Overall transfer level: Needs assistance   Transfers: Sit to/from Stand Sit to Stand: Supervision         General transfer comment: supervision for safety and lines  Ambulation/Gait Ambulation/Gait assistance: Min guard Ambulation Distance (Feet): 225 Feet Assistive device: Rolling walker (2 wheeled) Gait Pattern/deviations: Step-through pattern;Decreased stance time - right   Gait velocity interpretation: Below normal speed for age/gender General Gait Details: cues for position in RW and equal step length  Stairs            Wheelchair Mobility    Modified Rankin (Stroke Patients Only)       Balance Overall balance assessment: Needs assistance   Sitting balance-Leahy Scale: Good       Standing balance-Leahy Scale: Fair                               Pertinent Vitals/Pain Pain Assessment: No/denies pain    Home Living  Family/patient expects to be discharged to:: Private residence Living Arrangements: Spouse/significant other Available Help at Discharge: Family;Available PRN/intermittently Type of Home: House Home Access: Stairs to enter Entrance Stairs-Rails: None Entrance Stairs-Number of Steps: 3 Home Layout: One level Home Equipment: Toilet riser;Walker - 2 wheels      Prior Function Level of Independence: Independent with assistive device(s)               Hand Dominance        Extremity/Trunk Assessment   Upper Extremity Assessment Upper Extremity Assessment: Overall WFL for tasks assessed    Lower Extremity Assessment Lower Extremity Assessment: RLE deficits/detail RLE Deficits / Details: decreased strength and AROM post operatively    Cervical / Trunk Assessment Cervical / Trunk Assessment: Normal  Communication   Communication: No difficulties  Cognition Arousal/Alertness: Awake/alert Behavior During Therapy: WFL for tasks assessed/performed Overall Cognitive Status: Within Functional Limits for tasks assessed                                        General Comments      Exercises Total Joint Exercises Hip ABduction/ADduction: AROM;Right;Seated;15 reps Long Arc Quad: AROM;Right;Seated;15 reps Knee Flexion: AAROM;Right;Seated;15 reps   Assessment/Plan    PT Assessment Patient needs continued PT services  PT Problem List Decreased strength;Decreased activity tolerance;Decreased balance;Decreased mobility;Decreased knowledge of use of  DME;Pain       PT Treatment Interventions Gait training;Stair training;Functional mobility training;Therapeutic exercise;DME instruction;Therapeutic activities;Patient/family education    PT Goals (Current goals can be found in the Care Plan section)  Acute Rehab PT Goals Patient Stated Goal: return to walking and golf PT Goal Formulation: With patient/family Time For Goal Achievement: 02/24/17 Potential to  Achieve Goals: Good    Frequency Min 3X/week   Barriers to discharge        Co-evaluation               End of Session Equipment Utilized During Treatment: Gait belt Activity Tolerance: Patient tolerated treatment well Patient left: in chair;with call bell/phone within reach;with family/visitor present Nurse Communication: Mobility status PT Visit Diagnosis: Difficulty in walking, not elsewhere classified (R26.2);Muscle weakness (generalized) (M62.81)    Time: 4975-3005 PT Time Calculation (min) (ACUTE ONLY): 29 min   Charges:     PT Treatments $Gait Training: 8-22 mins $Therapeutic Exercise: 8-22 mins   PT G Codes:        Elwyn Reach, PT [phone]   [place] 02/10/2017, 1:21 PM

## 2017-02-10 NOTE — Progress Notes (Signed)
In atrial fibrillation, rate controlled with diltiazem IV at 15 mg/h. BP 110-120/70s. No dyspnea or angina or dizziness. INR therapeutic for only the last 10 days. Premature to do DCCV. Had ERAF after last cardioversion. Without a new antiarrhythmic, unlikely to have any different result. Cancel CV. Switch to PO diltiazem 360 mg daily, roughly equivalent to current IV dose. Ambulate.  Possible DC later today to follow up in AFib clinic and discuss further treatment: lifelong rate control versus amiodarone or RF ablation.  Sanda Klein, MD, [facility] [phone] office [phone] pager

## 2017-02-10 NOTE — Progress Notes (Signed)
  Echocardiogram 2D Echocardiogram Limited has been performed.  Susan Fitzgerald M 02/10/2017, 10:25 AM

## 2017-02-10 NOTE — Discharge Instructions (Signed)

## 2017-02-10 NOTE — Progress Notes (Signed)
ANTICOAGULATION CONSULT NOTE - Initial Consult  Pharmacy Consult for Coumadin Indication: atrial fibrillation  Allergies  Allergen Reactions  . Benazepril Hcl Cough    Patient Measurements: Height: 5' 1.5" (156.2 cm) Weight: 137 lb 11.2 oz (62.5 kg) IBW/kg (Calculated) : 48.95  Vital Signs: Temp: 99.2 F (37.3 C) (03/26 0238) Temp Source: Oral (03/26 0238) BP: 145/78 (03/26 0238) Pulse Rate: 101 (03/26 0200)  Labs:  Recent Labs  02/09/17 2118 02/09/17 2136  HGB 10.5*  --   HCT 34.1*  --   PLT 269  --   LABPROT  --  32.9*  INR  --  3.13  CREATININE 1.08*  --     Estimated Creatinine Clearance: 34.5 mL/min (A) (by C-G formula based on SCr of 1.08 mg/dL (H)).   Medical History: Past Medical History:  Diagnosis Date  . Arthritis   . Bradycardia 2014  . Cataract   . Diverticulosis   . Dyspnea    periodically  . Dysrhythmia    a - fib  . Heart murmur    ??? bruit  . History of stress test    a. 04/09/13-nuclear stress-no ischemia low risk  . Hypertension   . Menopause   . Osteoporosis   . PAF (paroxysmal atrial fibrillation) ([place])    a. s/p prior DCCV; b. On flecainide & coumadin (CHA2DS2VASc = 4);  c. 03/2016 Echo: EF 55-60%, mod LVH, mod AI, mild to mod TR, PASP 23mmHg.  Marland Kitchen Pelvic fracture (HCC)     Medications:  Prescriptions Prior to Admission  Medication Sig Dispense Refill Last Dose  . calcium-vitamin D (OSCAL WITH D) 250-125 MG-UNIT per tablet Take 1 tablet by mouth 2 (two) times daily.   02/09/2017 at Unknown time  . diltiazem (CARDIZEM CD) 180 MG 24 hr capsule Take 1 capsule (180 mg total) by mouth daily. 90 capsule 3 02/09/2017 at Unknown time  . diltiazem (CARDIZEM) 30 MG tablet Take 1 tablet every 4 hours AS NEEDED for heart rate >100 as long as blood pressure >100. 90 tablet 6 Past Month at Unknown time  . folic acid (FOLVITE) 1 MG tablet Take 1 mg by mouth daily.   02/09/2017 at Unknown time  . furosemide (LASIX) 20 MG tablet Take 1 to 2 tablets  by  mouth daily as needed (Patient taking differently: TAKE 20 MG DAILY) 180 tablet 1 02/09/2017 at Unknown time  . gabapentin (NEURONTIN) 100 MG capsule Take 1 capsule (100 mg total) by mouth at bedtime. 90 capsule 1 02/08/2017 at Unknown time  . HYDROcodone-acetaminophen (NORCO/VICODIN) 5-325 MG tablet Take 1-2 tablets by mouth every 4 (four) hours as needed (breakthrough pain). 50 tablet 0 02/09/2017 at Unknown time  . lisinopril (PRINIVIL,ZESTRIL) 40 MG tablet TAKE 1 TABLET BY MOUTH  DAILY 90 tablet 1 02/09/2017 at Unknown time  . Multiple Vitamin (MULTIVITAMIN) capsule Take 1 capsule by mouth daily.   02/09/2017 at Unknown time  . pindolol (VISKEN) 10 MG tablet Take 15-20 mg by mouth See admin instructions. Take 2 tablets every morning and take 1 and 1/2 tablet at night   02/09/2017 at Unknown time  . potassium chloride SA (K-DUR,KLOR-CON) 20 MEQ tablet TAKE 1 TABLET BY MOUTH  DAILY 90 tablet 1 02/09/2017 at Unknown time  . tiZANidine (ZANAFLEX) 2 MG tablet Take 2 mg by mouth every 8 (eight) hours as needed for muscle spasms.    02/09/2017 at Unknown time  . warfarin (COUMADIN) 5 MG tablet Take 0.5-1 tablets (2.5-5 mg total) by mouth  daily. Takes 2.5 mg on Mondays and Fridays and 5 mg all other days of the week   02/08/2017 at 2100  . docusate sodium (COLACE) 100 MG capsule Take 1 capsule (100 mg total) by mouth 2 (two) times daily. (Patient not taking: Reported on 02/09/2017) 60 capsule 1 Not Taking at Unknown time  . ondansetron (ZOFRAN) 4 MG tablet Take 1 tablet (4 mg total) by mouth every 6 (six) hours as needed for nausea. (Patient not taking: Reported on 02/09/2017) 20 tablet 0 Completed Course at Unknown time  . senna (SENOKOT) 8.6 MG TABS tablet Take 2 tablets (17.2 mg total) by mouth at bedtime. (Patient not taking: Reported on 02/09/2017) 120 each 0 Not Taking at Unknown time    Assessment: 83 y.o. F presents with afib with HR in the 150s. Pt on coumadin PTA for afib. INR 3.13 on admission. Hgb low  but stable, plt wnl. Home dose: 5mg  daily except for 2.5mg  on Mon and Fri - last dose 3/24  Goal of Therapy:  INR 2-3 Monitor platelets by anticoagulation protocol: Yes   Plan:  Daily INR  Sherlon Handing, PharmD, BCPS Clinical pharmacist, pager [phone] 02/10/2017,2:41 AM

## 2017-02-11 ENCOUNTER — Ambulatory Visit (HOSPITAL_COMMUNITY): Payer: Medicare Other | Admitting: Nurse Practitioner

## 2017-02-11 DIAGNOSIS — Z96641 Presence of right artificial hip joint: Secondary | ICD-10-CM | POA: Diagnosis not present

## 2017-02-11 DIAGNOSIS — Z471 Aftercare following joint replacement surgery: Secondary | ICD-10-CM | POA: Diagnosis not present

## 2017-02-12 ENCOUNTER — Ambulatory Visit (INDEPENDENT_AMBULATORY_CARE_PROVIDER_SITE_OTHER): Payer: Medicare Other | Admitting: Pharmacist

## 2017-02-12 ENCOUNTER — Telehealth (HOSPITAL_COMMUNITY): Payer: Self-pay | Admitting: *Deleted

## 2017-02-12 DIAGNOSIS — I482 Chronic atrial fibrillation, unspecified: Secondary | ICD-10-CM

## 2017-02-12 DIAGNOSIS — Z7901 Long term (current) use of anticoagulants: Secondary | ICD-10-CM | POA: Diagnosis not present

## 2017-02-12 LAB — COAGUCHEK XS/INR WAIVED
INR: 3.1 — ABNORMAL HIGH (ref 0.9–1.1)
Prothrombin Time: 37.3 s

## 2017-02-12 MED ORDER — DILTIAZEM HCL ER COATED BEADS 180 MG PO CP24: 180.0000 mg | ORAL_CAPSULE | Freq: Two times a day (BID) | ORAL | Status: DC

## 2017-02-12 NOTE — Telephone Encounter (Signed)
Patient called in stating she is very weak and dizzy. HR is in the 60s but BP running 83/60. She has taken her cardizem this morning but no other medications. Instructed pt to drink couple extra glasses of water this morning and do not take lisinopril or pindolol and report what BP was this afternoon. Left message with patient at 230pm to follow up.

## 2017-02-12 NOTE — Telephone Encounter (Signed)
Pt bp still 104/72 HR 96. Feeling some better but still feels weak. Discussed with Roderic Palau NP will hold lisinopril for now, change cardizem to 180mg  BID instead of cardizem 360mg  daily. Hold pindolol tonight and resume tomorrow if HR/BP back to normal ranges. If her BP starts to trend upwards she can resume her lisinopril before follow up next week. Pt verbalized understanding.

## 2017-02-13 ENCOUNTER — Telehealth: Payer: Self-pay | Admitting: Family Medicine

## 2017-02-13 DIAGNOSIS — I700 Atherosclerosis of aorta: Secondary | ICD-10-CM | POA: Diagnosis not present

## 2017-02-13 DIAGNOSIS — I482 Chronic atrial fibrillation: Secondary | ICD-10-CM | POA: Diagnosis not present

## 2017-02-13 DIAGNOSIS — M069 Rheumatoid arthritis, unspecified: Secondary | ICD-10-CM | POA: Diagnosis not present

## 2017-02-13 DIAGNOSIS — M810 Age-related osteoporosis without current pathological fracture: Secondary | ICD-10-CM | POA: Diagnosis not present

## 2017-02-13 DIAGNOSIS — Z96641 Presence of right artificial hip joint: Secondary | ICD-10-CM | POA: Diagnosis not present

## 2017-02-13 DIAGNOSIS — Z79891 Long term (current) use of opiate analgesic: Secondary | ICD-10-CM | POA: Diagnosis not present

## 2017-02-13 DIAGNOSIS — Z471 Aftercare following joint replacement surgery: Secondary | ICD-10-CM | POA: Diagnosis not present

## 2017-02-13 DIAGNOSIS — Z7901 Long term (current) use of anticoagulants: Secondary | ICD-10-CM | POA: Diagnosis not present

## 2017-02-13 NOTE — Telephone Encounter (Signed)
Pt seen at [facility] 3/25 wanted to know if any tests that were done mentioned her back. These did not. She has had issues with her back since that hospital visit. appt made for Monday she would like xrays

## 2017-02-17 ENCOUNTER — Ambulatory Visit (INDEPENDENT_AMBULATORY_CARE_PROVIDER_SITE_OTHER): Payer: Medicare Other | Admitting: Family

## 2017-02-17 ENCOUNTER — Encounter: Payer: Self-pay | Admitting: Family

## 2017-02-17 ENCOUNTER — Ambulatory Visit (INDEPENDENT_AMBULATORY_CARE_PROVIDER_SITE_OTHER): Payer: Medicare Other

## 2017-02-17 VITALS — BP 132/75 | HR 69 | Temp 97.3°F | Ht 61.5 in | Wt 130.6 lb

## 2017-02-17 DIAGNOSIS — Z471 Aftercare following joint replacement surgery: Secondary | ICD-10-CM | POA: Diagnosis not present

## 2017-02-17 DIAGNOSIS — I700 Atherosclerosis of aorta: Secondary | ICD-10-CM | POA: Diagnosis not present

## 2017-02-17 DIAGNOSIS — I4891 Unspecified atrial fibrillation: Secondary | ICD-10-CM

## 2017-02-17 DIAGNOSIS — Z79891 Long term (current) use of opiate analgesic: Secondary | ICD-10-CM | POA: Diagnosis not present

## 2017-02-17 DIAGNOSIS — K5900 Constipation, unspecified: Secondary | ICD-10-CM | POA: Diagnosis not present

## 2017-02-17 DIAGNOSIS — R1084 Generalized abdominal pain: Secondary | ICD-10-CM | POA: Diagnosis not present

## 2017-02-17 DIAGNOSIS — M545 Low back pain, unspecified: Secondary | ICD-10-CM

## 2017-02-17 DIAGNOSIS — R110 Nausea: Secondary | ICD-10-CM

## 2017-02-17 DIAGNOSIS — Z7901 Long term (current) use of anticoagulants: Secondary | ICD-10-CM | POA: Diagnosis not present

## 2017-02-17 DIAGNOSIS — I482 Chronic atrial fibrillation, unspecified: Secondary | ICD-10-CM

## 2017-02-17 DIAGNOSIS — M069 Rheumatoid arthritis, unspecified: Secondary | ICD-10-CM | POA: Diagnosis not present

## 2017-02-17 DIAGNOSIS — Z96641 Presence of right artificial hip joint: Secondary | ICD-10-CM | POA: Diagnosis not present

## 2017-02-17 DIAGNOSIS — M810 Age-related osteoporosis without current pathological fracture: Secondary | ICD-10-CM | POA: Diagnosis not present

## 2017-02-17 LAB — MICROSCOPIC EXAMINATION

## 2017-02-17 LAB — URINALYSIS, COMPLETE
Bilirubin, UA: NEGATIVE
GLUCOSE, UA: NEGATIVE
Leukocytes, UA: NEGATIVE
NITRITE UA: NEGATIVE
PH UA: 7 (ref 5.0–7.5)
SPEC GRAV UA: 1.02 (ref 1.005–1.030)
Urobilinogen, Ur: 0.2 mg/dL (ref 0.2–1.0)

## 2017-02-17 LAB — COAGUCHEK XS/INR WAIVED
INR: 2.8 — ABNORMAL HIGH (ref 0.9–1.1)
PROTHROMBIN TIME: 33.9 s

## 2017-02-17 MED ORDER — ONDANSETRON 4 MG PO TBDP: 4.0000 mg | ORAL_TABLET | Freq: Three times a day (TID) | ORAL | 0 refills | Status: DC | PRN

## 2017-02-17 NOTE — Patient Instructions (Signed)

## 2017-02-17 NOTE — Progress Notes (Signed)
Subjective:    Patient ID: Susan Fitzgerald, female    DOB: [birth year], 83 y.o.   MRN: [id number]  PT had right hip surgery on 01/27/17 and states three days after surgery she started having sharp back pain. States she is having constipation and is staking stool softeners.   PT has A Fib and takes warfarin. Stable at this time.  Back Pain  The current episode started 1 to 4 weeks ago. The problem occurs constantly. The problem has been waxing and waning since onset. The pain is present in the lumbar spine. The quality of the pain is described as shooting. Radiates to: RLQ and LLQ. The pain is at a severity of 7/10. The pain is moderate. The symptoms are aggravated by standing. Pertinent negatives include no bladder incontinence, bowel incontinence or dysuria. Risk factors include sedentary lifestyle. She has tried bed rest for the symptoms. The treatment provided mild relief.      Review of Systems  Gastrointestinal: Positive for abdominal distention, constipation and nausea. Negative for bowel incontinence.  Genitourinary: Negative for bladder incontinence and dysuria.  Musculoskeletal: Positive for back pain.  All other systems reviewed and are negative.      Objective:   Physical Exam  Constitutional: She is oriented to person, place, and time. She appears well-developed and well-nourished. No distress.  HENT:  Head: Normocephalic.  Eyes: Pupils are equal, round, and reactive to light.  Neck: Normal range of motion. Neck supple. No thyromegaly present.  Cardiovascular: Normal rate, regular rhythm, normal heart sounds and intact distal pulses.   No murmur heard. Pulmonary/Chest: Effort normal and breath sounds normal. No respiratory distress. She has no wheezes.  Abdominal: Soft. Bowel sounds are normal. She exhibits no distension. There is tenderness (generalized abd ).  Musculoskeletal: Normal range of motion. She exhibits no edema or tenderness.  Negative CVA tenderness,  thoracic back pain with twisting and flexion   Neurological: She is alert and oriented to person, place, and time.  Skin: Skin is warm and dry.  Psychiatric: She has a normal mood and affect. Her behavior is normal. Judgment and thought content normal.  Vitals reviewed.  KUB- Large amount of stool present. Preliminary reading by Evelina Dun, FNP [facility]   BP 132/75   Pulse 69   Temp 97.3 F (36.3 C) (Oral)   Ht 5' 1.5" (1.562 m)   Wt 130 lb 9.6 oz (59.2 kg)   BMI 24.28 kg/m      Assessment & Plan:  1. Acute bilateral low back pain without sciatica - Urinalysis, Complete - CBC with Differential/Platelet  2. Generalized abdominal pain - DG Abd 1 View; Future - CBC with Differential/Platelet - ondansetron (ZOFRAN ODT) 4 MG disintegrating tablet; Take 1 tablet (4 mg total) by mouth every 8 (eight) hours as needed for nausea or vomiting.  Dispense: 20 tablet; Refill: 0  3. Constipation, unspecified constipation type - DG Abd 1 View; Future - ondansetron (ZOFRAN ODT) 4 MG disintegrating tablet; Take 1 tablet (4 mg total) by mouth every 8 (eight) hours as needed for nausea or vomiting.  Dispense: 20 tablet; Refill: 0  4. Atrial fibrillation with rapid ventricular response (HCC) - CoaguChek XS/INR Waived  5. Atrial fibrillation, chronic (HCC) - CoaguChek XS/INR Waived  6. Nausea - ondansetron (ZOFRAN ODT) 4 MG disintegrating tablet; Take 1 tablet (4 mg total) by mouth every 8 (eight) hours as needed for nausea or vomiting.  Dispense: 20 tablet; Refill: 0  I believe most of her problems  are coming from constipation Pt to do Miralax BID and if no results tomorrow do Mag Citrate. PT to call if symptoms do not resolve Force fluids Zofran Prescription sent to pharmacy  RTO prn    Evelina Dun, FNP

## 2017-02-18 ENCOUNTER — Telehealth: Payer: Self-pay | Admitting: Family

## 2017-02-18 ENCOUNTER — Telehealth: Payer: Self-pay | Admitting: Family Medicine

## 2017-02-18 ENCOUNTER — Encounter (HOSPITAL_COMMUNITY): Payer: Self-pay | Admitting: Nurse Practitioner

## 2017-02-18 ENCOUNTER — Ambulatory Visit (HOSPITAL_COMMUNITY)
Admission: RE | Admit: 2017-02-18 | Discharge: 2017-02-18 | Disposition: A | Discharge status: Home / Self Care | Payer: Medicare Other | Source: Ambulatory Visit | Attending: Nurse Practitioner | Admitting: Nurse Practitioner

## 2017-02-18 VITALS — BP 126/84 | HR 110 | Ht 61.5 in | Wt 132.8 lb

## 2017-02-18 DIAGNOSIS — Z8249 Family history of ischemic heart disease and other diseases of the circulatory system: Secondary | ICD-10-CM | POA: Insufficient documentation

## 2017-02-18 DIAGNOSIS — I10 Essential (primary) hypertension: Secondary | ICD-10-CM | POA: Insufficient documentation

## 2017-02-18 DIAGNOSIS — Z7901 Long term (current) use of anticoagulants: Secondary | ICD-10-CM | POA: Diagnosis not present

## 2017-02-18 DIAGNOSIS — M810 Age-related osteoporosis without current pathological fracture: Secondary | ICD-10-CM | POA: Diagnosis not present

## 2017-02-18 DIAGNOSIS — M069 Rheumatoid arthritis, unspecified: Secondary | ICD-10-CM | POA: Diagnosis not present

## 2017-02-18 DIAGNOSIS — I700 Atherosclerosis of aorta: Secondary | ICD-10-CM | POA: Diagnosis not present

## 2017-02-18 DIAGNOSIS — I481 Persistent atrial fibrillation: Secondary | ICD-10-CM

## 2017-02-18 DIAGNOSIS — Z96641 Presence of right artificial hip joint: Secondary | ICD-10-CM | POA: Diagnosis not present

## 2017-02-18 DIAGNOSIS — Z79899 Other long term (current) drug therapy: Secondary | ICD-10-CM | POA: Diagnosis not present

## 2017-02-18 DIAGNOSIS — Z823 Family history of stroke: Secondary | ICD-10-CM | POA: Insufficient documentation

## 2017-02-18 DIAGNOSIS — I4581 Long QT syndrome: Secondary | ICD-10-CM | POA: Insufficient documentation

## 2017-02-18 DIAGNOSIS — Z471 Aftercare following joint replacement surgery: Secondary | ICD-10-CM | POA: Diagnosis not present

## 2017-02-18 DIAGNOSIS — I482 Chronic atrial fibrillation: Secondary | ICD-10-CM | POA: Diagnosis not present

## 2017-02-18 DIAGNOSIS — Z803 Family history of malignant neoplasm of breast: Secondary | ICD-10-CM | POA: Diagnosis not present

## 2017-02-18 DIAGNOSIS — Z79891 Long term (current) use of opiate analgesic: Secondary | ICD-10-CM | POA: Diagnosis not present

## 2017-02-18 DIAGNOSIS — I4819 Other persistent atrial fibrillation: Secondary | ICD-10-CM

## 2017-02-18 LAB — CBC WITH DIFFERENTIAL/PLATELET
BASOS: 1 %
Basophils Absolute: 0 10*3/uL (ref 0.0–0.2)
EOS (ABSOLUTE): 0.1 10*3/uL (ref 0.0–0.4)
Eos: 2 %
Hematocrit: 34.7 % (ref 34.0–46.6)
Hemoglobin: 11.2 g/dL (ref 11.1–15.9)
Immature Grans (Abs): 0 10*3/uL (ref 0.0–0.1)
Immature Granulocytes: 0 %
Lymphocytes Absolute: 0.8 10*3/uL (ref 0.7–3.1)
Lymphs: 15 %
MCH: 31 pg (ref 26.6–33.0)
MCHC: 32.3 g/dL (ref 31.5–35.7)
MCV: 96 fL (ref 79–97)
MONOS ABS: 0.7 10*3/uL (ref 0.1–0.9)
Monocytes: 12 %
NEUTROS ABS: 3.7 10*3/uL (ref 1.4–7.0)
NEUTROS PCT: 70 %
PLATELETS: 297 10*3/uL (ref 150–379)
RBC: 3.61 x10E6/uL — ABNORMAL LOW (ref 3.77–5.28)
RDW: 15.7 % — AB (ref 12.3–15.4)
WBC: 5.3 10*3/uL (ref 3.4–10.8)

## 2017-02-18 MED ORDER — DILTIAZEM HCL ER COATED BEADS 180 MG PO CP24: 180.0000 mg | ORAL_CAPSULE | Freq: Two times a day (BID) | ORAL | 2 refills | Status: DC

## 2017-02-18 NOTE — Telephone Encounter (Signed)
Noted  

## 2017-02-18 NOTE — Progress Notes (Signed)
Primary Care Physician: Redge Gainer, MD Referring Physician: Ignacia Bayley, NP Cardiologist: Dr. Neomia Dear Susan Fitzgerald is a 83 y.o. female with a h/o paroxysmal afib that is in the afib clinic for evaluation for recent increase in afib burden. She has  history of atrial fibrillation status post prior cardioversion and subsequent antiarrhythmic therapy with flecainide. She is anticoagulated with Coumadin. She also takes pindolol. Over the years, she has had occasional paroxysms of short-lived A. fib. Earlier this year, she had a period of atrial fibrillation, which is usually associated with dyspnea when it occurs, and she took an additional pindolol and this subsequently broke. She was doing well when she last saw Dr. Percival Spanish in June. Since early September however, she has noted more frequent elevated heart rates and irregular heart rhythm. When in A. fib, rates are typically in the mid 90s to low 100s. She records her blood pressures and heart rates on a regular basis and has shown these to me today. Over the past 2 months, she is mostly in the 80s to 90s though occasionally will have rates in the 70s and 60s. She does not think that she has been in atrial fibrillation all of the time over the past 2 months. As above, she does have dyspnea on exertion when in A. fib but denies chest pain, PND, orthopnea, dizziness, syncope, or early satiety. She does have some degree of mild chronic venous stasis and lower extremity swelling and recently taken off amlodipine.  On visit with me, she feels that she has been in afib consistently over the last two weeks. I reviewed her V/S and her heart rate has been elevated closer to 100 bpm. In SR, she has a heart rate closer to 60. Discussion with pt for cardioversion with continuation on flecainide or flecainide washout and change to tikosyn. Pt is also planning to have an epidural Tuesday and will have to stop coumadin tonight. She wants to pursue this prior  to any change in afib management. It was also discussed that coumadin will have to be therapeutic x 3-4 weeks prior to any change in therapy.  Pt returns to afib clinic 11/30, s/p epidural for back pain for which pt had to stop warfarin and as of yet, has become therapeutic since restarting drug. The injection was not that helpful and pt now thinks she may need a hip replacement after the first of the year. Options of  restoring SR were discussed again and she would like to proceed with cardioversion with flecainide to see if flecainide will continue to work for pt. She has had good success with this drug over the years and is not that willing to move on to other options yet. She is wanting to do this as soon as possible so she can then plan for hip surgery. She is aware that she cannot interrupt blood thinner for at least 30 days after cardioversion. If that fails, she would next be interested in [name], if she can afford drug.  She is aware that she will require TEE guided cardioversion since she will not be therapeutic 4 weeks prior to cardioversion.  Returns to afib clinic one week s/p cardioversion. She is in SR but is still having exertional dyspnea. She was hoping that return to SR would improve this. EKG shows more pronounced T waves in the ant/inf leads. She denies exertional chest pain. Her BP was elevated at time of cardioversion and she received IV meds. At home,  it continues to be elevated. It was around 161 systolic on arrival, rechecked at 180/80. She was on 2.5 mg of amlodipine which was stopped in November for mild ankle swelling. She continues to have mild ankle swelling off drug and occasionally worse in rt leg which she will be seeing a orthopedic surgeon re today and may be pending surgery. Gabapentin was added since last visit but pt has not noted too much change in leg discomfort. Qtc is also prolonged today, unsure why, previous ekg's reviewed and qtc was in range. No other new meds to  explain change. She does use prn lasix. BP rechecked at 180/80. Echo in May showed moderate aortic regurg, normal EF with moderate diastolic dysfunction, pulmonary pressure increased at 53 mm Hg.  Return to afib clinic 1/12. She saw Dr. Percival Spanish for her ongoing shortness of breath and needing cardiac clearance for her desired back/hip sugery. However, she was not well rate controlled having stopped flecainide for prolonged QTc. She is wanting surgery so will try to rate control to get her thru surgery and address restoring SR at a later date. Dr.Hochrein started cardizem 30 mg tid which has helped control her heart rate but has contributed to swelling of her left leg, which is the one that is needing surgery. Review of her HR's at [place] indicate that majority of HR is now upper 80's to 90's, occasion low 100's, and would like to see HR's slower in the low 80's's to be in the best shape for surgery. Increased pindolol to 20 mg am and continued 15 mg pm.  She returns today,1/19 with afib poorly controlled, v rates in the 120's, despite increasing BB and CCB recently added. She continues to have LLE which is chronic but slightly more with addition of Cardizem. Review of her history shows  she was on amiodarone at one time but was stopped due to intolerance, possibly due to elevated liver enzymes. Her renal function is calculated  at 40 ml/min, which would probably limit dose of  Tikosyn to 125 mcg to possible 250 mcg bid. She really does not want to come into the hospital. Flecainide was recently stopped due to prolonged qtc and this would be a concern with tikosyn . Sotalol would also be a renal concern since it is contraindicated with renal cl less than 40 ml/min. As persisitent as her afib has been, I do not think that multaq would be effective. She really wants to achieve rate control and be able to have her surgery.  F/u in the afib clinic for a recent ER visit for afib with RVR. Pt recently had her rt hip  replaced and did well during surgery with afib staying rate controlled. However, she has had some issues with ambulating weakness/ pain since being home and last Sunday started having a lot of abdominal discomfort and noted afib with rvr. She was told to go to the ER where her HR was around 150 bpm. She was started on IV dilt and then transitioned to po cardizem. She went home on 360 mg cardizem.  Pt then called a few days after d/c and was c/o of a very low BP at 80 systolic and weakness after taking her am Cardizem dose. HR was controlled. She was advised not to take any more meds that day that would affect BP and after that day to divide the cardizem dose to 180 mg bid.  Since then, BP/HR at home have been better controlled. She is still very weak  with surgery and disappointed that her ambulation is not progressing very quickly.  Today, she denies symptoms of  chest pain,  orthopnea, PND,  dizziness, presyncope, syncope, or neurologic sequela.   Positive for exertional shortness of breath, LEE left worse than rt, rt leg discomfort. The patient is tolerating medications without difficulties and is otherwise without complaint today.   Past Medical History:  Diagnosis Date  . Arthritis   . Bradycardia 2014  . Cataract   . Diverticulosis   . Dyspnea    periodically  . Dysrhythmia    a - fib  . Heart murmur    ??? bruit  . History of stress test    a. 04/09/13-nuclear stress-no ischemia low risk  . Hypertension   . Menopause   . Osteoporosis   . PAF (paroxysmal atrial fibrillation) ([place])    a. s/p prior DCCV; b. On flecainide & coumadin (CHA2DS2VASc = 4);  c. 03/2016 Echo: EF 55-60%, mod LVH, mod AI, mild to mod TR, PASP 45mmHg.  Marland Kitchen Pelvic fracture [facility])    Past Surgical History:  Procedure Laterality Date  . BIOPSY BREAST    . BIOPSY BREAST     right & benign  . BREAST SURGERY    . CARDIOVERSION N/A 10/30/2016   Procedure: CARDIOVERSION;  Surgeon: Larey Dresser, MD;  Location: [place];  Service: Cardiovascular;  Laterality: N/A;  . CATARACT EXTRACTION    . EYE SURGERY     cataracts  . TEE WITHOUT CARDIOVERSION N/A 10/30/2016   Procedure: TRANSESOPHAGEAL ECHOCARDIOGRAM (TEE);  Surgeon: Larey Dresser, MD;  Location: [place];  Service: Cardiovascular;  Laterality: N/A;  . TOTAL HIP ARTHROPLASTY Right 01/27/2017   Procedure: TOTAL HIP ARTHROPLASTY ANTERIOR APPROACH;  Surgeon: Rod Can, MD;  Location: [place];  Service: Orthopedics;  Laterality: Right;    Current Outpatient Prescriptions  Medication Sig Dispense Refill  . calcium-vitamin D (OSCAL WITH D) 250-125 MG-UNIT per tablet Take 1 tablet by mouth 2 (two) times daily.    Marland Kitchen diltiazem (CARDIZEM CD) 180 MG 24 hr capsule Take 1 capsule (180 mg total) by mouth 2 (two) times daily. 180 capsule 2  . diltiazem (CARDIZEM) 30 MG tablet Take 1 tablet every 4 hours AS NEEDED for heart rate >100 as long as blood pressure >100. 90 tablet 6  . docusate sodium (COLACE) 100 MG capsule Take 1 capsule (100 mg total) by mouth 2 (two) times daily. 60 capsule 1  . folic acid (FOLVITE) 1 MG tablet Take 1 mg by mouth daily.    . furosemide (LASIX) 20 MG tablet Take 1 to 2 tablets by  mouth daily as needed (Patient taking differently: TAKE 20 MG DAILY) 180 tablet 1  . gabapentin (NEURONTIN) 100 MG capsule Take 1 capsule (100 mg total) by mouth at bedtime. 90 capsule 1  . HYDROcodone-acetaminophen (NORCO/VICODIN) 5-325 MG tablet Take 1-2 tablets by mouth every 4 (four) hours as needed (breakthrough pain). 50 tablet 0  . lisinopril (PRINIVIL,ZESTRIL) 40 MG tablet TAKE 1 TABLET BY MOUTH  DAILY 90 tablet 1  . Multiple Vitamin (MULTIVITAMIN) capsule Take 1 capsule by mouth daily.    . ondansetron (ZOFRAN ODT) 4 MG disintegrating tablet Take 1 tablet (4 mg total) by mouth every 8 (eight) hours as needed for nausea or vomiting. 20 tablet 0  . pindolol (VISKEN) 10 MG tablet Take 15-20 mg by mouth See admin instructions. Take 2 tablets  every morning and take 1 and 1/2 tablet at night    .  potassium chloride SA (K-DUR,KLOR-CON) 20 MEQ tablet TAKE 1 TABLET BY MOUTH  DAILY 90 tablet 1  . senna (SENOKOT) 8.6 MG TABS tablet Take 2 tablets (17.2 mg total) by mouth at bedtime. 120 each 0  . tiZANidine (ZANAFLEX) 2 MG tablet Take 2 mg by mouth every 8 (eight) hours as needed for muscle spasms.     Marland Kitchen warfarin (COUMADIN) 5 MG tablet Take 0.5-1 tablets (2.5-5 mg total) by mouth daily. Takes 2.5 mg on Mondays and Fridays and 5 mg all other days of the week     No current facility-administered medications for this encounter.     Allergies  Allergen Reactions  . Benazepril Hcl Cough    Social History   Social History  . Marital status: Married    Spouse name: N/A  . Number of children: N/A  . Years of education: N/A   Occupational History  . Not on file.   Social History Main Topics  . Smoking status: Never Smoker  . Smokeless tobacco: Never Used  . Alcohol use No  . Drug use: No  . Sexual activity: Yes   Other Topics Concern  . Not on file   Social History Narrative  . No narrative on file    Family History  Problem Relation Age of Onset  . Stroke Mother     cerebral hemorrhage  . Heart disease Father     MI  . Heart attack Father   . Vision loss Father   . Heart disease Brother   . Heart attack Brother   . Cancer Maternal Aunt     breast  . Cancer Brother   . Heart disease Brother   . Cancer Daughter     breast    ROS- All systems are reviewed and negative except as per the HPI above  Physical Exam: Vitals:   02/18/17 1056  BP: 126/84  Pulse: (!) 110  Weight: 132 lb 12.8 oz (60.2 kg)  Height: 5' 1.5" (1.562 m)    GEN- The patient is well appearing, alert and oriented x 3 today.   Head- normocephalic, atraumatic Eyes-  Sclera clear, conjunctiva pink Ears- hearing intact Oropharynx- clear Neck- supple, no JVP Lymph- no cervical lymphadenopathy Lungs- Clear to ausculation bilaterally,  normal work of breathing Heart-  irregular rate and rhythm, no murmurs, rubs or gallops, PMI not laterally displaced GI- soft, NT, ND, + BS Extremities- no clubbing, cyanosis, or mild LLE, rt greater than left, with compression socks on. MS- no significant deformity or atrophy Skin- no rash or lesion Psych- euthymic mood, full affect Neuro- strength and sensation are intact  EKG- afib at 110 bpm, qrs int 84 ms, qtc 454 ms Epic records reviewed Echo- 03/18/16 LV EF: 55% -   60%  ------------------------------------------------------------------- Indications:      SOB (R06.00).  ------------------------------------------------------------------- Study Conclusions  - Left ventricle: The cavity size was normal. Wall thickness was   increased in a pattern of moderate LVH. Systolic function was   normal. The estimated ejection fraction was in the range of 55%   to 60%. - Aortic valve: There was moderate regurgitation. - Tricuspid valve: There was mild-moderate regurgitation. - Pulmonary arteries: Systolic pressure was moderately increased.  Assessment and Plan: 1. Persistent afib  I think pain/ weakness, general deconditioning from surgery, recently was a contributing factor to her RVR Home readings show HR to be better controlled(less than 100) Continue cardizem at 180 mg bid  Continue pindolol  Continue warfarin Pt is  still interested in the future to try tikosyn to restore rhythm but wants to recover from hip surgery first, I am not sure if qtc prolongation would be an issue with tikosyn, tolerated flecainide for years before qtc prolongation was seen with that drug  2. HTN Stable  3. QTC prolongation Has returned to normal range off flecainide  She will call me with BP/HR numbers in one week and then make further decisions Will discuss with Dr. Rayann Heman in the interium   [place]. Kyrstin Campillo, [facility] [address] [place], [place]  [id number] [phone]

## 2017-02-18 NOTE — Telephone Encounter (Signed)
Returned patient's phone call. Patient states that she had a little BM after drinking two glasses of Miralax and mag citrate.  Informed patient that it can take a little bit for the mag citrate to work.  Patient was going to eat her breakfast to see if that would help.  Patient also states that she has an appt in [place] at 10am today and would drink more miralax when she got back from appt.

## 2017-02-20 ENCOUNTER — Encounter: Payer: Self-pay | Admitting: Pharmacist

## 2017-02-20 DIAGNOSIS — I482 Chronic atrial fibrillation: Secondary | ICD-10-CM | POA: Diagnosis not present

## 2017-02-20 DIAGNOSIS — M069 Rheumatoid arthritis, unspecified: Secondary | ICD-10-CM | POA: Diagnosis not present

## 2017-02-20 DIAGNOSIS — Z471 Aftercare following joint replacement surgery: Secondary | ICD-10-CM | POA: Diagnosis not present

## 2017-02-20 DIAGNOSIS — M810 Age-related osteoporosis without current pathological fracture: Secondary | ICD-10-CM | POA: Diagnosis not present

## 2017-02-20 DIAGNOSIS — Z7901 Long term (current) use of anticoagulants: Secondary | ICD-10-CM | POA: Diagnosis not present

## 2017-02-20 DIAGNOSIS — Z79891 Long term (current) use of opiate analgesic: Secondary | ICD-10-CM | POA: Diagnosis not present

## 2017-02-20 DIAGNOSIS — Z96641 Presence of right artificial hip joint: Secondary | ICD-10-CM | POA: Diagnosis not present

## 2017-02-20 DIAGNOSIS — I700 Atherosclerosis of aorta: Secondary | ICD-10-CM | POA: Diagnosis not present

## 2017-02-26 ENCOUNTER — Telehealth: Payer: Self-pay | Admitting: Family Medicine

## 2017-02-26 DIAGNOSIS — I482 Chronic atrial fibrillation: Secondary | ICD-10-CM | POA: Diagnosis not present

## 2017-02-26 DIAGNOSIS — Z96641 Presence of right artificial hip joint: Secondary | ICD-10-CM | POA: Diagnosis not present

## 2017-02-26 DIAGNOSIS — Z7901 Long term (current) use of anticoagulants: Secondary | ICD-10-CM | POA: Diagnosis not present

## 2017-02-26 DIAGNOSIS — M810 Age-related osteoporosis without current pathological fracture: Secondary | ICD-10-CM | POA: Diagnosis not present

## 2017-02-26 DIAGNOSIS — Z471 Aftercare following joint replacement surgery: Secondary | ICD-10-CM | POA: Diagnosis not present

## 2017-02-26 DIAGNOSIS — M069 Rheumatoid arthritis, unspecified: Secondary | ICD-10-CM | POA: Diagnosis not present

## 2017-02-26 DIAGNOSIS — Z79891 Long term (current) use of opiate analgesic: Secondary | ICD-10-CM | POA: Diagnosis not present

## 2017-02-26 DIAGNOSIS — I700 Atherosclerosis of aorta: Secondary | ICD-10-CM | POA: Diagnosis not present

## 2017-02-26 NOTE — Telephone Encounter (Signed)
Pt called with c/o pain on R flank radiating around to front. Also increased belching Started on Tuesday Pain is intermitent appt scheduled

## 2017-02-27 ENCOUNTER — Telehealth: Payer: Self-pay | Admitting: Pharmacist

## 2017-02-27 ENCOUNTER — Encounter: Payer: Self-pay | Admitting: Family

## 2017-02-27 ENCOUNTER — Ambulatory Visit (INDEPENDENT_AMBULATORY_CARE_PROVIDER_SITE_OTHER): Payer: Medicare Other | Admitting: Family

## 2017-02-27 VITALS — BP 117/69 | HR 79 | Temp 97.0°F | Ht 61.5 in | Wt 128.6 lb

## 2017-02-27 DIAGNOSIS — S39012A Strain of muscle, fascia and tendon of lower back, initial encounter: Secondary | ICD-10-CM

## 2017-02-27 DIAGNOSIS — R109 Unspecified abdominal pain: Secondary | ICD-10-CM | POA: Diagnosis not present

## 2017-02-27 DIAGNOSIS — K219 Gastro-esophageal reflux disease without esophagitis: Secondary | ICD-10-CM | POA: Diagnosis not present

## 2017-02-27 LAB — URINALYSIS, COMPLETE
Bilirubin, UA: NEGATIVE
GLUCOSE, UA: NEGATIVE
KETONES UA: NEGATIVE
Nitrite, UA: NEGATIVE
PROTEIN UA: NEGATIVE
SPEC GRAV UA: 1.01 (ref 1.005–1.030)
Urobilinogen, Ur: 0.2 mg/dL (ref 0.2–1.0)
pH, UA: 7 (ref 5.0–7.5)

## 2017-02-27 LAB — MICROSCOPIC EXAMINATION
Bacteria, UA: NONE SEEN
Epithelial Cells (non renal): NONE SEEN /hpf (ref 0–10)

## 2017-02-27 LAB — CBC WITH DIFFERENTIAL/PLATELET
Basophils Absolute: 0 10*3/uL (ref 0.0–0.2)
Basos: 0 %
EOS (ABSOLUTE): 0.3 10*3/uL (ref 0.0–0.4)
Eos: 4 %
HEMOGLOBIN: 11.6 g/dL (ref 11.1–15.9)
Hematocrit: 36.1 % (ref 34.0–46.6)
IMMATURE GRANS (ABS): 0 10*3/uL (ref 0.0–0.1)
IMMATURE GRANULOCYTES: 0 %
LYMPHS: 28 %
Lymphocytes Absolute: 2.1 10*3/uL (ref 0.7–3.1)
MCH: 30.9 pg (ref 26.6–33.0)
MCHC: 32.1 g/dL (ref 31.5–35.7)
MCV: 96 fL (ref 79–97)
Monocytes Absolute: 0.7 10*3/uL (ref 0.1–0.9)
Monocytes: 10 %
Neutrophils Absolute: 4.4 10*3/uL (ref 1.4–7.0)
Neutrophils: 58 %
PLATELETS: 255 10*3/uL (ref 150–379)
RBC: 3.76 x10E6/uL — ABNORMAL LOW (ref 3.77–5.28)
RDW: 14.8 % (ref 12.3–15.4)
WBC: 7.5 10*3/uL (ref 3.4–10.8)

## 2017-02-27 MED ORDER — PREDNISONE 10 MG (21) PO TBPK: ORAL_TABLET | ORAL | 0 refills | Status: DC

## 2017-02-27 MED ORDER — OMEPRAZOLE 20 MG PO CPDR: 20.0000 mg | DELAYED_RELEASE_CAPSULE | Freq: Every day | ORAL | 1 refills | Status: DC

## 2017-02-27 NOTE — Telephone Encounter (Signed)
Recommend decrease warfarin dose to 2.5mg  = 1/2 tablet daily while taking prednisone.   Restart usual dose of 5mg  qd except 2.5mg  on MWF once finished with prednisone.  Recommend check INR next week.  Patient notified of above recommendations and appt for 03/05/17 at 11am.

## 2017-02-27 NOTE — Patient Instructions (Signed)
Food Choices for Gastroesophageal Reflux Disease, Adult When you have gastroesophageal reflux disease (GERD), the foods you eat and your eating habits are very important. Choosing the right foods can help ease your discomfort. What guidelines do I need to follow?  Choose fruits, vegetables, whole grains, and low-fat dairy products.  Choose low-fat meat, fish, and poultry.  Limit fats such as oils, salad dressings, butter, nuts, and avocado.  Keep a food diary. This helps you identify foods that cause symptoms.  Avoid foods that cause symptoms. These may be different for everyone.  Eat small meals often instead of 3 large meals a day.  Eat your meals slowly, in a place where you are relaxed.  Limit fried foods.  Cook foods using methods other than frying.  Avoid drinking alcohol.  Avoid drinking large amounts of liquids with your meals.  Avoid bending over or lying down until 2-3 hours after eating. What foods are not recommended? These are some foods and drinks that may make your symptoms worse: Vegetables  Tomatoes. Tomato juice. Tomato and spaghetti sauce. Chili peppers. Onion and garlic. Horseradish. Fruits  Oranges, grapefruit, and lemon (fruit and juice). Meats  High-fat meats, fish, and poultry. This includes hot dogs, ribs, ham, sausage, salami, and bacon. Dairy  Whole milk and chocolate milk. Sour cream. Cream. Butter. Ice cream. Cream cheese. Drinks  Coffee and tea. Bubbly (carbonated) drinks or energy drinks. Condiments  Hot sauce. Barbecue sauce. Sweets/Desserts  Chocolate and cocoa. Donuts. Peppermint and spearmint. Fats and Oils  High-fat foods. This includes French fries and potato chips. Other  Vinegar. Strong spices. This includes black pepper, white pepper, red pepper, cayenne, curry powder, cloves, ginger, and chili powder. The items listed above may not be a complete list of foods and drinks to avoid. Contact your dietitian for more information.    This information is not intended to replace advice given to you by your health care provider. Make sure you discuss any questions you have with your health care provider. Document Released: 05/05/2012 Document Revised: 04/11/2016 Document Reviewed: 09/08/2013 Elsevier Interactive Patient Education  2017 Elsevier Inc.  

## 2017-02-27 NOTE — Progress Notes (Signed)
   Subjective:    Patient ID: Susan Fitzgerald, female    DOB: 1932/06/23, 83 y.o.   MRN: [id number]  Flank Pain  This is a new problem. The current episode started in the past 7 days. The problem occurs constantly. The problem has been gradually worsening since onset. Pain location: right flank. The pain is at a severity of 7/10. The pain is moderate. The symptoms are aggravated by lying down and standing. Pertinent negatives include no bladder incontinence, bowel incontinence, dysuria or tingling. (Nausea ) Risk factors include poor posture. She has tried bed rest for the symptoms. The treatment provided no relief.  Gastroesophageal Reflux  She complains of belching and heartburn. This is a new problem. The current episode started 1 to 4 weeks ago. The problem occurs frequently.      Review of Systems  Gastrointestinal: Positive for heartburn. Negative for bowel incontinence.  Genitourinary: Positive for flank pain. Negative for bladder incontinence and dysuria.  Neurological: Negative for tingling.  All other systems reviewed and are negative.      Objective:   Physical Exam  Constitutional: She is oriented to person, place, and time. She appears well-developed and well-nourished. No distress.  HENT:  Head: Normocephalic.  Cardiovascular: Normal rate, normal heart sounds and intact distal pulses.  An irregular rhythm present.  No murmur heard. Pulmonary/Chest: Effort normal and breath sounds normal. No respiratory distress. She has no wheezes.  Abdominal: Soft. Bowel sounds are normal. She exhibits no distension. There is no tenderness.  Musculoskeletal: Normal range of motion. She exhibits no edema or tenderness.  Right flank pain when standing and rotation   Neurological: She is alert and oriented to person, place, and time.  Skin: Skin is warm and dry.  Psychiatric: She has a normal mood and affect. Her behavior is normal. Judgment and thought content normal.  Vitals  reviewed.   BP 117/69   Pulse 79   Temp 97 F (36.1 C) (Oral)   Ht 5' 1.5" (1.562 m)   Wt 128 lb 9.6 oz (58.3 kg)   BMI 23.91 kg/m        Assessment & Plan:  1. Right flank pain - Urinalysis, Complete - Urine culture - CBC with Differential/Platelet  2. Back strain, initial encounter - predniSONE (STERAPRED UNI-PAK 21 TAB) 10 MG (21) TBPK tablet; Use as directed  Dispense: 21 tablet; Refill: 0 - CBC with Differential/Platelet  3. Gastroesophageal reflux disease, esophagitis presence not specified -Diet discussed- Avoid fried, spicy, citrus foods, caffeine and alcohol -Do not eat 2-3 hours before bedtime -Encouraged small frequent meals -Avoid NSAID's -RTO prn - omeprazole (PRILOSEC) 20 MG capsule; Take 1 capsule (20 mg total) by mouth daily.  Dispense: 90 capsule; Refill: 1 - CBC with Differential/Platelet  Urine negative for UTI, culture pending Will give prednisone today for MSK pain and hopefully help with decrease appetite  Pt started on Prilosec today RTO prn   Evelina Dun, FNP

## 2017-02-28 DIAGNOSIS — M810 Age-related osteoporosis without current pathological fracture: Secondary | ICD-10-CM | POA: Diagnosis not present

## 2017-02-28 DIAGNOSIS — Z96641 Presence of right artificial hip joint: Secondary | ICD-10-CM | POA: Diagnosis not present

## 2017-02-28 DIAGNOSIS — I482 Chronic atrial fibrillation: Secondary | ICD-10-CM | POA: Diagnosis not present

## 2017-02-28 DIAGNOSIS — M069 Rheumatoid arthritis, unspecified: Secondary | ICD-10-CM | POA: Diagnosis not present

## 2017-02-28 DIAGNOSIS — I700 Atherosclerosis of aorta: Secondary | ICD-10-CM | POA: Diagnosis not present

## 2017-02-28 DIAGNOSIS — Z471 Aftercare following joint replacement surgery: Secondary | ICD-10-CM | POA: Diagnosis not present

## 2017-02-28 DIAGNOSIS — Z7901 Long term (current) use of anticoagulants: Secondary | ICD-10-CM | POA: Diagnosis not present

## 2017-02-28 DIAGNOSIS — Z79891 Long term (current) use of opiate analgesic: Secondary | ICD-10-CM | POA: Diagnosis not present

## 2017-03-01 ENCOUNTER — Other Ambulatory Visit: Payer: Self-pay | Admitting: Family

## 2017-03-01 LAB — URINE CULTURE

## 2017-03-01 MED ORDER — NITROFURANTOIN MONOHYD MACRO 100 MG PO CAPS: 100.0000 mg | ORAL_CAPSULE | Freq: Two times a day (BID) | ORAL | 0 refills | Status: DC

## 2017-03-03 ENCOUNTER — Encounter: Payer: Self-pay | Admitting: Pharmacist

## 2017-03-03 DIAGNOSIS — M150 Primary generalized (osteo)arthritis: Secondary | ICD-10-CM | POA: Diagnosis not present

## 2017-03-03 DIAGNOSIS — M5030 Other cervical disc degeneration, unspecified cervical region: Secondary | ICD-10-CM | POA: Diagnosis not present

## 2017-03-03 DIAGNOSIS — M0579 Rheumatoid arthritis with rheumatoid factor of multiple sites without organ or systems involvement: Secondary | ICD-10-CM | POA: Diagnosis not present

## 2017-03-03 DIAGNOSIS — R7989 Other specified abnormal findings of blood chemistry: Secondary | ICD-10-CM | POA: Diagnosis not present

## 2017-03-03 DIAGNOSIS — M5136 Other intervertebral disc degeneration, lumbar region: Secondary | ICD-10-CM | POA: Diagnosis not present

## 2017-03-04 DIAGNOSIS — Z79891 Long term (current) use of opiate analgesic: Secondary | ICD-10-CM | POA: Diagnosis not present

## 2017-03-04 DIAGNOSIS — I700 Atherosclerosis of aorta: Secondary | ICD-10-CM | POA: Diagnosis not present

## 2017-03-04 DIAGNOSIS — M069 Rheumatoid arthritis, unspecified: Secondary | ICD-10-CM | POA: Diagnosis not present

## 2017-03-04 DIAGNOSIS — Z471 Aftercare following joint replacement surgery: Secondary | ICD-10-CM | POA: Diagnosis not present

## 2017-03-04 DIAGNOSIS — Z7901 Long term (current) use of anticoagulants: Secondary | ICD-10-CM | POA: Diagnosis not present

## 2017-03-04 DIAGNOSIS — I482 Chronic atrial fibrillation: Secondary | ICD-10-CM | POA: Diagnosis not present

## 2017-03-04 DIAGNOSIS — M810 Age-related osteoporosis without current pathological fracture: Secondary | ICD-10-CM | POA: Diagnosis not present

## 2017-03-04 DIAGNOSIS — Z96641 Presence of right artificial hip joint: Secondary | ICD-10-CM | POA: Diagnosis not present

## 2017-03-05 ENCOUNTER — Encounter: Payer: Self-pay | Admitting: Pharmacist

## 2017-03-05 ENCOUNTER — Ambulatory Visit (INDEPENDENT_AMBULATORY_CARE_PROVIDER_SITE_OTHER): Payer: Medicare Other | Admitting: Family Medicine

## 2017-03-05 ENCOUNTER — Encounter: Payer: Self-pay | Admitting: Family Medicine

## 2017-03-05 VITALS — BP 133/80 | HR 76 | Temp 97.7°F | Ht 61.5 in | Wt 126.0 lb

## 2017-03-05 DIAGNOSIS — M4125 Other idiopathic scoliosis, thoracolumbar region: Secondary | ICD-10-CM | POA: Diagnosis not present

## 2017-03-05 DIAGNOSIS — I482 Chronic atrial fibrillation, unspecified: Secondary | ICD-10-CM

## 2017-03-05 DIAGNOSIS — R202 Paresthesia of skin: Secondary | ICD-10-CM | POA: Diagnosis not present

## 2017-03-05 DIAGNOSIS — M545 Low back pain, unspecified: Secondary | ICD-10-CM

## 2017-03-05 DIAGNOSIS — Z7901 Long term (current) use of anticoagulants: Secondary | ICD-10-CM

## 2017-03-05 LAB — COAGUCHEK XS/INR WAIVED
INR: 2.2 — ABNORMAL HIGH (ref 0.9–1.1)
Prothrombin Time: 26.5 s

## 2017-03-05 MED ORDER — CELECOXIB 200 MG PO CAPS: 200.0000 mg | ORAL_CAPSULE | Freq: Every day | ORAL | 5 refills | Status: DC

## 2017-03-05 MED ORDER — TIZANIDINE HCL 2 MG PO TABS: 2.0000 mg | ORAL_TABLET | Freq: Three times a day (TID) | ORAL | 2 refills | Status: DC | PRN

## 2017-03-05 NOTE — Progress Notes (Signed)
Subjective:  Patient ID: Susan Fitzgerald, female    DOB: 02/07/1932  Age: 83 y.o. MRN: [id number]  CC: Back Pain (pt has had lower back pain x 1 month since her hip surgery and has tried steroids, antibiotics)   HPI Susan Fitzgerald presents for pain radiating from the mid back at the paraspinous musculature through dermatomal distribution around to th eright midaxillary line. The skin hurts to touch. No rash. Concerned about "rashless singles." Pain is moderate.   Patient in for follow-up of atrial fibrillation. Patient denies any recent bouts of chest pain or palpitations. Additionally, patient is taking anticoagulants. Patient denies any recent excessive bleeding episodes including epistaxis, bleeding from the gums, genitalia, rectal bleeding or hematuria. Additionally there has been no excessive bruising.  History Susan Fitzgerald has a past medical history of Arthritis; Bradycardia (2014); Cataract; Diverticulosis; Dyspnea; Dysrhythmia; Heart murmur; History of stress test; Hypertension; Menopause; Osteoporosis; PAF (paroxysmal atrial fibrillation) ([place]); and Pelvic fracture ([place]).   She has a past surgical history that includes Cataract extraction; Biopsy breast; Cardioversion (N/A, 10/30/2016); TEE without cardioversion (N/A, 10/30/2016); Breast surgery; Biopsy breast; Eye surgery; and Total hip arthroplasty (Right, 01/27/2017).   Her family history includes Cancer in her brother, daughter, and maternal aunt; Heart attack in her brother and father; Heart disease in her brother, brother, and father; Stroke in her mother; Vision loss in her father.She reports that she has never smoked. She has never used smokeless tobacco. She reports that she does not drink alcohol or use drugs.    ROS Review of Systems  Constitutional: Negative for activity change, appetite change and fever.  HENT: Negative for congestion, rhinorrhea and sore throat.   Eyes: Negative for visual disturbance.  Respiratory: Negative  for cough and shortness of breath.   Cardiovascular: Negative for chest pain and palpitations.  Gastrointestinal: Negative for abdominal pain, diarrhea and nausea.  Genitourinary: Negative for dysuria.  Musculoskeletal: Positive for back pain and myalgias. Negative for arthralgias.    Objective:  BP 133/80   Pulse 76   Temp 97.7 F (36.5 C) (Oral)   Ht 5' 1.5" (1.562 m)   Wt 126 lb (57.2 kg)   BMI 23.42 kg/m   BP Readings from Last 3 Encounters:  03/05/17 133/80  02/27/17 117/69  02/18/17 126/84    Wt Readings from Last 3 Encounters:  03/05/17 126 lb (57.2 kg)  02/27/17 128 lb 9.6 oz (58.3 kg)  02/18/17 132 lb 12.8 oz (60.2 kg)     Physical Exam  Constitutional: She is oriented to person, place, and time. She appears well-developed and well-nourished. No distress.  HENT:  Head: Normocephalic and atraumatic.  Eyes: Conjunctivae are normal. Pupils are equal, round, and reactive to light.  Neck: Normal range of motion. Neck supple. No thyromegaly present.  Cardiovascular: Normal rate, regular rhythm and normal heart sounds.   No murmur heard. Pulmonary/Chest: Effort normal and breath sounds normal. No respiratory distress. She has no wheezes. She has no rales.  Abdominal: Soft. Bowel sounds are normal. She exhibits no distension. There is no tenderness.  Musculoskeletal: Normal range of motion. She exhibits tenderness (at the midback right spinalis approx T10-L 3).  Lymphadenopathy:    She has no cervical adenopathy.  Neurological: She is alert and oriented to person, place, and time.  Skin: Skin is warm and dry.  Psychiatric: She has a normal mood and affect. Her behavior is normal. Judgment and thought content normal.      Assessment & Plan:  Susan Fitzgerald was seen today for back pain.  Diagnoses and all orders for this visit:  Paresthesias -     Varicella zoster antibody, IgM  Atrial fibrillation, chronic (HCC) -     CoaguChek XS/INR Waived  Chronic  anticoagulation -     CoaguChek XS/INR Waived  Lumbar pain -     Ambulatory referral to Physical Therapy  Other idiopathic scoliosis, thoracolumbar region -     Ambulatory referral to Physical Therapy  Other orders -     celecoxib (CELEBREX) 200 MG capsule; Take 1 capsule (200 mg total) by mouth daily. With food -     tiZANidine (ZANAFLEX) 2 MG tablet; Take 1 tablet (2 mg total) by mouth every 8 (eight) hours as needed for muscle spasms.       I have discontinued Ms. Pont[place]lli's docusate sodium, predniSONE, and nitrofurantoin (macrocrystal-monohydrate). I have also changed her tiZANidine. Additionally, I am having her start on celecoxib. Lastly, I am having her maintain her multivitamin, calcium-vitamin D, furosemide, folic acid, gabapentin, warfarin, HYDROcodone-acetaminophen, senna, potassium chloride SA, lisinopril, pindolol, ondansetron, diltiazem, and omeprazole.  Allergies as of 03/05/2017      Reactions   Benazepril Hcl Cough      Medication List       Accurate as of 03/05/17 11:59 PM. Always use your most recent med list.          calcium-vitamin D 250-125 MG-UNIT tablet Commonly known as:  OSCAL WITH D Take 1 tablet by mouth 2 (two) times daily.   celecoxib 200 MG capsule Commonly known as:  CELEBREX Take 1 capsule (200 mg total) by mouth daily. With food   diltiazem 180 MG 24 hr capsule Commonly known as:  CARDIZEM CD Take 1 capsule (180 mg total) by mouth 2 (two) times daily.   folic acid 1 MG tablet Commonly known as:  FOLVITE Take 1 mg by mouth daily.   furosemide 20 MG tablet Commonly known as:  LASIX Take 1 to 2 tablets by  mouth daily as needed   gabapentin 100 MG capsule Commonly known as:  NEURONTIN Take 1 capsule (100 mg total) by mouth at bedtime.   HYDROcodone-acetaminophen 5-325 MG tablet Commonly known as:  NORCO/VICODIN Take 1-2 tablets by mouth every 4 (four) hours as needed (breakthrough pain).   lisinopril 40 MG tablet Commonly known  as:  PRINIVIL,ZESTRIL TAKE 1 TABLET BY MOUTH  DAILY   multivitamin capsule Take 1 capsule by mouth daily.   omeprazole 20 MG capsule Commonly known as:  PRILOSEC Take 1 capsule (20 mg total) by mouth daily.   ondansetron 4 MG disintegrating tablet Commonly known as:  ZOFRAN ODT Take 1 tablet (4 mg total) by mouth every 8 (eight) hours as needed for nausea or vomiting.   pindolol 10 MG tablet Commonly known as:  VISKEN Take 15-20 mg by mouth See admin instructions. Take 2 tablets every morning and take 1 and 1/2 tablet at night   potassium chloride SA 20 MEQ tablet Commonly known as:  K-DUR,KLOR-CON TAKE 1 TABLET BY MOUTH  DAILY   senna 8.6 MG Tabs tablet Commonly known as:  SENOKOT Take 2 tablets (17.2 mg total) by mouth at bedtime.   tiZANidine 2 MG tablet Commonly known as:  ZANAFLEX Take 1 tablet (2 mg total) by mouth every 8 (eight) hours as needed for muscle spasms.   warfarin 5 MG tablet Commonly known as:  COUMADIN Take 0.5-1 tablets (2.5-5 mg total) by mouth daily. Takes 2.5 mg on Mondays  and Fridays and 5 mg all other days of the week        Follow-up: Return if symptoms worsen or fail to improve.  Claretta Fraise, M.D.

## 2017-03-06 ENCOUNTER — Other Ambulatory Visit: Payer: Self-pay | Admitting: *Deleted

## 2017-03-06 DIAGNOSIS — I700 Atherosclerosis of aorta: Secondary | ICD-10-CM | POA: Diagnosis not present

## 2017-03-06 DIAGNOSIS — N390 Urinary tract infection, site not specified: Secondary | ICD-10-CM

## 2017-03-06 DIAGNOSIS — I482 Chronic atrial fibrillation: Secondary | ICD-10-CM | POA: Diagnosis not present

## 2017-03-06 DIAGNOSIS — Z7901 Long term (current) use of anticoagulants: Secondary | ICD-10-CM | POA: Diagnosis not present

## 2017-03-06 DIAGNOSIS — Z79891 Long term (current) use of opiate analgesic: Secondary | ICD-10-CM | POA: Diagnosis not present

## 2017-03-06 DIAGNOSIS — Z471 Aftercare following joint replacement surgery: Secondary | ICD-10-CM | POA: Diagnosis not present

## 2017-03-06 DIAGNOSIS — M810 Age-related osteoporosis without current pathological fracture: Secondary | ICD-10-CM | POA: Diagnosis not present

## 2017-03-06 DIAGNOSIS — M069 Rheumatoid arthritis, unspecified: Secondary | ICD-10-CM | POA: Diagnosis not present

## 2017-03-06 DIAGNOSIS — Z96641 Presence of right artificial hip joint: Secondary | ICD-10-CM | POA: Diagnosis not present

## 2017-03-06 LAB — VARICELLA ZOSTER ANTIBODY, IGM: Varicella IgM: <0.91 index (ref 0.00–0.90)

## 2017-03-07 DIAGNOSIS — I700 Atherosclerosis of aorta: Secondary | ICD-10-CM | POA: Diagnosis not present

## 2017-03-07 DIAGNOSIS — M810 Age-related osteoporosis without current pathological fracture: Secondary | ICD-10-CM | POA: Diagnosis not present

## 2017-03-07 DIAGNOSIS — Z471 Aftercare following joint replacement surgery: Secondary | ICD-10-CM | POA: Diagnosis not present

## 2017-03-07 DIAGNOSIS — M069 Rheumatoid arthritis, unspecified: Secondary | ICD-10-CM | POA: Diagnosis not present

## 2017-03-07 DIAGNOSIS — Z7901 Long term (current) use of anticoagulants: Secondary | ICD-10-CM | POA: Diagnosis not present

## 2017-03-07 DIAGNOSIS — Z79891 Long term (current) use of opiate analgesic: Secondary | ICD-10-CM | POA: Diagnosis not present

## 2017-03-07 DIAGNOSIS — I482 Chronic atrial fibrillation: Secondary | ICD-10-CM | POA: Diagnosis not present

## 2017-03-07 DIAGNOSIS — Z96641 Presence of right artificial hip joint: Secondary | ICD-10-CM | POA: Diagnosis not present

## 2017-03-11 DIAGNOSIS — Z96641 Presence of right artificial hip joint: Secondary | ICD-10-CM | POA: Diagnosis not present

## 2017-03-11 DIAGNOSIS — Z471 Aftercare following joint replacement surgery: Secondary | ICD-10-CM | POA: Diagnosis not present

## 2017-03-12 ENCOUNTER — Ambulatory Visit: Payer: Medicare Other | Attending: Family Medicine | Admitting: Physical Therapy

## 2017-03-12 ENCOUNTER — Ambulatory Visit (INDEPENDENT_AMBULATORY_CARE_PROVIDER_SITE_OTHER): Payer: Medicare Other | Admitting: Pharmacist

## 2017-03-12 ENCOUNTER — Other Ambulatory Visit: Payer: Self-pay | Admitting: Pharmacist

## 2017-03-12 DIAGNOSIS — Z7901 Long term (current) use of anticoagulants: Secondary | ICD-10-CM | POA: Diagnosis not present

## 2017-03-12 DIAGNOSIS — I482 Chronic atrial fibrillation, unspecified: Secondary | ICD-10-CM

## 2017-03-12 DIAGNOSIS — M545 Low back pain, unspecified: Secondary | ICD-10-CM

## 2017-03-12 DIAGNOSIS — R293 Abnormal posture: Secondary | ICD-10-CM | POA: Diagnosis not present

## 2017-03-12 LAB — COAGUCHEK XS/INR WAIVED
INR: 2.2 — ABNORMAL HIGH (ref 0.9–1.1)
PROTHROMBIN TIME: 26.7 s

## 2017-03-12 NOTE — Patient Outreach (Signed)
[facility]) Care Management  [place]   03/12/2017  FALYNN AILEY Mar 28, 1932 [id number]  Subjective: Patient is a 83 year old female flagged by [facility] system for a medication reconciliation post discharge.  Patient has history of HTN, atrial fibrillation, and recent hip replacement on 01/27/2017. History of Afib that was originally controlled on flecainide, but had to be discontinued due to QT prolongation. She has failed cardioversion in the recent past.  Patient was admitted 02/09/2017 for AFib with RVR. Discharged 8/29/937 with no complications during hospitalization.    Patient handles all her own medications and uses a pill box, which she states helps her keep track of her medications. She has no financial issues with her meds and uses [facility] mail order pharmacy.    Her constipation was a problem for her after her most recent hospitalization and was thought to be contributing to her back pain, however, now she has been having normal bowel movements and takes Miralax as needed.    Patient had been undergoing home therapy for hip surgery, which she states was helpful, and is now undergoing outpatient physical therapy for her back. She is able to walk regularly and unassisted now, after PT for hip surgery, although does have a cane at home if she needs it. She feels fairly steady walking and standing on her own and remains cautious, makes no sudden movements when she stands, in order to avoid falls. Patient states her pain from hip has been ok, still tender, but no more than 1 or 2 out of 10 point pain scale.   She does state that her outpatient physical therapy has been quoted as being $40/visit, however, she has already brought this up to her [facility] patient representative to see if there are any options which are less of a copay.    Patient has a good understanding of her medications, why she is taking them, potential interactions, and  how often she takes them. She uses her pill box to help her remember her medication and updates her medication list after every physician visit. She appreciates the AFib clinic and the phone calls she gets from various health care team members.    Patient has been on warfarin for a long period of time and she feels like it has worked for her, but she is interested in possibly switching to something with less monitoring, such as Eliquis, for her AFib.    Patient was recently discharged from hospital and all medications have been reviewed. Objective:   Encounter Medications: Outpatient Encounter Prescriptions as of 03/12/2017  Medication Sig Note  . calcium-vitamin D (OSCAL WITH D) 250-125 MG-UNIT per tablet Take 1 tablet by mouth 2 (two) times daily.   Marland Kitchen diltiazem (CARDIZEM CD) 180 MG 24 hr capsule Take 1 capsule (180 mg total) by mouth 2 (two) times daily.   . folic acid (FOLVITE) 1 MG tablet Take 1 mg by mouth daily.   . furosemide (LASIX) 20 MG tablet Take 1 to 2 tablets by  mouth daily as needed (Patient taking differently: TAKE 20 MG DAILY)   . gabapentin (NEURONTIN) 100 MG capsule Take 1 capsule (100 mg total) by mouth at bedtime. (Patient taking differently: Take 100 mg by mouth 3 (three) times daily as needed. )   . HYDROcodone-acetaminophen (NORCO/VICODIN) 5-325 MG tablet Take 1-2 tablets by mouth every 4 (four) hours as needed (breakthrough pain). 03/12/2017: Usually takes 1/2 tablet   . lisinopril (PRINIVIL,ZESTRIL) 40 MG tablet  TAKE 1 TABLET BY MOUTH  DAILY   . Multiple Vitamin (MULTIVITAMIN) capsule Take 1 capsule by mouth daily.   . ondansetron (ZOFRAN ODT) 4 MG disintegrating tablet Take 1 tablet (4 mg total) by mouth every 8 (eight) hours as needed for nausea or vomiting.   . penicillin v potassium (VEETID) 500 MG tablet Take 500 mg by mouth 4 (four) times daily.    . pindolol (VISKEN) 10 MG tablet Take 15-20 mg by mouth See admin instructions. Take 2 tablets every morning and take 1  and 1/2 tablet at night   . potassium chloride SA (K-DUR,KLOR-CON) 20 MEQ tablet TAKE 1 TABLET BY MOUTH  DAILY   . tiZANidine (ZANAFLEX) 2 MG tablet Take 1 tablet (2 mg total) by mouth every 8 (eight) hours as needed for muscle spasms.   Marland Kitchen warfarin (COUMADIN) 5 MG tablet Take 0.5-1 tablets (2.5-5 mg total) by mouth daily. Takes 2.5 mg on Mondays and Fridays and 5 mg all other days of the week   . methotrexate (RHEUMATREX) 2.5 MG tablet Take 2.5 mg by mouth once a week.    . [DISCONTINUED] celecoxib (CELEBREX) 200 MG capsule Take 1 capsule (200 mg total) by mouth daily. With food (Patient not taking: Reported on 03/12/2017)   . [DISCONTINUED] omeprazole (PRILOSEC) 20 MG capsule Take 1 capsule (20 mg total) by mouth daily. (Patient not taking: Reported on 03/12/2017)   . [DISCONTINUED] senna (SENOKOT) 8.6 MG TABS tablet Take 2 tablets (17.2 mg total) by mouth at bedtime. (Patient not taking: Reported on 03/12/2017)    No facility-administered encounter medications on file as of 03/12/2017.     Functional Status: In your present state of health, do you have any difficulty performing the following activities: 02/10/2017 01/20/2017  Hearing? N N  Vision? N N  Difficulty concentrating or making decisions? N N  Walking or climbing stairs? Y Y  Dressing or bathing? Y N  Doing errands, shopping? Y Y  Preparing Food and eating ? - -  Using the Toilet? - -  In the past six months, have you accidently leaked urine? - -  Do you have problems with loss of bowel control? - -  Managing your Medications? - -  Managing your Finances? - -  Housekeeping or managing your Housekeeping? - -  Some recent data might be hidden    Fall/Depression Screening: Fall Risk  02/27/2017 01/02/2017 12/17/2016  Falls in the past year? No No No  Risk for fall due to : - - -  Risk for fall due to (comments): - - -   PHQ 2/9 Scores 02/27/2017 01/02/2017 12/17/2016 10/01/2016 09/20/2016 05/07/2016 03/06/2016  PHQ - 2 Score 0 0 0 1 0 0  0  PHQ- 9 Score - - - - - 4 -      Assessment: Drugs sorted by system: Cardiovascular: warfarin, diltiazem, Lasix, Lisinopril, pindolol  Gastrointestinal: Zofran, Miralax   Pain: Gabapentin, Hydrocodone/acetaminophen, tizanidine   Vitamins/Minerals: Oscal, folic acid, MVI, KDur  Infectious diseases: penicillin    Gaps in therapy: none  Medications to avoid in the elderly:  none on med list   Drug interactions:  -Zofran and tizanidine: concurrent use may result in increased risk of QT-interval prolongation (patient rarely uses Zofran, monitor QT interval regularly) -Penicillin and warfarin: concurrent use may result in increased risk of bleeding (patient understands risks and is checked regularly at INR clinic)  -Pindolol and diltiazem: concurrent use may result in increased risk of hypotension and bradycardia (patient has  been well controlled on these medications for a while, patient monitored frequently at AFib clinic)  -Potassium and Lisinopril: concurrent use increases risk of hyperkalemia (patient has been well controlled on both medications for a while, monitor frequently)   Other issues noted:  Patient states she has no history of GERD and does not know why she is prescribed the omeprazole. She is currently not taking omeprazole and Zantac (ranitidine) was recommended as-needed for heartburn    Plan: Post discharge medication reconciliation was performed with patient Will follow up with AFib clinic/anticoagulation pharmacist for the possibility of a transition from warfarin to Eliquis as patient expresses interest. If interested in proceeding with the transition:  . Discontinue warfarin and initiate Eliquis when INR <2  . Patient's most recent stable SCr is 0.80 and her calculated CrCl is 41.7 mL/min, her weight is 57.2 kg, and age is >80 years old.  Recommend Eliquis 2.5 mg orally twice daily due to age and weight.  [facility] pharmacy will not open case at this time.  Please  consult if needed.   Bennye Alm, PharmD, [place] PGY2 Pharmacy Resident [phone] Patient seen with

## 2017-03-12 NOTE — Therapy (Signed)
[facility] [place], [place], [address] Phone: [phone]   Fax:  [phone]  Physical Therapy Evaluation  Patient Details  Name: Susan Fitzgerald MRN: [id number] Date of Birth: 06/15/1932 Referring Provider: Claretta Fraise MD.  Encounter Date: 03/12/2017      PT End of Session - 03/12/17 1224    Visit Number 1   Number of Visits 12   Date for PT Re-Evaluation 04/23/17      Past Medical History:  Diagnosis Date  . Arthritis   . Bradycardia 2014  . Cataract   . Diverticulosis   . Dyspnea    periodically  . Dysrhythmia    a - fib  . Heart murmur    ??? bruit  . History of stress test    a. 04/09/13-nuclear stress-no ischemia low risk  . Hypertension   . Menopause   . Osteoporosis   . PAF (paroxysmal atrial fibrillation) ([place])    a. s/p prior DCCV; b. On flecainide & coumadin (CHA2DS2VASc = 4);  c. 03/2016 Echo: EF 55-60%, mod LVH, mod AI, mild to mod TR, PASP 68mmHg.  Marland Kitchen Pelvic fracture [facility])     Past Surgical History:  Procedure Laterality Date  . BIOPSY BREAST    . BIOPSY BREAST     right & benign  . BREAST SURGERY    . CARDIOVERSION N/A 10/30/2016   Procedure: CARDIOVERSION;  Surgeon: Larey Dresser, MD;  Location: [place];  Service: Cardiovascular;  Laterality: N/A;  . CATARACT EXTRACTION    . EYE SURGERY     cataracts  . TEE WITHOUT CARDIOVERSION N/A 10/30/2016   Procedure: TRANSESOPHAGEAL ECHOCARDIOGRAM (TEE);  Surgeon: Larey Dresser, MD;  Location: [place];  Service: Cardiovascular;  Laterality: N/A;  . TOTAL HIP ARTHROPLASTY Right 01/27/2017   Procedure: TOTAL HIP ARTHROPLASTY ANTERIOR APPROACH;  Surgeon: Rod Can, MD;  Location: [place];  Service: Orthopedics;  Laterality: Right;    There were no vitals filed for this visit.       Subjective Assessment - 03/12/17 1202    Subjective The patient presents to OPPT with c/o right sided low back pain.  She reports her pain  increased follwoing a right total hip replacement.  Her pain can reach levels of 7-8/10.  She reports her pain even radiates toward her right abdomen.  Rest decreases her pain and movement increases her pain.   Pertinent History RA; right total hip replacement.  OP.   Patient Stated Goals Get out of pain.   Currently in Pain? Yes   Pain Score 7    Pain Location Back   Pain Orientation Right   Pain Descriptors / Indicators Aching;Throbbing   Pain Type Acute pain   Pain Onset More than a month ago   Pain Frequency Constant   Aggravating Factors  See above.   Pain Relieving Factors See above.            [facility] PT Assessment - 03/12/17 0001      Assessment   Medical Diagnosis Lumbar pain.   Referring Provider Claretta Fraise MD.   Onset Date/Surgical Date --  01/27/17.     Precautions   Precautions --  Right anterior hip replacement.     Restrictions   Weight Bearing Restrictions No     Balance Screen   Has the patient fallen in the past 6 months No   Has the patient had a decrease in activity level because of a fear of falling?  No   Is the patient reluctant to leave their home because of a fear of falling?  No     Home Environment   Living Environment Private residence     Prior Function   Level of Independence Independent     Posture/Postural Control   Posture/Postural Control Postural limitations   Postural Limitations Rounded Shoulders;Forward head;Decreased lumbar lordosis;Right pelvic obliquity;Flexed trunk;Weight shift left   Posture Comments Right anterior pelvic rotation, right LE longer, left iliac crest higher with compensatory right trunk lean producing some adaptive scoliosis.     ROM / Strength   AROM / PROM / Strength AROM;Strength     AROM   Overall AROM Comments Deferred specific spinal ROM testing due to OP.       Strength   Overall Strength Comments Letf hip abduction= 4/5.     Palpation   Palpation comment Very tender and taut to palpation over  right lumbar erector spinae musculature and QL.     Special Tests    Special Tests --  Left LE longer.     Ambulation/Gait   Assistive device None   Gait Pattern Lateral trunk lean to left;Trunk flexed                   [facility] Adult PT Treatment/Exercise - 03/12/17 0001      Modalities   Modalities Electrical Stimulation;Moist Heat     Moist Heat Therapy   Number Minutes Moist Heat 15 Minutes   Moist Heat Location Lumbar Spine     Electrical Stimulation   Electrical Stimulation Location Lumbar region.   Electrical Stimulation Action IFC   Electrical Stimulation Parameters 80-150 Hz on 100% scan x 15 minutes.   Electrical Stimulation Goals Pain                  PT Short Term Goals - 03/12/17 1223      PT SHORT TERM GOAL #1   Title Independent with a HEP.   Time 4   Period Weeks   Status New           PT Long Term Goals - 03/12/17 1223      PT LONG TERM GOAL #1   Title Patient stand in upright posture.   Time 8   Period Weeks   Status New     PT LONG TERM GOAL #2   Title Equal leg lengths.   Time 8   Period Weeks   Status New     PT LONG TERM GOAL #3   Title Perform ADL's with pain not > 2-3/10.   Time 8   Period Weeks   Status New               Plan - 03/12/17 1215    Clinical Impression Statement The patient presents with at times very high right sided low back pain that radiates into her abdominal region.  She has muliple postural deviations.  Her right lumbar musculature is very tender and taut to palpation.  Limits affect patient's functional mobility and performing ADL's.  Patient will benefit from skilled physical therapy.   Rehab Potential Excellent   PT Frequency 2x / week   PT Duration 6 weeks   PT Treatment/Interventions ADLs/Self Care Home Management;Cryotherapy;Electrical Stimulation;Moist Heat;Ultrasound;Therapeutic activities;Therapeutic exercise;Patient/family education;Manual techniques   PT Next Visit Plan  Electrical stimulation and HMP; Combo e'stim/U/S to right low back musculature.  Perform STW/M to right low back with patient in left sdly position with pillow  between knees and folded pillow under right flank to stretch right low back musculature.  HEP of draw-ins and hip bridges.  Resisted manual right hip extension to attempt to equalize leg lengths.   Consulted and Agree with Plan of Care Patient      Patient will benefit from skilled therapeutic intervention in order to improve the following deficits and impairments:  Abnormal gait, Decreased activity tolerance, Decreased strength, Postural dysfunction, Pain  Visit Diagnosis: Acute right-sided low back pain without sciatica - Plan: PT plan of care cert/re-cert  Abnormal posture - Plan: PT plan of care cert/re-cert      G-Codes - 38/17/71 1200    Functional Assessment Tool Used (Outpatient Only) FOTO...38% limitation.   Functional Limitation Mobility: Walking and moving around   Mobility: Walking and Moving Around Current Status [phone]) At least 20 percent but less than 40 percent impaired, limited or restricted   Mobility: Walking and Moving Around Goal Status [phone]) At least 1 percent but less than 20 percent impaired, limited or restricted       Problem List Patient Active Problem List   Diagnosis Date Noted  . Atrial fibrillation with rapid ventricular response ([place]) 02/10/2017  . Atrial fibrillation ([place]) 02/10/2017  . Avascular necrosis of hip, right ([place]) 01/27/2017  . Avascular necrosis of bone of right hip ([place]) 01/27/2017  . Abdominal aortic atherosclerosis ([place]) 09/24/2016  . Rheumatoid arthritis ([place]) 05/07/2016  . Symptomatic bradycardia 06/02/2013  . Hypertension 04/21/2013  . Osteoporosis 04/21/2013  . Atrial fibrillation, chronic ([place]) 02/09/2013    Caprina Wussow, [name]  MPT 03/12/2017, 12:26 PM  [facility] [address] [place], [place], [address] Phone:  [phone]   Fax:  [phone]  Name: Susan Fitzgerald MRN: [id number] Date of Birth: 05-03-1932

## 2017-03-12 NOTE — Patient Instructions (Signed)
When you restart methotrexate we will need to check your INR / protime more frequently.

## 2017-03-13 ENCOUNTER — Telehealth: Payer: Self-pay | Admitting: Family Medicine

## 2017-03-13 ENCOUNTER — Telehealth: Payer: Self-pay | Admitting: *Deleted

## 2017-03-13 ENCOUNTER — Telehealth (HOSPITAL_COMMUNITY): Payer: Self-pay | Admitting: *Deleted

## 2017-03-13 MED ORDER — APIXABAN 2.5 MG PO TABS
2.5000 mg | ORAL_TABLET | Freq: Two times a day (BID) | ORAL | 0 refills | Status: DC
Start: 2017-03-13 — End: 2017-03-20

## 2017-03-13 NOTE — Telephone Encounter (Signed)
Patient has appt 03/20/17 to see PCP.  Recommend that she not take warfarin 2 days prior to appt. Will check INR 03/20/17 and if less than 2.0 start Eliquis 2.5mg  bid Rx has already been sent to pharmacy - looking for card for 30 days free

## 2017-03-13 NOTE — Telephone Encounter (Signed)
Requesting surgical clearance:   1. Type of surgery: Extractions of a single tooth  2. Surgeon: Dr Gilman Schmidt  3. Surgical date: 04/21/2017  4. Medications that need to be help: Warfarin  5. [place]: (P) [phone]   You saw pt on 11/22/2016, Is pt cleared for Tooth Extractions?  Message send to Cherre Robins, PharmD

## 2017-03-13 NOTE — Telephone Encounter (Signed)
Received notification from [facility] Pharmacist of patient being interested in switching from Coumadin to Eliquis for anticoagulation. Discussed with Roderic Palau NP who is in agreement this would be permissible. Per recommendations she qualifies for Eliquis 2.5mg  twice a day. Instructed pt to be in touch with coumadin management for transition. We will be happy to send in prescription if needed.

## 2017-03-16 NOTE — Telephone Encounter (Signed)
She is OK to have dental extraction.  She can hold warfarin for 5 days prior to dental extraction.  No bridging is necessary.

## 2017-03-17 NOTE — Telephone Encounter (Signed)
Clearance faxed to [facility] via faxed machine and Epic

## 2017-03-18 ENCOUNTER — Ambulatory Visit: Payer: Medicare Other | Attending: Family Medicine | Admitting: Physical Therapy

## 2017-03-18 DIAGNOSIS — M545 Low back pain, unspecified: Secondary | ICD-10-CM

## 2017-03-18 DIAGNOSIS — R293 Abnormal posture: Secondary | ICD-10-CM | POA: Diagnosis not present

## 2017-03-18 NOTE — Therapy (Signed)
[facility] [place], [place], [address] Phone: [phone]   Fax:  [phone]  Physical Therapy Evaluation  Patient Details  Name: Susan Fitzgerald MRN: [id number] Date of Birth: 07-12-1932 Referring Provider: Claretta Fraise MD.  Encounter Date: 03/18/2017      PT End of Session - 03/18/17 1111    Visit Number 2   Number of Visits 12   Date for PT Re-Evaluation 04/23/17   PT Start Time 4235   PT Stop Time 0949   PT Time Calculation (min) 54 min   Activity Tolerance Patient tolerated treatment well   Behavior During Therapy [facility] for tasks assessed/performed      Past Medical History:  Diagnosis Date  . Arthritis   . Bradycardia 2014  . Cataract   . Diverticulosis   . Dyspnea    periodically  . Dysrhythmia    a - fib  . Heart murmur    ??? bruit  . History of stress test    a. 04/09/13-nuclear stress-no ischemia low risk  . Hypertension   . Menopause   . Osteoporosis   . PAF (paroxysmal atrial fibrillation) ([place])    a. s/p prior DCCV; b. On flecainide & coumadin (CHA2DS2VASc = 4);  c. 03/2016 Echo: EF 55-60%, mod LVH, mod AI, mild to mod TR, PASP 90mmHg.  Marland Kitchen Pelvic fracture [facility]     Past Surgical History:  Procedure Laterality Date  . BIOPSY BREAST    . BIOPSY BREAST     right & benign  . BREAST SURGERY    . CARDIOVERSION N/A 10/30/2016   Procedure: CARDIOVERSION;  Surgeon: Larey Dresser, MD;  Location: [place];  Service: Cardiovascular;  Laterality: N/A;  . CATARACT EXTRACTION    . EYE SURGERY     cataracts  . TEE WITHOUT CARDIOVERSION N/A 10/30/2016   Procedure: TRANSESOPHAGEAL ECHOCARDIOGRAM (TEE);  Surgeon: Larey Dresser, MD;  Location: [place];  Service: Cardiovascular;  Laterality: N/A;  . TOTAL HIP ARTHROPLASTY Right 01/27/2017   Procedure: TOTAL HIP ARTHROPLASTY ANTERIOR APPROACH;  Surgeon: Rod Can, MD;  Location: [place];  Service: Orthopedics;  Laterality: Right;    There  were no vitals filed for this visit.       Subjective Assessment - 03/18/17 1054    Subjective That last tretament helped.   Patient Stated Goals Get out of pain.   Pain Score 6    Pain Location Back   Pain Orientation Right   Pain Descriptors / Indicators Aching;Throbbing   Pain Type Acute pain   Pain Onset More than a month ago                       [facility] Adult PT Treatment/Exercise - 03/18/17 0001      Modalities   Modalities Electrical Stimulation;Moist Heat;Ultrasound     Moist Heat Therapy   Number Minutes Moist Heat 15 Minutes   Moist Heat Location Lumbar Spine     Electrical Stimulation   Electrical Stimulation Action IFC   Electrical Stimulation Parameters 80-150 Hz x 15 minutes.   Electrical Stimulation Goals Pain     Ultrasound   Ultrasound Location Right low back.   Ultrasound Parameters Left sdly with pillows between knees for comfort:  Performed combo e'stim/U/S at 1.50 W/CM2 x 12 minutes to patient's right low back region.   Ultrasound Goals Pain  Tone.     Manual Therapy   Manual Therapy Soft tissue mobilization  Soft tissue mobilization STW/M to right low back erevctor spinae and QL release technique x 13 minutes f/b gentle resisted right hip extension 3 sec hold x 2.                PT Education - 03/18/17 1111    Education provided Yes   Education Details HEP.   Person(s) Educated Patient   Methods Explanation;Demonstration;Tactile cues;Verbal cues   Comprehension Verbalized understanding;Returned demonstration          PT Short Term Goals - 03/12/17 1223      PT SHORT TERM GOAL #1   Title Independent with a HEP.   Time 4   Period Weeks   Status New           PT Long Term Goals - 03/12/17 1223      PT LONG TERM GOAL #1   Title Patient stand in upright posture.   Time 8   Period Weeks   Status New     PT LONG TERM GOAL #2   Title Equal leg lengths.   Time 8   Period Weeks   Status New     PT LONG  TERM GOAL #3   Title Perform ADL's with pain not > 2-3/10.   Time 8   Period Weeks   Status New               Plan - 03/18/17 1110    Clinical Impression Statement Patient felt better after treatment and her leg lengths were equal.      Patient will benefit from skilled therapeutic intervention in order to improve the following deficits and impairments:     Visit Diagnosis: Acute right-sided low back pain without sciatica  Abnormal posture     Problem List Patient Active Problem List   Diagnosis Date Noted  . Atrial fibrillation with rapid ventricular response ([place]) 02/10/2017  . Atrial fibrillation ([place]) 02/10/2017  . Avascular necrosis of hip, right ([place]) 01/27/2017  . Avascular necrosis of bone of right hip ([place]) 01/27/2017  . Abdominal aortic atherosclerosis ([place]) 09/24/2016  . Rheumatoid arthritis ([place]) 05/07/2016  . Symptomatic bradycardia 06/02/2013  . Hypertension 04/21/2013  . Osteoporosis 04/21/2013  . Atrial fibrillation, chronic ([place]) 02/09/2013    Willam Munford, [name] MPT 03/18/2017, 11:13 AM  [facility] [place], [place], [address] Phone: [phone]   Fax:  [phone]  Name: SHEINA MCLEISH MRN: [id number] Date of Birth: 30-Jul-1932

## 2017-03-18 NOTE — Patient Instructions (Signed)
Instructed patient in draw-in exercise and correct posture in seated and standing.

## 2017-03-20 ENCOUNTER — Ambulatory Visit (INDEPENDENT_AMBULATORY_CARE_PROVIDER_SITE_OTHER): Payer: Medicare Other | Admitting: Family Medicine

## 2017-03-20 ENCOUNTER — Encounter: Payer: Self-pay | Admitting: Family Medicine

## 2017-03-20 ENCOUNTER — Other Ambulatory Visit: Payer: Self-pay | Admitting: Pharmacist

## 2017-03-20 VITALS — BP 131/89 | HR 98 | Temp 97.8°F | Ht 61.5 in | Wt 129.0 lb

## 2017-03-20 DIAGNOSIS — M48062 Spinal stenosis, lumbar region with neurogenic claudication: Secondary | ICD-10-CM | POA: Insufficient documentation

## 2017-03-20 DIAGNOSIS — M05741 Rheumatoid arthritis with rheumatoid factor of right hand without organ or systems involvement: Secondary | ICD-10-CM | POA: Diagnosis not present

## 2017-03-20 DIAGNOSIS — M05742 Rheumatoid arthritis with rheumatoid factor of left hand without organ or systems involvement: Secondary | ICD-10-CM

## 2017-03-20 DIAGNOSIS — I482 Chronic atrial fibrillation, unspecified: Secondary | ICD-10-CM

## 2017-03-20 DIAGNOSIS — E559 Vitamin D deficiency, unspecified: Secondary | ICD-10-CM

## 2017-03-20 DIAGNOSIS — I10 Essential (primary) hypertension: Secondary | ICD-10-CM | POA: Diagnosis not present

## 2017-03-20 DIAGNOSIS — K219 Gastro-esophageal reflux disease without esophagitis: Secondary | ICD-10-CM

## 2017-03-20 LAB — COAGUCHEK XS/INR WAIVED
INR: 1.9 — ABNORMAL HIGH (ref 0.9–1.1)
PROTHROMBIN TIME: 23.4 s

## 2017-03-20 MED ORDER — APIXABAN 2.5 MG PO TABS: 2.5000 mg | ORAL_TABLET | Freq: Two times a day (BID) | ORAL | 0 refills | Status: DC

## 2017-03-20 NOTE — Patient Instructions (Addendum)
Medicare Annual Wellness Visit  [facility] and the medical providers at [facility] strive to bring you the best medical care.  In doing so we not only want to address your current medical conditions and concerns but also to detect new conditions early and prevent illness, disease and health-related problems.    Medicare offers a yearly Wellness Visit which allows our clinical staff to assess your need for preventative services including immunizations, lifestyle education, counseling to decrease risk of preventable diseases and screening for fall risk and other medical concerns.    This visit is provided free of charge (no copay) for all Medicare recipients. The clinical pharmacists at [facility] have begun to conduct these Wellness Visits which will also include a thorough review of all your medications.    As you primary medical provider recommend that you make an appointment for your Annual Wellness Visit if you have not done so already this year.  You may set up this appointment before you leave today or you may call back ([phone]) and schedule an appointment.  Please make sure when you call that you mention that you are scheduling your Annual Wellness Visit with the clinical pharmacist so that the appointment may be made for the proper length of time.     Continue current medications. Continue good therapeutic lifestyle changes which include good diet and exercise. Fall precautions discussed with patient. If an FOBT was given today- please return it to our front desk. If you are over 27 years old - you may need Prevnar 42 or the adult Pneumonia vaccine.  **Flu shots are available--- please call and schedule a FLU-CLINIC appointment**  After your visit with [place] today you will receive a survey in the mail or online from [employer] regarding your care with [place]. Please take a moment to fill this out. Your feedback is very  important to [place] as you can help [place] better understand your patient needs as well as improve your experience and satisfaction. WE CARE ABOUT YOU!!!   Continue to follow-up with cardiology Continue to follow-up with rheumatology Continue to follow-up with neurosurgery and orthopedics Take x-ray results with you to the neurosurgeon and see if there's any additional recommendations that he has regarding controlling the back pain Remind him that the recent dose of prednisone by mouth did not help any with the pain.

## 2017-03-20 NOTE — Progress Notes (Signed)
Subjective:    Patient ID: Susan Fitzgerald, female    DOB: Apr 14, 1932, 83 y.o.   MRN: [id number]  HPI Pt here for follow up and management of chronic medical problems which includes a fib and hypertension. She is taking medication regularly.The patient has had a recent right hip replacement secondary to avascular necrosis. She also has degenerative disc disease of the lumbar spine and scoliosis. She is seen the neurosurgeon about this in the past. She is currently getting physical therapy but has ongoing back pain. The x-rays that she has had of her back were reviewed today. She has a chronic compression deformity involving the endplate of B16. She also has moderate to severe multifactorial right neural foraminal stenosis at L4 and 5 and L5 and S1. Is also multifactorial spinal stenosis at L2 and 3 L3 and 4 and L4 and 5. She has reasons structure only to have back pain. Upon reviewing these reports we'll give her copies of both reports. She continues to see the cardiologist regularly and she gets her pro times regularly. The patient already has an appointment from Dr. Cyndy Freeze who is also recommended by her orthopedic surgeon to follow-up with. I agree with this. She denies any chest pain or shortness of breath. She denies any trouble with nausea vomiting diarrhea blood in the stool or black tarry bowel movements. She is passing her water without problems. Most of her pain is on the right. The physical therapist has been working with this trying to alleviate this and there has been minimal improvement.     Patient Active Problem List   Diagnosis Date Noted  . Atrial fibrillation with rapid ventricular response ([place]) 02/10/2017  . Atrial fibrillation ([place]) 02/10/2017  . Avascular necrosis of hip, right ([place]) 01/27/2017  . Avascular necrosis of bone of right hip ([place]) 01/27/2017  . Abdominal aortic atherosclerosis ([place]) 09/24/2016  . Rheumatoid arthritis ([place]) 05/07/2016  . Symptomatic bradycardia  06/02/2013  . Hypertension 04/21/2013  . Osteoporosis 04/21/2013  . Atrial fibrillation, chronic ([employer]) 02/09/2013   Outpatient Encounter Prescriptions as of 03/20/2017  Medication Sig  . calcium-vitamin D (OSCAL WITH D) 250-125 MG-UNIT per tablet Take 1 tablet by mouth 2 (two) times daily.  Marland Kitchen diltiazem (CARDIZEM CD) 180 MG 24 hr capsule Take 1 capsule (180 mg total) by mouth 2 (two) times daily.  . folic acid (FOLVITE) 1 MG tablet Take 1 mg by mouth daily.  . furosemide (LASIX) 20 MG tablet Take 1 to 2 tablets by  mouth daily as needed (Patient taking differently: TAKE 20 MG DAILY)  . gabapentin (NEURONTIN) 100 MG capsule Take 1 capsule (100 mg total) by mouth at bedtime. (Patient taking differently: Take 100 mg by mouth 3 (three) times daily as needed. )  . HYDROcodone-acetaminophen (NORCO/VICODIN) 5-325 MG tablet Take 1-2 tablets by mouth every 4 (four) hours as needed (breakthrough pain).  Marland Kitchen lisinopril (PRINIVIL,ZESTRIL) 40 MG tablet TAKE 1 TABLET BY MOUTH  DAILY  . methotrexate (RHEUMATREX) 2.5 MG tablet Take 10 mg by mouth once a week.   . Multiple Vitamin (MULTIVITAMIN) capsule Take 1 capsule by mouth daily.  . pindolol (VISKEN) 10 MG tablet Take 15-20 mg by mouth See admin instructions. Take 2 tablets every morning and take 1 and 1/2 tablet at night  . potassium chloride SA (K-DUR,KLOR-CON) 20 MEQ tablet TAKE 1 TABLET BY MOUTH  DAILY  . predniSONE (DELTASONE) 10 MG tablet Take 10 mg by mouth daily with breakfast.  . warfarin (COUMADIN) 5 MG  tablet Take 0.5-1 tablets (2.5-5 mg total) by mouth daily. Takes 2.5 mg on Mondays and Fridays and 5 mg all other days of the week  . apixaban (ELIQUIS) 2.5 MG TABS tablet Take 1 tablet (2.5 mg total) by mouth 2 (two) times daily. Put on hold until patient requests. (Patient not taking: Reported on 03/20/2017)  . [DISCONTINUED] ondansetron (ZOFRAN ODT) 4 MG disintegrating tablet Take 1 tablet (4 mg total) by mouth every 8 (eight) hours as needed for nausea  or vomiting.  . [DISCONTINUED] penicillin v potassium (VEETID) 500 MG tablet Take 500 mg by mouth 4 (four) times daily.   . [DISCONTINUED] tiZANidine (ZANAFLEX) 2 MG tablet Take 1 tablet (2 mg total) by mouth every 8 (eight) hours as needed for muscle spasms.   No facility-administered encounter medications on file as of 03/20/2017.      Review of Systems  Constitutional: Negative.   HENT: Negative.   Eyes: Negative.   Respiratory: Negative.   Cardiovascular: Negative.   Gastrointestinal: Negative.   Endocrine: Negative.   Genitourinary: Negative.   Musculoskeletal: Positive for back pain (recent hip surgery).  Skin: Negative.   Allergic/Immunologic: Negative.   Neurological: Negative.   Hematological: Negative.   Psychiatric/Behavioral: Negative.        Objective:   Physical Exam  Constitutional: She is oriented to person, place, and time. She appears well-developed and well-nourished. No distress.  HENT:  Head: Normocephalic and atraumatic.  Right Ear: External ear normal.  Left Ear: External ear normal.  Nose: Nose normal.  Mouth/Throat: Oropharynx is clear and moist. No oropharyngeal exudate.  Eyes: Conjunctivae and EOM are normal. Pupils are equal, round, and reactive to light. Right eye exhibits no discharge. Left eye exhibits no discharge. No scleral icterus.  Neck: Normal range of motion. Neck supple. No thyromegaly present.  No bruits thyromegaly or anterior cervical adenopathy  Cardiovascular: Normal rate, normal heart sounds and intact distal pulses.   No murmur heard. The heart is irregular irregular at 84/m  Pulmonary/Chest: Effort normal and breath sounds normal. No respiratory distress. She has no wheezes. She has no rales.  Clear anteriorly and posteriorly  Abdominal: Soft. Bowel sounds are normal. She exhibits no mass. There is no tenderness. There is no rebound and no guarding.  No abdominal tenderness masses bruits or inguinal adenopathy  Musculoskeletal:  Normal range of motion. She exhibits no edema.  The patient moves hesitantly because of ongoing back pain and neuropathy from this.  Lymphadenopathy:    She has no cervical adenopathy.  Neurological: She is alert and oriented to person, place, and time. She has normal reflexes. No cranial nerve deficit.  Skin: Skin is warm and dry. No rash noted.  Psychiatric: She has a normal mood and affect. Her behavior is normal. Judgment and thought content normal.  Nursing note and vitals reviewed.  BP 131/89 (BP Location: Left Arm)   Pulse 98   Temp 97.8 F (36.6 C) (Oral)   Ht 5' 1.5" (1.562 m)   Wt 129 lb (58.5 kg)   BMI 23.98 kg/m         Assessment & Plan:  1. Atrial fibrillation, chronic (HCC) -Continue current treatment and follow-up with cardiology - CBC with Differential/Platelet; Future - CoaguChek XS/INR Waived  2. Essential hypertension -The blood pressure is good today and she will continue with current treatment - BMP8+EGFR; Future - CBC with Differential/Platelet; Future - Hepatic function panel; Future - NMR, lipoprofile; Future  3. Gastroesophageal reflux disease, esophagitis presence not  specified -No complaints today with reflux. - CBC with Differential/Platelet; Future - Hepatic function panel; Future  4. Vitamin D deficiency -Continue with vitamin D replacement pending results of lab work - CBC with Differential/Platelet; Future - VITAMIN D 25 Hydroxy (Vit-D Deficiency, Fractures); Future  5. Spinal stenosis of lumbar region with neurogenic claudication -Follow-up with neurosurgery as planned  6. Rheumatoid arthritis involving both hands with positive rheumatoid factor (HCC) -Continue to follow-up with rheumatology  Patient Instructions                       Medicare Annual Wellness Visit  [facility] and the medical providers at [facility] strive to bring you the best medical care.  In doing so we not only want to address your  current medical conditions and concerns but also to detect new conditions early and prevent illness, disease and health-related problems.    Medicare offers a yearly Wellness Visit which allows our clinical staff to assess your need for preventative services including immunizations, lifestyle education, counseling to decrease risk of preventable diseases and screening for fall risk and other medical concerns.    This visit is provided free of charge (no copay) for all Medicare recipients. The clinical pharmacists at [facility] have begun to conduct these Wellness Visits which will also include a thorough review of all your medications.    As you primary medical provider recommend that you make an appointment for your Annual Wellness Visit if you have not done so already this year.  You may set up this appointment before you leave today or you may call back ([phone]) and schedule an appointment.  Please make sure when you call that you mention that you are scheduling your Annual Wellness Visit with the clinical pharmacist so that the appointment may be made for the proper length of time.     Continue current medications. Continue good therapeutic lifestyle changes which include good diet and exercise. Fall precautions discussed with patient. If an FOBT was given today- please return it to our front desk. If you are over 72 years old - you may need Prevnar 82 or the adult Pneumonia vaccine.  **Flu shots are available--- please call and schedule a FLU-CLINIC appointment**  After your visit with [place] today you will receive a survey in the mail or online from [employer] regarding your care with [place]. Please take a moment to fill this out. Your feedback is very important to [place] as you can help [place] better understand your patient needs as well as improve your experience and satisfaction. WE CARE ABOUT YOU!!!   Continue to follow-up with cardiology Continue to follow-up with  rheumatology Continue to follow-up with neurosurgery and orthopedics Take x-ray results with you to the neurosurgeon and see if there's any additional recommendations that he has regarding controlling the back pain Remind him that the recent dose of prednisone by mouth did not help any with the pain.  Arrie Senate MD

## 2017-03-24 ENCOUNTER — Ambulatory Visit: Payer: Medicare Other | Admitting: Physical Therapy

## 2017-03-24 ENCOUNTER — Other Ambulatory Visit: Payer: Medicare Other

## 2017-03-24 DIAGNOSIS — N390 Urinary tract infection, site not specified: Secondary | ICD-10-CM | POA: Diagnosis not present

## 2017-03-24 DIAGNOSIS — K219 Gastro-esophageal reflux disease without esophagitis: Secondary | ICD-10-CM | POA: Diagnosis not present

## 2017-03-24 DIAGNOSIS — M545 Low back pain, unspecified: Secondary | ICD-10-CM

## 2017-03-24 DIAGNOSIS — I10 Essential (primary) hypertension: Secondary | ICD-10-CM

## 2017-03-24 DIAGNOSIS — I482 Chronic atrial fibrillation, unspecified: Secondary | ICD-10-CM

## 2017-03-24 DIAGNOSIS — R293 Abnormal posture: Secondary | ICD-10-CM

## 2017-03-24 DIAGNOSIS — E559 Vitamin D deficiency, unspecified: Secondary | ICD-10-CM | POA: Diagnosis not present

## 2017-03-24 LAB — URINALYSIS, COMPLETE
BILIRUBIN UA: NEGATIVE
Glucose, UA: NEGATIVE
NITRITE UA: NEGATIVE
Specific Gravity, UA: 1.02 (ref 1.005–1.030)
UUROB: 0.2 mg/dL (ref 0.2–1.0)
pH, UA: 7 (ref 5.0–7.5)

## 2017-03-24 LAB — MICROSCOPIC EXAMINATION: Bacteria, UA: NONE SEEN

## 2017-03-24 NOTE — Therapy (Addendum)
[facility] [place], [place], [address] Phone: [phone]   Fax:  [phone]  Physical Therapy Treatment  Patient Details  Name: Susan Fitzgerald MRN: [id number] Date of Birth: 07-14-1932 Referring Provider: Claretta Fraise MD.  Encounter Date: 03/24/2017      PT End of Session - 03/24/17 1237    Visit Number 3   Number of Visits 12   Date for PT Re-Evaluation 04/23/17   PT Start Time 0900   PT Stop Time 0950   PT Time Calculation (min) 50 min   Activity Tolerance Patient tolerated treatment well   Behavior During Therapy [facility] for tasks assessed/performed      Past Medical History:  Diagnosis Date  . Arthritis   . Bradycardia 2014  . Cataract   . Diverticulosis   . Dyspnea    periodically  . Dysrhythmia    a - fib  . Heart murmur    ??? bruit  . History of stress test    a. 04/09/13-nuclear stress-no ischemia low risk  . Hypertension   . Menopause   . Osteoporosis   . PAF (paroxysmal atrial fibrillation) ([place])    a. s/p prior DCCV; b. On flecainide & coumadin (CHA2DS2VASc = 4);  c. 03/2016 Echo: EF 55-60%, mod LVH, mod AI, mild to mod TR, PASP 24mHg.  .Marland KitchenPelvic fracture ([facility]     Past Surgical History:  Procedure Laterality Date  . BIOPSY BREAST    . BIOPSY BREAST     right & benign  . BREAST SURGERY    . CARDIOVERSION N/A 10/30/2016   Procedure: CARDIOVERSION;  Surgeon: DLarey Dresser MD;  [place]  Service: Cardiovascular;  Laterality: N/A;  . CATARACT EXTRACTION    . EYE SURGERY     cataracts  . TEE WITHOUT CARDIOVERSION N/A 10/30/2016   Procedure: TRANSESOPHAGEAL ECHOCARDIOGRAM (TEE);  Surgeon: DLarey Dresser MD;  Location: [place]  Service: Cardiovascular;  Laterality: N/A;  . TOTAL HIP ARTHROPLASTY Right 01/27/2017   Procedure: TOTAL HIP ARTHROPLASTY ANTERIOR APPROACH;  Surgeon: BRod Can MD;  [place]  Service: Orthopedics;  Laterality: Right;    There were  no vitals filed for this visit.      Subjective Assessment - 03/24/17 1233    Subjective I feel like the treatments are helpping.   Pain Score 5    Pain Location Back   Pain Orientation Right   Pain Descriptors / Indicators Aching;Throbbing   Pain Type Acute pain   Pain Onset More than a month ago                         [facility]/Exercise - 03/24/17 0001      Exercises   Exercises Knee/Hip     Moist Heat Therapy   Number Minutes Moist Heat 15 Minutes   Moist Heat Location --  Lumbar spine.     EAcupuncturistLocation --  Lumbar region.   Electrical Stimulation Parameters 80-150 Hz x 20 miinutes to right low back.   Electrical Stimulation Goals Pain     Manual Therapy   Manual Therapy Soft tissue mobilization   Soft tissue mobilization Patient in left sdly position with pillow between knees:  STW/M and right QL release technique x 23 minutes.                  PT Short Term Goals - 03/12/17  [place] #1   Title Independent with a HEP.   Time 4   Period Weeks   Status New           PT Long Term Goals - 03/12/17 1223      PT LONG TERM GOAL #1   Title Patient stand in upright posture.   Time 8   Period Weeks   Status New     PT LONG TERM GOAL #2   Title Equal leg lengths.   Time 8   Period Weeks   Status New     PT LONG TERM GOAL #3   Title Perform ADL's with pain not > 2-3/10.   Time 8   Period Weeks   Status New               Plan - 03/24/17 1238    Clinical Impression Statement Good response to treatment today and improve and posture.      Patient will benefit from skilled therapeutic intervention in order to improve the following deficits and impairments:  Abnormal gait, Decreased activity tolerance, Decreased strength, Postural dysfunction, Pain  Visit Diagnosis: Acute right-sided low back pain without sciatica  Abnormal  posture     Problem List Patient Active Problem List   Diagnosis Date Noted  . Spinal stenosis of lumbar region with neurogenic claudication 03/20/2017  . Atrial fibrillation with rapid ventricular response ([place]) 02/10/2017  . Atrial fibrillation ([place]) 02/10/2017  . Avascular necrosis of hip, right ([place]) 01/27/2017  . Avascular necrosis of bone of right hip ([place]) 01/27/2017  . Abdominal aortic atherosclerosis ([place]) 09/24/2016  . Rheumatoid arthritis ([place]) 05/07/2016  . Symptomatic bradycardia 06/02/2013  . Hypertension 04/21/2013  . Osteoporosis 04/21/2013  . Atrial fibrillation, chronic ([place]) 02/09/2013    Dunia Pringle, [name] MPT 03/24/2017, 12:39 PM  [facility] [place], [place], [address] Phone: [phone]   Fax:  [phone]  Name: Susan Fitzgerald MRN: [id number] Date of Birth: 16-Jul-1932  PHYSICAL THERAPY DISCHARGE SUMMARY  Visits from Start of Care: 3.  Current functional level related to goals / functional outcomes: See above.     Remaining deficits: See below.   Education / Equipment:  Plan: Patient agrees to discharge.  Patient goals were not met. Patient is being discharged due to not returning since the last visit.  ?????        [name] Stepheni Cameron MPT

## 2017-03-25 ENCOUNTER — Telehealth: Payer: Self-pay | Admitting: Family Medicine

## 2017-03-25 LAB — BMP8+EGFR
BUN/Creatinine Ratio: 28 (ref 12–28)
BUN: 23 mg/dL (ref 8–27)
CALCIUM: 9 mg/dL (ref 8.7–10.3)
CO2: 28 mmol/L (ref 18–29)
Chloride: 103 mmol/L (ref 96–106)
Creatinine, Ser: 0.81 mg/dL (ref 0.57–1.00)
GFR, EST AFRICAN AMERICAN: 78 mL/min/{1.73_m2} (ref >59)
GFR, EST NON AFRICAN AMERICAN: 68 mL/min/{1.73_m2} (ref >59)
Glucose: 87 mg/dL (ref 65–99)
POTASSIUM: 3.7 mmol/L (ref 3.5–5.2)
Sodium: 145 mmol/L — ABNORMAL HIGH (ref 134–144)

## 2017-03-25 LAB — NMR, LIPOPROFILE
Cholesterol: 169 mg/dL (ref 100–199)
HDL CHOLESTEROL BY NMR: 77 mg/dL (ref >39)
HDL Particle Number: 27.9 umol/L — ABNORMAL LOW (ref >=30.5)
LDL PARTICLE NUMBER: 745 nmol/L (ref <1000)
LDL Size: 21.6 nm (ref >20.5)
LDL-C: 81 mg/dL (ref 0–99)
LP-IR Score: <25 (ref <=45)
Triglycerides by NMR: 55 mg/dL (ref 0–149)

## 2017-03-25 LAB — CBC WITH DIFFERENTIAL/PLATELET
BASOS ABS: 0 10*3/uL (ref 0.0–0.2)
BASOS: 0 %
EOS (ABSOLUTE): 0.3 10*3/uL (ref 0.0–0.4)
Eos: 3 %
Hematocrit: 39.4 % (ref 34.0–46.6)
Hemoglobin: 12.4 g/dL (ref 11.1–15.9)
Immature Grans (Abs): 0 10*3/uL (ref 0.0–0.1)
Immature Granulocytes: 0 %
LYMPHS: 21 %
Lymphocytes Absolute: 1.7 10*3/uL (ref 0.7–3.1)
MCH: 30.8 pg (ref 26.6–33.0)
MCHC: 31.5 g/dL (ref 31.5–35.7)
MCV: 98 fL — AB (ref 79–97)
Monocytes Absolute: 0.9 10*3/uL (ref 0.1–0.9)
Monocytes: 11 %
NEUTROS ABS: 5.3 10*3/uL (ref 1.4–7.0)
Neutrophils: 65 %
PLATELETS: 246 10*3/uL (ref 150–379)
RBC: 4.02 x10E6/uL (ref 3.77–5.28)
RDW: 14.8 % (ref 12.3–15.4)
WBC: 8.2 10*3/uL (ref 3.4–10.8)

## 2017-03-25 LAB — HEPATIC FUNCTION PANEL
ALT: 23 IU/L (ref 0–32)
AST: 18 IU/L (ref 0–40)
Albumin: 3.5 g/dL (ref 3.5–4.7)
Alkaline Phosphatase: 106 IU/L (ref 39–117)
BILIRUBIN, DIRECT: 0.14 mg/dL (ref 0.00–0.40)
Bilirubin Total: 0.3 mg/dL (ref 0.0–1.2)
Total Protein: 6.2 g/dL (ref 6.0–8.5)

## 2017-03-25 LAB — VITAMIN D 25 HYDROXY (VIT D DEFICIENCY, FRACTURES): VIT D 25 HYDROXY: 37.9 ng/mL (ref 30.0–100.0)

## 2017-03-25 MED ORDER — ALENDRONATE SODIUM 70 MG PO TABS: 70.0000 mg | ORAL_TABLET | ORAL | 3 refills | Status: DC

## 2017-03-25 NOTE — Telephone Encounter (Signed)
Patient requests to restart alendronate.  Rx sent to [facility] Patient also asked about playing golf.  She is seeing Dr Christella Noa neurosurgeon Monday, May 14th.  I recommended that she not play golf until after that visit and only if clears by neurosurgeon.

## 2017-03-27 ENCOUNTER — Other Ambulatory Visit: Payer: Medicare Other

## 2017-03-27 DIAGNOSIS — N390 Urinary tract infection, site not specified: Secondary | ICD-10-CM

## 2017-03-28 LAB — URINE CULTURE

## 2017-03-31 DIAGNOSIS — M5416 Radiculopathy, lumbar region: Secondary | ICD-10-CM | POA: Diagnosis not present

## 2017-03-31 DIAGNOSIS — M9983 Other biomechanical lesions of lumbar region: Secondary | ICD-10-CM | POA: Diagnosis not present

## 2017-03-31 DIAGNOSIS — M4156 Other secondary scoliosis, lumbar region: Secondary | ICD-10-CM | POA: Diagnosis not present

## 2017-04-03 ENCOUNTER — Other Ambulatory Visit (HOSPITAL_COMMUNITY): Payer: Self-pay | Admitting: Neurosurgery

## 2017-04-03 ENCOUNTER — Other Ambulatory Visit: Payer: Self-pay | Admitting: Neurosurgery

## 2017-04-03 ENCOUNTER — Telehealth: Payer: Self-pay | Admitting: Family Medicine

## 2017-04-03 DIAGNOSIS — M4156 Other secondary scoliosis, lumbar region: Secondary | ICD-10-CM

## 2017-04-04 NOTE — Telephone Encounter (Signed)
Patient has questions about if there was any blood work to see if Eliquis is working.  Discussed that with Eliquis there is not testing except we will check CBC about every 6 months or if she was having any s/s of bleeding.  Pt just has CBC 03/24/17 and was normal.

## 2017-04-09 ENCOUNTER — Ambulatory Visit (HOSPITAL_COMMUNITY)
Admission: RE | Admit: 2017-04-09 | Discharge: 2017-04-09 | Disposition: A | Discharge status: Home / Self Care | Payer: Medicare Other | Source: Ambulatory Visit | Attending: Neurosurgery | Admitting: Neurosurgery

## 2017-04-09 DIAGNOSIS — M545 Low back pain: Secondary | ICD-10-CM | POA: Diagnosis not present

## 2017-04-09 DIAGNOSIS — M4186 Other forms of scoliosis, lumbar region: Secondary | ICD-10-CM | POA: Insufficient documentation

## 2017-04-09 DIAGNOSIS — M4156 Other secondary scoliosis, lumbar region: Secondary | ICD-10-CM

## 2017-04-09 DIAGNOSIS — M4807 Spinal stenosis, lumbosacral region: Secondary | ICD-10-CM | POA: Diagnosis not present

## 2017-04-09 MED ORDER — LIDOCAINE HCL 1 % IJ SOLN
INTRAMUSCULAR | Status: AC
  Filled 2017-04-09: qty 10

## 2017-04-09 MED ORDER — DIAZEPAM 5 MG PO TABS
ORAL_TABLET | ORAL | Status: AC
  Filled 2017-04-09: qty 2

## 2017-04-09 MED ORDER — IOPAMIDOL (ISOVUE-M 200) INJECTION 41%
INTRAMUSCULAR | Status: AC
  Administered 2017-04-09: 20 mL via INTRATHECAL
  Filled 2017-04-09: qty 10

## 2017-04-09 MED ORDER — LIDOCAINE HCL (PF) 1 % IJ SOLN
5.0000 mL | Freq: Once | INTRAMUSCULAR | Status: AC
  Administered 2017-04-09: 5 mL via INTRADERMAL

## 2017-04-09 MED ORDER — IOPAMIDOL (ISOVUE-M 200) INJECTION 41%
20.0000 mL | Freq: Once | INTRAMUSCULAR | Status: AC
  Administered 2017-04-09: 20 mL via INTRATHECAL

## 2017-04-09 MED ORDER — DIAZEPAM 5 MG PO TABS
10.0000 mg | ORAL_TABLET | Freq: Once | ORAL | Status: AC
  Administered 2017-04-09: 10 mg via ORAL

## 2017-04-09 MED ORDER — ONDANSETRON HCL 4 MG/2ML IJ SOLN: 4.0000 mg | Freq: Four times a day (QID) | INTRAMUSCULAR | Status: DC | PRN

## 2017-04-09 NOTE — Op Note (Signed)
04/09/2017 lumbar Myelogram  PATIENT:  Susan Fitzgerald is a 83 y.o. female with low back and lower extremity pain here today for a lumbar myelogram  PRE-OPERATIVE DIAGNOSIS:  Lumbago  POST-OPERATIVE DIAGNOSIS:  Lumbago, scoliosis  PROCEDURE:  Lumbar Myelogram  SURGEON:  Sherica Paternostro  ANESTHESIA:   local LOCAL MEDICATIONS USED:  LIDOCAINE  and Amount: 9 ml Procedure Note: [name] is a 83 y.o. female Was taken to the fluoroscopy suite and  positioned prone on the fluoroscopy table. Her back was prepared and draped in a sterile manner. I infiltrated 9 cc into the lumbar region. I then introduced a spinal needle into the thecal sac at the L3/4 interlaminar space. I infiltrated 20cc of Isovue 200 into the thecal sac. Fluoroscopy showed the needle and contrast in the thecal sac. Havannah S Deridder tolerated the procedure well. she Will be taken to CT for evaluation.     PATIENT DISPOSITION:  PACU - hemodynamically stable.

## 2017-04-09 NOTE — Discharge Instructions (Signed)
Myelogram, Care After °These instructions give you information about caring for yourself after your procedure. Your doctor may also give you more specific instructions. Call your doctor if you have any problems or questions after your procedure. °Follow these instructions at home: °· Drink enough fluid to keep your pee (urine) clear or pale yellow. °· Rest as told by your doctor. °· Lie flat with your head slightly raised (elevated). °· Do not bend, lift, or do any hard activities for 24-48 hours or as told by your doctor. °· Take over-the-counter and prescription medicines only as told by your doctor. °· Take care of and remove your bandage (dressing) as told by your doctor. °· Bathe or shower as told by your doctor. °Contact a health care provider if: °· You have a fever. °· You have a headache that lasts longer than 24 hours. °· You feel sick to your stomach (nauseous). °· You throw up (vomit). °· Your neck is stiff. °· Your legs feel numb. °· You cannot pee. °· You cannot poop (have a bowel movement). °· You have a rash. °· You are itchy or sneezing. °Get help right away if: °· You have new symptoms or your symptoms get worse. °· You have a seizure. °· You have trouble breathing. °This information is not intended to replace advice given to you by your health care provider. Make sure you discuss any questions you have with your health care provider. °Document Released: 08/13/2008 Document Revised: 07/04/2016 Document Reviewed: 08/17/2015 °Elsevier Interactive Patient Education © 2017 [employer]. ° °

## 2017-04-09 NOTE — Progress Notes (Signed)
Per Dr Christella Noa ok to resume eliquis tomorrow.

## 2017-04-15 DIAGNOSIS — M4726 Other spondylosis with radiculopathy, lumbar region: Secondary | ICD-10-CM | POA: Diagnosis not present

## 2017-04-16 ENCOUNTER — Telehealth: Payer: Self-pay | Admitting: Family Medicine

## 2017-04-18 NOTE — Telephone Encounter (Signed)
I have not received any paperwork yet requesting recommendations for holding anticoagulation therapy / Eliquis prior to procedure.  [facility] to request either fax or information regarding upcoming procedure for Susan Fitzgerald - left message for surgery / procedure scheduler Nickie.  Patient aware that we have contacted [facility].

## 2017-04-21 ENCOUNTER — Telehealth: Payer: Self-pay | Admitting: Pharmacist

## 2017-04-21 NOTE — Telephone Encounter (Signed)
Still awaiting contact from Dr Lauris Poag office regarding procedure and holding warfarin.  Patient just talk to their office today.  If I do not receive communication by tomorrow then will call them back.

## 2017-04-22 NOTE — Telephone Encounter (Signed)
Correction to yesterday's note - patient is taking Eliquis.  I called and left message with Dr Lauris Poag nurse to find out what type of procedure they are planning for Mrs. Susan Fitzgerald.  Had to leave message.

## 2017-04-25 NOTE — Addendum Note (Signed)
Addendum  created 04/25/17 1050 by Lyn Hollingshead, MD   Sign clinical note

## 2017-04-28 ENCOUNTER — Ambulatory Visit (HOSPITAL_COMMUNITY)
Admission: RE | Admit: 2017-04-28 | Discharge: 2017-04-28 | Disposition: A | Discharge status: Home / Self Care | Payer: Medicare Other | Source: Ambulatory Visit | Attending: Family Medicine | Admitting: Family Medicine

## 2017-04-28 ENCOUNTER — Ambulatory Visit (INDEPENDENT_AMBULATORY_CARE_PROVIDER_SITE_OTHER): Payer: Medicare Other | Admitting: Family Medicine

## 2017-04-28 ENCOUNTER — Encounter: Payer: Self-pay | Admitting: Family Medicine

## 2017-04-28 VITALS — BP 128/83 | HR 76 | Temp 98.3°F | Ht 61.0 in | Wt 128.0 lb

## 2017-04-28 DIAGNOSIS — J90 Pleural effusion, not elsewhere classified: Secondary | ICD-10-CM | POA: Diagnosis not present

## 2017-04-28 DIAGNOSIS — K769 Liver disease, unspecified: Secondary | ICD-10-CM | POA: Insufficient documentation

## 2017-04-28 DIAGNOSIS — R1011 Right upper quadrant pain: Secondary | ICD-10-CM

## 2017-04-28 DIAGNOSIS — R1010 Upper abdominal pain, unspecified: Secondary | ICD-10-CM | POA: Diagnosis not present

## 2017-04-28 NOTE — Progress Notes (Signed)
BP 128/83   Pulse 76   Temp 98.3 F (36.8 C) (Oral)   Ht 5\' 1"  (1.549 m)   Wt 128 lb (58.1 kg)   BMI 24.19 kg/m    Subjective:    Patient ID: Susan Fitzgerald, female    DOB: 04/24/1932, 83 y.o.   MRN: [id number]  HPI: Susan Fitzgerald is a 83 y.o. female presenting on 04/28/2017 for Abdominal Pain (RUQ pain ongoing for several months, )   HPI Right upper quadrant abdominal pain Patient comes in today with complaints of right upper quadrant abdominal pain that's been ongoing for several months now. She had a hip replacement surgery and ever since then she's been having some issues intermittently with right upper quadrant abdominal pain. She feels like it's on that right side just under her ribs. She has not noticed any association with eating or not eating. She has not noticed any association with constipation or diarrhea. She denies any fevers or chills or nausea or vomiting. Her appetite has been decreased. She says that sometimes rotation of her upper body does cause a little bit of pain there and she has been fighting some back pains around on that area as well and does not know if it's correlated that or not. She is seeing somebody with orthopedics for her back and they are working with her and she has done physical therapy for her as well.  Relevant past medical, surgical, family and social history reviewed and updated as indicated. Interim medical history since our last visit reviewed. Allergies and medications reviewed and updated.  Review of Systems  Constitutional: Negative for chills and fever.  Eyes: Negative for redness and visual disturbance.  Respiratory: Negative for chest tightness and shortness of breath.   Cardiovascular: Negative for chest pain and leg swelling.  Gastrointestinal: Positive for abdominal pain. Negative for blood in stool, constipation, diarrhea, nausea and vomiting.  Genitourinary: Negative for difficulty urinating, dysuria, flank pain, frequency,  hematuria, vaginal bleeding, vaginal discharge and vaginal pain.  Musculoskeletal: Positive for back pain. Negative for gait problem.  Skin: Negative for rash.  Neurological: Negative for light-headedness and headaches.  Psychiatric/Behavioral: Negative for agitation and behavioral problems.  All other systems reviewed and are negative.   Per HPI unless specifically indicated above        Objective:    BP 128/83   Pulse 76   Temp 98.3 F (36.8 C) (Oral)   Ht 5\' 1"  (1.549 m)   Wt 128 lb (58.1 kg)   BMI 24.19 kg/m   Wt Readings from Last 3 Encounters:  04/28/17 128 lb (58.1 kg)  04/09/17 126 lb (57.2 kg)  03/20/17 129 lb (58.5 kg)    Physical Exam  Constitutional: She is oriented to person, place, and time. She appears well-developed and well-nourished. No distress.  Eyes: Conjunctivae are normal.  Neck: Neck supple. No thyromegaly present.  Cardiovascular: Normal rate, regular rhythm, normal heart sounds and intact distal pulses.   No murmur heard. Pulmonary/Chest: Effort normal and breath sounds normal. No respiratory distress. She has no wheezes. She has no rales.  Abdominal: Soft. Bowel sounds are normal. She exhibits no distension. There is no hepatosplenomegaly. There is tenderness (Right upper quadrant abdominal tenderness and right lower rib tenderness) in the right upper quadrant. There is no rigidity, no rebound, no guarding and no CVA tenderness.  Musculoskeletal: Normal range of motion. She exhibits no edema or tenderness.  Lymphadenopathy:    She has no cervical adenopathy.  Neurological: She is alert and oriented to person, place, and time. Coordination normal.  Skin: Skin is warm and dry. No rash noted. She is not diaphoretic.  Psychiatric: She has a normal mood and affect. Her behavior is normal.  Nursing note and vitals reviewed.     Assessment & Plan:   Problem List Items Addressed This Visit    None    Visit Diagnoses    Right upper quadrant  abdominal pain    -  Primary   Will rule out gallbladder disease, possibly could be costochondritis, recommended muscle relaxer and heating pad and stretching   Relevant Orders   US Abdomen Limited RUQ      Sending for gallbladder ultrasound and if ultrasound is normal, recommended treating it like costochondritis  Follow up plan: Return if symptoms worsen or fail to improve.  Counseling provided for all of the vaccine components Orders Placed This Encounter  Procedures  . US Abdomen Limited RUQ    Allea Kassner, MD [facility] 04/28/2017, 12:01 PM

## 2017-04-28 NOTE — Telephone Encounter (Signed)
Left message on VM of Susan Fitzgerald, CMA for Dr Brien Few.  Requested information for patient's upccoming procedure so we can advise on if necessary to hold anticoagulant / Eliquis.

## 2017-04-29 DIAGNOSIS — M47816 Spondylosis without myelopathy or radiculopathy, lumbar region: Secondary | ICD-10-CM | POA: Diagnosis not present

## 2017-05-02 ENCOUNTER — Telehealth: Payer: Self-pay | Admitting: Family Medicine

## 2017-05-02 NOTE — Telephone Encounter (Signed)
Pt notified of recommendation Verbalizes understanding 

## 2017-05-02 NOTE — Telephone Encounter (Signed)
No need to stop Eliquis for tooth extraction.

## 2017-05-14 ENCOUNTER — Encounter: Payer: Self-pay | Admitting: Physician Assistant

## 2017-05-14 ENCOUNTER — Ambulatory Visit (INDEPENDENT_AMBULATORY_CARE_PROVIDER_SITE_OTHER): Payer: Medicare Other | Admitting: Physician Assistant

## 2017-05-14 ENCOUNTER — Encounter: Payer: Medicare Other | Admitting: *Deleted

## 2017-05-14 VITALS — BP 127/82 | HR 89 | Temp 97.6°F | Ht 61.0 in | Wt 129.0 lb

## 2017-05-14 DIAGNOSIS — L309 Dermatitis, unspecified: Secondary | ICD-10-CM

## 2017-05-14 DIAGNOSIS — Z1231 Encounter for screening mammogram for malignant neoplasm of breast: Secondary | ICD-10-CM | POA: Diagnosis not present

## 2017-05-14 MED ORDER — VALACYCLOVIR HCL 1 G PO TABS: 1000.0000 mg | ORAL_TABLET | Freq: Three times a day (TID) | ORAL | 0 refills | Status: DC

## 2017-05-14 NOTE — Patient Instructions (Signed)
Shingles Shingles, which is also known as herpes zoster, is an infection that causes a painful skin rash and fluid-filled blisters. Shingles is not related to genital herpes, which is a sexually transmitted infection. Shingles only develops in people who:  Have had chickenpox.  Have received the chickenpox vaccine. (This is rare.)  What are the causes? Shingles is caused by varicella-zoster virus (VZV). This is the same virus that causes chickenpox. After exposure to VZV, the virus stays in the body in an inactive (dormant) state. Shingles develops if the virus reactivates. This can happen many years after the initial exposure to VZV. It is not known what causes this virus to reactivate. What increases the risk? People who have had chickenpox or received the chickenpox vaccine are at risk for shingles. Infection is more common in people who:  Are older than age 50.  Have a weakened defense (immune) system, such as those with HIV, AIDS, or cancer.  Are taking medicines that weaken the immune system, such as transplant medicines.  Are under great stress.  What are the signs or symptoms? Early symptoms of this condition include itching, tingling, and pain in an area on your skin. Pain may be described as burning, stabbing, or throbbing. A few days or weeks after symptoms start, a painful red rash appears, usually on one side of the body in a bandlike or beltlike pattern. The rash eventually turns into fluid-filled blisters that break open, scab over, and dry up in about 2-3 weeks. At any time during the infection, you may also develop:  A fever.  Chills.  A headache.  An upset stomach.  How is this diagnosed? This condition is diagnosed with a skin exam. Sometimes, skin or fluid samples are taken from the blisters before a diagnosis is made. These samples are examined under a microscope or sent to a lab for testing. How is this treated? There is no specific cure for this condition.  Your health care provider will probably prescribe medicines to help you manage pain, recover more quickly, and avoid long-term problems. Medicines may include:  Antiviral drugs.  Anti-inflammatory drugs.  Pain medicines.  If the area involved is on your face, you may be referred to a specialist, such as an eye doctor (ophthalmologist) or an ear, nose, and throat (ENT) doctor to help you avoid eye problems, chronic pain, or disability. Follow these instructions at home: Medicines  Take medicines only as directed by your health care provider.  Apply an anti-itch or numbing cream to the affected area as directed by your health care provider. Blister and Rash Care  Take a cool bath or apply cool compresses to the area of the rash or blisters as directed by your health care provider. This may help with pain and itching.  Keep your rash covered with a loose bandage (dressing). Wear loose-fitting clothing to help ease the pain of material rubbing against the rash.  Keep your rash and blisters clean with mild soap and cool water or as directed by your health care provider.  Check your rash every day for signs of infection. These include redness, swelling, and pain that lasts or increases.  Do not pick your blisters.  Do not scratch your rash. General instructions  Rest as directed by your health care provider.  Keep all follow-up visits as directed by your health care provider. This is important.  Until your blisters scab over, your infection can cause chickenpox in people who have never had it or been vaccinated   against it. To prevent this from happening, avoid contact with other people, especially: ? Babies. ? Pregnant women. ? Children who have eczema. ? Elderly people who have transplants. ? People who have chronic illnesses, such as leukemia or AIDS. Contact a health care provider if:  Your pain is not relieved with prescribed medicines.  Your pain does not get better after  the rash heals.  Your rash looks infected. Signs of infection include redness, swelling, and pain that lasts or increases. Get help right away if:  The rash is on your face or nose.  You have facial pain, pain around your eye area, or loss of feeling on one side of your face.  You have ear pain or you have ringing in your ear.  You have loss of taste.  Your condition gets worse. This information is not intended to replace advice given to you by your health care provider. Make sure you discuss any questions you have with your health care provider. Document Released: 11/04/2005 Document Revised: 06/30/2016 Document Reviewed: 09/15/2014 Elsevier Interactive Patient Education  2017 Elsevier Inc.  

## 2017-05-14 NOTE — Progress Notes (Signed)
Subjective:     Patient ID: Susan Fitzgerald, female   DOB: Apr 05, 1932, 83 y.o.   MRN: [id number]  HPI Rash to the L upper forehead region x 1-2 days Hx of  shingles to the forehead many years ago  Review of Systems Denies any pain or pruritus to the area No drainage from the site    Objective:   Physical Exam  Constitutional: She appears well-developed and well-nourished.  Nursing note and vitals reviewed.  + cluster or erythem lesions to the L upper forehead area No current vesicles or ulcerations seen No cerv nodes noted    Assessment:     1. Dermatitis        Plan:     Will cover with Valtrex 1 gm tid x 1 week Hold any hydrocort use Transmission precaut reviewed F/U prn

## 2017-05-19 ENCOUNTER — Ambulatory Visit (HOSPITAL_COMMUNITY): Payer: Medicare Other | Admitting: Nurse Practitioner

## 2017-05-22 ENCOUNTER — Other Ambulatory Visit: Payer: Self-pay | Admitting: Family Medicine

## 2017-05-27 DIAGNOSIS — M4726 Other spondylosis with radiculopathy, lumbar region: Secondary | ICD-10-CM | POA: Diagnosis not present

## 2017-06-02 DIAGNOSIS — M7989 Other specified soft tissue disorders: Secondary | ICD-10-CM | POA: Diagnosis not present

## 2017-06-02 DIAGNOSIS — M150 Primary generalized (osteo)arthritis: Secondary | ICD-10-CM | POA: Diagnosis not present

## 2017-06-02 DIAGNOSIS — M5030 Other cervical disc degeneration, unspecified cervical region: Secondary | ICD-10-CM | POA: Diagnosis not present

## 2017-06-02 DIAGNOSIS — R7989 Other specified abnormal findings of blood chemistry: Secondary | ICD-10-CM | POA: Diagnosis not present

## 2017-06-02 DIAGNOSIS — M5136 Other intervertebral disc degeneration, lumbar region: Secondary | ICD-10-CM | POA: Diagnosis not present

## 2017-06-02 DIAGNOSIS — M0579 Rheumatoid arthritis with rheumatoid factor of multiple sites without organ or systems involvement: Secondary | ICD-10-CM | POA: Diagnosis not present

## 2017-06-09 ENCOUNTER — Encounter (HOSPITAL_COMMUNITY): Payer: Self-pay | Admitting: Nurse Practitioner

## 2017-06-09 ENCOUNTER — Ambulatory Visit (HOSPITAL_COMMUNITY)
Admission: RE | Admit: 2017-06-09 | Discharge: 2017-06-09 | Disposition: A | Discharge status: Home / Self Care | Payer: Medicare Other | Source: Ambulatory Visit | Attending: Nurse Practitioner | Admitting: Nurse Practitioner

## 2017-06-09 VITALS — BP 132/74 | HR 84 | Ht 61.0 in | Wt 130.2 lb

## 2017-06-09 DIAGNOSIS — I4581 Long QT syndrome: Secondary | ICD-10-CM | POA: Insufficient documentation

## 2017-06-09 DIAGNOSIS — I4819 Other persistent atrial fibrillation: Secondary | ICD-10-CM

## 2017-06-09 DIAGNOSIS — I480 Paroxysmal atrial fibrillation: Secondary | ICD-10-CM | POA: Insufficient documentation

## 2017-06-09 DIAGNOSIS — R011 Cardiac murmur, unspecified: Secondary | ICD-10-CM | POA: Diagnosis not present

## 2017-06-09 DIAGNOSIS — Z7901 Long term (current) use of anticoagulants: Secondary | ICD-10-CM | POA: Insufficient documentation

## 2017-06-09 DIAGNOSIS — Z96641 Presence of right artificial hip joint: Secondary | ICD-10-CM | POA: Insufficient documentation

## 2017-06-09 DIAGNOSIS — I481 Persistent atrial fibrillation: Secondary | ICD-10-CM | POA: Diagnosis not present

## 2017-06-09 DIAGNOSIS — Z780 Asymptomatic menopausal state: Secondary | ICD-10-CM | POA: Diagnosis not present

## 2017-06-09 DIAGNOSIS — I10 Essential (primary) hypertension: Secondary | ICD-10-CM | POA: Insufficient documentation

## 2017-06-09 MED ORDER — DILTIAZEM HCL ER COATED BEADS 180 MG PO CP24: 180.0000 mg | ORAL_CAPSULE | Freq: Every day | ORAL | 2 refills | Status: DC

## 2017-06-09 NOTE — Patient Instructions (Signed)
Your physician has recommended you make the following change in your medication:  1)Decrease cardizem to once a day

## 2017-06-09 NOTE — Progress Notes (Signed)
Primary Care Physician: Chipper Herb, MD Referring Physician: Ignacia Bayley, NP Cardiologist: Dr. Neomia Dear Susan Fitzgerald is a 83 y.o. female with a h/o paroxysmal afib that is in the afib clinic for evaluation for recent increase in afib burden. She has  history of atrial fibrillation status post prior cardioversion and subsequent antiarrhythmic therapy with flecainide. She is anticoagulated with Coumadin. She also takes pindolol. Over the years, she has had occasional paroxysms of short-lived A. fib. Earlier this year, she had a period of atrial fibrillation, which is usually associated with dyspnea when it occurs, and she took an additional pindolol and this subsequently broke. She was doing well when she last saw Dr. Percival Spanish in June. Since early September however, she has noted more frequent elevated heart rates and irregular heart rhythm. When in A. fib, rates are typically in the mid 90s to low 100s. She records her blood pressures and heart rates on a regular basis and has shown these to me today. Over the past 2 months, she is mostly in the 80s to 90s though occasionally will have rates in the 70s and 60s. She does not think that she has been in atrial fibrillation all of the time over the past 2 months. As above, she does have dyspnea on exertion when in A. fib but denies chest pain, PND, orthopnea, dizziness, syncope, or early satiety. She does have some degree of mild chronic venous stasis and lower extremity swelling and recently taken off amlodipine.  On visit with me, she feels that she has been in afib consistently over the last two weeks. I reviewed her V/S and her heart rate has been elevated closer to 100 bpm. In SR, she has a heart rate closer to 60. Discussion with pt for cardioversion with continuation on flecainide or flecainide washout and change to tikosyn. Pt is also planning to have an epidural Tuesday and will have to stop coumadin tonight. She wants to pursue this prior  to any change in afib management. It was also discussed that coumadin will have to be therapeutic x 3-4 weeks prior to any change in therapy.  Pt returns to afib clinic 11/30, s/p epidural for back pain for which pt had to stop warfarin and as of yet, has become therapeutic since restarting drug. The injection was not that helpful and pt now thinks she may need a hip replacement after the first of the year. Options of  restoring SR were discussed again and she would like to proceed with cardioversion with flecainide to see if flecainide will continue to work for pt. She has had good success with this drug over the years and is not that willing to move on to other options yet. She is wanting to do this as soon as possible so she can then plan for hip surgery. She is aware that she cannot interrupt blood thinner for at least 30 days after cardioversion. If that fails, she would next be interested in [name], if she can afford drug.  She is aware that she will require TEE guided cardioversion since she will not be therapeutic 4 weeks prior to cardioversion.  Returns to afib clinic one week s/p cardioversion. She is in SR but is still having exertional dyspnea. She was hoping that return to SR would improve this. EKG shows more pronounced T waves in the ant/inf leads. She denies exertional chest pain. Her BP was elevated at time of cardioversion and she received IV meds. At  home, it continues to be elevated. It was around 353 systolic on arrival, rechecked at 180/80. She was on 2.5 mg of amlodipine which was stopped in November for mild ankle swelling. She continues to have mild ankle swelling off drug and occasionally worse in rt leg which she will be seeing a orthopedic surgeon re today and may be pending surgery. Gabapentin was added since last visit but pt has not noted too much change in leg discomfort. Qtc is also prolonged today, unsure why, previous ekg's reviewed and qtc was in range. No other new meds to  explain change. She does use prn lasix. BP rechecked at 180/80. Echo in May showed moderate aortic regurg, normal EF with moderate diastolic dysfunction, pulmonary pressure increased at 53 mm Hg.  Return to afib clinic 1/12. She saw Dr. Percival Spanish for her ongoing shortness of breath and needing cardiac clearance for her desired back/hip sugery. However, she was not well rate controlled having stopped flecainide for prolonged QTc. She is wanting surgery so will try to rate control to get her thru surgery and address restoring SR at a later date. Dr.Hochrein started cardizem 30 mg tid which has helped control her heart rate but has contributed to swelling of her left leg, which is the one that is needing surgery. Review of her HR's at [place] indicate that majority of HR is now upper 80's to 90's, occasion low 100's, and would like to see HR's slower in the low 80's's to be in the best shape for surgery. Increased pindolol to 20 mg am and continued 15 mg pm.  She returns today,1/19 with afib poorly controlled, v rates in the 120's, despite increasing BB and CCB recently added. She continues to have LLE which is chronic but slightly more with addition of Cardizem. Review of her history shows  she was on amiodarone at one time but was stopped due to intolerance, possibly due to elevated liver enzymes. Her renal function is calculated  at 40 ml/min, which would probably limit dose of  Tikosyn to 125 mcg to possible 250 mcg bid. She really does not want to come into the hospital. Flecainide was recently stopped due to prolonged qtc and this would be a concern with tikosyn . Sotalol would also be a renal concern since it is contraindicated with renal cl less than 40 ml/min. As persisitent as her afib has been, I do not think that multaq would be effective. She really wants to achieve rate control and be able to have her surgery.  F/u in the afib clinic for a recent ER visit for afib with RVR. Pt recently had her rt hip  replaced and did well during surgery with afib staying rate controlled. However, she has had some issues with ambulating weakness/ pain since being home and last Sunday started having a lot of abdominal discomfort and noted afib with rvr. She was told to go to the ER where her HR was around 150 bpm. She was started on IV dilt and then transitioned to po cardizem. She went home on 360 mg cardizem.  Pt then called a few days after d/c and was c/o of a very low BP at 80 systolic and weakness after taking her am Cardizem dose. HR was controlled. She was advised not to take any more meds that day that would affect BP and after that day to divide the cardizem dose to 180 mg bid.  Since then, BP/HR at home have been better controlled. She is still very  weak with surgery and disappointed that her ambulation is not progressing very quickly.  F/u in afib clinic, 7/23. She remains in rate ocntrolled afib. Has regained a lot of her strength from hip surgery and walked in clinic today vrs riding in a w/c. She is back to playing golf. Again asks today re options to get back in SR. She also has LLE possibly 2/2 cardizem.  Today, she denies symptoms of  chest pain,  orthopnea, PND,  dizziness, presyncope, syncope, or neurologic sequela.   Positive for exertional shortness of breath, LEE left worse than rt, rt leg discomfort. The patient is tolerating medications without difficulties and is otherwise without complaint today.   Past Medical History:  Diagnosis Date  . Arthritis   . Bradycardia 2014  . Cataract   . Diverticulosis   . Dyspnea    periodically  . Dysrhythmia    a - fib  . Heart murmur    ??? bruit  . History of stress test    a. 04/09/13-nuclear stress-no ischemia low risk  . Hypertension   . Menopause   . Osteoporosis   . PAF (paroxysmal atrial fibrillation) ([place])    a. s/p prior DCCV; b. On flecainide & coumadin (CHA2DS2VASc = 4);  c. 03/2016 Echo: EF 55-60%, mod LVH, mod AI, mild to mod TR,  PASP 66mmHg.  Marland Kitchen Pelvic fracture [facility])    Past Surgical History:  Procedure Laterality Date  . BIOPSY BREAST    . BIOPSY BREAST     right & benign  . BREAST SURGERY    . CARDIOVERSION N/A 10/30/2016   Procedure: CARDIOVERSION;  Surgeon: Larey Dresser, MD;  Location: [place];  Service: Cardiovascular;  Laterality: N/A;  . CATARACT EXTRACTION    . EYE SURGERY     cataracts  . TEE WITHOUT CARDIOVERSION N/A 10/30/2016   Procedure: TRANSESOPHAGEAL ECHOCARDIOGRAM (TEE);  Surgeon: Larey Dresser, MD;  Location: [place];  Service: Cardiovascular;  Laterality: N/A;  . TOTAL HIP ARTHROPLASTY Right 01/27/2017   Procedure: TOTAL HIP ARTHROPLASTY ANTERIOR APPROACH;  Surgeon: Rod Can, MD;  Location: [place];  Service: Orthopedics;  Laterality: Right;    Current Outpatient Prescriptions  Medication Sig Dispense Refill  . calcium-vitamin D (OSCAL WITH D) 250-125 MG-UNIT per tablet Take 1 tablet by mouth 2 (two) times daily.    Marland Kitchen diltiazem (CARDIZEM CD) 180 MG 24 hr capsule Take 1 capsule (180 mg total) by mouth daily. 180 capsule 2  . ELIQUIS 2.5 MG TABS tablet TAKE 1 TABLET BY MOUTH 2  TIMES DAILY. PUT ON HOLD  UNTIL PATIENT REQUESTS. 025 tablet 1  . folic acid (FOLVITE) 1 MG tablet Take 1 mg by mouth daily.    . furosemide (LASIX) 20 MG tablet Take 1 to 2 tablets by  mouth daily as needed (Patient taking differently: TAKE 20 MG DAILY) 180 tablet 1  . gabapentin (NEURONTIN) 100 MG capsule Take 1 capsule (100 mg total) by mouth at bedtime. (Patient taking differently: Take 100 mg by mouth 3 (three) times daily as needed. ) 90 capsule 1  . lisinopril (PRINIVIL,ZESTRIL) 40 MG tablet TAKE 1 TABLET BY MOUTH  DAILY 90 tablet 1  . methotrexate (RHEUMATREX) 2.5 MG tablet Take 10 mg by mouth once a week.     . Multiple Vitamin (MULTIVITAMIN) capsule Take 1 capsule by mouth daily.    . pindolol (VISKEN) 10 MG tablet Take 15-20 mg by mouth See admin instructions. Take 2 tablets every morning and  take  1 and 1/2 tablet at night    . potassium chloride SA (K-DUR,KLOR-CON) 20 MEQ tablet TAKE 1 TABLET BY MOUTH  DAILY 90 tablet 1   No current facility-administered medications for this encounter.     Allergies  Allergen Reactions  . Benazepril Hcl Cough    Social History   Social History  . Marital status: Married    Spouse name: N/A  . Number of children: N/A  . Years of education: N/A   Occupational History  . Not on file.   Social History Main Topics  . Smoking status: Never Smoker  . Smokeless tobacco: Never Used  . Alcohol use No  . Drug use: No  . Sexual activity: Yes   Other Topics Concern  . Not on file   Social History Narrative  . No narrative on file    Family History  Problem Relation Age of Onset  . Stroke Mother        cerebral hemorrhage  . Heart disease Father        MI  . Heart attack Father   . Vision loss Father   . Heart disease Brother   . Heart attack Brother   . Cancer Maternal Aunt        breast  . Cancer Brother   . Heart disease Brother   . Cancer Daughter        breast    ROS- All systems are reviewed and negative except as per the HPI above  Physical Exam: Vitals:   06/09/17 1028  BP: 132/74  Pulse: 84  Weight: 130 lb 3.2 oz (59.1 kg)  Height: 5\' 1"  (1.549 m)    GEN- The patient is well appearing, alert and oriented x 3 today.   Head- normocephalic, atraumatic Eyes-  Sclera clear, conjunctiva pink Ears- hearing intact Oropharynx- clear Neck- supple, no JVP Lymph- no cervical lymphadenopathy Lungs- Clear to ausculation bilaterally, normal work of breathing Heart-  irregular rate and rhythm, no murmurs, rubs or gallops, PMI not laterally displaced GI- soft, NT, ND, + BS Extremities- no clubbing, cyanosis, or mild LLE, rt greater than left, with compression socks on. MS- no significant deformity or atrophy Skin- no rash or lesion Psych- euthymic mood, full affect Neuro- strength and sensation are intact  EKG-  afib at 84 bpm, qrs int 86 ms, qtc 430 ms Epic records reviewed Echo- 03/18/16 LV EF: 55% -   60%  ------------------------------------------------------------------- Indications:      SOB (R06.00).  ------------------------------------------------------------------- Study Conclusions  - Left ventricle: The cavity size was normal. Wall thickness was   increased in a pattern of moderate LVH. Systolic function was   normal. The estimated ejection fraction was in the range of 55%   to 60%. - Aortic valve: There was moderate regurgitation. - Tricuspid valve: There was mild-moderate regurgitation. - Pulmonary arteries: Systolic pressure was moderately increased.  Assessment and Plan: 1. Persistent afib  Rate controlled and is feeling better since  hip surgery Reduce cardizem to 180 mg qd to help reduce LLE Continue pindolol  Continue warfarin for chadsvasc score of at least 4 Again discussed tikoyn, amiodarone and sotalol as possible means to restore SR Pt will check on price of drug, consider options and get back to me. Her crcl cal is 48.99, so she would only qualify for daily sotalol and or 250 mcg of tikosyn bid based on current weight age creatinine.  2. HTN Stable  3. QTC prolongation Has returned to normal range off  flecainide  She will let me know how she wants to progress   Butch Penny C. Gertrue Willette, [facility] [address] [place], [place] [address] [phone]

## 2017-06-16 DIAGNOSIS — D1801 Hemangioma of skin and subcutaneous tissue: Secondary | ICD-10-CM | POA: Diagnosis not present

## 2017-06-16 DIAGNOSIS — L814 Other melanin hyperpigmentation: Secondary | ICD-10-CM | POA: Diagnosis not present

## 2017-06-16 DIAGNOSIS — L570 Actinic keratosis: Secondary | ICD-10-CM | POA: Diagnosis not present

## 2017-06-16 DIAGNOSIS — L218 Other seborrheic dermatitis: Secondary | ICD-10-CM | POA: Diagnosis not present

## 2017-06-16 DIAGNOSIS — L821 Other seborrheic keratosis: Secondary | ICD-10-CM | POA: Diagnosis not present

## 2017-06-30 ENCOUNTER — Other Ambulatory Visit (HOSPITAL_COMMUNITY): Payer: Self-pay | Admitting: *Deleted

## 2017-06-30 ENCOUNTER — Telehealth (HOSPITAL_COMMUNITY): Payer: Self-pay | Admitting: *Deleted

## 2017-06-30 DIAGNOSIS — R0609 Other forms of dyspnea: Secondary | ICD-10-CM

## 2017-06-30 NOTE — Telephone Encounter (Signed)
Pt cld concerned about SOB and wanting to know what her follow up should be.  Per Roderic Palau, NP last OV note pt was given some med options to weigh, amio, Tikosyn and sotalol but was told to call and check the price of the tikosyn generic and sotalol.  Pt was also advised that due to kidney function she would only be able to do one dose daily of sotalol or 250 mcg of tikosyn.  Pt stated that she remembered taking amiodarone at one time and did not like the side effects.  Pt will recheck price of Tikosyn for the generic pricing and requested that Butch Penny order a stress test and refer her to cardiology in the meantime.  Butch Penny has agreed to do this since pt states SOB has increased as well as tight feeling in chest with exertion.  Depending on the results we will know how soon to get her in with cardiology. The order was put in for stress test and pt was advised that the [address] office would contact her regarding the stress test sched.  Pt understood and stated that she would price the meds.

## 2017-07-02 ENCOUNTER — Telehealth (HOSPITAL_COMMUNITY): Payer: Self-pay | Admitting: *Deleted

## 2017-07-02 NOTE — Telephone Encounter (Signed)
Patient given detailed instructions per Myocardial Perfusion Study Information Sheet for the test on 07/03/17 at 7:45. Patient notified to arrive 15 minutes early and that it is imperative to arrive on time for appointment to keep from having the test rescheduled.  If you need to cancel or reschedule your appointment, please call the office within 24 hours of your appointment. . Patient verbalized understanding.Susan Fitzgerald

## 2017-07-03 ENCOUNTER — Ambulatory Visit (HOSPITAL_COMMUNITY): Payer: Medicare Other | Attending: Cardiology

## 2017-07-03 DIAGNOSIS — I4891 Unspecified atrial fibrillation: Secondary | ICD-10-CM | POA: Insufficient documentation

## 2017-07-03 DIAGNOSIS — I2510 Atherosclerotic heart disease of native coronary artery without angina pectoris: Secondary | ICD-10-CM | POA: Insufficient documentation

## 2017-07-03 DIAGNOSIS — R0609 Other forms of dyspnea: Secondary | ICD-10-CM | POA: Insufficient documentation

## 2017-07-03 DIAGNOSIS — R079 Chest pain, unspecified: Secondary | ICD-10-CM | POA: Diagnosis present

## 2017-07-03 DIAGNOSIS — I10 Essential (primary) hypertension: Secondary | ICD-10-CM | POA: Insufficient documentation

## 2017-07-03 MED ORDER — TECHNETIUM TC 99M TETROFOSMIN IV KIT
10.3000 | PACK | Freq: Once | INTRAVENOUS | Status: AC | PRN
  Administered 2017-07-03: 10.3 via INTRAVENOUS
  Filled 2017-07-03: qty 11

## 2017-07-03 MED ORDER — TECHNETIUM TC 99M TETROFOSMIN IV KIT
32.9000 | PACK | Freq: Once | INTRAVENOUS | Status: AC | PRN
  Administered 2017-07-03: 32.9 via INTRAVENOUS
  Filled 2017-07-03: qty 33

## 2017-07-03 MED ORDER — REGADENOSON 0.4 MG/5ML IV SOLN
0.4000 mg | Freq: Once | INTRAVENOUS | Status: AC
  Administered 2017-07-03: 0.4 mg via INTRAVENOUS

## 2017-07-04 LAB — MYOCARDIAL PERFUSION IMAGING
LHR: 0.33
LV dias vol: 73 mL (ref 46–106)
LVSYSVOL: 35 mL
NUC STRESS TID: 1
Peak HR: 121 {beats}/min
Rest HR: 115 {beats}/min
SDS: 0
SRS: 0
SSS: 0

## 2017-07-07 ENCOUNTER — Other Ambulatory Visit (HOSPITAL_COMMUNITY): Payer: Self-pay | Admitting: Nurse Practitioner

## 2017-07-07 ENCOUNTER — Telehealth (HOSPITAL_COMMUNITY): Payer: Self-pay | Admitting: Nurse Practitioner

## 2017-07-07 NOTE — Telephone Encounter (Signed)
Pt feels like she is more short of breath. Appointment pending with Dr. Percival Spanish on Friday.Recent stress test low risk.. Discussed that this may be from afib and we could still plan on admission for AAD therapy.  She wants to see what he thinks first as she feels it may be from something else.

## 2017-07-10 NOTE — Progress Notes (Signed)
HPI The patient presents for evaluation of atrial fibrillation. She was cardioverted.  However, she developed recurrent atrial fib.  She had her flecainide does reduced because of a prolonged QT interval.  The flecainide was subsequently discontinued as she had reverted to atrial fib.   She has been followed in the atrial fib clinic.  She did describe chest pain and was sent for [facility] which did not suggest ischemia.  She does have a mildly reduced EF (40 - 45% on echo.)    She is symptomatic walking a short to moderate distance on level ground. She's had increasing shortness of breath.  She is not describing PND or orthopnea.  She does have palpitations but she's not having any presyncope or syncope. One of her most distressing symptoms is a fullness in her neck particularly when she bends over. She was able to golf 18 holes but this really wore her out yesterday. She's not having any chest pressure, neck or arm discomfort. She's been followed closely in the [facility] and we have discussed strategies.   Allergies  Allergen Reactions  . Benazepril Hcl Cough    Current Outpatient Prescriptions  Medication Sig Dispense Refill  . calcium-vitamin D (OSCAL WITH D) 250-125 MG-UNIT per tablet Take 1 tablet by mouth 2 (two) times daily.    Marland Kitchen diltiazem (CARDIZEM CD) 180 MG 24 hr capsule Take 1 capsule (180 mg total) by mouth daily. 180 capsule 2  . ELIQUIS 2.5 MG TABS tablet TAKE 1 TABLET BY MOUTH 2  TIMES DAILY. PUT ON HOLD  UNTIL PATIENT REQUESTS. 300 tablet 1  . folic acid (FOLVITE) 1 MG tablet Take 1 mg by mouth daily.    . furosemide (LASIX) 20 MG tablet Take 20 mg by mouth.    . gabapentin (NEURONTIN) 100 MG capsule Take 100 mg by mouth daily as needed.    Marland Kitchen lisinopril (PRINIVIL,ZESTRIL) 40 MG tablet TAKE 1 TABLET BY MOUTH  DAILY 90 tablet 1  . methotrexate (RHEUMATREX) 2.5 MG tablet Take 10 mg by mouth once a week.     . Multiple Vitamin (MULTIVITAMIN) capsule Take  1 capsule by mouth daily.    . pindolol (VISKEN) 10 MG tablet Take 15-20 mg by mouth See admin instructions. Take 2 tablets every morning and take 1 and 1/2 tablet at night    . pindolol (VISKEN) 10 MG tablet TAKE 2 TABLETS BY MOUTH  EVERY MORNING AND 1-1/2  TABLETS EVERY EVENING 315 tablet 0  . potassium chloride SA (K-DUR,KLOR-CON) 20 MEQ tablet TAKE 1 TABLET BY MOUTH  DAILY 90 tablet 1  . amiodarone (PACERONE) 400 MG tablet Take 1 tablet (400 mg total) by mouth 2 (two) times daily. 180 tablet 3   No current facility-administered medications for this visit.     Past Medical History:  Diagnosis Date  . Arthritis   . Bradycardia 2014  . Cataract   . Diverticulosis   . Dyspnea    periodically  . Dysrhythmia    a - fib  . Heart murmur    ??? bruit  . History of stress test    a. 04/09/13-nuclear stress-no ischemia low risk  . Hypertension   . Menopause   . Osteoporosis   . PAF (paroxysmal atrial fibrillation) ([place])    a. s/p prior DCCV; b. On flecainide & coumadin (CHA2DS2VASc = 4);  c. 03/2016 Echo: EF 55-60%, mod LVH, mod AI, mild to mod TR, PASP 58mmHg.  Marland Kitchen Pelvic fracture ([place])  Past Surgical History:  Procedure Laterality Date  . BIOPSY BREAST    . BIOPSY BREAST     right & benign  . BREAST SURGERY    . CARDIOVERSION N/A 10/30/2016   Procedure: CARDIOVERSION;  Surgeon: Larey Dresser, MD;  Location: [place];  Service: Cardiovascular;  Laterality: N/A;  . CATARACT EXTRACTION    . EYE SURGERY     cataracts  . TEE WITHOUT CARDIOVERSION N/A 10/30/2016   Procedure: TRANSESOPHAGEAL ECHOCARDIOGRAM (TEE);  Surgeon: Larey Dresser, MD;  Location: [place];  Service: Cardiovascular;  Laterality: N/A;  . TOTAL HIP ARTHROPLASTY Right 01/27/2017   Procedure: TOTAL HIP ARTHROPLASTY ANTERIOR APPROACH;  Surgeon: Rod Can, MD;  Location: [place];  Service: Orthopedics;  Laterality: Right;    ROS:  As stated in the HPI and negative for all other systems.   PHYSICAL  EXAM BP (!) 150/96 (BP Location: Left Arm)   Pulse (!) 114   Ht 5\' 1"  (1.549 m)   Wt 134 lb 6.4 oz (61 kg)   BMI 25.39 kg/m  GENERAL:  Well appearing NECK:  Positive jugular venous distention, CV wave,  waveform within normal limits, carotid upstroke brisk and symmetric, no bruits, no thyromegaly LUNGS:  Clear to auscultation bilaterally BACK:  No CVA tenderness CHEST:  Unremarkable HEART:  PMI not displaced or sustained,S1 and S2 within normal limits, no S3,  no clicks, no rubs, no murmurs, irregular ABD:  Flat, positive bowel sounds normal in frequency in pitch, no bruits, no rebound, no guarding, no midline pulsatile mass, no hepatomegaly, no splenomegaly EXT:  2 plus pulses throughout, no edema, no cyanosis no clubbing   EKG:  Atrial fibrillation, 114, left axis deviation, poor anterior R wave progression, no acute ST-T wave changes.  07/13/2017    ASSESSMENT AND PLAN  HTN:   Is elevated but we will be treating this in the context of treating her atrial fib.   ATRIAL FIB:   [name] has a CHA2DS2 - VASc score of 4 with a risk of stroke of 4%.  I'm going to check a BNP. I'm going ask her to take her Lasix 40 mg a day for the next were days. However, I think the most critical thing is trying to get her back in sinus rhythm. Talked about the pluses and minuses of various therapies to include sotalol, Tikosyn and I went back through multiple old records. She was on amiodarone briefly before she started seeing me in 2016. I don't see any allergies or any absolute contraindications to this. She thinks the  Tiko would be cost prohibitive. She only be able to take a low dose of the sotalol with renal function. Therefore, I'm going to start her on amiodarone. We discussed the risks benefits. She'll come back in a couple of weeks to have her meds adjusted. Eventually she would need cardioversion.   TR:  This certainly causes the fullness sensation in her neck. I'm going to manage this as  above. This was noted on echo but without severe pulmonary hypertension in March. I will follow this up with the echoes.

## 2017-07-11 ENCOUNTER — Ambulatory Visit (INDEPENDENT_AMBULATORY_CARE_PROVIDER_SITE_OTHER): Payer: Medicare Other | Admitting: Cardiology

## 2017-07-11 VITALS — BP 150/96 | HR 114 | Ht 61.0 in | Wt 134.4 lb

## 2017-07-11 DIAGNOSIS — R0602 Shortness of breath: Secondary | ICD-10-CM | POA: Diagnosis not present

## 2017-07-11 DIAGNOSIS — I4819 Other persistent atrial fibrillation: Secondary | ICD-10-CM

## 2017-07-11 DIAGNOSIS — I481 Persistent atrial fibrillation: Secondary | ICD-10-CM | POA: Diagnosis not present

## 2017-07-11 MED ORDER — AMIODARONE HCL 400 MG PO TABS: 400.0000 mg | ORAL_TABLET | Freq: Two times a day (BID) | ORAL | 3 refills | Status: DC

## 2017-07-11 NOTE — Patient Instructions (Signed)
Medication Instructions:  START- Amiodarone 400 mg twice a day INCREASE- Lasix 40 mg daily for 4 days  Labwork: BNP Today  Testing/Procedures: None Ordered  Follow-Up: Your physician recommends that you schedule a follow-up appointment in: 2 Weeks with Roderic Palau   Any Other Special Instructions Will Be Listed Below (If Applicable).   If you need a refill on your cardiac medications before your next appointment, please call your pharmacy.

## 2017-07-12 LAB — BRAIN NATRIURETIC PEPTIDE: BNP: 471.6 pg/mL — ABNORMAL HIGH (ref 0.0–100.0)

## 2017-07-13 ENCOUNTER — Encounter: Payer: Self-pay | Admitting: Cardiology

## 2017-07-13 DIAGNOSIS — R0602 Shortness of breath: Secondary | ICD-10-CM | POA: Insufficient documentation

## 2017-07-14 NOTE — Addendum Note (Signed)
Addended by: Vennie Homans on: 07/14/2017 05:31 PM   Modules accepted: Orders

## 2017-07-28 ENCOUNTER — Encounter (HOSPITAL_COMMUNITY): Payer: Self-pay | Admitting: Nurse Practitioner

## 2017-07-28 ENCOUNTER — Ambulatory Visit (HOSPITAL_COMMUNITY)
Admission: RE | Admit: 2017-07-28 | Discharge: 2017-07-28 | Disposition: A | Discharge status: Home / Self Care | Payer: Medicare Other | Source: Ambulatory Visit | Attending: Nurse Practitioner | Admitting: Nurse Practitioner

## 2017-07-28 VITALS — BP 152/84 | HR 91 | Ht 61.0 in | Wt 131.4 lb

## 2017-07-28 DIAGNOSIS — R0609 Other forms of dyspnea: Secondary | ICD-10-CM | POA: Insufficient documentation

## 2017-07-28 DIAGNOSIS — Z79899 Other long term (current) drug therapy: Secondary | ICD-10-CM | POA: Insufficient documentation

## 2017-07-28 DIAGNOSIS — I4581 Long QT syndrome: Secondary | ICD-10-CM | POA: Diagnosis not present

## 2017-07-28 DIAGNOSIS — I481 Persistent atrial fibrillation: Secondary | ICD-10-CM

## 2017-07-28 DIAGNOSIS — I4819 Other persistent atrial fibrillation: Secondary | ICD-10-CM

## 2017-07-28 DIAGNOSIS — I10 Essential (primary) hypertension: Secondary | ICD-10-CM | POA: Diagnosis not present

## 2017-07-28 DIAGNOSIS — Z7901 Long term (current) use of anticoagulants: Secondary | ICD-10-CM | POA: Insufficient documentation

## 2017-07-28 MED ORDER — AMIODARONE HCL 400 MG PO TABS: 200.0000 mg | ORAL_TABLET | Freq: Two times a day (BID) | ORAL | 3 refills | Status: DC

## 2017-07-28 NOTE — Progress Notes (Signed)
Primary Care Physician: Chipper Herb, MD Referring Physician: Ignacia Bayley, NP Cardiologist: Dr. Neomia Dear Susan Fitzgerald is a 83 y.o. female with a h/o paroxysmal afib that is in the afib clinic for evaluation for recent increase in afib burden. She has  history of atrial fibrillation status post prior cardioversion and subsequent antiarrhythmic therapy with flecainide. She is anticoagulated with Coumadin. She also takes pindolol. Over the years, she has had occasional paroxysms of short-lived A. fib. Earlier this year, she had a period of atrial fibrillation, which is usually associated with dyspnea when it occurs, and she took an additional pindolol and this subsequently broke. She was doing well when she last saw Dr. Percival Spanish in June. Since early September however, she has noted more frequent elevated heart rates and irregular heart rhythm. When in A. fib, rates are typically in the mid 90s to low 100s. She records her blood pressures and heart rates on a regular basis and has shown these to me today. Over the past 2 months, she is mostly in the 80s to 90s though occasionally will have rates in the 70s and 60s. She does not think that she has been in atrial fibrillation all of the time over the past 2 months. As above, she does have dyspnea on exertion when in A. fib but denies chest pain, PND, orthopnea, dizziness, syncope, or early satiety. She does have some degree of mild chronic venous stasis and lower extremity swelling and recently taken off amlodipine.  On visit with me, she feels that she has been in afib consistently over the last two weeks. I reviewed her V/S and her heart rate has been elevated closer to 100 bpm. In SR, she has a heart rate closer to 60. Discussion with pt for cardioversion with continuation on flecainide or flecainide washout and change to tikosyn. Pt is also planning to have an epidural Tuesday and will have to stop coumadin tonight. She wants to pursue this prior  to any change in afib management. It was also discussed that coumadin will have to be therapeutic x 3-4 weeks prior to any change in therapy.  Pt returns to afib clinic 11/30, s/p epidural for back pain for which pt had to stop warfarin and as of yet, has become therapeutic since restarting drug. The injection was not that helpful and pt now thinks she may need a hip replacement after the first of the year. Options of  restoring SR were discussed again and she would like to proceed with cardioversion with flecainide to see if flecainide will continue to work for pt. She has had good success with this drug over the years and is not that willing to move on to other options yet. She is wanting to do this as soon as possible so she can then plan for hip surgery. She is aware that she cannot interrupt blood thinner for at least 30 days after cardioversion. If that fails, she would next be interested in [name], if she can afford drug.  She is aware that she will require TEE guided cardioversion since she will not be therapeutic 4 weeks prior to cardioversion.  Returns to afib clinic one week s/p cardioversion. She is in SR but is still having exertional dyspnea. She was hoping that return to SR would improve this. EKG shows more pronounced T waves in the ant/inf leads. She denies exertional chest pain. Her BP was elevated at time of cardioversion and she received IV meds. At  home, it continues to be elevated. It was around 619 systolic on arrival, rechecked at 180/80. She was on 2.5 mg of amlodipine which was stopped in November for mild ankle swelling. She continues to have mild ankle swelling off drug and occasionally worse in rt leg which she will be seeing a orthopedic surgeon re today and may be pending surgery. Gabapentin was added since last visit but pt has not noted too much change in leg discomfort. Qtc is also prolonged today, unsure why, previous ekg's reviewed and qtc was in range. No other new meds to  explain change. She does use prn lasix. BP rechecked at 180/80. Echo in May showed moderate aortic regurg, normal EF with moderate diastolic dysfunction, pulmonary pressure increased at 53 mm Hg.  Return to afib clinic 1/12. She saw Dr. Percival Spanish for her ongoing shortness of breath and needing cardiac clearance for her desired back/hip sugery. However, she was not well rate controlled having stopped flecainide for prolonged QTc. She is wanting surgery so will try to rate control to get her thru surgery and address restoring SR at a later date. Dr.Hochrein started cardizem 30 mg tid which has helped control her heart rate but has contributed to swelling of her left leg, which is the one that is needing surgery. Review of her HR's at hone indicate that majority of HR is now upper 80's to 90's, occasion low 100's, and would like to see HR's slower in the low 80's's to be in the best shape for surgery. Increased pindolol to 20 mg am and continued 15 mg pm.  She returns today,1/19 with afib poorly controlled, v rates in the 120's, despite increasing BB and CCB recently added. She continues to have LLE which is chronic but slightly more with addition of Cardizem. Review of her history shows  she was on amiodarone at one time but was stopped due to intolerance, possibly due to elevated liver enzymes. Her renal function is calculated  at 40 ml/min, which would probably limit dose of  Tikosyn to 125 mcg to possible 250 mcg bid. She really does not want to come into the hospital. Flecainide was recently stopped due to prolonged qtc and this would be a concern with tikosyn . Sotalol would also be a renal concern since it is contraindicated with renal cl less than 40 ml/min. As persisitent as her afib has been, I do not think that multaq would be effective. She really wants to achieve rate control and be able to have her surgery.  F/u in the afib clinic for a recent ER visit for afib with RVR. Pt recently had her rt hip  replaced and did well during surgery with afib staying rate controlled. However, she has had some issues with ambulating weakness/ pain since being home and last Sunday started having a lot of abdominal discomfort and noted afib with rvr. She was told to go to the ER where her HR was around 150 bpm. She was started on IV dilt and then transitioned to po cardizem. She went home on 360 mg cardizem.  Pt then called a few days after d/c and was c/o of a very low BP at 80 systolic and weakness after taking her am Cardizem dose. HR was controlled. She was advised not to take any more meds that day that would affect BP and after that day to divide the cardizem dose to 180 mg bid.  Since then, BP/HR at home have been better controlled. She is still very  weak with surgery and disappointed that her ambulation is not progressing very quickly.  F/u in afib clinic, 7/23. She remains in rate controlled afib. Has regained a lot of her strength from hip surgery and walked in clinic today vrs riding in a w/c. She is back to playing golf. Again asks today re options to get back in SR. She also has LLE possibly 2/2 cardizem.  F/u afib clinic, 9/10. She saw Dr. Percival Spanish and it was decided that pt would start on amiodarone for her exertional dyspnea, thought to be 2/2 afib. She was started on 400 mg bid 8/25. She has had 3 bouts of vomiting since then and mild generalized nausea. She will reduce amio to 200 mg bid and we will try cardioversion in 2 weeks.No missed doses of eliquis.   Today, she denies symptoms of  chest pain,  orthopnea, PND,  dizziness, presyncope, syncope, or neurologic sequela.   Positive for exertional shortness of breath, LEE left worse than rt . The patient is tolerating medications without difficulties and is otherwise without complaint today.   Past Medical History:  Diagnosis Date  . Arthritis   . Bradycardia 2014  . Cataract   . Diverticulosis   . Dyspnea    periodically  . Dysrhythmia     a - fib  . Heart murmur    ??? bruit  . History of stress test    a. 04/09/13-nuclear stress-no ischemia low risk  . Hypertension   . Menopause   . Osteoporosis   . PAF (paroxysmal atrial fibrillation) ([place])    a. s/p prior DCCV; b. On flecainide & coumadin (CHA2DS2VASc = 4);  c. 03/2016 Echo: EF 55-60%, mod LVH, mod AI, mild to mod TR, PASP 2mmHg.  Marland Kitchen Pelvic fracture [facility])    Past Surgical History:  Procedure Laterality Date  . BIOPSY BREAST    . BIOPSY BREAST     right & benign  . BREAST SURGERY    . CARDIOVERSION N/A 10/30/2016   Procedure: CARDIOVERSION;  Surgeon: Larey Dresser, MD;  Location: [place];  Service: Cardiovascular;  Laterality: N/A;  . CATARACT EXTRACTION    . EYE SURGERY     cataracts  . TEE WITHOUT CARDIOVERSION N/A 10/30/2016   Procedure: TRANSESOPHAGEAL ECHOCARDIOGRAM (TEE);  Surgeon: Larey Dresser, MD;  Location: [place];  Service: Cardiovascular;  Laterality: N/A;  . TOTAL HIP ARTHROPLASTY Right 01/27/2017   Procedure: TOTAL HIP ARTHROPLASTY ANTERIOR APPROACH;  Surgeon: Rod Can, MD;  Location: [place];  Service: Orthopedics;  Laterality: Right;    Current Outpatient Prescriptions  Medication Sig Dispense Refill  . amiodarone (PACERONE) 400 MG tablet Take 0.5 tablets (200 mg total) by mouth 2 (two) times daily. 180 tablet 3  . calcium-vitamin D (OSCAL WITH D) 250-125 MG-UNIT per tablet Take 1 tablet by mouth 2 (two) times daily.    Marland Kitchen diltiazem (CARDIZEM CD) 180 MG 24 hr capsule Take 1 capsule (180 mg total) by mouth daily. 180 capsule 2  . ELIQUIS 2.5 MG TABS tablet TAKE 1 TABLET BY MOUTH 2  TIMES DAILY. PUT ON HOLD  UNTIL PATIENT REQUESTS. 702 tablet 1  . folic acid (FOLVITE) 1 MG tablet Take 1 mg by mouth daily.    . furosemide (LASIX) 20 MG tablet Take 20 mg by mouth.    . gabapentin (NEURONTIN) 100 MG capsule Take 100 mg by mouth daily as needed.    Marland Kitchen lisinopril (PRINIVIL,ZESTRIL) 40 MG tablet TAKE 1 TABLET BY MOUTH  DAILY  90 tablet 1    . methotrexate (RHEUMATREX) 2.5 MG tablet Take 10 mg by mouth once a week.     . Multiple Vitamin (MULTIVITAMIN) capsule Take 1 capsule by mouth daily.    . pindolol (VISKEN) 10 MG tablet Take 15-20 mg by mouth See admin instructions. Take 2 tablets every morning and take 1 and 1/2 tablet at night    . pindolol (VISKEN) 10 MG tablet TAKE 2 TABLETS BY MOUTH  EVERY MORNING AND 1-1/2  TABLETS EVERY EVENING 315 tablet 0  . potassium chloride SA (K-DUR,KLOR-CON) 20 MEQ tablet TAKE 1 TABLET BY MOUTH  DAILY 90 tablet 1   No current facility-administered medications for this encounter.     Allergies  Allergen Reactions  . Benazepril Hcl Cough    Social History   Social History  . Marital status: Married    Spouse name: N/A  . Number of children: N/A  . Years of education: N/A   Occupational History  . Not on file.   Social History Main Topics  . Smoking status: Never Smoker  . Smokeless tobacco: Never Used  . Alcohol use No  . Drug use: No  . Sexual activity: Yes   Other Topics Concern  . Not on file   Social History Narrative  . No narrative on file    Family History  Problem Relation Age of Onset  . Stroke Mother        cerebral hemorrhage  . Heart disease Father        MI  . Heart attack Father   . Vision loss Father   . Heart disease Brother   . Heart attack Brother   . Cancer Maternal Aunt        breast  . Cancer Brother   . Heart disease Brother   . Cancer Daughter        breast    ROS- All systems are reviewed and negative except as per the HPI above  Physical Exam: Vitals:   07/28/17 1054  BP: (!) 152/84  Pulse: 91  Weight: 131 lb 6.4 oz (59.6 kg)  Height: 5\' 1"  (1.549 m)    GEN- The patient is well appearing, alert and oriented x 3 today.   Head- normocephalic, atraumatic Eyes-  Sclera clear, conjunctiva pink Ears- hearing intact Oropharynx- clear Neck- supple, no JVP Lymph- no cervical lymphadenopathy Lungs- Clear to ausculation  bilaterally, normal work of breathing Heart-  irregular rate and rhythm, no murmurs, rubs or gallops, PMI not laterally displaced GI- soft, NT, ND, + BS Extremities- no clubbing, cyanosis, or mild LLE, rt greater than left, with compression socks on. MS- no significant deformity or atrophy Skin- no rash or lesion Psych- euthymic mood, full affect Neuro- strength and sensation are intact  EKG- afib at 91 bpm, qrs int 86 ms, qtc 504 ms Epic records reviewed Echo- 03/18/16 LV EF: 55% -   60%  ------------------------------------------------------------------- Indications:      SOB (R06.00).  ------------------------------------------------------------------- Study Conclusions  - Left ventricle: The cavity size was normal. Wall thickness was   increased in a pattern of moderate LVH. Systolic function was   normal. The estimated ejection fraction was in the range of 55%   to 60%. - Aortic valve: There was moderate regurgitation. - Tricuspid valve: There was mild-moderate regurgitation. - Pulmonary arteries: Systolic pressure was moderately increased.  Assessment and Plan: 1. Persistent afib  Chronic exertional dyspnea thought 2/2 afib Has been on amiodarone 400 mg bid since  8/25, some N/V Reduce amio to 200 mg bid today Continue cardizem to 180 mg qd   Continue pindolol  No missed doses of eliquis  will bring back in 2 weeks and will schedule for cardioversion  2. HTN Stable  3. QTC prolongation Mildly prolonged on amiodarone but should shorten with reduction in dose   F/u in 2 weeks   Butch Penny C. Tyiesha Brackney, [facility] [address] [place], [place] [address] [phone]

## 2017-07-28 NOTE — Patient Instructions (Signed)
Your physician has recommended you make the following change in your medication:  1)Decrease Amiodarone 200mg  twice a day

## 2017-08-05 DIAGNOSIS — Z96641 Presence of right artificial hip joint: Secondary | ICD-10-CM | POA: Diagnosis not present

## 2017-08-05 DIAGNOSIS — Z471 Aftercare following joint replacement surgery: Secondary | ICD-10-CM | POA: Diagnosis not present

## 2017-08-06 ENCOUNTER — Telehealth: Payer: Self-pay | Admitting: Family Medicine

## 2017-08-06 NOTE — Telephone Encounter (Signed)
Pt aware to call Cardio with questions regarding eliquis - she is also aware that we do not have any samples at this time

## 2017-08-15 ENCOUNTER — Ambulatory Visit (HOSPITAL_COMMUNITY)
Admission: RE | Admit: 2017-08-15 | Discharge: 2017-08-15 | Disposition: A | Discharge status: Home / Self Care | Payer: Medicare Other | Source: Ambulatory Visit | Attending: Nurse Practitioner | Admitting: Nurse Practitioner

## 2017-08-15 ENCOUNTER — Encounter (HOSPITAL_COMMUNITY): Payer: Self-pay | Admitting: Nurse Practitioner

## 2017-08-15 VITALS — BP 160/82 | HR 80 | Ht 61.0 in | Wt 130.8 lb

## 2017-08-15 DIAGNOSIS — I481 Persistent atrial fibrillation: Secondary | ICD-10-CM | POA: Diagnosis not present

## 2017-08-15 DIAGNOSIS — Z7901 Long term (current) use of anticoagulants: Secondary | ICD-10-CM | POA: Diagnosis not present

## 2017-08-15 DIAGNOSIS — I4819 Other persistent atrial fibrillation: Secondary | ICD-10-CM

## 2017-08-15 DIAGNOSIS — R001 Bradycardia, unspecified: Secondary | ICD-10-CM | POA: Diagnosis not present

## 2017-08-15 DIAGNOSIS — I480 Paroxysmal atrial fibrillation: Secondary | ICD-10-CM | POA: Diagnosis not present

## 2017-08-15 DIAGNOSIS — I10 Essential (primary) hypertension: Secondary | ICD-10-CM | POA: Insufficient documentation

## 2017-08-15 DIAGNOSIS — R0602 Shortness of breath: Secondary | ICD-10-CM | POA: Diagnosis not present

## 2017-08-15 DIAGNOSIS — R011 Cardiac murmur, unspecified: Secondary | ICD-10-CM | POA: Diagnosis not present

## 2017-08-15 DIAGNOSIS — Z79899 Other long term (current) drug therapy: Secondary | ICD-10-CM | POA: Diagnosis not present

## 2017-08-15 DIAGNOSIS — Z780 Asymptomatic menopausal state: Secondary | ICD-10-CM | POA: Diagnosis not present

## 2017-08-15 LAB — CBC
HCT: 38.8 % (ref 36.0–46.0)
Hemoglobin: 12.4 g/dL (ref 12.0–15.0)
MCH: 30.6 pg (ref 26.0–34.0)
MCHC: 32 g/dL (ref 30.0–36.0)
MCV: 95.8 fL (ref 78.0–100.0)
PLATELETS: 159 10*3/uL (ref 150–400)
RBC: 4.05 MIL/uL (ref 3.87–5.11)
RDW: 15.9 % — ABNORMAL HIGH (ref 11.5–15.5)
WBC: 4.8 10*3/uL (ref 4.0–10.5)

## 2017-08-15 LAB — COMPREHENSIVE METABOLIC PANEL
ALT: 49 U/L (ref 14–54)
AST: 47 U/L — AB (ref 15–41)
Albumin: 3.5 g/dL (ref 3.5–5.0)
Alkaline Phosphatase: 79 U/L (ref 38–126)
Anion gap: 7 (ref 5–15)
BUN: 18 mg/dL (ref 6–20)
CALCIUM: 8.9 mg/dL (ref 8.9–10.3)
CHLORIDE: 105 mmol/L (ref 101–111)
CO2: 28 mmol/L (ref 22–32)
Creatinine, Ser: 1.11 mg/dL — ABNORMAL HIGH (ref 0.44–1.00)
GFR calc non Af Amer: 45 mL/min — ABNORMAL LOW (ref >60)
GFR, EST AFRICAN AMERICAN: 52 mL/min — AB (ref >60)
Glucose, Bld: 77 mg/dL (ref 65–99)
Potassium: 3.6 mmol/L (ref 3.5–5.1)
SODIUM: 140 mmol/L (ref 135–145)
Total Bilirubin: 0.7 mg/dL (ref 0.3–1.2)
Total Protein: 6.5 g/dL (ref 6.5–8.1)

## 2017-08-15 LAB — TSH: TSH: 13.641 u[IU]/mL — ABNORMAL HIGH (ref 0.350–4.500)

## 2017-08-15 LAB — T4, FREE: Free T4: 0.79 ng/dL (ref 0.61–1.12)

## 2017-08-15 NOTE — Patient Instructions (Addendum)
Cardioversion scheduled for Wednesday, October 3rd  - Arrive at the [facility] and go to admitting at 9:30AM  - Do not eat or drink anything after midnight the night prior to your procedure.  - Take all your medication with a sip of water prior to arrival.  - Do not miss any doses of your Eliquis  - Continue Amiodarone 200mg  twice a day until we see you for follow up  - You will not be able to drive home after your procedure.

## 2017-08-15 NOTE — Progress Notes (Signed)
Primary Care Physician: Chipper Herb, MD Referring Physician: Ignacia Bayley, NP Cardiologist: Dr. Neomia Dear Susan Fitzgerald is a 83 y.o. female with a h/o paroxysmal afib that is in the afib clinic for evaluation for recent increase in afib burden. She has  history of atrial fibrillation status post prior cardioversion and subsequent antiarrhythmic therapy with flecainide. She is anticoagulated with Coumadin. She also takes pindolol. Over the years, she has had occasional paroxysms of short-lived A. fib. Earlier this year, she had a period of atrial fibrillation, which is usually associated with dyspnea when it occurs, and she took an additional pindolol and this subsequently broke. She was doing well when she last saw Dr. Percival Spanish in June. Since early September however, she has noted more frequent elevated heart rates and irregular heart rhythm. When in A. fib, rates are typically in the mid 90s to low 100s. She records her blood pressures and heart rates on a regular basis and has shown these to me today. Over the past 2 months, she is mostly in the 80s to 90s though occasionally will have rates in the 70s and 60s. She does not think that she has been in atrial fibrillation all of the time over the past 2 months. As above, she does have dyspnea on exertion when in A. fib but denies chest pain, PND, orthopnea, dizziness, syncope, or early satiety. She does have some degree of mild chronic venous stasis and lower extremity swelling and recently taken off amlodipine.  On visit with me, she feels that she has been in afib consistently over the last two weeks. I reviewed her V/S and her heart rate has been elevated closer to 100 bpm. In SR, she has a heart rate closer to 60. Discussion with pt for cardioversion with continuation on flecainide or flecainide washout and change to tikosyn. Pt is also planning to have an epidural Tuesday and will have to stop coumadin tonight. She wants to pursue this prior  to any change in afib management. It was also discussed that coumadin will have to be therapeutic x 3-4 weeks prior to any change in therapy.  Pt returns to afib clinic 11/30, s/p epidural for back pain for which pt had to stop warfarin and as of yet, has become therapeutic since restarting drug. The injection was not that helpful and pt now thinks she may need a hip replacement after the first of the year. Options of  restoring SR were discussed again and she would like to proceed with cardioversion with flecainide to see if flecainide will continue to work for pt. She has had good success with this drug over the years and is not that willing to move on to other options yet. She is wanting to do this as soon as possible so she can then plan for hip surgery. She is aware that she cannot interrupt blood thinner for at least 30 days after cardioversion. If that fails, she would next be interested in [name], if she can afford drug.  She is aware that she will require TEE guided cardioversion since she will not be therapeutic 4 weeks prior to cardioversion.  Returns to afib clinic one week s/p cardioversion. She is in SR but is still having exertional dyspnea. She was hoping that return to SR would improve this. EKG shows more pronounced T waves in the ant/inf leads. She denies exertional chest pain. Her BP was elevated at time of cardioversion and she received IV meds. At  home, it continues to be elevated. It was around 671 systolic on arrival, rechecked at 180/80. She was on 2.5 mg of amlodipine which was stopped in November for mild ankle swelling. She continues to have mild ankle swelling off drug and occasionally worse in rt leg which she will be seeing a orthopedic surgeon re today and may be pending surgery. Gabapentin was added since last visit but pt has not noted too much change in leg discomfort. Qtc is also prolonged today, unsure why, previous ekg's reviewed and qtc was in range. No other new meds to  explain change. She does use prn lasix. BP rechecked at 180/80. Echo in May showed moderate aortic regurg, normal EF with moderate diastolic dysfunction, pulmonary pressure increased at 53 mm Hg.  Return to afib clinic 1/12. She saw Dr. Percival Spanish for her ongoing shortness of breath and needing cardiac clearance for her desired back/hip sugery. However, she was not well rate controlled having stopped flecainide for prolonged QTc. She is wanting surgery so will try to rate control to get her thru surgery and address restoring SR at a later date. Dr.Hochrein started cardizem 30 mg tid which has helped control her heart rate but has contributed to swelling of her left leg, which is the one that is needing surgery. Review of her HR's at [place] indicate that majority of HR is now upper 80's to 90's, occasion low 100's, and would like to see HR's slower in the low 80's's to be in the best shape for surgery. Increased pindolol to 20 mg am and continued 15 mg pm.  She returns today,1/19 with afib poorly controlled, v rates in the 120's, despite increasing BB and CCB recently added. She continues to have LLE which is chronic but slightly more with addition of Cardizem. Review of her history shows  she was on amiodarone at one time but was stopped due to intolerance, possibly due to elevated liver enzymes. Her renal function is calculated  at 40 ml/min, which would probably limit dose of  Tikosyn to 125 mcg to possible 250 mcg bid. She really does not want to come into the hospital. Flecainide was recently stopped due to prolonged qtc and this would be a concern with tikosyn . Sotalol would also be a renal concern since it is contraindicated with renal cl less than 40 ml/min. As persisitent as her afib has been, I do not think that multaq would be effective. She really wants to achieve rate control and be able to have her surgery.  F/u in the afib clinic for a recent ER visit for afib with RVR. Pt recently had her rt hip  replaced and did well during surgery with afib staying rate controlled. However, she has had some issues with ambulating weakness/ pain since being home and last Sunday started having a lot of abdominal discomfort and noted afib with rvr. She was told to go to the ER where her HR was around 150 bpm. She was started on IV dilt and then transitioned to po cardizem. She went home on 360 mg cardizem.  Pt then called a few days after d/c and was c/o of a very low BP at 80 systolic and weakness after taking her am Cardizem dose. HR was controlled. She was advised not to take any more meds that day that would affect BP and after that day to divide the cardizem dose to 180 mg bid.  Since then, BP/HR at home have been better controlled. She is still very  weak with surgery and disappointed that her ambulation is not progressing very quickly.  F/u in afib clinic, 7/23. She remains in rate controlled afib. Has regained a lot of her strength from hip surgery and walked in clinic today vrs riding in a w/c. She is back to playing golf. Again asks today re options to get back in SR. She also has LLE possibly 2/2 cardizem.  Pt was seen by Dr. Percival Spanish and it was decided to try amiodarone to restore sinus rhythm for her exertional shortness of breath. She is now in the afib clinic, 9/28, to get set up for cardioversion, having been loading on amiodarone x one month.No missed doses of Eliquis for at least 3 weeks.  Today, she denies symptoms of  chest pain,  orthopnea, PND,  dizziness, presyncope, syncope, or neurologic sequela.   Positive for exertional shortness of breath, LEE left worse than rt, rt leg discomfort. The patient is tolerating medications without difficulties and is otherwise without complaint today.   Past Medical History:  Diagnosis Date  . Arthritis   . Bradycardia 2014  . Cataract   . Diverticulosis   . Dyspnea    periodically  . Dysrhythmia    a - fib  . Heart murmur    ??? bruit  . History  of stress test    a. 04/09/13-nuclear stress-no ischemia low risk  . Hypertension   . Menopause   . Osteoporosis   . PAF (paroxysmal atrial fibrillation) ([place])    a. s/p prior DCCV; b. On flecainide & coumadin (CHA2DS2VASc = 4);  c. 03/2016 Echo: EF 55-60%, mod LVH, mod AI, mild to mod TR, PASP 9mmHg.  Marland Kitchen Pelvic fracture [facility])    Past Surgical History:  Procedure Laterality Date  . BIOPSY BREAST    . BIOPSY BREAST     right & benign  . BREAST SURGERY    . CARDIOVERSION N/A 10/30/2016   Procedure: CARDIOVERSION;  Surgeon: Larey Dresser, MD;  Location: [place];  Service: Cardiovascular;  Laterality: N/A;  . CATARACT EXTRACTION    . EYE SURGERY     cataracts  . TEE WITHOUT CARDIOVERSION N/A 10/30/2016   Procedure: TRANSESOPHAGEAL ECHOCARDIOGRAM (TEE);  Surgeon: Larey Dresser, MD;  Location: [place];  Service: Cardiovascular;  Laterality: N/A;  . TOTAL HIP ARTHROPLASTY Right 01/27/2017   Procedure: TOTAL HIP ARTHROPLASTY ANTERIOR APPROACH;  Surgeon: Rod Can, MD;  Location: [place];  Service: Orthopedics;  Laterality: Right;    Current Outpatient Prescriptions  Medication Sig Dispense Refill  . amiodarone (PACERONE) 200 MG tablet Take 200 mg by mouth 2 (two) times daily.     . calcium-vitamin D (OSCAL WITH D) 250-125 MG-UNIT per tablet Take 1 tablet by mouth 2 (two) times daily.    Marland Kitchen diltiazem (CARDIZEM CD) 180 MG 24 hr capsule Take 1 capsule (180 mg total) by mouth daily. 180 capsule 2  . ELIQUIS 2.5 MG TABS tablet TAKE 1 TABLET BY MOUTH 2  TIMES DAILY. PUT ON HOLD  UNTIL PATIENT REQUESTS. 144 tablet 1  . folic acid (FOLVITE) 1 MG tablet Take 1 mg by mouth daily.    . furosemide (LASIX) 20 MG tablet Take 20 mg by mouth.    . gabapentin (NEURONTIN) 100 MG capsule Take 100 mg by mouth daily as needed.    Marland Kitchen lisinopril (PRINIVIL,ZESTRIL) 40 MG tablet TAKE 1 TABLET BY MOUTH  DAILY 90 tablet 1  . methotrexate (RHEUMATREX) 2.5 MG tablet Take 10 mg by mouth once  a week.     .  Multiple Vitamin (MULTIVITAMIN) capsule Take 1 capsule by mouth daily.    Marland Kitchen omeprazole (PRILOSEC) 20 MG capsule     . pindolol (VISKEN) 10 MG tablet TAKE 2 TABLETS BY MOUTH  EVERY MORNING AND 1-1/2  TABLETS EVERY EVENING 315 tablet 0  . potassium chloride SA (K-DUR,KLOR-CON) 20 MEQ tablet TAKE 1 TABLET BY MOUTH  DAILY 90 tablet 1   No current facility-administered medications for this encounter.     Allergies  Allergen Reactions  . Benazepril Hcl Cough    Social History   Social History  . Marital status: Married    Spouse name: N/A  . Number of children: N/A  . Years of education: N/A   Occupational History  . Not on file.   Social History Main Topics  . Smoking status: Never Smoker  . Smokeless tobacco: Never Used  . Alcohol use No  . Drug use: No  . Sexual activity: Yes   Other Topics Concern  . Not on file   Social History Narrative  . No narrative on file    Family History  Problem Relation Age of Onset  . Stroke Mother        cerebral hemorrhage  . Heart disease Father        MI  . Heart attack Father   . Vision loss Father   . Heart disease Brother   . Heart attack Brother   . Cancer Maternal Aunt        breast  . Cancer Brother   . Heart disease Brother   . Cancer Daughter        breast    ROS- All systems are reviewed and negative except as per the HPI above  Physical Exam: Vitals:   08/15/17 1128  BP: (!) 160/82  Pulse: 80  Weight: 130 lb 12.8 oz (59.3 kg)  Height: 5\' 1"  (1.549 m)    GEN- The patient is well appearing, alert and oriented x 3 today.   Head- normocephalic, atraumatic Eyes-  Sclera clear, conjunctiva pink Ears- hearing intact Oropharynx- clear Neck- supple, no JVP Lymph- no cervical lymphadenopathy Lungs- Clear to ausculation bilaterally, normal work of breathing Heart-  irregular rate and rhythm, no murmurs, rubs or gallops, PMI not laterally displaced GI- soft, NT, ND, + BS Extremities- no clubbing, cyanosis, or  mild LLE, rt greater than left, with compression socks on. MS- no significant deformity or atrophy Skin- no rash or lesion Psych- euthymic mood, full affect Neuro- strength and sensation are intact  EKG- afib at 84 bpm, qrs int 86 ms, qtc 430 ms Epic records reviewed Echo- 03/18/16 LV EF: 55% -   60%  ------------------------------------------------------------------- Indications:      SOB (R06.00).  ------------------------------------------------------------------- Study Conclusions  - Left ventricle: The cavity size was normal. Wall thickness was   increased in a pattern of moderate LVH. Systolic function was   normal. The estimated ejection fraction was in the range of 55%   to 60%. - Aortic valve: There was moderate regurgitation. - Tricuspid valve: There was mild-moderate regurgitation. - Pulmonary arteries: Systolic pressure was moderately increased.  Assessment and Plan: 1. Persistent afib  Rate controlled and is feeling better since  hip surgery However has issues with continued shortness of breath probably 2/2 afib Has been on amiodarone loading x one month and will schedule for cardioversion, continue 200 mg bid until f/u in one week here and then anticipate lowering dose to 200  mg daily Continue  Cardizem 180 mg qd   Continue pindolol but take only one tab morning of the day of cardioversion, in case she returns to SR on slow side Continue Eliquis, no missed doses Cmet,cbc, tsh   2. HTN Stable   F/u up in afib clinic one week s/p cardioversion   Butch Penny C. Mckenzey Parcell, [facility] [address] [place],  [address] [phone]

## 2017-08-16 LAB — T3, FREE: T3, Free: 2 pg/mL (ref 2.0–4.4)

## 2017-08-19 ENCOUNTER — Telehealth: Payer: Self-pay | Admitting: Family Medicine

## 2017-08-19 NOTE — Telephone Encounter (Signed)
Pt aware of NO samples

## 2017-08-20 ENCOUNTER — Encounter (HOSPITAL_COMMUNITY): Payer: Self-pay | Admitting: *Deleted

## 2017-08-20 ENCOUNTER — Ambulatory Visit (HOSPITAL_COMMUNITY)
Admission: RE | Admit: 2017-08-20 | Discharge: 2017-08-20 | Disposition: A | Discharge status: Home / Self Care | Payer: Medicare Other | Source: Ambulatory Visit | Attending: Cardiology | Admitting: Cardiology

## 2017-08-20 ENCOUNTER — Other Ambulatory Visit: Payer: Self-pay

## 2017-08-20 ENCOUNTER — Encounter (HOSPITAL_COMMUNITY)
Admission: RE | Disposition: A | Discharge status: Home / Self Care | Payer: Self-pay | Source: Ambulatory Visit | Attending: Cardiology

## 2017-08-20 ENCOUNTER — Ambulatory Visit (HOSPITAL_COMMUNITY): Payer: Medicare Other | Admitting: Anesthesiology

## 2017-08-20 DIAGNOSIS — Z79899 Other long term (current) drug therapy: Secondary | ICD-10-CM | POA: Diagnosis not present

## 2017-08-20 DIAGNOSIS — M48062 Spinal stenosis, lumbar region with neurogenic claudication: Secondary | ICD-10-CM | POA: Diagnosis not present

## 2017-08-20 DIAGNOSIS — Z7901 Long term (current) use of anticoagulants: Secondary | ICD-10-CM | POA: Diagnosis not present

## 2017-08-20 DIAGNOSIS — I119 Hypertensive heart disease without heart failure: Secondary | ICD-10-CM | POA: Diagnosis not present

## 2017-08-20 DIAGNOSIS — I481 Persistent atrial fibrillation: Secondary | ICD-10-CM | POA: Insufficient documentation

## 2017-08-20 DIAGNOSIS — I083 Combined rheumatic disorders of mitral, aortic and tricuspid valves: Secondary | ICD-10-CM | POA: Insufficient documentation

## 2017-08-20 DIAGNOSIS — I480 Paroxysmal atrial fibrillation: Secondary | ICD-10-CM | POA: Insufficient documentation

## 2017-08-20 DIAGNOSIS — Z96641 Presence of right artificial hip joint: Secondary | ICD-10-CM | POA: Diagnosis not present

## 2017-08-20 DIAGNOSIS — R0602 Shortness of breath: Secondary | ICD-10-CM | POA: Diagnosis not present

## 2017-08-20 DIAGNOSIS — I10 Essential (primary) hypertension: Secondary | ICD-10-CM | POA: Diagnosis not present

## 2017-08-20 DIAGNOSIS — I482 Chronic atrial fibrillation: Secondary | ICD-10-CM | POA: Diagnosis not present

## 2017-08-20 HISTORY — PX: CARDIOVERSION: SHX1299

## 2017-08-20 SURGERY — CARDIOVERSION
Anesthesia: General

## 2017-08-20 MED ORDER — SODIUM CHLORIDE 0.9 % IV SOLN
INTRAVENOUS | Status: AC | PRN
  Administered 2017-08-20: 500 mL via INTRAVENOUS

## 2017-08-20 MED ORDER — LIDOCAINE 2% (20 MG/ML) 5 ML SYRINGE
INTRAMUSCULAR | Status: DC | PRN
  Administered 2017-08-20: 60 mg via INTRAVENOUS

## 2017-08-20 MED ORDER — PROPOFOL 10 MG/ML IV BOLUS
INTRAVENOUS | Status: DC | PRN
  Administered 2017-08-20: 60 mg via INTRAVENOUS

## 2017-08-20 NOTE — H&P (View-Only) (Signed)
Primary Care Physician: Susan Herb, MD Referring Physician: Ignacia Bayley, NP Cardiologist: Dr. Neomia Fitzgerald Susan Fitzgerald is a 83 y.o. female with a h/o paroxysmal afib that is in the afib clinic for evaluation for recent increase in afib burden. She has  history of atrial fibrillation status post prior cardioversion and subsequent antiarrhythmic therapy with flecainide. She is anticoagulated with Coumadin. She also takes pindolol. Over the years, she has had occasional paroxysms of short-lived A. fib. Earlier this year, she had a period of atrial fibrillation, which is usually associated with dyspnea when it occurs, and she took an additional pindolol and this subsequently broke. She was doing well when she last saw Susan Fitzgerald in June. Since early September however, she has noted more frequent elevated heart rates and irregular heart rhythm. When in A. fib, rates are typically in the mid 90s to low 100s. She records her blood pressures and heart rates on a regular basis and has shown these to me today. Over the past 2 months, she is mostly in the 80s to 90s though occasionally will have rates in the 70s and 60s. She does not think that she has been in atrial fibrillation all of the time over the past 2 months. As above, she does have dyspnea on exertion when in A. fib but denies chest pain, PND, orthopnea, dizziness, syncope, or early satiety. She does have some degree of mild chronic venous stasis and lower extremity swelling and recently taken off amlodipine.  On visit with me, she feels that she has been in afib consistently over the last two weeks. I reviewed her V/S and her heart rate has been elevated closer to 100 bpm. In SR, she has a heart rate closer to 60. Discussion with pt for cardioversion with continuation on flecainide or flecainide washout and change to tikosyn. Pt is also planning to have an epidural Tuesday and will have to stop coumadin tonight. She wants to pursue this prior  to any change in afib management. It was also discussed that coumadin will have to be therapeutic x 3-4 weeks prior to any change in therapy.  Pt returns to afib clinic 11/30, s/p epidural for back pain for which pt had to stop warfarin and as of yet, has become therapeutic since restarting drug. The injection was not that helpful and pt now thinks she may need a hip replacement after the first of the year. Options of  restoring SR were discussed again and she would like to proceed with cardioversion with flecainide to see if flecainide will continue to work for pt. She has had good success with this drug over the years and is not that willing to move on to other options yet. She is wanting to do this as soon as possible so she can then plan for hip surgery. She is aware that she cannot interrupt blood thinner for at least 30 days after cardioversion. If that fails, she would next be interested in [name], if she can afford drug.  She is aware that she will require TEE guided cardioversion since she will not be therapeutic 4 weeks prior to cardioversion.  Returns to afib clinic one week s/p cardioversion. She is in SR but is still having exertional dyspnea. She was hoping that return to SR would improve this. EKG shows more pronounced T waves in the ant/inf leads. She denies exertional chest pain. Her BP was elevated at time of cardioversion and she received IV meds. At  home, it continues to be elevated. It was around 973 systolic on arrival, rechecked at 180/80. She was on 2.5 mg of amlodipine which was stopped in November for mild ankle swelling. She continues to have mild ankle swelling off drug and occasionally worse in rt leg which she will be seeing a orthopedic surgeon re today and may be pending surgery. Gabapentin was added since last visit but pt has not noted too much change in leg discomfort. Qtc is also prolonged today, unsure why, previous ekg's reviewed and qtc was in range. No other new meds to  explain change. She does use prn lasix. BP rechecked at 180/80. Echo in May showed moderate aortic regurg, normal EF with moderate diastolic dysfunction, pulmonary pressure increased at 53 mm Hg.  Return to afib clinic 1/12. She saw Susan Fitzgerald for her ongoing shortness of breath and needing cardiac clearance for her desired back/hip sugery. However, she was not well rate controlled having stopped flecainide for prolonged QTc. She is wanting surgery so will try to rate control to get her thru surgery and address restoring SR at a later date. Susan Fitzgerald started cardizem 30 mg tid which has helped control her heart rate but has contributed to swelling of her left leg, which is the one that is needing surgery. Review of her HR's at [place] indicate that majority of HR is now upper 80's to 90's, occasion low 100's, and would like to see HR's slower in the low 80's's to be in the best shape for surgery. Increased pindolol to 20 mg am and continued 15 mg pm.  She returns today,1/19 with afib poorly controlled, v rates in the 120's, despite increasing BB and CCB recently added. She continues to have LLE which is chronic but slightly more with addition of Cardizem. Review of her history shows  she was on amiodarone at one time but was stopped due to intolerance, possibly due to elevated liver enzymes. Her renal function is calculated  at 40 ml/min, which would probably limit dose of  Tikosyn to 125 mcg to possible 250 mcg bid. She really does not want to come into the hospital. Flecainide was recently stopped due to prolonged qtc and this would be a concern with tikosyn . Sotalol would also be a renal concern since it is contraindicated with renal cl less than 40 ml/min. As persisitent as her afib has been, I do not think that multaq would be effective. She really wants to achieve rate control and be able to have her surgery.  F/u in the afib clinic for a recent ER visit for afib with RVR. Pt recently had her rt hip  replaced and did well during surgery with afib staying rate controlled. However, she has had some issues with ambulating weakness/ pain since being home and last Sunday started having a lot of abdominal discomfort and noted afib with rvr. She was told to go to the ER where her HR was around 150 bpm. She was started on IV dilt and then transitioned to po cardizem. She went home on 360 mg cardizem.  Pt then called a few days after d/c and was c/o of a very low BP at 80 systolic and weakness after taking her am Cardizem dose. HR was controlled. She was advised not to take any more meds that day that would affect BP and after that day to divide the cardizem dose to 180 mg bid.  Since then, BP/HR at home have been better controlled. She is still very  weak with surgery and disappointed that her ambulation is not progressing very quickly.  F/u in afib clinic, 7/23. She remains in rate controlled afib. Has regained a lot of her strength from hip surgery and walked in clinic today vrs riding in a w/c. She is back to playing golf. Again asks today re options to get back in SR. She also has LLE possibly 2/2 cardizem.  Pt was seen by Susan Fitzgerald and it was decided to try amiodarone to restore sinus rhythm for her exertional shortness of breath. She is now in the afib clinic, 9/28, to get set up for cardioversion, having been loading on amiodarone x one month.No missed doses of Eliquis for at least 3 weeks.  Today, she denies symptoms of  chest pain,  orthopnea, PND,  dizziness, presyncope, syncope, or neurologic sequela.   Positive for exertional shortness of breath, LEE left worse than rt, rt leg discomfort. The patient is tolerating medications without difficulties and is otherwise without complaint today.   Past Medical History:  Diagnosis Date  . Arthritis   . Bradycardia 2014  . Cataract   . Diverticulosis   . Dyspnea    periodically  . Dysrhythmia    a - fib  . Heart murmur    ??? bruit  . History  of stress test    a. 04/09/13-nuclear stress-no ischemia low risk  . Hypertension   . Menopause   . Osteoporosis   . PAF (paroxysmal atrial fibrillation) ([place])    a. s/p prior DCCV; b. On flecainide & coumadin (CHA2DS2VASc = 4);  c. 03/2016 Echo: EF 55-60%, mod LVH, mod AI, mild to mod TR, PASP 60mmHg.  Marland Kitchen Pelvic fracture [facility])    Past Surgical History:  Procedure Laterality Date  . BIOPSY BREAST    . BIOPSY BREAST     right & benign  . BREAST SURGERY    . CARDIOVERSION N/A 10/30/2016   Procedure: CARDIOVERSION;  Surgeon: Larey Dresser, MD;  Location: [place];  Service: Cardiovascular;  Laterality: N/A;  . CATARACT EXTRACTION    . EYE SURGERY     cataracts  . TEE WITHOUT CARDIOVERSION N/A 10/30/2016   Procedure: TRANSESOPHAGEAL ECHOCARDIOGRAM (TEE);  Surgeon: Larey Dresser, MD;  Location: [place];  Service: Cardiovascular;  Laterality: N/A;  . TOTAL HIP ARTHROPLASTY Right 01/27/2017   Procedure: TOTAL HIP ARTHROPLASTY ANTERIOR APPROACH;  Surgeon: Rod Can, MD;  Location: [place];  Service: Orthopedics;  Laterality: Right;    Current Outpatient Prescriptions  Medication Sig Dispense Refill  . amiodarone (PACERONE) 200 MG tablet Take 200 mg by mouth 2 (two) times daily.     . calcium-vitamin D (OSCAL WITH D) 250-125 MG-UNIT per tablet Take 1 tablet by mouth 2 (two) times daily.    Marland Kitchen diltiazem (CARDIZEM CD) 180 MG 24 hr capsule Take 1 capsule (180 mg total) by mouth daily. 180 capsule 2  . ELIQUIS 2.5 MG TABS tablet TAKE 1 TABLET BY MOUTH 2  TIMES DAILY. PUT ON HOLD  UNTIL PATIENT REQUESTS. 841 tablet 1  . folic acid (FOLVITE) 1 MG tablet Take 1 mg by mouth daily.    . furosemide (LASIX) 20 MG tablet Take 20 mg by mouth.    . gabapentin (NEURONTIN) 100 MG capsule Take 100 mg by mouth daily as needed.    Marland Kitchen lisinopril (PRINIVIL,ZESTRIL) 40 MG tablet TAKE 1 TABLET BY MOUTH  DAILY 90 tablet 1  . methotrexate (RHEUMATREX) 2.5 MG tablet Take 10 mg by mouth once  a week.     .  Multiple Vitamin (MULTIVITAMIN) capsule Take 1 capsule by mouth daily.    Marland Kitchen omeprazole (PRILOSEC) 20 MG capsule     . pindolol (VISKEN) 10 MG tablet TAKE 2 TABLETS BY MOUTH  EVERY MORNING AND 1-1/2  TABLETS EVERY EVENING 315 tablet 0  . potassium chloride SA (K-DUR,KLOR-CON) 20 MEQ tablet TAKE 1 TABLET BY MOUTH  DAILY 90 tablet 1   No current facility-administered medications for this encounter.     Allergies  Allergen Reactions  . Benazepril Hcl Cough    Social History   Social History  . Marital status: Married    Spouse name: N/A  . Number of children: N/A  . Years of education: N/A   Occupational History  . Not on file.   Social History Main Topics  . Smoking status: Never Smoker  . Smokeless tobacco: Never Used  . Alcohol use No  . Drug use: No  . Sexual activity: Yes   Other Topics Concern  . Not on file   Social History Narrative  . No narrative on file    Family History  Problem Relation Age of Onset  . Stroke Mother        cerebral hemorrhage  . Heart disease Father        MI  . Heart attack Father   . Vision loss Father   . Heart disease Brother   . Heart attack Brother   . Cancer Maternal Aunt        breast  . Cancer Brother   . Heart disease Brother   . Cancer Daughter        breast    ROS- All systems are reviewed and negative except as per the HPI above  Physical Exam: Vitals:   08/15/17 1128  BP: (!) 160/82  Pulse: 80  Weight: 130 lb 12.8 oz (59.3 kg)  Height: 5\' 1"  (1.549 m)    GEN- The patient is well appearing, alert and oriented x 3 today.   Head- normocephalic, atraumatic Eyes-  Sclera clear, conjunctiva pink Ears- hearing intact Oropharynx- clear Neck- supple, no JVP Lymph- no cervical lymphadenopathy Lungs- Clear to ausculation bilaterally, normal work of breathing Heart-  irregular rate and rhythm, no murmurs, rubs or gallops, PMI not laterally displaced GI- soft, NT, ND, + BS Extremities- no clubbing, cyanosis, or  mild LLE, rt greater than left, with compression socks on. MS- no significant deformity or atrophy Skin- no rash or lesion Psych- euthymic mood, full affect Neuro- strength and sensation are intact  EKG- afib at 84 bpm, qrs int 86 ms, qtc 430 ms Epic records reviewed Echo- 03/18/16 LV EF: 55% -   60%  ------------------------------------------------------------------- Indications:      SOB (R06.00).  ------------------------------------------------------------------- Study Conclusions  - Left ventricle: The cavity size was normal. Wall thickness was   increased in a pattern of moderate LVH. Systolic function was   normal. The estimated ejection fraction was in the range of 55%   to 60%. - Aortic valve: There was moderate regurgitation. - Tricuspid valve: There was mild-moderate regurgitation. - Pulmonary arteries: Systolic pressure was moderately increased.  Assessment and Plan: 1. Persistent afib  Rate controlled and is feeling better since  hip surgery However has issues with continued shortness of breath probably 2/2 afib Has been on amiodarone loading x one month and will schedule for cardioversion, continue 200 mg bid until f/u in one week here and then anticipate lowering dose to 200  mg daily Continue  Cardizem 180 mg qd   Continue pindolol but take only one tab morning of the day of cardioversion, in case she returns to SR on slow side Continue Eliquis, no missed doses Cmet,cbc, tsh   2. HTN Stable   F/u up in afib clinic one week s/p cardioversion   Butch Penny C. Carroll, [facility] [address] [place], [place] [address] [phone]

## 2017-08-20 NOTE — Discharge Instructions (Signed)
Electrical Cardioversion Electrical cardioversion is the delivery of a jolt of electricity to restore a normal rhythm to the heart. A rhythm that is too fast or is not regular keeps the heart from pumping well. In this procedure, sticky patches or metal paddles are placed on the chest to deliver electricity to the heart from a device. This procedure may be done in an emergency if:  There is low or no blood pressure as a result of the heart rhythm.  Normal rhythm must be restored as fast as possible to protect the brain and heart from further damage.  It may save a life.  This procedure may also be done for irregular or fast heart rhythms that are not immediately life-threatening. Tell a health care provider about:  Any allergies you have.  All medicines you are taking, including vitamins, herbs, eye drops, creams, and over-the-counter medicines.  Any problems you or family members have had with anesthetic medicines.  Any blood disorders you have.  Any surgeries you have had.  Any medical conditions you have.  Whether you are pregnant or may be pregnant. What are the risks? Generally, this is a safe procedure. However, problems may occur, including:  Allergic reactions to medicines.  A blood clot that breaks free and travels to other parts of your body.  The possible return of an abnormal heart rhythm within hours or days after the procedure.  Your heart stopping (cardiac arrest). This is rare.  What happens before the procedure? Medicines  Your health care provider may have you start taking: ? Blood-thinning medicines (anticoagulants) so your blood does not clot as easily. ? Medicines may be given to help stabilize your heart rate and rhythm.  Ask your health care provider about changing or stopping your regular medicines. This is especially important if you are taking diabetes medicines or blood thinners. General instructions  Plan to have someone take you home from  the hospital or clinic.  If you will be going home right after the procedure, plan to have someone with you for 24 hours.  Follow instructions from your health care provider about eating or drinking restrictions. What happens during the procedure?  To lower your risk of infection: ? Your health care team will wash or sanitize their hands. ? Your skin will be washed with soap.  An IV tube will be inserted into one of your veins.  You will be given a medicine to help you relax (sedative).  Sticky patches (electrodes) or metal paddles may be placed on your chest.  An electrical shock will be delivered. The procedure may vary among health care providers and hospitals. What happens after the procedure?  Your blood pressure, heart rate, breathing rate, and blood oxygen level will be monitored until the medicines you were given have worn off.  Do not drive for 24 hours if you were given a sedative.  Your heart rhythm will be watched to make sure it does not change. This information is not intended to replace advice given to you by your health care provider. Make sure you discuss any questions you have with your health care provider. Document Released: 10/25/2002 Document Revised: 07/03/2016 Document Reviewed: 05/10/2016 Elsevier Interactive Patient Education  2017 [employer].

## 2017-08-20 NOTE — CV Procedure (Signed)
    Electrical Cardioversion Procedure Note OPHIE BURROWES [id number] [birth year]  Procedure: Electrical Cardioversion Indications:  Atrial Fibrillation  Time Out: Verified patient identification, verified procedure,medications/allergies/relevent history reviewed, required imaging and test results available.  Performed  Procedure Details  The patient was NPO after midnight. Anesthesia was administered at the beside  by Dr. Mertha Baars with 60mg  of propofol.  Cardioversion was performed with synchronized biphasic defibrillation via AP pads with 120 joules.  1 attempt(s) were performed.  The patient converted to normal sinus rhythm. The patient tolerated the procedure well   IMPRESSION:  Successful cardioversion of atrial fibrillation    Candee Furbish 08/20/2017, 11:41 AM

## 2017-08-20 NOTE — Interval H&P Note (Signed)
History and Physical Interval Note:  08/20/2017 11:32 AM  Susan Fitzgerald  has presented today for surgery, with the diagnosis of AFIB  The various methods of treatment have been discussed with the patient and family. After consideration of risks, benefits and other options for treatment, the patient has consented to  Procedure(s): CARDIOVERSION (N/A) as a surgical intervention .  The patient's history has been reviewed, patient examined, no change in status, stable for surgery.  I have reviewed the patient's chart and labs.  Questions were answered to the patient's satisfaction.     [employer]

## 2017-08-20 NOTE — Transfer of Care (Signed)
Immediate Anesthesia Transfer of Care Note  Patient: Susan Fitzgerald  Procedure(s) Performed: CARDIOVERSION (N/A )  Patient Location: Endoscopy Unit  Anesthesia Type:General  Level of Consciousness: awake, oriented and patient cooperative  Airway & Oxygen Therapy: Patient Spontanous Breathing and Patient connected to nasal cannula oxygen  Post-op Assessment: Report given to RN, Post -op Vital signs reviewed and stable and Patient moving all extremities  Post vital signs: Reviewed and stable  Last Vitals:  Vitals:   08/20/17 0932  BP: (!) 195/94  Resp: 17  Temp: 36.9 C  SpO2: 98%    Last Pain:  Vitals:   08/20/17 0932  TempSrc: Oral         Complications: No apparent anesthesia complications

## 2017-08-20 NOTE — Anesthesia Preprocedure Evaluation (Addendum)
Anesthesia Evaluation  Patient identified by MRN, date of birth, ID band Patient awake    Reviewed: NPO status , Patient's Chart, lab work & pertinent test results, reviewed documented beta blocker date and time   Airway Mallampati: II   Neck ROM: Full    Dental no notable dental hx. (+) Dental Advisory Given   Pulmonary shortness of breath and with exertion,    breath sounds clear to auscultation       Cardiovascular hypertension, Pt. on medications + dysrhythmias Atrial Fibrillation + Valvular Problems/Murmurs AI and MR  Rhythm:Irregular Rate:Normal + Systolic murmurs Nuc Stress 06/2017 -  Nuclear stress EF: 51%. There was no ST segment deviation noted during stress. The study is normal. This is a low risk study. The left ventricular ejection fraction is mildly decreased (45-54%).  TTE 01/2017 - Left ventricle: The cavity size was normal. There was mild concentric hypertrophy. Systolic function was mildly reduced. The  estimated ejection fraction was in the range of 45% to 50%. Mild,  diffuse hypokinesis. - Aortic valve: There was moderate regurgitation.   - Mitral valve: There was mild regurgitation. - Right ventricle: The cavity size was mildly dilated. Wall   thickness was normal. Systolic function was normal. - Tricuspid valve: There was moderate-severe regurgitation.  EKG - Afib, LAFB, QTc 491   Neuro/Psych negative neurological ROS  negative psych ROS   GI/Hepatic negative GI ROS, Neg liver ROS,   Endo/Other  negative endocrine ROS  Renal/GU negative Renal ROS     Musculoskeletal  (+) Arthritis ,   Abdominal Normal abdominal exam  (+)   Peds  Hematology negative hematology ROS (+)   Anesthesia Other Findings    Reproductive/Obstetrics                           Anesthesia Physical  Anesthesia Plan  ASA: III  Anesthesia Plan: General   Post-op Pain Management:     Induction:   PONV Risk Score and Plan: Treatment may vary due to age or medical condition and Propofol infusion  Airway Management Planned: Natural Airway and Simple Face Mask  Additional Equipment: None  Intra-op Plan:   Post-operative Plan:   Informed Consent: I have reviewed the patients History and Physical, chart, labs and discussed the procedure including the risks, benefits and alternatives for the proposed anesthesia with the patient or authorized representative who has indicated his/her understanding and acceptance.   Dental advisory given  Plan Discussed with: CRNA  Anesthesia Plan Comments:       Anesthesia Quick Evaluation

## 2017-08-21 ENCOUNTER — Encounter (HOSPITAL_COMMUNITY): Payer: Self-pay | Admitting: Cardiology

## 2017-08-21 NOTE — Anesthesia Postprocedure Evaluation (Signed)
Anesthesia Post Note  Patient: Susan Fitzgerald  Procedure(s) Performed: CARDIOVERSION (N/A )     Patient location during evaluation: PACU Anesthesia Type: General Level of consciousness: awake and alert Pain management: pain level controlled Vital Signs Assessment: post-procedure vital signs reviewed and stable Respiratory status: spontaneous breathing, nonlabored ventilation, respiratory function stable and patient connected to nasal cannula oxygen Cardiovascular status: blood pressure returned to baseline and stable Postop Assessment: no apparent nausea or vomiting Anesthetic complications: no    Last Vitals:  Vitals:   08/20/17 1205 08/20/17 1215  BP: (!) 187/67 (!) 189/62  Pulse: 61 62  Resp: 15 12  Temp:    SpO2: 96% 95%    Last Pain:  Vitals:   08/20/17 1155  TempSrc: Oral   Pain Goal:                 Mao Lockner S

## 2017-08-25 ENCOUNTER — Ambulatory Visit (HOSPITAL_COMMUNITY)
Admission: RE | Admit: 2017-08-25 | Discharge: 2017-08-25 | Disposition: A | Discharge status: Home / Self Care | Payer: Medicare Other | Source: Ambulatory Visit | Attending: Nurse Practitioner | Admitting: Nurse Practitioner

## 2017-08-25 ENCOUNTER — Encounter (HOSPITAL_COMMUNITY): Payer: Self-pay | Admitting: Nurse Practitioner

## 2017-08-25 VITALS — BP 180/82 | HR 65 | Ht 61.0 in | Wt 130.0 lb

## 2017-08-25 DIAGNOSIS — Z8249 Family history of ischemic heart disease and other diseases of the circulatory system: Secondary | ICD-10-CM | POA: Insufficient documentation

## 2017-08-25 DIAGNOSIS — Z9889 Other specified postprocedural states: Secondary | ICD-10-CM | POA: Diagnosis not present

## 2017-08-25 DIAGNOSIS — Z803 Family history of malignant neoplasm of breast: Secondary | ICD-10-CM | POA: Insufficient documentation

## 2017-08-25 DIAGNOSIS — I481 Persistent atrial fibrillation: Secondary | ICD-10-CM | POA: Diagnosis not present

## 2017-08-25 DIAGNOSIS — I10 Essential (primary) hypertension: Secondary | ICD-10-CM | POA: Insufficient documentation

## 2017-08-25 DIAGNOSIS — Z79899 Other long term (current) drug therapy: Secondary | ICD-10-CM | POA: Diagnosis not present

## 2017-08-25 DIAGNOSIS — Z823 Family history of stroke: Secondary | ICD-10-CM | POA: Insufficient documentation

## 2017-08-25 DIAGNOSIS — I4819 Other persistent atrial fibrillation: Secondary | ICD-10-CM

## 2017-08-25 MED ORDER — PINDOLOL 10 MG PO TABS: ORAL_TABLET | ORAL | 0 refills | Status: DC

## 2017-08-25 NOTE — Patient Instructions (Signed)
Decrease Amiodarone to 200mg  once a day  Dr. Percival Spanish in 3 months.

## 2017-08-25 NOTE — Progress Notes (Signed)
Primary Care Physician: Chipper Herb, MD Referring Physician:Dr. Neomia Dear Susan Fitzgerald is a 83 y.o. female with a h/o persistent afib that recently underwent amiodarone loading and successful cardioversion. She is here today for one week f/u. She is feeling better in SR with noticeable improvement in shortness of breath. She will now reduce amiodarone to 200 mg 1 a day. Last TSH was elevated with normal T3/T4. She will have to have this rechecked in about 4-6 weeks, seeing if on lower dose of amiodarone, TSH normalizes. She continues on eliquis 2.5 mg bid for chadsvasc score of at least 4.  BP elevated, rechecked and unchanged. Meds taken about one hour ago. States some degree of white coat hypertension.Sinus rhythm at 65 bpm,   Today, she denies symptoms of palpitations, chest pain, shortness of breath, orthopnea, PND, lower extremity edema, dizziness, presyncope, syncope, or neurologic sequela. The patient is tolerating medications without difficulties and is otherwise without complaint today.   Past Medical History:  Diagnosis Date  . Arthritis   . Bradycardia 2014  . Cataract   . Diverticulosis   . Dyspnea    periodically  . Dysrhythmia    a - fib  . Heart murmur    ??? bruit  . History of stress test    a. 04/09/13-nuclear stress-no ischemia low risk  . Hypertension   . Menopause   . Osteoporosis   . PAF (paroxysmal atrial fibrillation) ([place])    a. s/p prior DCCV; b. On flecainide & coumadin (CHA2DS2VASc = 4);  c. 03/2016 Echo: EF 55-60%, mod LVH, mod AI, mild to mod TR, PASP 48mmHg.  Marland Kitchen Pelvic fracture [facility])    Past Surgical History:  Procedure Laterality Date  . BIOPSY BREAST    . BIOPSY BREAST     right & benign  . BREAST SURGERY    . CARDIOVERSION N/A 10/30/2016   Procedure: CARDIOVERSION;  Surgeon: Larey Dresser, MD;  Location: [place];  Service: Cardiovascular;  Laterality: N/A;  . CARDIOVERSION N/A 08/20/2017   Procedure: CARDIOVERSION;  Surgeon:  Jerline Pain, MD;  Location: [facility] ENDOSCOPY;  Service: Cardiovascular;  Laterality: N/A;  . CATARACT EXTRACTION    . EYE SURGERY     cataracts  . TEE WITHOUT CARDIOVERSION N/A 10/30/2016   Procedure: TRANSESOPHAGEAL ECHOCARDIOGRAM (TEE);  Surgeon: Larey Dresser, MD;  Location: [place];  Service: Cardiovascular;  Laterality: N/A;  . TOTAL HIP ARTHROPLASTY Right 01/27/2017   Procedure: TOTAL HIP ARTHROPLASTY ANTERIOR APPROACH;  Surgeon: Rod Can, MD;  Location: [employer];  Service: Orthopedics;  Laterality: Right;    Current Outpatient Prescriptions  Medication Sig Dispense Refill  . amiodarone (PACERONE) 200 MG tablet Take 200 mg by mouth daily.    . calcium-vitamin D (OSCAL WITH D) 250-125 MG-UNIT per tablet Take 1 tablet by mouth 2 (two) times daily.    Marland Kitchen diltiazem (CARDIZEM CD) 180 MG 24 hr capsule Take 1 capsule (180 mg total) by mouth daily. 180 capsule 2  . ELIQUIS 2.5 MG TABS tablet TAKE 1 TABLET BY MOUTH 2  TIMES DAILY. PUT ON HOLD  UNTIL PATIENT REQUESTS. 683 tablet 1  . folic acid (FOLVITE) 1 MG tablet Take 1 mg by mouth daily.    . furosemide (LASIX) 20 MG tablet Take 20 mg by mouth.    . gabapentin (NEURONTIN) 100 MG capsule Take 100 mg by mouth daily as needed.    Marland Kitchen lisinopril (PRINIVIL,ZESTRIL) 40 MG tablet TAKE 1 TABLET BY MOUTH  DAILY 90 tablet 1  . methotrexate (RHEUMATREX) 2.5 MG tablet Take 10 mg by mouth once a week.     . Multiple Vitamin (MULTIVITAMIN) capsule Take 1 capsule by mouth daily.    Marland Kitchen omeprazole (PRILOSEC) 20 MG capsule     . pindolol (VISKEN) 10 MG tablet TAKE 2 TABLETS BY MOUTH  EVERY MORNING AND 1-1/2  TABLETS EVERY EVENING 315 tablet 0  . potassium chloride SA (K-DUR,KLOR-CON) 20 MEQ tablet TAKE 1 TABLET BY MOUTH  DAILY 90 tablet 1   No current facility-administered medications for this encounter.     Allergies  Allergen Reactions  . Benazepril Hcl Cough    Social History   Social History  . Marital status: Married    Spouse name: N/A    . Number of children: N/A  . Years of education: N/A   Occupational History  . Not on file.   Social History Main Topics  . Smoking status: Never Smoker  . Smokeless tobacco: Never Used  . Alcohol use No  . Drug use: No  . Sexual activity: Yes   Other Topics Concern  . Not on file   Social History Narrative  . No narrative on file    Family History  Problem Relation Age of Onset  . Stroke Mother        cerebral hemorrhage  . Heart disease Father        MI  . Heart attack Father   . Vision loss Father   . Heart disease Brother   . Heart attack Brother   . Cancer Maternal Aunt        breast  . Cancer Brother   . Heart disease Brother   . Cancer Daughter        breast    ROS- All systems are reviewed and negative except as per the HPI above  Physical Exam: Vitals:   08/25/17 0924  BP: (!) 180/82  Pulse: 65  Weight: 130 lb (59 kg)  Height: 5\' 1"  (1.549 m)   Wt Readings from Last 3 Encounters:  08/25/17 130 lb (59 kg)  08/20/17 130 lb (59 kg)  08/15/17 130 lb 12.8 oz (59.3 kg)    Labs: Lab Results  Component Value Date   NA 140 08/15/2017   K 3.6 08/15/2017   CL 105 08/15/2017   CO2 28 08/15/2017   GLUCOSE 77 08/15/2017   BUN 18 08/15/2017   CREATININE 1.11 (H) 08/15/2017   CALCIUM 8.9 08/15/2017   MG 1.8 02/09/2017   Lab Results  Component Value Date   INR 1.9 (H) 03/20/2017   Lab Results  Component Value Date   CHOL 169 03/24/2017   HDL 77 03/24/2017   LDLCALC 90 08/28/2015   TRIG 55 03/24/2017     GEN- The patient is well appearing, alert and oriented x 3 today.   Head- normocephalic, atraumatic Eyes-  Sclera clear, conjunctiva pink Ears- hearing intact Oropharynx- clear Neck- supple, no JVP Lymph- no cervical lymphadenopathy Lungs- Clear to ausculation bilaterally, normal work of breathing Heart- Regular rate and rhythm, no murmurs, rubs or gallops, PMI not laterally displaced GI- soft, NT, ND, + BS Extremities- no clubbing,  cyanosis, or edema MS- no significant deformity or atrophy Skin- no rash or lesion Psych- euthymic mood, full affect Neuro- strength and sensation are intact  EKG- SR with first degree AV block pr int 218 ms, qrs int 90 ms, qtc 453 ms Epic records reviewed    Assessment and Plan:  1. Persistent afib Successful cardioversion 10/3 Staying in SR with much less shortness of breath Decrease amiodarone to 200 mg a day Will need thyroid studies repeated when she sees her PCP 11/2, previous elevated TSH/ normal T3/T4 Continue eliquis 2.5 mg bid without missed doses, chadsvasc score of at least 4    2. HTN Labile Just took BP meds in the last hour Will recheck on return to home Continue  current BP meds without change Avoid salt  F/u with Dr. Percival Spanish in 3 months, pt prefers in [place] clinic Afib clinic as needed  [place]. Jakaden Ouzts, [facility] [address] [place], [place] [address] [phone]

## 2017-08-26 ENCOUNTER — Telehealth: Payer: Self-pay | Admitting: Cardiology

## 2017-08-26 NOTE — Telephone Encounter (Signed)
Patient has been made aware that we are currently out of samples of Eliquis. We will call her when some become available.

## 2017-08-26 NOTE — Telephone Encounter (Signed)
New Message  Patient calling the office for samples of medication:   1.  What medication and dosage are you requesting samples for? Eliquis 2.5mg   2.  Are you currently out of this medication? yes

## 2017-09-01 DIAGNOSIS — M150 Primary generalized (osteo)arthritis: Secondary | ICD-10-CM | POA: Diagnosis not present

## 2017-09-01 DIAGNOSIS — M0579 Rheumatoid arthritis with rheumatoid factor of multiple sites without organ or systems involvement: Secondary | ICD-10-CM | POA: Diagnosis not present

## 2017-09-01 DIAGNOSIS — M5030 Other cervical disc degeneration, unspecified cervical region: Secondary | ICD-10-CM | POA: Diagnosis not present

## 2017-09-01 DIAGNOSIS — M5136 Other intervertebral disc degeneration, lumbar region: Secondary | ICD-10-CM | POA: Diagnosis not present

## 2017-09-02 ENCOUNTER — Other Ambulatory Visit (HOSPITAL_COMMUNITY): Payer: Self-pay | Admitting: Nurse Practitioner

## 2017-09-02 NOTE — Telephone Encounter (Signed)
Still no samples

## 2017-09-04 NOTE — Telephone Encounter (Signed)
Eliquis 2.5 mg #3 Lo # [id number] exp 12/19 Left detailed message, ok per [facility]

## 2017-09-08 ENCOUNTER — Other Ambulatory Visit: Payer: Self-pay | Admitting: *Deleted

## 2017-09-08 ENCOUNTER — Telehealth: Payer: Self-pay | Admitting: Family Medicine

## 2017-09-08 DIAGNOSIS — E559 Vitamin D deficiency, unspecified: Secondary | ICD-10-CM

## 2017-09-08 DIAGNOSIS — R7989 Other specified abnormal findings of blood chemistry: Secondary | ICD-10-CM

## 2017-09-08 DIAGNOSIS — I10 Essential (primary) hypertension: Secondary | ICD-10-CM

## 2017-09-08 DIAGNOSIS — I482 Chronic atrial fibrillation, unspecified: Secondary | ICD-10-CM

## 2017-09-08 NOTE — Telephone Encounter (Signed)
Patient aware labs are placed

## 2017-09-15 ENCOUNTER — Other Ambulatory Visit: Payer: Self-pay | Admitting: Family Medicine

## 2017-09-15 ENCOUNTER — Other Ambulatory Visit: Payer: Medicare Other

## 2017-09-15 DIAGNOSIS — E559 Vitamin D deficiency, unspecified: Secondary | ICD-10-CM | POA: Diagnosis not present

## 2017-09-15 DIAGNOSIS — R7989 Other specified abnormal findings of blood chemistry: Secondary | ICD-10-CM | POA: Diagnosis not present

## 2017-09-15 DIAGNOSIS — I10 Essential (primary) hypertension: Secondary | ICD-10-CM | POA: Diagnosis not present

## 2017-09-16 LAB — THYROID PANEL WITH TSH
FREE THYROXINE INDEX: 1.4 (ref 1.2–4.9)
T3 UPTAKE RATIO: 25 % (ref 24–39)
T4 TOTAL: 5.5 ug/dL (ref 4.5–12.0)
TSH: 19.1 u[IU]/mL — AB (ref 0.450–4.500)

## 2017-09-16 LAB — BMP8+EGFR
BUN/Creatinine Ratio: 20 (ref 12–28)
BUN: 19 mg/dL (ref 8–27)
CHLORIDE: 104 mmol/L (ref 96–106)
CO2: 26 mmol/L (ref 20–29)
CREATININE: 0.96 mg/dL (ref 0.57–1.00)
Calcium: 9.2 mg/dL (ref 8.7–10.3)
GFR calc Af Amer: 63 mL/min/{1.73_m2} (ref >59)
GFR calc non Af Amer: 55 mL/min/{1.73_m2} — ABNORMAL LOW (ref >59)
GLUCOSE: 82 mg/dL (ref 65–99)
POTASSIUM: 3.8 mmol/L (ref 3.5–5.2)
SODIUM: 144 mmol/L (ref 134–144)

## 2017-09-16 LAB — NMR, LIPOPROFILE
Cholesterol: 166 mg/dL (ref 100–199)
HDL CHOLESTEROL BY NMR: 71 mg/dL (ref >39)
HDL Particle Number: 30 umol/L — ABNORMAL LOW (ref >=30.5)
LDL PARTICLE NUMBER: 879 nmol/L (ref <1000)
LDL Size: 21.3 nm (ref >20.5)
LDL-C: 82 mg/dL (ref 0–99)
LP-IR Score: <25 (ref <=45)
Small LDL Particle Number: <90 nmol/L (ref <=527)
TRIGLYCERIDES BY NMR: 67 mg/dL (ref 0–149)

## 2017-09-16 LAB — HEPATIC FUNCTION PANEL
ALBUMIN: 3.8 g/dL (ref 3.5–4.7)
ALK PHOS: 92 IU/L (ref 39–117)
ALT: 35 IU/L — ABNORMAL HIGH (ref 0–32)
AST: 42 IU/L — ABNORMAL HIGH (ref 0–40)
Bilirubin Total: 0.4 mg/dL (ref 0.0–1.2)
Bilirubin, Direct: 0.09 mg/dL (ref 0.00–0.40)
TOTAL PROTEIN: 6.5 g/dL (ref 6.0–8.5)

## 2017-09-16 LAB — CBC WITH DIFFERENTIAL/PLATELET
BASOS ABS: 0 10*3/uL (ref 0.0–0.2)
Basos: 1 %
EOS (ABSOLUTE): 0.1 10*3/uL (ref 0.0–0.4)
Eos: 3 %
HEMOGLOBIN: 12.9 g/dL (ref 11.1–15.9)
Hematocrit: 40 % (ref 34.0–46.6)
Immature Grans (Abs): 0 10*3/uL (ref 0.0–0.1)
Immature Granulocytes: 0 %
LYMPHS ABS: 1.2 10*3/uL (ref 0.7–3.1)
Lymphs: 28 %
MCH: 30.5 pg (ref 26.6–33.0)
MCHC: 32.3 g/dL (ref 31.5–35.7)
MCV: 95 fL (ref 79–97)
Monocytes Absolute: 0.5 10*3/uL (ref 0.1–0.9)
Monocytes: 12 %
Neutrophils Absolute: 2.4 10*3/uL (ref 1.4–7.0)
Neutrophils: 56 %
Platelets: 187 10*3/uL (ref 150–379)
RBC: 4.23 x10E6/uL (ref 3.77–5.28)
RDW: 16.5 % — ABNORMAL HIGH (ref 12.3–15.4)
WBC: 4.2 10*3/uL (ref 3.4–10.8)

## 2017-09-16 LAB — VITAMIN D 25 HYDROXY (VIT D DEFICIENCY, FRACTURES): Vit D, 25-Hydroxy: 37.5 ng/mL (ref 30.0–100.0)

## 2017-09-18 ENCOUNTER — Other Ambulatory Visit: Payer: Self-pay

## 2017-09-18 DIAGNOSIS — I10 Essential (primary) hypertension: Secondary | ICD-10-CM

## 2017-09-18 DIAGNOSIS — E039 Hypothyroidism, unspecified: Secondary | ICD-10-CM | POA: Insufficient documentation

## 2017-09-18 MED ORDER — LEVOTHYROXINE SODIUM 50 MCG PO TABS: 50.0000 ug | ORAL_TABLET | Freq: Every day | ORAL | 1 refills | Status: DC

## 2017-09-19 ENCOUNTER — Ambulatory Visit (INDEPENDENT_AMBULATORY_CARE_PROVIDER_SITE_OTHER): Payer: Medicare Other | Admitting: Family Medicine

## 2017-09-19 ENCOUNTER — Encounter: Payer: Self-pay | Admitting: Family Medicine

## 2017-09-19 VITALS — BP 158/76 | HR 60 | Temp 98.5°F | Ht 60.75 in | Wt 128.0 lb

## 2017-09-19 DIAGNOSIS — E034 Atrophy of thyroid (acquired): Secondary | ICD-10-CM | POA: Diagnosis not present

## 2017-09-19 DIAGNOSIS — E559 Vitamin D deficiency, unspecified: Secondary | ICD-10-CM | POA: Diagnosis not present

## 2017-09-19 DIAGNOSIS — I10 Essential (primary) hypertension: Secondary | ICD-10-CM | POA: Diagnosis not present

## 2017-09-19 DIAGNOSIS — Z23 Encounter for immunization: Secondary | ICD-10-CM

## 2017-09-19 DIAGNOSIS — K219 Gastro-esophageal reflux disease without esophagitis: Secondary | ICD-10-CM | POA: Diagnosis not present

## 2017-09-19 DIAGNOSIS — I482 Chronic atrial fibrillation, unspecified: Secondary | ICD-10-CM

## 2017-09-19 NOTE — Progress Notes (Signed)
Subjective:    Patient ID: Susan Fitzgerald, female    DOB: 1931/12/23, 83 y.o.   MRN: [id number]  HPI Pt here for follow up and management of chronic medical problems which includes a fib, hypertension and gerd. She is taking medication regularly.  The patient is doing well today with no specific complaints.  Her blood pressure is elevated at 158/75.  She is due to return in FOBT and has had lab work done which will be reviewing with her during the office today.  All cholesterol numbers with advanced lipid testing were good and at goal with an LDL particle number being 879 and an LDL C being 82.  Triglycerides are good at 67.  The HDL particle number the good cholesterol was low.  The blood sugar was good at 82.  The creatinine, the most important kidney function test was normal and all of the electrolytes including potassium are good.  2 liver function tests were slightly elevated but minimally so and we will continue to monitor this and would recommend that she recheck the liver function test in about 6 weeks.  The vitamin D level was good at 37.5 and she will continue with current treatment .  The TSH was elevated and more than it was one month ago meaning that she needs to start on some thyroid medication for hypothyroidism.  We will discussed this with her during the visit and start her on levothyroxine at 50 mcg daily.  We will ask her to recheck her thyroid test in about 6 weeks.  The CBC was within normal limits with a normal white count good hemoglobin at 12.9 and an adequate platelet count.  This patient has ongoing problems with atrial fibrillation and rheumatoid arthritis.  She has had the right hip replaced because of avascular necrosis.  She had a CT scan of the chest in March of this year and this was reviewed with her during the visit today and she was given a copy of this report.  This showed no evidence of a pulmonary embolus and she had mild interstitial prominence at both lung bases with  some right heart enlargement and some scattered coronary artery calcifications.  There was a chronic compression deformity at T11.  The patient continues to see the cardiologist and the rheumatologist regularly.  Her prescription for her levothyroxine has already been called in and she has not started it at this point time.  She will have a repeat thyroid profile in about 6 weeks.    Patient Active Problem List   Diagnosis Date Noted  . Hypothyroidism 09/18/2017  . SOB (shortness of breath) 07/13/2017  . Spinal stenosis of lumbar region with neurogenic claudication 03/20/2017  . Atrial fibrillation with rapid ventricular response ([place]) 02/10/2017  . Atrial fibrillation ([place]) 02/10/2017  . Avascular necrosis of hip, right ([place]) 01/27/2017  . Avascular necrosis of bone of right hip ([place]) 01/27/2017  . Abdominal aortic atherosclerosis ([place]) 09/24/2016  . Rheumatoid arthritis ([place]) 05/07/2016  . Symptomatic bradycardia 06/02/2013  . Hypertension 04/21/2013  . Osteoporosis 04/21/2013  . Atrial fibrillation, chronic ([place]) 02/09/2013   Outpatient Encounter Prescriptions as of 09/19/2017  Medication Sig  . amiodarone (PACERONE) 200 MG tablet Take 200 mg by mouth daily.  . calcium-vitamin D (OSCAL WITH D) 250-125 MG-UNIT per tablet Take 1 tablet by mouth 2 (two) times daily.  Marland Kitchen diltiazem (CARDIZEM CD) 180 MG 24 hr capsule Take 1 capsule (180 mg total) by mouth daily.  Marland Kitchen ELIQUIS 2.5  MG TABS tablet TAKE 1 TABLET BY MOUTH 2  TIMES DAILY. PUT ON HOLD  UNTIL PATIENT REQUESTS.  . folic acid (FOLVITE) 1 MG tablet Take 1 mg by mouth daily.  . furosemide (LASIX) 20 MG tablet Take 20 mg by mouth.  . gabapentin (NEURONTIN) 100 MG capsule Take 100 mg by mouth daily as needed.  Marland Kitchen lisinopril (PRINIVIL,ZESTRIL) 40 MG tablet TAKE 1 TABLET BY MOUTH  DAILY  . methotrexate (RHEUMATREX) 2.5 MG tablet Take 10 mg by mouth once a week.   . Multiple Vitamin (MULTIVITAMIN) capsule Take 1 capsule by mouth daily.  Marland Kitchen  omeprazole (PRILOSEC) 20 MG capsule   . pindolol (VISKEN) 10 MG tablet Take 2 tablets (20mg ) by mouth in the morning and 1 and 1/2 tablets (15mg ) in the evening  . potassium chloride SA (K-DUR,KLOR-CON) 20 MEQ tablet TAKE 1 TABLET BY MOUTH  DAILY  . levothyroxine (SYNTHROID, LEVOTHROID) 50 MCG tablet Take 1 tablet (50 mcg total) by mouth daily. (Patient not taking: Reported on 09/19/2017)   No facility-administered encounter medications on file as of 09/19/2017.       Review of Systems  Constitutional: Negative.   HENT: Negative.   Eyes: Negative.   Respiratory: Negative.   Cardiovascular: Negative.   Gastrointestinal: Negative.   Endocrine: Negative.   Genitourinary: Negative.   Musculoskeletal: Negative.   Skin: Negative.   Allergic/Immunologic: Negative.   Neurological: Negative.        A little off-balance at times  Hematological: Negative.   Psychiatric/Behavioral: Negative.        Objective:   Physical Exam  Constitutional: She is oriented to person, place, and time. She appears well-developed and well-nourished. No distress.  The patient is pleasant and alert  HENT:  Head: Normocephalic and atraumatic.  Right Ear: External ear normal.  Left Ear: External ear normal.  Nose: Nose normal.  Mouth/Throat: Oropharynx is clear and moist.  Eyes: Pupils are equal, round, and reactive to light. Conjunctivae and EOM are normal. Right eye exhibits no discharge. Left eye exhibits no discharge. No scleral icterus.  Neck: Normal range of motion. Neck supple. No thyromegaly present.  No bruits thyromegaly or anterior cervical adenopathy  Cardiovascular: Normal rate, regular rhythm, normal heart sounds and intact distal pulses.   No murmur heard. Heart has a regular rate and rhythm at 60/min  Pulmonary/Chest: Effort normal and breath sounds normal. No respiratory distress. She has no wheezes. She has no rales.  Clear anteriorly and posteriorly  Abdominal: Soft. Bowel sounds are  normal. She exhibits no mass. There is no tenderness. There is no rebound and no guarding.  No abdominal tenderness masses bruits or organ enlargement  Musculoskeletal: Normal range of motion. She exhibits no edema.  Lymphadenopathy:    She has no cervical adenopathy.  Neurological: She is alert and oriented to person, place, and time. She has normal reflexes. No cranial nerve deficit.  Skin: Skin is warm and dry. No rash noted.  Psychiatric: She has a normal mood and affect. Her behavior is normal. Judgment and thought content normal.  Nursing note and vitals reviewed.  BP (!) 158/75 (BP Location: Left Arm)   Pulse 60   Temp 98.5 F (36.9 C) (Oral)   Ht 5\' 1"  (1.549 m)   Wt 128 lb (58.1 kg)   BMI 24.19 kg/m         Assessment & Plan:  1. Essential hypertension -Blood pressure is slightly elevated today.  She will bring readings back in a couple  weeks and if it remains elevated we may increase her pindolol to 20 mg twice daily at that time.  She will also watch her sodium intake more closely.  2. Atrial fibrillation, chronic ([place]) -She did have an ablation recently about 1 month ago and has had much more regular heartbeat since that time and she has felt better.  3. Gastroesophageal reflux disease, esophagitis presence not specified -No complaints today with reflux disease  4. Vitamin D deficiency -Continue with vitamin D replacement as currently doing  5. Hypothyroidism due to acquired atrophy of thyroid -Start levothyroxine 50 mcg 1 daily.  For the first 8 days she will take 1/2 pill and then go up to a whole one daily and we will recheck her thyroid profile in 6 weeks.  Patient Instructions                       Medicare Annual Wellness Visit  [place] and the medical providers at [facility] strive to bring you the best medical care.  In doing so we not only want to address your current medical conditions and concerns but also to detect new  conditions early and prevent illness, disease and health-related problems.    Medicare offers a yearly Wellness Visit which allows our clinical staff to assess your need for preventative services including immunizations, lifestyle education, counseling to decrease risk of preventable diseases and screening for fall risk and other medical concerns.    This visit is provided free of charge (no copay) for all Medicare recipients. The clinical pharmacists at [facility] have begun to conduct these Wellness Visits which will also include a thorough review of all your medications.    As you primary medical provider recommend that you make an appointment for your Annual Wellness Visit if you have not done so already this year.  You may set up this appointment before you leave today or you may call back ([phone]) and schedule an appointment.  Please make sure when you call that you mention that you are scheduling your Annual Wellness Visit with the clinical pharmacist so that the appointment may be made for the proper length of time.     Continue current medications. Continue good therapeutic lifestyle changes which include good diet and exercise. Fall precautions discussed with patient. If an FOBT was given today- please return it to our front desk. If you are over 71 years old - you may need Prevnar 85 or the adult Pneumonia vaccine.  **Flu shots are available--- please call and schedule a FLU-CLINIC appointment**  After your visit with [place] today you will receive a survey in the mail or online from [employer] regarding your care with [place]. Please take a moment to fill this out. Your feedback is very important to [place] as you can help [place] better understand your patient needs as well as improve your experience and satisfaction. WE CARE ABOUT YOU!!!   Continue to follow-up with cardiology Start thyroid replacement medicine as directed and take 1/2-1 daily for 8 days and then increase it  to a whole one which is 50 mcg. Recheck thyroid profile in 6-7 weeks. We may have to increase the thyroid medicine at that time again. Check blood pressure readings over the next couple weeks and bring these readings in for review.  We may have to increase the pindolol up to 20 mg twice daily at that time. Watch sodium intake  Arrie Senate MD

## 2017-09-19 NOTE — Patient Instructions (Addendum)
Medicare Annual Wellness Visit  [place] and the medical providers at [facility] strive to bring you the best medical care.  In doing so we not only want to address your current medical conditions and concerns but also to detect new conditions early and prevent illness, disease and health-related problems.    Medicare offers a yearly Wellness Visit which allows our clinical staff to assess your need for preventative services including immunizations, lifestyle education, counseling to decrease risk of preventable diseases and screening for fall risk and other medical concerns.    This visit is provided free of charge (no copay) for all Medicare recipients. The clinical pharmacists at [facility] have begun to conduct these Wellness Visits which will also include a thorough review of all your medications.    As you primary medical provider recommend that you make an appointment for your Annual Wellness Visit if you have not done so already this year.  You may set up this appointment before you leave today or you may call back ([phone]) and schedule an appointment.  Please make sure when you call that you mention that you are scheduling your Annual Wellness Visit with the clinical pharmacist so that the appointment may be made for the proper length of time.     Continue current medications. Continue good therapeutic lifestyle changes which include good diet and exercise. Fall precautions discussed with patient. If an FOBT was given today- please return it to our front desk. If you are over 19 years old - you may need Prevnar 46 or the adult Pneumonia vaccine.  **Flu shots are available--- please call and schedule a FLU-CLINIC appointment**  After your visit with [place] today you will receive a survey in the mail or online from [employer] regarding your care with [place]. Please take a moment to fill this out. Your feedback is very  important to [place] as you can help [place] better understand your patient needs as well as improve your experience and satisfaction. WE CARE ABOUT YOU!!!   Continue to follow-up with cardiology Start thyroid replacement medicine as directed and take 1/2-1 daily for 8 days and then increase it to a whole one which is 50 mcg. Recheck thyroid profile in 6-7 weeks. We may have to increase the thyroid medicine at that time again. Check blood pressure readings over the next couple weeks and bring these readings in for review.  We may have to increase the pindolol up to 20 mg twice daily at that time. Watch sodium intake

## 2017-09-26 ENCOUNTER — Telehealth: Payer: Self-pay | Admitting: Family Medicine

## 2017-09-26 NOTE — Telephone Encounter (Signed)
Pt  Bring by readings next week and they we will discuss the possible  change in med

## 2017-09-29 ENCOUNTER — Telehealth (HOSPITAL_COMMUNITY): Payer: Self-pay | Admitting: *Deleted

## 2017-09-29 MED ORDER — PINDOLOL 10 MG PO TABS: 20.0000 mg | ORAL_TABLET | Freq: Two times a day (BID) | ORAL | 2 refills | Status: DC

## 2017-09-29 NOTE — Telephone Encounter (Signed)
Pt called in concerned regarding blood pressure readings at home with pindolol 20mg  am and 15mg  pm -- readings still running 150/60 with HR in the 60s. Discussed with Roderic Palau NP will try increasing pindolol to 20mg  BID but carefully watch HR. If notice HR dipping into 40-50s will need to try different agent for BP that does not effect HRs. Pt will keep watch and call if issues.

## 2017-09-30 ENCOUNTER — Other Ambulatory Visit: Payer: Medicare Other

## 2017-09-30 DIAGNOSIS — Z1212 Encounter for screening for malignant neoplasm of rectum: Secondary | ICD-10-CM

## 2017-09-30 NOTE — Addendum Note (Signed)
Addended by: Liliane Bade on: 09/30/2017 05:17 PM   Modules accepted: Orders

## 2017-10-01 LAB — FECAL OCCULT BLOOD, IMMUNOCHEMICAL: FECAL OCCULT BLD: NEGATIVE

## 2017-10-29 ENCOUNTER — Other Ambulatory Visit: Payer: Self-pay | Admitting: Family Medicine

## 2017-10-30 ENCOUNTER — Other Ambulatory Visit: Payer: Medicare Other

## 2017-10-30 DIAGNOSIS — E039 Hypothyroidism, unspecified: Secondary | ICD-10-CM

## 2017-10-30 DIAGNOSIS — E7800 Pure hypercholesterolemia, unspecified: Secondary | ICD-10-CM | POA: Diagnosis not present

## 2017-10-31 ENCOUNTER — Other Ambulatory Visit: Payer: Self-pay

## 2017-10-31 DIAGNOSIS — E034 Atrophy of thyroid (acquired): Secondary | ICD-10-CM

## 2017-10-31 LAB — HEPATIC FUNCTION PANEL
ALT: 31 IU/L (ref 0–32)
AST: 41 IU/L — AB (ref 0–40)
Albumin: 3.9 g/dL (ref 3.5–4.7)
Alkaline Phosphatase: 94 IU/L (ref 39–117)
BILIRUBIN TOTAL: 0.4 mg/dL (ref 0.0–1.2)
Bilirubin, Direct: 0.11 mg/dL (ref 0.00–0.40)
Total Protein: 6.4 g/dL (ref 6.0–8.5)

## 2017-10-31 LAB — THYROID PANEL WITH TSH
FREE THYROXINE INDEX: 2.7 (ref 1.2–4.9)
T3 UPTAKE RATIO: 28 % (ref 24–39)
T4 TOTAL: 9.6 ug/dL (ref 4.5–12.0)
TSH: 6.46 u[IU]/mL — AB (ref 0.450–4.500)

## 2017-10-31 MED ORDER — LEVOTHYROXINE SODIUM 75 MCG PO TABS
75.0000 ug | ORAL_TABLET | Freq: Every day | ORAL | 0 refills | Status: DC
Start: 2017-10-31 — End: 2017-12-02

## 2017-11-04 ENCOUNTER — Telehealth: Payer: Self-pay | Admitting: Cardiology

## 2017-11-04 NOTE — Telephone Encounter (Signed)
New message      Patient calling the office for samples of medication:   1.  What medication and dosage are you requesting samples for? eliquis 2.5 or 5  2.  Are you currently out of this medication? Yes and she is in the donut hole

## 2017-11-04 NOTE — Telephone Encounter (Signed)
Medication Samples have been provided to the patient.  Drug name: Eliquis 2.5mg Qty: 28LOT: [id number].Date: 12/19  The patient has been instructed regarding the correct time, dose, and frequency of taking this medication, including desired effects and most common side effects.

## 2017-11-24 ENCOUNTER — Telehealth: Payer: Self-pay | Admitting: Family Medicine

## 2017-11-24 NOTE — Telephone Encounter (Signed)
Spoke with patient and she is aware that she will need to have thyroid repeated. She is aware that she will need to have drawn between 4-6 weeks. She requested that hepatic function be faxed to [facility] and labs were faxed. She is going to contact [facility]'s office and see if they are going to need any other labs when she see's  Amil Amen so she can have drawn at the time we recheck her thyroid.

## 2017-11-30 NOTE — Progress Notes (Signed)
HPI The patient presents for evaluation of atrial fibrillation. She was cardioverted.  However, she developed recurrent atrial fib.  She had her flecainide does reduced because of a prolonged QT interval.  The flecainide was subsequently discontinued as she had reverted to atrial fib.   She has been followed in the atrial fib clinic.  She did describe chest pain and was sent for [facility] which did not suggest ischemia.  She does have a mildly reduced EF (40 - 45% on echo.)   She was started on amiodarone and underwent DCCV and was in NSR at the last Atrial Fib Clinic appt.   Since cardioversion she feels better.  She is having no SOB and does not feel palpitations.  She has no chest pain or neck pain.  She was having SOB walking to the mailbox but is not having this now.  Her heart rates been in the 50s and 60s.  Interestingly when I checked the EKG she is back in fibrillation.     Allergies  Allergen Reactions  . Benazepril Hcl Cough    Current Outpatient Medications  Medication Sig Dispense Refill  . amiodarone (PACERONE) 200 MG tablet Take 200 mg by mouth daily.    . calcium-vitamin D (OSCAL WITH D) 250-125 MG-UNIT per tablet Take 1 tablet by mouth 2 (two) times daily.    Marland Kitchen diltiazem (CARDIZEM CD) 180 MG 24 hr capsule Take 1 capsule (180 mg total) by mouth daily. 180 capsule 2  . ELIQUIS 2.5 MG TABS tablet TAKE 1 TABLET BY MOUTH 2  TIMES DAILY. PUT ON HOLD  UNTIL PATIENT REQUESTS. 176 tablet 1  . folic acid (FOLVITE) 1 MG tablet Take 1 mg by mouth daily.    . furosemide (LASIX) 20 MG tablet TAKE 1 TO 2 TABLETS BY  MOUTH DAILY AS NEEDED 180 tablet 0  . gabapentin (NEURONTIN) 100 MG capsule Take 100 mg by mouth daily as needed.    Marland Kitchen levothyroxine (SYNTHROID, LEVOTHROID) 75 MCG tablet Take 1 tablet (75 mcg total) by mouth daily. 90 tablet 1  . lisinopril (PRINIVIL,ZESTRIL) 40 MG tablet TAKE 1 TABLET BY MOUTH  DAILY 90 tablet 0  . Melatonin 5 MG TABS Take 5 mg by mouth at bedtime.      . methotrexate (RHEUMATREX) 2.5 MG tablet Take 10 mg by mouth once a week.     . Multiple Vitamin (MULTIVITAMIN) capsule Take 1 capsule by mouth daily.    Marland Kitchen omeprazole (PRILOSEC) 20 MG capsule     . pindolol (VISKEN) 10 MG tablet Take 2 tablets (20 mg total) 2 (two) times daily by mouth. 360 tablet 2  . potassium chloride SA (K-DUR,KLOR-CON) 20 MEQ tablet TAKE 1 TABLET BY MOUTH  DAILY 90 tablet 0  . spironolactone (ALDACTONE) 25 MG tablet Take 1 tablet (25 mg total) by mouth daily. 90 tablet 3   No current facility-administered medications for this visit.     Past Medical History:  Diagnosis Date  . Arthritis   . Bradycardia 2014  . Cataract   . Diverticulosis   . Dyspnea    periodically  . Dysrhythmia    a - fib  . Heart murmur    ??? bruit  . History of stress test    a. 04/09/13-nuclear stress-no ischemia low risk  . Hypertension   . Menopause   . Osteoporosis   . PAF (paroxysmal atrial fibrillation) ([place])    a. s/p prior DCCV; b. On flecainide & coumadin (CHA2DS2VASc =  4);  c. 03/2016 Echo: EF 55-60%, mod LVH, mod AI, mild to mod TR, PASP 75mmHg.  Marland Kitchen Pelvic fracture [facility])     Past Surgical History:  Procedure Laterality Date  . BIOPSY BREAST    . BIOPSY BREAST     right & benign  . BREAST SURGERY    . CARDIOVERSION N/A 10/30/2016   Procedure: CARDIOVERSION;  Surgeon: Larey Dresser, MD;  Location: [place];  Service: Cardiovascular;  Laterality: N/A;  . CARDIOVERSION N/A 08/20/2017   Procedure: CARDIOVERSION;  Surgeon: Jerline Pain, MD;  Location: [facility] ENDOSCOPY;  Service: Cardiovascular;  Laterality: N/A;  . CATARACT EXTRACTION    . EYE SURGERY     cataracts  . TEE WITHOUT CARDIOVERSION N/A 10/30/2016   Procedure: TRANSESOPHAGEAL ECHOCARDIOGRAM (TEE);  Surgeon: Larey Dresser, MD;  Location: [place];  Service: Cardiovascular;  Laterality: N/A;  . TOTAL HIP ARTHROPLASTY Right 01/27/2017   Procedure: TOTAL HIP ARTHROPLASTY ANTERIOR APPROACH;  Surgeon: Rod Can, MD;  Location: [place];  Service: Orthopedics;  Laterality: Right;    ROS:  As stated in the HPI and negative for all other systems.   PHYSICAL EXAM BP (!) 183/82   Pulse (!) 58   Ht 5\' 2"  (1.575 m)   Wt 129 lb (58.5 kg)   BMI 23.59 kg/m   GENERAL:  Well appearing NECK:  No jugular venous distention, waveform within normal limits, carotid upstroke brisk and symmetric, no bruits, no thyromegaly LUNGS:  Clear to auscultation bilaterally CHEST:  Unremarkable HEART:  PMI not displaced or sustained,S1 and S2 within normal limits, no S3, no S4, no clicks, no rubs,  2/6 brief apical systolic murmur, no diastolic murmurs ABD:  Flat, positive bowel sounds normal in frequency in pitch, no bruits, no rebound, no guarding, no midline pulsatile mass, no hepatomegaly, no splenomegaly EXT:  2 plus pulses throughout, trace edema, no cyanosis no clubbing   EKG:  Atrial fibrillation, 60, left axis deviation, poor anterior R wave progression, no acute ST-T wave changes.  12/03/2017   Lab Results  Component Value Date   TSH 1.820 12/01/2017   ALT 20 12/01/2017   AST 32 12/01/2017   ALKPHOS 103 12/01/2017   BILITOT 0.3 12/01/2017   PROT 7.0 12/01/2017   ALBUMIN 3.9 12/01/2017     ASSESSMENT AND PLAN  HTN:   Is elevated.  I am going to start spironolactone.  She will need a BMET in one week and in 1 month after starting this and she will keep her BP diary.  I reviewed this for this visit.   ATRIAL FIB:   [name] has a CHA2DS2 - VASc score of 4 with a risk of stroke of 4%.  Now on amiodarone.  She is up to date with labs as above.  Despite the fact that she is back in fibrillation she really does not feel right.  He feels somewhat better on the amiodarone but I am going to go as it is obviously contributing to rate control.  TR:  I will follow this clinically.

## 2017-12-01 ENCOUNTER — Other Ambulatory Visit: Payer: Medicare Other

## 2017-12-01 ENCOUNTER — Other Ambulatory Visit: Payer: Self-pay | Admitting: Family Medicine

## 2017-12-01 DIAGNOSIS — R7989 Other specified abnormal findings of blood chemistry: Secondary | ICD-10-CM

## 2017-12-01 DIAGNOSIS — R945 Abnormal results of liver function studies: Secondary | ICD-10-CM

## 2017-12-01 DIAGNOSIS — E034 Atrophy of thyroid (acquired): Secondary | ICD-10-CM | POA: Diagnosis not present

## 2017-12-02 ENCOUNTER — Other Ambulatory Visit: Payer: Self-pay | Admitting: *Deleted

## 2017-12-02 LAB — THYROID PANEL WITH TSH
FREE THYROXINE INDEX: 3.5 (ref 1.2–4.9)
T3 Uptake Ratio: 29 % (ref 24–39)
T4 TOTAL: 12 ug/dL (ref 4.5–12.0)
TSH: 1.82 u[IU]/mL (ref 0.450–4.500)

## 2017-12-02 LAB — HEPATIC FUNCTION PANEL
ALT: 20 IU/L (ref 0–32)
AST: 32 IU/L (ref 0–40)
Albumin: 3.9 g/dL (ref 3.5–4.7)
Alkaline Phosphatase: 103 IU/L (ref 39–117)
BILIRUBIN TOTAL: 0.3 mg/dL (ref 0.0–1.2)
BILIRUBIN, DIRECT: 0.11 mg/dL (ref 0.00–0.40)
Total Protein: 7 g/dL (ref 6.0–8.5)

## 2017-12-02 MED ORDER — LEVOTHYROXINE SODIUM 75 MCG PO TABS: 75.0000 ug | ORAL_TABLET | Freq: Every day | ORAL | 1 refills | Status: DC

## 2017-12-03 ENCOUNTER — Ambulatory Visit: Payer: Medicare Other | Admitting: Cardiology

## 2017-12-03 ENCOUNTER — Encounter: Payer: Self-pay | Admitting: Cardiology

## 2017-12-03 VITALS — BP 183/82 | HR 58 | Ht 62.0 in | Wt 129.0 lb

## 2017-12-03 DIAGNOSIS — I482 Chronic atrial fibrillation, unspecified: Secondary | ICD-10-CM

## 2017-12-03 DIAGNOSIS — I10 Essential (primary) hypertension: Secondary | ICD-10-CM

## 2017-12-03 DIAGNOSIS — Z79899 Other long term (current) drug therapy: Secondary | ICD-10-CM

## 2017-12-03 MED ORDER — SPIRONOLACTONE 25 MG PO TABS: 25.0000 mg | ORAL_TABLET | Freq: Every day | ORAL | 3 refills | Status: DC

## 2017-12-03 NOTE — Progress Notes (Signed)
This patient will need a BMP in approximately 1 week and a copy will need to be sent to Dr. Percival Spanish.

## 2017-12-03 NOTE — Patient Instructions (Signed)
Medication Instructions:  Please start Spironolactone 25 mg once a day. Continue all other medications as listed.  Labwork: Please have blood work 1 week after starting Spironolactone and again 1 month after taking it.   Follow-Up: Follow up in 2 months with Dr Percival Spanish in [place].  If you need a refill on your cardiac medications before your next appointment, please call your pharmacy.  Thank you for choosing [facility]!!

## 2017-12-03 NOTE — Progress Notes (Signed)
Called pt she is aware

## 2017-12-05 ENCOUNTER — Telehealth: Payer: Self-pay | Admitting: Family Medicine

## 2017-12-05 NOTE — Telephone Encounter (Signed)
Aware.   Copy faxed.

## 2017-12-08 ENCOUNTER — Other Ambulatory Visit: Payer: Self-pay | Admitting: Cardiology

## 2017-12-08 DIAGNOSIS — M5136 Other intervertebral disc degeneration, lumbar region: Secondary | ICD-10-CM | POA: Diagnosis not present

## 2017-12-08 DIAGNOSIS — M0579 Rheumatoid arthritis with rheumatoid factor of multiple sites without organ or systems involvement: Secondary | ICD-10-CM | POA: Diagnosis not present

## 2017-12-08 DIAGNOSIS — M5030 Other cervical disc degeneration, unspecified cervical region: Secondary | ICD-10-CM | POA: Diagnosis not present

## 2017-12-08 DIAGNOSIS — M150 Primary generalized (osteo)arthritis: Secondary | ICD-10-CM | POA: Diagnosis not present

## 2017-12-08 NOTE — Telephone Encounter (Signed)
Patient calling the office for samples of medication:   1.  What medication and dosage are you requesting samples for? Eliquis  2.  Are you currently out of this medication? yes    

## 2017-12-08 NOTE — Telephone Encounter (Signed)
Patient directly notified. Samples placed up front for patient.

## 2017-12-10 ENCOUNTER — Telehealth: Payer: Self-pay | Admitting: Family Medicine

## 2017-12-10 NOTE — Telephone Encounter (Signed)
Per labwork placed by Dr Percival Spanish she needs BMP done once she has taken spironlactone for 1 week. Patient states that she started yesterday. I advised patient to wait one week and then come to office for blood draw. Appt made

## 2017-12-15 ENCOUNTER — Telehealth: Payer: Self-pay | Admitting: *Deleted

## 2017-12-15 ENCOUNTER — Telehealth: Payer: Self-pay | Admitting: Cardiology

## 2017-12-15 ENCOUNTER — Ambulatory Visit: Payer: Medicare Other | Admitting: Cardiovascular Disease

## 2017-12-15 ENCOUNTER — Encounter: Payer: Self-pay | Admitting: Cardiovascular Disease

## 2017-12-15 VITALS — BP 128/60 | HR 60 | Ht 62.0 in | Wt 126.4 lb

## 2017-12-15 DIAGNOSIS — I10 Essential (primary) hypertension: Secondary | ICD-10-CM | POA: Diagnosis not present

## 2017-12-15 DIAGNOSIS — I481 Persistent atrial fibrillation: Secondary | ICD-10-CM

## 2017-12-15 DIAGNOSIS — I4819 Other persistent atrial fibrillation: Secondary | ICD-10-CM

## 2017-12-15 DIAGNOSIS — Z5181 Encounter for therapeutic drug level monitoring: Secondary | ICD-10-CM

## 2017-12-15 DIAGNOSIS — L820 Inflamed seborrheic keratosis: Secondary | ICD-10-CM | POA: Diagnosis not present

## 2017-12-15 DIAGNOSIS — L570 Actinic keratosis: Secondary | ICD-10-CM | POA: Diagnosis not present

## 2017-12-15 MED ORDER — DILTIAZEM HCL ER COATED BEADS 120 MG PO CP24: 120.0000 mg | ORAL_CAPSULE | Freq: Every day | ORAL | 5 refills | Status: DC

## 2017-12-15 NOTE — Patient Instructions (Signed)
Medication Instructions:  DECREASE YOUR DILTITAZEM TO 120 MG DAILY   Labwork: NONE  Testing/Procedures: NONE  Follow-Up: Your physician recommends that you schedule a follow-up appointment in: [place] [facility]   If you need a refill on your cardiac medications before your next appointment, please call your pharmacy.

## 2017-12-15 NOTE — Telephone Encounter (Signed)
New message  Patient calling stating she's at [employer] if she can be seen today if possible.    Pt C/O Shortness Of Breath: STAT if SOB developed within the last 24 hours or pt is noticeably SOB on the phone  1. Are you currently SOB (can you hear that pt is SOB on the phone)? No   2. How long have you been experiencing SOB? 3-4 days progressively worse   3. Are you SOB when sitting or when up moving around? Moving around    4. Are you currently experiencing any other symptoms? No

## 2017-12-15 NOTE — Telephone Encounter (Signed)
Patient came in as a walk in with increased shortness of breath. Heart rate was 42. Per Dr. Percival Spanish, he would like for the patient to be seen today. Dr. Oval Linsey has agreed to see her. An appointment has been made for 1:40.

## 2017-12-15 NOTE — Progress Notes (Signed)
Cardiology Office Note   Date:  12/15/2017   ID:  [name], DOB 1932-05-25, MRN [id number]  PCP:  Chipper Herb, MD  Cardiologist:  Marijo File, MD  Chief Complaint  Patient presents with  . Shortness of Breath      History of Present Illness: Susan Fitzgerald is a 84 y.o. female with paroxysmal atrial fibrillation, hypertension who presents for an acute care visit due to shortness of breath.  [name] has a history of paroxysmal atrial fibrillation.  She underwen TEE and cardioversion in 2017 and DCCV 08/2017.  She has previously failed flecainide and is currently on amiodarone.  Flecainide was reduced due to prolonged QT interval.  She is reported chest pain in the past and had a Lexiscan Myoview 06/2017 that revealed LVEF 51% and no ischemia.  Echo 01/2017 revealed LVEF 45-50% with mild LVH and moderate aortic regurgitation.  She last saw Dr. Percival Spanish 12/03/16 and was in atrial fibrillation with well-controlled rates.  Amiodarone was continued for rate control.  She was started on Spironolactone due to poorly-controlled hypertension.  Since making that change her blood pressure has been much better-controlled.  Her heart rate is typically been in the 50s-60s.  Today she was eating at the [employer] when she developed increased shortness of breath.  She decided to come be evaluated in our office.  She has progressive shortness of breath over the last couple weeks.  She is okay at rest but gets short of breath with exertion.  When she was first evaluated her heart rate was noted to be 42 bpm on pulse oximetry.  She denies palpitations, chest pain, lightheadedness, or dizziness.  She has no lower extremity edema lately which she attributes to wearing her compression stockings.  She denies orthopnea or PND but reports that she has been sleeping in a recliner for years ever since her hip surgery.   Past Medical History:  Diagnosis Date  . Arthritis   . Bradycardia 2014  . Cataract   .  Diverticulosis   . Dyspnea    periodically  . Dysrhythmia    a - fib  . Heart murmur    ??? bruit  . History of stress test    a. 04/09/13-nuclear stress-no ischemia low risk  . Hypertension   . Menopause   . Osteoporosis   . PAF (paroxysmal atrial fibrillation) ([place])    a. s/p prior DCCV; b. On flecainide & coumadin (CHA2DS2VASc = 4);  c. 03/2016 Echo: EF 55-60%, mod LVH, mod AI, mild to mod TR, PASP 7mmHg.  Marland Kitchen Pelvic fracture [facility])     Past Surgical History:  Procedure Laterality Date  . BIOPSY BREAST    . BIOPSY BREAST     right & benign  . BREAST SURGERY    . CARDIOVERSION N/A 10/30/2016   Procedure: CARDIOVERSION;  Surgeon: Larey Dresser, MD;  Location: [place];  Service: Cardiovascular;  Laterality: N/A;  . CARDIOVERSION N/A 08/20/2017   Procedure: CARDIOVERSION;  Surgeon: Jerline Pain, MD;  Location: [facility] ENDOSCOPY;  Service: Cardiovascular;  Laterality: N/A;  . CATARACT EXTRACTION    . EYE SURGERY     cataracts  . TEE WITHOUT CARDIOVERSION N/A 10/30/2016   Procedure: TRANSESOPHAGEAL ECHOCARDIOGRAM (TEE);  Surgeon: Larey Dresser, MD;  Location: [place];  Service: Cardiovascular;  Laterality: N/A;  . TOTAL HIP ARTHROPLASTY Right 01/27/2017   Procedure: TOTAL HIP ARTHROPLASTY ANTERIOR APPROACH;  Surgeon: Rod Can, MD;  Location: [place];  Service:  Orthopedics;  Laterality: Right;     Current Outpatient Medications  Medication Sig Dispense Refill  . amiodarone (PACERONE) 200 MG tablet Take 200 mg by mouth daily.    . calcium-vitamin D (OSCAL WITH D) 250-125 MG-UNIT per tablet Take 1 tablet by mouth 2 (two) times daily.    Marland Kitchen diltiazem (CARDIZEM CD) 120 MG 24 hr capsule Take 1 capsule (120 mg total) by mouth daily. 30 capsule 5  . ELIQUIS 2.5 MG TABS tablet TAKE 1 TABLET BY MOUTH 2  TIMES DAILY. PUT ON HOLD  UNTIL PATIENT REQUESTS. 161 tablet 1  . folic acid (FOLVITE) 1 MG tablet Take 1 mg by mouth daily.    . furosemide (LASIX) 20 MG tablet TAKE 1 TO 2  TABLETS BY  MOUTH DAILY AS NEEDED 180 tablet 0  . gabapentin (NEURONTIN) 100 MG capsule Take 100 mg by mouth daily as needed.    Marland Kitchen levothyroxine (SYNTHROID, LEVOTHROID) 75 MCG tablet Take 1 tablet (75 mcg total) by mouth daily. 90 tablet 1  . lisinopril (PRINIVIL,ZESTRIL) 40 MG tablet TAKE 1 TABLET BY MOUTH  DAILY 90 tablet 0  . Melatonin 5 MG TABS Take 5 mg by mouth at bedtime.    . methotrexate (RHEUMATREX) 2.5 MG tablet Take 10 mg by mouth once a week.     . Multiple Vitamin (MULTIVITAMIN) capsule Take 1 capsule by mouth daily.    . pindolol (VISKEN) 10 MG tablet Take 2 tablets (20 mg total) 2 (two) times daily by mouth. 360 tablet 2  . potassium chloride SA (K-DUR,KLOR-CON) 20 MEQ tablet TAKE 1 TABLET BY MOUTH  DAILY 90 tablet 0  . spironolactone (ALDACTONE) 25 MG tablet Take 1 tablet (25 mg total) by mouth daily. 90 tablet 3  . omeprazole (PRILOSEC) 20 MG capsule      No current facility-administered medications for this visit.     Allergies:   Benazepril hcl    Social History:  The patient  reports that  has never smoked. she has never used smokeless tobacco. She reports that she does not drink alcohol or use drugs.   Family History:  The patient's family history includes Cancer in her brother, daughter, and maternal aunt; Heart attack in her brother and father; Heart disease in her brother, brother, and father; Stroke in her mother; Vision loss in her father.    ROS:  Please see the history of present illness.   Otherwise, review of systems are positive for none.   All other systems are reviewed and negative.    PHYSICAL EXAM: VS:  BP 128/60   Pulse 60   Ht 5\' 2"  (1.575 m)   Wt 126 lb 6.4 oz (57.3 kg)   BMI 23.12 kg/m  , BMI Body mass index is 23.12 kg/m. GENERAL:  Well appearing HEENT:  Pupils equal round and reactive, fundi not visualized, oral mucosa unremarkable NECK:  No jugular venous distention, waveform within normal limits, carotid upstroke brisk and symmetric, no  bruits, no thyromegaly LYMPHATICS:  No cervical adenopathy LUNGS:  Clear to auscultation bilaterally HEART:  RRR.  PMI not displaced or sustained,S1 and S2 within normal limits, no S3, no S4, no clicks, no rubs, no  murmurs ABD:  Flat, positive bowel sounds normal in frequency in pitch, no bruits, no rebound, no guarding, no midline pulsatile mass, no hepatomegaly, no splenomegaly EXT:  2 plus pulses throughout, no edema, no cyanosis no clubbing SKIN:  No rashes no nodules NEURO:  Cranial nerves II through XII grossly  intact, motor grossly intact throughout PSYCH:  Cognitively intact, oriented to person place and time   EKG:  EKG is ordered today. The ekg ordered today demonstrates sinus rhythm.  Rate 60 bpm.  Left anterior fascicular block.  Echo 02/10/17: Study Conclusions  - Left ventricle: The cavity size was normal. There was mild   concentric hypertrophy. Systolic function was mildly reduced. The   estimated ejection fraction was in the range of 45% to 50%. Mild,   diffuse hypokinesis. - Aortic valve: Transvalvular velocity was within the normal range.   There was no stenosis. There was moderate regurgitation.   Regurgitation pressure half-time: 471 ms. - Mitral valve: Transvalvular velocity was within the normal range.   There was no evidence for stenosis. There was mild regurgitation. - Right ventricle: The cavity size was mildly dilated. Wall   thickness was normal. Systolic function was normal. - Tricuspid valve: There was moderate-severe regurgitation.    Recent Labs: 02/09/2017: Magnesium 1.8 07/11/2017: BNP 471.6 09/15/2017: BUN 19; Creatinine, Ser 0.96; Hemoglobin 12.9; Platelets 187; Potassium 3.8; Sodium 144 12/01/2017: ALT 20; TSH 1.820    Lipid Panel    Component Value Date/Time   CHOL 166 09/15/2017 0845   CHOL 168 08/28/2015 0935   CHOL 131 04/14/2013 0836   TRIG 67 09/15/2017 0845   TRIG 69 04/14/2013 0836   HDL 71 09/15/2017 0845   HDL 53 04/14/2013  0836   CHOLHDL 2.5 08/28/2015 0935   LDLCALC 90 08/28/2015 0935   LDLCALC 64 04/14/2013 0836   LDLDIRECT 98 04/20/2014 1015      Wt Readings from Last 3 Encounters:  12/15/17 126 lb 6.4 oz (57.3 kg)  12/03/17 129 lb (58.5 kg)  09/19/17 128 lb (58.1 kg)      ASSESSMENT AND PLAN:  # Paroxysmal atrial fibrillation: # Bradycardia:  Heart rate was initially 42 and improved to 60.  We will reduce diltiazem to 120 mg daily.  Continue amiodarone and Eliquis.  # Hypertension:  Blood pressure well-controlled.  Continue diltiazem, lisinopril and spironolactone.      Current medicines are reviewed at length with the patient today.  The patient does not have concerns regarding medicines.  The following changes have been made:  Reduce diltiazem 120mg  daily  Labs/ tests ordered today include:   Orders Placed This Encounter  Procedures  . Basic metabolic panel     Disposition:   FU with Dr. Percival Spanish or afib clinic in 2 weeks.     This note was written with the assistance of speech recognition software.  Please excuse any transcriptional errors.  Signed, Ozzy Bohlken C. Oval Linsey, MD, [facility]  12/15/2017 5:34 PM    [place]

## 2017-12-15 NOTE — Telephone Encounter (Signed)
Pt was seen by Dr Oval Linsey today - HR was 42 bpm - decreased diltiazem.  Will f/u in At Fib clinic in 2 weeks.

## 2017-12-16 ENCOUNTER — Ambulatory Visit: Payer: Medicare Other | Admitting: Physician Assistant

## 2017-12-16 LAB — BASIC METABOLIC PANEL
BUN / CREAT RATIO: 20 (ref 12–28)
BUN: 28 mg/dL — AB (ref 8–27)
CO2: 25 mmol/L (ref 20–29)
CREATININE: 1.4 mg/dL — AB (ref 0.57–1.00)
Calcium: 9.5 mg/dL (ref 8.7–10.3)
Chloride: 101 mmol/L (ref 96–106)
GFR calc Af Amer: 40 mL/min/{1.73_m2} — ABNORMAL LOW (ref >59)
GFR calc non Af Amer: 35 mL/min/{1.73_m2} — ABNORMAL LOW (ref >59)
GLUCOSE: 99 mg/dL (ref 65–99)
Potassium: 5.2 mmol/L (ref 3.5–5.2)
SODIUM: 142 mmol/L (ref 134–144)

## 2017-12-17 ENCOUNTER — Telehealth: Payer: Self-pay | Admitting: *Deleted

## 2017-12-17 DIAGNOSIS — Z79899 Other long term (current) drug therapy: Secondary | ICD-10-CM

## 2017-12-17 NOTE — Telephone Encounter (Signed)
Pt aware of her lab work and Dr Percival Spanish recommendation to have BMP redraw in 1 week, Lab order in [employer]

## 2017-12-17 NOTE — Telephone Encounter (Signed)
-----   Message from Minus Breeding, MD sent at 12/17/2017 10:15 AM EST ----- Creat is elevated above baseline.  I would like to have this repeated next week.

## 2017-12-18 ENCOUNTER — Other Ambulatory Visit: Payer: Self-pay | Admitting: Family Medicine

## 2017-12-22 ENCOUNTER — Other Ambulatory Visit: Payer: Medicare Other

## 2017-12-22 DIAGNOSIS — I10 Essential (primary) hypertension: Secondary | ICD-10-CM

## 2017-12-22 DIAGNOSIS — I482 Chronic atrial fibrillation, unspecified: Secondary | ICD-10-CM

## 2017-12-22 DIAGNOSIS — Z79899 Other long term (current) drug therapy: Secondary | ICD-10-CM

## 2017-12-22 LAB — BASIC METABOLIC PANEL
BUN/Creatinine Ratio: 22 (ref 12–28)
BUN: 30 mg/dL — AB (ref 8–27)
CHLORIDE: 100 mmol/L (ref 96–106)
CO2: 27 mmol/L (ref 20–29)
Calcium: 9.5 mg/dL (ref 8.7–10.3)
Creatinine, Ser: 1.37 mg/dL — ABNORMAL HIGH (ref 0.57–1.00)
GFR, EST AFRICAN AMERICAN: 41 mL/min/{1.73_m2} — AB (ref >59)
GFR, EST NON AFRICAN AMERICAN: 36 mL/min/{1.73_m2} — AB (ref >59)
Glucose: 63 mg/dL — ABNORMAL LOW (ref 65–99)
POTASSIUM: 5.3 mmol/L — AB (ref 3.5–5.2)
Sodium: 143 mmol/L (ref 134–144)

## 2017-12-30 ENCOUNTER — Encounter (HOSPITAL_COMMUNITY): Payer: Self-pay | Admitting: Nurse Practitioner

## 2017-12-30 ENCOUNTER — Ambulatory Visit (HOSPITAL_COMMUNITY)
Admission: RE | Admit: 2017-12-30 | Discharge: 2017-12-30 | Disposition: A | Discharge status: Home / Self Care | Payer: Medicare Other | Source: Ambulatory Visit | Attending: Nurse Practitioner | Admitting: Nurse Practitioner

## 2017-12-30 VITALS — BP 172/70 | HR 58 | Ht 62.0 in | Wt 127.0 lb

## 2017-12-30 DIAGNOSIS — I480 Paroxysmal atrial fibrillation: Secondary | ICD-10-CM | POA: Diagnosis not present

## 2017-12-30 DIAGNOSIS — R001 Bradycardia, unspecified: Secondary | ICD-10-CM | POA: Insufficient documentation

## 2017-12-30 DIAGNOSIS — Z7901 Long term (current) use of anticoagulants: Secondary | ICD-10-CM | POA: Diagnosis not present

## 2017-12-30 DIAGNOSIS — Z79890 Hormone replacement therapy (postmenopausal): Secondary | ICD-10-CM | POA: Insufficient documentation

## 2017-12-30 DIAGNOSIS — Z79899 Other long term (current) drug therapy: Secondary | ICD-10-CM | POA: Insufficient documentation

## 2017-12-30 DIAGNOSIS — I10 Essential (primary) hypertension: Secondary | ICD-10-CM | POA: Insufficient documentation

## 2017-12-30 LAB — BASIC METABOLIC PANEL
ANION GAP: 10 (ref 5–15)
BUN: 25 mg/dL — ABNORMAL HIGH (ref 6–20)
CO2: 27 mmol/L (ref 22–32)
Calcium: 9.3 mg/dL (ref 8.9–10.3)
Chloride: 104 mmol/L (ref 101–111)
Creatinine, Ser: 1.34 mg/dL — ABNORMAL HIGH (ref 0.44–1.00)
GFR, EST AFRICAN AMERICAN: 41 mL/min — AB (ref >60)
GFR, EST NON AFRICAN AMERICAN: 36 mL/min — AB (ref >60)
GLUCOSE: 98 mg/dL (ref 65–99)
POTASSIUM: 4.4 mmol/L (ref 3.5–5.1)
Sodium: 141 mmol/L (ref 135–145)

## 2017-12-30 NOTE — Progress Notes (Signed)
Primary Care Physician: Chipper Herb, MD Referring Physician:Dr. Neomia Dear Susan Fitzgerald is a 84 y.o. female with a h/o persistent afib that recently underwent amiodarone loading and successful cardioversion. She is here today for f/u from Dr. Blenda Mounts office for shortness of breath and found to be have sinus brady initially  in the 40's but then picked up to the 60's. Her diltiazem dose was reduced.  She is feeling better in SR with HR in the upper 50's with noticeable improvement in shortness of breath. She however describes some days that she has shortness of breath with activities and other days she feels great. I suspect she is still having some breakthrough afib. She is still doing better on amiodarone than just rate control. She continues on eliquis 2.5 mg bid for chadsvasc score of at least 4.  BP elevated, rechecked and better at 140/78. Readings at home, have been improved with the addition of spironolactone, systolic's ranging 496-759 systolic.   Today, she denies symptoms of palpitations, chest pain, shortness of breath, orthopnea, PND, lower extremity edema, dizziness, presyncope, syncope, or neurologic sequela. The patient is tolerating medications without difficulties and is otherwise without complaint today.   Past Medical History:  Diagnosis Date  . Arthritis   . Bradycardia 2014  . Cataract   . Diverticulosis   . Dyspnea    periodically  . Dysrhythmia    a - fib  . Heart murmur    ??? bruit  . History of stress test    a. 04/09/13-nuclear stress-no ischemia low risk  . Hypertension   . Menopause   . Osteoporosis   . PAF (paroxysmal atrial fibrillation) ([place])    a. s/p prior DCCV; b. On flecainide & coumadin (CHA2DS2VASc = 4);  c. 03/2016 Echo: EF 55-60%, mod LVH, mod AI, mild to mod TR, PASP 77mmHg.  Marland Kitchen Pelvic fracture [facility])    Past Surgical History:  Procedure Laterality Date  . BIOPSY BREAST    . BIOPSY BREAST     right & benign  . BREAST SURGERY    .  CARDIOVERSION N/A 10/30/2016   Procedure: CARDIOVERSION;  Surgeon: Larey Dresser, MD;  Location: [place];  Service: Cardiovascular;  Laterality: N/A;  . CARDIOVERSION N/A 08/20/2017   Procedure: CARDIOVERSION;  Surgeon: Jerline Pain, MD;  Location: [facility] ENDOSCOPY;  Service: Cardiovascular;  Laterality: N/A;  . CATARACT EXTRACTION    . EYE SURGERY     cataracts  . TEE WITHOUT CARDIOVERSION N/A 10/30/2016   Procedure: TRANSESOPHAGEAL ECHOCARDIOGRAM (TEE);  Surgeon: Larey Dresser, MD;  Location: [place];  Service: Cardiovascular;  Laterality: N/A;  . TOTAL HIP ARTHROPLASTY Right 01/27/2017   Procedure: TOTAL HIP ARTHROPLASTY ANTERIOR APPROACH;  Surgeon: Rod Can, MD;  Location: [place];  Service: Orthopedics;  Laterality: Right;    Current Outpatient Medications  Medication Sig Dispense Refill  . amiodarone (PACERONE) 200 MG tablet Take 200 mg by mouth daily.    . calcium-vitamin D (OSCAL WITH D) 250-125 MG-UNIT per tablet Take 1 tablet by mouth 2 (two) times daily.    Marland Kitchen diltiazem (CARDIZEM CD) 120 MG 24 hr capsule Take 1 capsule (120 mg total) by mouth daily. 30 capsule 5  . ELIQUIS 2.5 MG TABS tablet TAKE 1 TABLET BY MOUTH 2  TIMES DAILY. PUT ON HOLD  UNTIL PATIENT REQUESTS. 163 tablet 1  . folic acid (FOLVITE) 1 MG tablet Take 1 mg by mouth daily.    . furosemide (LASIX) 20 MG  tablet TAKE 1 TO 2 TABLETS BY  MOUTH DAILY AS NEEDED 180 tablet 0  . gabapentin (NEURONTIN) 100 MG capsule Take 100 mg by mouth daily as needed.    Marland Kitchen levothyroxine (SYNTHROID, LEVOTHROID) 75 MCG tablet Take 1 tablet (75 mcg total) by mouth daily. 90 tablet 1  . lisinopril (PRINIVIL,ZESTRIL) 40 MG tablet TAKE 1 TABLET BY MOUTH  DAILY 90 tablet 0  . Melatonin 5 MG TABS Take 5 mg by mouth at bedtime.    . methotrexate (RHEUMATREX) 2.5 MG tablet Take 10 mg by mouth once a week.     . Multiple Vitamin (MULTIVITAMIN) capsule Take 1 capsule by mouth daily.    . pindolol (VISKEN) 10 MG tablet Take 2 tablets  (20 mg total) 2 (two) times daily by mouth. 360 tablet 2  . spironolactone (ALDACTONE) 25 MG tablet Take 1 tablet (25 mg total) by mouth daily. 90 tablet 3   No current facility-administered medications for this encounter.     Allergies  Allergen Reactions  . Benazepril Hcl Cough    Social History   Socioeconomic History  . Marital status: Married    Spouse name: Not on file  . Number of children: Not on file  . Years of education: Not on file  . Highest education level: Not on file  Social Needs  . Financial resource strain: Not on file  . Food insecurity - worry: Not on file  . Food insecurity - inability: Not on file  . Transportation needs - medical: Not on file  . Transportation needs - non-medical: Not on file  Occupational History  . Not on file  Tobacco Use  . Smoking status: Never Smoker  . Smokeless tobacco: Never Used  Substance and Sexual Activity  . Alcohol use: No  . Drug use: No  . Sexual activity: Yes  Other Topics Concern  . Not on file  Social History Narrative  . Not on file    Family History  Problem Relation Age of Onset  . Stroke Mother        cerebral hemorrhage  . Heart disease Father        MI  . Heart attack Father   . Vision loss Father   . Heart disease Brother   . Heart attack Brother   . Cancer Maternal Aunt        breast  . Cancer Brother   . Heart disease Brother   . Cancer Daughter        breast    ROS- All systems are reviewed and negative except as per the HPI above  Physical Exam: Vitals:   12/30/17 1014  BP: (!) 172/70  Pulse: (!) 58  Weight: 127 lb (57.6 kg)  Height: 5\' 2"  (1.575 m)   Wt Readings from Last 3 Encounters:  12/30/17 127 lb (57.6 kg)  12/15/17 126 lb 6.4 oz (57.3 kg)  12/03/17 129 lb (58.5 kg)    Labs: Lab Results  Component Value Date   NA 141 12/30/2017   K 4.4 12/30/2017   CL 104 12/30/2017   CO2 27 12/30/2017   GLUCOSE 98 12/30/2017   BUN 25 (H) 12/30/2017   CREATININE 1.34 (H)  12/30/2017   CALCIUM 9.3 12/30/2017   MG 1.8 02/09/2017   Lab Results  Component Value Date   INR 1.9 (H) 03/20/2017   Lab Results  Component Value Date   CHOL 166 09/15/2017   HDL 71 09/15/2017   LDLCALC 90 08/28/2015  TRIG 67 09/15/2017     GEN- The patient is well appearing, alert and oriented x 3 today.   Head- normocephalic, atraumatic Eyes-  Sclera clear, conjunctiva pink Ears- hearing intact Oropharynx- clear Neck- supple, no JVP Lymph- no cervical lymphadenopathy Lungs- Clear to ausculation bilaterally, normal work of breathing Heart- Regular rate and rhythm, no murmurs, rubs or gallops, PMI not laterally displaced GI- soft, NT, ND, + BS Extremities- no clubbing, cyanosis, or edema MS- no significant deformity or atrophy Skin- no rash or lesion Psych- euthymic mood, full affect Neuro- strength and sensation are intact  EKG- Sinus brady at 58 bpm, Lafb, pr int 182 ms, QRS int 102 ms, qtc 439 ms Epic records reviewed    Assessment and Plan: 1. Paroxysmal afib In SR today but if I feel  that pt may have some days in afib which may explain intermittent  shortness of breath, but overall, much improved with the amiodarone Continue amiodarone  200 mg a day Continue eliquis 2.5 mg bid, chadsvasc score of at least 4    2. Bradycardia Now on  reduced dose of cardizem 120 mg a day Feels improved  3. HTN improved with home readings with addition of spironolactone Rechecked here and BP improved at 140/78 Recheck bmet with elevation of K+ on last lab check, K+ supplement stopped Avoid salt  F/u with Dr. Percival Spanish in [place], as scheduled March 20th Afib clinic as needed  Geroge Baseman. Belinda Bringhurst, [facility] [address] [place], [place] [address] [phone]

## 2018-01-02 ENCOUNTER — Other Ambulatory Visit (HOSPITAL_COMMUNITY): Payer: Self-pay | Admitting: Nurse Practitioner

## 2018-01-02 ENCOUNTER — Other Ambulatory Visit: Payer: Self-pay | Admitting: Family Medicine

## 2018-01-02 MED ORDER — DILTIAZEM HCL ER COATED BEADS 120 MG PO CP24: 120.0000 mg | ORAL_CAPSULE | Freq: Every day | ORAL | 3 refills | Status: DC

## 2018-01-19 ENCOUNTER — Ambulatory Visit (INDEPENDENT_AMBULATORY_CARE_PROVIDER_SITE_OTHER): Payer: Medicare Other | Admitting: Nurse Practitioner

## 2018-01-19 ENCOUNTER — Encounter: Payer: Self-pay | Admitting: Nurse Practitioner

## 2018-01-19 VITALS — BP 147/67 | HR 56 | Temp 97.0°F | Ht 62.0 in | Wt 128.2 lb

## 2018-01-19 DIAGNOSIS — Z01419 Encounter for gynecological examination (general) (routine) without abnormal findings: Secondary | ICD-10-CM | POA: Diagnosis not present

## 2018-01-19 DIAGNOSIS — I10 Essential (primary) hypertension: Secondary | ICD-10-CM | POA: Diagnosis not present

## 2018-01-19 DIAGNOSIS — Z0000 Encounter for general adult medical examination without abnormal findings: Secondary | ICD-10-CM

## 2018-01-19 DIAGNOSIS — E034 Atrophy of thyroid (acquired): Secondary | ICD-10-CM | POA: Diagnosis not present

## 2018-01-19 DIAGNOSIS — I482 Chronic atrial fibrillation, unspecified: Secondary | ICD-10-CM

## 2018-01-19 DIAGNOSIS — Z79899 Other long term (current) drug therapy: Secondary | ICD-10-CM

## 2018-01-19 NOTE — Progress Notes (Signed)
   Subjective:    Patient ID: Susan Fitzgerald, female    DOB: 06/27/1932, 84 y.o.   MRN: [id number]  HPI  Susan Fitzgerald is a regular patient of Dr. Laurance Flatten who was sent to me today for pelvic and breast exam. She had follow up with DR. Moore on 09/19/17 and was doing well. She also saw cardiology on 12/15/17 and no changes were made to plan of care. Patient says she is doing well today with no complaints.   Review of Systems  Constitutional: Negative for activity change and appetite change.  HENT: Negative.   Eyes: Negative for pain.  Respiratory: Negative for shortness of breath.   Cardiovascular: Negative for chest pain, palpitations and leg swelling.  Gastrointestinal: Negative for abdominal pain.  Endocrine: Negative for polydipsia.  Genitourinary: Negative.   Skin: Negative for rash.  Neurological: Negative for dizziness, weakness and headaches.  Hematological: Does not bruise/bleed easily.  Psychiatric/Behavioral: Negative.   All other systems reviewed and are negative.      Objective:   Physical Exam  Constitutional: She is oriented to person, place, and time. She appears well-developed and well-nourished.  HENT:  Head: Normocephalic.  Right Ear: Hearing, tympanic membrane, external ear and ear canal normal.  Left Ear: Hearing, tympanic membrane, external ear and ear canal normal.  Nose: Nose normal.  Mouth/Throat: Uvula is midline and oropharynx is clear and moist.  Eyes: Conjunctivae and EOM are normal. Pupils are equal, round, and reactive to light.  Neck: Trachea normal, normal range of motion and full passive range of motion without pain. Neck supple. No JVD present. Carotid bruit is not present. No thyroid mass and no thyromegaly present.  Cardiovascular: Normal rate, regular rhythm, normal heart sounds and intact distal pulses. Exam reveals no gallop and no friction rub.  No murmur heard. Circulatory skin changes to bil lower ext.  Pulmonary/Chest: Effort normal and breath  sounds normal. Right breast exhibits no inverted nipple, no mass, no nipple discharge, no skin change and no tenderness. Left breast exhibits no inverted nipple, no mass, no nipple discharge, no skin change and no tenderness.  Abdominal: Soft. Bowel sounds are normal. She exhibits no distension and no mass. There is no tenderness.  Genitourinary: Vagina normal and uterus normal. No breast swelling, tenderness, discharge or bleeding. No vaginal discharge found.  Genitourinary Comments: bimanual exam-No adnexal masses or tenderness. Cervical os stenosis  Musculoskeletal: Normal range of motion.  Lymphadenopathy:    She has no cervical adenopathy.  Neurological: She is alert and oriented to person, place, and time. She has normal reflexes.  Skin: Skin is warm and dry.  Multiple skin macular brownish skin lesions on trunk  Psychiatric: She has a normal mood and affect. Her behavior is normal. Judgment and thought content normal.   BP (!) 147/67   Pulse (!) 56   Temp (!) 97 F (36.1 C)   Ht 5\' 2"  (1.575 m)   Wt 128 lb 3.2 oz (58.2 kg)   BMI 23.45 kg/m      Assessment & Plan:   1. Annual physical exam   2. Atrial fibrillation, chronic ([place])   3. Essential hypertension   4. Long-term use of high-risk medication   5. Hypothyroidism due to acquired atrophy of thyroid    Patient has assessments in note that are for labs for other providers Pap and breasst exam was all that was done  Chevis Pretty, FNP

## 2018-01-20 LAB — BASIC METABOLIC PANEL
BUN/Creatinine Ratio: 20 (ref 12–28)
BUN: 28 mg/dL — AB (ref 8–27)
CALCIUM: 9 mg/dL (ref 8.7–10.3)
CO2: 24 mmol/L (ref 20–29)
CREATININE: 1.38 mg/dL — AB (ref 0.57–1.00)
Chloride: 103 mmol/L (ref 96–106)
GFR calc Af Amer: 41 mL/min/{1.73_m2} — ABNORMAL LOW (ref >59)
GFR calc non Af Amer: 35 mL/min/{1.73_m2} — ABNORMAL LOW (ref >59)
Glucose: 84 mg/dL (ref 65–99)
Potassium: 4.3 mmol/L (ref 3.5–5.2)
Sodium: 142 mmol/L (ref 134–144)

## 2018-01-20 LAB — THYROID PANEL WITH TSH
Free Thyroxine Index: 3.8 (ref 1.2–4.9)
T3 UPTAKE RATIO: 32 % (ref 24–39)
T4 TOTAL: 12 ug/dL (ref 4.5–12.0)
TSH: 1.62 u[IU]/mL (ref 0.450–4.500)

## 2018-01-21 LAB — PAP IG (IMAGE GUIDED): PAP SMEAR COMMENT: 0

## 2018-01-22 ENCOUNTER — Telehealth: Payer: Self-pay | Admitting: *Deleted

## 2018-01-22 DIAGNOSIS — R0602 Shortness of breath: Secondary | ICD-10-CM

## 2018-01-22 DIAGNOSIS — I482 Chronic atrial fibrillation, unspecified: Secondary | ICD-10-CM

## 2018-01-22 NOTE — Telephone Encounter (Signed)
Called pt to review her most recent lab results.  While discussing those with her she reports continued bouts of SOB off and on.  She reports at times she feels SOB and some weakness like she needs to sit down while doing things like singing at church.  She understands she does still go into and out of A Fib and this could be a possible cause of her SOB/weakness.  She had an appt with Dr Percival Spanish on 3/20 which had to be rescheduled.  In reviewing pt's chart, her last echo was completed 02/10/2017 and demonstrated EF of 45 to 50%, Aortic valve with moderate regurgitation-no stenosis and moderate-severe regurg at tricuspid valve.  There is no mention on when this testing should be repeated.  Will forward this information to Dr Percival Spanish for review and to see if he would want the echo repeated prior to her appt scheduled in April at the [facility] office.  Pt aware I will c/b if any new orders are given.   FYI- Of note last Lexiscan was 07/03/2017  Nuclear stress EF: 51%.  There was no ST segment deviation noted during stress.  The study is normal.  This is a low risk study.  The left ventricular ejection fraction is mildly decreased (45-54%).

## 2018-01-22 NOTE — Telephone Encounter (Signed)
Pt aware of orders.  Scheduled echo and lab work for next week.

## 2018-01-22 NOTE — Telephone Encounter (Signed)
Order repeat echo and BNP.

## 2018-01-26 ENCOUNTER — Other Ambulatory Visit: Payer: Medicare Other

## 2018-01-26 ENCOUNTER — Telehealth: Payer: Self-pay | Admitting: Family Medicine

## 2018-01-26 DIAGNOSIS — R300 Dysuria: Secondary | ICD-10-CM | POA: Diagnosis not present

## 2018-01-26 LAB — URINALYSIS, COMPLETE
BILIRUBIN UA: NEGATIVE
Glucose, UA: NEGATIVE
Ketones, UA: NEGATIVE
NITRITE UA: NEGATIVE
PH UA: 6.5 (ref 5.0–7.5)
Protein, UA: NEGATIVE
RBC UA: NEGATIVE
Specific Gravity, UA: 1.01 (ref 1.005–1.030)
Urobilinogen, Ur: 0.2 mg/dL (ref 0.2–1.0)

## 2018-01-26 LAB — MICROSCOPIC EXAMINATION

## 2018-01-26 NOTE — Telephone Encounter (Signed)
Pt aware to come by for UA

## 2018-01-26 NOTE — Telephone Encounter (Signed)
Okay to check a clean-catch midstream urine specimen for urinalysis and culture and sensitivity

## 2018-01-27 ENCOUNTER — Other Ambulatory Visit: Payer: Medicare Other

## 2018-01-27 ENCOUNTER — Other Ambulatory Visit: Payer: Self-pay

## 2018-01-27 ENCOUNTER — Ambulatory Visit (HOSPITAL_COMMUNITY): Payer: Medicare Other | Attending: Cardiology

## 2018-01-27 DIAGNOSIS — R0600 Dyspnea, unspecified: Secondary | ICD-10-CM | POA: Diagnosis present

## 2018-01-27 DIAGNOSIS — I482 Chronic atrial fibrillation, unspecified: Secondary | ICD-10-CM

## 2018-01-27 DIAGNOSIS — I080 Rheumatic disorders of both mitral and aortic valves: Secondary | ICD-10-CM | POA: Insufficient documentation

## 2018-01-27 DIAGNOSIS — I429 Cardiomyopathy, unspecified: Secondary | ICD-10-CM | POA: Diagnosis present

## 2018-01-27 DIAGNOSIS — I272 Pulmonary hypertension, unspecified: Secondary | ICD-10-CM | POA: Diagnosis not present

## 2018-01-27 DIAGNOSIS — R0602 Shortness of breath: Secondary | ICD-10-CM

## 2018-01-27 DIAGNOSIS — I77810 Thoracic aortic ectasia: Secondary | ICD-10-CM | POA: Insufficient documentation

## 2018-01-27 LAB — PRO B NATRIURETIC PEPTIDE: NT-Pro BNP: 631 pg/mL (ref 0–738)

## 2018-01-29 ENCOUNTER — Telehealth: Payer: Self-pay | Admitting: Cardiology

## 2018-01-29 ENCOUNTER — Telehealth: Payer: Self-pay | Admitting: Family Medicine

## 2018-01-29 NOTE — Telephone Encounter (Signed)
Susan Fitzgerald is calling to find out about her Test Results . Please call

## 2018-01-29 NOTE — Telephone Encounter (Signed)
Order was placed for a future culture, so it was not done. Pt will come in tomorrow

## 2018-01-30 ENCOUNTER — Telehealth: Payer: Self-pay | Admitting: Family Medicine

## 2018-01-30 ENCOUNTER — Other Ambulatory Visit: Payer: Medicare Other

## 2018-01-30 ENCOUNTER — Telehealth: Payer: Self-pay | Admitting: *Deleted

## 2018-01-30 DIAGNOSIS — R300 Dysuria: Secondary | ICD-10-CM

## 2018-01-30 MED ORDER — CIPROFLOXACIN HCL 500 MG PO TABS: 500.0000 mg | ORAL_TABLET | Freq: Two times a day (BID) | ORAL | 0 refills | Status: DC

## 2018-01-30 NOTE — Telephone Encounter (Signed)
Susan Fitzgerald spoke with patient

## 2018-01-30 NOTE — Telephone Encounter (Signed)
Pt notified of RX 

## 2018-01-30 NOTE — Telephone Encounter (Signed)
cipro rx sent to pharamcy

## 2018-01-30 NOTE — Telephone Encounter (Signed)
Pt was brought in urine today for culture, because the order on 01/26/18 was placed for future order. She is having burning and wants treatment and doesn't want to wait for culture to come back. She also had a pap recently and thought it could be related to that

## 2018-02-01 LAB — URINE CULTURE

## 2018-02-02 ENCOUNTER — Telehealth: Payer: Self-pay | Admitting: Family Medicine

## 2018-02-02 MED ORDER — CEFDINIR 300 MG PO CAPS: 300.0000 mg | ORAL_CAPSULE | Freq: Two times a day (BID) | ORAL | 0 refills | Status: DC

## 2018-02-02 NOTE — Telephone Encounter (Signed)
Pt aware of lab and meds sent to [facility]

## 2018-02-03 ENCOUNTER — Telehealth: Payer: Self-pay | Admitting: Cardiology

## 2018-02-03 DIAGNOSIS — I480 Paroxysmal atrial fibrillation: Secondary | ICD-10-CM

## 2018-02-03 DIAGNOSIS — I10 Essential (primary) hypertension: Secondary | ICD-10-CM

## 2018-02-03 NOTE — Telephone Encounter (Signed)
New Message   Patient is calling in reference to her test results. Please call to discuss.

## 2018-02-03 NOTE — Telephone Encounter (Signed)
Returned call to patient BNP and echo results given.Advised to increase Lasix to 40 mg daily.Repeat bmet in 10 days.She will have done at [facility] office.

## 2018-02-04 ENCOUNTER — Ambulatory Visit: Payer: Medicare Other | Admitting: Cardiology

## 2018-02-09 NOTE — Telephone Encounter (Signed)
Pt aware of her Echo, result given by cheryl pugh 03/19

## 2018-02-13 ENCOUNTER — Other Ambulatory Visit: Payer: Medicare Other

## 2018-02-13 DIAGNOSIS — I480 Paroxysmal atrial fibrillation: Secondary | ICD-10-CM | POA: Diagnosis not present

## 2018-02-13 DIAGNOSIS — I10 Essential (primary) hypertension: Secondary | ICD-10-CM | POA: Diagnosis not present

## 2018-02-13 DIAGNOSIS — N390 Urinary tract infection, site not specified: Secondary | ICD-10-CM | POA: Diagnosis not present

## 2018-02-13 DIAGNOSIS — B9620 Unspecified Escherichia coli [E. coli] as the cause of diseases classified elsewhere: Secondary | ICD-10-CM | POA: Diagnosis not present

## 2018-02-13 LAB — URINALYSIS, ROUTINE W REFLEX MICROSCOPIC
BILIRUBIN UA: NEGATIVE
Glucose, UA: NEGATIVE
Ketones, UA: NEGATIVE
Leukocytes, UA: NEGATIVE
Nitrite, UA: NEGATIVE
PH UA: 5.5 (ref 5.0–7.5)
PROTEIN UA: NEGATIVE
RBC UA: NEGATIVE
Specific Gravity, UA: 1.015 (ref 1.005–1.030)
UUROB: 0.2 mg/dL (ref 0.2–1.0)

## 2018-02-13 LAB — MICROSCOPIC EXAMINATION
Epithelial Cells (non renal): >10 /hpf — AB (ref 0–10)
RBC, UA: NONE SEEN /hpf (ref 0–2)
RENAL EPITHEL UA: NONE SEEN /HPF

## 2018-02-14 LAB — BASIC METABOLIC PANEL
BUN / CREAT RATIO: 23 (ref 12–28)
BUN: 44 mg/dL — ABNORMAL HIGH (ref 8–27)
CALCIUM: 8.9 mg/dL (ref 8.7–10.3)
CHLORIDE: 101 mmol/L (ref 96–106)
CO2: 25 mmol/L (ref 20–29)
Creatinine, Ser: 1.93 mg/dL — ABNORMAL HIGH (ref 0.57–1.00)
GFR calc non Af Amer: 24 mL/min/{1.73_m2} — ABNORMAL LOW (ref >59)
GFR, EST AFRICAN AMERICAN: 27 mL/min/{1.73_m2} — AB (ref >59)
Glucose: 86 mg/dL (ref 65–99)
POTASSIUM: 4.2 mmol/L (ref 3.5–5.2)
Sodium: 142 mmol/L (ref 134–144)

## 2018-02-15 LAB — URINE CULTURE

## 2018-02-16 ENCOUNTER — Other Ambulatory Visit: Payer: Self-pay | Admitting: *Deleted

## 2018-02-16 ENCOUNTER — Telehealth (HOSPITAL_COMMUNITY): Payer: Self-pay | Admitting: *Deleted

## 2018-02-16 ENCOUNTER — Telehealth: Payer: Self-pay | Admitting: *Deleted

## 2018-02-16 DIAGNOSIS — Z79899 Other long term (current) drug therapy: Secondary | ICD-10-CM

## 2018-02-16 DIAGNOSIS — Z87440 Personal history of urinary (tract) infections: Secondary | ICD-10-CM

## 2018-02-16 MED ORDER — CEFDINIR 300 MG PO CAPS: 300.0000 mg | ORAL_CAPSULE | Freq: Two times a day (BID) | ORAL | 0 refills | Status: DC

## 2018-02-16 NOTE — Telephone Encounter (Signed)
-----   Message from Minus Breeding, MD sent at 02/14/2018  2:40 PM EDT ----- Creat is elevated after recent Lasix increase.  I would suggest that she hold her Lasix for two days and the start 20 mg daily and then repeat BMET in 10 days.  Call Ms. Najera with the results and send results to Chipper Herb, MD

## 2018-02-16 NOTE — Telephone Encounter (Signed)
Pt reports that with very little exertion she is developing weakness from her chest to her abdomen.  Pt describes this as feeling like she will crumble or fall.  BP fine per patient, afib is not "running crazy, just in and out of rhythm"  Pt played golf last week and reported vomiting after 7th hole.  Pt wants to know what Kerby Less recommendation would be.  Dr. Laurance Flatten will not be in until Friday and she tried to check with them first.

## 2018-02-16 NOTE — Telephone Encounter (Signed)
Pt aware of her blood work, BMP ordered for pt to get done in 10 days, pt have appt in [place] with Dr Percival Spanish 04/10

## 2018-02-16 NOTE — Telephone Encounter (Signed)
I cld pt back, per Roderic Palau, NP pt should drop Pindolol to 10 mg in the morning and 10 mg in the evening.  It is possible that the patient may be experiencing a drop in HR or BP.  Pt was asked to monitor BP and HR during one of these weakness spells.  Pt was also advised per Butch Penny that if the lowering of the pindolol does not make the spells of weakness improve, she may need a 30 day monitor.  Pt understood and will see [place] Monday in the office for assessment.

## 2018-02-20 ENCOUNTER — Ambulatory Visit (INDEPENDENT_AMBULATORY_CARE_PROVIDER_SITE_OTHER): Payer: Medicare Other | Admitting: Family Medicine

## 2018-02-20 ENCOUNTER — Encounter: Payer: Self-pay | Admitting: Family Medicine

## 2018-02-20 VITALS — BP 129/65 | HR 57 | Temp 98.1°F | Ht 60.25 in | Wt 130.0 lb

## 2018-02-20 DIAGNOSIS — I482 Chronic atrial fibrillation, unspecified: Secondary | ICD-10-CM

## 2018-02-20 DIAGNOSIS — M459 Ankylosing spondylitis of unspecified sites in spine: Secondary | ICD-10-CM | POA: Diagnosis not present

## 2018-02-20 DIAGNOSIS — E034 Atrophy of thyroid (acquired): Secondary | ICD-10-CM | POA: Diagnosis not present

## 2018-02-20 DIAGNOSIS — Z87440 Personal history of urinary (tract) infections: Secondary | ICD-10-CM

## 2018-02-20 DIAGNOSIS — R531 Weakness: Secondary | ICD-10-CM | POA: Diagnosis not present

## 2018-02-20 DIAGNOSIS — I700 Atherosclerosis of aorta: Secondary | ICD-10-CM

## 2018-02-20 NOTE — Addendum Note (Signed)
Addended by: Zannie Cove on: 02/20/2018 08:47 AM   Modules accepted: Orders

## 2018-02-20 NOTE — Progress Notes (Signed)
Subjective:    Patient ID: Susan Fitzgerald, female    DOB: 1932-10-11, 84 y.o.   MRN: [id number]  HPI  Patient here today for a weakness in her chest and abdomen.  The patient has been in close communication with the cardiologist regarding her symptoms.  Her pindolol has been decreased because of low blood pressures.  This patient has multiple medical diagnoses with a history of atrial fibrillation avascular necrosis of the right hip, spinal stenosis of the lumbar region hypothyroidism, and a aortic atherosclerosis and rheumatoid arthritis.  She has had right hip surgery.  She was cardioverted because of atrial fibrillation in October 2018.  Her right hip arthroplasty was done in the spring 2018.  After discussing with the patient her history and her symptoms it is very hard to define what is going on.  I do not know if her blood pressure is dropping or if her pulse rate could be dropping.  I reviewed her personal diary and it seems like her heart rate was good at the time she finished playing golf when she had this most recent spell.  Her blood pressure had dropped some and this may be part of the etiology of her symptoms.  She has had her pindolol reduced by the nurse practitioner.  She is going to see the cardiologist next week.  We will wait and see what his recommendations are.  May be an event monitor may be a 24-hour blood pressure monitor?  She does admit to some pressure and some shortness of breath at times.  No chest pain.  She denies any nausea vomiting diarrhea blood in the stool or black tarry bowel movements other than one episode of vomiting after playing golf since she is been on antibiotics for urinary tract infections.  She is not having any trouble passing her water currently and is taking Omnicef and has not completed that.  If she continues to have urinary tract infections we will need to refer her to the urologist for further evaluation.  She also complains of an irritation in her mouth  today.   Patient Active Problem List   Diagnosis Date Noted  . Hypothyroidism 09/18/2017  . SOB (shortness of breath) 07/13/2017  . Spinal stenosis of lumbar region with neurogenic claudication 03/20/2017  . Atrial fibrillation with rapid ventricular response ([place]) 02/10/2017  . Atrial fibrillation ([place]) 02/10/2017  . Avascular necrosis of hip, right ([place]) 01/27/2017  . Avascular necrosis of bone of right hip ([place]) 01/27/2017  . Abdominal aortic atherosclerosis ([place]) 09/24/2016  . Rheumatoid arthritis ([place]) 05/07/2016  . Symptomatic bradycardia 06/02/2013  . Hypertension 04/21/2013  . Osteoporosis 04/21/2013  . Atrial fibrillation, chronic ([place]) 02/09/2013   Outpatient Encounter Medications as of 02/20/2018  Medication Sig  . amiodarone (PACERONE) 200 MG tablet Take 200 mg by mouth daily.  . calcium-vitamin D (OSCAL WITH D) 250-125 MG-UNIT per tablet Take 1 tablet by mouth 2 (two) times daily.  . cefdinir (OMNICEF) 300 MG capsule Take 1 capsule (300 mg total) by mouth 2 (two) times daily. 1 po BID  . diltiazem (CARDIZEM CD) 120 MG 24 hr capsule Take 1 capsule (120 mg total) by mouth daily.  Marland Kitchen ELIQUIS 2.5 MG TABS tablet TAKE 1 TABLET BY MOUTH 2  TIMES DAILY. PUT ON HOLD  UNTIL PATIENT REQUESTS.  . folic acid (FOLVITE) 1 MG tablet Take 1 mg by mouth daily.  . furosemide (LASIX) 40 MG tablet Take 1 tablet (40 mg total) by mouth daily.  Marland Kitchen  gabapentin (NEURONTIN) 100 MG capsule Take 100 mg by mouth daily as needed.  Marland Kitchen levothyroxine (SYNTHROID, LEVOTHROID) 75 MCG tablet Take 1 tablet (75 mcg total) by mouth daily.  Marland Kitchen lisinopril (PRINIVIL,ZESTRIL) 40 MG tablet TAKE 1 TABLET BY MOUTH  DAILY  . Melatonin 5 MG TABS Take 5 mg by mouth at bedtime.  . methotrexate (RHEUMATREX) 2.5 MG tablet Take 10 mg by mouth once a week.   . Multiple Vitamin (MULTIVITAMIN) capsule Take 1 capsule by mouth daily.  . pindolol (VISKEN) 10 MG tablet Take 2 tablets (20 mg total) 2 (two) times daily by mouth. (Patient  taking differently: Take 10 mg by mouth 2 (two) times daily. )  . spironolactone (ALDACTONE) 25 MG tablet Take 1 tablet (25 mg total) by mouth daily.  . [DISCONTINUED] ciprofloxacin (CIPRO) 500 MG tablet Take 1 tablet (500 mg total) by mouth 2 (two) times daily.  . [DISCONTINUED] potassium chloride SA (K-DUR,KLOR-CON) 20 MEQ tablet TAKE 1 TABLET BY MOUTH  DAILY   No facility-administered encounter medications on file as of 02/20/2018.      Review of Systems  Constitutional: Negative.   HENT: Negative.   Eyes: Negative.   Respiratory: Negative.   Cardiovascular: Negative.   Gastrointestinal: Negative.   Endocrine: Negative.   Genitourinary: Negative.   Musculoskeletal: Negative.   Skin: Negative.   Allergic/Immunologic: Negative.   Neurological: Positive for weakness (in chest area ).  Hematological: Negative.   Psychiatric/Behavioral: Negative.        Objective:   Physical Exam  Constitutional: She is oriented to person, place, and time. She appears well-developed and well-nourished. No distress.  The patient is pleasant and relaxed and has a somewhat complicated history.  HENT:  Head: Normocephalic and atraumatic.  Right Ear: External ear normal.  Left Ear: External ear normal.  Nose: Nose normal.  Mouth/Throat: No oropharyngeal exudate.  Small ulcer on the upper gum inside the mouth  Eyes: Pupils are equal, round, and reactive to light. Conjunctivae and EOM are normal. Right eye exhibits no discharge. Left eye exhibits no discharge. No scleral icterus.  Neck: Normal range of motion. Neck supple. No thyromegaly present.  Bruits or thyromegaly not present  Cardiovascular: Normal rate, regular rhythm, normal heart sounds and intact distal pulses.  No murmur heard. Heart is regular at 60/min  Pulmonary/Chest: Effort normal and breath sounds normal. No respiratory distress. She has no wheezes. She has no rales. She exhibits no tenderness.  Clear anteriorly and posteriorly    Abdominal: Soft. Bowel sounds are normal. She exhibits no mass. There is no tenderness. There is no rebound and no guarding.  No liver or spleen enlargement or suprapubic tenderness or inguinal adenopathy or masses.  Musculoskeletal: Normal range of motion. She exhibits no edema.  Lymphadenopathy:    She has no cervical adenopathy.  Neurological: She is alert and oriented to person, place, and time. She has normal reflexes. No cranial nerve deficit.  Skin: Skin is warm and dry. No rash noted.  Psychiatric: She has a normal mood and affect. Her behavior is normal. Judgment and thought content normal.  Nursing note and vitals reviewed.   BP 129/65 (BP Location: Left Arm)   Pulse (!) 57   Temp 98.1 F (36.7 C) (Oral)   Ht 5' 0.25" (1.53 m)   Wt 130 lb (59 kg)   BMI 25.18 kg/m        Assessment & Plan:  1. Atrial fibrillation, chronic (HCC) -Continue with current treatment and follow-up with  cardiology as planned  2. Weakness -Continue with antibiotics and get lab work as planned including thyroid profile next week  3. Recent urinary tract infection -Continue and complete antibiotics  4. Hypothyroidism due to acquired atrophy of thyroid -Check thyroid profile at next visit  5. 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 Aortic atherosclerosis ([place]) -Continue aggressive therapeutic lifestyle changes along with cholesterol management  No orders of the defined types were placed in this encounter.  Patient Instructions  Follow-up with cardiology as planned Get repeat urinalysis and blood work as planned along with thyroid profile Use half peroxide and half water and swish in mouth a couple of times daily because of ulcer that is present on the gum to see if this helps any. Continue to drink plenty of fluids and stay well-hydrated If problems continue with the ulcer in the mouth, the patient will  make an appointment to follow-up with her dentist. If the patient continues to get urinary tract infection she will get an appointment with the urologist. She should continue to drink plenty of fluids and stay well-hydrated Continue to monitor pulse rates and blood pressures and try to correlate these with her episodes of weakness  Arrie Senate MD

## 2018-02-20 NOTE — Patient Instructions (Signed)
Follow-up with cardiology as planned Get repeat urinalysis and blood work as planned along with thyroid profile Use half peroxide and half water and swish in mouth a couple of times daily because of ulcer that is present on the gum to see if this helps any. Continue to drink plenty of fluids and stay well-hydrated If problems continue with the ulcer in the mouth, the patient will make an appointment to follow-up with her dentist. If the patient continues to get urinary tract infection she will get an appointment with the urologist. She should continue to drink plenty of fluids and stay well-hydrated Continue to monitor pulse rates and blood pressures and try to correlate these with her episodes of weakness

## 2018-02-23 ENCOUNTER — Ambulatory Visit (HOSPITAL_COMMUNITY): Payer: Medicare Other | Admitting: Nurse Practitioner

## 2018-02-24 ENCOUNTER — Other Ambulatory Visit: Payer: Medicare Other

## 2018-02-24 DIAGNOSIS — I10 Essential (primary) hypertension: Secondary | ICD-10-CM | POA: Diagnosis not present

## 2018-02-24 DIAGNOSIS — E034 Atrophy of thyroid (acquired): Secondary | ICD-10-CM | POA: Diagnosis not present

## 2018-02-24 DIAGNOSIS — R531 Weakness: Secondary | ICD-10-CM

## 2018-02-24 DIAGNOSIS — Z87440 Personal history of urinary (tract) infections: Secondary | ICD-10-CM

## 2018-02-24 DIAGNOSIS — I482 Chronic atrial fibrillation, unspecified: Secondary | ICD-10-CM

## 2018-02-24 NOTE — Progress Notes (Signed)
HPI The patient presents for evaluation of atrial fibrillation. She was cardioverted.  However, she developed recurrent atrial fib.  She had her flecainide does reduced because of a prolonged QT interval.  The flecainide was subsequently discontinued as she had reverted to atrial fib.   She has been followed in the atrial fib clinic.  She did describe chest pain and was sent for [facility] which did not suggest ischemia.  She does have a mildly reduced EF (40 - 45% on echo.) Her last echo in March 2019 demonstrated an improved EF of 55 - 60%.    She was started on amiodarone and underwent DCCV.  She called recently with epidsodes of weak spells.  She has been in and out of fib but she does not think that this correlates with her sensation.  She was told over the phone to reduce her pindolol.     She has not been golfing since she reduced her pindolol.  That seems to be when she gets the symptoms.  She will feel fatigued suddenly.  She actually did this in January when she was eating in the restaurant below our office and she came upstairs and was seen by Dr. Oval Linsey.  She was not found to have any dysrhythmia at that time her diltiazem dose was reduced.  She is now episodically getting the spells of weakness.  She is not able to do as many holes of golfing as she would like.  She forced herself to do 7 recently and threw up on the seventh hole.    She does not feel her heart racing.  She notes it to be low at times.  She is not describing any chest pressure, neck or arm discomfort though there might be a slight fullness when she starts feeling like that.  She did have a negative stress test in 2018.  She has not had any new shortness of breath, PND or apnea.  She has not had any weight gain or edema.  Allergies  Allergen Reactions  . Benazepril Hcl Cough    Current Outpatient Medications  Medication Sig Dispense Refill  . amiodarone (PACERONE) 200 MG tablet Take 200 mg by mouth daily.      . calcium-vitamin D (OSCAL WITH D) 250-125 MG-UNIT per tablet Take 1 tablet by mouth 2 (two) times daily.    Marland Kitchen diltiazem (CARDIZEM CD) 120 MG 24 hr capsule Take 1 capsule (120 mg total) by mouth daily. 90 capsule 3  . ELIQUIS 2.5 MG TABS tablet TAKE 1 TABLET BY MOUTH 2  TIMES DAILY. PUT ON HOLD  UNTIL PATIENT REQUESTS. 923 tablet 1  . folic acid (FOLVITE) 1 MG tablet Take 1 mg by mouth daily.    . furosemide (LASIX) 40 MG tablet Take 1 tablet (40 mg total) by mouth daily. 90 tablet 3  . gabapentin (NEURONTIN) 100 MG capsule Take 100 mg by mouth daily as needed.    Marland Kitchen levothyroxine (SYNTHROID, LEVOTHROID) 75 MCG tablet Take 1 tablet (75 mcg total) by mouth daily. 90 tablet 1  . lisinopril (PRINIVIL,ZESTRIL) 40 MG tablet TAKE 1 TABLET BY MOUTH  DAILY 90 tablet 0  . Melatonin 5 MG TABS Take 5 mg by mouth at bedtime.    . methotrexate (RHEUMATREX) 2.5 MG tablet Take 10 mg by mouth once a week.     . Multiple Vitamin (MULTIVITAMIN) capsule Take 1 capsule by mouth daily.    . pindolol (VISKEN) 10 MG tablet Take 10 mg  by mouth 2 (two) times daily.    Marland Kitchen spironolactone (ALDACTONE) 25 MG tablet Take 1 tablet (25 mg total) by mouth daily. 90 tablet 3   No current facility-administered medications for this visit.     Past Medical History:  Diagnosis Date  . Arthritis   . Bradycardia 2014  . Cataract   . Diverticulosis   . Dyspnea    periodically  . Dysrhythmia    a - fib  . Heart murmur    ??? bruit  . History of stress test    a. 04/09/13-nuclear stress-no ischemia low risk  . Hypertension   . Menopause   . Osteoporosis   . PAF (paroxysmal atrial fibrillation) ([place])    a. s/p prior DCCV; b. On flecainide & coumadin (CHA2DS2VASc = 4);  c. 03/2016 Echo: EF 55-60%, mod LVH, mod AI, mild to mod TR, PASP 84mmHg.  Marland Kitchen Pelvic fracture [facility])     Past Surgical History:  Procedure Laterality Date  . BIOPSY BREAST    . BIOPSY BREAST     right & benign  . BREAST SURGERY    . CARDIOVERSION N/A  10/30/2016   Procedure: CARDIOVERSION;  Surgeon: Larey Dresser, MD;  Location: [place];  Service: Cardiovascular;  Laterality: N/A;  . CARDIOVERSION N/A 08/20/2017   Procedure: CARDIOVERSION;  Surgeon: Jerline Pain, MD;  Location: [facility] ENDOSCOPY;  Service: Cardiovascular;  Laterality: N/A;  . CATARACT EXTRACTION    . EYE SURGERY     cataracts  . TEE WITHOUT CARDIOVERSION N/A 10/30/2016   Procedure: TRANSESOPHAGEAL ECHOCARDIOGRAM (TEE);  Surgeon: Larey Dresser, MD;  Location: [place];  Service: Cardiovascular;  Laterality: N/A;  . TOTAL HIP ARTHROPLASTY Right 01/27/2017   Procedure: TOTAL HIP ARTHROPLASTY ANTERIOR APPROACH;  Surgeon: Rod Can, MD;  Location: [place];  Service: Orthopedics;  Laterality: Right;    ROS:  As stated in the HPI and negative for all other systems.   PHYSICAL EXAM BP 128/60   Pulse 60   Ht 5' 0.25" (1.53 m)   Wt 130 lb (59 kg)   BMI 25.18 kg/m   GENERAL:  Well appearing NECK:  No jugular venous distention, waveform within normal limits, carotid upstroke brisk and symmetric, no bruits, no thyromegaly LUNGS:  Clear to auscultation bilaterally CHEST:  Unremarkable HEART:  PMI not displaced or sustained,S1 and S2 within normal limits, no S3, no S4, no clicks, no rubs,  2/6 brief apical systolic murmur, no diastolic murmurs ABD:  Flat, positive bowel sounds normal in frequency in pitch, no bruits, no rebound, no guarding, no midline pulsatile mass, no hepatomegaly, no splenomegaly EXT:  2 plus pulses throughout, trace edema, no cyanosis no clubbing   EKG:  Atrial fibrillation, 58, left axis deviation, poor anterior R wave progression, no acute ST-T wave changes.  02/25/2018   Lab Results  Component Value Date   TSH 1.720 02/24/2018   ALT 27 02/24/2018   AST 35 02/24/2018   ALKPHOS 80 02/24/2018   BILITOT 0.3 02/24/2018   PROT 6.5 02/24/2018   ALBUMIN 3.6 02/24/2018     ASSESSMENT AND PLAN  HTN:   Blood pressure is slightly elevated on  the lower dose of atenolol but at this point I am not going to make any changes to that as I am thinking that she probably will not tolerate the pindolol at all with low heart rates.  ATRIAL FIB:   Susan Fitzgerald has a CHA2DS2 - VASc score of 4 with a  risk of stroke of 4%.    She is in regular rhythm.    She is up-to-date with her amiodarone labs.  I am going to have her wear a Holter and go golfing to see what happens with her heart rate.  It may be that we had to discontinue the pindolol altogether.  WEAKNESS: I will start with evaluation as above.  However, I will consider further workup if there is no clear etiology and she does not improve with this reduced dose of pindolol or complete removal of the pindolol.

## 2018-02-25 ENCOUNTER — Ambulatory Visit: Payer: Medicare Other | Admitting: Cardiology

## 2018-02-25 ENCOUNTER — Ambulatory Visit: Payer: Medicare Other | Admitting: *Deleted

## 2018-02-25 ENCOUNTER — Encounter: Payer: Self-pay | Admitting: Cardiology

## 2018-02-25 VITALS — BP 128/60 | HR 60 | Ht 60.25 in | Wt 130.0 lb

## 2018-02-25 DIAGNOSIS — R531 Weakness: Secondary | ICD-10-CM | POA: Diagnosis not present

## 2018-02-25 DIAGNOSIS — I10 Essential (primary) hypertension: Secondary | ICD-10-CM | POA: Diagnosis not present

## 2018-02-25 DIAGNOSIS — I482 Chronic atrial fibrillation, unspecified: Secondary | ICD-10-CM

## 2018-02-25 LAB — THYROID PANEL WITH TSH
Free Thyroxine Index: 3.6 (ref 1.2–4.9)
T3 Uptake Ratio: 31 % (ref 24–39)
T4, Total: 11.5 ug/dL (ref 4.5–12.0)
TSH: 1.72 u[IU]/mL (ref 0.450–4.500)

## 2018-02-25 LAB — BMP8+EGFR
BUN/Creatinine Ratio: 17 (ref 12–28)
BUN: 25 mg/dL (ref 8–27)
CHLORIDE: 103 mmol/L (ref 96–106)
CO2: 25 mmol/L (ref 20–29)
Calcium: 9.3 mg/dL (ref 8.7–10.3)
Creatinine, Ser: 1.47 mg/dL — ABNORMAL HIGH (ref 0.57–1.00)
GFR calc Af Amer: 38 mL/min/{1.73_m2} — ABNORMAL LOW (ref >59)
GFR, EST NON AFRICAN AMERICAN: 33 mL/min/{1.73_m2} — AB (ref >59)
GLUCOSE: 82 mg/dL (ref 65–99)
POTASSIUM: 4.4 mmol/L (ref 3.5–5.2)
SODIUM: 140 mmol/L (ref 134–144)

## 2018-02-25 LAB — CBC WITH DIFFERENTIAL/PLATELET
Basophils Absolute: 0 10*3/uL (ref 0.0–0.2)
Basos: 1 %
EOS (ABSOLUTE): 0.2 10*3/uL (ref 0.0–0.4)
EOS: 5 %
HEMATOCRIT: 35.5 % (ref 34.0–46.6)
Hemoglobin: 11.8 g/dL (ref 11.1–15.9)
IMMATURE GRANS (ABS): 0 10*3/uL (ref 0.0–0.1)
IMMATURE GRANULOCYTES: 0 %
Lymphocytes Absolute: 1 10*3/uL (ref 0.7–3.1)
Lymphs: 25 %
MCH: 33.1 pg — AB (ref 26.6–33.0)
MCHC: 33.2 g/dL (ref 31.5–35.7)
MCV: 99 fL — AB (ref 79–97)
MONOS ABS: 0.7 10*3/uL (ref 0.1–0.9)
Monocytes: 16 %
NEUTROS ABS: 2.2 10*3/uL (ref 1.4–7.0)
NEUTROS PCT: 53 %
Platelets: 174 10*3/uL (ref 150–379)
RBC: 3.57 x10E6/uL — ABNORMAL LOW (ref 3.77–5.28)
RDW: 15.6 % — ABNORMAL HIGH (ref 12.3–15.4)
WBC: 4.2 10*3/uL (ref 3.4–10.8)

## 2018-02-25 LAB — HEPATIC FUNCTION PANEL
ALT: 27 IU/L (ref 0–32)
AST: 35 IU/L (ref 0–40)
Albumin: 3.6 g/dL (ref 3.5–4.7)
Alkaline Phosphatase: 80 IU/L (ref 39–117)
BILIRUBIN TOTAL: 0.3 mg/dL (ref 0.0–1.2)
BILIRUBIN, DIRECT: 0.09 mg/dL (ref 0.00–0.40)
Total Protein: 6.5 g/dL (ref 6.0–8.5)

## 2018-02-25 LAB — URINE CULTURE: ORGANISM ID, BACTERIA: NO GROWTH

## 2018-02-25 NOTE — Progress Notes (Signed)
Holter monitor placed Asset # [id number] 

## 2018-02-25 NOTE — Patient Instructions (Signed)
Medication Instructions:  The current medical regimen is effective;  continue present plan and medications.  Testing/Procedures: Your physician has recommended that you wear a holter monitor for 48 hours. Holter monitors are medical devices that record the heart's electrical activity. Doctors most often use these monitors to diagnose arrhythmias. Arrhythmias are problems with the speed or rhythm of the heartbeat. The monitor is a small, portable device. You can wear one while you do your normal daily activities. This is usually used to diagnose what is causing palpitations/syncope (passing out).  Follow-Up: Follow up with Dr Percival Spanish after the above testing.  If you need a refill on your cardiac medications before your next appointment, please call your pharmacy.  Thank you for choosing [facility]!!

## 2018-02-26 DIAGNOSIS — R002 Palpitations: Secondary | ICD-10-CM | POA: Diagnosis not present

## 2018-03-02 ENCOUNTER — Other Ambulatory Visit: Payer: Self-pay | Admitting: Family Medicine

## 2018-03-02 ENCOUNTER — Other Ambulatory Visit: Payer: Self-pay | Admitting: *Deleted

## 2018-03-02 DIAGNOSIS — I482 Chronic atrial fibrillation, unspecified: Secondary | ICD-10-CM

## 2018-03-10 ENCOUNTER — Other Ambulatory Visit (HOSPITAL_COMMUNITY): Payer: Self-pay | Admitting: Nurse Practitioner

## 2018-03-11 ENCOUNTER — Telehealth: Payer: Self-pay | Admitting: Cardiology

## 2018-03-11 NOTE — Telephone Encounter (Signed)
Follow Up:    Please call,still having episodes with her heart rate and blood pressure dropping.

## 2018-03-11 NOTE — Telephone Encounter (Signed)
Returned the call to the patient. She stated that she was still having episodes of low heart rate and blood pressure. She stated that a few days ago she was able to play 18 holes of golf and felt pretty good. Yesterday she was only able to play 5 holes and she felt dizzy. She denies chest pain and shortness of breath.  This morning after she felt dizzy again she checked her blood pressure and heart rate.   This morning: 101/42 HR 40 (after medications)  At 1 pm           107/74 HR 56  Morning medications: Amiodarone 200 mg Diltiazem 120 mg daily Lisinopril 40 mg Furosemide 20 mg Spironolactone 25 mg Pindolol 10 mg bid  She stated that this heart rate and blood pressure is typical for her and that her heart rate never gets above 60. The patient would like to know if she should stop taking the pindolol 10 mg bid as discussed at her last office visit. She stated that some nights she does not take it due to a low heart rate and blood pressure. Per DOD, Dr. Debara Pickett, she can stop the pindolol. She will call back with updated blood pressures and heart rates. She has verbalized her understanding. Message routed to Dr. Percival Spanish for his information.

## 2018-03-15 NOTE — Telephone Encounter (Signed)
We did not capture the rhythm on Holter.  Susan Fitzgerald will need a 21 day event monitor.  The patients symptoms necessitate an event monitor.  The symptoms are too infrequent to be identified on a Holter monitor.

## 2018-03-18 NOTE — Telephone Encounter (Signed)
Call and spoke with pt about wear a monitor, pt stated she lived in [place] and want to know if Dr Laurance Flatten can put the monitor on her, advised pt ill call Dr Laurance Flatten tomorrow to see if monitor can be put on at his office

## 2018-03-19 ENCOUNTER — Telehealth: Payer: Self-pay | Admitting: Cardiology

## 2018-03-19 DIAGNOSIS — R002 Palpitations: Secondary | ICD-10-CM

## 2018-03-19 DIAGNOSIS — I482 Chronic atrial fibrillation, unspecified: Secondary | ICD-10-CM

## 2018-03-19 NOTE — Telephone Encounter (Signed)
Follow up: ° ° °Patient returning call  ° ° ° °

## 2018-03-19 NOTE — Telephone Encounter (Signed)
New message  Pt verbalzied that she is calling for RN  About getting her monitor put on at [facility]

## 2018-03-19 NOTE — Telephone Encounter (Signed)
Spoke with pt who states she decided she prefer to [address] office for monitor so she would not have to wear leads. Staff message sent to admin pool to have appointment set up.

## 2018-03-19 NOTE — Telephone Encounter (Signed)
Left message to call back  

## 2018-03-20 NOTE — Telephone Encounter (Signed)
Spoke with Dr Laurance Flatten office and they will be happy to placed monitor on pt, pt made aware that someone from Dr Laurance Flatten office will be calling

## 2018-03-20 NOTE — Telephone Encounter (Signed)
March 15, 2018   Minus Breeding, MD   8:48 AM  Note    We did not capture the rhythm on Holter.  Susan Fitzgerald will need a 21 day event monitor.  The patients symptoms necessitate an event monitor.  The symptoms are too infrequent to be identified on a Holter monitor.

## 2018-03-20 NOTE — Telephone Encounter (Signed)
Returned call to patient. She states she just spoke with scheduler about her monitor appt and was under the impression it was for 21 days but the appointment showed up in her MyChart account as a 48 hour holter. Advised this is incorrect per MD note and I would have the scheduler change this.   Order entered correctly for 21 day event monitor

## 2018-03-20 NOTE — Telephone Encounter (Signed)
Follow up    Patient calling to confirm if her monitor should be for 48hr or 21 days

## 2018-03-23 ENCOUNTER — Ambulatory Visit: Payer: Medicare Other | Admitting: Family Medicine

## 2018-03-30 ENCOUNTER — Ambulatory Visit (INDEPENDENT_AMBULATORY_CARE_PROVIDER_SITE_OTHER): Payer: Medicare Other

## 2018-03-30 ENCOUNTER — Encounter

## 2018-03-30 DIAGNOSIS — I482 Chronic atrial fibrillation: Secondary | ICD-10-CM | POA: Diagnosis not present

## 2018-03-30 DIAGNOSIS — R002 Palpitations: Secondary | ICD-10-CM | POA: Diagnosis not present

## 2018-04-03 ENCOUNTER — Other Ambulatory Visit: Payer: Self-pay | Admitting: *Deleted

## 2018-04-03 MED ORDER — LISINOPRIL 40 MG PO TABS: 40.0000 mg | ORAL_TABLET | Freq: Every day | ORAL | 0 refills | Status: DC

## 2018-04-04 ENCOUNTER — Other Ambulatory Visit: Payer: Self-pay | Admitting: Family Medicine

## 2018-04-15 ENCOUNTER — Encounter: Payer: Self-pay | Admitting: Family Medicine

## 2018-04-15 ENCOUNTER — Ambulatory Visit (INDEPENDENT_AMBULATORY_CARE_PROVIDER_SITE_OTHER): Payer: Medicare Other

## 2018-04-15 ENCOUNTER — Ambulatory Visit (INDEPENDENT_AMBULATORY_CARE_PROVIDER_SITE_OTHER): Payer: Medicare Other | Admitting: Family Medicine

## 2018-04-15 VITALS — BP 119/63 | HR 65 | Temp 98.5°F | Wt 127.0 lb

## 2018-04-15 DIAGNOSIS — M940 Chondrocostal junction syndrome [Tietze]: Secondary | ICD-10-CM

## 2018-04-15 DIAGNOSIS — R079 Chest pain, unspecified: Secondary | ICD-10-CM | POA: Diagnosis not present

## 2018-04-15 MED ORDER — DICLOFENAC SODIUM 1 % TD GEL: 2.0000 g | Freq: Four times a day (QID) | TRANSDERMAL | 1 refills | Status: DC

## 2018-04-15 NOTE — Progress Notes (Signed)
BP 119/63 (BP Location: Left Arm, Patient Position: Sitting, Cuff Size: Normal)   Pulse 65   Temp 98.5 F (36.9 C) (Oral)   Wt 127 lb (57.6 kg)   BMI 24.60 kg/m    Subjective:    Patient ID: Susan Fitzgerald, female    DOB: 11/03/1932, 84 y.o.   MRN: [id number]  HPI: Susan Fitzgerald is a 84 y.o. female presenting on 04/15/2018 for Chest Pain (started yesterday)   HPI Chest pain Patient comes in today complaining of chest pain that she describes as substernal and central chest pain that is worse with deep inspiration and bending forward and certain movements of her upper torso.  She said that started yesterday and cannot recall any specific incident or what she was doing when it started.  She denies any fevers or chills or shortness of breath or wheezing or coughing.  She denies any pain radiating anywhere else.  She describes the pain as a sharp pain when she has it but at rest she does not have it.  Relevant past medical, surgical, family and social history reviewed and updated as indicated. Interim medical history since our last visit reviewed. Allergies and medications reviewed and updated.  Review of Systems  Constitutional: Negative for chills and fever.  Eyes: Negative for redness and visual disturbance.  Respiratory: Negative for chest tightness and shortness of breath.   Cardiovascular: Positive for chest pain (Anterior chest wall pain). Negative for leg swelling.  Musculoskeletal: Negative for back pain and gait problem.  Skin: Negative for rash.  Neurological: Negative for light-headedness and headaches.  Psychiatric/Behavioral: Negative for agitation and behavioral problems.  All other systems reviewed and are negative.   Per HPI unless specifically indicated above   Allergies as of 04/15/2018      Reactions   Benazepril Hcl Cough      Medication List        Accurate as of 04/15/18  5:02 PM. Always use your most recent med list.          amiodarone 200 MG  tablet Commonly known as:  PACERONE Take 200 mg by mouth daily.   calcium-vitamin D 250-125 MG-UNIT tablet Commonly known as:  OSCAL WITH D Take 1 tablet by mouth 2 (two) times daily.   diltiazem 120 MG 24 hr capsule Commonly known as:  CARDIZEM CD Take 1 capsule (120 mg total) by mouth daily.   ELIQUIS 2.5 MG Tabs tablet Generic drug:  apixaban TAKE 1 TABLET BY MOUTH 2  TIMES DAILY. PUT ON HOLD  UNTIL PATIENT REQUESTS.   folic acid 1 MG tablet Commonly known as:  FOLVITE Take 1 mg by mouth daily.   furosemide 40 MG tablet Commonly known as:  LASIX Take 1 tablet (40 mg total) by mouth daily.   furosemide 20 MG tablet Commonly known as:  LASIX TAKE 1 TO 2 TABLETS BY  MOUTH DAILY AS NEEDED   gabapentin 100 MG capsule Commonly known as:  NEURONTIN Take 100 mg by mouth daily as needed.   levothyroxine 75 MCG tablet Commonly known as:  SYNTHROID, LEVOTHROID TAKE 1 TABLET BY MOUTH  DAILY   lisinopril 40 MG tablet Commonly known as:  PRINIVIL,ZESTRIL Take 1 tablet (40 mg total) by mouth daily.   Melatonin 5 MG Tabs Take 5 mg by mouth at bedtime.   methotrexate 2.5 MG tablet Commonly known as:  RHEUMATREX Take 10 mg by mouth once a week.   multivitamin capsule Take 1 capsule by mouth  daily.   spironolactone 25 MG tablet Commonly known as:  ALDACTONE Take 1 tablet (25 mg total) by mouth daily.          Objective:    BP 119/63 (BP Location: Left Arm, Patient Position: Sitting, Cuff Size: Normal)   Pulse 65   Temp 98.5 F (36.9 C) (Oral)   Wt 127 lb (57.6 kg)   BMI 24.60 kg/m   Wt Readings from Last 3 Encounters:  04/15/18 127 lb (57.6 kg)  02/25/18 130 lb (59 kg)  02/20/18 130 lb (59 kg)    Physical Exam  Constitutional: She is oriented to person, place, and time. She appears well-developed and well-nourished. No distress.  Eyes: Conjunctivae are normal.  Cardiovascular: Normal rate, regular rhythm, normal heart sounds and intact distal pulses.  No  murmur heard. Pulmonary/Chest: Effort normal and breath sounds normal. No respiratory distress. She has no wheezes. She exhibits tenderness (Anterior chest wall tenderness over lower sternum, reproducible on exam).  Musculoskeletal: Normal range of motion. She exhibits no edema or tenderness.  Neurological: She is alert and oriented to person, place, and time. Coordination normal.  Skin: Skin is warm and dry. No rash noted. She is not diaphoretic.  Psychiatric: She has a normal mood and affect. Her behavior is normal.  Nursing note and vitals reviewed.   EKG: Normal sinus rhythm, slight LVH otherwise no change from previous EKG  Chest x-ray: No acute cardiopulmonary abnormalities    Assessment & Plan:   Problem List Items Addressed This Visit    None    Visit Diagnoses    Costochondritis    -  Primary   Relevant Orders   EKG 12-Lead (Completed)   DG Chest 2 View    Patient is currently wearing an event monitor to be monitored by cardiology.  No signs of acute cardiac abnormality on EKG today, reproducible chest pain, likely costochondritis, recommended topical anti-inflammatory because patient is currently on blood thinners already.  Follow up plan: Return if symptoms worsen or fail to improve.  Counseling provided for all of the vaccine components Orders Placed This Encounter  Procedures  . DG Chest 2 View  . EKG 12-Lead    Caryl Pina, MD [facility] 04/15/2018, 5:02 PM

## 2018-04-20 ENCOUNTER — Telehealth: Payer: Self-pay | Admitting: Family Medicine

## 2018-04-20 MED ORDER — CEFDINIR 300 MG PO CAPS: 300.0000 mg | ORAL_CAPSULE | Freq: Two times a day (BID) | ORAL | 0 refills | Status: DC

## 2018-04-20 NOTE — Telephone Encounter (Signed)
Can she have something else to clear up the infection? Can she expect this to resolve fairly soon?  Script should be sent to [facility],, (does not need anything for the nausea, has medication for that if needed).

## 2018-04-20 NOTE — Telephone Encounter (Signed)
Please let patient know that I sent [place] for her.  Let us know if she continues to have issues

## 2018-04-20 NOTE — Telephone Encounter (Signed)
Aware. 

## 2018-04-20 NOTE — Telephone Encounter (Signed)
Pharmacy discussed with patient.

## 2018-04-22 ENCOUNTER — Telehealth: Payer: Self-pay | Admitting: Cardiology

## 2018-04-22 NOTE — Telephone Encounter (Signed)
Spoke with pt, aware monitor results are not available yet.

## 2018-04-22 NOTE — Telephone Encounter (Signed)
New Message:    Pt wants to know if her monitor results are ready please.

## 2018-04-29 ENCOUNTER — Telehealth: Payer: Self-pay | Admitting: Cardiology

## 2018-04-29 NOTE — Telephone Encounter (Signed)
New Message    Patient is calling to see whether or not her holter monitor results are available. Please call to discuss.

## 2018-04-29 NOTE — Telephone Encounter (Signed)
Does not look as though this has been reviewed by Dr Percival Spanish will forward to him for review

## 2018-04-30 ENCOUNTER — Telehealth: Payer: Self-pay | Admitting: Family Medicine

## 2018-04-30 NOTE — Telephone Encounter (Signed)
Pt is chest is sore not chest pain. (like muscle pain). Pt has seen Dettinger for it. Pt is asking to speak with Jamie B. Please advise. She wants to know how long it was going to last. Pt had chest xray and EKG and it was all normal.

## 2018-04-30 NOTE — Telephone Encounter (Signed)
DX: costochondritis . She feels she should be better now and wants [facility] to know about this

## 2018-04-30 NOTE — Telephone Encounter (Signed)
This should be getting better.  She should continue with warm wet compresses and routine walking activity without any heavy lifting or swinging the golf club.  She cannot take anti-inflammatory medicines.  She can take Tylenol if needed 3 or 4 times daily.  If she is not better in the next 7 to 10 days we can get some x-rays of the chest wall at that time.

## 2018-04-30 NOTE — Telephone Encounter (Signed)
appt made with Dettinger tomorrow

## 2018-04-30 NOTE — Telephone Encounter (Signed)
Result was reviewed in MyChart

## 2018-05-01 ENCOUNTER — Ambulatory Visit (INDEPENDENT_AMBULATORY_CARE_PROVIDER_SITE_OTHER): Payer: Medicare Other | Admitting: Family Medicine

## 2018-05-01 ENCOUNTER — Encounter: Payer: Self-pay | Admitting: Family Medicine

## 2018-05-01 VITALS — BP 136/73 | HR 61 | Temp 98.1°F | Ht 60.25 in | Wt 126.0 lb

## 2018-05-01 DIAGNOSIS — S22080A Wedge compression fracture of T11-T12 vertebra, initial encounter for closed fracture: Secondary | ICD-10-CM | POA: Diagnosis not present

## 2018-05-01 DIAGNOSIS — M940 Chondrocostal junction syndrome [Tietze]: Secondary | ICD-10-CM | POA: Diagnosis not present

## 2018-05-01 MED ORDER — PREDNISONE 20 MG PO TABS: ORAL_TABLET | ORAL | 0 refills | Status: DC

## 2018-05-01 NOTE — Progress Notes (Signed)
BP 136/73   Pulse 61   Temp 98.1 F (36.7 C) (Oral)   Ht 5' 0.25" (1.53 m)   Wt 126 lb (57.2 kg)   BMI 24.40 kg/m    Subjective:    Patient ID: Susan Fitzgerald, female    DOB: 06-20-1932, 84 y.o.   MRN: [id number]  HPI: Susan Fitzgerald is a 84 y.o. female presenting on 05/01/2018 for Still having pain in chest (pain seems to be spreading across chest, patient wondering if she just needs to give it more time)   HPI Chest wall pain and rib pain Patient continues to have anterior chest wall pain and rib pain that has been bothering her over the past few weeks.  It started when she fell against a golf cart and hit that part of her chest against it.  She has been using topical anti-inflammatories because she cannot use oral once because of her Eliquis and does not feel like they have helped much and seems to be the same.  She now feels like the pain is spreading across the chest between her ribs and especially more on the right side than left which is where she hit herself.  She denies any fevers or chills or shortness of breath or wheezing.  The pain is worse with deep inspiration coughing sneezing and some movements of twisting.  Relevant past medical, surgical, family and social history reviewed and updated as indicated. Interim medical history since our last visit reviewed. Allergies and medications reviewed and updated.  Review of Systems  Constitutional: Negative for chills and fever.  Eyes: Negative for visual disturbance.  Respiratory: Positive for chest tightness. Negative for shortness of breath.   Cardiovascular: Positive for chest pain. Negative for leg swelling.  Gastrointestinal: Negative for abdominal pain.  Musculoskeletal: Negative for back pain and gait problem.  Skin: Negative for rash.  Neurological: Negative for light-headedness and headaches.  Psychiatric/Behavioral: Negative for agitation and behavioral problems.  All other systems reviewed and are  negative.   Per HPI unless specifically indicated above   Allergies as of 05/01/2018      Reactions   Benazepril Hcl Cough      Medication List        Accurate as of 05/01/18  9:46 AM. Always use your most recent med list.          amiodarone 200 MG tablet Commonly known as:  PACERONE Take 200 mg by mouth daily.   calcium-vitamin D 250-125 MG-UNIT tablet Commonly known as:  OSCAL WITH D Take 1 tablet by mouth 2 (two) times daily.   diclofenac sodium 1 % Gel Commonly known as:  VOLTAREN Apply 2 g topically 4 (four) times daily.   diltiazem 120 MG 24 hr capsule Commonly known as:  CARDIZEM CD Take 1 capsule (120 mg total) by mouth daily.   ELIQUIS 2.5 MG Tabs tablet Generic drug:  apixaban TAKE 1 TABLET BY MOUTH 2  TIMES DAILY. PUT ON HOLD  UNTIL PATIENT REQUESTS.   folic acid 1 MG tablet Commonly known as:  FOLVITE Take 1 mg by mouth daily.   furosemide 20 MG tablet Commonly known as:  LASIX TAKE 1 TO 2 TABLETS BY  MOUTH DAILY AS NEEDED   gabapentin 100 MG capsule Commonly known as:  NEURONTIN Take 100 mg by mouth daily as needed.   levothyroxine 75 MCG tablet Commonly known as:  SYNTHROID, LEVOTHROID TAKE 1 TABLET BY MOUTH  DAILY   lisinopril 40 MG tablet Commonly  known as:  PRINIVIL,ZESTRIL Take 1 tablet (40 mg total) by mouth daily.   Melatonin 5 MG Tabs Take 5 mg by mouth at bedtime.   methotrexate 2.5 MG tablet Commonly known as:  RHEUMATREX Take 10 mg by mouth once a week.   multivitamin capsule Take 1 capsule by mouth daily.   predniSONE 20 MG tablet Commonly known as:  DELTASONE Take 3 tabs daily for 1 week, then 2 tabs daily for week 2, then 1 tab daily for week 3.   spironolactone 25 MG tablet Commonly known as:  ALDACTONE Take 1 tablet (25 mg total) by mouth daily.          Objective:    BP 136/73   Pulse 61   Temp 98.1 F (36.7 C) (Oral)   Ht 5' 0.25" (1.53 m)   Wt 126 lb (57.2 kg)   BMI 24.40 kg/m   Wt Readings from  Last 3 Encounters:  05/01/18 126 lb (57.2 kg)  04/15/18 127 lb (57.6 kg)  02/25/18 130 lb (59 kg)    Physical Exam  Constitutional: She is oriented to person, place, and time. She appears well-developed and well-nourished. No distress.  Eyes: Pupils are equal, round, and reactive to light. Conjunctivae and EOM are normal.  Cardiovascular: Normal rate, regular rhythm, normal heart sounds and intact distal pulses.  No murmur heard. Pulmonary/Chest: Effort normal and breath sounds normal. No respiratory distress. She has no wheezes. She exhibits tenderness (Reproducible chest wall tenderness overlying the sternum and going into the right side of her rib cage in about T4-T6 region.).  Neurological: She is alert and oriented to person, place, and time. Coordination normal.  Skin: Skin is warm and dry. No rash noted. She is not diaphoretic.  Psychiatric: She has a normal mood and affect. Her behavior is normal.  Nursing note and vitals reviewed.       Assessment & Plan:   Problem List Items Addressed This Visit    None    Visit Diagnoses    Costochondritis    -  Primary   Relevant Medications   predniSONE (DELTASONE) 20 MG tablet   Closed wedge compression fracture of eleventh thoracic vertebra, initial encounter [facility])       Patient will call her neurosurgeon and if she cannot get and she will call for referral.  Was seen on x-ray of the chest.  Looked like it had been there      Topical anti-inflammatory did not help and she is still having pain, will do short course of prednisone with taper.  Follow up plan: Return if symptoms worsen or fail to improve.  Counseling provided for all of the vaccine components No orders of the defined types were placed in this encounter.   Caryl Pina, MD [facility] 05/01/2018, 9:46 AM

## 2018-05-11 ENCOUNTER — Telehealth: Payer: Self-pay | Admitting: Cardiology

## 2018-05-11 NOTE — Telephone Encounter (Signed)
Pt calling to get more detailed results from Heart Monitor, was told it was not significant, but would like more information pls call

## 2018-05-13 NOTE — Telephone Encounter (Signed)
Spoke with pt about her holter monitor

## 2018-05-14 ENCOUNTER — Telehealth: Payer: Self-pay | Admitting: Cardiology

## 2018-05-14 NOTE — Telephone Encounter (Signed)
New Message    Patient is calling about her holter monitor results. Please call.

## 2018-05-19 NOTE — Telephone Encounter (Signed)
Spoke with pt about her monitor

## 2018-05-22 ENCOUNTER — Other Ambulatory Visit: Payer: Self-pay | Admitting: Family Medicine

## 2018-05-25 DIAGNOSIS — M5030 Other cervical disc degeneration, unspecified cervical region: Secondary | ICD-10-CM | POA: Diagnosis not present

## 2018-05-25 DIAGNOSIS — M5136 Other intervertebral disc degeneration, lumbar region: Secondary | ICD-10-CM | POA: Diagnosis not present

## 2018-05-25 DIAGNOSIS — R7989 Other specified abnormal findings of blood chemistry: Secondary | ICD-10-CM | POA: Diagnosis not present

## 2018-05-25 DIAGNOSIS — M0579 Rheumatoid arthritis with rheumatoid factor of multiple sites without organ or systems involvement: Secondary | ICD-10-CM | POA: Diagnosis not present

## 2018-05-26 ENCOUNTER — Telehealth: Payer: Self-pay | Admitting: Family Medicine

## 2018-05-26 NOTE — Telephone Encounter (Signed)
From nursing:  was in office drew labs creatinin is 2.05 which is elvated, have faxed labs and office note wants to make aware that it's faxed and it's elevated wants help deciding where it is coming from she is aware as well, thinks pt needs appt to assist to find out why it is elevated    Covering for PCP  Reasons that she could have declining renal function.  I would recommend follow-up as soon as possible to recheck labs, urine testing, and review medications.  Laroy Apple, MD [facility] 05/26/2018, 4:58 PM

## 2018-05-26 NOTE — Telephone Encounter (Signed)
FYI- covering PCP

## 2018-05-27 ENCOUNTER — Encounter: Payer: Self-pay | Admitting: Family Medicine

## 2018-05-27 ENCOUNTER — Ambulatory Visit (INDEPENDENT_AMBULATORY_CARE_PROVIDER_SITE_OTHER): Payer: Medicare Other | Admitting: Family Medicine

## 2018-05-27 VITALS — BP 136/68 | HR 78 | Temp 100.7°F | Ht 60.25 in | Wt 125.0 lb

## 2018-05-27 DIAGNOSIS — R748 Abnormal levels of other serum enzymes: Secondary | ICD-10-CM | POA: Diagnosis not present

## 2018-05-27 DIAGNOSIS — K625 Hemorrhage of anus and rectum: Secondary | ICD-10-CM | POA: Diagnosis not present

## 2018-05-27 DIAGNOSIS — S81812A Laceration without foreign body, left lower leg, initial encounter: Secondary | ICD-10-CM | POA: Diagnosis not present

## 2018-05-27 LAB — URINALYSIS, COMPLETE
BILIRUBIN UA: NEGATIVE
Glucose, UA: NEGATIVE
Ketones, UA: NEGATIVE
Leukocytes, UA: NEGATIVE
Nitrite, UA: NEGATIVE
PH UA: 5.5 (ref 5.0–7.5)
Protein, UA: NEGATIVE
RBC UA: NEGATIVE
Specific Gravity, UA: 1.01 (ref 1.005–1.030)
Urobilinogen, Ur: 0.2 mg/dL (ref 0.2–1.0)

## 2018-05-27 LAB — MICROSCOPIC EXAMINATION
RBC, UA: NONE SEEN /hpf (ref 0–2)
RENAL EPITHEL UA: NONE SEEN /HPF

## 2018-05-27 NOTE — Telephone Encounter (Signed)
Patient aware and apt made with [facility] today at 355

## 2018-05-27 NOTE — Progress Notes (Signed)
BP 136/68   Pulse 78   Temp (!) 100.7 F (38.2 C) (Oral)   Ht 5' 0.25" (1.53 m)   Wt 125 lb (56.7 kg)   BMI 24.21 kg/m    Subjective:    Patient ID: Susan Fitzgerald, female    DOB: 08-19-1932, 84 y.o.   MRN: [id number]  HPI: Susan Fitzgerald is a 84 y.o. female presenting on 05/27/2018 for Follow up elevated creatnine (at rheumatologist yesterday); Bruising on left lower leg; and Recently noticed some "staining" after a bowel movement   HPI Patient saw a rheumatologist and they said she had elevated creatinine kinase and to come here this week to get rechecked.  She was seen there last week.  She is still making urine.  She does admit to using Lasix, recommended that she back off on the Lasix only take it every other day.  Anal bleeding Patient has had some nontender anal bleeding, it is only a pink tinge on the toilet paper that she is had every now and then.  She has had this and since she is on Eliquis and she was concerned that it could be bleeding from there.  She denies any lightheadedness or dizziness or pallor.  Bruising and skin tear on leg Patient has bruising and skin tear on left leg.  She has been using nonstick gauze on it and has changed it every 2 or 3 days.  She denies any redness or warmth or drainage from it.  She says she did it a few days ago when she was getting up and snagged her leg against her husband's reclined chair.  She says it bled some but not too much and she kept it covered since then.  Patient does complain of some low-grade fevers but does not have any chills or body aches.  She did not actually feel feverish but her temperature here in the office today is 100.7 and she is left a urine sample.  She denies any urinary or abdominal symptoms.  Relevant past medical, surgical, family and social history reviewed and updated as indicated. Interim medical history since our last visit reviewed. Allergies and medications reviewed and updated.  Review of Systems    Constitutional: Negative for chills and fever.  HENT: Negative for congestion, ear discharge and ear pain.   Eyes: Negative for redness and visual disturbance.  Respiratory: Negative for chest tightness and shortness of breath.   Cardiovascular: Negative for chest pain and leg swelling.  Gastrointestinal: Positive for anal bleeding. Negative for abdominal distention, abdominal pain, blood in stool, constipation and diarrhea.  Genitourinary: Negative for difficulty urinating, dysuria, flank pain, hematuria and urgency.  Musculoskeletal: Negative for back pain and gait problem.  Skin: Positive for wound. Negative for rash.  Neurological: Negative for light-headedness and headaches.  Psychiatric/Behavioral: Negative for agitation and behavioral problems.  All other systems reviewed and are negative.   Per HPI unless specifically indicated above   Allergies as of 05/27/2018      Reactions   Benazepril Hcl Cough      Medication List        Accurate as of 05/27/18  4:35 PM. Always use your most recent med list.          amiodarone 200 MG tablet Commonly known as:  PACERONE Take 200 mg by mouth daily.   calcium-vitamin D 250-125 MG-UNIT tablet Commonly known as:  OSCAL WITH D Take 1 tablet by mouth 2 (two) times daily.   diltiazem 120  MG 24 hr capsule Commonly known as:  CARDIZEM CD Take 1 capsule (120 mg total) by mouth daily.   ELIQUIS 2.5 MG Tabs tablet Generic drug:  apixaban TAKE 1 TABLET BY MOUTH 2  TIMES DAILY. PUT ON HOLD  UNTIL PATIENT REQUESTS.   folic acid 1 MG tablet Commonly known as:  FOLVITE Take 1 mg by mouth daily.   furosemide 20 MG tablet Commonly known as:  LASIX TAKE 1 TO 2 TABLETS BY  MOUTH DAILY AS NEEDED   gabapentin 100 MG capsule Commonly known as:  NEURONTIN Take 100 mg by mouth daily as needed.   levothyroxine 75 MCG tablet Commonly known as:  SYNTHROID, LEVOTHROID TAKE 1 TABLET BY MOUTH  DAILY   lisinopril 40 MG tablet Commonly  known as:  PRINIVIL,ZESTRIL TAKE 1 TABLET BY MOUTH  DAILY   Melatonin 5 MG Tabs Take 5 mg by mouth at bedtime.   methotrexate 2.5 MG tablet Commonly known as:  RHEUMATREX Take 10 mg by mouth once a week.   multivitamin capsule Take 1 capsule by mouth daily.   spironolactone 25 MG tablet Commonly known as:  ALDACTONE Take 1 tablet (25 mg total) by mouth daily.          Objective:    BP 136/68   Pulse 78   Temp (!) 100.7 F (38.2 C) (Oral)   Ht 5' 0.25" (1.53 m)   Wt 125 lb (56.7 kg)   BMI 24.21 kg/m   Wt Readings from Last 3 Encounters:  05/27/18 125 lb (56.7 kg)  05/01/18 126 lb (57.2 kg)  04/15/18 127 lb (57.6 kg)    Physical Exam  Constitutional: She is oriented to person, place, and time. She appears well-developed and well-nourished. No distress.  Eyes: Conjunctivae are normal.  Cardiovascular: Normal rate, regular rhythm, normal heart sounds and intact distal pulses.  No murmur heard. Pulmonary/Chest: Effort normal and breath sounds normal. No respiratory distress. She has no wheezes. She has no rales.  Abdominal: Soft. Bowel sounds are normal. She exhibits no distension. There is no tenderness. There is no guarding.  Musculoskeletal: Normal range of motion. She exhibits edema (Trace).  Neurological: She is alert and oriented to person, place, and time. Coordination normal.  Skin: Skin is warm and dry. Laceration (4 cm tall by 3 cm wide skin tear with good pink granulation tissue base, no signs of erythema or drainage or purulence.) noted. No rash noted. She is not diaphoretic.  Psychiatric: She has a normal mood and affect. Her behavior is normal.  Nursing note and vitals reviewed.       Assessment & Plan:   Problem List Items Addressed This Visit    None    Visit Diagnoses    Elevated creatine kinase    -  Primary   Relevant Orders   CBC with Differential/Platelet   Urinalysis, Complete   CMP14+EGFR   Microalbumin / creatinine urine ratio   CBC  with Differential/Platelet   Anal bleeding       Relevant Orders   Fecal occult blood, imunochemical   Noninfected skin tear of left lower extremity, initial encounter       Recommended topical antibiotic and Xeroform and covered with gauze and tape, change daily, return in 2 weeks      Recommended backing off on Lasix and only taking it every other day.   Follow up plan: Return in about 2 weeks (around 06/10/2018), or if symptoms worsen or fail to improve, for Skin tear/wound  recheck.  Counseling provided for all of the vaccine components Orders Placed This Encounter  Procedures  . Fecal occult blood, imunochemical  . CBC with Differential/Platelet  . Urinalysis, Complete  . CMP14+EGFR  . Microalbumin / creatinine urine ratio  . CBC with Differential/Platelet    Caryl Pina, MD [facility] 05/27/2018, 4:35 PM

## 2018-05-28 ENCOUNTER — Telehealth: Payer: Self-pay | Admitting: Family Medicine

## 2018-05-28 ENCOUNTER — Other Ambulatory Visit: Payer: Self-pay | Admitting: *Deleted

## 2018-05-28 DIAGNOSIS — R7989 Other specified abnormal findings of blood chemistry: Secondary | ICD-10-CM

## 2018-05-28 DIAGNOSIS — R748 Abnormal levels of other serum enzymes: Secondary | ICD-10-CM

## 2018-05-28 LAB — CMP14+EGFR
A/G RATIO: 1.4 (ref 1.2–2.2)
ALBUMIN: 3.3 g/dL — AB (ref 3.5–4.7)
ALK PHOS: 71 IU/L (ref 39–117)
ALT: 98 IU/L — ABNORMAL HIGH (ref 0–32)
AST: 72 IU/L — ABNORMAL HIGH (ref 0–40)
BILIRUBIN TOTAL: 0.3 mg/dL (ref 0.0–1.2)
BUN/Creatinine Ratio: 15 (ref 12–28)
BUN: 31 mg/dL — ABNORMAL HIGH (ref 8–27)
CHLORIDE: 102 mmol/L (ref 96–106)
CO2: 26 mmol/L (ref 20–29)
Calcium: 8.7 mg/dL (ref 8.7–10.3)
Creatinine, Ser: 2.03 mg/dL — ABNORMAL HIGH (ref 0.57–1.00)
GFR calc non Af Amer: 22 mL/min/{1.73_m2} — ABNORMAL LOW (ref >59)
GFR, EST AFRICAN AMERICAN: 25 mL/min/{1.73_m2} — AB (ref >59)
Globulin, Total: 2.4 g/dL (ref 1.5–4.5)
Glucose: 85 mg/dL (ref 65–99)
POTASSIUM: 4.8 mmol/L (ref 3.5–5.2)
Sodium: 138 mmol/L (ref 134–144)
TOTAL PROTEIN: 5.7 g/dL — AB (ref 6.0–8.5)

## 2018-05-28 LAB — CBC WITH DIFFERENTIAL/PLATELET
BASOS: 0 %
Basophils Absolute: 0 10*3/uL (ref 0.0–0.2)
EOS (ABSOLUTE): 0.1 10*3/uL (ref 0.0–0.4)
EOS: 2 %
HEMOGLOBIN: 11.4 g/dL (ref 11.1–15.9)
Hematocrit: 34 % (ref 34.0–46.6)
Immature Grans (Abs): 0 10*3/uL (ref 0.0–0.1)
Immature Granulocytes: 0 %
LYMPHS ABS: 1.1 10*3/uL (ref 0.7–3.1)
Lymphs: 21 %
MCH: 33.8 pg — AB (ref 26.6–33.0)
MCHC: 33.5 g/dL (ref 31.5–35.7)
MCV: 101 fL — AB (ref 79–97)
MONOCYTES: 11 %
Monocytes Absolute: 0.6 10*3/uL (ref 0.1–0.9)
NEUTROS ABS: 3.4 10*3/uL (ref 1.4–7.0)
Neutrophils: 66 %
PLATELETS: 154 10*3/uL (ref 150–450)
RBC: 3.37 x10E6/uL — ABNORMAL LOW (ref 3.77–5.28)
RDW: 15.5 % — ABNORMAL HIGH (ref 12.3–15.4)
WBC: 5.2 10*3/uL (ref 3.4–10.8)

## 2018-05-28 LAB — MICROALBUMIN / CREATININE URINE RATIO
CREATININE, UR: 40.4 mg/dL
Microalb/Creat Ratio: 10.9 mg/g creat (ref 0.0–30.0)
Microalbumin, Urine: 4.4 ug/mL

## 2018-05-28 LAB — FECAL OCCULT BLOOD, IMMUNOCHEMICAL: FECAL OCCULT BLD: POSITIVE — AB

## 2018-05-29 ENCOUNTER — Other Ambulatory Visit: Payer: Self-pay | Admitting: *Deleted

## 2018-05-29 DIAGNOSIS — K921 Melena: Secondary | ICD-10-CM

## 2018-05-29 IMAGING — MR MR LUMBAR SPINE W/O CM
4 of 5 series · 24 of 48 positions shown · non-contrast
Comparison: [HOSPITAL] neurosurgery lumbar radiographs 07/23/2016.

CLINICAL DATA: 84-year-old female with lumbar radiculopathy. Pain
radiating to the right hip and leg for several months with no known
injury. Initial encounter.

EXAM:
MRI LUMBAR SPINE WITHOUT CONTRAST
TECHNIQUE: Multiplanar, multisequence MR imaging of the lumbar spine was
performed. No intravenous contrast was administered.

[Series 3: T2 · sagittal · 4.0mm · 0.47mm/px · 6 of 14 slices shown (1 of 2)]
[im 1/14]
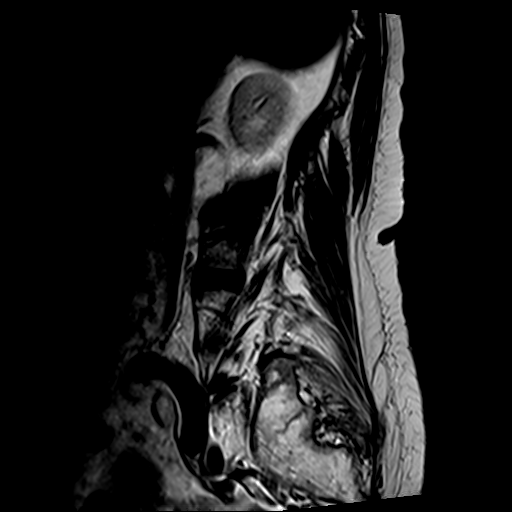
[im 3/14]
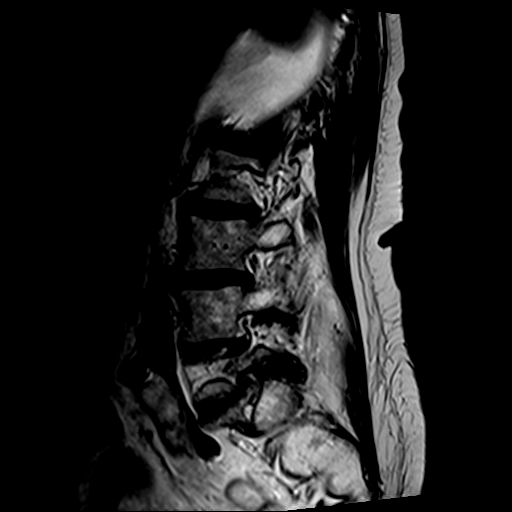
[im 6/14]
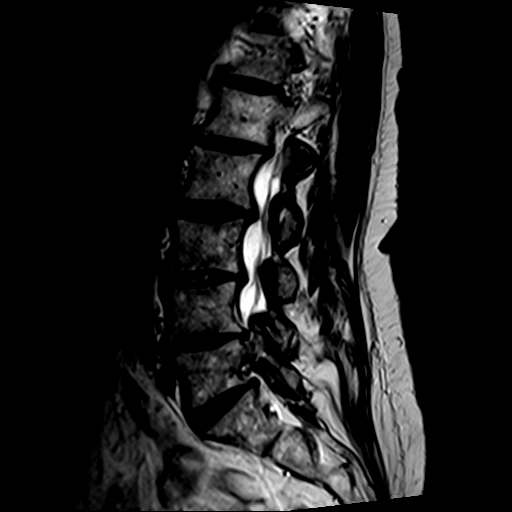
[im 8/14]
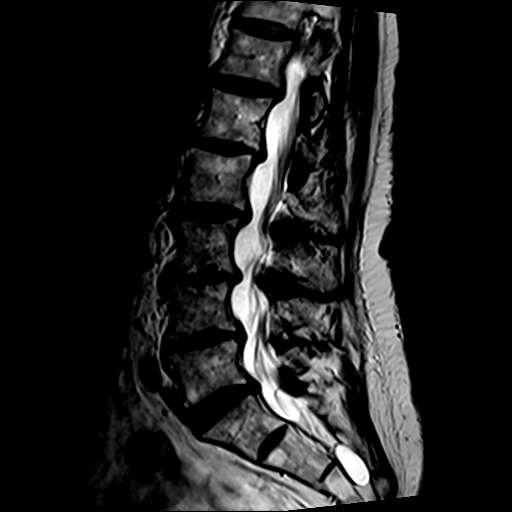
[im 11/14]
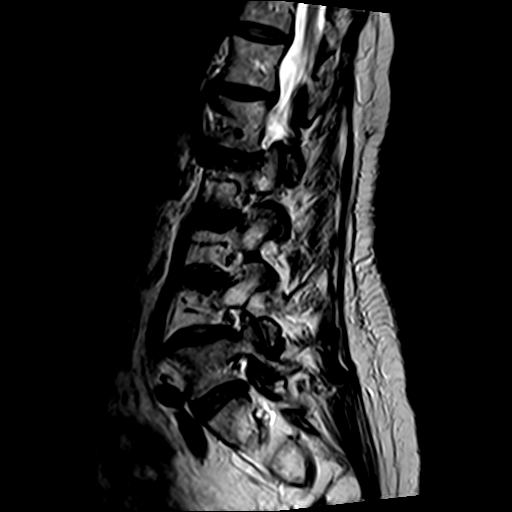
[im 14/14]
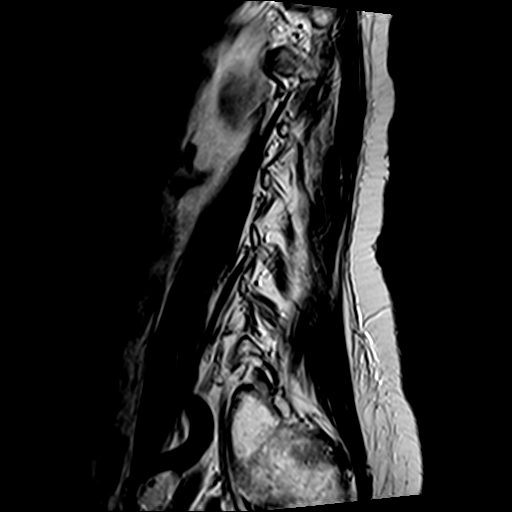

[Series 4: T1 · sagittal · 4.0mm · 0.47mm/px · 7 of 14 slices shown (1 of 2)]
[im 1/14]
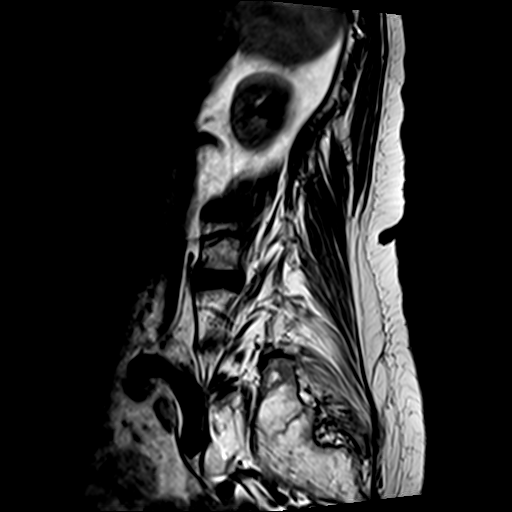
[im 3/14]
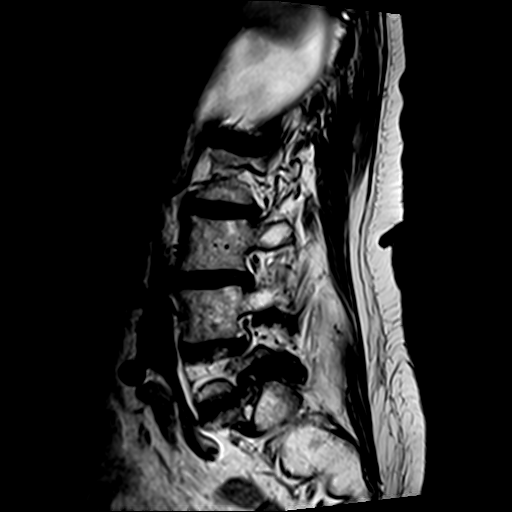
[im 5/14]
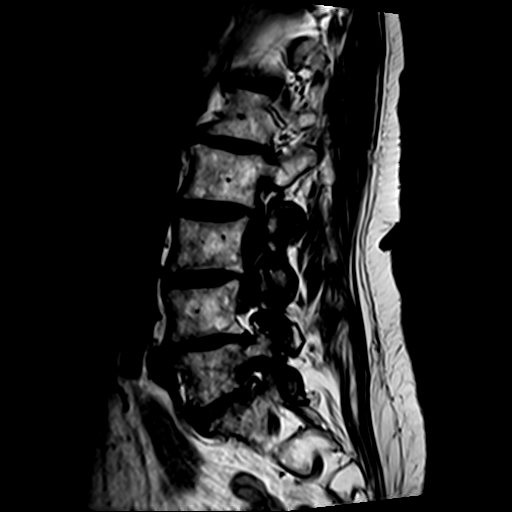
[im 7/14]
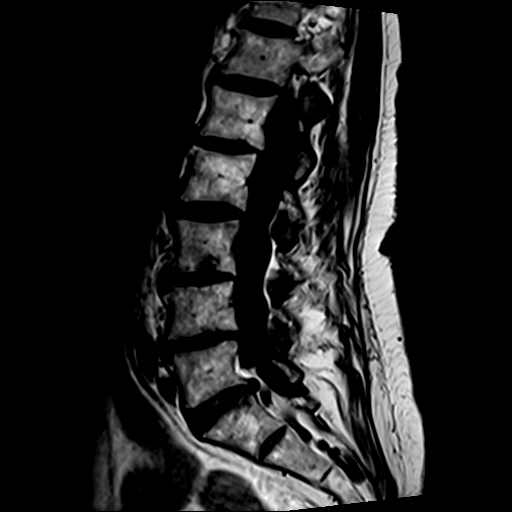
[im 9/14]
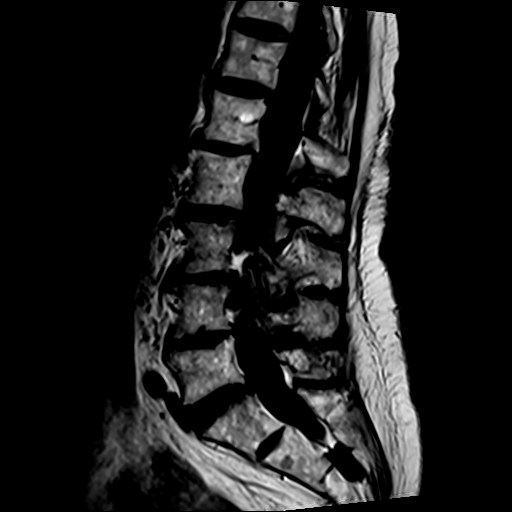
[im 11/14]
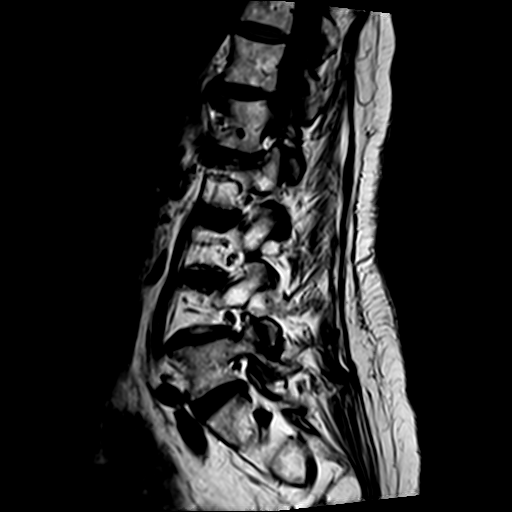
[im 14/14]
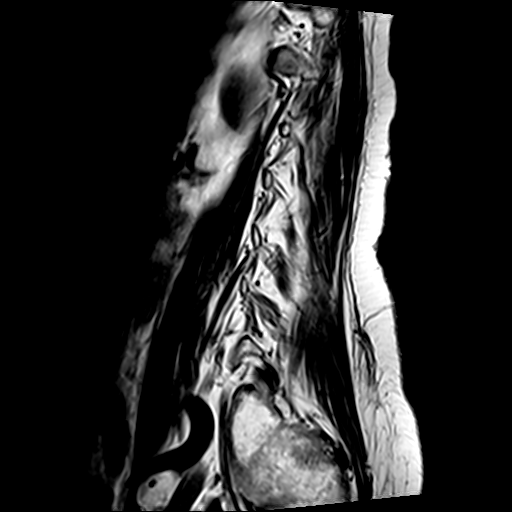

[Series 6: T1 · axial · 4.0mm · 0.35mm/px · z∈[-0,+136]mm · 3 of 30 slices shown (2 of 2)]
[im 5/30]
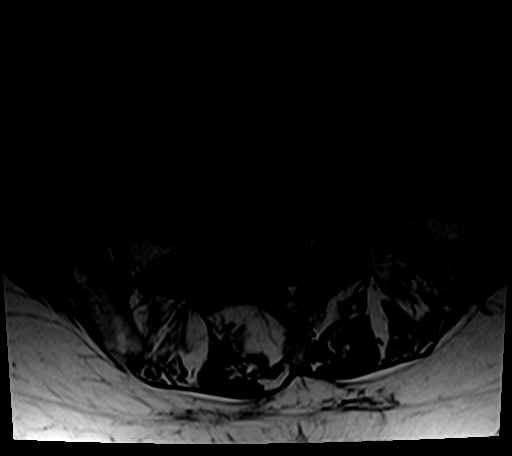
[im 16/30]
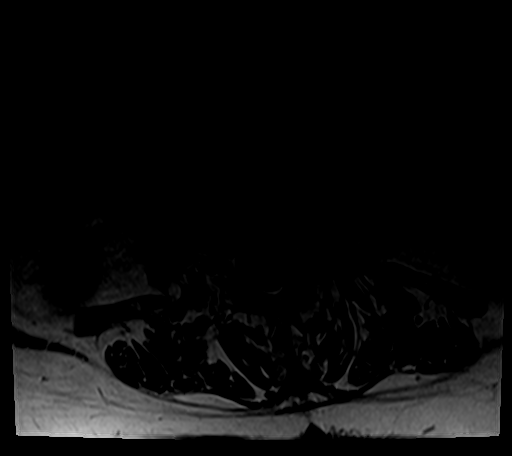
[im 25/30]
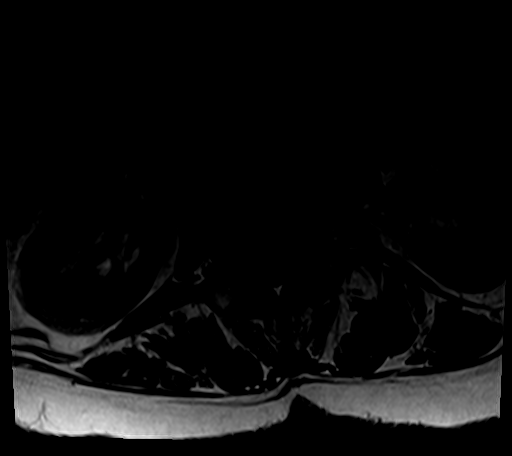

[Series 7: T2 · axial · 4.0mm · 0.70mm/px · z∈[-20,+202]mm · 8 of 30 slices shown (2 of 2)]
[im 1/30]
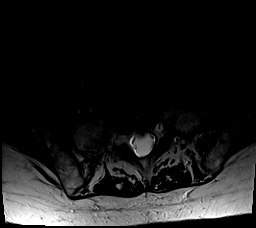
[im 5/30]
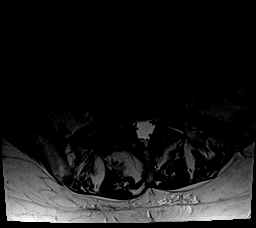
[im 9/30]
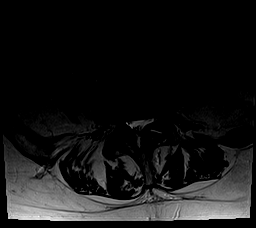
[im 14/30]
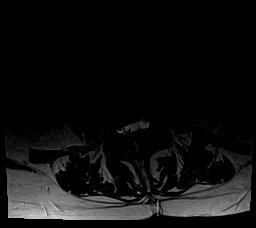
[im 16/30]
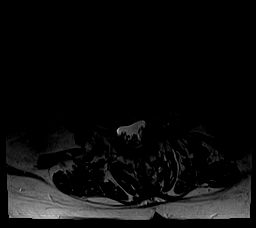
[im 21/30]
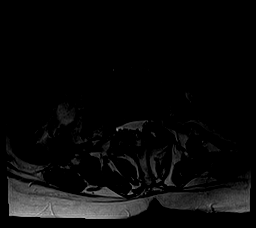
[im 25/30]
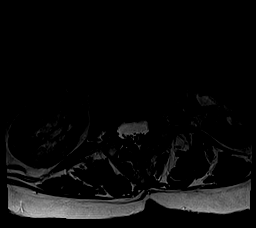
[im 30/30]
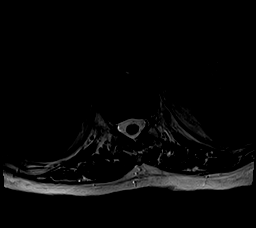

[24 of 48 positions shown; findings below may reference images not displayed]

FINDINGS: Segmentation:  Normal as demonstrated on the comparison radiographs.

Alignment: Mild to moderate dextro convex lumbar scoliosis again
noted. Otherwise relatively preserved lumbar lordosis.

Vertebrae: No marrow edema or evidence of acute osseous abnormality.

Conus medullaris: Extends to the L1-L2 level and appears normal.

Paraspinal and other soft tissues: Negative.

Disc levels:

T11-T12:  Negative.

T12-L1: Mild disc bulge. Mild facet and ligament flavum hypertrophy.
No stenosis.

L1-L2: Moderate to severe disc space loss. Left eccentric disc
bulge. Mild ligament flavum hypertrophy. Mild left L1 foraminal
stenosis.

L2-L3: Mild to moderate disc space loss. Circumferential disc bulge.
Moderate facet and ligament flavum hypertrophy. Mild spinal and up
to mild bilateral lateral recess stenosis. Mild left L2 foraminal
stenosis.

L3-L4: Severe disc space loss. Circumferential disc bulge eccentric
to the left. Left greater than right far lateral endplate spurring.
Moderate facet and moderate to severe ligament flavum hypertrophy.
Mild spinal stenosis. No convincing lateral recess or foraminal
stenosis.

L4-L5: Moderate to severe disc space loss with circumferential disc
bulge. Far lateral endplate spurring. Moderate to severe facet
hypertrophy greater on the right. Moderate ligament flavum
hypertrophy. Trace facet joint fluid on the left. Mild spinal
stenosis. No convincing lateral recess stenosis. Moderate right L4
foraminal stenosis.

L5-S1: Bulky mostly right far lateral disc bulge endplate spurring.
Mild broad-based posterior component of disc. Moderate to severe
facet hypertrophy greater on the right. Trace facet joint fluid. No
spinal or convincing lateral recess stenosis. Severe right L5
foraminal stenosis.
IMPRESSION: 1. Dextro convex lumbar scoliosis with widespread chronic lumbar
disc, endplate, and facet degeneration. No acute osseous
abnormality.
2. There is moderate to severe multifactorial right neural foraminal
stenosis at L4-L5 and L5-S1.
3. There is up to mild multifactorial spinal stenosis at L2-L3,
L3-L4, and L4-L5.
4. There is no convincing lateral recess stenosis except for mild
bilateral lateral recess stenosis at L2-L3.

## 2018-06-01 ENCOUNTER — Telehealth: Payer: Self-pay | Admitting: Family Medicine

## 2018-06-01 ENCOUNTER — Telehealth: Payer: Self-pay | Admitting: Cardiology

## 2018-06-01 DIAGNOSIS — N184 Chronic kidney disease, stage 4 (severe): Secondary | ICD-10-CM

## 2018-06-01 DIAGNOSIS — I10 Essential (primary) hypertension: Secondary | ICD-10-CM | POA: Diagnosis not present

## 2018-06-01 DIAGNOSIS — S22080G Wedge compression fracture of T11-T12 vertebra, subsequent encounter for fracture with delayed healing: Secondary | ICD-10-CM | POA: Diagnosis not present

## 2018-06-01 NOTE — Telephone Encounter (Signed)
Left message- referral placed.  

## 2018-06-01 NOTE — Telephone Encounter (Signed)
Patient came in as a walk in. She has been made aware to hold eliquis for now. She will call on Wednesday when she has seen her GI doctor.

## 2018-06-01 NOTE — Telephone Encounter (Signed)
Returned the call to the patient. She stated that for the last two weeks she has been having blood smears when she uses the restroom. She had a fecal occult test done which was positive. As of yesterday the blood has increased. She has an appointment with a GI doctor on Wednesday.   She would like to know if she can hold the Eliquis 2.5 mg for now. She did not take it this morning.

## 2018-06-01 NOTE — Telephone Encounter (Signed)
Refer to nephron placed.  Covering for PCP  Pt with GFR consistent with CKD [place], MD [facility] 06/01/2018, 9:57 AM

## 2018-06-01 NOTE — Telephone Encounter (Signed)
CHADS2-VASc score of  4 (HTN, AGEx2, female)  Okay to hold until f/u with GI doctor

## 2018-06-01 NOTE — Telephone Encounter (Signed)
OK to hold

## 2018-06-01 NOTE — Telephone Encounter (Signed)
Left a message to call back.

## 2018-06-01 NOTE — Telephone Encounter (Signed)
PT states that she is needing a referral to kidney specialist said that it show in her records why

## 2018-06-01 NOTE — Telephone Encounter (Signed)
Pt calling   Pt is passing blood and has an appt to see GI doctor. Pt want to know if she need to continue to take her Eliquis. Please call pt.

## 2018-06-02 ENCOUNTER — Telehealth: Payer: Self-pay

## 2018-06-02 NOTE — Telephone Encounter (Signed)
Patient wants to talk to Roselyn Reef about her Nephrology referral   (I have told her it takes a while for Nephrology especially the one [facility] wanted her to go to in [place] because they rate the patients a 1,2,3)  She is very worried about her kidneys

## 2018-06-02 NOTE — Addendum Note (Signed)
Addended by: Zannie Cove on: 06/02/2018 04:09 PM   Modules accepted: Orders

## 2018-06-02 NOTE — Telephone Encounter (Signed)
Pt is concerned right now because she has had to hold several meds recently because of the elevated creatinine  Including :  ELIQUIS by cardio and METHOTREXATE By rheumatology. She is worried as to what other risks she may encounter while waiting for the neph appt.   Can you call the neph group and let them know this?? This may triage her faster.Marland Kitchen

## 2018-06-03 ENCOUNTER — Encounter: Payer: Self-pay | Admitting: Internal Medicine

## 2018-06-03 ENCOUNTER — Ambulatory Visit: Payer: Medicare Other | Admitting: Internal Medicine

## 2018-06-03 VITALS — BP 122/72 | HR 61 | Ht 60.0 in | Wt 123.0 lb

## 2018-06-03 DIAGNOSIS — K649 Unspecified hemorrhoids: Secondary | ICD-10-CM | POA: Diagnosis not present

## 2018-06-03 DIAGNOSIS — Z800 Family history of malignant neoplasm of digestive organs: Secondary | ICD-10-CM | POA: Diagnosis not present

## 2018-06-03 DIAGNOSIS — R195 Other fecal abnormalities: Secondary | ICD-10-CM

## 2018-06-03 DIAGNOSIS — K625 Hemorrhage of anus and rectum: Secondary | ICD-10-CM | POA: Diagnosis not present

## 2018-06-03 MED ORDER — NA SULFATE-K SULFATE-MG SULF 17.5-3.13-1.6 GM/177ML PO SOLN
1.0000 | Freq: Once | ORAL | 0 refills | Status: AC
Start: 2018-06-03 — End: 2018-06-03

## 2018-06-03 NOTE — Patient Instructions (Signed)

## 2018-06-03 NOTE — Progress Notes (Addendum)
HISTORY OF PRESENT ILLNESS:  Susan Fitzgerald is a 84 y.o. female with multiple medical problems as listed below including a prior history of atrial fibrillation for which she had been on Eliquis. She is sent today by her primary care provider Dr. Laurance Flatten regarding rectal bleeding and Hemoccult-positive stool. Patient has a family history of colon cancer in her brother diagnosed in his 28s. She had been N/A screening/surveillance colonoscopy program with Dr. Teena Irani of [facility]. Says that her last colonoscopy was a few years ago and that she aged out of screening after discussing this with Dr. Amedeo Plenty. She was well until several weeks ago when she began to notice staining of blood on the toilet tissue for several weeks. More recently she had significant blood in the toilet water. Her bowel habits typically are regular. Loose stools over the past 5 days. She was evaluated by her primary care facility 05/27/2018. I have reviewed that encounter. No rectal exam. Hemoccult card submitted and returned positive. Laboratories that day remarkable for creatinine 2.03 for which her methotrexate was discontinued. Her hemoglobin was 11.4 which compared favorably to her hemoglobin of 11.83 months prior. She was having some right upper quadrant discomfort which has resolved. This was around June 2018 when she underwent abdominal ultrasound (reviewed) which was negative. Her weight has been stable. She feels like she has protruding hemorrhoids for which she has been using Preparation H. Slightly decreased appetite. Records from outside gastroenterology facility have been requested after the patient signed the appropriate release form. Her Eliquis was stopped several days ago. Other GI complaints include belching, gas, loose stools as mentioned.  REVIEW OF SYSTEMS:  All non-GI ROS negative except for arthritis, back pain, sleeping problems, ankle swelling  Past Medical History:  Diagnosis Date  . Arthritis   .  Bradycardia 2014  . Cataract   . Diverticulosis   . Dyspnea    periodically  . Dysrhythmia    a - fib  . Heart murmur    ??? bruit  . History of stress test    a. 04/09/13-nuclear stress-no ischemia low risk  . Hypertension   . Menopause   . Osteoporosis   . PAF (paroxysmal atrial fibrillation) ([place])    a. s/p prior DCCV; b. On flecainide & coumadin (CHA2DS2VASc = 4);  c. 03/2016 Echo: EF 55-60%, mod LVH, mod AI, mild to mod TR, PASP 82mmHg.  Marland Kitchen Pelvic fracture [facility])     Past Surgical History:  Procedure Laterality Date  . BIOPSY BREAST    . BIOPSY BREAST     right & benign  . BREAST SURGERY    . CARDIOVERSION N/A 10/30/2016   Procedure: CARDIOVERSION;  Surgeon: Larey Dresser, MD;  Location: [facility];  Service: Cardiovascular;  Laterality: N/A;  . CARDIOVERSION N/A 08/20/2017   Procedure: CARDIOVERSION;  Surgeon: Jerline Pain, MD;  Location: [facility] ENDOSCOPY;  Service: Cardiovascular;  Laterality: N/A;  . CATARACT EXTRACTION    . EYE SURGERY     cataracts  . TEE WITHOUT CARDIOVERSION N/A 10/30/2016   Procedure: TRANSESOPHAGEAL ECHOCARDIOGRAM (TEE);  Surgeon: Larey Dresser, MD;  Location: [facility];  Service: Cardiovascular;  Laterality: N/A;  . TOTAL HIP ARTHROPLASTY Right 01/27/2017   Procedure: TOTAL HIP ARTHROPLASTY ANTERIOR APPROACH;  Surgeon: Rod Can, MD;  Location: [facility];  Service: Orthopedics;  Laterality: Right;    Social History Susan Fitzgerald  reports that she has never smoked. She has never used smokeless tobacco. She reports that she does  not drink alcohol or use drugs.  family history includes Cancer in her brother, daughter, and maternal aunt; Heart attack in her brother and father; Heart disease in her brother, brother, and father; Stroke in her mother; Vision loss in her father.  Allergies  Allergen Reactions  . Benazepril Hcl Cough       PHYSICAL EXAMINATION: Vital signs: BP 122/72   Pulse 61   Ht 5' (1.524 m)   Wt 123 lb (55.8 kg)    SpO2 96%   BMI 24.02 kg/m   Constitutional: leasant,generally well-appearing, no acute distress Psychiatric: alert and oriented x3, cooperative Eyes: extraocular movements intact, anicteric, conjunctiva pink Mouth: oral pharynx moist, no lesions Neck: supple no lymphadenopathy Cardiovascular: heart regular rate and rhythm, no murmur Lungs: clear to auscultation bilaterally Abdomen: soft, nontender, nondistended, no obvious ascites, no peritoneal signs, normal bowel sounds, no organomegaly Rectal:deferred until colonoscopy Extremities: no clubbing, cyanosis, orlower extremity edema bilaterally Skin: no lesions on visible extremities Neuro: No focal deficits. Cranial nerves intact  ASSESSMENT:  #1. Minor rectal bleeding. Hemoccult-positive stool. Rule out hemorrhoids. Rule out neoplasia. Had been on Eliquis. Now on hold per PCP. Stable hemoglobin #2. Family history of colon cancer. Patient reports multiple colonoscopies elsewhere. Records have been requested #3. Multiple medical problems including history of atrial fibrillation (has been in normal sinus rhythm for some time) for which she was on Eliquis #4. Probable hemorrhoids, possibly prolapsing   PLAN:  #1. Colonoscopy to evaluate bleeding and Hemoccult-positive stool.The nature of the procedure, as well as the risks, benefits, and alternatives were carefully and thoroughly reviewed with the patient. Ample time for discussion and questions allowed. The patient understood, was satisfied, and agreed to proceed.. 2. Based on findings further recommendations will be provided  A copy of this consultation note has been sent to Dr. Laurance Flatten  ADDENDUM 07/02/2018: Outside colonoscopy report from Dr. Amedeo Plenty received. Colonoscopy performed 10/09/2010 revealed diverticulosis only.

## 2018-06-05 NOTE — Telephone Encounter (Signed)
Spoke with pt about kidney Dr a few days back. I assume she just needs xray info sent??

## 2018-06-05 NOTE — Telephone Encounter (Signed)
Spoke with pt and advised we have called [facility] and left messages to see if she has been scheduled yet. We are just waiting on them to schedule. Pt given phone number for [facility] to contact them [phone].

## 2018-06-05 NOTE — Telephone Encounter (Signed)
Pt has been waiting for referral to see kidney specialist because creatine is high. Pt asked to talk to [place]. Please call  back

## 2018-06-08 ENCOUNTER — Ambulatory Visit (INDEPENDENT_AMBULATORY_CARE_PROVIDER_SITE_OTHER): Payer: Medicare Other | Admitting: Physician Assistant

## 2018-06-08 ENCOUNTER — Other Ambulatory Visit: Payer: Self-pay | Admitting: Neurosurgery

## 2018-06-08 ENCOUNTER — Other Ambulatory Visit: Payer: Medicare Other

## 2018-06-08 ENCOUNTER — Other Ambulatory Visit: Payer: Self-pay | Admitting: *Deleted

## 2018-06-08 ENCOUNTER — Encounter: Payer: Self-pay | Admitting: Physician Assistant

## 2018-06-08 VITALS — BP 126/62 | HR 73 | Temp 101.3°F | Ht 60.0 in | Wt 124.0 lb

## 2018-06-08 DIAGNOSIS — R410 Disorientation, unspecified: Secondary | ICD-10-CM | POA: Insufficient documentation

## 2018-06-08 DIAGNOSIS — N184 Chronic kidney disease, stage 4 (severe): Secondary | ICD-10-CM

## 2018-06-08 DIAGNOSIS — R5383 Other fatigue: Secondary | ICD-10-CM | POA: Diagnosis not present

## 2018-06-08 DIAGNOSIS — E039 Hypothyroidism, unspecified: Secondary | ICD-10-CM

## 2018-06-08 DIAGNOSIS — R531 Weakness: Secondary | ICD-10-CM | POA: Diagnosis not present

## 2018-06-08 DIAGNOSIS — R509 Fever, unspecified: Secondary | ICD-10-CM

## 2018-06-08 DIAGNOSIS — R251 Tremor, unspecified: Secondary | ICD-10-CM | POA: Diagnosis not present

## 2018-06-08 DIAGNOSIS — R6889 Other general symptoms and signs: Secondary | ICD-10-CM | POA: Diagnosis not present

## 2018-06-08 DIAGNOSIS — S22080G Wedge compression fracture of T11-T12 vertebra, subsequent encounter for fracture with delayed healing: Secondary | ICD-10-CM

## 2018-06-08 LAB — URINALYSIS, ROUTINE W REFLEX MICROSCOPIC
BILIRUBIN UA: NEGATIVE
Glucose, UA: NEGATIVE
KETONES UA: NEGATIVE
LEUKOCYTES UA: NEGATIVE
Nitrite, UA: NEGATIVE
Protein, UA: NEGATIVE
RBC UA: NEGATIVE
Urobilinogen, Ur: 0.2 mg/dL (ref 0.2–1.0)
pH, UA: 5.5 (ref 5.0–7.5)

## 2018-06-08 LAB — MICROSCOPIC EXAMINATION: Bacteria, UA: NONE SEEN

## 2018-06-08 NOTE — Patient Instructions (Signed)
In a few days you may receive a survey in the mail or online from [employer] regarding your visit with us today. Please take a moment to fill this out. Your feedback is very important to our whole office. It can help us better understand your needs as well as improve your experience and satisfaction. Thank you for taking your time to complete it. We care about you.  Filippa Yarbough, PA-C  

## 2018-06-09 LAB — CBC WITH DIFFERENTIAL/PLATELET
BASOS ABS: 0 10*3/uL (ref 0.0–0.2)
Basos: 0 %
EOS (ABSOLUTE): 0.1 10*3/uL (ref 0.0–0.4)
Eos: 1 %
Hematocrit: 31.1 % — ABNORMAL LOW (ref 34.0–46.6)
Hemoglobin: 10.3 g/dL — ABNORMAL LOW (ref 11.1–15.9)
Immature Grans (Abs): 0 10*3/uL (ref 0.0–0.1)
Immature Granulocytes: 0 %
LYMPHS ABS: 0.7 10*3/uL (ref 0.7–3.1)
Lymphs: 9 %
MCH: 33 pg (ref 26.6–33.0)
MCHC: 33.1 g/dL (ref 31.5–35.7)
MCV: 100 fL — ABNORMAL HIGH (ref 79–97)
Monocytes Absolute: 1.5 10*3/uL — ABNORMAL HIGH (ref 0.1–0.9)
Monocytes: 19 %
NEUTROS ABS: 5.6 10*3/uL (ref 1.4–7.0)
Neutrophils: 71 %
Platelets: 253 10*3/uL (ref 150–450)
RBC: 3.12 x10E6/uL — AB (ref 3.77–5.28)
RDW: 14.9 % (ref 12.3–15.4)
WBC: 8 10*3/uL (ref 3.4–10.8)

## 2018-06-09 LAB — BASIC METABOLIC PANEL
BUN / CREAT RATIO: 9 — AB (ref 12–28)
BUN: 13 mg/dL (ref 8–27)
CALCIUM: 7.8 mg/dL — AB (ref 8.7–10.3)
CO2: 22 mmol/L (ref 20–29)
CREATININE: 1.5 mg/dL — AB (ref 0.57–1.00)
Chloride: 100 mmol/L (ref 96–106)
GFR calc Af Amer: 37 mL/min/{1.73_m2} — ABNORMAL LOW (ref >59)
GFR, EST NON AFRICAN AMERICAN: 32 mL/min/{1.73_m2} — AB (ref >59)
GLUCOSE: 92 mg/dL (ref 65–99)
Potassium: 3.7 mmol/L (ref 3.5–5.2)
Sodium: 136 mmol/L (ref 134–144)

## 2018-06-09 LAB — THYROID PANEL WITH TSH
FREE THYROXINE INDEX: 4.2 (ref 1.2–4.9)
T3 UPTAKE RATIO: 37 % (ref 24–39)
T4 TOTAL: 11.3 ug/dL (ref 4.5–12.0)
TSH: 0.689 u[IU]/mL (ref 0.450–4.500)

## 2018-06-09 LAB — URINE CULTURE

## 2018-06-09 LAB — VITAMIN B12: Vitamin B-12: 1691 pg/mL — ABNORMAL HIGH (ref 232–1245)

## 2018-06-09 NOTE — Progress Notes (Signed)
BP 126/62   Pulse 73   Temp (!) 101.3 F (38.5 C) (Oral)   Ht 5' (1.524 m)   Wt 124 lb (56.2 kg)   BMI 24.22 kg/m    Subjective:    Patient ID: Susan Fitzgerald, female    DOB: 1932/10/06, 84 y.o.   MRN: [id number]  HPI: Susan Fitzgerald is a 84 y.o. female presenting on 06/08/2018 for Fatigue; Tremors; Fever; and Diarrhea  This patient comes in with a long complaints.  They have gone on for several weeks now.  Earlier in June the patient was seen and was having some fever and she has consistently had fever off and on since then.  Even today's fever was 101.3.  She states that she has developed a tremor in her hands, the left greater than the right.  It is very fine but she does notice it, changes in her handwriting are also present.  She also states that she is losing focus.  Recently she was golfing which she normally does as a very avid player, and when she was playing and she had a hard time thinking and having her brain connect to do that activity.  She also has had a decrease in her appetite.  She is currently seeing gastroenterology and does have a colonoscopy scheduled for later this month.  She will also be seeing nephrology for some changes she has had in her renal functions.  She is just greatly is concerned if there is any other situation that is going on with her body.  She states she occasionally has some diarrhea and loose stools.  At this point her stools are more of a loose consistency.  She has not seen any blood.  She did have a sensation of her Eliquis, methotrexate, Aldactone.  We will draw labs today and see if there is any new findings.  She does not remember any other injections, bites, tick bites.  Past Medical History:  Diagnosis Date  . Arthritis   . Bradycardia 2014  . Cataract   . Diverticulosis   . Dyspnea    periodically  . Dysrhythmia    a - fib  . Heart murmur    ??? bruit  . History of stress test    a. 04/09/13-nuclear stress-no ischemia low risk  .  Hypertension   . Menopause   . Osteoporosis   . PAF (paroxysmal atrial fibrillation) ([place])    a. s/p prior DCCV; b. On flecainide & coumadin (CHA2DS2VASc = 4);  c. 03/2016 Echo: EF 55-60%, mod LVH, mod AI, mild to mod TR, PASP 63mmHg.  Marland Kitchen Pelvic fracture ([facility])    Relevant past medical, surgical, family and social history reviewed and updated as indicated. Interim medical history since our last visit reviewed. Allergies and medications reviewed and updated. DATA REVIEWED: CHART IN EPIC  Family History reviewed for pertinent findings.  Review of Systems  Constitutional: Positive for appetite change, chills, fatigue, fever and unexpected weight change. Negative for activity change.  HENT: Negative.   Eyes: Negative.   Respiratory: Negative.  Negative for cough and wheezing.   Cardiovascular: Negative.  Negative for chest pain and palpitations.  Gastrointestinal: Negative.  Negative for abdominal pain.  Endocrine: Negative.   Genitourinary: Negative.  Negative for dysuria.  Musculoskeletal: Positive for arthralgias, back pain and gait problem.  Skin: Negative.   Neurological: Positive for tremors, weakness and numbness. Negative for dizziness and light-headedness.  Psychiatric/Behavioral: Negative for agitation, self-injury, sleep disturbance and suicidal ideas.  The patient is not nervous/anxious.     Allergies as of 06/08/2018      Reactions   Benazepril Hcl Cough      Medication List        Accurate as of 06/08/18 11:59 PM. Always use your most recent med list.          amiodarone 200 MG tablet Commonly known as:  PACERONE Take 200 mg by mouth daily.   calcium-vitamin D 250-125 MG-UNIT tablet Commonly known as:  OSCAL WITH D Take 1 tablet by mouth 2 (two) times daily.   diltiazem 120 MG 24 hr capsule Commonly known as:  CARDIZEM CD Take 1 capsule (120 mg total) by mouth daily.   ELIQUIS 2.5 MG Tabs tablet Generic drug:  apixaban Take 2.5 mg by mouth 2 (two) times  daily. As directed   Currently on hold   folic acid 1 MG tablet Commonly known as:  FOLVITE Take 1 mg by mouth daily.   furosemide 20 MG tablet Commonly known as:  LASIX TAKE 1 TO 2 TABLETS BY  MOUTH DAILY AS NEEDED   gabapentin 100 MG capsule Commonly known as:  NEURONTIN Take 100 mg by mouth daily as needed.   levothyroxine 75 MCG tablet Commonly known as:  SYNTHROID, LEVOTHROID TAKE 1 TABLET BY MOUTH  DAILY   lisinopril 40 MG tablet Commonly known as:  PRINIVIL,ZESTRIL TAKE 1 TABLET BY MOUTH  DAILY   Melatonin 5 MG Tabs Take 5 mg by mouth at bedtime.   methotrexate 10 MG tablet Commonly known as:  RHEUMATREX Take 10 mg by mouth once a week. Caution: Chemotherapy. Protect from light.   multivitamin capsule Take 1 capsule by mouth daily.   spironolactone 25 MG tablet Commonly known as:  ALDACTONE Take 1 tablet (25 mg total) by mouth daily.          Objective:    BP 126/62   Pulse 73   Temp (!) 101.3 F (38.5 C) (Oral)   Ht 5' (1.524 m)   Wt 124 lb (56.2 kg)   BMI 24.22 kg/m   Allergies  Allergen Reactions  . Benazepril Hcl Cough    Wt Readings from Last 3 Encounters:  06/08/18 124 lb (56.2 kg)  06/03/18 123 lb (55.8 kg)  05/27/18 125 lb (56.7 kg)    Physical Exam  Constitutional: She is oriented to person, place, and time. She appears well-developed and well-nourished. No distress.  HENT:  Head: Normocephalic and atraumatic.  Right Ear: Tympanic membrane, external ear and ear canal normal.  Left Ear: Tympanic membrane, external ear and ear canal normal.  Nose: Nose normal. No rhinorrhea.  Mouth/Throat: Oropharynx is clear and moist and mucous membranes are normal. No oropharyngeal exudate or posterior oropharyngeal erythema.  Eyes: Pupils are equal, round, and reactive to light. Conjunctivae and EOM are normal.  Neck: Normal range of motion. Neck supple.  Cardiovascular: Normal rate, regular rhythm, normal heart sounds and intact distal pulses.    Pulmonary/Chest: Effort normal and breath sounds normal.  Abdominal: Soft. Bowel sounds are normal.  Neurological: She is alert and oriented to person, place, and time. She has normal reflexes. No cranial nerve deficit or sensory deficit. Coordination normal.  Skin: Skin is warm and dry. No rash noted.  Psychiatric: She has a normal mood and affect. Her behavior is normal. Judgment and thought content normal.    Results for orders placed or performed in visit on 06/08/18  Rocky mtn spotted fvr abs pnl(IgG+IgM)  Result  Value Ref Range   RMSF IgG WILL FOLLOW    RMSF IgM WILL FOLLOW   Lyme Ab/Western Blot Reflex  Result Value Ref Range   Lyme IgG/IgM Ab WILL FOLLOW    LYME DISEASE AB, QUANT, IGM WILL FOLLOW   Hepatic function panel  Result Value Ref Range   Total Protein 5.5 (L) 6.0 - 8.5 g/dL   Albumin 3.0 (L) 3.5 - 4.7 g/dL   Bilirubin Total 0.4 0.0 - 1.2 mg/dL   Bilirubin, Direct 0.17 0.00 - 0.40 mg/dL   Alkaline Phosphatase 63 39 - 117 IU/L   AST 78 (H) 0 - 40 IU/L   ALT 80 (H) 0 - 32 IU/L      Assessment & Plan:   1. Tremor of both hands - Ambulatory referral to Neurology - Hepatic function panel  2. Confusion - Ambulatory referral to Neurology  3. New onset of confusion  - Ambulatory referral to Neurology  4. Weakness - Rocky mtn spotted fvr abs pnl(IgG+IgM) - Lyme Ab/Western Blot Reflex - Ambulatory referral to Neurology  5. Fever, unspecified fever cause - Rocky mtn spotted fvr abs pnl(IgG+IgM) - Lyme Ab/Western Blot Reflex   Continue all other maintenance medications as listed above.  Follow up plan: Keep August appointment with Dr. Laurance Flatten  Educational handout given for [place] PA-C [place] [address]  [place], [place] [address] [phone]   06/09/2018, 9:13 PM

## 2018-06-10 ENCOUNTER — Telehealth: Payer: Self-pay | Admitting: Neurology

## 2018-06-10 ENCOUNTER — Encounter: Payer: Self-pay | Admitting: Neurology

## 2018-06-10 ENCOUNTER — Ambulatory Visit: Payer: Medicare Other | Admitting: Neurology

## 2018-06-10 VITALS — BP 135/68 | HR 86 | Temp 100.8°F | Ht 60.0 in | Wt 126.0 lb

## 2018-06-10 DIAGNOSIS — R251 Tremor, unspecified: Secondary | ICD-10-CM

## 2018-06-10 DIAGNOSIS — R2689 Other abnormalities of gait and mobility: Secondary | ICD-10-CM

## 2018-06-10 NOTE — Progress Notes (Signed)
Subjective:    Patient ID: Susan Fitzgerald is a 84 y.o. female.  HPI     Star Age, MD, PhD [facility] [address], [address].O. [place], [place] [address]  Dear Glenard Haring,   I saw your patient, Susan Fitzgerald, upon your kind request, in my neurologic clinic today for initial consultation of her tremors. The patient is accompanied by her husband today. As you know, Susan Fitzgerald is an 84 year old right-handed woman with an underlying complex medical history of hypertension, bradycardia, paroxysmal A. fib, osteoporosis, history of pelvic fracture, diverticulosis, rheumatoid arthritis, who reports a bilateral upper extremity tremor for the past 2 months or so. Her husband reports that maybe she has had a tremor for 2-6 months. She has no family history of Parkinson's disease. She has not fallen but does catch herself or holds onto a piece of furniture. She does not use a cane or walker. She has scraped her leg recently on her recliner and had quite a bit of bleeding, she was on Eliquis still at the time. She has had elevated temperatures for the past 2+ weeks. She has a history of low back pain. She has a history of T11 compression fracture. She had a recent x-ray on 04/15/2018 of her chest which showed stable cardiomegaly and worsening T11 compression deformity. She is scheduled for a thoracic spine MRI. She sees Dr. Cyndy Freeze for this.  She is no longer on methotrexate. She felt that it was rather helpful for her rheumatoid arthritis and is worried about staying off of it. She also feels that her balance has become worse. I reviewed your office note from 06/08/2018. I also reviewed blood work that she had on 06/08/2018. She has had low-grade temperatures. She had a positive hemoccult test and also had seen blood in the stool, and subsequently was taken off of Eliquis. She has tried to hydrate better with water and her husband reminds her to drink more water. She denies a family  history of tremor.   Her Past Medical History Is Significant For: Past Medical History:  Diagnosis Date  . Arthritis   . Bradycardia 2014  . Cataract   . Diverticulosis   . Dyspnea    periodically  . Dysrhythmia    a - fib  . Heart murmur    ??? bruit  . History of stress test    a. 04/09/13-nuclear stress-no ischemia low risk  . Hypertension   . Menopause   . Osteoporosis   . PAF (paroxysmal atrial fibrillation) ([place])    a. s/p prior DCCV; b. On flecainide & coumadin (CHA2DS2VASc = 4);  c. 03/2016 Echo: EF 55-60%, mod LVH, mod AI, mild to mod TR, PASP 57mmHg.  Marland Kitchen Pelvic fracture [facility])     Her Past Surgical History Is Significant For: Past Surgical History:  Procedure Laterality Date  . BIOPSY BREAST    . BIOPSY BREAST     right & benign  . BREAST SURGERY    . CARDIOVERSION N/A 10/30/2016   Procedure: CARDIOVERSION;  Surgeon: Larey Dresser, MD;  Location: [facility];  Service: Cardiovascular;  Laterality: N/A;  . CARDIOVERSION N/A 08/20/2017   Procedure: CARDIOVERSION;  Surgeon: Jerline Pain, MD;  Location[facility]COPY;  Service: Cardiovascular;  Laterality: N/A;  . CATARACT EXTRACTION    . EYE SURGERY     cataracts  . TEE WITHOUT CARDIOVERSION N/A 10/30/2016   Procedure: TRANSESOPHAGEAL ECHOCARDIOGRAM (TEE);  Surgeon: Larey Dresser, MD;  Location: [facility]  ENDOSCOPY;  Service: Cardiovascular;  Laterality: N/A;  . TOTAL HIP ARTHROPLASTY Right 01/27/2017   Procedure: TOTAL HIP ARTHROPLASTY ANTERIOR APPROACH;  Surgeon: Rod Can, MD;  Location: [facility];  Service: Orthopedics;  Laterality: Right;    Her Family History Is Significant For: Family History  Problem Relation Age of Onset  . Stroke Mother        cerebral hemorrhage  . Heart disease Father        MI  . Heart attack Father   . Vision loss Father   . Heart disease Brother   . Heart attack Brother   . Cancer Maternal Aunt        breast  . Cancer Brother   . Heart disease Brother   . Cancer Daughter         breast    Her Social History Is Significant For: Social History   Socioeconomic History  . Marital status: Married    Spouse name: Not on file  . Number of children: Not on file  . Years of education: Not on file  . Highest education level: Not on file  Occupational History  . Not on file  Social Needs  . Financial resource strain: Not on file  . Food insecurity:    Worry: Not on file    Inability: Not on file  . Transportation needs:    Medical: Not on file    Non-medical: Not on file  Tobacco Use  . Smoking status: Never Smoker  . Smokeless tobacco: Never Used  Substance and Sexual Activity  . Alcohol use: No  . Drug use: No  . Sexual activity: Yes  Lifestyle  . Physical activity:    Days per week: Not on file    Minutes per session: Not on file  . Stress: Not on file  Relationships  . Social connections:    Talks on phone: Not on file    Gets together: Not on file    Attends religious service: Not on file    Active member of club or organization: Not on file    Attends meetings of clubs or organizations: Not on file    Relationship status: Not on file  Other Topics Concern  . Not on file  Social History Narrative  . Not on file    Her Allergies Are:  Allergies  Allergen Reactions  . Benazepril Hcl Cough  :   Her Current Medications Are:  Outpatient Encounter Medications as of 06/10/2018  Medication Sig  . amiodarone (PACERONE) 200 MG tablet Take 200 mg by mouth daily.  . calcium-vitamin D (OSCAL WITH D) 250-125 MG-UNIT per tablet Take 1 tablet by mouth 2 (two) times daily.  Marland Kitchen diltiazem (CARDIZEM CD) 120 MG 24 hr capsule Take 1 capsule (120 mg total) by mouth daily.  . folic acid (FOLVITE) 1 MG tablet Take 1 mg by mouth daily.  . furosemide (LASIX) 20 MG tablet TAKE 1 TO 2 TABLETS BY  MOUTH DAILY AS NEEDED (Patient taking differently: every other day)  . gabapentin (NEURONTIN) 100 MG capsule Take 100 mg by mouth daily as needed.  Marland Kitchen levothyroxine  (SYNTHROID, LEVOTHROID) 75 MCG tablet TAKE 1 TABLET BY MOUTH  DAILY  . lisinopril (PRINIVIL,ZESTRIL) 40 MG tablet TAKE 1 TABLET BY MOUTH  DAILY  . Melatonin 5 MG TABS Take 5 mg by mouth at bedtime.  . Multiple Vitamin (MULTIVITAMIN) capsule Take 1 capsule by mouth daily.  Marland Kitchen apixaban (ELIQUIS) 2.5 MG TABS tablet Take 2.5 mg  by mouth 2 (two) times daily. As directed   Currently on hold  . methotrexate (RHEUMATREX) 10 MG tablet Take 10 mg by mouth once a week. Caution: Chemotherapy. Protect from light.  Marland Kitchen spironolactone (ALDACTONE) 25 MG tablet Take 1 tablet (25 mg total) by mouth daily.  . [DISCONTINUED] pindolol (VISKEN) 10 MG tablet Take 1 tablet (10 mg total) by mouth 2 (two) times daily.   No facility-administered encounter medications on file as of 06/10/2018.   :   Review of Systems:  Out of a complete 14 point review of systems, all are reviewed and negative with the exception of these symptoms as listed below: Review of Systems  Neurological:       New patient consult. Patient is here today to discuss tremor and balance. Patient states within the last month her tremor's in both hands have gotten worse. Patient has not had any falls within the past year and is right hand dominant.     Objective:  Neurological Exam  Physical Exam Physical Examination:   Vitals:   06/10/18 0811  BP: 135/68  Pulse: 86  Temp: (!) 100.8 F (38.2 C)    General Examination: The patient is a very pleasant 84 y.o. female in no acute distress. She appears well-developed and well-nourished and well groomed.   HEENT: Normocephalic, atraumatic, pupils are equal, round and reactive to light and accommodation. Extraocular tracking is good without limitation to gaze excursion or nystagmus noted. Normal smooth pursuit is noted. Hearing is grossly intact. Face is symmetric with normal facial animation and normal facial sensation. Speech is clear with no dysarthria noted. There is no hypophonia. There is no lip,  neck/head, jaw or voice tremor. Neck is supple with full range of passive and active motion. There are no carotid bruits on auscultation. Oropharynx exam reveals: mild mouth dryness, adequate dental hygiene. Tongue protrudes centrally and palate elevates symmetrically.   Chest: Clear to auscultation without wheezing, rhonchi or crackles noted.  Heart: S1+S2+0, regular and normal without murmurs, rubs or gallops noted.   Abdomen: Soft, non-tender and non-distended with normal bowel sounds appreciated on auscultation.  Extremities: There is no pitting edema in the distal lower extremities bilaterally. Pedal pulses are intact.  Skin: Warm and dry without trophic changes noted. Chronic age related changes are noted, she has spider veins in the distal lower extremities in a healing scar left anterior distal leg.   Musculoskeletal:  Reveals arthritic changes in both hands, she is status post right total hip replacement.   Neurologically:  Mental status: The patient is awake, alert and oriented in all 4 spheres. Her immediate and remote memory, attention, language skills and fund of knowledge are appropriate. There is no evidence of aphasia, agnosia, apraxia or anomia. Speech is clear with normal prosody and enunciation. Thought process is linear. Mood is normal and affect is normal.  Cranial nerves II - XII are as described above under HEENT exam. In addition: shoulder shrug is normal with equal shoulder height noted.  On 06/10/2018: On Archimedes spiral drawing she has mild insecurity with the right hand which is her dominant hand and a little worse on the left. Handwriting is slightly tremulous, legible, not micrographic.  Motor exam: Normal bulk, strength and tone is noted. There is no drift. She has no significant resting tremor. She has a mild postural and action tremor in both upper extremities. Fine motor skills are globally mildly impaired, no decrement in amplitude, no parkinsonian rigidity or  cogwheeling.  Romberg is  not test due to safety concerns. Reflexes are 1-2+ throughout.  Cerebellar testing: No dysmetria or intention tremor on finger to nose testing. Heel to shin is unremarkable bilaterally. There is no truncal or gait ataxia.  Sensory exam: intact to light touch in the upper and lower extremities.  Gait, station and balance: She stands easily. She stands naturally slightly wide-based, posture is mildly stooped. She walks slowly and cautiously, preserved arm swing.   Assessment and Plan:   In summary, Susan Fitzgerald is a very pleasant 84 y.o.-year old female with an underlying complex medical history of hypertension, bradycardia, paroxysmal A. fib, osteoporosis, history of pelvic fracture, diverticulosis, rheumatoid arthritis, who presents for evaluation of her hand tremors and balance issues. On examination, she has mild bilateral upper extremity postural and action tremor. Her history is not telltale for essential tremor as she had recent onset of tremors and no family history. Nevertheless, her neurological examination is overall quite benign, no evidence of parkinsonism. She is overall reassured. Tremor treatment symptomatically with medication is not recommended for fear of side effects. In terms of her balance, she has nonspecific issues with her balance, most of which are probably secondary to multiple factors including abnormal posture, tendency to suboptimally hydrated, age, medications etc. I suggested a brain MRI, unfortunately, we cannot do this safely with contrast given her kidney dysfunction. Her creatinine level has improved recently which is good. She is encouraged to continue to hydrate well with water. We will call her with her MRI results. So long as the MRI is age-appropriate I will see her back on an as-needed basis. She is encouraged to talk to about potentially seeing an infectious diseases specialist if there are no further improvements or resolution on her  low-grade fever question. Some blood tests are pending including [facility] spotted fever results. She denies any recent travels or sick contacts or prodromal illness.  I answered all their questions today and the patient and her husband were in agreement. Thank you very much for allowing me to participate in the care of this nice patient. If I can be of any further assistance to you please do not hesitate to call me at [phone].  Sincerely,   Star Age, MD, PhD

## 2018-06-10 NOTE — Telephone Encounter (Signed)
[employer] Medicare order sent to GI. No auth they will reach out to the pt to schedule.  °

## 2018-06-10 NOTE — Patient Instructions (Addendum)
You have a rather mild tremor of both hands. I do not see any signs or symptoms of parkinson's like disease or what we call parkinsonism.   For your tremor, I would not recommend any new medication for fear of side effects (especially sleepiness) or medication interactions, especially in light of your multiple medications and you have felt off balance.   I will request a brain scan, called MRI and call you with the test results. We will have to schedule you for this on a separate date. This test requires authorization from your insurance, and we will take care of the insurance process.  Due to impaired kidney function, I will not be able to order an MRI with contrast, we will do one without contrast.  Please remember, that any kind of tremor may be exacerbated by anxiety, anger, nervousness, excitement, dehydration, sleep deprivation, by caffeine, and low blood sugar values or blood sugar fluctuations. Some medications can exacerbate tremors.   Please remember, that any kind of tremor may be exacerbated by anxiety, anger, nervousness, excitement, dehydration, sleep deprivation, by caffeine, and low blood sugar values or blood sugar fluctuations. Some medications, especially some antidepressants and lithium can cause or exacerbate tremors.   I will see you back as needed. Talk to your primary care about potentially seeing an infectious disease specialist. I don't think you should be concerned about memory loss or even dementia. I do believe you have benefited from trying to drink more water.

## 2018-06-11 ENCOUNTER — Ambulatory Visit (INDEPENDENT_AMBULATORY_CARE_PROVIDER_SITE_OTHER): Payer: Medicare Other

## 2018-06-11 ENCOUNTER — Encounter: Payer: Self-pay | Admitting: Family

## 2018-06-11 ENCOUNTER — Other Ambulatory Visit: Payer: Self-pay | Admitting: Family

## 2018-06-11 ENCOUNTER — Telehealth: Payer: Self-pay | Admitting: Family Medicine

## 2018-06-11 ENCOUNTER — Ambulatory Visit (INDEPENDENT_AMBULATORY_CARE_PROVIDER_SITE_OTHER): Payer: Medicare Other | Admitting: Family

## 2018-06-11 VITALS — BP 130/71 | HR 92 | Temp 101.5°F | Ht 60.0 in | Wt 124.2 lb

## 2018-06-11 DIAGNOSIS — R05 Cough: Secondary | ICD-10-CM

## 2018-06-11 DIAGNOSIS — R531 Weakness: Secondary | ICD-10-CM

## 2018-06-11 DIAGNOSIS — R509 Fever, unspecified: Secondary | ICD-10-CM | POA: Diagnosis not present

## 2018-06-11 DIAGNOSIS — R197 Diarrhea, unspecified: Secondary | ICD-10-CM

## 2018-06-11 DIAGNOSIS — R059 Cough, unspecified: Secondary | ICD-10-CM

## 2018-06-11 LAB — HEPATIC FUNCTION PANEL
ALK PHOS: 63 IU/L (ref 39–117)
ALT: 80 IU/L — AB (ref 0–32)
AST: 78 IU/L — ABNORMAL HIGH (ref 0–40)
Albumin: 3 g/dL — ABNORMAL LOW (ref 3.5–4.7)
BILIRUBIN, DIRECT: 0.17 mg/dL (ref 0.00–0.40)
Bilirubin Total: 0.4 mg/dL (ref 0.0–1.2)
Total Protein: 5.5 g/dL — ABNORMAL LOW (ref 6.0–8.5)

## 2018-06-11 LAB — ROCKY MTN SPOTTED FVR ABS PNL(IGG+IGM)
RMSF IGM: 0.26 {index} (ref 0.00–0.89)
RMSF IgG: NEGATIVE

## 2018-06-11 LAB — LYME AB/WESTERN BLOT REFLEX: Lyme IgG/IgM Ab: <0.91 {ISR} (ref 0.00–0.90)

## 2018-06-11 MED ORDER — DOXYCYCLINE HYCLATE 100 MG PO TABS: 100.0000 mg | ORAL_TABLET | Freq: Two times a day (BID) | ORAL | 0 refills | Status: DC

## 2018-06-11 NOTE — Telephone Encounter (Signed)
Aware how to do stool cultures.

## 2018-06-11 NOTE — Progress Notes (Signed)
Subjective:    Patient ID: Susan Fitzgerald, female    DOB: 1932/04/19, 84 y.o.   MRN: [id number]  Chief Complaint  Patient presents with  . Fever  . borderline nausea  . no appetite   Pt presents to the office today for recurrent fevers that started over two weeks ago. She has had negative RMSF, CBC, BMP, and urine culture. She reports she continues to have chills and fevers of 101.8 F daily.   She reports she had 12 days of diarrhea that is improving, but states she has never had diarrhea like this. She has seen GI and is scheduled fo colonoscopy on 06/16/18.  Fever   This is a recurrent problem. The current episode started 1 to 4 weeks ago. The problem occurs intermittently. The problem has been waxing and waning. The maximum temperature noted was 101 to 101.9 F. Associated symptoms include congestion, coughing, diarrhea, nausea, sleepiness and vomiting. Pertinent negatives include no chest pain, ear pain, headaches, muscle aches, sore throat or urinary pain. She has tried acetaminophen for the symptoms. The treatment provided mild relief.  Diarrhea   The current episode started 1 to 4 weeks ago. The problem occurs less than 2 times per day. The problem has been gradually improving. The stool consistency is described as watery. Associated symptoms include bloating, chills, coughing, a fever, vomiting and weight loss. Pertinent negatives include no headaches. She has tried increased fluids for the symptoms. The treatment provided mild relief.  Cough  This is a new problem. The cough is non-productive. Associated symptoms include chills, a fever and weight loss. Pertinent negatives include no chest pain, ear pain, headaches, nasal congestion, postnasal drip or sore throat.      Review of Systems  Constitutional: Positive for chills, fever and weight loss.  HENT: Positive for congestion. Negative for ear pain, postnasal drip and sore throat.   Respiratory: Positive for cough.     Cardiovascular: Negative for chest pain.  Gastrointestinal: Positive for bloating, diarrhea, nausea and vomiting.  Genitourinary: Negative for dysuria.  Neurological: Negative for headaches.  All other systems reviewed and are negative.      Objective:   Physical Exam  Constitutional: She is oriented to person, place, and time. She appears well-developed and well-nourished. No distress.  HENT:  Head: Normocephalic and atraumatic.  Right Ear: External ear normal.  Mouth/Throat: Oropharynx is clear and moist.  Eyes: Pupils are equal, round, and reactive to light.  Neck: Normal range of motion. Neck supple. No thyromegaly present.  Cardiovascular: Normal rate, regular rhythm, normal heart sounds and intact distal pulses.  No murmur heard. Pulmonary/Chest: Effort normal and breath sounds normal. No respiratory distress. She has no wheezes.  Abdominal: Soft. Bowel sounds are normal. She exhibits no distension. There is no tenderness.  Musculoskeletal: Normal range of motion. She exhibits no edema or tenderness.  Neurological: She is alert and oriented to person, place, and time. She has normal reflexes. No cranial nerve deficit.  Skin: Skin is warm and dry.  Psychiatric: She has a normal mood and affect. Her behavior is normal. Judgment and thought content normal.  Vitals reviewed.     BP 130/71   Pulse 92   Temp (!) 101.5 F (38.6 C) (Oral)   Ht 5' (1.524 m)   Wt 124 lb 3.2 oz (56.3 kg)   BMI 24.26 kg/m      Assessment & Plan:  Susan Fitzgerald comes in today with chief complaint of Fever; borderline nausea; and  no appetite   Diagnosis and orders addressed:  1. Fever, unspecified fever cause - CBC with Differential/Platelet - BMP8+EGFR - Cdiff NAA+O+P+Stool Culture - DG Chest 2 View; Future  2. Cough - CBC with Differential/Platelet - BMP8+EGFR - DG Chest 2 View; Future  3. Diarrhea, unspecified type - CBC with Differential/Platelet - BMP8+EGFR - Cdiff  NAA+O+P+Stool Culture  4. Weakness - CBC with Differential/Platelet - BMP8+EGFR - Cdiff NAA+O+P+Stool Culture - DG Chest 2 View; Future   Labs pending Keep GI appt Stay hydrated If labs and chest x-ray negative will need to send to Infection Disease? Tylenol as needed  Evelina Dun, FNP

## 2018-06-11 NOTE — Patient Instructions (Signed)
Fever, Adult A fever is an increase in the body's temperature. It is usually defined as a temperature of 100F (38C) or higher. Brief mild or moderate fevers generally have no long-term effects, and they often do not require treatment. Moderate or high fevers may make you feel uncomfortable and can sometimes be a sign of a serious illness or disease. The sweating that may occur with repeated or prolonged fever may also cause dehydration. Fever is confirmed by taking a temperature with a thermometer. A measured temperature can vary with:  Age.  Time of day.  Location of the thermometer: ? Mouth (oral). ? Rectum (rectal). ? Ear (tympanic). ? Underarm (axillary). ? Forehead (temporal).  Follow these instructions at home: Pay attention to any changes in your symptoms. Take these actions to help with your condition:  Take over-the counter and prescription medicines only as told by your health care provider. Follow the dosing instructions carefully.  If you were prescribed an antibiotic medicine, take it as told by your health care provider. Do not stop taking the antibiotic even if you start to feel better.  Rest as needed.  Drink enough fluid to keep your urine clear or pale yellow. This helps to prevent dehydration.  Sponge yourself or bathe with room-temperature water to help reduce your body temperature as needed. Do not use ice water.  Do not overbundle yourself in blankets or heavy clothes.  Contact a health care provider if:  You vomit.  You cannot eat or drink without vomiting.  You have diarrhea.  You have pain when you urinate.  Your symptoms do not improve with treatment.  You develop new symptoms.  You develop excessive weakness. Get help right away if:  You have shortness of breath or have trouble breathing.  You are dizzy or you faint.  You are disoriented or confused.  You develop signs of dehydration, such as a dry mouth, decreased urination, or  paleness.  You develop severe pain in your abdomen.  You have persistent vomiting or diarrhea.  You develop a skin rash.  Your symptoms suddenly get worse. This information is not intended to replace advice given to you by your health care provider. Make sure you discuss any questions you have with your health care provider. Document Released: 04/30/2001 Document Revised: 04/11/2016 Document Reviewed: 12/29/2014 Elsevier Interactive Patient Education  2018 [employer] Inc[employer]  

## 2018-06-12 ENCOUNTER — Telehealth: Payer: Self-pay | Admitting: Cardiology

## 2018-06-12 LAB — CBC WITH DIFFERENTIAL/PLATELET
BASOS ABS: 0.1 10*3/uL (ref 0.0–0.2)
Basos: 1 %
EOS (ABSOLUTE): 0.3 10*3/uL (ref 0.0–0.4)
Eos: 3 %
HEMOGLOBIN: 10.6 g/dL — AB (ref 11.1–15.9)
Hematocrit: 31.3 % — ABNORMAL LOW (ref 34.0–46.6)
IMMATURE GRANS (ABS): 0.1 10*3/uL (ref 0.0–0.1)
Immature Granulocytes: 1 %
LYMPHS ABS: 0.7 10*3/uL (ref 0.7–3.1)
LYMPHS: 8 %
MCH: 33.7 pg — ABNORMAL HIGH (ref 26.6–33.0)
MCHC: 33.9 g/dL (ref 31.5–35.7)
MCV: 99 fL — ABNORMAL HIGH (ref 79–97)
Monocytes Absolute: 0.8 10*3/uL (ref 0.1–0.9)
Monocytes: 9 %
NEUTROS ABS: 6.7 10*3/uL (ref 1.4–7.0)
NEUTROS PCT: 78 %
Platelets: 298 10*3/uL (ref 150–450)
RBC: 3.15 x10E6/uL — AB (ref 3.77–5.28)
RDW: 13.6 % (ref 12.3–15.4)
WBC: 8.6 10*3/uL (ref 3.4–10.8)

## 2018-06-12 LAB — BMP8+EGFR
BUN/Creatinine Ratio: 11 — ABNORMAL LOW (ref 12–28)
BUN: 13 mg/dL (ref 8–27)
CO2: 22 mmol/L (ref 20–29)
CREATININE: 1.14 mg/dL — AB (ref 0.57–1.00)
Calcium: 8.2 mg/dL — ABNORMAL LOW (ref 8.7–10.3)
Chloride: 101 mmol/L (ref 96–106)
GFR calc Af Amer: 51 mL/min/{1.73_m2} — ABNORMAL LOW (ref >59)
GFR, EST NON AFRICAN AMERICAN: 44 mL/min/{1.73_m2} — AB (ref >59)
GLUCOSE: 93 mg/dL (ref 65–99)
Potassium: 4.2 mmol/L (ref 3.5–5.2)
Sodium: 138 mmol/L (ref 134–144)

## 2018-06-12 NOTE — Telephone Encounter (Signed)
Patient has been holding eliquis since 7/15 d/t rectal bleeding per notes from GI & PCP. She notified our office of this on 7/15. She was advised to call back after seeing her GI MD. She saw them last week and was told there was nothing that could be done until she has a colonoscopy. She cancelled her colonoscopy for next week since she has been sick as well, now has pneumonia.  She is concerned about her stroke protection since she has been off eliquis for this period of time.   Message routed to MD for advice

## 2018-06-12 NOTE — Telephone Encounter (Signed)
New Message      Patient has been Dx with pneumonia. Patient also states she has been taken off of Eliquis, and wants to know how long she should be without the medication along checking her labs, since been without medication?

## 2018-06-14 NOTE — Telephone Encounter (Signed)
OK to hold DOAC as needed until she has her GI work up.

## 2018-06-15 ENCOUNTER — Telehealth: Payer: Self-pay | Admitting: Family Medicine

## 2018-06-15 NOTE — Telephone Encounter (Signed)
Patient called with MD advice.   She is concerned b/c she likely won't be rescheduled for colonoscopy for at least another month.   She is also concerned b/c she is so out of breath. She states her "numbers are fine" referring to VS but she is wondering if she needs to schedule a monitor f/up with MD

## 2018-06-15 NOTE — Telephone Encounter (Signed)
fyi

## 2018-06-15 NOTE — Telephone Encounter (Signed)
See if she can get an appt with APP (question [place] [facility]) this week.

## 2018-06-15 NOTE — Telephone Encounter (Signed)
Returned call to patient. Explained that MD suggested a f/up. There are no openings with APP in [place] within the next 2 weeks. Patient does not wish to schedule an appointment this week as she has been acutely ill with pneumonia and has other appointments this week. Scheduled her to see K. Lawrence DNP on Monday 8/5 @ 11am per patient request.

## 2018-06-16 ENCOUNTER — Encounter: Payer: Self-pay | Admitting: Family Medicine

## 2018-06-16 ENCOUNTER — Encounter: Payer: Medicare Other | Admitting: Internal Medicine

## 2018-06-16 ENCOUNTER — Ambulatory Visit (INDEPENDENT_AMBULATORY_CARE_PROVIDER_SITE_OTHER): Payer: Medicare Other | Admitting: Family Medicine

## 2018-06-16 ENCOUNTER — Other Ambulatory Visit: Payer: Self-pay

## 2018-06-16 VITALS — BP 143/73 | HR 78 | Temp 99.4°F | Ht 60.0 in | Wt 124.0 lb

## 2018-06-16 DIAGNOSIS — R531 Weakness: Secondary | ICD-10-CM | POA: Diagnosis not present

## 2018-06-16 DIAGNOSIS — J189 Pneumonia, unspecified organism: Secondary | ICD-10-CM

## 2018-06-16 DIAGNOSIS — M459 Ankylosing spondylitis of unspecified sites in spine: Secondary | ICD-10-CM

## 2018-06-16 DIAGNOSIS — R05 Cough: Secondary | ICD-10-CM | POA: Diagnosis not present

## 2018-06-16 DIAGNOSIS — I482 Chronic atrial fibrillation, unspecified: Secondary | ICD-10-CM

## 2018-06-16 DIAGNOSIS — Z1211 Encounter for screening for malignant neoplasm of colon: Secondary | ICD-10-CM

## 2018-06-16 DIAGNOSIS — R059 Cough, unspecified: Secondary | ICD-10-CM

## 2018-06-16 DIAGNOSIS — N184 Chronic kidney disease, stage 4 (severe): Secondary | ICD-10-CM

## 2018-06-16 DIAGNOSIS — I700 Atherosclerosis of aorta: Secondary | ICD-10-CM

## 2018-06-16 DIAGNOSIS — R0602 Shortness of breath: Secondary | ICD-10-CM

## 2018-06-16 DIAGNOSIS — R7989 Other specified abnormal findings of blood chemistry: Secondary | ICD-10-CM

## 2018-06-16 DIAGNOSIS — J181 Lobar pneumonia, unspecified organism: Secondary | ICD-10-CM | POA: Diagnosis not present

## 2018-06-16 LAB — CDIFF NAA+O+P+STOOL CULTURE
E COLI SHIGA TOXIN ASSAY: NEGATIVE
Toxigenic C. Difficile by PCR: NEGATIVE

## 2018-06-16 MED ORDER — AMIODARONE HCL 200 MG PO TABS: 200.0000 mg | ORAL_TABLET | Freq: Every day | ORAL | 0 refills | Status: DC

## 2018-06-16 MED ORDER — DOXYCYCLINE HYCLATE 100 MG PO TABS: 100.0000 mg | ORAL_TABLET | Freq: Two times a day (BID) | ORAL | 0 refills | Status: DC

## 2018-06-16 NOTE — Patient Instructions (Signed)
Patient should continue with her doxycycline for a total of 2 weeks taking 1 twice daily with food She should take Mucinex maximum strength 1 twice daily with a large glass of water She should protect herself respiratory wise wearing a mask while she is getting x-rays that are planned of her spine and brain in the next few days. She should keep the appointment with Dr. Percival Spanish as planned We will try to talk with the cardiologist the rheumatologist and possibly the pulmonologist once we get all of the blood work back to make sure that we are all on the same page. Should continue to drink plenty of fluids and stay well-hydrated. She should follow-up with a nephrologist as planned.

## 2018-06-16 NOTE — Progress Notes (Signed)
Subjective:    Patient ID: Susan Fitzgerald, female    DOB: 04/19/1932, 84 y.o.   MRN: [id number]  HPI Pt here for follow up on pneumonia, cough, and SOB.  The patient is here today to follow-up her treatment with doxycycline for the pneumonia that was reported by the radiologist after her chest x-ray last week.  Several things are going on.  She does have some bleeding and her Eliquis was stopped by the cardiologist.  She also has had a rise in her creatinine and the medication for her rheumatoid arthritis was put on hold by the rheumatologist.  Prior to last week when she came in with a diagnosis of pneumonia she been having loose bowel movements and feeling malaise for a couple of weeks.  The loose bowel movements seem to be getting better.  After the diagnosis of pneumonia she was put on doxycycline.  She really does not feel a lot better and she is still getting very short winded.  She does have upcoming appointments with [facility] to get an MRI of her back.  This will be on the 31st which is tomorrow.  She also has a visit scheduled with the nephrologist on August 12 at 1030.  On Friday she is supposed to get an MRI of the brain.  She has an appointment on the fifth with Dr. Percival Spanish.  This is actually with his PA.  She will have a follow-up visit with the neurosurgeon following the MRI of her back with Dr. Christella Noa on Tuesday, August 6.  Patient is just feeling very tired.  The extent of this illness has been going back for at least 3 weeks or so with the pneumonia being the most recent diagnosis.  Her stools are more formed.  She has not had any chest pain and has only the shortness of breath which seems to be worse with exertion.  The cough is nonproductive.    Patient Active Problem List   Diagnosis Date Noted  . New onset of confusion 06/08/2018  . Fever 06/08/2018  . Weakness 02/25/2018  . Hypothyroidism 09/18/2017  . SOB (shortness of breath) 07/13/2017  . Spinal stenosis of  lumbar region with neurogenic claudication 03/20/2017  . Atrial fibrillation with rapid ventricular response ([place]) 02/10/2017  . Atrial fibrillation ([place]) 02/10/2017  . Avascular necrosis of hip, right ([place]) 01/27/2017  . Avascular necrosis of bone of right hip ([place]) 01/27/2017  . Abdominal aortic atherosclerosis ([place]) 09/24/2016  . Rheumatoid arthritis ([place]) 05/07/2016  . Symptomatic bradycardia 06/02/2013  . Hypertension 04/21/2013  . Osteoporosis 04/21/2013  . Atrial fibrillation, chronic ([place]) 02/09/2013   Outpatient Encounter Medications as of 06/16/2018  Medication Sig  . amiodarone (PACERONE) 200 MG tablet Take 200 mg by mouth daily.  . calcium-vitamin D (OSCAL WITH D) 250-125 MG-UNIT per tablet Take 1 tablet by mouth 2 (two) times daily.  Marland Kitchen diltiazem (CARDIZEM CD) 120 MG 24 hr capsule Take 1 capsule (120 mg total) by mouth daily.  Marland Kitchen doxycycline (VIBRA-TABS) 100 MG tablet Take 1 tablet (100 mg total) by mouth 2 (two) times daily.  . folic acid (FOLVITE) 1 MG tablet Take 1 mg by mouth daily.  . furosemide (LASIX) 20 MG tablet TAKE 1 TO 2 TABLETS BY  MOUTH DAILY AS NEEDED (Patient taking differently: every other day)  . gabapentin (NEURONTIN) 100 MG capsule Take 100 mg by mouth daily as needed.  Marland Kitchen levothyroxine (SYNTHROID, LEVOTHROID) 75 MCG tablet TAKE 1 TABLET BY MOUTH  DAILY  .  lisinopril (PRINIVIL,ZESTRIL) 40 MG tablet TAKE 1 TABLET BY MOUTH  DAILY  . Melatonin 5 MG TABS Take 5 mg by mouth at bedtime.  . Multiple Vitamin (MULTIVITAMIN) capsule Take 1 capsule by mouth daily.  Marland Kitchen apixaban (ELIQUIS) 2.5 MG TABS tablet Take 2.5 mg by mouth 2 (two) times daily. As directed   Currently on hold  . methotrexate (RHEUMATREX) 10 MG tablet Take 10 mg by mouth once a week. Caution: Chemotherapy. Protect from light.  Marland Kitchen spironolactone (ALDACTONE) 25 MG tablet Take 1 tablet (25 mg total) by mouth daily.  . [DISCONTINUED] pindolol (VISKEN) 10 MG tablet Take 1 tablet (10 mg total) by mouth 2 (two)  times daily.   No facility-administered encounter medications on file as of 06/16/2018.       Review of Systems  Constitutional: Positive for fatigue. Negative for fever.  HENT: Negative.   Eyes: Negative.   Respiratory: Positive for cough (non productive) and shortness of breath.   Cardiovascular: Negative.   Gastrointestinal: Negative.   Endocrine: Negative.   Genitourinary: Negative.   Musculoskeletal: Negative.   Skin: Negative.   Allergic/Immunologic: Negative.   Neurological: Negative.   Hematological: Negative.   Psychiatric/Behavioral: Negative.        Objective:   Physical Exam  Constitutional: She is oriented to person, place, and time. She appears well-developed and well-nourished. She appears distressed.  The patient is calm and feeling bad with no energy and ongoing shortness of breath.  Her pulse ox was 95%.  HENT:  Head: Normocephalic and atraumatic.  Right Ear: External ear normal.  Left Ear: External ear normal.  Nose: Nose normal.  Mouth/Throat: Oropharynx is clear and moist. No oropharyngeal exudate.  Eyes: Pupils are equal, round, and reactive to light. Conjunctivae and EOM are normal. Right eye exhibits no discharge. Left eye exhibits no discharge. No scleral icterus.  Neck: Normal range of motion. Neck supple. No thyromegaly present.  Cardiovascular: Normal rate, regular rhythm, normal heart sounds and intact distal pulses.  No murmur heard. Heart is actually regular today at 72/min  Pulmonary/Chest: Effort normal and breath sounds normal. No respiratory distress. She has no wheezes. She has no rales.  Abdominal: Soft. Bowel sounds are normal. She exhibits no mass. There is tenderness.  Slight epigastric tenderness without liver or spleen enlargement masses bruits or inguinal adenopathy  Musculoskeletal: Normal range of motion. She exhibits no edema.  Lymphadenopathy:    She has no cervical adenopathy.  Neurological: She is alert and oriented to  person, place, and time. She has normal reflexes. No cranial nerve deficit.  Skin: Skin is warm and dry. No erythema.  Psychiatric: She has a normal mood and affect. Her behavior is normal. Judgment and thought content normal.  The patient's mood affect and behavior are normal for her.  Nursing note and vitals reviewed.   BP (!) 143/73 (BP Location: Right Arm)   Pulse 78   Temp 99.4 F (37.4 C) (Oral)   Ht 5' (1.524 m)   Wt 124 lb (56.2 kg)   SpO2 95% Comment: room air  BMI 24.22 kg/m   It is important to note that the patient's initial pulse ox was 95% and after walking to the lab from the exam room it was checked again when she was sitting there was 86%.     Assessment & Plan:  1. SOB (shortness of breath) -Patient has x-ray diagnosis of right upper lobe opacity indicating possible pneumonia and has taken 6 days worth of antibiotics so  far.  She is no longer running a fever and her sputum is not productive. - BMP8+EGFR - Brain natriuretic peptide - CBC with Differential/Platelet - Sedimentation rate  2. Cough -She is still coughing some.  She is going to take some Mucinex twice daily for cough and congestion. - BMP8+EGFR - CBC with Differential/Platelet - Sedimentation rate  3. Screen for colon cancer -She has had some blood in the stool and her Eliquis has been stopped and she was scheduled actually for a colonoscopy today but because of the diagnosis of pneumonia this was postponed. - CBC with Differential/Platelet - Fecal occult blood, imunochemical; Future  4. Weakness generalized -The patient has a lot of weakness.  This has been progressive over the past 2 to 3 weeks.  She has very little energy.  Her rheumatoid medicines have been stopped her Eliquis has been stopped.  The rheumatoid medicines were stopped because of a rise in the creatinine and the Eliquis was stopped because of blood in the stool.  5. Pneumonia of right upper lobe due to infectious organism  [facility]) -Continue to take doxycycline and return to this office in 8 days.  6. CKD (chronic kidney disease) stage 4, GFR 15-29 ml/min (HCC) -Follow-up with Dr. Florene Glen as planned  7. Elevated serum creatinine -Follow-up with Dr. Florene Glen as planned  8. Atrial fibrillation, chronic ([place]) -Follow-up with cardiology as planned  9. Aortic atherosclerosis (HCC) -Continue healthy eating and therapeutic lifestyle changes  10. 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 Amil Amen once there is in agreement on medication following the rise in her creatinine and a visit to the nephrologist.  Meds ordered this encounter  Medications  . doxycycline (VIBRA-TABS) 100 MG tablet    Sig: Take 1 tablet (100 mg total) by mouth 2 (two) times daily.    Dispense:  8 tablet    Refill:  0   Patient Instructions  Patient should continue with her doxycycline for a total of 2 weeks taking 1 twice daily with food She should take Mucinex maximum strength 1 twice daily with a large glass of water She should protect herself respiratory wise wearing a mask while she is getting x-rays that are planned of her spine and brain in the next few days. She should keep the appointment with Dr. Percival Spanish as planned We will try to talk with the cardiologist the rheumatologist and possibly the pulmonologist once we get all of the blood work back to make sure that we are all on the same page. Should continue to drink plenty of fluids and stay well-hydrated. She should follow-up with a nephrologist as planned.  Arrie Senate MD

## 2018-06-17 ENCOUNTER — Ambulatory Visit
Admission: RE | Admit: 2018-06-17 | Discharge: 2018-06-17 | Disposition: A | Discharge status: Home / Self Care | Payer: Medicare Other | Source: Ambulatory Visit | Attending: Neurosurgery | Admitting: Neurosurgery

## 2018-06-17 DIAGNOSIS — S22080G Wedge compression fracture of T11-T12 vertebra, subsequent encounter for fracture with delayed healing: Secondary | ICD-10-CM

## 2018-06-17 DIAGNOSIS — M8448XA Pathological fracture, other site, initial encounter for fracture: Secondary | ICD-10-CM | POA: Diagnosis not present

## 2018-06-17 LAB — BMP8+EGFR
BUN/Creatinine Ratio: 14 (ref 12–28)
BUN: 15 mg/dL (ref 8–27)
CALCIUM: 8.4 mg/dL — AB (ref 8.7–10.3)
CHLORIDE: 94 mmol/L — AB (ref 96–106)
CO2: 24 mmol/L (ref 20–29)
Creatinine, Ser: 1.04 mg/dL — ABNORMAL HIGH (ref 0.57–1.00)
GFR calc non Af Amer: 49 mL/min/{1.73_m2} — ABNORMAL LOW (ref >59)
GFR, EST AFRICAN AMERICAN: 57 mL/min/{1.73_m2} — AB (ref >59)
GLUCOSE: 109 mg/dL — AB (ref 65–99)
POTASSIUM: 3.5 mmol/L (ref 3.5–5.2)
Sodium: 136 mmol/L (ref 134–144)

## 2018-06-17 LAB — CBC WITH DIFFERENTIAL/PLATELET
BASOS ABS: 0.1 10*3/uL (ref 0.0–0.2)
BASOS: 1 %
EOS (ABSOLUTE): 0.3 10*3/uL (ref 0.0–0.4)
Eos: 2 %
HEMOGLOBIN: 10.8 g/dL — AB (ref 11.1–15.9)
Hematocrit: 32.7 % — ABNORMAL LOW (ref 34.0–46.6)
Immature Grans (Abs): 0.1 10*3/uL (ref 0.0–0.1)
Immature Granulocytes: 1 %
LYMPHS ABS: 0.9 10*3/uL (ref 0.7–3.1)
Lymphs: 8 %
MCH: 33 pg (ref 26.6–33.0)
MCHC: 33 g/dL (ref 31.5–35.7)
MCV: 100 fL — AB (ref 79–97)
MONOCYTES: 10 %
Monocytes Absolute: 1.2 10*3/uL — ABNORMAL HIGH (ref 0.1–0.9)
NEUTROS ABS: 9.7 10*3/uL — AB (ref 1.4–7.0)
Neutrophils: 78 %
Platelets: 310 10*3/uL (ref 150–450)
RBC: 3.27 x10E6/uL — ABNORMAL LOW (ref 3.77–5.28)
RDW: 13.7 % (ref 12.3–15.4)
WBC: 12.3 10*3/uL — ABNORMAL HIGH (ref 3.4–10.8)

## 2018-06-17 LAB — BRAIN NATRIURETIC PEPTIDE: BNP: 175.8 pg/mL — AB (ref 0.0–100.0)

## 2018-06-17 LAB — SEDIMENTATION RATE: Sed Rate: 18 mm/hr (ref 0–40)

## 2018-06-17 NOTE — Telephone Encounter (Signed)
-----   Message from Chipper Herb, MD sent at 06/17/2018  2:11 PM EDT ----- The BNP is slightly elevated.  She is to be seen by cardiology in the morning.  Please call patient with results and forward to Dr. Percival Spanish.

## 2018-06-17 NOTE — Telephone Encounter (Signed)
Patient has OV 06/18/18 with Suanne Marker PA

## 2018-06-17 NOTE — Telephone Encounter (Signed)
Pt notified of results Verbalizes understanding 

## 2018-06-18 ENCOUNTER — Encounter: Payer: Self-pay | Admitting: Physician Assistant

## 2018-06-18 ENCOUNTER — Telehealth: Payer: Self-pay

## 2018-06-18 ENCOUNTER — Ambulatory Visit: Payer: Medicare Other | Admitting: Physician Assistant

## 2018-06-18 VITALS — BP 126/68 | HR 75 | Temp 98.6°F | Ht 60.0 in | Wt 123.8 lb

## 2018-06-18 DIAGNOSIS — I482 Chronic atrial fibrillation, unspecified: Secondary | ICD-10-CM

## 2018-06-18 DIAGNOSIS — R0602 Shortness of breath: Secondary | ICD-10-CM | POA: Diagnosis not present

## 2018-06-18 DIAGNOSIS — R0902 Hypoxemia: Secondary | ICD-10-CM

## 2018-06-18 MED ORDER — PANTOPRAZOLE SODIUM 40 MG PO TBEC: 40.0000 mg | DELAYED_RELEASE_TABLET | Freq: Every day | ORAL | 11 refills | Status: DC

## 2018-06-18 NOTE — Patient Instructions (Addendum)
Medication Instructions:  Your physician recommends that you continue on your current medications as directed. Please refer to the Current Medication list given to you today.   Labwork: None ordered  Testing/Procedures: Your physician recommends that you have a chest xray in the middle of August. A chest x-ray takes a picture of the organs and structures inside the chest, including the heart, lungs, and blood vessels. This test can show several things, including, whether the heart is enlarges; whether fluid is building up in the lungs; and whether pacemaker / defibrillator leads are still in place.  Follow-Up: You have been referred to pulmonology: Appointment made for 06/23/18 at 10:00 with Dr. Valeta Harms. Arrive 15 minutes prior to your appointment   Your physician wants you to follow-up with Dr. Percival Spanish next available   Any Other Special Instructions Will Be Listed Below (If Applicable).  HOME OXYGEN HAS BEEN ORDERED FOR YOU TO WEAR 2 LITERS PER MINUTE: CONTINUOUS   DRINK A BOOST WITH EVERY MEAL THAT DOES NOT EQUAL AT LEAST 200 CALORIES   If you need a refill on your cardiac medications before your next appointment, please call your pharmacy.

## 2018-06-18 NOTE — Telephone Encounter (Signed)
Called and left a detailed message per DPR on patient's VM, letting her know that per Rosaria Ferries, PA she can take protonix 40 mg QD and gas-X for her burping. Instructed the patient to call back with any questions.

## 2018-06-18 NOTE — Progress Notes (Addendum)
Cardiology Office Note   Date:  06/18/2018   ID:  Susan Fitzgerald, DOB [birth year], MRN [id number]  PCP:  Chipper Herb, MD  Cardiologist: Dr. Percival Spanish, 02/25/2018 Susan Ferries, PA-C     History of Present Illness: Susan Fitzgerald is a 84 y.o. female with a history of recurrent Afib on flecainide (dose reduced due to prolonged QT, then discontinued as she had recurrent A. Fib), Lexiscan MV 06/2017 w/ no ischemia and mildly reduced EF, EF initially 40-45% by echo but 55-60% x 01/2018 echo, diverticulosis, HTN, RA on methotrexate.  4/10 office visit, patient having periodic weak spells, pindolol and diltiazem dose is reduced, decreased exercise tolerance, low heart rate at times, no heart racing.  Holter monitor with no significant arrhythmias Phone notes starting 4/28 regarding recurrent symptoms, event monitor ordered Phone notes starting 7/15 regarding LGI bleed, Eliquis on hold PCP visits in May, June, and July, for costochondritis compression fracture T11, anal bleeding and elevated creatinine  Susan Fitzgerald presents for cardiology follow up.   She has not been waking up with LE edema. She gets some during the day, it goes away overnight. Denies PND or orthopnea. Has DOE, worse than it used to be. She now gets SOB walking within the house. Cannot do anything without getting SOB. Sx going on for a while, but seem to be acutely worse in the last few weeks.   She was having problems w/ cough, had temp up to 102. ABX started 07/25 and no more fevers. She completes the ABX in a few days.   She was dx w/ costochondritis based on severe chest wall tenderness>>steroids.   Felt much better on the steroids in general, but shortness of breath returned again once she came off them.   Appetite has been poor, eating very little. Taking Boost but not very much. Not drinking very much water. Belches a lot, no N&V.   Per husband, pt O2 sats dropped when walking at [facility] office, not reported  to staff.   She is getting tremors, has seen neurology, is supposed to get an MRI on her brain.    Past Medical History:  Diagnosis Date  . Arthritis   . Bradycardia 2014  . Cataract   . Diverticulosis   . Dyspnea    periodically  . Dysrhythmia    a - fib  . Heart murmur    ??? bruit  . History of stress test    a. 04/09/13-nuclear stress-no ischemia low risk  . Hypertension   . Menopause   . Osteoporosis   . PAF (paroxysmal atrial fibrillation) (Susan Fitzgerald)    a. s/p prior DCCV; b. On flecainide & coumadin (CHA2DS2VASc = 4);  c. 03/2016 Echo: EF 55-60%, mod LVH, mod AI, mild to mod TR, PASP 50mmHg.  Marland Kitchen Pelvic fracture [facility])     Past Surgical History:  Procedure Laterality Date  . BIOPSY BREAST    . BIOPSY BREAST     right & benign  . BREAST SURGERY    . CARDIOVERSION N/A 10/30/2016   Procedure: CARDIOVERSION;  Surgeon: Larey Dresser, MD;  Location: [place];  Service: Cardiovascular;  Laterality: N/A;  . CARDIOVERSION N/A 08/20/2017   Procedure: CARDIOVERSION;  Surgeon: Jerline Pain, MD;  Location: [facility] ENDOSCOPY;  Service: Cardiovascular;  Laterality: N/A;  . CATARACT EXTRACTION    . EYE SURGERY     cataracts  . TEE WITHOUT CARDIOVERSION N/A 10/30/2016   Procedure: TRANSESOPHAGEAL ECHOCARDIOGRAM (TEE);  Surgeon:  Larey Dresser, MD;  Location: [place];  Service: Cardiovascular;  Laterality: N/A;  . TOTAL HIP ARTHROPLASTY Right 01/27/2017   Procedure: TOTAL HIP ARTHROPLASTY ANTERIOR APPROACH;  Surgeon: Rod Can, MD;  Location: [place];  Service: Orthopedics;  Laterality: Right;    Current Outpatient Medications  Medication Sig Dispense Refill  . amiodarone (PACERONE) 200 MG tablet Take 1 tablet (200 mg total) by mouth daily. 90 tablet 0  . calcium-vitamin D (OSCAL WITH D) 250-125 MG-UNIT per tablet Take 1 tablet by mouth 2 (two) times daily.    Marland Kitchen diltiazem (CARDIZEM CD) 120 MG 24 hr capsule Take 1 capsule (120 mg total) by mouth daily. 90 capsule 3  .  doxycycline (VIBRA-TABS) 100 MG tablet Take 1 tablet (100 mg total) by mouth 2 (two) times daily. 8 tablet 0  . folic acid (FOLVITE) 1 MG tablet Take 1 mg by mouth daily.    . furosemide (LASIX) 20 MG tablet TAKE 1 TO 2 TABLETS BY  MOUTH DAILY AS NEEDED (Patient taking differently: Take 1 tablet by mouth every other day.) 180 tablet 1  . gabapentin (NEURONTIN) 100 MG capsule Take 100 mg by mouth daily as needed.    Marland Kitchen levothyroxine (SYNTHROID, LEVOTHROID) 75 MCG tablet TAKE 1 TABLET BY MOUTH  DAILY 90 tablet 3  . lisinopril (PRINIVIL,ZESTRIL) 40 MG tablet TAKE 1 TABLET BY MOUTH  DAILY 90 tablet 0  . Melatonin 5 MG TABS Take 5 mg by mouth at bedtime.    Marland Kitchen apixaban (ELIQUIS) 2.5 MG TABS tablet On hold Take 2.5 mg by mouth 2 (two) times daily. As directed   Currently on hold    . methotrexate (RHEUMATREX) 10 MG tablet On hold Take 10 mg by mouth once a week. Caution: Chemotherapy. Protect from light.    . Multiple Vitamin (MULTIVITAMIN) capsule Take 1 capsule by mouth daily.    Marland Kitchen spironolactone (ALDACTONE) 25 MG tablet On hold Take 1 tablet (25 mg total) by mouth daily. (Patient not taking: Reported on 06/18/2018) 90 tablet 3   No current facility-administered medications for this visit.     Allergies:   Benazepril hcl    Social History:  The patient  reports that she has never smoked. She has never used smokeless tobacco. She reports that she does not drink alcohol or use drugs.   Family History:  The patient's family history includes Cancer in her brother, daughter, and maternal aunt; Heart attack in her brother and father; Heart disease in her brother, brother, and father; Stroke in her mother; Vision loss in her father.    ROS:  Please see the history of present illness. All other systems are reviewed and negative.    PHYSICAL EXAM: VS:  BP 126/68   Pulse 75   Temp 98.6 F (37 C)   Ht 5' (1.524 m)   Wt 123 lb 12.8 oz (56.2 kg)   BMI 24.18 kg/m  , BMI Body mass index is 24.18  kg/m. GEN: Well nourished, well developed, female in no acute distress  HEENT: normal for age  Neck: Minimal JVD, no carotid bruit, no masses Cardiac: RRR; soft murmur, no rubs, or gallops Respiratory: Decreased breath sounds diffusely (worse in the bases) with scattered rales bilaterally, normal work of breathing GI: soft, nontender, nondistended, + BS  MS: no deformity or atrophy; no edema; distal pulses are 2+ in all 4 extremities   Skin: warm and dry, no rash Neuro:  Strength and sensation are intact Psych: euthymic mood, full  affect                  On room air                          oxygen satuations                    HeartRate At rest  90%  81  Walking  79%  90  Walking  77%  97  Post-ambulation  87%  78                   On 2 L of oxygen                   oxygen satuations                    HeartRate At rest  96%  73  Walking  93%  76  Walking  93%  83  Post-ambulation  98%  70    EKG:  EKG is ordered today. The ekg ordered today demonstrates atrial fibrillation, heart rate 75  Echo: 01/27/2018 - Left ventricle: The cavity size was normal. Wall thickness was   increased in a pattern of mild LVH. There was mild focal basal   hypertrophy of the septum. Systolic function was normal. The   estimated ejection fraction was in the range of 55% to 60%. Wall   motion was normal; there were no regional wall motion   abnormalities. Doppler parameters are consistent with abnormal   left ventricular relaxation (grade 1 diastolic dysfunction). - Aortic valve: There was mild regurgitation. - Ascending aorta: The ascending aorta was mildly dilated. - Mitral valve: Calcified annulus. - Pulmonary arteries: Systolic pressure was mildly increased.  Impressions:  - Normal LV systolic function; mild diastolic dysfunction; mild   LVH; calcified aortic valve with mild AI; mildly dilated   ascending aorta; mild TR; mild pulmonary hypertension.  Recent Labs: 01/27/2018: NT-Pro BNP  631 06/08/2018: ALT 80; TSH 0.689 06/16/2018: BNP 175.8; BUN 15; Creatinine, Ser 1.04; Hemoglobin 10.8; Platelets 310; Potassium 3.5; Sodium 136    Lipid Panel    Component Value Date/Time   CHOL 166 09/15/2017 0845   CHOL 168 08/28/2015 0935   CHOL 131 04/14/2013 0836   TRIG 67 09/15/2017 0845   TRIG 69 04/14/2013 0836   HDL 71 09/15/2017 0845   HDL 53 04/14/2013 0836   CHOLHDL 2.5 08/28/2015 0935   LDLCALC 90 08/28/2015 0935   LDLCALC 64 04/14/2013 0836   LDLDIRECT 98 04/20/2014 1015     Wt Readings from Last 3 Encounters:  06/18/18 123 lb 12.8 oz (56.2 kg)  06/16/18 124 lb (56.2 kg)  06/11/18 124 lb 3.2 oz (56.3 kg)     Other studies Reviewed: Additional studies/ records that were reviewed today include: Office notes, hospital records and testing.  ASSESSMENT AND PLAN:  1.  Atrial fibrillation: Her heart rate increased with exertion appropriately.  She is on amiodarone and diltiazem for rate control.  Pindolol has been discontinued. - She has been on amiodarone for a year now.  Thyroid functions have been checked and were within normal limits.  Her LFTs were checked by her PCP.  Her ALT was slightly elevated, other values normal. -However, I am concerned that she is developing amiodarone lung toxicity. -Pulmonary referral has been made.  2.  Hypoxia: I am concerned about amiodarone lung toxicity.  Pulmonary referral has  been made. - She was ambulated in the office and clearly qualifies for home O2.  This was ordered. -Although her BNP was elevated at 175.8 when checked by her PCP, I do not think CHF explains all of her hypoxia. -She also has pulmonary hypertension, that may contribute. - She was recently diagnosed with pneumonia and had interstitial changes in her right upper lobe.  This would be an atypical pneumonia.  It was recommended at that she get a recheck in 3-4 weeks from the date of the chest x-ray which was 7/25.  The patient and her husband are aware.  3.   Anticoagulation: Her Eliquis is on hold because of rectal bleeding.  She is anemic, but the anemia was stable when recently checked.  Follow-up with PCP for this.  4.  Renal insufficiency: Her BUN/creatinine recently bumped to 31/2.03, unknown reasons.  Creatinine is back down to 1.04 when checked 2 days ago by her PCP.  5.  Dyspnea on exertion: The patient and her husband are concerned that this might be from coronary artery disease.  Her last stress test was a year ago and was without ischemia.  Her EF was normal by echocardiogram a few months ago.  She has not had chest pain. - Because of her renal insufficiency, I am reluctant to order any dye studies. - Because of her hypoxia and possible pneumonia, I feel these issues should be addressed before we think about repeating her stress test. -I discussed this with the patient and her family, they agree and understand.  6.  Chronic diastolic CHF, mild pulmonary hypertension: Her weight is stable on low-dose diuretic, but that may be due to poor p.o. intake. - She has not been on specific therapy for the pulmonary hypertension because it was mild and not felt to be affecting her significantly. - We can always repeat the echocardiogram to see if her pulmonary hypertension has worsened, but will await input from the Pulmonologist first.    Current medicines are reviewed at length with the patient today.  The patient has concerns regarding medicines.  Concerns were addressed  The following changes have been made: None  Labs/ tests ordered today include:   Orders Placed This Encounter  Procedures  . For home use only DME oxygen  . DG Chest 2 View  . Ambulatory referral to Pulmonology  . EKG 12-Lead     Disposition:   FU with Dr. Percival Spanish  Signed, Susan Ferries, PA-C  06/18/2018 6:20 PM    [facility] Phone: [phone]; Fax: [phone]  This note was written with the assistance of speech recognition  software. Please excuse any transcriptional errors.

## 2018-06-19 ENCOUNTER — Telehealth: Payer: Self-pay | Admitting: Pulmonary Disease

## 2018-06-19 ENCOUNTER — Telehealth: Payer: Self-pay | Admitting: Physician Assistant

## 2018-06-19 ENCOUNTER — Ambulatory Visit
Admission: RE | Admit: 2018-06-19 | Discharge: 2018-06-19 | Disposition: A | Discharge status: Home / Self Care | Payer: Medicare Other | Source: Ambulatory Visit | Attending: Neurology | Admitting: Neurology

## 2018-06-19 DIAGNOSIS — I679 Cerebrovascular disease, unspecified: Secondary | ICD-10-CM

## 2018-06-19 DIAGNOSIS — R2689 Other abnormalities of gait and mobility: Secondary | ICD-10-CM

## 2018-06-19 DIAGNOSIS — R251 Tremor, unspecified: Secondary | ICD-10-CM | POA: Diagnosis not present

## 2018-06-19 HISTORY — DX: Cerebrovascular disease, unspecified: I67.9

## 2018-06-19 NOTE — Telephone Encounter (Signed)
New Message          [facility] with [facility] is calling to get patient  verified for oxygen that was order by "Suanne Marker Barrett". Susan Fitzgerald needs to verify some information. Pls call

## 2018-06-19 NOTE — Telephone Encounter (Signed)
Called and spoke with McRoberts from [facility]. He states that the patient is trying to be qualified for o2 however she does not have a respiratory diagnosis that her insurance company will take to qualify her. Patient is currently being treated for pneumonia and this will not be accepted since she could be having SOB due to the acute issue. Corene Cornea states that there is some note of possible ILD or possible pulm hypertension. Either of these can be used to qualify patient. Patient needs to be qualified at visit on 8.6.60 with Dr. Valeta Harms. Will route to him to follow up on at visit.

## 2018-06-19 NOTE — Telephone Encounter (Signed)
S/w Corene Cornea at [facility], he states that pt needs an interstitial lung issue-pneumonia does not qualify for the home O2. He did notice that on the note it does say=it is possible the mild pulm htn will work but she will need to pay themselves if the cayse of the sob  is because of the pneumonia if this clears up and the pt is still needing the O2  pt will need a dx of chf or some sort of chronic respiratory dx would be much better=mild pulmonary htn will need some sort of treatment like lasix linked so this will work(stating that lasix is the treatment used for the Pulmonary htn). He states that he will also await pulmonary appt to see if they can link at that appt. Will have pt sign a waiver if she wants it before. He will call and let them know what they need to do on the note for that appt. He will call back if he needs Suanne Marker to make any changes to her note.

## 2018-06-19 NOTE — Telephone Encounter (Signed)
See note

## 2018-06-22 ENCOUNTER — Ambulatory Visit: Payer: Medicare Other | Admitting: Adult Health

## 2018-06-22 NOTE — Progress Notes (Signed)
Please call patient regarding the recent brain MRI: The brain scan showed a normal structure of the brain and mild volume loss which we call atrophy. There were changes in the deeper structures of the brain, which we call white matter changes or microvascular changes. These were reported as mild in Her case. These are tiny white spots, that occur with time and are seen in a variety of conditions, including with normal aging, chronic hypertension, chronic headaches, especially migraine HAs, chronic diabetes, chronic hyperlipidemia. These are not strokes and no mass or lesion were seen which is reassuring. Again, there were no acute findings, such as a stroke, or mass or blood products.  Overall, age-appropriate findings. Nothing to explain tremor or balance problem.  No further action is required on this test at this time, other than re-enforcing the importance of good blood pressure control, good cholesterol control, good blood sugar control, and weight management. Please remind patient to keep any upcoming appointments or tests and to call us with any interim questions, concerns, problems or updates. Thanks,  Star Age, MD, PhD

## 2018-06-23 ENCOUNTER — Ambulatory Visit: Admission: RE | Admit: 2018-06-23 | Payer: Medicare Other | Source: Ambulatory Visit

## 2018-06-23 ENCOUNTER — Telehealth: Payer: Self-pay

## 2018-06-23 ENCOUNTER — Encounter: Payer: Self-pay | Admitting: Pulmonary Disease

## 2018-06-23 ENCOUNTER — Telehealth: Payer: Self-pay | Admitting: Pulmonary Disease

## 2018-06-23 ENCOUNTER — Ambulatory Visit: Payer: Medicare Other | Admitting: Pulmonary Disease

## 2018-06-23 ENCOUNTER — Ambulatory Visit
Admission: RE | Admit: 2018-06-23 | Discharge: 2018-06-23 | Disposition: A | Discharge status: Home / Self Care | Payer: Medicare Other | Source: Ambulatory Visit | Attending: Pulmonary Disease | Admitting: Pulmonary Disease

## 2018-06-23 ENCOUNTER — Telehealth: Payer: Self-pay | Admitting: Family Medicine

## 2018-06-23 ENCOUNTER — Other Ambulatory Visit: Payer: Medicare Other

## 2018-06-23 VITALS — BP 144/76 | HR 75 | Ht 60.0 in | Wt 124.0 lb

## 2018-06-23 DIAGNOSIS — R0602 Shortness of breath: Secondary | ICD-10-CM

## 2018-06-23 DIAGNOSIS — J9601 Acute respiratory failure with hypoxia: Secondary | ICD-10-CM | POA: Diagnosis not present

## 2018-06-23 DIAGNOSIS — J9809 Other diseases of bronchus, not elsewhere classified: Secondary | ICD-10-CM | POA: Insufficient documentation

## 2018-06-23 DIAGNOSIS — J849 Interstitial pulmonary disease, unspecified: Secondary | ICD-10-CM

## 2018-06-23 DIAGNOSIS — I482 Chronic atrial fibrillation, unspecified: Secondary | ICD-10-CM

## 2018-06-23 DIAGNOSIS — I679 Cerebrovascular disease, unspecified: Secondary | ICD-10-CM

## 2018-06-23 DIAGNOSIS — I517 Cardiomegaly: Secondary | ICD-10-CM

## 2018-06-23 DIAGNOSIS — R0609 Other forms of dyspnea: Secondary | ICD-10-CM | POA: Diagnosis not present

## 2018-06-23 DIAGNOSIS — R748 Abnormal levels of other serum enzymes: Secondary | ICD-10-CM

## 2018-06-23 DIAGNOSIS — I5032 Chronic diastolic (congestive) heart failure: Secondary | ICD-10-CM

## 2018-06-23 DIAGNOSIS — M069 Rheumatoid arthritis, unspecified: Secondary | ICD-10-CM

## 2018-06-23 HISTORY — DX: Other diseases of bronchus, not elsewhere classified: J98.09

## 2018-06-23 NOTE — Telephone Encounter (Signed)
Spoke with Lauren at [employer] for ILD dx the CT needs to be High Res. I have taken care of this order and [employer] has co-signed with me. Nothing more needed at this time.

## 2018-06-23 NOTE — Telephone Encounter (Signed)
Per Caryl Pina, this was taken care of at today's visit. Corene Cornea with [facility] is aware. Nothing further needed; will sign off.

## 2018-06-23 NOTE — Telephone Encounter (Signed)
Called and spoke with pt regarding O2 concerns Pt is to wear O2 with exertion Order to [facility] states O2 with exertion and at night (for insurance purposes) Advised pt O2 is for exertion only Pt verbalized understanding, nothing further needed.

## 2018-06-23 NOTE — Telephone Encounter (Signed)
-----   Message from Star Age, MD sent at 06/22/2018  7:51 AM EDT ----- Please call patient regarding the recent brain MRI: The brain scan showed a normal structure of the brain and mild volume loss which we call atrophy. There were changes in the deeper structures of the brain, which we call white matter changes or microvascular changes. These were reported as mild in Her case. These are tiny white spots, that occur with time and are seen in a variety of conditions, including with normal aging, chronic hypertension, chronic headaches, especially migraine HAs, chronic diabetes, chronic hyperlipidemia. These are not strokes and no mass or lesion were seen which is reassuring. Again, there were no acute findings, such as a stroke, or mass or blood products.  Overall, age-appropriate findings. Nothing to explain tremor or balance problem.  No further action is required on this test at this time, other than re-enforcing the importance of good blood pressure control, good cholesterol control, good blood sugar control, and weight management. Please remind patient to keep any upcoming appointments or tests and to call us with any interim questions, concerns, problems or updates. Thanks,  Star Age, MD, PhD

## 2018-06-23 NOTE — Progress Notes (Signed)
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  Subjective:   PATIENT ID: [name] GENDER: female DOB: 1932-05-13, MRN: [id number]  Chief Complaint  Patient presents with  . Consult    Referred by Dr. Percival Spanish for worsening SOB with any exertion X3 weeks.      Patient has a history of AFIB, placed on flecainide developed prolonged QTc and was stopped, prior mildly reduced EF 40-45% however most recent improved 55-60%, HTN, RA on MTX, 01/2016 +RF, +Anti-CCP, both low level with elevated ESR.   Over the past year has notice her SOB. However of the past several months her SOB has been limiting her activity. She was an avid golfer and has recently been unable due to her SOB. She was started on prednisone for costochondritis back in June. During that time while she was on prednisone she felt much better.   She was started on amiodarone around 2 years ago currently taking 265m daily. Was on higher loading dose for a while when she started. She felt sick on it at first and it was dose reduced by cardiology.   She was diagnosed with RA, and sees Dr. BStark Bray She is off the MTX due to elevated liver enzymes and elevated Cr. All of her hand symptoms resolved on low dose MTX but this has since been stopped. She saw Dr. BAmil Amenabout a month.   Past Medical History:  Diagnosis Date  . Arthritis   . Bradycardia 2014  . Calcification of bronchial airway 06/23/2018   01/2017 - See on CT imaging of the chest   . Cataract   . Cerebral vascular disease 06/19/2018   06/19/2018 - MRI with chronic microvascular ischemic change  . Diverticulosis   . Dyspnea    periodically  . Dysrhythmia    a - fib  . Heart murmur    ??? bruit  . History of stress test    a. 04/09/13-nuclear stress-no ischemia low risk  . Hypertension   . Menopause   . Osteoporosis   . PAF (paroxysmal atrial fibrillation) ([place]    a. s/p prior DCCV; b. On flecainide &  coumadin (CHA2DS2VASc = 4);  c. 03/2016 Echo: EF 55-60%, mod LVH, mod AI, mild to mod TR, PASP 532mg.  . Marland Kitchenelvic fracture (HCC)      Family History  Problem Relation Age of Onset  . Stroke Mother        cerebral hemorrhage  . Heart disease Father        MI  . Heart attack Father   . Vision loss Father   . Heart disease Brother   . Heart attack Brother   . Cancer Maternal Aunt        breast  . Cancer Brother   . Heart disease Brother   . Cancer Daughter        breast     Social History   Socioeconomic History  . Marital status: Married    Spouse name: Not on file  . Number of children: Not on file  . Years of education: Not on file  . Highest education level: Not on file  Occupational History  . Not on file  Social Needs  . Financial resource strain: Not on file  . Food insecurity:    Worry: Not on file    Inability: Not on file  . Transportation needs:    Medical: Not on file    Non-medical: Not  on file  Tobacco Use  . Smoking status: Never Smoker  . Smokeless tobacco: Never Used  Substance and Sexual Activity  . Alcohol use: No  . Drug use: No  . Sexual activity: Yes  Lifestyle  . Physical activity:    Days per week: Not on file    Minutes per session: Not on file  . Stress: Not on file  Relationships  . Social connections:    Talks on phone: Not on file    Gets together: Not on file    Attends religious service: Not on file    Active member of club or organization: Not on file    Attends meetings of clubs or organizations: Not on file    Relationship status: Not on file  . Intimate partner violence:    Fear of current or ex partner: Not on file    Emotionally abused: Not on file    Physically abused: Not on file    Forced sexual activity: Not on file  Other Topics Concern  . Not on file  Social History Narrative  . Not on file     Allergies  Allergen Reactions  . Benazepril Hcl Cough     Outpatient Medications Prior to Visit  Medication  Sig Dispense Refill  . amiodarone (PACERONE) 200 MG tablet Take 1 tablet (200 mg total) by mouth daily. 90 tablet 0  . calcium-vitamin D (OSCAL WITH D) 250-125 MG-UNIT per tablet Take 1 tablet by mouth 2 (two) times daily.    Marland Kitchen diltiazem (CARDIZEM CD) 120 MG 24 hr capsule Take 1 capsule (120 mg total) by mouth daily. 90 capsule 3  . doxycycline (VIBRA-TABS) 100 MG tablet Take 1 tablet (100 mg total) by mouth 2 (two) times daily. 8 tablet 0  . folic acid (FOLVITE) 1 MG tablet Take 1 mg by mouth daily.    . furosemide (LASIX) 20 MG tablet TAKE 1 TO 2 TABLETS BY  MOUTH DAILY AS NEEDED (Patient taking differently: Take 1 tablet by mouth every other day.) 180 tablet 1  . gabapentin (NEURONTIN) 100 MG capsule Take 100 mg by mouth daily as needed.    Marland Kitchen levothyroxine (SYNTHROID, LEVOTHROID) 75 MCG tablet TAKE 1 TABLET BY MOUTH  DAILY 90 tablet 3  . lisinopril (PRINIVIL,ZESTRIL) 40 MG tablet TAKE 1 TABLET BY MOUTH  DAILY 90 tablet 0  . Melatonin 5 MG TABS Take 5 mg by mouth at bedtime.    . Multiple Vitamin (MULTIVITAMIN) capsule Take 1 capsule by mouth daily.    Marland Kitchen apixaban (ELIQUIS) 2.5 MG TABS tablet Take 2.5 mg by mouth 2 (two) times daily. As directed   Currently on hold    . methotrexate (RHEUMATREX) 10 MG tablet Take 10 mg by mouth once a week. Caution: Chemotherapy. Protect from light.    . pantoprazole (PROTONIX) 40 MG tablet Take 1 tablet (40 mg total) by mouth daily. (Patient not taking: Reported on 06/23/2018) 30 tablet 11  . spironolactone (ALDACTONE) 25 MG tablet Take 1 tablet (25 mg total) by mouth daily. (Patient not taking: Reported on 06/18/2018) 90 tablet 3   No facility-administered medications prior to visit.     Review of Systems  Constitutional: Positive for malaise/fatigue. Negative for fever and weight loss.  HENT: Positive for sore throat. Negative for congestion, ear pain and nosebleeds.   Eyes: Negative for redness.  Respiratory: Positive for shortness of breath. Negative for  cough and wheezing.   Cardiovascular: Negative for palpitations, leg swelling and PND.  Gastrointestinal: Negative for nausea and vomiting.  Genitourinary: Negative for dysuria.  Musculoskeletal: Negative for joint pain.  Skin: Negative for rash.  Neurological: Negative for headaches.  Endo/Heme/Allergies: Does not bruise/bleed easily.  Psychiatric/Behavioral: Negative for depression. The patient is not nervous/anxious.     Objective:  Physical Exam  Constitutional: She is oriented to person, place, and time. She appears well-developed and well-nourished. No distress.  HENT:  Head: Normocephalic and atraumatic.  Mouth/Throat: Oropharynx is clear and moist.  Eyes: Pupils are equal, round, and reactive to light. Conjunctivae are normal. No scleral icterus.  Neck: Neck supple. No JVD present. No tracheal deviation present.  Cardiovascular: Normal rate, regular rhythm and intact distal pulses.  Murmur (systolic murmur LLSB ) heard. Pulmonary/Chest: Effort normal and breath sounds normal. No accessory muscle usage or stridor. No tachypnea. No respiratory distress. She has no wheezes. She has no rhonchi. She has no rales.  Bibasilar inspiratory crackles.   Abdominal: Soft. Bowel sounds are normal. She exhibits no distension. There is no tenderness.  Musculoskeletal: She exhibits no edema or tenderness.  Lymphadenopathy:    She has no cervical adenopathy.  Neurological: She is alert and oriented to person, place, and time.  Skin: Skin is warm and dry. Capillary refill takes less than 2 seconds. No rash noted.  Psychiatric: She has a normal mood and affect. Her behavior is normal.  Vitals reviewed.   Vitals:   06/23/18 1006  BP: (!) 144/76  Pulse: 75  SpO2: 93%  Weight: 124 lb (56.2 kg)  Height: 5' (1.524 m)   93% on RA  BMI Readings from Last 3 Encounters:  06/23/18 24.22 kg/m  06/18/18 24.18 kg/m  06/16/18 24.22 kg/m   Wt Readings from Last 3 Encounters:  06/23/18 124 lb  (56.2 kg)  06/18/18 123 lb 12.8 oz (56.2 kg)  06/16/18 124 lb (56.2 kg)    CBC    Component Value Date/Time   WBC 12.3 (H) 06/16/2018 1435   WBC 4.8 08/15/2017 1206   RBC 3.27 (L) 06/16/2018 1435   RBC 4.05 08/15/2017 1206   HGB 10.8 (L) 06/16/2018 1435   HCT 32.7 (L) 06/16/2018 1435   PLT 310 06/16/2018 1435   MCV 100 (H) 06/16/2018 1435   MCH 33.0 06/16/2018 1435   MCH 30.6 08/15/2017 1206   MCHC 33.0 06/16/2018 1435   MCHC 32.0 08/15/2017 1206   RDW 13.7 06/16/2018 1435   LYMPHSABS 0.9 06/16/2018 1435   MONOABS 0.8 02/10/2017 0332   EOSABS 0.3 06/16/2018 1435   BASOSABS 0.1 06/16/2018 1435   Lab Results  Component Value Date   ALT 80 (H) 06/08/2018   AST 78 (H) 06/08/2018   ALKPHOS 63 06/08/2018   BILITOT 0.4 06/08/2018   Lab Results  Component Value Date   ESRSEDRATE 18 06/16/2018   Lab Results  Component Value Date   CREATININE 1.04 (H) 06/16/2018    Chest Imaging: CT imaging from 2017 was reviewed with basilar intersitial changes (my read)  CXR from 05/2018 reviewed with bilateral prominent interstitial markings. (my read)    Pulmonary Functions Testing Results: No results found for: FEV1, FVC, FEV1FVC, TLC, DLCO  FeNO: None   Pathology: None   Echocardiogram:  01/27/2018 Study Conclusions  - Left ventricle: The cavity size was normal. Wall thickness was   increased in a pattern of mild LVH. There was mild focal basal   hypertrophy of the septum. Systolic function was normal. The   estimated ejection fraction was in the range of  55% to 60%. Wall   motion was normal; there were no regional wall motion   abnormalities. Doppler parameters are consistent with abnormal   left ventricular relaxation (grade 1 diastolic dysfunction). - Aortic valve: There was mild regurgitation. - Ascending aorta: The ascending aorta was mildly dilated. - Mitral valve: Calcified annulus. - Pulmonary arteries: Systolic pressure was mildly  increased.  Impressions:  - Normal LV systolic function; mild diastolic dysfunction; mild   LVH; calcified aortic valve with mild AI; mildly dilated   ascending aorta; mild TR; mild pulmonary hypertension.  Heart Catheterization: none     Assessment & Plan:   Shortness of breath - Plan: Pulmonary Function Test, CT Chest Wo Contrast, Ambulatory Referral for DME  Interstitial pulmonary disease ([place]) - Plan: CT Chest Wo Contrast, Ambulatory Referral for DME  Acute hypoxemic respiratory failure ([place]) - Walked for O2 needs today in the office. 87% standing, remains stable on 2LNC with ambulation to maintain sats >88%   Rheumatoid arthritis, involving unspecified site, unspecified rheumatoid factor presence (HCC)  Cerebral vascular disease  Calcification of bronchial airway  Diastolic dysfunction with chronic heart failure (HCC)  Right ventricular enlargement  Atrial fibrillation, chronic (HCC)  Elevated liver enzymes  Discussion: This very sweet elderly lady who has had progressive SOB for the past several weeks which was first noticed over a year ago. Her CXR has bilateral interstitial markings and reticulations and on exam she has bilateral inspiratory crackles both concerning for an underlying ILD. The differential remains broad with her history of MTX use (now stopped), history of RA prior positive anti-CCP as well as prolonged use of amiodarone. She does not have baseline PFTs to compare DLCO. She has no infectious symptoms at this time. She has been off MTX for 3 weeks so would still be consider partially immunosuppressed.   She needs dedicated HRCT for ILD protocol, insp/exp/prone and supine imaging to be completed ASAP.  She will need FULL PFTS and 6MWT. I have recommended that she stops her amiodarone for the time being. It has a very long half life and if her condition is truly amiodarone induced lung toxicity the additional of her O2 requirement could theoretically worsen  the disease. I will send a message to her cardiologist to let them know that we have stopped the amiodarone while we are working her up.  We have requested records from her rheumatologist  I feel leary of starting empiric steroids at this time however we discussed risks verse benefit. It may come to a trial steroids if we feel this is solely drug induced. I think we should hold off from that at the moment and discontinue the amiodarone.     Current Outpatient Medications:  .  amiodarone (PACERONE) 200 MG tablet, Take 1 tablet (200 mg total) by mouth daily., Disp: 90 tablet, Rfl: 0 .  calcium-vitamin D (OSCAL WITH D) 250-125 MG-UNIT per tablet, Take 1 tablet by mouth 2 (two) times daily., Disp: , Rfl:  .  diltiazem (CARDIZEM CD) 120 MG 24 hr capsule, Take 1 capsule (120 mg total) by mouth daily., Disp: 90 capsule, Rfl: 3 .  doxycycline (VIBRA-TABS) 100 MG tablet, Take 1 tablet (100 mg total) by mouth 2 (two) times daily., Disp: 8 tablet, Rfl: 0 .  folic acid (FOLVITE) 1 MG tablet, Take 1 mg by mouth daily., Disp: , Rfl:  .  furosemide (LASIX) 20 MG tablet, TAKE 1 TO 2 TABLETS BY  MOUTH DAILY AS NEEDED (Patient taking differently: Take 1 tablet  by mouth every other day.), Disp: 180 tablet, Rfl: 1 .  gabapentin (NEURONTIN) 100 MG capsule, Take 100 mg by mouth daily as needed., Disp: , Rfl:  .  levothyroxine (SYNTHROID, LEVOTHROID) 75 MCG tablet, TAKE 1 TABLET BY MOUTH  DAILY, Disp: 90 tablet, Rfl: 3 .  lisinopril (PRINIVIL,ZESTRIL) 40 MG tablet, TAKE 1 TABLET BY MOUTH  DAILY, Disp: 90 tablet, Rfl: 0 .  Melatonin 5 MG TABS, Take 5 mg by mouth at bedtime., Disp: , Rfl:  .  Multiple Vitamin (MULTIVITAMIN) capsule, Take 1 capsule by mouth daily., Disp: , Rfl:  .  apixaban (ELIQUIS) 2.5 MG TABS tablet, Take 2.5 mg by mouth 2 (two) times daily. As directed   Currently on hold, Disp: , Rfl:  .  methotrexate (RHEUMATREX) 10 MG tablet, Take 10 mg by mouth once a week. Caution: Chemotherapy. Protect from  light., Disp: , Rfl:  .  pantoprazole (PROTONIX) 40 MG tablet, Take 1 tablet (40 mg total) by mouth daily. (Patient not taking: Reported on 06/23/2018), Disp: 30 tablet, Rfl: [place], DO [facility] 06/23/2018 11:14 AM

## 2018-06-23 NOTE — Progress Notes (Signed)
Thanks for the update

## 2018-06-23 NOTE — Patient Instructions (Addendum)
We are going to get a CT Scan of the chest for further evaluation.  We will stop her amiodarone today and I will send a message to your cardiologist about this for the time being during her work up.  We will get full PFTs with 6MWT  We will need to obtain records from her rheumatologist Dr. Amil Amen and most recent office visit.  RTC in 2-3 weeks following imaging and studies.

## 2018-06-24 ENCOUNTER — Telehealth: Payer: Self-pay | Admitting: Cardiology

## 2018-06-24 ENCOUNTER — Telehealth: Payer: Self-pay | Admitting: Internal Medicine

## 2018-06-24 DIAGNOSIS — I482 Chronic atrial fibrillation, unspecified: Secondary | ICD-10-CM

## 2018-06-24 NOTE — Telephone Encounter (Signed)
New problem    Pt need to speak to nurse concerning her amiodarone. Please call pt.

## 2018-06-24 NOTE — Telephone Encounter (Signed)
Medical Records received from Dr Amedeo Plenty [facility], sending interoffice mail to [facility] 06/24/18  LM

## 2018-06-24 NOTE — Telephone Encounter (Signed)
Call into Dr Percival Spanish Will await his recommendation per Dr Laurance Flatten

## 2018-06-24 NOTE — Telephone Encounter (Signed)
She will start the Lasix everyday.  Also, she can restart Elqiuis.  She needs CBC and BMET in one week.  She is not having bleeding.  I did review the results of the CT.

## 2018-06-24 NOTE — Telephone Encounter (Signed)
I spoke with patient and reviewed results with her. Patient voiced understanding and appreciation. She did not have any questions at this time and will call back if anything changes.

## 2018-06-24 NOTE — Progress Notes (Signed)
Thanks

## 2018-06-24 NOTE — Telephone Encounter (Signed)
Spoke with the pt today.. Dr. Valeta Harms Pulmonary took her off of Amiodarone yesterday due to her increased shortness of breath. She is now on home oxygen. She is also still off of her Eliquis since 7/15 secondary to blood in her stool. She was suppose to have a colonoscopy but it was rescheduled until 08/03/18 due to her SOB. She is coming back to see Dr. Percival Spanish 07/03/18. But, she is concerned being off of her antiarrythmic and her blood thinner. She is also not taking Asprin. Advised her that I will forward to Dr. Percival Spanish to let him know all of this and see if he would like to make any changes prior to her visit next week.

## 2018-06-24 NOTE — Telephone Encounter (Signed)
He is discussed this with patient's cardiologist so that everything that is done is done correctly.  Let patient know of that conversation.

## 2018-06-25 MED ORDER — FUROSEMIDE 20 MG PO TABS: 20.0000 mg | ORAL_TABLET | Freq: Every day | ORAL | 1 refills | Status: DC

## 2018-06-25 NOTE — Telephone Encounter (Signed)
Follow up: ° ° °Patient returning call back °

## 2018-06-25 NOTE — Telephone Encounter (Signed)
Spoke with patient. Patient states that Dr.Hochrein called her yesterday evening and discussed his recommendation with her.  Patient is to resume Eliquis 2.5mg  bid, and change Furosemide to 20mg  daily. Patient to have a bmet and cbc drawn in 1 week.  Patient's med list updated. Lab orders are in Epic, patient will [facility] to have [facility] drawn. Patient will keep her upcoming appt with Dr.Hochrein on 07/06/18.  Patient agreeable with plan and verbalized understanding.

## 2018-06-25 NOTE — Telephone Encounter (Signed)
Called patient to give Dr.Hochrein's recommendation. [email or web address].

## 2018-06-26 NOTE — Telephone Encounter (Signed)
Dr Percival Spanish spoke with pt

## 2018-06-29 ENCOUNTER — Ambulatory Visit: Payer: Medicare Other | Admitting: Family Medicine

## 2018-06-29 ENCOUNTER — Telehealth: Payer: Self-pay

## 2018-06-29 DIAGNOSIS — N183 Chronic kidney disease, stage 3 (moderate): Secondary | ICD-10-CM | POA: Diagnosis not present

## 2018-06-29 DIAGNOSIS — N184 Chronic kidney disease, stage 4 (severe): Secondary | ICD-10-CM | POA: Diagnosis not present

## 2018-06-29 DIAGNOSIS — I129 Hypertensive chronic kidney disease with stage 1 through stage 4 chronic kidney disease, or unspecified chronic kidney disease: Secondary | ICD-10-CM | POA: Diagnosis not present

## 2018-06-29 DIAGNOSIS — N179 Acute kidney failure, unspecified: Secondary | ICD-10-CM | POA: Diagnosis not present

## 2018-06-29 NOTE — Telephone Encounter (Signed)
Sent notes to [facility]

## 2018-06-30 ENCOUNTER — Ambulatory Visit (INDEPENDENT_AMBULATORY_CARE_PROVIDER_SITE_OTHER): Payer: Medicare Other | Admitting: Family Medicine

## 2018-06-30 ENCOUNTER — Encounter: Payer: Self-pay | Admitting: Family Medicine

## 2018-06-30 VITALS — BP 140/70 | HR 70 | Temp 98.5°F | Ht 60.0 in | Wt 121.0 lb

## 2018-06-30 DIAGNOSIS — M459 Ankylosing spondylitis of unspecified sites in spine: Secondary | ICD-10-CM

## 2018-06-30 DIAGNOSIS — I482 Chronic atrial fibrillation, unspecified: Secondary | ICD-10-CM

## 2018-06-30 DIAGNOSIS — J849 Interstitial pulmonary disease, unspecified: Secondary | ICD-10-CM

## 2018-06-30 DIAGNOSIS — E039 Hypothyroidism, unspecified: Secondary | ICD-10-CM

## 2018-06-30 DIAGNOSIS — R531 Weakness: Secondary | ICD-10-CM

## 2018-06-30 DIAGNOSIS — I700 Atherosclerosis of aorta: Secondary | ICD-10-CM

## 2018-06-30 DIAGNOSIS — R0602 Shortness of breath: Secondary | ICD-10-CM

## 2018-06-30 DIAGNOSIS — I10 Essential (primary) hypertension: Secondary | ICD-10-CM | POA: Diagnosis not present

## 2018-06-30 DIAGNOSIS — N184 Chronic kidney disease, stage 4 (severe): Secondary | ICD-10-CM

## 2018-06-30 NOTE — Progress Notes (Signed)
Subjective:    Patient ID: Susan Fitzgerald, female    DOB: February 21, 1932, 84 y.o.   MRN: [id number]  HPI Pt here for follow up and management of chronic medical problems which includes hypertension and hyperlipidemia. She is taking medication regularly.  The patient continues to feel weak and remains short of breath.  She says her shortness of breath is about the same.  We do know for fact that her kidney and renal function are improving and we are not sure why.  This may be due to stopping the methotrexate or stopping the amiodarone.  This is one piece of good news.  She does have a follow-up visit scheduled with cardiology and with pulmonology.  Both of these are coming in the next week.  These 2 specialist will talk together and hopefully this interstitial lung disease will continue to improve as she remains off the amiodarone and the methotrexate.  She must make all efforts to stay as strong as possible and stay out of crowds of people and use her O2 regularly as discussed by the pulmonologist.  Since her renal function has improved future visits to the nephrologist for the time being have been postponed.  The patient today denies any chest pain just shortness of breath.  She did come into the office without her oxygen and the O2 level was down to 86.  Her husband went and got the O2 and she is back on the oxygen.  She denies any chest pain.  She has this shortness of breath.  She denies any trouble with her GI tract other than may be some constipation but she is not eating a lot.  She will try to do better and drink more water.  She has not seen any blood in the stool or had any black tarry bowel movements.  She is passing her water well.    Patient Active Problem List   Diagnosis Date Noted  . Calcification of bronchial airway 06/23/2018  . Diastolic dysfunction with chronic heart failure ([place]) 06/23/2018  . Right ventricular enlargement 06/23/2018  . Interstitial pulmonary disease ([place]) 06/23/2018   . Elevated liver enzymes 06/23/2018  . Cerebral vascular disease 06/19/2018  . New onset of confusion 06/08/2018  . Fever 06/08/2018  . Weakness 02/25/2018  . Hypothyroidism 09/18/2017  . Shortness of breath 07/13/2017  . Spinal stenosis of lumbar region with neurogenic claudication 03/20/2017  . Avascular necrosis of hip, right ([place]) 01/27/2017  . Avascular necrosis of bone of right hip ([place]) 01/27/2017  . Abdominal aortic atherosclerosis ([place]) 09/24/2016  . Rheumatoid arthritis ([place]) 05/07/2016  . Symptomatic bradycardia 06/02/2013  . Hypertension 04/21/2013  . Osteoporosis 04/21/2013  . Atrial fibrillation, chronic ([place]) 02/09/2013   Outpatient Encounter Medications as of 06/30/2018  Medication Sig  . apixaban (ELIQUIS) 2.5 MG TABS tablet Take 2.5 mg by mouth 2 (two) times daily. As directed   Currently on hold  . calcium-vitamin D (OSCAL WITH D) 250-125 MG-UNIT per tablet Take 1 tablet by mouth 2 (two) times daily.  Marland Kitchen diltiazem (CARDIZEM CD) 120 MG 24 hr capsule Take 1 capsule (120 mg total) by mouth daily.  . folic acid (FOLVITE) 1 MG tablet Take 1 mg by mouth daily.  . furosemide (LASIX) 20 MG tablet Take 1 tablet (20 mg total) by mouth daily.  Marland Kitchen gabapentin (NEURONTIN) 100 MG capsule Take 100 mg by mouth daily as needed.  Marland Kitchen levothyroxine (SYNTHROID, LEVOTHROID) 75 MCG tablet TAKE 1 TABLET BY MOUTH  DAILY  .  lisinopril (PRINIVIL,ZESTRIL) 40 MG tablet TAKE 1 TABLET BY MOUTH  DAILY  . Melatonin 5 MG TABS Take 5 mg by mouth at bedtime.  . Multiple Vitamin (MULTIVITAMIN) capsule Take 1 capsule by mouth daily.  . [DISCONTINUED] pindolol (VISKEN) 10 MG tablet Take 1 tablet (10 mg total) by mouth 2 (two) times daily.  . methotrexate (RHEUMATREX) 10 MG tablet Take 10 mg by mouth once a week. Caution: Chemotherapy. Protect from light.  . pantoprazole (PROTONIX) 40 MG tablet Take 1 tablet (40 mg total) by mouth daily. (Patient not taking: Reported on 06/23/2018)  . [DISCONTINUED]  doxycycline (VIBRA-TABS) 100 MG tablet Take 1 tablet (100 mg total) by mouth 2 (two) times daily.   No facility-administered encounter medications on file as of 06/30/2018.       Review of Systems  Constitutional: Positive for appetite change (decrease ) and fatigue.  HENT: Negative.   Eyes: Negative.   Respiratory: Positive for cough (dry) and shortness of breath.   Cardiovascular: Negative.   Gastrointestinal: Negative.   Endocrine: Negative.   Genitourinary: Negative.   Musculoskeletal: Negative.   Skin: Negative.   Allergic/Immunologic: Negative.   Neurological: Negative.        "off-balance"  Hematological: Negative.   Psychiatric/Behavioral: Negative.        Objective:   Physical Exam  Constitutional: She is oriented to person, place, and time. She appears well-developed and well-nourished. No distress.  Patient comes to the visit today with her husband.  She actually looks better than when I saw her the last time even though she says she is not breathing that much better.  HENT:  Head: Normocephalic and atraumatic.  Nose: Nose normal.  Eyes: Pupils are equal, round, and reactive to light. Conjunctivae and EOM are normal. Right eye exhibits no discharge. Left eye exhibits no discharge. No scleral icterus.  Neck: Normal range of motion. Neck supple. No thyromegaly present.  Cardiovascular: Normal rate, regular rhythm, normal heart sounds and intact distal pulses.  No murmur heard. Heart today is regular at 72/min.  Pulmonary/Chest: Effort normal. No respiratory distress. She has no wheezes. She has rales.  A few basilar crackles at both bases posteriorly.  Otherwise the chest was clear anteriorly and posteriorly and even with coughing a dry cough.  Abdominal: Soft. Bowel sounds are normal. She exhibits no mass. There is tenderness. There is no guarding.  Generalized tenderness without masses organ enlargement or bruit  Musculoskeletal: Normal range of motion. She exhibits  no edema.  Lymphadenopathy:    She has no cervical adenopathy.  Neurological: She is alert and oriented to person, place, and time. She has normal reflexes. She displays normal reflexes. No cranial nerve deficit.  The patient is alert and moving all extremities well and is sensitive to the palpation of the bones in her lower legs when checking for edema.  Skin: Skin is warm and dry. No rash noted.  Psychiatric: She has a normal mood and affect. Her behavior is normal. Judgment and thought content normal.  The patient's mood affect and behavior are normal.  Nursing note and vitals reviewed.  BP (!) 154/76 (BP Location: Left Arm)   Pulse 70   Temp 98.5 F (36.9 C) (Oral)   Ht 5' (1.524 m)   Wt 121 lb (54.9 kg)   BMI 23.63 kg/m   Repeat blood pressure was 140/70 in the right arm sitting with a manual cuff      Assessment & Plan:  1. Atrial fibrillation, chronic ([place]) -  The patient today was in normal sinus rhythm at 72/min - CBC with Differential/Platelet  2. Aortic atherosclerosis (HCC) -Try to eat healthy drink more fluids - CBC with Differential/Platelet  3. Hypothyroidism, unspecified type -Continue current treatment - CBC with Differential/Platelet  4. Essential hypertension -Blood pressure was better on the second check to get a and no change in treatment - BMP8+EGFR - CBC with Differential/Platelet  5. Weakness generalized -This appears to be stable though my assessment is she may be feeling and looking a little bit better.  6. SOB (shortness of breath) -We will continue O2 as recommended by the pulmonologist  7. 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  8. Interstitial lung disease ([place]) -Follow-up with pulmonology as planned  9. CKD (chronic kidney disease) stage 4, GFR 15-29 ml/min (HCC) -Renal  function has greatly improved and the nephrologist said to come back only if the renal function deteriorates again.  No orders of the defined types were placed in this encounter.  Patient Instructions                       Medicare Annual Wellness Visit  [facility] and the medical providers at [facility] strive to bring you the best medical care.  In doing so we not only want to address your current medical conditions and concerns but also to detect new conditions early and prevent illness, disease and health-related problems.    Medicare offers a yearly Wellness Visit which allows our clinical staff to assess your need for preventative services including immunizations, lifestyle education, counseling to decrease risk of preventable diseases and screening for fall risk and other medical concerns.    This visit is provided free of charge (no copay) for all Medicare recipients. The clinical pharmacists at [facility] have begun to conduct these Wellness Visits which will also include a thorough review of all your medications.    As you primary medical provider recommend that you make an appointment for your Annual Wellness Visit if you have not done so already this year.  You may set up this appointment before you leave today or you may call back ([phone]) and schedule an appointment.  Please make sure when you call that you mention that you are scheduling your Annual Wellness Visit with the clinical pharmacist so that the appointment may be made for the proper length of time.    Continue current medications. Continue good therapeutic lifestyle changes which include good diet and exercise. Fall precautions discussed with patient. If an FOBT was given today- please return it to our front desk. If you are over 68 years old - you may need Prevnar 20 or the adult Pneumonia vaccine.  **Flu shots are available--- please call and schedule a FLU-CLINIC  appointment**  After your visit with [place] today you will receive a survey in the mail or online from [employer] regarding your care with [place]. Please take a moment to fill this out. Your feedback is very important to [place] as you can help [place] better understand your patient needs as well as improve your experience and satisfaction. WE CARE ABOUT YOU!!!   The patient should make a special effort to stay as active as possible in the home wearing her oxygen. She should try to drink plenty of water and improve her nourishment  situation to stay strong. She should keep the follow-up visit with the cardiologist and the pulmonologist. Gastroenterology visit and colonoscopy have been delayed until the end of September. Continue to monitor for any blood in the stool or black tarry bowel movements and let someone know about this if this occurs.  Arrie Senate MD

## 2018-06-30 NOTE — Patient Instructions (Addendum)
Medicare Annual Wellness Visit  [facility] and the medical providers at [facility] strive to bring you the best medical care.  In doing so we not only want to address your current medical conditions and concerns but also to detect new conditions early and prevent illness, disease and health-related problems.    Medicare offers a yearly Wellness Visit which allows our clinical staff to assess your need for preventative services including immunizations, lifestyle education, counseling to decrease risk of preventable diseases and screening for fall risk and other medical concerns.    This visit is provided free of charge (no copay) for all Medicare recipients. The clinical pharmacists at [facility] have begun to conduct these Wellness Visits which will also include a thorough review of all your medications.    As you primary medical provider recommend that you make an appointment for your Annual Wellness Visit if you have not done so already this year.  You may set up this appointment before you leave today or you may call back ([phone]) and schedule an appointment.  Please make sure when you call that you mention that you are scheduling your Annual Wellness Visit with the clinical pharmacist so that the appointment may be made for the proper length of time.    Continue current medications. Continue good therapeutic lifestyle changes which include good diet and exercise. Fall precautions discussed with patient. If an FOBT was given today- please return it to our front desk. If you are over 38 years old - you may need Prevnar 43 or the adult Pneumonia vaccine.  **Flu shots are available--- please call and schedule a FLU-CLINIC appointment**  After your visit with [place] today you will receive a survey in the mail or online from [employer] regarding your care with [place]. Please take a moment to fill this out. Your feedback is very  important to [place] as you can help [place] better understand your patient needs as well as improve your experience and satisfaction. WE CARE ABOUT YOU!!!   The patient should make a special effort to stay as active as possible in the home wearing her oxygen. She should try to drink plenty of water and improve her nourishment situation to stay strong. She should keep the follow-up visit with the cardiologist and the pulmonologist. Gastroenterology visit and colonoscopy have been delayed until the end of September. Continue to monitor for any blood in the stool or black tarry bowel movements and let someone know about this if this occurs.

## 2018-07-01 LAB — CBC WITH DIFFERENTIAL/PLATELET
BASOS ABS: 0 10*3/uL (ref 0.0–0.2)
Basos: 1 %
EOS (ABSOLUTE): 0.5 10*3/uL — AB (ref 0.0–0.4)
Eos: 6 %
Hematocrit: 34.9 % (ref 34.0–46.6)
Hemoglobin: 11.2 g/dL (ref 11.1–15.9)
Immature Grans (Abs): 0 10*3/uL (ref 0.0–0.1)
Immature Granulocytes: 0 %
LYMPHS ABS: 1 10*3/uL (ref 0.7–3.1)
Lymphs: 12 %
MCH: 32.9 pg (ref 26.6–33.0)
MCHC: 32.1 g/dL (ref 31.5–35.7)
MCV: 103 fL — ABNORMAL HIGH (ref 79–97)
MONOCYTES: 12 %
Monocytes Absolute: 1 10*3/uL — ABNORMAL HIGH (ref 0.1–0.9)
NEUTROS ABS: 5.7 10*3/uL (ref 1.4–7.0)
Neutrophils: 69 %
PLATELETS: 320 10*3/uL (ref 150–450)
RBC: 3.4 x10E6/uL — AB (ref 3.77–5.28)
RDW: 15.7 % — AB (ref 12.3–15.4)
WBC: 8.3 10*3/uL (ref 3.4–10.8)

## 2018-07-01 LAB — BMP8+EGFR
BUN / CREAT RATIO: 13 (ref 12–28)
BUN: 13 mg/dL (ref 8–27)
CALCIUM: 8.7 mg/dL (ref 8.7–10.3)
CO2: 28 mmol/L (ref 20–29)
Chloride: 99 mmol/L (ref 96–106)
Creatinine, Ser: 1.03 mg/dL — ABNORMAL HIGH (ref 0.57–1.00)
GFR calc non Af Amer: 50 mL/min/{1.73_m2} — ABNORMAL LOW (ref >59)
GFR, EST AFRICAN AMERICAN: 58 mL/min/{1.73_m2} — AB (ref >59)
GLUCOSE: 102 mg/dL — AB (ref 65–99)
POTASSIUM: 3.8 mmol/L (ref 3.5–5.2)
Sodium: 141 mmol/L (ref 134–144)

## 2018-07-02 NOTE — Progress Notes (Signed)
HPI The patient presents for evaluation of atrial fibrillation. She was cardioverted.  However, she developed recurrent atrial fib.  She had her flecainide does reduced because of a prolonged QT interval.  The flecainide was subsequently discontinued as she had reverted to atrial fib.   She has been followed in the atrial fib clinic.  She did describe chest pain and was sent for [facility] which did not suggest ischemia.  She does have a mildly reduced EF (40 - 45% on echo.) Her last echo in March 2019 demonstrated an improved EF of 55 - 60%.    She was started on amiodarone and underwent DCCV.  She called recently with epidsodes of weak spells.  She has been in and out of fib but she does not think that this correlates with her sensation.  She was told over the phone to reduce her pindolol.   Most recently she had increased dyspnea and decreased O2 sats.  She was treated for pneumonia.  She had a mildly elevated BNP level and was seen in our office earlier this month and referred to pulmonary.  A CT was done.  There was a thought that she might have an amiodarone pulmonary process.  Other processes could not be excluded such as a reaction to her methotrexate.  There was also suggestion of pulmonary edema.  She has been treated with diuresis.   She did have an increased creat but this is now improved.     Unfortunately she has not gotten any better clinically.  She still short of breath.  She has a dry nonproductive cough.  Her oxygen saturations fell recently when she was in Dr. Tawanna Sat office.  I reviewed his notes from yesterday.  She is had no new chest pressure, neck or arm discomfort.  He has had fatigue.  She is had no new weight gain or edema.   Allergies  Allergen Reactions  . Benazepril Hcl Cough    Current Outpatient Medications  Medication Sig Dispense Refill  . apixaban (ELIQUIS) 2.5 MG TABS tablet Take 2.5 mg by mouth 2 (two) times daily. As directed   Currently on hold    .  calcium-vitamin D (OSCAL WITH D) 250-125 MG-UNIT per tablet Take 1 tablet by mouth 2 (two) times daily.    Marland Kitchen diltiazem (CARDIZEM CD) 120 MG 24 hr capsule Take 1 capsule (120 mg total) by mouth daily. 90 capsule 3  . folic acid (FOLVITE) 1 MG tablet Take 1 mg by mouth daily.    . furosemide (LASIX) 20 MG tablet Take 1 tablet (20 mg total) by mouth daily. 180 tablet 1  . gabapentin (NEURONTIN) 100 MG capsule Take 100 mg by mouth daily as needed.    Marland Kitchen levothyroxine (SYNTHROID, LEVOTHROID) 75 MCG tablet TAKE 1 TABLET BY MOUTH  DAILY 90 tablet 3  . lisinopril (PRINIVIL,ZESTRIL) 40 MG tablet TAKE 1 TABLET BY MOUTH  DAILY 90 tablet 0  . Melatonin 5 MG TABS Take 5 mg by mouth at bedtime.    . Multiple Vitamin (MULTIVITAMIN) capsule Take 1 capsule by mouth daily.    . pantoprazole (PROTONIX) 40 MG tablet Take 1 tablet (40 mg total) by mouth daily. 30 tablet 11  . methotrexate (RHEUMATREX) 10 MG tablet Take 10 mg by mouth once a week. Caution: Chemotherapy. Protect from light.     No current facility-administered medications for this visit.     Past Medical History:  Diagnosis Date  . Arthritis   .  Bradycardia 2014  . Calcification of bronchial airway 06/23/2018   01/2017 - See on CT imaging of the chest   . Cataract   . Cerebral vascular disease 06/19/2018   06/19/2018 - MRI with chronic microvascular ischemic change  . Diverticulosis   . Dyspnea    periodically  . Dysrhythmia    a - fib  . Heart murmur    ??? bruit  . History of stress test    a. 04/09/13-nuclear stress-no ischemia low risk  . Hypertension   . Menopause   . Osteoporosis   . PAF (paroxysmal atrial fibrillation) ([place])    a. s/p prior DCCV; b. On flecainide & coumadin (CHA2DS2VASc = 4);  c. 03/2016 Echo: EF 55-60%, mod LVH, mod AI, mild to mod TR, PASP 37mmHg.  Marland Kitchen Pelvic fracture [facility])     Past Surgical History:  Procedure Laterality Date  . BIOPSY BREAST    . BIOPSY BREAST     right & benign  . BREAST SURGERY    .  CARDIOVERSION N/A 10/30/2016   Procedure: CARDIOVERSION;  Surgeon: Larey Dresser, MD;  Location: [facility];  Service: Cardiovascular;  Laterality: N/A;  . CARDIOVERSION N/A 08/20/2017   Procedure: CARDIOVERSION;  Surgeon: Jerline Pain, MD;  Location[facility] ENDOSCOPY;  Service: Cardiovascular;  Laterality: N/A;  . CATARACT EXTRACTION    . EYE SURGERY     cataracts  . TEE WITHOUT CARDIOVERSION N/A 10/30/2016   Procedure: TRANSESOPHAGEAL ECHOCARDIOGRAM (TEE);  Surgeon: Larey Dresser, MD;  Location: [facility];  Service: Cardiovascular;  Laterality: N/A;  . TOTAL HIP ARTHROPLASTY Right 01/27/2017   Procedure: TOTAL HIP ARTHROPLASTY ANTERIOR APPROACH;  Surgeon: Rod Can, MD;  Location: [place] [facility];  Service: Orthopedics;  Laterality: Right;    ROS  As stated in the HPI and negative for all other systems.   PHYSICAL EXAM BP (!) 160/76 (BP Location: Left Arm, Patient Position: Sitting, Cuff Size: Normal)   Pulse 77   Ht 5' (1.524 m)   Wt 121 lb (54.9 kg)   BMI 23.63 kg/m   GENERAL:  Well appearing NECK:  No jugular venous distention, waveform within normal limits, carotid upstroke brisk and symmetric, no bruits, no thyromegaly LUNGS:  Clear to auscultation bilaterally CHEST:  Diffuse crackles HEART:  PMI not displaced or sustained,S1 and S2 within normal limits, no S3, no S4, no clicks, no rubs, no murmurs ABD:  Flat, positive bowel sounds normal in frequency in pitch, no bruits, no rebound, no guarding, no midline pulsatile mass, no hepatomegaly, no splenomegaly EXT:  2 plus pulses throughout, no edema, no cyanosis no clubbing    EKG:  NSR   Lab Results  Component Value Date   TSH 0.689 06/08/2018   ALT 80 (H) 06/08/2018   AST 78 (H) 06/08/2018   ALKPHOS 63 06/08/2018   BILITOT 0.4 06/08/2018   PROT 5.5 (L) 06/08/2018   ALBUMIN 3.0 (L) 06/08/2018     ASSESSMENT AND PLAN  HTN:    I repeated the blood pressure and did come down during this appointment but is slightly  elevated.  However, I reviewed a blood pressure diary and I would not suggest any changes.   ATRIAL FIB:   [name] has a CHA2DS2 - VASc score of 4 with a risk of stroke of 4%.    She is off of the amiodarone.    No change in therapy.    DYSPNEA:    It is unclear what the lung process  is.  I do not think his heart failure but I am going to give her 2 days of 40 mg twice a day of Lasix.  She will get a basic metabolic profile when she comes back.  We could consider a right heart catheterization but I will defer to her pulmonologist.  She remains off of the methotrexate and amiodarone.  She will be getting a 6-minute walk test and then follow-up.  She will likely get a course of steroids.

## 2018-07-03 ENCOUNTER — Ambulatory Visit: Payer: Medicare Other | Admitting: Cardiology

## 2018-07-03 ENCOUNTER — Encounter: Payer: Self-pay | Admitting: Cardiology

## 2018-07-03 VITALS — BP 160/76 | HR 77 | Ht 60.0 in | Wt 121.0 lb

## 2018-07-03 DIAGNOSIS — J849 Interstitial pulmonary disease, unspecified: Secondary | ICD-10-CM

## 2018-07-03 DIAGNOSIS — Z79899 Other long term (current) drug therapy: Secondary | ICD-10-CM | POA: Diagnosis not present

## 2018-07-03 DIAGNOSIS — R0609 Other forms of dyspnea: Secondary | ICD-10-CM | POA: Diagnosis not present

## 2018-07-03 NOTE — Patient Instructions (Signed)
Medication Instructions:  INCREASE- Lasix 40 mg twice a day for 2 days then back to regular dose  If you need a refill on your cardiac medications before your next appointment, please call your pharmacy.  Labwork: BMP HERE IN OUR OFFICE AT [facility]  Take the provided lab slips with you to the lab for your blood draw.   You will NOT need to fast   Testing/Procedures: None Ordered   Follow-Up: Your physician wants you to follow-up in: 3 Months with Dr Percival Spanish. Y     Thank you for choosing [facility] at [facility]!!

## 2018-07-06 ENCOUNTER — Telehealth: Payer: Self-pay | Admitting: Family Medicine

## 2018-07-06 ENCOUNTER — Other Ambulatory Visit: Payer: Self-pay | Admitting: *Deleted

## 2018-07-06 DIAGNOSIS — Z1211 Encounter for screening for malignant neoplasm of colon: Secondary | ICD-10-CM

## 2018-07-06 NOTE — Telephone Encounter (Signed)
Pt would like to do another FOBT since re-start of blood thinner.   Placed up front - pt aware

## 2018-07-07 ENCOUNTER — Ambulatory Visit (INDEPENDENT_AMBULATORY_CARE_PROVIDER_SITE_OTHER): Payer: Medicare Other | Admitting: Pulmonary Disease

## 2018-07-07 DIAGNOSIS — R0602 Shortness of breath: Secondary | ICD-10-CM | POA: Diagnosis not present

## 2018-07-07 NOTE — Progress Notes (Addendum)
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  Subjective:   PATIENT ID: Susan Fitzgerald GENDER: female DOB: 17-May-1932, MRN: [id number]  Chief Complaint  Patient presents with  . Follow-up    shortness of breath. breathing is a little labored     Patient has a history of AFIB, placed on flecainide developed prolonged QTc and was stopped, prior mildly reduced EF 40-45% however most recent improved 55-60%, HTN, RA on MTX, 01/2016 +RF, +Anti-CCP, both low level with elevated ESR.   Over the past year has notice her SOB. However of the past several months her SOB has been limiting her activity. She was an avid golfer and has recently been unable due to her SOB. She was started on prednisone for costochondritis back in June. During that time while she was on prednisone she felt much better. Started on amiodarone around 2 years ago currently taking 232m daily, Higher loading dose at start.  Today, reviewed records from 05/25/2018, Dr. BAmil Amen rheumatology.  Patient had mild RF positive, positive anti-CCP, hand synovitis on exam, hand x-rays May 2017 with right ulnar styloid and early erosions of the right wrist and the second MCP.  She was treated for some time with methotrexate once weekly.  This has been stopped currently after having recent elevation in serum creatinine.  07/08/2018 -since she was last seen in the office we obtained a high-resolution CT scan.  Per radiology there was evidence of congestive heart failure.  She was since followed up with cardiology and they increased her diuretic regimen.  She was started on oxygen therapy after her last visit.  She is feeling better with exertion.  She denies fevers, chills.   Past Medical History:  Diagnosis Date  . Arthritis   . Bradycardia 2014  . Calcification of bronchial airway 06/23/2018   01/2017 - See on CT imaging of the chest   . Cataract   . Cerebral vascular disease 06/19/2018     06/19/2018 - MRI with chronic microvascular ischemic change  . Diverticulosis   . Dyspnea    periodically  . Dysrhythmia    a - fib  . Heart murmur    ??? bruit  . History of stress test    a. 04/09/13-nuclear stress-no ischemia low risk  . Hypertension   . Menopause   . Osteoporosis   . PAF (paroxysmal atrial fibrillation) ([facility]    a. s/p prior DCCV; b. On flecainide & coumadin (CHA2DS2VASc = 4);  c. 03/2016 Echo: EF 55-60%, mod LVH, mod AI, mild to mod TR, PASP 544mg.  . Marland Kitchenelvic fracture (HCC)      Family History  Problem Relation Age of Onset  . Stroke Mother        cerebral hemorrhage  . Heart disease Father        MI  . Heart attack Father   . Vision loss Father   . Heart disease Brother   . Heart attack Brother   . Cancer Maternal Aunt        breast  . Cancer Brother   . Heart disease Brother   . Cancer Daughter        breast     Social History   Socioeconomic History  . Marital status: Married    Spouse name: Not on file  . Number of children: Not on file  . Years of education: Not on file  . Highest education level: Not on file  Occupational History  . Not on file  Social Needs  . Financial resource strain: Not on file  . Food insecurity:    Worry: Not on file    Inability: Not on file  . Transportation needs:    Medical: Not on file    Non-medical: Not on file  Tobacco Use  . Smoking status: Never Smoker  . Smokeless tobacco: Never Used  Substance and Sexual Activity  . Alcohol use: No  . Drug use: No  . Sexual activity: Yes  Lifestyle  . Physical activity:    Days per week: Not on file    Minutes per session: Not on file  . Stress: Not on file  Relationships  . Social connections:    Talks on phone: Not on file    Gets together: Not on file    Attends religious service: Not on file    Active member of club or organization: Not on file    Attends meetings of clubs or organizations: Not on file    Relationship status: Not on file  .  Intimate partner violence:    Fear of current or ex partner: Not on file    Emotionally abused: Not on file    Physically abused: Not on file    Forced sexual activity: Not on file  Other Topics Concern  . Not on file  Social History Narrative  . Not on file     Allergies  Allergen Reactions  . Benazepril Hcl Cough     Outpatient Medications Prior to Visit  Medication Sig Dispense Refill  . apixaban (ELIQUIS) 2.5 MG TABS tablet Take 2.5 mg by mouth 2 (two) times daily. As directed   Currently on hold    . calcium-vitamin D (OSCAL WITH D) 250-125 MG-UNIT per tablet Take 1 tablet by mouth 2 (two) times daily.    Marland Kitchen diltiazem (CARDIZEM CD) 120 MG 24 hr capsule Take 1 capsule (120 mg total) by mouth daily. 90 capsule 3  . folic acid (FOLVITE) 1 MG tablet Take 1 mg by mouth daily.    . furosemide (LASIX) 20 MG tablet Take 1 tablet (20 mg total) by mouth daily. 180 tablet 1  . gabapentin (NEURONTIN) 100 MG capsule Take 100 mg by mouth daily as needed.    Marland Kitchen levothyroxine (SYNTHROID, LEVOTHROID) 75 MCG tablet TAKE 1 TABLET BY MOUTH  DAILY 90 tablet 3  . lisinopril (PRINIVIL,ZESTRIL) 40 MG tablet TAKE 1 TABLET BY MOUTH  DAILY 90 tablet 0  . Melatonin 5 MG TABS Take 5 mg by mouth at bedtime.    . methotrexate (RHEUMATREX) 10 MG tablet Take 10 mg by mouth once a week. Caution: Chemotherapy. Protect from light.    . Multiple Vitamin (MULTIVITAMIN) capsule Take 1 capsule by mouth daily.    . pantoprazole (PROTONIX) 40 MG tablet Take 1 tablet (40 mg total) by mouth daily. 30 tablet 11   No facility-administered medications prior to visit.     Review of Systems  Constitutional: Positive for malaise/fatigue. Negative for fever and weight loss.  HENT: Positive for sore throat. Negative for congestion, ear pain and nosebleeds.   Eyes: Negative for redness.  Respiratory: Positive for shortness of breath. Negative for cough and wheezing.   Cardiovascular: Negative for palpitations, leg swelling and  PND.  Gastrointestinal: Negative for nausea and vomiting.  Genitourinary: Negative for dysuria.  Musculoskeletal: Negative for joint pain.  Skin: Negative for rash.  Neurological: Negative for headaches.  Endo/Heme/Allergies: Does not bruise/bleed easily.  Psychiatric/Behavioral: Negative for depression. The patient is not nervous/anxious.     Objective:  Physical Exam  Constitutional: She is oriented to person, place, and time. She appears well-developed and well-nourished. No distress.  HENT:  Head: Normocephalic and atraumatic.  Mouth/Throat: Oropharynx is clear and moist.  Eyes: Pupils are equal, round, and reactive to light. Conjunctivae are normal. No scleral icterus.  Neck: Neck supple. No JVD present. No tracheal deviation present.  Cardiovascular: Normal rate and intact distal pulses. An irregularly irregular rhythm present.  Murmur (systolic murmur LLSB ) heard. Pulmonary/Chest: Effort normal and breath sounds normal. No accessory muscle usage or stridor. No tachypnea. No respiratory distress. She has no wheezes. She has no rhonchi. She has no rales.  Bibasilar inspiratory crackles.   Abdominal: Soft. Bowel sounds are normal. She exhibits no distension. There is no tenderness.  Musculoskeletal: She exhibits no edema or tenderness.  Lymphadenopathy:    She has no cervical adenopathy.  Neurological: She is alert and oriented to person, place, and time.  Skin: Skin is warm and dry. Capillary refill takes less than 2 seconds. No rash noted.  Psychiatric: She has a normal mood and affect. Her behavior is normal.  Vitals reviewed.   Vitals:   07/08/18 0948  BP: 128/80  Pulse: 75  SpO2: 94%  Weight: 119 lb 9.6 oz (54.3 kg)  Height: '5\' 1"'  (1.549 m)   94% on 2 L nasal cannula  BMI Readings from Last 3 Encounters:  07/08/18 22.60 kg/m  07/03/18 23.63 kg/m  06/30/18 23.63 kg/m   Wt Readings from Last 3 Encounters:  07/08/18 119 lb 9.6 oz (54.3 kg)  07/03/18 121 lb  (54.9 kg)  06/30/18 121 lb (54.9 kg)    CBC    Component Value Date/Time   WBC 8.3 06/30/2018 1236   WBC 4.8 08/15/2017 1206   RBC 3.40 (L) 06/30/2018 1236   RBC 4.05 08/15/2017 1206   HGB 11.2 06/30/2018 1236   HCT 34.9 06/30/2018 1236   PLT 320 06/30/2018 1236   MCV 103 (H) 06/30/2018 1236   MCH 32.9 06/30/2018 1236   MCH 30.6 08/15/2017 1206   MCHC 32.1 06/30/2018 1236   MCHC 32.0 08/15/2017 1206   RDW 15.7 (H) 06/30/2018 1236   LYMPHSABS 1.0 06/30/2018 1236   MONOABS 0.8 02/10/2017 0332   EOSABS 0.5 (H) 06/30/2018 1236   BASOSABS 0.0 06/30/2018 1236   Lab Results  Component Value Date   ALT 80 (H) 06/08/2018   AST 78 (H) 06/08/2018   ALKPHOS 63 06/08/2018   BILITOT 0.4 06/08/2018   Lab Results  Component Value Date   ESRSEDRATE 18 06/16/2018   Lab Results  Component Value Date   CREATININE 1.03 (H) 06/30/2018    Chest Imaging: CT imaging from 2017 was reviewed with basilar intersitial changes (my read)  CXR from 05/2018 reviewed with bilateral prominent interstitial markings. (my read)    High-resolution CT from August 2019 Evidence of interlobular septal thickening and patchy ground glass opacities.  There is evidence in the upper lobes as well as the lower lobes however there is small amounts of bilateral pleural fluid.  This could represent pulmonary edema and decompensation of her known heart failure.  However the upper lobe predominance and is also seen more in the right than the left.  This could represent drug-induced disease. The patient's images have been independently reviewed by me.    Pulmonary Functions Testing Results:  07/07/2018 TLC - 56%  6MWT: Distance 288 meters, 2LNC  FeNO: None   Pathology: None   Echocardiogram:  01/27/2018 Study Conclusions  - Left ventricle: The cavity size was normal. Wall thickness was   increased in a pattern of mild LVH. There was mild focal basal   hypertrophy of the septum. Systolic function was  normal. The   estimated ejection fraction was in the range of 55% to 60%. Wall   motion was normal; there were no regional wall motion   abnormalities. Doppler parameters are consistent with abnormal   left ventricular relaxation (grade 1 diastolic dysfunction). - Aortic valve: There was mild regurgitation. - Ascending aorta: The ascending aorta was mildly dilated. - Mitral valve: Calcified annulus. - Pulmonary arteries: Systolic pressure was mildly increased.  Impressions:  - Normal LV systolic function; mild diastolic dysfunction; mild   LVH; calcified aortic valve with mild AI; mildly dilated   ascending aorta; mild TR; mild pulmonary hypertension.  Heart Catheterization: none     Assessment & Plan:   Shortness of breath  Acute hypoxemic respiratory failure (HCC) - Plan: predniSONE (DELTASONE) 10 MG tablet  Diastolic dysfunction with chronic heart failure (HCC)  Right ventricular enlargement  Discussion: This 84 year old lady with possible underlying drug-induced lung injury, drug-induced interstitial lung disease.  She has been seen by her cardiologist which feels her heart failure is well managed.  She has taken additional doses of diuretic.    The differential remains broad with her history of MTX use (now stopped), history of RA prior positive anti-CCP as well as prolonged use of amiodarone.  She does not have infectious symptoms at this time.  Her recent PFTs revealed the inability to complete spirometry or DLCO due to difficulty breathing and cooperation with the test.  Lung lines are completed with nitrogen washout which revealed a moderate restrictive defect.  At some point we could consider pulmonary hypertension as being a underlying etiology of her dyspnea.  Would like to obtain DLCO.  Plan:  We will start 40 mg of prednisone daily for the next 2 weeks and decrease to 20 mg daily until she sees me again in the clinic.  Patient was counseled on risks versus  benefits versus alternatives of treating empirically for a drug-induced lung disease such as amiodarone lung toxicity.  We will continue to observe for any infectious symptoms while being placed on prednisone.  I suspect all of her rheumatologic symptoms from her RA will improve being on oral prednisone.  She was counseled on the increase water retention and hyperglycemia associated with being on prednisone.  She was encouraged to monitor her weights daily and let us know if she is gaining weight as she may benefit from additional diuresis.  We will look to see if KL-6 levels which have been used to help identify amiodarone induced lung toxicity is a test that is now commercially available.  I explained that completing a bronchoscopy would be useful in ruling out underlying infectious etiology.  However would suspect cytopathology of a BAL specimen only to show foamy macrophages which would be consistent with her prior history of exposure to amiodarone and not necessarily diagnostic for the disease.  CT imaging as well as lung volumes or bony function tests were reviewed with patient the office today.  Return to clinic in 4 weeks to see me.   Current Outpatient Medications:  .  apixaban (ELIQUIS) 2.5 MG TABS tablet, Take 2.5 mg by mouth 2 (two) times daily. As directed   Currently on hold, Disp: , Rfl:  .  calcium-vitamin D (OSCAL WITH D) 250-125 MG-UNIT per tablet, Take 1 tablet by mouth 2 (two) times daily., Disp: , Rfl:  .  diltiazem (CARDIZEM CD) 120 MG 24 hr capsule, Take 1 capsule (120 mg total) by mouth daily., Disp: 90 capsule, Rfl: 3 .  folic acid (FOLVITE) 1 MG tablet, Take 1 mg by mouth daily., Disp: , Rfl:  .  furosemide (LASIX) 20 MG tablet, Take 1 tablet (20 mg total) by mouth daily., Disp: 180 tablet, Rfl: 1 .  gabapentin (NEURONTIN) 100 MG capsule, Take 100 mg by mouth daily as needed., Disp: , Rfl:  .  levothyroxine (SYNTHROID, LEVOTHROID) 75 MCG tablet, TAKE 1 TABLET BY MOUTH   DAILY, Disp: 90 tablet, Rfl: 3 .  lisinopril (PRINIVIL,ZESTRIL) 40 MG tablet, TAKE 1 TABLET BY MOUTH  DAILY, Disp: 90 tablet, Rfl: 0 .  Melatonin 5 MG TABS, Take 5 mg by mouth at bedtime., Disp: , Rfl:  .  methotrexate (RHEUMATREX) 10 MG tablet, Take 10 mg by mouth once a week. Caution: Chemotherapy. Protect from light., Disp: , Rfl:  .  Multiple Vitamin (MULTIVITAMIN) capsule, Take 1 capsule by mouth daily., Disp: , Rfl:  .  predniSONE (DELTASONE) 10 MG tablet, Starting 07/09/2018 take 4 tablets (28m) x 2 weeks then starting September 4th take 2 tablets (219m and hold until next office visit., Disp: 84 tablet, Rfl: 0   BrGarner NashDO [facility] 07/08/2018 10:30 AM

## 2018-07-07 NOTE — Progress Notes (Signed)
PFT performed today. 

## 2018-07-08 ENCOUNTER — Ambulatory Visit (INDEPENDENT_AMBULATORY_CARE_PROVIDER_SITE_OTHER): Payer: Medicare Other | Admitting: *Deleted

## 2018-07-08 ENCOUNTER — Other Ambulatory Visit: Payer: Medicare Other

## 2018-07-08 ENCOUNTER — Ambulatory Visit: Payer: Medicare Other | Admitting: Pulmonary Disease

## 2018-07-08 ENCOUNTER — Encounter: Payer: Self-pay | Admitting: Pulmonary Disease

## 2018-07-08 VITALS — BP 128/80 | HR 75 | Ht 61.0 in | Wt 119.6 lb

## 2018-07-08 DIAGNOSIS — J849 Interstitial pulmonary disease, unspecified: Secondary | ICD-10-CM

## 2018-07-08 DIAGNOSIS — I517 Cardiomegaly: Secondary | ICD-10-CM

## 2018-07-08 DIAGNOSIS — I5032 Chronic diastolic (congestive) heart failure: Secondary | ICD-10-CM

## 2018-07-08 DIAGNOSIS — R0602 Shortness of breath: Secondary | ICD-10-CM | POA: Diagnosis not present

## 2018-07-08 DIAGNOSIS — J9601 Acute respiratory failure with hypoxia: Secondary | ICD-10-CM | POA: Diagnosis not present

## 2018-07-08 DIAGNOSIS — Z1211 Encounter for screening for malignant neoplasm of colon: Secondary | ICD-10-CM

## 2018-07-08 MED ORDER — PREDNISONE 10 MG PO TABS: ORAL_TABLET | ORAL | 0 refills | Status: DC

## 2018-07-08 NOTE — Progress Notes (Signed)
SIX MIN WALK 07/08/2018  Medications Eliquis, Calcium, Diltiazem, Folic Acid, Levothyroxine, Lisinopril, MVI, Pindolol  Supplimental Oxygen during Test? (L/min) Yes  O2 Flow Rate 2  Type Continuous  Laps 6  Partial Lap (in Meters) 0  Baseline BP (sitting) 132/70  Baseline Heartrate 72  Baseline Dyspnea (Borg Scale) 7  Baseline Fatigue (Borg Scale) 8  Baseline SPO2 98  BP (sitting) 150/70  Heartrate 96  Dyspnea (Borg Scale) 9  Fatigue (Borg Scale) 8  SPO2 92  BP (sitting) 144/70  Heartrate 76  SPO2 97  Stopped or Paused before Six Minutes No  Interpretation Dizziness  Distance Completed 288

## 2018-07-08 NOTE — Patient Instructions (Signed)
Start prednisone 40mg  daily for 2 weeks and drop to 20mg  daily. Stay at 20mg  until you see me again in the office.

## 2018-07-13 LAB — FECAL OCCULT BLOOD, IMMUNOCHEMICAL: FECAL OCCULT BLD: NEGATIVE

## 2018-07-14 ENCOUNTER — Telehealth: Payer: Self-pay | Admitting: Cardiology

## 2018-07-14 NOTE — Telephone Encounter (Signed)
Patient says that she has already spoke with a nurse about her question.

## 2018-07-14 NOTE — Telephone Encounter (Signed)
New Message   Pt states that she is calling to speak with the nurse about an important issue but does not want to disclose what its about. Please call

## 2018-07-14 NOTE — Telephone Encounter (Signed)
Spoke with pt.  Pt sts that she resumed Eliquis on 8/7/1p as instructed by Dr.Hochrein. Pt sts that she has had a repeated fecal occult blood test on 8/21 that was negative.  Pt has a couple of updates and questions for Dr.Hochrein 1) She had a negative occult on 8/21 2) Does Dr.Hochrein feel that she needs to proceed with the scheduled colonoscopy ?(she doesn't really want to have it) 3) Does she need to repeat the fecal occult at some point ? 4) She has been started on O2 at home (2L) and Prednisone  By her pulmonologist. 5) she has been more active. Her Bp and HR have been ok.  Adv pt that I will fwd the message to Dr.Hochrein and we will call her back with his response.  Pt agreeable with plan and voiced appreciation for the call back

## 2018-07-16 DIAGNOSIS — I10 Essential (primary) hypertension: Secondary | ICD-10-CM | POA: Diagnosis not present

## 2018-07-16 DIAGNOSIS — S22080G Wedge compression fracture of T11-T12 vertebra, subsequent encounter for fracture with delayed healing: Secondary | ICD-10-CM | POA: Diagnosis not present

## 2018-07-16 NOTE — Telephone Encounter (Signed)
I suggested that she could delay the colonoscopy.  Called to answer her questions.

## 2018-07-22 ENCOUNTER — Telehealth: Payer: Self-pay | Admitting: Pulmonary Disease

## 2018-07-22 NOTE — Telephone Encounter (Signed)
Called and spoke to pt.  Pt states she developed some facial swelling yesterday. Pt denies any additional symptoms. Pt stated she has not eaten any new foods or taken any new medications.  Pt did state that at last OV, she was instructed to increase prednisone 40mg  x2 weeks then decrease to 20mg .  Pt is concerned that swelling may be related to prednisone. Pt stated she took first dose of prednisone 20mg  this morning.  I have offered an apt, pt is requesting recommendations prior to scheduling OV.  BQ please advise, as Dr. Valeta Harms is unavailable. Thanks

## 2018-07-22 NOTE — Telephone Encounter (Signed)
Spoke with patient. She verbalized understanding. She stated that she is not currently having issues with swallowing, talking or breathing. Just the puffiness in her face. She also stated that she will continue to decrease the prednisone by 20mg  and will call back if her symptoms get worse.   Nothing further needed at time of call.

## 2018-07-22 NOTE — Telephone Encounter (Signed)
Please ensure no trouble breathing, swallowing or talking. If isolated swelling without these symptoms then this is probably directly related to the prednisone and I would agree with decreasing to 20mg  daily.

## 2018-07-24 DIAGNOSIS — J849 Interstitial pulmonary disease, unspecified: Secondary | ICD-10-CM | POA: Diagnosis not present

## 2018-07-26 ENCOUNTER — Emergency Department (HOSPITAL_COMMUNITY): Payer: Medicare Other

## 2018-07-26 ENCOUNTER — Emergency Department (HOSPITAL_COMMUNITY)
Admission: EM | Admit: 2018-07-26 | Discharge: 2018-07-26 | Disposition: A | Discharge status: Home / Self Care | Payer: Medicare Other | Attending: Emergency Medicine | Admitting: Emergency Medicine

## 2018-07-26 ENCOUNTER — Encounter (HOSPITAL_COMMUNITY): Payer: Self-pay

## 2018-07-26 DIAGNOSIS — Y929 Unspecified place or not applicable: Secondary | ICD-10-CM | POA: Diagnosis not present

## 2018-07-26 DIAGNOSIS — E039 Hypothyroidism, unspecified: Secondary | ICD-10-CM | POA: Diagnosis not present

## 2018-07-26 DIAGNOSIS — S0081XA Abrasion of other part of head, initial encounter: Secondary | ICD-10-CM | POA: Diagnosis not present

## 2018-07-26 DIAGNOSIS — S0083XA Contusion of other part of head, initial encounter: Secondary | ICD-10-CM | POA: Diagnosis not present

## 2018-07-26 DIAGNOSIS — Z79899 Other long term (current) drug therapy: Secondary | ICD-10-CM | POA: Diagnosis not present

## 2018-07-26 DIAGNOSIS — W0110XA Fall on same level from slipping, tripping and stumbling with subsequent striking against unspecified object, initial encounter: Secondary | ICD-10-CM | POA: Diagnosis not present

## 2018-07-26 DIAGNOSIS — S8012XA Contusion of left lower leg, initial encounter: Secondary | ICD-10-CM | POA: Insufficient documentation

## 2018-07-26 DIAGNOSIS — W19XXXA Unspecified fall, initial encounter: Secondary | ICD-10-CM

## 2018-07-26 DIAGNOSIS — I10 Essential (primary) hypertension: Secondary | ICD-10-CM | POA: Diagnosis not present

## 2018-07-26 DIAGNOSIS — Y999 Unspecified external cause status: Secondary | ICD-10-CM | POA: Insufficient documentation

## 2018-07-26 DIAGNOSIS — Z7901 Long term (current) use of anticoagulants: Secondary | ICD-10-CM | POA: Insufficient documentation

## 2018-07-26 DIAGNOSIS — Y939 Activity, unspecified: Secondary | ICD-10-CM | POA: Diagnosis not present

## 2018-07-26 DIAGNOSIS — S0990XA Unspecified injury of head, initial encounter: Secondary | ICD-10-CM | POA: Diagnosis not present

## 2018-07-26 DIAGNOSIS — S81812A Laceration without foreign body, left lower leg, initial encounter: Secondary | ICD-10-CM | POA: Diagnosis not present

## 2018-07-26 DIAGNOSIS — S199XXA Unspecified injury of neck, initial encounter: Secondary | ICD-10-CM | POA: Diagnosis not present

## 2018-07-26 MED ORDER — POVIDONE-IODINE 10 % EX SOLN
CUTANEOUS | Status: AC
  Administered 2018-07-26: 1
  Filled 2018-07-26: qty 15

## 2018-07-26 NOTE — ED Provider Notes (Signed)
[facility] EMERGENCY DEPARTMENT Provider Note   CSN: [id number] Arrival date & time: 07/26/18  1814     History   Chief Complaint Chief Complaint  Patient presents with  . Fall    HPI Susan Fitzgerald is a 84 y.o. female.  Status post accidental mechanical fall a brief time ago striking her face and left tibia.  No loss of consciousness or neurological deficits.  Complains of left facial contusion, skin tear on the anterior aspect of the left distal tibia.  Severity of pain is moderate.  Palpation makes pain worse.     Past Medical History:  Diagnosis Date  . Arthritis   . Bradycardia 2014  . Calcification of bronchial airway 06/23/2018   01/2017 - See on CT imaging of the chest   . Cataract   . Cerebral vascular disease 06/19/2018   06/19/2018 - MRI with chronic microvascular ischemic change  . Diverticulosis   . Dyspnea    periodically  . Dysrhythmia    a - fib  . Heart murmur    ??? bruit  . History of stress test    a. 04/09/13-nuclear stress-no ischemia low risk  . Hypertension   . Menopause   . Osteoporosis   . PAF (paroxysmal atrial fibrillation) ([place])    a. s/p prior DCCV; b. On flecainide & coumadin (CHA2DS2VASc = 4);  c. 03/2016 Echo: EF 55-60%, mod LVH, mod AI, mild to mod TR, PASP 61mmHg.  Marland Kitchen Pelvic fracture [facility])     Patient Active Problem List   Diagnosis Date Noted  . Calcification of bronchial airway 06/23/2018  . Diastolic dysfunction with chronic heart failure ([place]) 06/23/2018  . Right ventricular enlargement 06/23/2018  . Interstitial pulmonary disease ([place]) 06/23/2018  . Elevated liver enzymes 06/23/2018  . Cerebral vascular disease 06/19/2018  . New onset of confusion 06/08/2018  . Fever 06/08/2018  . Weakness 02/25/2018  . Hypothyroidism 09/18/2017  . Shortness of breath 07/13/2017  . Spinal stenosis of lumbar region with neurogenic claudication 03/20/2017  . Avascular necrosis of hip, right ([place]) 01/27/2017  . Avascular necrosis of bone of  right hip ([place]) 01/27/2017  . Abdominal aortic atherosclerosis ([place]) 09/24/2016  . Rheumatoid arthritis ([place]) 05/07/2016  . Symptomatic bradycardia 06/02/2013  . Hypertension 04/21/2013  . Osteoporosis 04/21/2013  . Atrial fibrillation, chronic ([place]) 02/09/2013    Past Surgical History:  Procedure Laterality Date  . BIOPSY BREAST    . BIOPSY BREAST     right & benign  . BREAST SURGERY    . CARDIOVERSION N/A 10/30/2016   Procedure: CARDIOVERSION;  Surgeon: Larey Dresser, MD;  Location: [place];  Service: Cardiovascular;  Laterality: N/A;  . CARDIOVERSION N/A 08/20/2017   Procedure: CARDIOVERSION;  Surgeon: Jerline Pain, MD;  Location: [facility] ENDOSCOPY;  Service: Cardiovascular;  Laterality: N/A;  . CATARACT EXTRACTION    . EYE SURGERY     cataracts  . TEE WITHOUT CARDIOVERSION N/A 10/30/2016   Procedure: TRANSESOPHAGEAL ECHOCARDIOGRAM (TEE);  Surgeon: Larey Dresser, MD;  Location: [place];  Service: Cardiovascular;  Laterality: N/A;  . TOTAL HIP ARTHROPLASTY Right 01/27/2017   Procedure: TOTAL HIP ARTHROPLASTY ANTERIOR APPROACH;  Surgeon: Rod Can, MD;  Location: [place];  Service: Orthopedics;  Laterality: Right;     OB History    Gravida  2   Para  2   Term  2   Preterm      AB      Living  2  SAB      TAB      Ectopic      Multiple      Live Births               Home Medications    Prior to Admission medications   Medication Sig Start Date End Date Taking? Authorizing Provider  apixaban (ELIQUIS) 2.5 MG TABS tablet Take 2.5 mg by mouth 2 (two) times daily. As directed   Currently on hold   Yes [provider]  calcium-vitamin D (OSCAL WITH D) 250-125 MG-UNIT per tablet Take 1 tablet by mouth 2 (two) times daily.   Yes [provider]  diltiazem (CARDIZEM CD) 120 MG 24 hr capsule Take 1 capsule (120 mg total) by mouth daily. 01/02/18  Yes Sherran Needs, NP  folic acid (FOLVITE) 1 MG tablet Take 2 mg by mouth daily.     Yes [provider]  furosemide (LASIX) 20 MG tablet Take 1 tablet (20 mg total) by mouth daily. 06/25/18  Yes Minus Breeding, MD  gabapentin (NEURONTIN) 100 MG capsule Take 100 mg by mouth at bedtime.    Yes [provider]  levothyroxine (SYNTHROID, LEVOTHROID) 75 MCG tablet TAKE 1 TABLET BY MOUTH  DAILY 04/06/18  Yes Chipper Herb, MD  lisinopril (PRINIVIL,ZESTRIL) 40 MG tablet TAKE 1 TABLET BY MOUTH  DAILY 05/22/18  Yes Chipper Herb, MD  Melatonin 5 MG TABS Take 5 mg by mouth at bedtime.   Yes [provider]  Multiple Vitamin (MULTIVITAMIN) capsule Take 1 capsule by mouth daily.   Yes [provider]  predniSONE (DELTASONE) 10 MG tablet Starting 07/09/2018 take 4 tablets (40mg ) x 2 weeks then starting September 4th take 2 tablets (20mg ) and hold until next office visit. 07/08/18   Icard, Octavio Graves, DO  pindolol (VISKEN) 10 MG tablet Take 1 tablet (10 mg total) by mouth 2 (two) times daily. 03/10/18 06/30/18  Minus Breeding, MD    Family History Family History  Problem Relation Age of Onset  . Stroke Mother        cerebral hemorrhage  . Heart disease Father        MI  . Heart attack Father   . Vision loss Father   . Heart disease Brother   . Heart attack Brother   . Cancer Maternal Aunt        breast  . Cancer Brother   . Heart disease Brother   . Cancer Daughter        breast    Social History Social History   Tobacco Use  . Smoking status: Never Smoker  . Smokeless tobacco: Never Used  Substance Use Topics  . Alcohol use: No  . Drug use: No     Allergies   Benazepril hcl   Review of Systems Review of Systems  All other systems reviewed and are negative.    Physical Exam Updated Vital Signs BP (!) 226/81   Pulse 67   Temp 98.2 F (36.8 C) (Oral)   Resp 18   Wt 54.9 kg   SpO2 96%   BMI 22.86 kg/m   Physical Exam  Constitutional: She is oriented to person, place, and time. She appears well-developed and  well-nourished.  HENT:  Head: Normocephalic.  Abrasion/contusion left cheek  Eyes: Conjunctivae are normal.  Neck: Neck supple.  Cardiovascular: Normal rate and regular rhythm.  Pulmonary/Chest: Effort normal and breath sounds normal.  Abdominal: Soft. Bowel sounds are normal.  Musculoskeletal: Normal range of motion.  Neurological: She is alert and oriented to person, place, and time.  Skin:  Skin tear left distal anterior tibia  Psychiatric: She has a normal mood and affect. Her behavior is normal.  Nursing note and vitals reviewed.    ED Treatments / Results  Labs (all labs ordered are listed, but only abnormal results are displayed) Labs Reviewed - No data to display  EKG None  Radiology Dg Tibia/fibula Left  Result Date: 07/26/2018 CLINICAL DATA:  Fall. Left lower leg bruising and skin tear. Initial encounter. EXAM: LEFT TIBIA AND FIBULA - 2 VIEW COMPARISON:  Left knee radiographs 06/24/2017 FINDINGS: No fracture is identified. The knee and ankle are located. The bones appear diffusely osteopenic. No significant soft tissue abnormality is seen. IMPRESSION: No acute osseous abnormality. Electronically Signed   By: Logan Bores M.D.   On: 07/26/2018 20:33   Ct Head Wo Contrast  Result Date: 07/26/2018 CLINICAL DATA:  Left cheek abrasion after tripping over a curb and falling on her face. Taking blood thinners. EXAM: CT HEAD WITHOUT CONTRAST CT MAXILLOFACIAL WITHOUT CONTRAST CT CERVICAL SPINE WITHOUT CONTRAST TECHNIQUE: Multidetector CT imaging of the head, cervical spine, and maxillofacial structures were performed using the standard protocol without intravenous contrast. Multiplanar CT image reconstructions of the cervical spine and maxillofacial structures were also generated. COMPARISON:  Brain MR dated 06/19/2018. Cervical spine radiographs dated 01/29/2016. FINDINGS: CT HEAD FINDINGS Brain: Mildly enlarged ventricles and cortical sulci. Minimal patchy white matter low density  in both cerebral hemispheres. No intracranial hemorrhage, mass lesion or CT evidence of acute infarction. Vascular: No hyperdense vessel or unexpected calcification. Skull: Normal. Negative for fracture or focal lesion. Other: None. CT MAXILLOFACIAL FINDINGS Osseous: No fracture or mandibular dislocation. No destructive process. Orbits: Negative. No traumatic or inflammatory finding. Sinuses: Clear. Soft tissues: Unremarkable. CT CERVICAL SPINE FINDINGS Alignment: Normal. Skull base and vertebrae: No acute fracture. No primary bone lesion or focal pathologic process. Soft tissues and spinal canal: No prevertebral fluid or swelling. No visible canal hematoma. Disc levels:  Minimal multilevel degenerative changes. Upper chest: Clear lung apices. Other: Bilateral carotid artery calcifications. IMPRESSION: 1. No skull fracture or intracranial hemorrhage. 2. No maxillofacial fracture. 3. No cervical spine fracture or subluxation. 4. Mild diffuse cerebral and cerebellar atrophy. 5. Minimal chronic small vessel white matter ischemic changes in both cerebral hemispheres. 6. Cervical spine degenerative changes. 7. Bilateral carotid artery atheromatous calcifications. Electronically Signed   By: Claudie Revering M.D.   On: 07/26/2018 20:15   Ct Cervical Spine Wo Contrast  Result Date: 07/26/2018 CLINICAL DATA:  Left cheek abrasion after tripping over a curb and falling on her face. Taking blood thinners. EXAM: CT HEAD WITHOUT CONTRAST CT MAXILLOFACIAL WITHOUT CONTRAST CT CERVICAL SPINE WITHOUT CONTRAST TECHNIQUE: Multidetector CT imaging of the head, cervical spine, and maxillofacial structures were performed using the standard protocol without intravenous contrast. Multiplanar CT image reconstructions of the cervical spine and maxillofacial structures were also generated. COMPARISON:  Brain MR dated 06/19/2018. Cervical spine radiographs dated 01/29/2016. FINDINGS: CT HEAD FINDINGS Brain: Mildly enlarged ventricles and  cortical sulci. Minimal patchy white matter low density in both cerebral hemispheres. No intracranial hemorrhage, mass lesion or CT evidence of acute infarction. Vascular: No hyperdense vessel or unexpected calcification. Skull: Normal. Negative for fracture or focal lesion. Other: None. CT MAXILLOFACIAL FINDINGS Osseous: No fracture or mandibular dislocation. No destructive process. Orbits: Negative. No traumatic or inflammatory finding. Sinuses: Clear. Soft tissues: Unremarkable. CT CERVICAL SPINE FINDINGS  Alignment: Normal. Skull base and vertebrae: No acute fracture. No primary bone lesion or focal pathologic process. Soft tissues and spinal canal: No prevertebral fluid or swelling. No visible canal hematoma. Disc levels:  Minimal multilevel degenerative changes. Upper chest: Clear lung apices. Other: Bilateral carotid artery calcifications. IMPRESSION: 1. No skull fracture or intracranial hemorrhage. 2. No maxillofacial fracture. 3. No cervical spine fracture or subluxation. 4. Mild diffuse cerebral and cerebellar atrophy. 5. Minimal chronic small vessel white matter ischemic changes in both cerebral hemispheres. 6. Cervical spine degenerative changes. 7. Bilateral carotid artery atheromatous calcifications. Electronically Signed   By: Claudie Revering M.D.   On: 07/26/2018 20:15   Ct Maxillofacial Wo Cm  Result Date: 07/26/2018 CLINICAL DATA:  Left cheek abrasion after tripping over a curb and falling on her face. Taking blood thinners. EXAM: CT HEAD WITHOUT CONTRAST CT MAXILLOFACIAL WITHOUT CONTRAST CT CERVICAL SPINE WITHOUT CONTRAST TECHNIQUE: Multidetector CT imaging of the head, cervical spine, and maxillofacial structures were performed using the standard protocol without intravenous contrast. Multiplanar CT image reconstructions of the cervical spine and maxillofacial structures were also generated. COMPARISON:  Brain MR dated 06/19/2018. Cervical spine radiographs dated 01/29/2016. FINDINGS: CT HEAD  FINDINGS Brain: Mildly enlarged ventricles and cortical sulci. Minimal patchy white matter low density in both cerebral hemispheres. No intracranial hemorrhage, mass lesion or CT evidence of acute infarction. Vascular: No hyperdense vessel or unexpected calcification. Skull: Normal. Negative for fracture or focal lesion. Other: None. CT MAXILLOFACIAL FINDINGS Osseous: No fracture or mandibular dislocation. No destructive process. Orbits: Negative. No traumatic or inflammatory finding. Sinuses: Clear. Soft tissues: Unremarkable. CT CERVICAL SPINE FINDINGS Alignment: Normal. Skull base and vertebrae: No acute fracture. No primary bone lesion or focal pathologic process. Soft tissues and spinal canal: No prevertebral fluid or swelling. No visible canal hematoma. Disc levels:  Minimal multilevel degenerative changes. Upper chest: Clear lung apices. Other: Bilateral carotid artery calcifications. IMPRESSION: 1. No skull fracture or intracranial hemorrhage. 2. No maxillofacial fracture. 3. No cervical spine fracture or subluxation. 4. Mild diffuse cerebral and cerebellar atrophy. 5. Minimal chronic small vessel white matter ischemic changes in both cerebral hemispheres. 6. Cervical spine degenerative changes. 7. Bilateral carotid artery atheromatous calcifications. Electronically Signed   By: Claudie Revering M.D.   On: 07/26/2018 20:15    Procedures Procedures (including critical care time)  Medications Ordered in ED Medications  povidone-iodine (BETADINE) 10 % external solution (1 application  Given 03/25/83 1853)     Initial Impression / Assessment and Plan / ED Course  I have reviewed the triage vital signs and the nursing notes.  Pertinent labs & imaging results that were available during my care of the patient were reviewed by me and considered in my medical decision making (see chart for details).     Status post accidental fall striking face and left lower extremity.  Blood pressure noted to be  elevated.  CT head, CT maxillofacial, CT cervical spine, plain films of left tib-fib all negative.  Skin tear was dressed.  Discussed x-ray results with patient and her husband.  Patient will follow-up with her primary care doctor for blood pressure recommendations  Final Clinical Impressions(s) / ED Diagnoses   Final diagnoses:  Fall, initial encounter  Contusion of face, initial encounter  Contusion of left lower extremity, initial encounter  Skin tear of left lower leg without complication, initial encounter    ED Discharge Orders    None       Nat Christen, MD 07/26/18 2103

## 2018-07-26 NOTE — ED Notes (Signed)
Patient transported to CT 

## 2018-07-26 NOTE — ED Notes (Addendum)
Pt's hands soaking in saline solution with small amount of betadine,  Debris noted in wounds to bilateral hands

## 2018-07-26 NOTE — Discharge Instructions (Addendum)
X-ray showed no broken bones.  You will be sore for several days.  Tylenol or ibuprofen for pain.  Expect areas to get black and blue.  Simple dressing change.

## 2018-07-26 NOTE — ED Triage Notes (Addendum)
Pt reports she fell over curb and didn't step up enough to get over curb [place] on face. Noted to have abraised left cheek. Areas to both hand and left lower leg. Pt has large skin tear and bruising to left lower leg. Bleeding controlled Pt is on blood thinners

## 2018-07-31 ENCOUNTER — Encounter: Payer: Self-pay | Admitting: Family

## 2018-07-31 ENCOUNTER — Ambulatory Visit (INDEPENDENT_AMBULATORY_CARE_PROVIDER_SITE_OTHER): Payer: Medicare Other | Admitting: Family

## 2018-07-31 VITALS — BP 197/89 | HR 68 | Temp 98.0°F | Ht 61.0 in | Wt 121.2 lb

## 2018-07-31 DIAGNOSIS — Z5189 Encounter for other specified aftercare: Secondary | ICD-10-CM

## 2018-07-31 DIAGNOSIS — Z09 Encounter for follow-up examination after completed treatment for conditions other than malignant neoplasm: Secondary | ICD-10-CM | POA: Diagnosis not present

## 2018-07-31 DIAGNOSIS — S81812D Laceration without foreign body, left lower leg, subsequent encounter: Secondary | ICD-10-CM

## 2018-07-31 DIAGNOSIS — W101XXS Fall (on)(from) sidewalk curb, sequela: Secondary | ICD-10-CM

## 2018-07-31 NOTE — Progress Notes (Signed)
   Subjective:    Patient ID: Susan Fitzgerald, female    DOB: 08-22-1932, 84 y.o.   MRN: [id number]  Chief Complaint  Patient presents with  . skin tear on left lower leg   Pt presents to the office today for wound recheck. She fell on Sunday hit left leg, head, and hands. She had a negative CT head, CT maxillofcial, CT cervical spine, and plain films of left tib-fib. She has a larger skin tear on on left lower leg.  Wound Check  She was originally treated 2 to 3 days ago. Previous treatment included wound cleansing or irrigation. Her temperature was unmeasured prior to arrival. There has been clear and bloody discharge from the wound. There is no redness present. There is no swelling present. The pain has not changed. She has no difficulty moving the affected extremity or digit.   [facility] follow up   Review of Systems  Skin: Positive for wound.  Hematological: Bruises/bleeds easily.  All other systems reviewed and are negative.      Objective:   Physical Exam  Constitutional: She is oriented to person, place, and time. She appears well-developed and well-nourished. No distress.  HENT:  Head: Normocephalic.  Eyes: Pupils are equal, round, and reactive to light.  Neck: Normal range of motion. Neck supple. No thyromegaly present.  Cardiovascular: Normal rate, regular rhythm, normal heart sounds and intact distal pulses.  No murmur heard. Pulmonary/Chest: Effort normal and breath sounds normal. No respiratory distress. She has no wheezes.  Abdominal: Soft. Bowel sounds are normal. She exhibits no distension. There is no tenderness.  Musculoskeletal: She exhibits no edema or tenderness.  Neurological: She is alert and oriented to person, place, and time. She has normal reflexes. No cranial nerve deficit.  Skin: Skin is warm and dry.  Psychiatric: She has a normal mood and affect. Her behavior is normal. Judgment and thought content normal.  Vitals reviewed.      Area cleaned,  Vaseline gauze, gauze and cling dressing applied   BP (!) 197/89   Pulse 68   Temp 98 F (36.7 C) (Oral)   Ht 5\' 1"  (1.549 m)   Wt 121 lb 3.2 oz (55 kg)   BMI 22.90 kg/m      Assessment & Plan:  [name] comes in today with chief complaint of skin tear on left lower leg   Diagnosis and orders addressed:  1. Skin tear of left lower leg without complication, subsequent encounter  2. Fall (on)(from) sidewalk curb, sequela  3. Hospital discharge follow-up  4. Visit for wound check   Continue dressing changes daily Report any s/s of infection, redness, erythemas or discharge RTO if symptoms worsen or do not improve   Evelina Dun, FNP

## 2018-07-31 NOTE — Patient Instructions (Signed)
Skin Tear Care A skin tear is a wound in which the top layers of skin have peeled off the deeper skin or tissues underneath them. This is a common problem as people get older because the skin becomes thinner and more fragile. In addition, some medicines, such as oral corticosteroids, can lead to skin thinning if they are taken for long periods of time. A skin tear is often repaired with tape or skin adhesive strips. Depending on the location of the wound, a bandage (dressing) may be applied over the tape or skin adhesive strips. Follow these instructions at home: Wound care  Clean the wound as told by your health care provider. You may be instructed to keep the wound dry for the first few days. If you are told to clean the wound: ? Wash the wound with mild soap and water or a salt-water (saline) solution. ? Rinse the wound with water to remove all soap. ? Do not rub the wound dry. Let the wound air dry.  Change any dressings as told by your health care provider. This includes changing the dressing if it gets wet, gets dirty, or starts to smell bad.  Do not scratch or pick at the wound.  Protect the injured area until it has healed.  Check your wound every day for signs of infection. Check for: ? More redness, swelling, or pain. ? More fluid or blood. ? Warmth. ? Pus or a bad smell. Medicines   Take over-the-counter and prescription medicines only as told by your health care provider.  If you were prescribed an antibiotic medicine, take or apply it as told by your health care provider. Do not stop using the antibiotic even if your condition improves. General instructions  Keep the dressing dry as told by your health care provider.  Do not take baths, swim, or do anything that puts your wound underwater until your health care provider approves.  Keep all follow-up visits as told by your health care provider. This is important. Contact a health care provider if:  You have more  redness, swelling, or pain around your wound.  You have more fluid or blood coming from your wound.  Your wound feels warm to the touch.  You have pus or a bad smell coming from your wound. Get help right away if:  You have a red streak that goes away from the skin tear.  You have a fever and chills and your symptoms suddenly get worse. This information is not intended to replace advice given to you by your health care provider. Make sure you discuss any questions you have with your health care provider. Document Released: 07/30/2001 Document Revised: 06/30/2016 Document Reviewed: 09/25/2015 Elsevier Interactive Patient Education  [employer].

## 2018-08-03 ENCOUNTER — Encounter: Payer: Medicare Other | Admitting: Internal Medicine

## 2018-08-03 ENCOUNTER — Encounter

## 2018-08-03 ENCOUNTER — Telehealth: Payer: Self-pay | Admitting: Family Medicine

## 2018-08-03 NOTE — Telephone Encounter (Signed)
Discussed would and supplies with pt.

## 2018-08-06 ENCOUNTER — Telehealth: Payer: Self-pay | Admitting: Family Medicine

## 2018-08-06 NOTE — Telephone Encounter (Signed)
PT had leg dressed Monday and jamie told her to come back this week to get it redressed, wants to know if Zigmund Daniel can do it this afternoon. If she doesn't answer home call cell husband has an apt later

## 2018-08-06 NOTE — Telephone Encounter (Signed)
Pt notified ok to come by for dressing change

## 2018-08-09 NOTE — Progress Notes (Signed)
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  Subjective:   PATIENT ID: Susan Fitzgerald GENDER: female DOB: [birth year], MRN: [id number]  Chief Complaint  Patient presents with  . Follow-up    States she recently fell, has a wound on LLE. States she has not felt the need to use her oxygen. Completed prednisne last thursday.     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   Over the past year has notice her SOB. However of the past several months her SOB has been limiting her activity. She was an avid golfer and has recently been unable due to her SOB. She was started on prednisone for costochondritis back in June. During that time while she was on prednisone she felt much better. Started on amiodarone around 2 years ago in 2017, currently taking 279m daily, Higher loading dose at start.  Records from 05/25/2018, Dr. BAmil Amen rheumatology.  Patient had mild RF positive, positive anti-CCP, hand synovitis on exam, hand x-rays May 2017 with right ulnar styloid and early erosions of the right wrist and the second MCP.  She was treated for some time with methotrexate once weekly.  This has been stopped currently after having recent elevation in serum creatinine.  OV 07/08/2018 -since she was last seen in the office we obtained a high-resolution CT scan.  Per radiology there was evidence of congestive heart failure.  She was since followed up with cardiology and they increased her diuretic regimen.  She was started on oxygen therapy after her last visit.  OV 08/10/2018: She was started on tapering prednisone dose at her last visit.  Unfortunately on 07/26/2018 patient suffered a fall as well as a large skin tear along the left anterior tibia.  She recently completed  her last dose of prednisone this past Thursday.  She is now feeling like she is almost back to normal.  She does feel little weak.  She had no longer been using her oxygen as she is not felt like she is needed it.  She has been checking her pulse ox at home all of which have been in the mid 90s.  She denies dyspnea on exertion and shortness of breath cough or hemoptysis.   Past Medical History:  Diagnosis Date  . Arthritis   . Bradycardia 2014  . Calcification of bronchial airway 06/23/2018   01/2017 - See on CT imaging of the chest   . Cataract   . Cerebral vascular disease 06/19/2018   06/19/2018 - MRI with chronic microvascular ischemic change  . Diverticulosis   . Dyspnea    periodically  . Dysrhythmia    a - fib  . Heart murmur    ??? bruit  . History of stress test    a. 04/09/13-nuclear stress-no ischemia low risk  . Hypertension   . Menopause   . Osteoporosis   . PAF (paroxysmal atrial fibrillation) ([place]    a. s/p prior DCCV; b. On flecainide & coumadin (CHA2DS2VASc = 4);  c. 03/2016 Echo: EF 55-60%, mod LVH, mod AI, mild to mod TR, PASP 572mg.  . Marland Kitchenelvic fracture (HCC)      Family History  Problem Relation Age of Onset  . Stroke Mother        cerebral hemorrhage  . Heart  disease Father        MI  . Heart attack Father   . Vision loss Father   . Heart disease Brother   . Heart attack Brother   . Cancer Maternal Aunt        breast  . Cancer Brother   . Heart disease Brother   . Cancer Daughter        breast    Past Surgical History:  Procedure Laterality Date  . BIOPSY BREAST    . BIOPSY BREAST     right & benign  . BREAST SURGERY    . CARDIOVERSION N/A 10/30/2016   Procedure: CARDIOVERSION;  Surgeon: Larey Dresser, MD;  Location: [place];  Service: Cardiovascular;  Laterality: N/A;  . CARDIOVERSION N/A 08/20/2017   Procedure: CARDIOVERSION;  Surgeon: Jerline Pain, MD;  Location: [facility] ENDOSCOPY;  Service: Cardiovascular;  Laterality: N/A;  . CATARACT  EXTRACTION    . EYE SURGERY     cataracts  . TEE WITHOUT CARDIOVERSION N/A 10/30/2016   Procedure: TRANSESOPHAGEAL ECHOCARDIOGRAM (TEE);  Surgeon: Larey Dresser, MD;  Location: [place];  Service: Cardiovascular;  Laterality: N/A;  . TOTAL HIP ARTHROPLASTY Right 01/27/2017   Procedure: TOTAL HIP ARTHROPLASTY ANTERIOR APPROACH;  Surgeon: Rod Can, MD;  Location: [place];  Service: Orthopedics;  Laterality: Right;    Social History   Socioeconomic History  . Marital status: Married    Spouse name: Not on file  . Number of children: Not on file  . Years of education: Not on file  . Highest education level: Not on file  Occupational History  . Not on file  Social Needs  . Financial resource strain: Not on file  . Food insecurity:    Worry: Not on file    Inability: Not on file  . Transportation needs:    Medical: Not on file    Non-medical: Not on file  Tobacco Use  . Smoking status: Never Smoker  . Smokeless tobacco: Never Used  Substance and Sexual Activity  . Alcohol use: No  . Drug use: No  . Sexual activity: Yes  Lifestyle  . Physical activity:    Days per week: Not on file    Minutes per session: Not on file  . Stress: Not on file  Relationships  . Social connections:    Talks on phone: Not on file    Gets together: Not on file    Attends religious service: Not on file    Active member of club or organization: Not on file    Attends meetings of clubs or organizations: Not on file    Relationship status: Not on file  . Intimate partner violence:    Fear of current or ex partner: Not on file    Emotionally abused: Not on file    Physically abused: Not on file    Forced sexual activity: Not on file  Other Topics Concern  . Not on file  Social History Narrative  . Not on file     Allergies  Allergen Reactions  . Benazepril Hcl Cough     Outpatient Medications Prior to Visit  Medication Sig Dispense Refill  . apixaban (ELIQUIS) 2.5 MG TABS tablet  Take 2.5 mg by mouth 2 (two) times daily. As directed   Currently on hold    . calcium-vitamin D (OSCAL WITH D) 250-125 MG-UNIT per tablet Take 1 tablet by mouth 2 (two) times daily.    Marland Kitchen diltiazem (CARDIZEM CD) 120  MG 24 hr capsule Take 1 capsule (120 mg total) by mouth daily. 90 capsule 3  . folic acid (FOLVITE) 1 MG tablet Take 2 mg by mouth daily.     . furosemide (LASIX) 20 MG tablet Take 1 tablet (20 mg total) by mouth daily. 180 tablet 1  . gabapentin (NEURONTIN) 100 MG capsule Take 100 mg by mouth at bedtime.     Marland Kitchen levothyroxine (SYNTHROID, LEVOTHROID) 75 MCG tablet TAKE 1 TABLET BY MOUTH  DAILY 90 tablet 3  . lisinopril (PRINIVIL,ZESTRIL) 40 MG tablet TAKE 1 TABLET BY MOUTH  DAILY 90 tablet 0  . Melatonin 5 MG TABS Take 5 mg by mouth at bedtime.    . Multiple Vitamin (MULTIVITAMIN) capsule Take 1 capsule by mouth daily.    . predniSONE (DELTASONE) 10 MG tablet Starting 07/09/2018 take 4 tablets (59m) x 2 weeks then starting September 4th take 2 tablets (276m and hold until next office visit. 84 tablet 0   No facility-administered medications prior to visit.     Review of Systems  Constitutional: Positive for malaise/fatigue. Negative for fever and weight loss.  HENT: Negative for congestion, ear pain, nosebleeds and sore throat.   Eyes: Negative for redness.  Respiratory: Negative for cough, shortness of breath and wheezing.   Cardiovascular: Negative for palpitations, leg swelling and PND.  Gastrointestinal: Negative for nausea and vomiting.  Genitourinary: Negative for dysuria.  Musculoskeletal: Positive for falls. Negative for joint pain.  Skin: Negative for rash.  Neurological: Negative for headaches.  Endo/Heme/Allergies: Does not bruise/bleed easily.  Psychiatric/Behavioral: Negative for depression. The patient is not nervous/anxious.     Objective:  Physical Exam  Constitutional: She is oriented to person, place, and time. She appears well-developed and  well-nourished. No distress.  HENT:  Head: Normocephalic and atraumatic.  Mouth/Throat: Oropharynx is clear and moist.  Eyes: Pupils are equal, round, and reactive to light. Conjunctivae are normal. No scleral icterus.  Neck: Neck supple. No JVD present. No tracheal deviation present.  Cardiovascular: Normal rate and intact distal pulses. An irregularly irregular rhythm present.  Murmur (systolic murmur LLSB ) heard. Pulmonary/Chest: Effort normal and breath sounds normal. No accessory muscle usage or stridor. No tachypnea. No respiratory distress. She has no wheezes. She has no rhonchi. She has no rales.  Abdominal: Soft. Bowel sounds are normal. She exhibits no distension. There is no tenderness.  Musculoskeletal: She exhibits edema, tenderness and deformity.  LLE anterior shin skin tear bandaged  Lymphadenopathy:    She has no cervical adenopathy.  Neurological: She is alert and oriented to person, place, and time.  Skin: Skin is warm and dry. Capillary refill takes less than 2 seconds. No rash noted.  Psychiatric: She has a normal mood and affect. Her behavior is normal.  Vitals reviewed.   Vitals:   08/10/18 1139  BP: (!) 180/90  Pulse: 76  SpO2: 95%  Weight: 121 lb 6.4 oz (55.1 kg)  Height: '5\' 1"'  (1.549 m)   95% on RA  BMI Readings from Last 3 Encounters:  08/10/18 22.94 kg/m  07/31/18 22.90 kg/m  07/26/18 22.86 kg/m   Wt Readings from Last 3 Encounters:  08/10/18 121 lb 6.4 oz (55.1 kg)  07/31/18 121 lb 3.2 oz (55 kg)  07/26/18 121 lb (54.9 kg)    CBC    Component Value Date/Time   WBC 8.3 06/30/2018 1236   WBC 4.8 08/15/2017 1206   RBC 3.40 (L) 06/30/2018 1236   RBC 4.05 08/15/2017 1206  HGB 11.2 06/30/2018 1236   HCT 34.9 06/30/2018 1236   PLT 320 06/30/2018 1236   MCV 103 (H) 06/30/2018 1236   MCH 32.9 06/30/2018 1236   MCH 30.6 08/15/2017 1206   MCHC 32.1 06/30/2018 1236   MCHC 32.0 08/15/2017 1206   RDW 15.7 (H) 06/30/2018 1236   LYMPHSABS 1.0  06/30/2018 1236   MONOABS 0.8 02/10/2017 0332   EOSABS 0.5 (H) 06/30/2018 1236   BASOSABS 0.0 06/30/2018 1236   Lab Results  Component Value Date   ALT 80 (H) 06/08/2018   AST 78 (H) 06/08/2018   ALKPHOS 63 06/08/2018   BILITOT 0.4 06/08/2018   Lab Results  Component Value Date   ESRSEDRATE 18 06/16/2018   Lab Results  Component Value Date   CREATININE 1.03 (H) 06/30/2018    Chest Imaging: CT imaging from 2017 was reviewed with basilar intersitial changes (my read)  CXR from 05/2018 reviewed with bilateral prominent interstitial markings. (my read)    High-resolution CT from August 2019 Evidence of interlobular septal thickening and patchy ground glass opacities.  There is evidence in the upper lobes as well as the lower lobes however there is small amounts of bilateral pleural fluid.  This could represent pulmonary edema and decompensation of her known heart failure.  However the upper lobe predominance and is also seen more in the right than the left.  This could represent drug-induced disease. The patient's images have been independently reviewed by me.    Pulmonary Functions Testing Results:  07/07/2018 TLC - 56%  6MWT: Distance 288 meters, 2LNC   FeNO: None   Pathology: None   Echocardiogram:  01/27/2018 Study Conclusions  - Left ventricle: The cavity size was normal. Wall thickness was   increased in a pattern of mild LVH. There was mild focal basal   hypertrophy of the septum. Systolic function was normal. The   estimated ejection fraction was in the range of 55% to 60%. Wall   motion was normal; there were no regional wall motion   abnormalities. Doppler parameters are consistent with abnormal   left ventricular relaxation (grade 1 diastolic dysfunction). - Aortic valve: There was mild regurgitation. - Ascending aorta: The ascending aorta was mildly dilated. - Mitral valve: Calcified annulus. - Pulmonary arteries: Systolic pressure was mildly  increased.  Impressions:  - Normal LV systolic function; mild diastolic dysfunction; mild   LVH; calcified aortic valve with mild AI; mildly dilated   ascending aorta; mild TR; mild pulmonary hypertension.  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  She has greatly improved after approximately 1 month of prednisone.  She completed her prednisone dosing last month she started to feel somewhat back to normal.  She does have rheumatoid arthritis and has been on methotrexate which was held during this time as well it is unclear as to if any of this was related to the methotrexate.  I do feel as if she had a component of decompensated heart failure which is also improved with additional diuresis.  She did have a prior attempted PFTs but were unable to complete the study.  Her lung volumes which were obtained revealed mild restriction.  Plan:  At this time I think she should  remain off of prednisone.  She needs to follow-up with her rheumatologist to discuss reinstating her antirheumatic drugs whether this be methotrexate or an alternative.  I do believe she should hold off any additional dosing of amiodarone at this time as her respiratory symptoms have greatly improved.  Would encourage enrollment and cardio pulmonary rehab if possible.  I think that a large portion of her weakness is related to debility and deconditioning at this point.  Plan to obtain HRCT imaging in 6 months to 1 year pending symptoms for follow-up on any parenchymal change.  Will walk in office today to see if she needs continued oxygen supplementation with exertion.  Return to clinic in 2  months.   Current Outpatient Medications:  .  apixaban (ELIQUIS) 2.5 MG TABS tablet, Take 2.5 mg by mouth 2 (two) times daily. As directed   Currently on hold, Disp: , Rfl:  .  calcium-vitamin D (OSCAL WITH D) 250-125 MG-UNIT per tablet, Take 1 tablet by mouth 2 (two) times daily., Disp: , Rfl:  .  diltiazem (CARDIZEM CD) 120 MG 24 hr capsule, Take 1 capsule (120 mg total) by mouth daily., Disp: 90 capsule, Rfl: 3 .  folic acid (FOLVITE) 1 MG tablet, Take 2 mg by mouth daily. , Disp: , Rfl:  .  furosemide (LASIX) 20 MG tablet, Take 1 tablet (20 mg total) by mouth daily., Disp: 180 tablet, Rfl: 1 .  gabapentin (NEURONTIN) 100 MG capsule, Take 100 mg by mouth at bedtime. , Disp: , Rfl:  .  levothyroxine (SYNTHROID, LEVOTHROID) 75 MCG tablet, TAKE 1 TABLET BY MOUTH  DAILY, Disp: 90 tablet, Rfl: 3 .  lisinopril (PRINIVIL,ZESTRIL) 40 MG tablet, TAKE 1 TABLET BY MOUTH  DAILY, Disp: 90 tablet, Rfl: 0 .  Melatonin 5 MG TABS, Take 5 mg by mouth at bedtime., Disp: , Rfl:  .  Multiple Vitamin (MULTIVITAMIN) capsule, Take 1 capsule by mouth daily., Disp: , Rfl:  .  predniSONE (DELTASONE) 10 MG tablet, Starting 07/09/2018 take 4 tablets (62m) x 2 weeks then starting September 4th take 2 tablets (250m and hold until next office visit., Disp: 84 tablet, Rfl: 0   BrGarner NashDO [facility] 08/10/2018 12:50 PM

## 2018-08-10 ENCOUNTER — Encounter: Payer: Self-pay | Admitting: Pulmonary Disease

## 2018-08-10 ENCOUNTER — Ambulatory Visit: Payer: Medicare Other | Admitting: Pulmonary Disease

## 2018-08-10 ENCOUNTER — Other Ambulatory Visit: Payer: Self-pay | Admitting: *Deleted

## 2018-08-10 VITALS — BP 180/90 | HR 76 | Ht 61.0 in | Wt 121.4 lb

## 2018-08-10 DIAGNOSIS — I482 Chronic atrial fibrillation, unspecified: Secondary | ICD-10-CM

## 2018-08-10 DIAGNOSIS — R531 Weakness: Secondary | ICD-10-CM

## 2018-08-10 DIAGNOSIS — I5032 Chronic diastolic (congestive) heart failure: Secondary | ICD-10-CM

## 2018-08-10 DIAGNOSIS — J849 Interstitial pulmonary disease, unspecified: Secondary | ICD-10-CM

## 2018-08-10 MED ORDER — CEPHALEXIN 500 MG PO CAPS: 500.0000 mg | ORAL_CAPSULE | Freq: Three times a day (TID) | ORAL | 0 refills | Status: DC

## 2018-08-10 NOTE — Addendum Note (Signed)
Addended by: Vivia Ewing on: 08/10/2018 01:38 PM   Modules accepted: Orders

## 2018-08-10 NOTE — Patient Instructions (Signed)
Remain off prednisone at this time.  Follow up with rheumatology about restarting anti-rheumatic drugs for RA.  Referral to pulmonary rehab.  RTC in 2 months

## 2018-08-11 ENCOUNTER — Telehealth: Payer: Self-pay | Admitting: Family Medicine

## 2018-08-11 NOTE — Telephone Encounter (Signed)
Pt aware med is correct

## 2018-08-12 ENCOUNTER — Ambulatory Visit (INDEPENDENT_AMBULATORY_CARE_PROVIDER_SITE_OTHER): Payer: Medicare Other | Admitting: Family Medicine

## 2018-08-12 ENCOUNTER — Telehealth (HOSPITAL_COMMUNITY): Payer: Self-pay

## 2018-08-12 ENCOUNTER — Encounter: Payer: Self-pay | Admitting: Family Medicine

## 2018-08-12 VITALS — BP 147/70 | HR 83 | Temp 98.7°F | Wt 119.8 lb

## 2018-08-12 DIAGNOSIS — I10 Essential (primary) hypertension: Secondary | ICD-10-CM | POA: Diagnosis not present

## 2018-08-12 DIAGNOSIS — S81812D Laceration without foreign body, left lower leg, subsequent encounter: Secondary | ICD-10-CM

## 2018-08-12 NOTE — Telephone Encounter (Signed)
Called and spoke with patient in regards to Pulmonary Rehab - Scheduled orientation on 09/14/18 at 1:30pm. Patient will attend the 10:30am exc class. Mailed packet.

## 2018-08-12 NOTE — Telephone Encounter (Signed)
Patients insurance is active and benefits verified through [employer] - $20.00 co-pay, no deductible, out of pocket amount of $6,700/$1,149.50 has been met, no co-insurance, and no pre-authorization is required. Reference #[id number]

## 2018-08-12 NOTE — Progress Notes (Signed)
   Subjective:    Patient ID: Susan Fitzgerald, female    DOB: [birth year], 84 y.o.   MRN: [id number]  HPI  Patient is here for wound recheck of left lower leg from skin tear due to fall on 07/26/2018.  She was recently started on some Keflex because of worsening redness around the wound.  Patient recently saw Dr. Valeta Harms, her pulmonologist.  He is still thinking that some of the thickening and opacities could be due to drug induced disease.        Review of Systems  Skin: Positive for wound (left lower leg, increased swelling and redness).       Objective:   Physical Exam BP (!) 147/70   Pulse 83   Temp 98.7 F (37.1 C) (Oral)   Wt 119 lb 12.8 oz (54.3 kg)   BMI 22.64 kg/m   Leg wound is still red but not feverish and there is slight drainage from a couple places on the leg.  We will do saline wet-to-dry compresses and will use an netting on the leg and 7 Ace bandage.     Assessment & Plan:  1. Skin tear of left lower leg without complication, subsequent encounter -Saline wet-to-dry and return to the office in a couple days for recheck -Elevate leg is much as possible -Continue with Keflex  2.  Essential hypertension -Increase cardiazem to 180 and recheck blood pressure in a couple days -Get BMP and CBC and send a copy to [place], [place], and [place] and [place]  Patient Instructions  Continue with wet-to-dry saline compresses and change dressing daily Return to the office in a couple days for recheck of current dressing. Increase cardia zyme to 180 once daily check home blood pressures and come by the office for recheck of wound in a couple days and recheck blood pressure at that time  Arrie Senate MD   .

## 2018-08-12 NOTE — Telephone Encounter (Signed)
Patient called back and stated she would rather go to [facility] for Rehab. Sent referral to [facility] and called [facility] to inform.

## 2018-08-12 NOTE — Patient Instructions (Addendum)
Continue with wet-to-dry saline compresses and change dressing daily Return to the office in a couple days for recheck of current dressing. Increase cardia zyme to 180 once daily check home blood pressures and come by the office for recheck of wound in a couple days and recheck blood pressure at that time

## 2018-08-12 NOTE — Addendum Note (Signed)
Addended by: Marin Olp on: 08/12/2018 10:17 AM   Modules accepted: Orders

## 2018-08-13 ENCOUNTER — Telehealth: Payer: Self-pay | Admitting: *Deleted

## 2018-08-13 LAB — CBC WITH DIFFERENTIAL/PLATELET
BASOS ABS: 0 10*3/uL (ref 0.0–0.2)
Basos: 0 %
EOS (ABSOLUTE): 0.1 10*3/uL (ref 0.0–0.4)
Eos: 2 %
Hematocrit: 33.6 % — ABNORMAL LOW (ref 34.0–46.6)
Hemoglobin: 11.5 g/dL (ref 11.1–15.9)
IMMATURE GRANS (ABS): 0 10*3/uL (ref 0.0–0.1)
Immature Granulocytes: 0 %
LYMPHS: 12 %
Lymphocytes Absolute: 0.8 10*3/uL (ref 0.7–3.1)
MCH: 33.1 pg — ABNORMAL HIGH (ref 26.6–33.0)
MCHC: 34.2 g/dL (ref 31.5–35.7)
MCV: 97 fL (ref 79–97)
MONOCYTES: 15 %
Monocytes Absolute: 1 10*3/uL — ABNORMAL HIGH (ref 0.1–0.9)
NEUTROS ABS: 4.8 10*3/uL (ref 1.4–7.0)
NEUTROS PCT: 71 %
PLATELETS: 207 10*3/uL (ref 150–450)
RBC: 3.47 x10E6/uL — ABNORMAL LOW (ref 3.77–5.28)
RDW: 13.5 % (ref 12.3–15.4)
WBC: 6.8 10*3/uL (ref 3.4–10.8)

## 2018-08-13 LAB — BASIC METABOLIC PANEL
BUN / CREAT RATIO: 13 (ref 12–28)
BUN: 15 mg/dL (ref 8–27)
CALCIUM: 8.3 mg/dL — AB (ref 8.7–10.3)
CHLORIDE: 101 mmol/L (ref 96–106)
CO2: 24 mmol/L (ref 20–29)
Creatinine, Ser: 1.2 mg/dL — ABNORMAL HIGH (ref 0.57–1.00)
GFR calc Af Amer: 48 mL/min/{1.73_m2} — ABNORMAL LOW (ref >59)
GFR calc non Af Amer: 42 mL/min/{1.73_m2} — ABNORMAL LOW (ref >59)
GLUCOSE: 78 mg/dL (ref 65–99)
Potassium: 3.4 mmol/L — ABNORMAL LOW (ref 3.5–5.2)
Sodium: 144 mmol/L (ref 134–144)

## 2018-08-13 NOTE — Telephone Encounter (Signed)
-----   Message from Chipper Herb, MD sent at 08/13/2018  9:18 AM EDT ----- The CBC has a normal white blood cell count.  The hemoglobin continues to improve slightly and is now within normal limits at 11.5.  Platelet count is adequate. The blood sugar is good at 78.  The creatinine, the most important kidney function test is slightly more elevated than previously at 1.20.  We will continue to monitor this.  The electrolytes including potassium have a potassium that is slightly decreased at 3.4.  Sodium and chloride are good.  Please start potassium 10 mEq 1 daily and recheck BMP next week. Should encourage patient to change dressings daily using wet-to-dry saline.  It is just a matter of removing the dressings from the previous day and reapplying dressings again that are wet-to-dry with saline.  Also, recheck blood pressure and let me know what it is when you see her tomorrow in the nurses visit.

## 2018-08-14 ENCOUNTER — Encounter: Payer: Self-pay | Admitting: Family

## 2018-08-14 ENCOUNTER — Ambulatory Visit (INDEPENDENT_AMBULATORY_CARE_PROVIDER_SITE_OTHER): Payer: Medicare Other | Admitting: Family

## 2018-08-14 VITALS — BP 158/70 | HR 74 | Temp 98.7°F | Ht 61.0 in | Wt 120.0 lb

## 2018-08-14 DIAGNOSIS — L089 Local infection of the skin and subcutaneous tissue, unspecified: Secondary | ICD-10-CM

## 2018-08-14 DIAGNOSIS — S81812D Laceration without foreign body, left lower leg, subsequent encounter: Secondary | ICD-10-CM | POA: Diagnosis not present

## 2018-08-14 DIAGNOSIS — T148XXA Other injury of unspecified body region, initial encounter: Principal | ICD-10-CM

## 2018-08-14 DIAGNOSIS — L03116 Cellulitis of left lower limb: Secondary | ICD-10-CM

## 2018-08-14 MED ORDER — DOXYCYCLINE HYCLATE 100 MG PO TABS
100.0000 mg | ORAL_TABLET | Freq: Two times a day (BID) | ORAL | 0 refills | Status: DC
Start: 2018-08-14 — End: 2018-08-25

## 2018-08-14 NOTE — Patient Instructions (Signed)

## 2018-08-14 NOTE — Progress Notes (Signed)
   Subjective:    Patient ID: Susan Fitzgerald, female    DOB: [birth year], 84 y.o.   MRN: [id number]  Chief Complaint  Patient presents with  . skin tear on left leg looking infected   Pt presents to the office today to check skin tear of lower left leg. She states she was started on Keflex BID on Monday. She states her redness and swelling are worse today.  Wound Check  She was originally treated more than 14 days ago. Previous treatment included wound cleansing or irrigation. Her temperature was unmeasured prior to arrival. There has been bloody and colored discharge from the wound. The redness has worsened. The swelling has not changed. The pain has improved.      Review of Systems  Skin: Positive for wound.  All other systems reviewed and are negative.      Objective:   Physical Exam  Constitutional: She is oriented to person, place, and time. She appears well-developed and well-nourished.  Neck: Normal range of motion.  Cardiovascular: Normal rate and regular rhythm.  Pulmonary/Chest: Breath sounds normal. Stridor present. She is in respiratory distress.  Musculoskeletal: She exhibits edema (2+ LLE) and tenderness.  Neurological: She is alert and oriented to person, place, and time.  Skin: Skin is warm. There is erythema (skin tear).  Skin tear, approx 9.5 cm X6 cm        BP (!) 158/70   Pulse 74   Temp 98.7 F (37.1 C) (Oral)   Ht 5\' 1"  (1.549 m)   Wt 120 lb (54.4 kg)   BMI 22.67 kg/m      Assessment & Plan:  Susan Fitzgerald comes in today with chief complaint of skin tear on left leg looking infected   Diagnosis and orders addressed:  1. Infected skin tear - doxycycline (VIBRA-TABS) 100 MG tablet; Take 1 tablet (100 mg total) by mouth 2 (two) times daily.  Dispense: 20 tablet; Refill: 0  2. Cellulitis of left lower extremity - doxycycline (VIBRA-TABS) 100 MG tablet; Take 1 tablet (100 mg total) by mouth 2 (two) times daily.  Dispense: 20 tablet; Refill:  0  Will stop Keflex and start Doxycycline  Dressing change Keep foot elevated when possible RTO on Monday for dressing change  Report any changes with pain, fever, discharge, or erythemas   Susan Dun, FNP

## 2018-08-17 ENCOUNTER — Encounter: Payer: Self-pay | Admitting: Family

## 2018-08-17 ENCOUNTER — Ambulatory Visit (INDEPENDENT_AMBULATORY_CARE_PROVIDER_SITE_OTHER): Payer: Medicare Other | Admitting: Family

## 2018-08-17 VITALS — BP 115/66 | HR 81 | Temp 98.7°F | Ht 61.0 in | Wt 118.4 lb

## 2018-08-17 DIAGNOSIS — L089 Local infection of the skin and subcutaneous tissue, unspecified: Secondary | ICD-10-CM

## 2018-08-17 DIAGNOSIS — S81812D Laceration without foreign body, left lower leg, subsequent encounter: Secondary | ICD-10-CM

## 2018-08-17 DIAGNOSIS — T148XXA Other injury of unspecified body region, initial encounter: Principal | ICD-10-CM

## 2018-08-17 MED ORDER — CEFTRIAXONE SODIUM 1 G IJ SOLR
1.0000 g | Freq: Once | INTRAMUSCULAR | Status: AC
  Administered 2018-08-17: 1 g via INTRAMUSCULAR

## 2018-08-17 NOTE — Progress Notes (Signed)
   Subjective:    Patient ID: Susan Fitzgerald, female    DOB: 08-19-1932, 84 y.o.   MRN: [id number]  Chief Complaint  Patient presents with  . skin tear recheck    Wound Check  She was originally treated 10 to 14 days ago. Previous treatment included oral antibiotics. There has been bloody and colored discharge from the wound. The redness has not changed. The swelling has worsened. The pain has not changed.      Review of Systems  Skin: Positive for wound.  All other systems reviewed and are negative.      Objective:   Physical Exam  Constitutional: She is oriented to person, place, and time. She appears well-developed and well-nourished. No distress.  HENT:  Head: Normocephalic.  Eyes: Pupils are equal, round, and reactive to light.  Neck: Normal range of motion. Neck supple. No thyromegaly present.  Cardiovascular: Normal rate, regular rhythm, normal heart sounds and intact distal pulses.  No murmur heard. Pulmonary/Chest: Effort normal and breath sounds normal. No respiratory distress. She has no wheezes.  Abdominal: Soft. Bowel sounds are normal. She exhibits no distension. There is no tenderness.  Musculoskeletal: Normal range of motion. She exhibits no edema or tenderness.  Neurological: She is alert and oriented to person, place, and time. She has normal reflexes. No cranial nerve deficit.  Skin: Skin is warm and dry.  Psychiatric: She has a normal mood and affect. Her behavior is normal. Judgment and thought content normal.  Vitals reviewed.    Area cleaned and nonadherent dressing applied, then gauze cling  BP 115/66   Pulse 81   Temp 98.7 F (37.1 C) (Oral)   Ht 5\' 1"  (1.549 m)   Wt 118 lb 6.4 oz (53.7 kg)   BMI 22.37 kg/m      Assessment & Plan:  1. Infected skin tear Continue doxycycline and Keflex Change dressing daily Report any increase redness, swelling, and pain RTO in 1 week or sooner if symptoms worsen  - cefTRIAXone (ROCEPHIN) injection 1  g   Evelina Dun, FNP

## 2018-08-17 NOTE — Patient Instructions (Signed)
Skin Tear Care A skin tear is a wound in which the top layers of skin have peeled off the deeper skin or tissues underneath them. This is a common problem as people get older because the skin becomes thinner and more fragile. In addition, some medicines, such as oral corticosteroids, can lead to skin thinning if they are taken for long periods of time. A skin tear is often repaired with tape or skin adhesive strips. Depending on the location of the wound, a bandage (dressing) may be applied over the tape or skin adhesive strips. Follow these instructions at home: Wound care  Clean the wound as told by your health care provider. You may be instructed to keep the wound dry for the first few days. If you are told to clean the wound: ? Wash the wound with mild soap and water or a salt-water (saline) solution. ? Rinse the wound with water to remove all soap. ? Do not rub the wound dry. Let the wound air dry.  Change any dressings as told by your health care provider. This includes changing the dressing if it gets wet, gets dirty, or starts to smell bad.  Do not scratch or pick at the wound.  Protect the injured area until it has healed.  Check your wound every day for signs of infection. Check for: ? More redness, swelling, or pain. ? More fluid or blood. ? Warmth. ? Pus or a bad smell. Medicines   Take over-the-counter and prescription medicines only as told by your health care provider.  If you were prescribed an antibiotic medicine, take or apply it as told by your health care provider. Do not stop using the antibiotic even if your condition improves. General instructions  Keep the dressing dry as told by your health care provider.  Do not take baths, swim, or do anything that puts your wound underwater until your health care provider approves.  Keep all follow-up visits as told by your health care provider. This is important. Contact a health care provider if:  You have more  redness, swelling, or pain around your wound.  You have more fluid or blood coming from your wound.  Your wound feels warm to the touch.  You have pus or a bad smell coming from your wound. Get help right away if:  You have a red streak that goes away from the skin tear.  You have a fever and chills and your symptoms suddenly get worse. This information is not intended to replace advice given to you by your health care provider. Make sure you discuss any questions you have with your health care provider. Document Released: 07/30/2001 Document Revised: 06/30/2016 Document Reviewed: 09/25/2015 Elsevier Interactive Patient Education  [employer].

## 2018-08-23 DIAGNOSIS — J849 Interstitial pulmonary disease, unspecified: Secondary | ICD-10-CM | POA: Diagnosis not present

## 2018-08-25 ENCOUNTER — Encounter: Payer: Self-pay | Admitting: Family

## 2018-08-25 ENCOUNTER — Ambulatory Visit (INDEPENDENT_AMBULATORY_CARE_PROVIDER_SITE_OTHER): Payer: Medicare Other | Admitting: Family

## 2018-08-25 VITALS — BP 166/80 | HR 71 | Temp 97.9°F | Ht 61.0 in | Wt 117.2 lb

## 2018-08-25 DIAGNOSIS — S81802D Unspecified open wound, left lower leg, subsequent encounter: Secondary | ICD-10-CM | POA: Diagnosis not present

## 2018-08-25 DIAGNOSIS — T148XXA Other injury of unspecified body region, initial encounter: Secondary | ICD-10-CM

## 2018-08-25 DIAGNOSIS — L089 Local infection of the skin and subcutaneous tissue, unspecified: Secondary | ICD-10-CM

## 2018-08-25 DIAGNOSIS — Z23 Encounter for immunization: Secondary | ICD-10-CM | POA: Diagnosis not present

## 2018-08-25 DIAGNOSIS — Z5189 Encounter for other specified aftercare: Secondary | ICD-10-CM

## 2018-08-25 NOTE — Progress Notes (Signed)
   Subjective:    Patient ID: Susan Fitzgerald, female    DOB: May 18, 1932, 84 y.o.   MRN: [id number]  Chief Complaint  Patient presents with  . recheck wound on lower left leg    PT presents to the office today to recheck skin tear. She has completed her keflex and doxycycline yesterday. She reports hier wound is doing much better.  Wound Check  She was originally treated more than 14 days ago. Previous treatment included oral antibiotics and wound cleansing or irrigation. Her temperature was unmeasured prior to arrival. There has been clear and colored discharge from the wound. The redness has improved. The swelling has improved. There is no pain present.      Review of Systems  Skin: Positive for wound.  All other systems reviewed and are negative.      Objective:   Physical Exam  Constitutional: She appears well-developed and well-nourished.  Cardiovascular: Normal rate, regular rhythm, normal heart sounds and intact distal pulses.  Pulmonary/Chest: Effort normal and breath sounds normal.  Skin: Skin is warm.      Area clean, nonadherent dressing applied, then gauze cling. Pt tolerated well.  BP (!) 166/80   Pulse 71   Temp 97.9 F (36.6 C) (Oral)   Ht 5\' 1"  (1.549 m)   Wt 117 lb 3.2 oz (53.2 kg)   BMI 22.14 kg/m      Assessment & Plan:  DEBERA STERBA comes in today with chief complaint of recheck wound on lower left leg   Diagnosis and orders addressed:  1. Visit for wound check  2. Infected skin tear Improved  3. Encounter for immunization - Flu vaccine HIGH DOSE PF  Continue dressing changes daily Report any increase redness, warmth, discharge, or pain RTO in 1 week for dressing change and wound check   Evelina Dun, FNP

## 2018-08-25 NOTE — Patient Instructions (Signed)
Skin Tear Care A skin tear is a wound in which the top layers of skin have peeled off the deeper skin or tissues underneath them. This is a common problem as people get older because the skin becomes thinner and more fragile. In addition, some medicines, such as oral corticosteroids, can lead to skin thinning if they are taken for long periods of time. A skin tear is often repaired with tape or skin adhesive strips. Depending on the location of the wound, a bandage (dressing) may be applied over the tape or skin adhesive strips. Follow these instructions at home: Wound care  Clean the wound as told by your health care provider. You may be instructed to keep the wound dry for the first few days. If you are told to clean the wound: ? Wash the wound with mild soap and water or a salt-water (saline) solution. ? Rinse the wound with water to remove all soap. ? Do not rub the wound dry. Let the wound air dry.  Change any dressings as told by your health care provider. This includes changing the dressing if it gets wet, gets dirty, or starts to smell bad.  Do not scratch or pick at the wound.  Protect the injured area until it has healed.  Check your wound every day for signs of infection. Check for: ? More redness, swelling, or pain. ? More fluid or blood. ? Warmth. ? Pus or a bad smell. Medicines   Take over-the-counter and prescription medicines only as told by your health care provider.  If you were prescribed an antibiotic medicine, take or apply it as told by your health care provider. Do not stop using the antibiotic even if your condition improves. General instructions  Keep the dressing dry as told by your health care provider.  Do not take baths, swim, or do anything that puts your wound underwater until your health care provider approves.  Keep all follow-up visits as told by your health care provider. This is important. Contact a health care provider if:  You have more  redness, swelling, or pain around your wound.  You have more fluid or blood coming from your wound.  Your wound feels warm to the touch.  You have pus or a bad smell coming from your wound. Get help right away if:  You have a red streak that goes away from the skin tear.  You have a fever and chills and your symptoms suddenly get worse. This information is not intended to replace advice given to you by your health care provider. Make sure you discuss any questions you have with your health care provider. Document Released: 07/30/2001 Document Revised: 06/30/2016 Document Reviewed: 09/25/2015 Elsevier Interactive Patient Education  [employer].

## 2018-09-02 ENCOUNTER — Other Ambulatory Visit: Payer: Self-pay | Admitting: Family Medicine

## 2018-09-03 ENCOUNTER — Ambulatory Visit (INDEPENDENT_AMBULATORY_CARE_PROVIDER_SITE_OTHER): Payer: Medicare Other | Admitting: Family

## 2018-09-03 ENCOUNTER — Encounter: Payer: Self-pay | Admitting: Family

## 2018-09-03 VITALS — BP 157/80 | HR 71 | Temp 97.0°F | Ht 61.0 in | Wt 118.2 lb

## 2018-09-03 DIAGNOSIS — I10 Essential (primary) hypertension: Secondary | ICD-10-CM | POA: Diagnosis not present

## 2018-09-03 DIAGNOSIS — Z5189 Encounter for other specified aftercare: Secondary | ICD-10-CM

## 2018-09-03 NOTE — Progress Notes (Signed)
   Subjective:    Patient ID: Susan Fitzgerald, female    DOB: 1932/01/22, 84 y.o.   MRN: [id number]  Chief Complaint  Patient presents with  . recheck wound on leg  . Hypertension    Wound Check  She was originally treated more than 14 days ago. Her temperature was unmeasured prior to arrival. There has been no drainage from the wound. The redness has improved. The swelling has improved. The pain has improved.  Hypertension  This is a chronic problem. The current episode started more than 1 year ago. The problem has been waxing and waning since onset. The problem is uncontrolled. Associated symptoms include peripheral edema. Pertinent negatives include no headaches, malaise/fatigue or shortness of breath. Past treatments include ACE inhibitors, calcium channel blockers and diuretics. The current treatment provides moderate improvement. Hypertensive end-organ damage includes heart failure.      Review of Systems  Constitutional: Negative for malaise/fatigue.  Respiratory: Negative for shortness of breath.   Skin: Positive for wound.  Neurological: Negative for headaches.  All other systems reviewed and are negative.      Objective:   Physical Exam  Constitutional: She is oriented to person, place, and time. She appears well-developed and well-nourished. No distress.  HENT:  Head: Normocephalic.  Eyes: Pupils are equal, round, and reactive to light.  Neck: Normal range of motion. Neck supple. No thyromegaly present.  Cardiovascular: Normal rate, regular rhythm, normal heart sounds and intact distal pulses.  No murmur heard. Pulmonary/Chest: Effort normal and breath sounds normal. No respiratory distress. She has no wheezes.  Abdominal: Soft. Bowel sounds are normal. She exhibits no distension. There is no tenderness.  Musculoskeletal: Normal range of motion. She exhibits no edema or tenderness.  Neurological: She is alert and oriented to person, place, and time. She has normal  reflexes. No cranial nerve deficit.  Skin: Skin is warm and dry.  Psychiatric: She has a normal mood and affect. Her behavior is normal. Judgment and thought content normal.  Vitals reviewed.       BP (!) 157/80   Pulse 71   Temp (!) 97 F (36.1 C) (Oral)   Ht 5\' 1"  (1.549 m)   Wt 118 lb 3.2 oz (53.6 kg)   BMI 22.33 kg/m      Assessment & Plan:  Susan Fitzgerald comes in today with chief complaint of recheck wound on leg and Hypertension   Diagnosis and orders addressed:  1. Encounter for wound care Continues to improve!! Decreased swelling, pain, and redness. Return to office if having any fevers, discharge, swelling, or increased pain  2. Essential hypertension Continue medications and keep Cardiologists appt  Evelina Dun, FNP

## 2018-09-03 NOTE — Patient Instructions (Signed)

## 2018-09-07 DIAGNOSIS — M5030 Other cervical disc degeneration, unspecified cervical region: Secondary | ICD-10-CM | POA: Diagnosis not present

## 2018-09-07 DIAGNOSIS — M5136 Other intervertebral disc degeneration, lumbar region: Secondary | ICD-10-CM | POA: Diagnosis not present

## 2018-09-07 DIAGNOSIS — M150 Primary generalized (osteo)arthritis: Secondary | ICD-10-CM | POA: Diagnosis not present

## 2018-09-07 DIAGNOSIS — M0579 Rheumatoid arthritis with rheumatoid factor of multiple sites without organ or systems involvement: Secondary | ICD-10-CM | POA: Diagnosis not present

## 2018-09-08 ENCOUNTER — Telehealth: Payer: Self-pay | Admitting: Pulmonary Disease

## 2018-09-08 NOTE — Telephone Encounter (Signed)
PCCM:  Yes, that would be good. Lets see her about 3-4 weeks into her rehab and making sure she is doing ok. Unless she needs to see [place] for any other reason.   Thanks  Garner Nash, DO [facility] 09/08/2018 7:47 PM

## 2018-09-08 NOTE — Telephone Encounter (Signed)
Called and spoke to pt.  Pt states she will start pulmonary rehab on 10/13/18.  Pt has a pending OV with Dr. Valeta Harms on 10/12/18. Pt is wanting to know if office visit should be after she starts pulmonary rehab.  Dr.Icard please advise. Thanks

## 2018-09-09 DIAGNOSIS — R05 Cough: Secondary | ICD-10-CM | POA: Diagnosis not present

## 2018-09-09 DIAGNOSIS — J849 Interstitial pulmonary disease, unspecified: Secondary | ICD-10-CM | POA: Diagnosis not present

## 2018-09-09 DIAGNOSIS — R0602 Shortness of breath: Secondary | ICD-10-CM | POA: Diagnosis not present

## 2018-09-09 MED ORDER — AEROCHAMBER MV MISC: 0 refills | Status: DC

## 2018-09-09 MED ORDER — BUDESONIDE-FORMOTEROL FUMARATE 80-4.5 MCG/ACT IN AERO: 2.0000 | INHALATION_SPRAY | Freq: Two times a day (BID) | RESPIRATORY_TRACT | 0 refills | Status: DC

## 2018-09-09 NOTE — Telephone Encounter (Signed)
Called and spoke with pt letting her know that BI wanted her to be seen at least 3-4 weeks after she had been in pulmonary rehab to make sure things have been doing okay with her.  Pt expressed understanding. I rescheduled pt's appt with BI to 11/25/18 at 10am.  Dr. Valeta Harms, please advise if there are any meds, inhalers, etc that you would like pt to be prescribed in the mean time to help with her SOB and coughing. Pt states she is still having the SOB with exertion and when she is going up stairs.  Pt also states she does cough mainly at night and states it is hard for her to get the phlegm up as it feels like it is "stuck in throat"

## 2018-09-09 NOTE — Telephone Encounter (Signed)
PCCM: Can we give her samples of symbicort 80, 2 puffs bid with a spacer? Thanks BLI   Garner Nash, DO  Pulmonary Critical Care 09/09/2018 9:23 AM

## 2018-09-09 NOTE — Telephone Encounter (Signed)
Samples placed up front, spacer prescription printed and spacer placed up front. Patient is aware nothing further needed.

## 2018-09-10 ENCOUNTER — Telehealth: Payer: Self-pay | Admitting: Pulmonary Disease

## 2018-09-10 NOTE — Telephone Encounter (Signed)
Patient wanted to know how often she needed to take Symbicort 160. I advised her to take 2 puffs BID. I also advised her on how to use her Aero chamber. She verbalized understanding nothing further needed at this time.

## 2018-09-14 ENCOUNTER — Ambulatory Visit (HOSPITAL_COMMUNITY): Payer: Medicare Other

## 2018-09-23 DIAGNOSIS — J849 Interstitial pulmonary disease, unspecified: Secondary | ICD-10-CM | POA: Diagnosis not present

## 2018-09-28 ENCOUNTER — Telehealth: Payer: Self-pay | Admitting: Pulmonary Disease

## 2018-09-28 DIAGNOSIS — R0602 Shortness of breath: Secondary | ICD-10-CM

## 2018-09-28 NOTE — Telephone Encounter (Signed)
Left message for patient to call back  

## 2018-09-29 ENCOUNTER — Ambulatory Visit (INDEPENDENT_AMBULATORY_CARE_PROVIDER_SITE_OTHER): Payer: Medicare Other

## 2018-09-29 ENCOUNTER — Telehealth: Payer: Self-pay | Admitting: Family Medicine

## 2018-09-29 ENCOUNTER — Other Ambulatory Visit: Payer: Self-pay | Admitting: Family Medicine

## 2018-09-29 DIAGNOSIS — M810 Age-related osteoporosis without current pathological fracture: Secondary | ICD-10-CM | POA: Diagnosis not present

## 2018-09-29 DIAGNOSIS — Z1231 Encounter for screening mammogram for malignant neoplasm of breast: Secondary | ICD-10-CM | POA: Diagnosis not present

## 2018-09-29 LAB — HM MAMMOGRAPHY

## 2018-09-29 NOTE — Telephone Encounter (Signed)
PCCM:  Yes on occasion it could. If she is feeling worse has worse SOB or signs of infection she may need a CXR. Could be seen by NP for an acute visit is she feels is necessary.   Thanks  Garner Nash, DO [facility] 09/29/2018 12:20 PM

## 2018-09-29 NOTE — Telephone Encounter (Signed)
Called and spoke to patient, made aware of BI recommendations, voiced understanding. Appt made for tomorrow. Order for CXR placed stat for tomorrow. Nothing further needed at this time.

## 2018-09-29 NOTE — Telephone Encounter (Signed)
Made appt for patient.  

## 2018-09-29 NOTE — Telephone Encounter (Signed)
Called and spoke with pt who stated she has had phlegm in her throat x1 month and states she has to clear her throat a lot. Pt states she is not coughing up the phlegm but wants to know if this could contribute to the fluid in her lungs.  Pt states she is on a new medication for her rheumatoid arthritis which she states she has been on it x1 month now. Pt states the new medication she is on is Leflunomide 10mg  which she takes it once daily.  Pt states she still has not been using her O2 but states she still has it at her home.  Dr. Valeta Harms, please advise on this for pt. Thanks!

## 2018-09-29 NOTE — Progress Notes (Signed)
_0  ID: [name], female    DOB: 06/01/1932, 84 y.o.   MRN: [id number]  Chief Complaint  Patient presents with  . Acute Visit    Cough, more mucous, cxry before     Referring provider: Chipper Herb, MD  HPI:  84 year old female never smoker initially referred to our office on 08/10/18.  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  PMH: A. fib (placed on flecainide developed prolonged QTC, CHF (EF 55-60 percent), RA (methotrexate on hold right now due to elevated creatinine), 01/2016: +RF, +anti-CCP, + elevated ESR Smoker/ Smoking History: Never smoker Maintenance:  Symbicort 80 Pt of: Icard  Recent [facility] Encounters:   08/10/2018-office visit-Icard OV 08/10/2018: She was started on tapering prednisone dose at her last visit.  Unfortunately on 07/26/2018 patient suffered a fall as well as a large skin tear along the left anterior tibia.  She recently completed her last dose of prednisone this past Thursday.  She is now feeling like she is almost back to normal.  She does feel little weak.  She had no longer been using her oxygen as she is not felt like she is needed it.  She has been checking her pulse ox at home all of which have been in the mid 90s.  She denies dyspnea on exertion and shortness of breath cough or hemoptysis. Plan: Enrolling cardio pulmonary rehab, plan to do high-res CT in 6 months to a year, follow-up in 2 months, continue oxygen therapy with exertion  09/30/2018  - Visit   84 year old female presented today for acute visit.  Patient contacted our office reporting worsening cough.  As well as increased mucus.  Patient reports that she feels like she needs to clear her throat.  She does not report a color to her mucus that she has not visualized it.  Patient has had difficulties mobilizing secretions.  Patient is currently on prednisone under the guidance of her rheumatologist.  Patient does admit today that  she was not taking her Symbicort 82 puffs twice daily.  She is taking 1 puff daily.  Patient reports that her symptoms have been okay since last office visit.  She still has not started pulmonary rehab.  She reports she has an orientation later on this month.  Patient reports she will be completing this anti-pen.  As this is closer to her.  She does admit that she was able to go out and hit golf balls in her yard this was the first time she had played golf since the summer.  Chest x-ray today shows no pneumonia.   Patient reports that oxygen levels have been stable at home and she is been maintaining oxygen saturations greater than 90% has not been needing to use her oxygen.    Tests:  Chest Imaging: CT imaging from 2017 with basilar intersitial changes  CXR from 05/2018  with bilateral prominent interstitial markings.   09/30/2018-chest x-ray-cardiomegaly, chronic interstitial prominence throughout both lungs likely fibrosis, no active acute disease at this time  High-resolution CT from August 2019 Evidence of interlobular septal thickening and patchy ground glass opacities.  There is evidence in the upper lobes as well as the lower lobes however there is small amounts of bilateral pleural fluid.  This could represent pulmonary edema and decompensation of her known heart failure.  However the upper lobe predominance and is also seen more in the right than the left.  This could represent drug-induced disease.  Pulmonary Functions Testing Results: 07/07/2018 TLC - 56%  6MWT: Distance 288 meters, 2LNC   Echocardiogram: 01/27/2018 -EF in the range of 55 to 78%, grade 1 diastolic dysfunction, mild LVH   FENO:  No results found for: NITRICOXIDE  PFT: No flowsheet data found.  Imaging: Dg Chest 2 View  Result Date: 09/30/2018 CLINICAL DATA:  Chest congestion, shortness of breath EXAM: CHEST - 2 VIEW COMPARISON:  06/11/2018 FINDINGS: Cardiomegaly. Diffuse interstitial prominence  throughout the lungs, most pronounced in the lung bases, favor chronic lung disease/fibrosis. No confluent airspace opacities or effusions. Severe compression deformity in a lower thoracic vertebral body, stable. IMPRESSION: Cardiomegaly. Chronic interstitial prominence throughout the lungs, most pronounced in the lung bases, likely fibrosis. No active disease. Electronically Signed   By: Rolm Baptise M.D.   On: 09/30/2018 09:42   Dg Wrfm Dexa  Result Date: 09/29/2018 EXAM: DUAL X-RAY ABSORPTIOMETRY (DXA) FOR BONE MINERAL DENSITY IMPRESSION: Your patient Deija Buhrman completed a BMD test on 09/29/2018 using the [employer] (software version: 17 SP 1) manufactured by [employer]. The following summarizes the results of our evaluation.Tech:JSP PATIENT BIOGRAPHICAL: Name: Tanaya, Dunigan Patient ID: [id number] Birth Date: [birth year] Height: 61.0 in. Gender: Female Exam Date: 09/29/2018 Weight: 118.2 lbs. Indications: Advanced Age, Caucasian, Osteoporotic Fractures: Pelvis Treatments: Calcium, Vitamin D DENSITOMETRY RESULTS: Site Region Meas'd Date Meas'd Age WHO Class. YA T-score BMD %Chg Prev. Sig. Chg (*) Left Femur Neck 09/29/[age] 84.4 [age] Osteoporosis -2.6 0.672 g/cm2 -1.0% Left Femur Neck 07/30/2016 82.3 [age] Osteoporosis -2.6 0.679 g/cm2 -1.2% Left Femur Neck 04/20/2014 80.0 [age] Osteoporosis -2.5 0.687 g/cm2 -0.1% Left Femur Neck 04/15/2012 78.0 [age] Osteoporosis -2.5 0.688 g/cm2 1.0% Left Femur Neck 10/04/2009 75.7 years Osteoporosis -2.6 0.681 g/cm2 4.1% Left Femur Neck 08/10/2007 73.3 [age] Osteoporosis -2.8 0.654 g/cm2 - Left Forearm Radius 33% 09/29/2018 84.4 [age] Osteopenia -1.4 0.763 g/cm2 8.4% * Left Forearm Radius 33% 07/30/2016 82.3 [age] Osteopenia -2.1 0.704 g/cm2 - - ASSESSMENT: The BMD measured at Femur Neck is 0.672 g/cm2 with a T-score of -2.6. This patient is considered osteoporotic according to [facility]) criteria. Compared with  the prior study on 07/31/2016 the BMD of the femoral neck shows no statistically significant change . The scan quality is good. Exclusion: L-spine (degenerative changes,) Rt hip (surgical hardware) - World Health Organization [facility]) criteria for post-menopausal, Caucasian Women: Normal:       T-score at or above -1 SD Osteopenia:   T-score between -1 and -2.5 SD Osteoporosis: T-score at or below -2.5 SD - RECOMMENDATIONS: 1. All patients should optimize calcium and vitamin D intake. 2. Consider FDA-approved medical therapies in postmenopausal women and men aged 98 years and older, based on the following: a. Hip or vertebral (clinical or morphometric ) fracture. b. T-score < -2.5 at the femoral neck or spine after appropriate evaluation to exclude secondary causes c.Low bone mass (T-score between -1.0 and -2.5 at the femoral neck or spine) and a 10 -year probability of a hip fracture > 3% or a 10- year probability of a major osteoporosis- related fracture>20% based on the [place]-adapted WHO algorithm d. Clinician judgment and/or patient preferences may inidcate treatment for people with 10 -year fracture probabilities above or below these levels. - FOLLOW-UP: People with diagnosed cases of osteoporosis or at high risk for fracture should have regular bone mineral density tests. For patients eligible for Medicare, routine testing is allowed once every 2 years. The testing frequency can be increased to  one year for patients who have rapidly progressing disease, those who are receiving or discontinuing medical therapy to restore bone mass, or have additional risk factors. I have reviewed this report, and agree with the above findings. [facility] - Electronically Signed   By: Lowella Grip III M.D.   On: 09/29/2018 11:41    Chart Review:    Specialty Problems      Pulmonary Problems   Shortness of breath   Calcification of bronchial airway    01/2017 - See on CT imaging of the chest        Interstitial pulmonary disease (HCC)    High-resolution CT from August 2019 Evidence of interlobular septal thickening and patchy ground glass opacities.  There is evidence in the upper lobes as well as the lower lobes however there is small amounts of bilateral pleural fluid.  This could represent pulmonary edema and decompensation of her known heart failure.  However the upper lobe predominance and is also seen more in the right than the left.  This could represent drug-induced disease.       Cough      Allergies  Allergen Reactions  . Benazepril Hcl Cough    Immunization History  Administered Date(s) Administered  . Influenza, High Dose Seasonal PF 09/20/2016, 09/19/2017, 08/25/2018  . Influenza,inj,Quad PF,6+ Mos 08/23/2013, 09/05/2014, 08/28/2015  . Pneumococcal Conjugate-13 09/05/2014    Past Medical History:  Diagnosis Date  . Arthritis   . Bradycardia 2014  . Calcification of bronchial airway 06/23/2018   01/2017 - See on CT imaging of the chest   . Cataract   . Cerebral vascular disease 06/19/2018   06/19/2018 - MRI with chronic microvascular ischemic change  . Diverticulosis   . Dyspnea    periodically  . Dysrhythmia    a - fib  . Heart murmur    ??? bruit  . History of stress test    a. 04/09/13-nuclear stress-no ischemia low risk  . Hypertension   . Menopause   . Osteoporosis   . PAF (paroxysmal atrial fibrillation) ([place])    a. s/p prior DCCV; b. On flecainide & coumadin (CHA2DS2VASc = 4);  c. 03/2016 Echo: EF 55-60%, mod LVH, mod AI, mild to mod TR, PASP 5mHg.  .Marland KitchenPelvic fracture (HCC)     Tobacco History: Social History   Tobacco Use  Smoking Status Never Smoker  Smokeless Tobacco Never Used   Counseling given: Yes  Continue to not smoke  Outpatient Encounter Medications as of 09/30/2018  Medication Sig  . apixaban (ELIQUIS) 2.5 MG TABS tablet Take 2.5 mg by mouth 2 (two) times daily. As directed   Currently on hold  . budesonide-formoterol  (SYMBICORT) 80-4.5 MCG/ACT inhaler Inhale 2 puffs into the lungs 2 (two) times daily.  . calcium-vitamin D (OSCAL WITH D) 250-125 MG-UNIT per tablet Take 1 tablet by mouth 2 (two) times daily.  .Marland Kitchendiltiazem (CARDIZEM CD) 120 MG 24 hr capsule Take 1 capsule (120 mg total) by mouth daily.  . folic acid (FOLVITE) 1 MG tablet Take 2 mg by mouth daily.   . furosemide (LASIX) 20 MG tablet Take 1 tablet (20 mg total) by mouth daily.  .Marland Kitchengabapentin (NEURONTIN) 100 MG capsule Take 100 mg by mouth at bedtime.   .Marland Kitchenlevothyroxine (SYNTHROID, LEVOTHROID) 75 MCG tablet TAKE 1 TABLET BY MOUTH  DAILY  . lisinopril (PRINIVIL,ZESTRIL) 40 MG tablet TAKE 1 TABLET BY MOUTH  DAILY  . Melatonin 5 MG TABS Take 5 mg by mouth  at bedtime.  . Multiple Vitamin (MULTIVITAMIN) capsule Take 1 capsule by mouth daily.  . potassium chloride (K-DUR) 10 MEQ tablet Take 10 mEq by mouth daily.  . predniSONE (STERAPRED UNI-PAK 21 TAB) 10 MG (21) TBPK tablet Take 10 mg by mouth. Taper dose- 5-6 days left on taper, unsure of exact dose 09/30/18  . Spacer/Aero-Holding Chambers (AEROCHAMBER MV) inhaler Use as instructed   No facility-administered encounter medications on file as of 09/30/2018.      Review of Systems  Review of Systems  Constitutional: Positive for fatigue. Negative for chills, fever and unexpected weight change.  HENT: Negative for congestion, ear pain, postnasal drip, sinus pressure and sinus pain.   Respiratory: Positive for cough. Negative for chest tightness, shortness of breath and wheezing.        Feels that she has increased sputum production  Cardiovascular: Negative for chest pain and palpitations.  Gastrointestinal: Negative for blood in stool, diarrhea, nausea and vomiting.  Genitourinary: Negative for dysuria, frequency and urgency.  Musculoskeletal: Negative for arthralgias.  Skin: Negative for color change.  Allergic/Immunologic: Negative for environmental allergies and food allergies.  Neurological:  Negative for dizziness, light-headedness and headaches.  Psychiatric/Behavioral: Negative for dysphoric mood. The patient is not nervous/anxious.   All other systems reviewed and are negative.    Physical Exam  BP (!) 144/86 (BP Location: Left Arm, Cuff Size: Normal)   Pulse 79   Ht _0  (1.549 m)   Wt 119 lb 6.4 oz (54.2 kg)   SpO2 96%   BMI 22.56 kg/m   Wt Readings from Last 5 Encounters:  09/30/18 119 lb 6.4 oz (54.2 kg)  09/03/18 118 lb 3.2 oz (53.6 kg)  08/25/18 117 lb 3.2 oz (53.2 kg)  08/17/18 118 lb 6.4 oz (53.7 kg)  08/14/18 120 lb (54.4 kg)    Physical Exam  Constitutional: She is oriented to person, place, and time and well-developed, well-nourished, and in no distress. No distress.  HENT:  Head: Normocephalic and atraumatic.  Right Ear: Hearing, tympanic membrane, external ear and ear canal normal.  Left Ear: Hearing, tympanic membrane, external ear and ear canal normal.  Nose: Mucosal edema present. Right sinus exhibits no maxillary sinus tenderness and no frontal sinus tenderness. Left sinus exhibits no maxillary sinus tenderness and no frontal sinus tenderness.  Mouth/Throat: Uvula is midline and oropharynx is clear and moist. No oropharyngeal exudate.  Eyes: Pupils are equal, round, and reactive to light.  Neck: Normal range of motion. Neck supple. No JVD present.  Cardiovascular: Normal rate, regular rhythm and normal heart sounds.  Pulmonary/Chest: Effort normal and breath sounds normal. No accessory muscle usage. No respiratory distress. She has no decreased breath sounds. She has no wheezes. She has no rhonchi.  Musculoskeletal: Normal range of motion. She exhibits no edema.  Lymphadenopathy:    She has no cervical adenopathy.  Neurological: She is alert and oriented to person, place, and time. Gait normal.  Skin: Skin is warm and dry. She is not diaphoretic. No erythema.  Psychiatric: Mood, memory, affect and judgment normal.  Nursing note and vitals  reviewed.    Lab Results:  CBC    Component Value Date/Time   WBC 6.8 08/12/2018 1016   WBC 4.8 08/15/2017 1206   RBC 3.47 (L) 08/12/2018 1016   RBC 4.05 08/15/2017 1206   HGB 11.5 08/12/2018 1016   HCT 33.6 (L) 08/12/2018 1016   PLT 207 08/12/2018 1016   MCV 97 08/12/2018 1016   MCH 33.1 (  H) 08/12/2018 1016   MCH 30.6 08/15/2017 1206   MCHC 34.2 08/12/2018 1016   MCHC 32.0 08/15/2017 1206   RDW 13.5 08/12/2018 1016   LYMPHSABS 0.8 08/12/2018 1016   MONOABS 0.8 02/10/2017 0332   EOSABS 0.1 08/12/2018 1016   BASOSABS 0.0 08/12/2018 1016    BMET    Component Value Date/Time   NA 144 08/12/2018 1016   K 3.4 (L) 08/12/2018 1016   CL 101 08/12/2018 1016   CO2 24 08/12/2018 1016   GLUCOSE 78 08/12/2018 1016   GLUCOSE 98 12/30/2017 1030   BUN 15 08/12/2018 1016   CREATININE 1.20 (H) 08/12/2018 1016   CREATININE 0.89 05/31/2013 1148   CALCIUM 8.3 (L) 08/12/2018 1016   GFRNONAA 42 (L) 08/12/2018 1016   GFRNONAA 62 05/31/2013 1148   GFRAA 48 (L) 08/12/2018 1016   GFRAA 71 05/31/2013 1148    BNP    Component Value Date/Time   BNP 175.8 (H) 06/16/2018 1435    ProBNP    Component Value Date/Time   PROBNP 631 01/27/2018 1225      Assessment & Plan:   Pleasant 84 year old female patient completing acute visit today.  Reeducated patient on how to take Symbicort 80 correctly.  This was demonstrated by my nurse.  Patient states understanding.  Patient start Mucinex to help with mobilizing secretions.  Patient does not have pneumonia based on chest x-ray as well as clinical assessment today.   Patient start Mucinex today.  Patient to keep follow-up with our office in January/2020.  Patient to continue on with pulmonary rehab.  Plan to repeat high-res CT sometime in 2020.  Could consider pulmonary function testing in 2020 if patient's symptoms have stabilized.  Rheumatoid arthritis ([place]) Keep follow-up with rheumatology  Interstitial pulmonary disease ([place]) Start  Mucinex >>>1 600 mg tablet every 12 hours  Continue Symbicort 80 >>> 2 puffs in the morning right when you wake up, rinse out your mouth after use, 12 hours later 2 puffs, rinse after use >>> Take this daily, no matter what >>> This is not a rescue inhaler    Please contact our office if your symptoms are not improving  Continue with cardiopulmonary rehab starting later on this month  Keep January/2020 follow-up with Dr. Valeta Harms  Cough Start Mucinex >>>1 600 mg tablet every 12 hours  Please contact our office if your symptoms are not improving  Keep January/2020 follow-up with Dr. Valeta Harms  This appointment was 27 minutes along with over 50% of the time direct face-to-face patient care, assessment, plan of care follow-up.   Lauraine Rinne, NP 09/30/2018

## 2018-09-30 ENCOUNTER — Ambulatory Visit (INDEPENDENT_AMBULATORY_CARE_PROVIDER_SITE_OTHER)
Admission: RE | Admit: 2018-09-30 | Discharge: 2018-09-30 | Disposition: A | Discharge status: Home / Self Care | Payer: Medicare Other | Source: Ambulatory Visit | Attending: Pulmonary Disease | Admitting: Pulmonary Disease

## 2018-09-30 ENCOUNTER — Encounter: Payer: Self-pay | Admitting: Pulmonary Disease

## 2018-09-30 ENCOUNTER — Ambulatory Visit: Payer: Medicare Other | Admitting: Pulmonary Disease

## 2018-09-30 DIAGNOSIS — R059 Cough, unspecified: Secondary | ICD-10-CM | POA: Insufficient documentation

## 2018-09-30 DIAGNOSIS — R05 Cough: Secondary | ICD-10-CM | POA: Diagnosis not present

## 2018-09-30 DIAGNOSIS — M069 Rheumatoid arthritis, unspecified: Secondary | ICD-10-CM

## 2018-09-30 DIAGNOSIS — J849 Interstitial pulmonary disease, unspecified: Secondary | ICD-10-CM | POA: Diagnosis not present

## 2018-09-30 DIAGNOSIS — R0602 Shortness of breath: Secondary | ICD-10-CM

## 2018-09-30 NOTE — Assessment & Plan Note (Signed)
Start Mucinex >>>1 600 mg tablet every 12 hours  Please contact our office if your symptoms are not improving  Keep January/2020 follow-up with Dr. Valeta Harms

## 2018-09-30 NOTE — Progress Notes (Signed)
PCCM: Agree. Thanks for seeing her. Garner Nash, DO [facility] 09/30/2018 5:23 PM

## 2018-09-30 NOTE — Assessment & Plan Note (Signed)
Start Mucinex >>>1 600 mg tablet every 12 hours  Continue Symbicort 80 >>> 2 puffs in the morning right when you wake up, rinse out your mouth after use, 12 hours later 2 puffs, rinse after use >>> Take this daily, no matter what >>> This is not a rescue inhaler    Please contact our office if your symptoms are not improving  Continue with cardiopulmonary rehab starting later on this month  Keep January/2020 follow-up with Dr. Valeta Harms

## 2018-09-30 NOTE — Patient Instructions (Addendum)
Start Mucinex >>>1 600 mg tablet every 12 hours  Continue Symbicort 80 >>> 2 puffs in the morning right when you wake up, rinse out your mouth after use, 12 hours later 2 puffs, rinse after use >>> Take this daily, no matter what >>> This is not a rescue inhaler    Please contact our office if your symptoms are not improving  Continue with cardiopulmonary rehab starting later on this month  Keep January/2020 follow-up with Dr. Valeta Harms  November/2019 we will be moving! We will no longer be at our [place] location.  Be on the look out for a post card/mailer to let you know we have officially moved.  Our new address and phone number will be:  [address] [place], [place] [address] Telephone number: [phone]  It is flu season:   >>>Remember to be washing your hands regularly, using hand sanitizer, be careful to use around herself with has contact with people who are sick will increase her chances of getting sick yourself. >>> Best ways to protect herself from the flu: Receive the yearly flu vaccine, practice good hand hygiene washing with soap and also using hand sanitizer when available, eat a nutritious meals, get adequate rest, hydrate appropriately   Please contact the office if your symptoms worsen or you have concerns that you are not improving.   Thank you for choosing [facility] for your healthcare, and for allowing [place] to partner with you on your healthcare journey. I am thankful to be able to provide care to you today.   Wyn Quaker FNP-C

## 2018-09-30 NOTE — Assessment & Plan Note (Signed)
Keep follow-up with rheumatology

## 2018-10-01 ENCOUNTER — Telehealth: Payer: Self-pay | Admitting: Family Medicine

## 2018-10-02 ENCOUNTER — Ambulatory Visit: Payer: Medicare Other | Admitting: Cardiology

## 2018-10-03 NOTE — Progress Notes (Signed)
HPI The patient presents for evaluation of atrial fibrillation. She was cardioverted.  However, she developed recurrent atrial fib.  She had her flecainide does reduced because of a prolonged QT interval.  The flecainide was subsequently discontinued as she had reverted to atrial fib.   She has been followed in the atrial fib clinic.  She did describe chest pain and was sent for Susan Fitzgerald which did not suggest ischemia.  She does have a mildly reduced EF (40 - 45% on echo.) Her last echo in March 2019 demonstrated an improved EF of 55 - 60%.    She was started on amiodarone and underwent DCCV.  She called recently with epidsodes of weak spells.  She has been in and out of fib but she does not think that this correlates with her sensation.  She was told over the phone to reduce her pindolol.   Most recently she had increased dyspnea and decreased O2 sats.  She was treated for pneumonia.  She had a mildly elevated BNP level and was seen in our office earlier this month and referred to pulmonary.  A CT was done.  There was a thought that she might have an amiodarone pulmonary process.  Other processes could not be excluded such as a reaction to her methotrexate.  There was also suggestion of pulmonary edema.  She has been treated with diuresis.   She did have an increased creat but this is now improved.   She was treated with steroids and did improve.  CXR done last week demonstrated probable diffuse fibrosis.     Since she was last seen she is felt a little bit better.  She even swelling a few golf balls in her yard not long ago.  She has some weakness and still finds it somewhat difficult to climb the stairs but she is not having any distress.  Her breathing is much better.  She is on oxygen.  She has no PND or orthopnea.  She has no palpitations, presyncope or syncope.  She is about to start cardiac rehab.   Allergies  Allergen Reactions  . Benazepril Hcl Cough    Current Outpatient  Medications  Medication Sig Dispense Refill  . apixaban (ELIQUIS) 2.5 MG TABS tablet Take 2.5 mg by mouth 2 (two) times daily. As directed   Currently on hold    . budesonide-formoterol (SYMBICORT) 80-4.5 MCG/ACT inhaler Inhale 2 puffs into the lungs 2 (two) times daily. 1 Inhaler 0  . calcium-vitamin D (OSCAL WITH D) 250-125 MG-UNIT per tablet Take 1 tablet by mouth 2 (two) times daily.    Marland Kitchen diltiazem (DILACOR XR) 180 MG 24 hr capsule Take 180 mg by mouth daily.    . folic acid (FOLVITE) 1 MG tablet Take 2 mg by mouth daily.     . furosemide (LASIX) 20 MG tablet Take 1 tablet (20 mg total) by mouth daily. 180 tablet 1  . gabapentin (NEURONTIN) 100 MG capsule Take 100 mg by mouth at bedtime.     . LEFLUNOMIDE PO Take 1 tablet by mouth daily.    Marland Kitchen levothyroxine (SYNTHROID, LEVOTHROID) 75 MCG tablet TAKE 1 TABLET BY MOUTH  DAILY 90 tablet 3  . Melatonin 5 MG TABS Take 5 mg by mouth at bedtime.    . Multiple Vitamin (MULTIVITAMIN) capsule Take 1 capsule by mouth daily.    . potassium chloride (K-DUR) 10 MEQ tablet Take 10 mEq by mouth daily.    . predniSONE (STERAPRED UNI-PAK  21 TAB) 10 MG (21) TBPK tablet Take 10 mg by mouth. Taper dose- 5-6 days left on taper, unsure of exact dose 09/30/18    . Spacer/Aero-Holding Chambers (AEROCHAMBER MV) inhaler Use as instructed 1 each 0  . lisinopril-hydrochlorothiazide (PRINZIDE,ZESTORETIC) 20-12.5 MG tablet Take 1 tablet by mouth daily. 90 tablet 3   No current facility-administered medications for this visit.     Past Medical History:  Diagnosis Date  . Arthritis   . Bradycardia 2014  . Calcification of bronchial airway 06/23/2018   01/2017 - See on CT imaging of the chest   . Cataract   . Cerebral vascular disease 06/19/2018   06/19/2018 - MRI with chronic microvascular ischemic change  . Diverticulosis   . Dyspnea    periodically  . Dysrhythmia    a - fib  . Heart murmur    ??? bruit  . History of stress test    a. 04/09/13-nuclear stress-no  ischemia low risk  . Hypertension   . Menopause   . Osteoporosis   . PAF (paroxysmal atrial fibrillation) ([facility])    a. s/p prior DCCV; b. On flecainide & coumadin (CHA2DS2VASc = 4);  c. 03/2016 Echo: EF 55-60%, mod LVH, mod AI, mild to mod TR, PASP 82mmHg.  Marland Kitchen Pelvic fracture [facility])     Past Surgical History:  Procedure Laterality Date  . BIOPSY BREAST    . BIOPSY BREAST     right & benign  . BREAST SURGERY    . CARDIOVERSION N/A 10/30/2016   Procedure: CARDIOVERSION;  Surgeon: Larey Dresser, MD;  Location: [facility];  Service: Cardiovascular;  Laterality: N/A;  . CARDIOVERSION N/A 08/20/2017   Procedure: CARDIOVERSION;  Surgeon: Jerline Pain, MD;  Location[facility] ENDOSCOPY;  Service: Cardiovascular;  Laterality: N/A;  . CATARACT EXTRACTION    . EYE SURGERY     cataracts  . TEE WITHOUT CARDIOVERSION N/A 10/30/2016   Procedure: TRANSESOPHAGEAL ECHOCARDIOGRAM (TEE);  Surgeon: Larey Dresser, MD;  Location: [facility];  Service: Cardiovascular;  Laterality: N/A;  . TOTAL HIP ARTHROPLASTY Right 01/27/2017   Procedure: TOTAL HIP ARTHROPLASTY ANTERIOR APPROACH;  Surgeon: Rod Can, MD;  Location: [facility];  Service: Orthopedics;  Laterality: Right;    ROS  As stated in the HPI and negative for all other systems.   PHYSICAL EXAM BP 130/80   Pulse 70   Ht 5\' 1"  (1.549 m)   Wt 118 lb 6.4 oz (53.7 kg)   BMI 22.37 kg/m   GENERAL:  Well appearing for her age NECK:  No jugular venous distention, waveform within normal limits, carotid upstroke brisk and symmetric, no bruits, no thyromegaly LUNGS:  Clear to auscultation bilaterally CHEST:  Unremarkable HEART:  PMI not displaced or sustained,S1 and S2 within normal limits, no S3, no S4, no clicks, no rubs, no murmurs ABD:  Flat, positive bowel sounds normal in frequency in pitch, no bruits, no rebound, no guarding, no midline pulsatile mass, no hepatomegaly, no splenomegaly EXT:  2 plus pulses throughout, no edema, no cyanosis no  clubbing   EKG:  NA   Lab Results  Component Value Date   TSH 0.689 06/08/2018   ALT 80 (H) 06/08/2018   AST 78 (H) 06/08/2018   ALKPHOS 63 06/08/2018   BILITOT 0.4 06/08/2018   PROT 5.5 (L) 06/08/2018   ALBUMIN 3.0 (L) 06/08/2018     ASSESSMENT AND PLAN  HTN:     I did review a blood pressure diary.  Her blood pressures are  often in the 160s and average well above the 150s although they fluctuate slightly.  I would like to add a low-dose of hydrochlorothiazide which may help slightly as a diuretic as there was some question of continued vascular congestion on the chest x-ray and I think she needs better blood pressure control.  I would like to not add to the number of pills she is taking some going to try combination therapy with lisinopril HCT.  It does not come to 40/12.5 some benefit is changed with 20/12.5.  She will get a basic metabolic profile in 2 weeks.  She will keep her blood pressure diary.  I might have to restart the additional 20 mg of lisinopril that I am doing away with with this change.   ATRIAL FIB:   Susan Fitzgerald has a CHA2DS2 - VASc score of 4 with a risk of stroke of 4%.    No change in therapy.  DYSPNEA:     This has been handled as above.  I am to keep her on the low dose of diuretic.  She is also having pulmonary follow-up and is actually on prednisone right now because of flare of her arthritis.

## 2018-10-05 ENCOUNTER — Telehealth: Payer: Self-pay | Admitting: Pulmonary Disease

## 2018-10-05 ENCOUNTER — Ambulatory Visit: Payer: Medicare Other | Admitting: Cardiology

## 2018-10-05 ENCOUNTER — Encounter: Payer: Self-pay | Admitting: Cardiology

## 2018-10-05 ENCOUNTER — Telehealth: Payer: Self-pay | Admitting: Cardiology

## 2018-10-05 VITALS — BP 130/80 | HR 70 | Ht 61.0 in | Wt 118.4 lb

## 2018-10-05 DIAGNOSIS — Z79899 Other long term (current) drug therapy: Secondary | ICD-10-CM | POA: Diagnosis not present

## 2018-10-05 DIAGNOSIS — I4819 Other persistent atrial fibrillation: Secondary | ICD-10-CM

## 2018-10-05 DIAGNOSIS — I10 Essential (primary) hypertension: Secondary | ICD-10-CM

## 2018-10-05 DIAGNOSIS — R0602 Shortness of breath: Secondary | ICD-10-CM | POA: Diagnosis not present

## 2018-10-05 MED ORDER — LISINOPRIL-HYDROCHLOROTHIAZIDE 20-12.5 MG PO TABS: 1.0000 | ORAL_TABLET | Freq: Every day | ORAL | 11 refills | Status: DC

## 2018-10-05 MED ORDER — BUDESONIDE-FORMOTEROL FUMARATE 80-4.5 MCG/ACT IN AERO: 2.0000 | INHALATION_SPRAY | Freq: Two times a day (BID) | RESPIRATORY_TRACT | 0 refills | Status: DC

## 2018-10-05 MED ORDER — LISINOPRIL-HYDROCHLOROTHIAZIDE 20-12.5 MG PO TABS: 1.0000 | ORAL_TABLET | Freq: Every day | ORAL | 3 refills | Status: DC

## 2018-10-05 NOTE — Addendum Note (Signed)
Addended by: Vivia Ewing on: 10/05/2018 10:20 AM   Modules accepted: Orders

## 2018-10-05 NOTE — Telephone Encounter (Signed)
FORWARD TO DR Percival Spanish , AND CMA   FOR YOUR  INFORMATION

## 2018-10-05 NOTE — Telephone Encounter (Signed)
Pt c/o medication issue:  1. Name of Medication:   Leflunomide 10 mg   Mucinex 600mg  2x a day am & pm.   2. How are you currently taking this medication (dosage and times per day)?    3. Are you having a reaction (difficulty breathing--STAT)?   4. What is your medication issue? She was just calling to give the dose for the leflunomide.  And that she is also taking mucinex.

## 2018-10-05 NOTE — Patient Instructions (Signed)
Medication Instructions:  STOP- LISINOPRIL 40 MG  START- LISINOPRIL/HCTZ 20-12.5 MG DAILY  If you need a refill on your cardiac medications before your next appointment, please call your pharmacy.  Labwork: BMP HERE IN OUR OFFICE AT [facility]  Take the provided lab slips with you to the lab for your blood draw.     You will NOT need to fast   If you have labs (blood work) drawn today and your tests are completely normal, you will receive your results only by: Marland Kitchen MyChart Message (if you have MyChart) OR . A paper copy in the mail If you have any lab test that is abnormal or we need to change your treatment, we will call you to review the results.  Testing/Procedures: NONE ORDERED   Follow-Up: . You will need a follow up appointment in [place].  At [facility], you and your health needs are our priority.  As part of our continuing mission to provide you with exceptional heart care, we have created designated Provider Care Teams.  These Care Teams include your primary Cardiologist (physician) and Advanced Practice Providers (APPs -  Physician Assistants and Nurse Practitioners) who all work together to provide you with the care you need, when you need it.  Thank you for choosing [facility] at [facility]!!

## 2018-10-05 NOTE — Telephone Encounter (Signed)
Called and spoke to patient, per patient she was told to call back this week to see if we have samples of symbicort. Samples obtained and placed upfront for pick up. Patient voiced understanding. Nothing further is needed at this time.

## 2018-10-05 NOTE — Telephone Encounter (Signed)
Seer other phone note. Sample has been placed upfront for pick up. Nothing further is needed.

## 2018-10-06 NOTE — Telephone Encounter (Signed)
mucinex 600 mg and Leflunomide 10 mg was adding to pt medications list

## 2018-10-08 ENCOUNTER — Encounter (HOSPITAL_COMMUNITY): Payer: Self-pay

## 2018-10-08 ENCOUNTER — Encounter (HOSPITAL_COMMUNITY)
Admission: RE | Admit: 2018-10-08 | Discharge: 2018-10-08 | Disposition: A | Discharge status: Home / Self Care | Payer: Medicare Other | Source: Ambulatory Visit | Attending: Pulmonary Disease | Admitting: Pulmonary Disease

## 2018-10-08 VITALS — BP 160/60 | HR 74 | Ht 61.0 in | Wt 118.6 lb

## 2018-10-08 DIAGNOSIS — J849 Interstitial pulmonary disease, unspecified: Secondary | ICD-10-CM | POA: Insufficient documentation

## 2018-10-08 NOTE — Progress Notes (Signed)
Pulmonary Individual Treatment Plan  Patient Details  Name: Susan Fitzgerald MRN: [id number] Date of Birth: 08/29/1932 Referring Provider:     PULMONARY REHAB OTHER RESP ORIENTATION from 10/08/2018 in [place]  Referring Provider  Icard      Initial Encounter Date:    PULMONARY REHAB OTHER RESP ORIENTATION from 10/08/2018 in [place]  Date  10/08/18      Visit Diagnosis: ILD (interstitial lung disease) ([place])  Patient's Home Medications on Admission:   Current Outpatient Medications:  .  apixaban (ELIQUIS) 2.5 MG TABS tablet, Take 2.5 mg by mouth 2 (two) times daily. As directed   Currently on hold, Disp: , Rfl:  .  budesonide-formoterol (SYMBICORT) 80-4.5 MCG/ACT inhaler, Inhale 2 puffs into the lungs 2 (two) times daily., Disp: 1 Inhaler, Rfl: 0 .  calcium-vitamin D (OSCAL WITH D) 250-125 MG-UNIT per tablet, Take 1 tablet by mouth 2 (two) times daily., Disp: , Rfl:  .  diltiazem (DILACOR XR) 180 MG 24 hr capsule, Take 180 mg by mouth daily., Disp: , Rfl:  .  folic acid (FOLVITE) 1 MG tablet, Take 2 mg by mouth daily. , Disp: , Rfl:  .  furosemide (LASIX) 20 MG tablet, Take 1 tablet (20 mg total) by mouth daily., Disp: 180 tablet, Rfl: 1 .  gabapentin (NEURONTIN) 100 MG capsule, Take 100 mg by mouth at bedtime. , Disp: , Rfl:  .  guaiFENesin (MUCINEX) 600 MG 12 hr tablet, Take 600 mg by mouth 2 (two) times daily., Disp: , Rfl:  .  leflunomide (ARAVA) 10 MG tablet, Take 1 tablet by mouth daily. , Disp: , Rfl:  .  levothyroxine (SYNTHROID, LEVOTHROID) 75 MCG tablet, TAKE 1 TABLET BY MOUTH  DAILY, Disp: 90 tablet, Rfl: 3 .  lisinopril-hydrochlorothiazide (PRINZIDE,ZESTORETIC) 20-12.5 MG tablet, Take 1 tablet by mouth daily., Disp: 90 tablet, Rfl: 3 .  Melatonin 5 MG TABS, Take 5 mg by mouth at bedtime., Disp: , Rfl:  .  Multiple Vitamin (MULTIVITAMIN) capsule, Take 1 capsule by mouth daily., Disp: , Rfl:  .  potassium chloride (K-DUR) 10  MEQ tablet, Take 10 mEq by mouth daily., Disp: , Rfl:  .  predniSONE (STERAPRED UNI-PAK 21 TAB) 10 MG (21) TBPK tablet, Take 10 mg by mouth. Taper dose- 5-6 days left on taper, unsure of exact dose 09/30/18, Disp: , Rfl:  .  Spacer/Aero-Holding Chambers (AEROCHAMBER MV) inhaler, Use as instructed, Disp: 1 each, Rfl: 0  Past Medical History: Past Medical History:  Diagnosis Date  . Arthritis   . Bradycardia 2014  . Calcification of bronchial airway 06/23/2018   01/2017 - See on CT imaging of the chest   . Cataract   . Cerebral vascular disease 06/19/2018   06/19/2018 - MRI with chronic microvascular ischemic change  . Diverticulosis   . Dyspnea    periodically  . Dysrhythmia    a - fib  . Heart murmur    ??? bruit  . History of stress test    a. 04/09/13-nuclear stress-no ischemia low risk  . Hypertension   . Menopause   . Osteoporosis   . PAF (paroxysmal atrial fibrillation) ([place])    a. s/p prior DCCV; b. On flecainide & coumadin (CHA2DS2VASc = 4);  c. 03/2016 Echo: EF 55-60%, mod LVH, mod AI, mild to mod TR, PASP 65mmHg.  Marland Kitchen Pelvic fracture ([facility])     Tobacco Use: Social History   Tobacco Use  Smoking Status Never Smoker  Smokeless Tobacco Never  Used    Labs: Recent Chemical engineer    Labs for ITP Cardiac and Pulmonary Rehab Latest Ref Rng & Units 08/28/2015 12/27/2015 09/16/2016 03/24/2017 09/15/2017   Cholestrol 100 - 199 mg/dL 168 181 149 169 166   LDLCALC 0 - 99 mg/dL 90 - - - -   LDLDIRECT 0 - 99 mg/dL - - - - -   HDL >39 mg/dL 66 66 65 77 71   Trlycerides 0 - 149 mg/dL 60 82 73 55 67      Capillary Blood Glucose: No results found for: GLUCAP   Pulmonary Assessment Scores: Pulmonary Assessment Scores    Row Name 10/08/18 1532         ADL UCSD   ADL Phase  Entry     SOB Score total  28     Rest  0     Walk  5     Stairs  3     Bath  2     Dress  2     Shop  2       CAT Score   CAT Score  10       mMRC Score   mMRC Score  2        Pulmonary  Function Assessment:   Exercise Target Goals: Exercise Program Goal: Individual exercise prescription set using results from initial 6 min walk test and THRR while considering  patient's activity barriers and safety.   Exercise Prescription Goal: Initial exercise prescription builds to 30-45 minutes a day of aerobic activity, 2-3 days per week.  Home exercise guidelines will be given to patient during program as part of exercise prescription that the participant will acknowledge.  Activity Barriers & Risk Stratification: Activity Barriers & Cardiac Risk Stratification - 10/08/18 1504      Activity Barriers & Cardiac Risk Stratification   Activity Barriers  Shortness of Breath    Cardiac Risk Stratification  High       6 Minute Walk: 6 Minute Walk    Row Name 10/08/18 1503         6 Minute Walk   Phase  Initial     Distance  1400 feet     Walk Time  6 minutes     # of Rest Breaks  0     MPH  2.65     METS  3.03     RPE  11     Perceived Dyspnea   9     VO2 Peak  9.04     Symptoms  No     Resting HR  74 bpm     Resting BP  160/60     Resting Oxygen Saturation   96 %     Exercise Oxygen Saturation  during 6 min walk  91 %     Max Ex. HR  102 bpm     Max Ex. BP  148/70     2 Minute Post BP  124/66        Oxygen Initial Assessment: Oxygen Initial Assessment - 10/08/18 1520      Home Oxygen   Sleep Oxygen Prescription  None    Home Exercise Oxygen Prescription  --   Oxygen is prescribed for as needed at 2 L/M. Patient says she is not using at this time.    Home at Rest Exercise Oxygen Prescription  --   Oxygen is prescribed for as needed at 2 L/M. Patient says she is not  using at this time.    Compliance with Home Oxygen Use  Yes      Initial 6 min Walk   Oxygen Used  None      Program Oxygen Prescription   Program Oxygen Prescription  --   As needed at 2 L/M.     Intervention   Short Term Goals  To learn and understand importance of monitoring SPO2 with  pulse oximeter and demonstrate accurate use of the pulse oximeter.;To learn and understand importance of maintaining oxygen saturations>88%;To learn and demonstrate proper pursed lip breathing techniques or other breathing techniques.;To learn and demonstrate proper use of respiratory medications;To learn and exhibit compliance with exercise, home and travel O2 prescription    Long  Term Goals  Exhibits compliance with exercise, home and travel O2 prescription;Verbalizes importance of monitoring SPO2 with pulse oximeter and return demonstration;Maintenance of O2 saturations>88%;Exhibits proper breathing techniques, such as pursed lip breathing or other method taught during program session       Oxygen Re-Evaluation:   Oxygen Discharge (Final Oxygen Re-Evaluation):   Initial Exercise Prescription: Initial Exercise Prescription - 10/08/18 1500      Date of Initial Exercise RX and Referring Provider   Date  10/08/18    Referring Provider  Icard    Expected Discharge Date  01/08/19      Treadmill   MPH  1.2    Grade  0    Minutes  17    METs  1.91      NuStep   Level  1    SPM  70    Minutes  17    METs  2      Prescription Details   Frequency (times per week)  2    Duration  Progress to 30 minutes of continuous aerobic without signs/symptoms of physical distress      Intensity   THRR 40-80% of Max Heartrate  [phone]    Ratings of Perceived Exertion  11-13    Perceived Dyspnea  0-4      Progression   Progression  Continue to progress workloads to maintain intensity without signs/symptoms of physical distress.      Resistance Training   Training Prescription  Yes    Weight  1    Reps  10-15       Perform Capillary Blood Glucose checks as needed.  Exercise Prescription Changes:  Exercise Prescription Changes    Row Name 10/08/18 1500             Home Exercise Plan   Plans to continue exercise at  Home (comment)       Frequency  Add 3 additional days to  program exercise sessions.       Initial Home Exercises Provided  10/08/18          Exercise Comments:   Exercise Goals and Review:  Exercise Goals    Row Name 10/08/18 1506             Exercise Goals   Increase Physical Activity  Yes       Intervention  Provide advice, education, support and counseling about physical activity/exercise needs.;Develop an individualized exercise prescription for aerobic and resistive training based on initial evaluation findings, risk stratification, comorbidities and participant's personal goals.       Expected Outcomes  Short Term: Attend rehab on a regular basis to increase amount of physical activity.       Increase Strength and Stamina  Yes  Intervention  Provide advice, education, support and counseling about physical activity/exercise needs.;Develop an individualized exercise prescription for aerobic and resistive training based on initial evaluation findings, risk stratification, comorbidities and participant's personal goals.       Expected Outcomes  Short Term: Increase workloads from initial exercise prescription for resistance, speed, and METs.       Able to understand and use rate of perceived exertion (RPE) scale  Yes       Intervention  Provide education and explanation on how to use RPE scale       Expected Outcomes  Short Term: Able to use RPE daily in rehab to express subjective intensity level;Long Term:  Able to use RPE to guide intensity level when exercising independently       Able to understand and use Dyspnea scale  Yes       Intervention  Provide education and explanation on how to use Dyspnea scale       Expected Outcomes  Long Term: Able to use Dyspnea scale to guide intensity level when exercising independently;Short Term: Able to use Dyspnea scale daily in rehab to express subjective sense of shortness of breath during exertion       Knowledge and understanding of Target Heart Rate Range (THRR)  Yes       Intervention   Provide education and explanation of THRR including how the numbers were predicted and where they are located for reference       Expected Outcomes  Short Term: Able to state/look up THRR;Long Term: Able to use THRR to govern intensity when exercising independently;Short Term: Able to use daily as guideline for intensity in rehab       Able to check pulse independently  Yes       Intervention  Provide education and demonstration on how to check pulse in carotid and radial arteries.;Review the importance of being able to check your own pulse for safety during independent exercise       Expected Outcomes  Short Term: Able to explain why pulse checking is important during independent exercise;Long Term: Able to check pulse independently and accurately       Understanding of Exercise Prescription  Yes       Intervention  Provide education, explanation, and written materials on patient's individual exercise prescription       Expected Outcomes  Short Term: Able to explain program exercise prescription;Long Term: Able to explain home exercise prescription to exercise independently          Exercise Goals Re-Evaluation :   Discharge Exercise Prescription (Final Exercise Prescription Changes): Exercise Prescription Changes - 10/08/18 1500      Home Exercise Plan   Plans to continue exercise at  Home (comment)    Frequency  Add 3 additional days to program exercise sessions.    Initial Home Exercises Provided  10/08/18       Nutrition:  Target Goals: Understanding of nutrition guidelines, daily intake of sodium 1500mg , cholesterol 200mg , calories 30% from fat and 7% or less from saturated fats, daily to have 5 or more servings of fruits and vegetables.  Biometrics: Pre Biometrics - 10/08/18 1506      Pre Biometrics   Height  5\' 1"  (1.549 m)    Weight  53.8 kg    Waist Circumference  28 inches    Hip Circumference  34.5 inches    Waist to Hip Ratio  0.81 %    BMI (Calculated)  22.42  Triceps Skinfold  14 mm    % Body Fat  31.7 %    Grip Strength  17.03 kg    Flexibility  0 in    Single Leg Stand  1.5 seconds        Nutrition Therapy Plan and Nutrition Goals:   Nutrition Assessments: Nutrition Assessments - 10/08/18 1537      MEDFICTS Scores   Pre Score  32       Nutrition Goals Re-Evaluation:   Nutrition Goals Discharge (Final Nutrition Goals Re-Evaluation):   Psychosocial: Target Goals: Acknowledge presence or absence of significant depression and/or stress, maximize coping skills, provide positive support system. Participant is able to verbalize types and ability to use techniques and skills needed for reducing stress and depression.  Initial Review & Psychosocial Screening: Initial Psych Review & Screening - 10/08/18 [place]?  Yes      Barriers   Psychosocial barriers to participate in program  There are no identifiable barriers or psychosocial needs.      Screening Interventions   Interventions  Encouraged to exercise    Expected Outcomes  Long Term goal: The participant improves quality of Life and PHQ9 Scores as seen by post scores and/or verbalization of changes       Quality of Life Scores: Quality of Life - 10/08/18 1507      Quality of Life   Select  Quality of Life      Quality of Life Scores   Health/Function Pre  20.07 %    Socioeconomic Pre  28.75 %    Psych/Spiritual Pre  24 %    Family Pre  27 %    GLOBAL Pre  23.17 %      Scores of 19 and below usually indicate a poorer quality of life in these areas.  A difference of  2-3 points is a clinically meaningful difference.  A difference of 2-3 points in the total score of the Quality of Life Index has been associated with significant improvement in overall quality of life, self-image, physical symptoms, and general health in studies assessing change in quality of life.   PHQ-9: Recent Review Flowsheet Data    Depression screen [facility]  2/9 10/08/2018 09/03/2018 08/25/2018 08/17/2018 08/14/2018   Decreased Interest 0 0 0 0 0   Down, Depressed, Hopeless 0 0 0 0 0   PHQ - 2 Score 0 0 0 0 0   Altered sleeping 2 - - - -   Tired, decreased energy 1 - - - -   Change in appetite 1 - - - -   Feeling bad or failure about yourself  0 - - - -   Trouble concentrating 0 - - - -   Moving slowly or fidgety/restless 0 - - - -   Suicidal thoughts 0 - - - -   PHQ-9 Score 4 - - - -   Difficult doing work/chores Somewhat difficult - - - -     Interpretation of Total Score  Total Score Depression Severity:  1-4 = Minimal depression, 5-9 = Mild depression, 10-14 = Moderate depression, 15-19 = Moderately severe depression, 20-27 = Severe depression   Psychosocial Evaluation and Intervention: Psychosocial Evaluation - 10/08/18 1536      Psychosocial Evaluation & Interventions   Interventions  Encouraged to exercise with the program and follow exercise prescription;Relaxation education;Stress management education    Comments  Patient has no psychosical barriers identified  at orientation. Her initial QOL score was 23.17 and her PHQ-9 score was 4.    Expected Outcomes  Patient will have no psychosocial barriers identified at [place].     Continue Psychosocial Services   No Follow up required       Psychosocial Re-Evaluation:   Psychosocial Discharge (Final Psychosocial Re-Evaluation):    Education: Education Goals: Education classes will be provided on a weekly basis, covering required topics. Participant will state understanding/return demonstration of topics presented.  Learning Barriers/Preferences: Learning Barriers/Preferences - 10/08/18 1537      Learning Barriers/Preferences   Learning Barriers  None    Learning Preferences  Written Material       Education Topics: How Lungs Work and Diseases: - Discuss the anatomy of the lungs and diseases that can affect the lungs, such as COPD.   Exercise: -Discuss the  importance of exercise, FITT principles of exercise, normal and abnormal responses to exercise, and how to exercise safely.   Environmental Irritants: -Discuss types of environmental irritants and how to limit exposure to environmental irritants.   Meds/Inhalers and oxygen: - Discuss respiratory medications, definition of an inhaler and oxygen, and the proper way to use an inhaler and oxygen.   Energy Saving Techniques: - Discuss methods to conserve energy and decrease shortness of breath when performing activities of daily living.    Bronchial Hygiene / Breathing Techniques: - Discuss breathing mechanics, pursed-lip breathing technique,  proper posture, effective ways to clear airways, and other functional breathing techniques   Cleaning Equipment: - Provides group verbal and written instruction about the health risks of elevated stress, cause of high stress, and healthy ways to reduce stress.   Nutrition I: Fats: - Discuss the types of cholesterol, what cholesterol does to the body, and how cholesterol levels can be controlled.   Nutrition II: Labels: -Discuss the different components of food labels and how to read food labels.   Respiratory Infections: - Discuss the signs and symptoms of respiratory infections, ways to prevent respiratory infections, and the importance of seeking medical treatment when having a respiratory infection.   Stress I: Signs and Symptoms: - Discuss the causes of stress, how stress may lead to anxiety and depression, and ways to limit stress.   Stress II: Relaxation: -Discuss relaxation techniques to limit stress.   Oxygen for Home/Travel: - Discuss how to prepare for travel when on oxygen and proper ways to transport and store oxygen to ensure safety.   Knowledge Questionnaire Score: Knowledge Questionnaire Score - 10/08/18 1538      Knowledge Questionnaire Score   Pre Score  14/18       Core Components/Risk Factors/Patient Goals at  Admission: Personal Goals and Risk Factors at Admission - 10/08/18 1538      Core Components/Risk Factors/Patient Goals on Admission    Weight Management  Weight Maintenance    Improve shortness of breath with ADL's  Yes    Intervention  Provide education, individualized exercise plan and daily activity instruction to help decrease symptoms of SOB with activities of daily living.    Expected Outcomes  Short Term: Improve cardiorespiratory fitness to achieve a reduction of symptoms when performing ADLs;Long Term: Be able to perform more ADLs without symptoms or delay the onset of symptoms    Hypertension  Yes    Intervention  Provide education on lifestyle modifcations including regular physical activity/exercise, weight management, moderate sodium restriction and increased consumption of fresh fruit, vegetables, and low fat dairy, alcohol moderation, and smoking cessation.;Monitor  prescription use compliance.    Expected Outcomes  Short Term: Continued assessment and intervention until BP is < 140/61mm HG in hypertensive participants. < 130/63mm HG in hypertensive participants with diabetes, heart failure or chronic kidney disease.;Long Term: Maintenance of blood pressure at goal levels.    Personal Goal Other  Yes    Personal Goal  Breathe better. Has less SOB with activities. Gain strength and stamina. Get back to playing golf.     Intervention  Patient will attend PR 2 days/week and supplement with exercise 3 days/week.     Expected Outcomes  Patient will complete the program and meet her personal goals.        Core Components/Risk Factors/Patient Goals Review:    Core Components/Risk Factors/Patient Goals at Discharge (Final Review):    ITP Comments:   Comments: Patient arrived for 1st visit/orientation/education at 1230. Patient was referred to PR by Bradly Icard  due to Interstitial pulmonary disease (J84.9). During orientation advised patient on arrival and appointment times what to  wear, what to do before, during and after exercise. Reviewed attendance and class policy. Talked about inclement weather and class consultation policy. Pt is scheduled to return Pulmonary Rehab on 10/13/18 at 10:45. Pt was advised to come to class 15 minutes before class starts. Patient was also given instructions on meeting with the dietician and attending the Family Structure classes. Discussed RPE/Dpysnea scales. Discussed initial THR and how to find their radial and/or carotid pulse. Discussed the initial exercise prescription and how this effects their progress. Pt is eager to get started. Patient participated in warm up stretches followed by light weights and resistance bands. Patient was able to complete 6 minute walk test.  Discussed equipment safety with patient. Took patient pre-anthropometric measurements. Patient finished visit at 1500.

## 2018-10-08 NOTE — Progress Notes (Signed)
Cardiac/Pulmonary Rehab Medication Review by a Pharmacist  Does the patient  feel that his/her medications are working for him/her?  yes  Has the patient been experiencing any side effects to the medications prescribed?  yes  Does the patient measure his/her own blood pressure or blood glucose at home?  yes   Does the patient have any problems obtaining medications due to transportation or finances?   yes  Understanding of regimen: excellent Understanding of indications: excellent Potential of compliance: excellent  Questions asked to Determine Patient Understanding of Medication Regimen:  1. What is the name of the medication?  2. What is the medication used for?  3. When should it be taken?  4. How much should be taken?  5. How will you take it?  6. What side effects should you report?  Understanding Defined as: Excellent: All questions above are correct Good: Questions 1-4 are correct Fair: Questions 1-2 are correct  Poor: 1 or none of the above questions are correct   Pharmacist comments: Mrs. Wainwright presents for pulmonary rehab today and medication review. She is tolerating current regimen except she is concerned with the hair loss she has started to experience. After review of her medication regimen, notice she was recently started leflunomide,09/14/2018,  and is now having alopecia. Leflunomide can cause alopecia in up to 17% of patients(reversible upon discontinuation). She will follow up with her rheumotologist regarding therapy. Can also look at [facility] to ensure levothyroxine dose appropriate, but most likely d/t leflunomide ([place]). She is monitoring her BP and keeps a record. She is most concerned with improving her lung function so she can play golf again without getting winded. Continue current regimen but follow up with rheumotologists for alternative tx.  Thanks for the opportunity to participate in the care of this patient,  Isac Sarna, BS [place], [place]  Pharmacist Pager [phone] 10/08/2018 2:31 PM

## 2018-10-08 NOTE — Progress Notes (Signed)
Daily Session Note  Patient Details  Name: Susan Fitzgerald MRN: [id number] Date of Birth: 10-20-1932 Referring Provider:     PULMONARY REHAB OTHER RESP ORIENTATION from 10/08/2018 in [place]  Referring Provider  Icard      Encounter Date: 10/08/2018  Check In: Session Check In - 10/08/18 1230      Check-In   Supervising physician immediately available to respond to emergencies  See telemetry face sheet for immediately available MD    Location  [facility]    Staff Present  Aundra Dubin, RN, Cory Munch, Exercise Physiologist    Medication changes reported      No    Fall or balance concerns reported     No    Tobacco Cessation  --   Patient has never smoked.    Warm-up and Cool-down  Performed as group-led Higher education careers adviser Performed  Yes    VAD Patient?  No    PAD/SET Patient?  No      Pain Assessment   Currently in Pain?  No/denies    Multiple Pain Sites  No       Capillary Blood Glucose: No results found for this or any previous visit (from the past 24 hour(s)).  Exercise Prescription Changes - 10/08/18 1500      Home Exercise Plan   Plans to continue exercise at  Home (comment)    Frequency  Add 3 additional days to program exercise sessions.    Initial Home Exercises Provided  10/08/18       Social History   Tobacco Use  Smoking Status Never Smoker  Smokeless Tobacco Never Used    Goals Met:  Exercise tolerated well Personal goals reviewed No report of cardiac concerns or symptoms Strength training completed today  Goals Unmet:  Not Applicable  Comments: Orientation visit. Checked out at 1500.   Dr. Sinda Du is Medical Director for [facility] Pulmonary Re[facility].

## 2018-10-09 ENCOUNTER — Encounter: Payer: Medicare Other | Admitting: *Deleted

## 2018-10-12 ENCOUNTER — Ambulatory Visit: Payer: Medicare Other | Admitting: Pulmonary Disease

## 2018-10-13 ENCOUNTER — Encounter (HOSPITAL_COMMUNITY)
Admission: RE | Admit: 2018-10-13 | Discharge: 2018-10-13 | Disposition: A | Discharge status: Home / Self Care | Payer: Medicare Other | Source: Ambulatory Visit | Attending: Pulmonary Disease | Admitting: Pulmonary Disease

## 2018-10-13 DIAGNOSIS — J849 Interstitial pulmonary disease, unspecified: Secondary | ICD-10-CM | POA: Diagnosis not present

## 2018-10-13 NOTE — Progress Notes (Signed)
Daily Session Note  Patient Details  Name: TANEASHA FUQUA MRN: [id number] Date of Birth: [birth year] Referring Provider:     PULMONARY REHAB OTHER RESP ORIENTATION from 10/08/2018 in [place]  Referring Provider  Icard      Encounter Date: 10/13/2018  Check In: Session Check In - 10/13/18 1045      Check-In   Supervising physician immediately available to respond to emergencies  See telemetry face sheet for immediately available MD    Location  [facility]    Staff Present  Benay Pike, Exercise Physiologist;Diane Coad, MS, EP, CHC, Exercise Physiologist    Medication changes reported      Yes    Comments  Pt. taken off of Flunomide.    Fall or balance concerns reported     No    Warm-up and Cool-down  Performed as group-led instruction    Resistance Training Performed  Yes    VAD Patient?  No    PAD/SET Patient?  No      Pain Assessment   Currently in Pain?  No/denies    Pain Score  0-No pain    Multiple Pain Sites  No       Capillary Blood Glucose: No results found for this or any previous visit (from the past 24 hour(s)).    Social History   Tobacco Use  Smoking Status Never Smoker  Smokeless Tobacco Never Used    Goals Met:  Proper associated with RPD/PD & O2 Sat Improved SOB with ADL's Using PLB without cueing & demonstrates good technique Exercise tolerated well No report of cardiac concerns or symptoms Strength training completed today  Goals Unmet:  Not Applicable  Comments: Pt able to follow exercise prescription today without complaint.  Will continue to monitor for progression. Check out 11:45.   Dr. Sinda Du is Medical Director for [facility].

## 2018-10-14 ENCOUNTER — Telehealth: Payer: Self-pay | Admitting: Pulmonary Disease

## 2018-10-14 NOTE — Telephone Encounter (Signed)
Called and spoke with patient, she stated that her PCP was wanting to start her on methotrexate. She was told in the past that this can cause fluid. She is wanting advice from [facility] before she agrees to take this.    BI please advise, patient is aware you are out of the office.

## 2018-10-14 NOTE — Telephone Encounter (Signed)
Called patient, unable to reach left message to give us a call back. 

## 2018-10-14 NOTE — Telephone Encounter (Signed)
Are we sure it's her PCP or Rheum? She has a rheumatologist that manages her RA and she is already on leflunamide. Are they switching? She was just started on the leflunimide from MTX by her rheaumatologist. I would avoid MTX in the treatment of her RA at this point due to lung toxicity risk. She has already had bad ILD that is slowly recovering.   BLI

## 2018-10-15 ENCOUNTER — Encounter (HOSPITAL_COMMUNITY): Payer: Medicare Other

## 2018-10-16 NOTE — Telephone Encounter (Signed)
Attempted to call pt but no answer. Left message for pt to return call. 

## 2018-10-19 DIAGNOSIS — M150 Primary generalized (osteo)arthritis: Secondary | ICD-10-CM | POA: Diagnosis not present

## 2018-10-19 DIAGNOSIS — M0579 Rheumatoid arthritis with rheumatoid factor of multiple sites without organ or systems involvement: Secondary | ICD-10-CM | POA: Diagnosis not present

## 2018-10-19 DIAGNOSIS — M5030 Other cervical disc degeneration, unspecified cervical region: Secondary | ICD-10-CM | POA: Diagnosis not present

## 2018-10-19 DIAGNOSIS — M5136 Other intervertebral disc degeneration, lumbar region: Secondary | ICD-10-CM | POA: Diagnosis not present

## 2018-10-19 NOTE — Telephone Encounter (Signed)
Pt is calling back [phone]

## 2018-10-19 NOTE — Telephone Encounter (Signed)
Called patient, unable to reach left message to give us a call back. 

## 2018-10-19 NOTE — Telephone Encounter (Signed)
Thanks I will be on the look out for the paperwork from Rheumatology.   Garner Nash, DO [facility] 10/19/2018 1:40 PM

## 2018-10-19 NOTE — Progress Notes (Signed)
Pulmonary Individual Treatment Plan  Patient Details  Name: Susan Fitzgerald MRN: [id number] Date of Birth: 01-28-1932 Referring Provider:     PULMONARY REHAB OTHER RESP ORIENTATION from 10/08/2018 in [place]  Referring Provider  Icard      Initial Encounter Date:    PULMONARY REHAB OTHER RESP ORIENTATION from 10/08/2018 in [place]  Date  10/08/18      Visit Diagnosis: ILD (interstitial lung disease) ([place])  Patient's Home Medications on Admission:   Current Outpatient Medications:  .  apixaban (ELIQUIS) 2.5 MG TABS tablet, Take 2.5 mg by mouth 2 (two) times daily. As directed   Currently on hold, Disp: , Rfl:  .  budesonide-formoterol (SYMBICORT) 80-4.5 MCG/ACT inhaler, Inhale 2 puffs into the lungs 2 (two) times daily., Disp: 1 Inhaler, Rfl: 0 .  calcium-vitamin D (OSCAL WITH D) 250-125 MG-UNIT per tablet, Take 1 tablet by mouth 2 (two) times daily., Disp: , Rfl:  .  diltiazem (DILACOR XR) 180 MG 24 hr capsule, Take 180 mg by mouth daily., Disp: , Rfl:  .  folic acid (FOLVITE) 1 MG tablet, Take 2 mg by mouth daily. , Disp: , Rfl:  .  furosemide (LASIX) 20 MG tablet, Take 1 tablet (20 mg total) by mouth daily., Disp: 180 tablet, Rfl: 1 .  gabapentin (NEURONTIN) 100 MG capsule, Take 100 mg by mouth at bedtime. , Disp: , Rfl:  .  guaiFENesin (MUCINEX) 600 MG 12 hr tablet, Take 600 mg by mouth 2 (two) times daily., Disp: , Rfl:  .  leflunomide (ARAVA) 10 MG tablet, Take 1 tablet by mouth daily. , Disp: , Rfl:  .  levothyroxine (SYNTHROID, LEVOTHROID) 75 MCG tablet, TAKE 1 TABLET BY MOUTH  DAILY, Disp: 90 tablet, Rfl: 3 .  lisinopril-hydrochlorothiazide (PRINZIDE,ZESTORETIC) 20-12.5 MG tablet, Take 1 tablet by mouth daily., Disp: 90 tablet, Rfl: 3 .  Melatonin 5 MG TABS, Take 5 mg by mouth at bedtime., Disp: , Rfl:  .  Multiple Vitamin (MULTIVITAMIN) capsule, Take 1 capsule by mouth daily., Disp: , Rfl:  .  potassium chloride (K-DUR) 10  MEQ tablet, Take 10 mEq by mouth daily., Disp: , Rfl:  .  predniSONE (STERAPRED UNI-PAK 21 TAB) 10 MG (21) TBPK tablet, Take 10 mg by mouth. Taper dose- 5-6 days left on taper, unsure of exact dose 09/30/18, Disp: , Rfl:  .  Spacer/Aero-Holding Chambers (AEROCHAMBER MV) inhaler, Use as instructed, Disp: 1 each, Rfl: 0  Past Medical History: Past Medical History:  Diagnosis Date  . Arthritis   . Bradycardia 2014  . Calcification of bronchial airway 06/23/2018   01/2017 - See on CT imaging of the chest   . Cataract   . Cerebral vascular disease 06/19/2018   06/19/2018 - MRI with chronic microvascular ischemic change  . Diverticulosis   . Dyspnea    periodically  . Dysrhythmia    a - fib  . Heart murmur    ??? bruit  . History of stress test    a. 04/09/13-nuclear stress-no ischemia low risk  . Hypertension   . Menopause   . Osteoporosis   . PAF (paroxysmal atrial fibrillation) ([place])    a. s/p prior DCCV; b. On flecainide & coumadin (CHA2DS2VASc = 4);  c. 03/2016 Echo: EF 55-60%, mod LVH, mod AI, mild to mod TR, PASP 9mmHg.  Marland Kitchen Pelvic fracture ([facility])     Tobacco Use: Social History   Tobacco Use  Smoking Status Never Smoker  Smokeless Tobacco Never  Used    Labs: Recent Chemical engineer    Labs for ITP Cardiac and Pulmonary Rehab Latest Ref Rng & Units 08/28/2015 12/27/2015 09/16/2016 03/24/2017 09/15/2017   Cholestrol 100 - 199 mg/dL 168 181 149 169 166   LDLCALC 0 - 99 mg/dL 90 - - - -   LDLDIRECT 0 - 99 mg/dL - - - - -   HDL >39 mg/dL 66 66 65 77 71   Trlycerides 0 - 149 mg/dL 60 82 73 55 67      Capillary Blood Glucose: No results found for: GLUCAP   Pulmonary Assessment Scores: Pulmonary Assessment Scores    Row Name 10/08/18 1532         ADL UCSD   ADL Phase  Entry     SOB Score total  28     Rest  0     Walk  5     Stairs  3     Bath  2     Dress  2     Shop  2       CAT Score   CAT Score  10       mMRC Score   mMRC Score  2        Pulmonary  Function Assessment:   Exercise Target Goals: Exercise Program Goal: Individual exercise prescription set using results from initial 6 min walk test and THRR while considering  patient's activity barriers and safety.   Exercise Prescription Goal: Initial exercise prescription builds to 30-45 minutes a day of aerobic activity, 2-3 days per week.  Home exercise guidelines will be given to patient during program as part of exercise prescription that the participant will acknowledge.  Activity Barriers & Risk Stratification: Activity Barriers & Cardiac Risk Stratification - 10/08/18 1504      Activity Barriers & Cardiac Risk Stratification   Activity Barriers  Shortness of Breath    Cardiac Risk Stratification  High       6 Minute Walk: 6 Minute Walk    Row Name 10/08/18 1503         6 Minute Walk   Phase  Initial     Distance  1400 feet     Walk Time  6 minutes     # of Rest Breaks  0     MPH  2.65     METS  3.03     RPE  11     Perceived Dyspnea   9     VO2 Peak  9.04     Symptoms  No     Resting HR  74 bpm     Resting BP  160/60     Resting Oxygen Saturation   96 %     Exercise Oxygen Saturation  during 6 min walk  91 %     Max Ex. HR  102 bpm     Max Ex. BP  148/70     2 Minute Post BP  124/66        Oxygen Initial Assessment: Oxygen Initial Assessment - 10/08/18 1520      Home Oxygen   Sleep Oxygen Prescription  None    Home Exercise Oxygen Prescription  --   Oxygen is prescribed for as needed at 2 L/M. Patient says she is not using at this time.    Home at Rest Exercise Oxygen Prescription  --   Oxygen is prescribed for as needed at 2 L/M. Patient says she is not  using at this time.    Compliance with Home Oxygen Use  Yes      Initial 6 min Walk   Oxygen Used  None      Program Oxygen Prescription   Program Oxygen Prescription  --   As needed at 2 L/M.     Intervention   Short Term Goals  To learn and understand importance of monitoring SPO2 with  pulse oximeter and demonstrate accurate use of the pulse oximeter.;To learn and understand importance of maintaining oxygen saturations>88%;To learn and demonstrate proper pursed lip breathing techniques or other breathing techniques.;To learn and demonstrate proper use of respiratory medications;To learn and exhibit compliance with exercise, home and travel O2 prescription    Long  Term Goals  Exhibits compliance with exercise, home and travel O2 prescription;Verbalizes importance of monitoring SPO2 with pulse oximeter and return demonstration;Maintenance of O2 saturations>88%;Exhibits proper breathing techniques, such as pursed lip breathing or other method taught during program session       Oxygen Re-Evaluation:   Oxygen Discharge (Final Oxygen Re-Evaluation):   Initial Exercise Prescription: Initial Exercise Prescription - 10/08/18 1500      Date of Initial Exercise RX and Referring Provider   Date  10/08/18    Referring Provider  Icard    Expected Discharge Date  01/08/19      Treadmill   MPH  1.2    Grade  0    Minutes  17    METs  1.91      NuStep   Level  1    SPM  70    Minutes  17    METs  2      Prescription Details   Frequency (times per week)  2    Duration  Progress to 30 minutes of continuous aerobic without signs/symptoms of physical distress      Intensity   THRR 40-80% of Max Heartrate  [phone]    Ratings of Perceived Exertion  11-13    Perceived Dyspnea  0-4      Progression   Progression  Continue to progress workloads to maintain intensity without signs/symptoms of physical distress.      Resistance Training   Training Prescription  Yes    Weight  1    Reps  10-15       Perform Capillary Blood Glucose checks as needed.  Exercise Prescription Changes:  Exercise Prescription Changes    Row Name 10/08/18 1500             Home Exercise Plan   Plans to continue exercise at  Home (comment)       Frequency  Add 3 additional days to  program exercise sessions.       Initial Home Exercises Provided  10/08/18          Exercise Comments:   Exercise Goals and Review:  Exercise Goals    Row Name 10/08/18 1506             Exercise Goals   Increase Physical Activity  Yes       Intervention  Provide advice, education, support and counseling about physical activity/exercise needs.;Develop an individualized exercise prescription for aerobic and resistive training based on initial evaluation findings, risk stratification, comorbidities and participant's personal goals.       Expected Outcomes  Short Term: Attend rehab on a regular basis to increase amount of physical activity.       Increase Strength and Stamina  Yes  Intervention  Provide advice, education, support and counseling about physical activity/exercise needs.;Develop an individualized exercise prescription for aerobic and resistive training based on initial evaluation findings, risk stratification, comorbidities and participant's personal goals.       Expected Outcomes  Short Term: Increase workloads from initial exercise prescription for resistance, speed, and METs.       Able to understand and use rate of perceived exertion (RPE) scale  Yes       Intervention  Provide education and explanation on how to use RPE scale       Expected Outcomes  Short Term: Able to use RPE daily in rehab to express subjective intensity level;Long Term:  Able to use RPE to guide intensity level when exercising independently       Able to understand and use Dyspnea scale  Yes       Intervention  Provide education and explanation on how to use Dyspnea scale       Expected Outcomes  Long Term: Able to use Dyspnea scale to guide intensity level when exercising independently;Short Term: Able to use Dyspnea scale daily in rehab to express subjective sense of shortness of breath during exertion       Knowledge and understanding of Target Heart Rate Range (THRR)  Yes       Intervention   Provide education and explanation of THRR including how the numbers were predicted and where they are located for reference       Expected Outcomes  Short Term: Able to state/look up THRR;Long Term: Able to use THRR to govern intensity when exercising independently;Short Term: Able to use daily as guideline for intensity in rehab       Able to check pulse independently  Yes       Intervention  Provide education and demonstration on how to check pulse in carotid and radial arteries.;Review the importance of being able to check your own pulse for safety during independent exercise       Expected Outcomes  Short Term: Able to explain why pulse checking is important during independent exercise;Long Term: Able to check pulse independently and accurately       Understanding of Exercise Prescription  Yes       Intervention  Provide education, explanation, and written materials on patient's individual exercise prescription       Expected Outcomes  Short Term: Able to explain program exercise prescription;Long Term: Able to explain home exercise prescription to exercise independently          Exercise Goals Re-Evaluation :   Discharge Exercise Prescription (Final Exercise Prescription Changes): Exercise Prescription Changes - 10/08/18 1500      Home Exercise Plan   Plans to continue exercise at  Home (comment)    Frequency  Add 3 additional days to program exercise sessions.    Initial Home Exercises Provided  10/08/18       Nutrition:  Target Goals: Understanding of nutrition guidelines, daily intake of sodium 1500mg , cholesterol 200mg , calories 30% from fat and 7% or less from saturated fats, daily to have 5 or more servings of fruits and vegetables.  Biometrics: Pre Biometrics - 10/08/18 1506      Pre Biometrics   Height  5\' 1"  (1.549 m)    Weight  53.8 kg    Waist Circumference  28 inches    Hip Circumference  34.5 inches    Waist to Hip Ratio  0.81 %    BMI (Calculated)  22.42  Triceps Skinfold  14 mm    % Body Fat  31.7 %    Grip Strength  17.03 kg    Flexibility  0 in    Single Leg Stand  1.5 seconds        Nutrition Therapy Plan and Nutrition Goals:   Nutrition Assessments: Nutrition Assessments - 10/08/18 1537      MEDFICTS Scores   Pre Score  32       Nutrition Goals Re-Evaluation:   Nutrition Goals Discharge (Final Nutrition Goals Re-Evaluation):   Psychosocial: Target Goals: Acknowledge presence or absence of significant depression and/or stress, maximize coping skills, provide positive support system. Participant is able to verbalize types and ability to use techniques and skills needed for reducing stress and depression.  Initial Review & Psychosocial Screening: Initial Psych Review & Screening - 10/08/18 [place]?  Yes      Barriers   Psychosocial barriers to participate in program  There are no identifiable barriers or psychosocial needs.      Screening Interventions   Interventions  Encouraged to exercise    Expected Outcomes  Long Term goal: The participant improves quality of Life and PHQ9 Scores as seen by post scores and/or verbalization of changes       Quality of Life Scores: Quality of Life - 10/08/18 1507      Quality of Life   Select  Quality of Life      Quality of Life Scores   Health/Function Pre  20.07 %    Socioeconomic Pre  28.75 %    Psych/Spiritual Pre  24 %    Family Pre  27 %    GLOBAL Pre  23.17 %      Scores of 19 and below usually indicate a poorer quality of life in these areas.  A difference of  2-3 points is a clinically meaningful difference.  A difference of 2-3 points in the total score of the Quality of Life Index has been associated with significant improvement in overall quality of life, self-image, physical symptoms, and general health in studies assessing change in quality of life.   PHQ-9: Recent Review Flowsheet Data    Depression screen [facility]  2/9 10/08/2018 09/03/2018 08/25/2018 08/17/2018 08/14/2018   Decreased Interest 0 0 0 0 0   Down, Depressed, Hopeless 0 0 0 0 0   PHQ - 2 Score 0 0 0 0 0   Altered sleeping 2 - - - -   Tired, decreased energy 1 - - - -   Change in appetite 1 - - - -   Feeling bad or failure about yourself  0 - - - -   Trouble concentrating 0 - - - -   Moving slowly or fidgety/restless 0 - - - -   Suicidal thoughts 0 - - - -   PHQ-9 Score 4 - - - -   Difficult doing work/chores Somewhat difficult - - - -     Interpretation of Total Score  Total Score Depression Severity:  1-4 = Minimal depression, 5-9 = Mild depression, 10-14 = Moderate depression, 15-19 = Moderately severe depression, 20-27 = Severe depression   Psychosocial Evaluation and Intervention: Psychosocial Evaluation - 10/08/18 1536      Psychosocial Evaluation & Interventions   Interventions  Encouraged to exercise with the program and follow exercise prescription;Relaxation education;Stress management education    Comments  Patient has no psychosical barriers identified  at orientation. Her initial QOL score was 23.17 and her PHQ-9 score was 4.    Expected Outcomes  Patient will have no psychosocial barriers identified at [place].     Continue Psychosocial Services   No Follow up required       Psychosocial Re-Evaluation:   Psychosocial Discharge (Final Psychosocial Re-Evaluation):    Education: Education Goals: Education classes will be provided on a weekly basis, covering required topics. Participant will state understanding/return demonstration of topics presented.  Learning Barriers/Preferences: Learning Barriers/Preferences - 10/08/18 1537      Learning Barriers/Preferences   Learning Barriers  None    Learning Preferences  Written Material       Education Topics: How Lungs Work and Diseases: - Discuss the anatomy of the lungs and diseases that can affect the lungs, such as COPD.   Exercise: -Discuss the  importance of exercise, FITT principles of exercise, normal and abnormal responses to exercise, and how to exercise safely.   Environmental Irritants: -Discuss types of environmental irritants and how to limit exposure to environmental irritants.   Meds/Inhalers and oxygen: - Discuss respiratory medications, definition of an inhaler and oxygen, and the proper way to use an inhaler and oxygen.   Energy Saving Techniques: - Discuss methods to conserve energy and decrease shortness of breath when performing activities of daily living.    Bronchial Hygiene / Breathing Techniques: - Discuss breathing mechanics, pursed-lip breathing technique,  proper posture, effective ways to clear airways, and other functional breathing techniques   Cleaning Equipment: - Provides group verbal and written instruction about the health risks of elevated stress, cause of high stress, and healthy ways to reduce stress.   Nutrition I: Fats: - Discuss the types of cholesterol, what cholesterol does to the body, and how cholesterol levels can be controlled.   Nutrition II: Labels: -Discuss the different components of food labels and how to read food labels.   Respiratory Infections: - Discuss the signs and symptoms of respiratory infections, ways to prevent respiratory infections, and the importance of seeking medical treatment when having a respiratory infection.   Stress I: Signs and Symptoms: - Discuss the causes of stress, how stress may lead to anxiety and depression, and ways to limit stress.   Stress II: Relaxation: -Discuss relaxation techniques to limit stress.   Oxygen for Home/Travel: - Discuss how to prepare for travel when on oxygen and proper ways to transport and store oxygen to ensure safety.   Knowledge Questionnaire Score: Knowledge Questionnaire Score - 10/08/18 1538      Knowledge Questionnaire Score   Pre Score  14/18       Core Components/Risk Factors/Patient Goals at  Admission: Personal Goals and Risk Factors at Admission - 10/08/18 1538      Core Components/Risk Factors/Patient Goals on Admission    Weight Management  Weight Maintenance    Improve shortness of breath with ADL's  Yes    Intervention  Provide education, individualized exercise plan and daily activity instruction to help decrease symptoms of SOB with activities of daily living.    Expected Outcomes  Short Term: Improve cardiorespiratory fitness to achieve a reduction of symptoms when performing ADLs;Long Term: Be able to perform more ADLs without symptoms or delay the onset of symptoms    Hypertension  Yes    Intervention  Provide education on lifestyle modifcations including regular physical activity/exercise, weight management, moderate sodium restriction and increased consumption of fresh fruit, vegetables, and low fat dairy, alcohol moderation, and smoking cessation.;Monitor  prescription use compliance.    Expected Outcomes  Short Term: Continued assessment and intervention until BP is < 140/58mm HG in hypertensive participants. < 130/42mm HG in hypertensive participants with diabetes, heart failure or chronic kidney disease.;Long Term: Maintenance of blood pressure at goal levels.    Personal Goal Other  Yes    Personal Goal  Breathe better. Has less SOB with activities. Gain strength and stamina. Get back to playing golf.     Intervention  Patient will attend PR 2 days/week and supplement with exercise 3 days/week.     Expected Outcomes  Patient will complete the program and meet her personal goals.        Core Components/Risk Factors/Patient Goals Review:    Core Components/Risk Factors/Patient Goals at Discharge (Final Review):    ITP Comments: ITP Comments    Row Name 10/19/18 1450           ITP Comments  Patient new to program. She has completed 2 sessions. Will continue to monitor for progress.           Comments: ITP REVIEW Patient new to program. She has completed 2  sessions. Will continue to monitor for progress.

## 2018-10-19 NOTE — Telephone Encounter (Signed)
Pt returned call. Informed her of the recs per Dr. Valeta Harms. Pt states she was told by her Rheumatologist about the methotrexate. Pt states she was also taken off of Leflunamide because of hair loss. Pt also states she had a recent visit with her Rheumatologist and they are faxing over records of the recent visit.   Will forward to Dr. Valeta Harms as Juluis [place].

## 2018-10-20 ENCOUNTER — Encounter (HOSPITAL_COMMUNITY)
Admission: RE | Admit: 2018-10-20 | Discharge: 2018-10-20 | Disposition: A | Discharge status: Home / Self Care | Payer: Medicare Other | Source: Ambulatory Visit | Attending: Pulmonary Disease | Admitting: Pulmonary Disease

## 2018-10-20 DIAGNOSIS — J849 Interstitial pulmonary disease, unspecified: Secondary | ICD-10-CM

## 2018-10-20 NOTE — Progress Notes (Signed)
Daily Session Note  Patient Details  Name: Susan Fitzgerald MRN: [id number] Date of Birth: 02-04-1932 Referring Provider:     PULMONARY REHAB OTHER RESP ORIENTATION from 10/08/2018 in [place]  Referring Provider  Icard      Encounter Date: 10/20/2018  Check In: Session Check In - 10/20/18 1045      Check-In   Supervising physician immediately available to respond to emergencies  See telemetry face sheet for immediately available MD    Location  [facility]    Staff Present  Russella Dar, MS, EP, [facility], Exercise Physiologist;Eliazar Olivar Zachery Conch, Exercise Physiologist    Medication changes reported      No    Fall or balance concerns reported     No    Warm-up and Cool-down  Performed as group-led instruction    Resistance Training Performed  Yes    VAD Patient?  No    PAD/SET Patient?  No      Pain Assessment   Currently in Pain?  No/denies    Pain Score  0-No pain    Multiple Pain Sites  No       Capillary Blood Glucose: No results found for this or any previous visit (from the past 24 hour(s)).    Social History   Tobacco Use  Smoking Status Never Smoker  Smokeless Tobacco Never Used    Goals Met:  Proper associated with RPD/PD & O2 Sat Using PLB without cueing & demonstrates good technique Exercise tolerated well No report of cardiac concerns or symptoms Strength training completed today  Goals Unmet:  Not Applicable  Comments: Pt able to follow exercise prescription today without complaint.  Will continue to monitor for progression. Check out 11:45.   Dr. Sinda Du is Medical Director for [facility] Pulmonary Re[facility].

## 2018-10-22 ENCOUNTER — Encounter (HOSPITAL_COMMUNITY)
Admission: RE | Admit: 2018-10-22 | Discharge: 2018-10-22 | Disposition: A | Discharge status: Home / Self Care | Payer: Medicare Other | Source: Ambulatory Visit | Attending: Pulmonary Disease | Admitting: Pulmonary Disease

## 2018-10-22 DIAGNOSIS — J849 Interstitial pulmonary disease, unspecified: Secondary | ICD-10-CM

## 2018-10-22 NOTE — Progress Notes (Signed)
Daily Session Note  Patient Details  Name: Susan Fitzgerald MRN: [id number] Date of Birth: [birth year] Referring Provider:     PULMONARY REHAB OTHER RESP ORIENTATION from 10/08/2018 in [place]  Referring Provider  Icard      Encounter Date: 10/22/2018  Check In: Session Check In - 10/22/18 1045      Check-In   Supervising physician immediately available to respond to emergencies  See telemetry face sheet for immediately available MD    Location  [facility]    Staff Present  Benay Pike, Exercise Physiologist;Teodor Prater Wynetta Emery, RN, BSN    Medication changes reported      No    Fall or balance concerns reported     No    Warm-up and Cool-down  Performed as group-led instruction    Resistance Training Performed  Yes    VAD Patient?  No    PAD/SET Patient?  No      Pain Assessment   Currently in Pain?  No/denies    Pain Score  0-No pain    Multiple Pain Sites  No       Capillary Blood Glucose: No results found for this or any previous visit (from the past 24 hour(s)).    Social History   Tobacco Use  Smoking Status Never Smoker  Smokeless Tobacco Never Used    Goals Met:  Proper associated with RPD/PD & O2 Sat Independence with exercise equipment Improved SOB with ADL's Using PLB without cueing & demonstrates good technique Exercise tolerated well No report of cardiac concerns or symptoms Strength training completed today  Goals Unmet:  Not Applicable  Comments: Pt able to follow exercise prescription today without complaint.  Will continue to monitor for progression. Check out 1145.   Dr. Sinda Du is Medical Director for [facility] Pulmonary Rehab.

## 2018-10-23 DIAGNOSIS — J849 Interstitial pulmonary disease, unspecified: Secondary | ICD-10-CM | POA: Diagnosis not present

## 2018-10-27 ENCOUNTER — Encounter (HOSPITAL_COMMUNITY)
Admission: RE | Admit: 2018-10-27 | Discharge: 2018-10-27 | Disposition: A | Discharge status: Home / Self Care | Payer: Medicare Other | Source: Ambulatory Visit | Attending: Pulmonary Disease | Admitting: Pulmonary Disease

## 2018-10-27 DIAGNOSIS — J849 Interstitial pulmonary disease, unspecified: Secondary | ICD-10-CM

## 2018-10-27 NOTE — Progress Notes (Signed)
Daily Session Note  Patient Details  Name: Susan Fitzgerald MRN: [id number] Date of Birth: 1931/12/25 Referring Provider:     PULMONARY REHAB OTHER RESP ORIENTATION from 10/08/2018 in [place]  Referring Provider  Icard      Encounter Date: 10/27/2018  Check In: Session Check In - 10/27/18 1045      Check-In   Supervising physician immediately available to respond to emergencies  See telemetry face sheet for immediately available MD    Location  [facility]    Staff Present  Benay Pike, Exercise Physiologist;Diane Coad, MS, EP, [facility], Exercise Physiologist    Medication changes reported      Yes    Comments  Pt. began taking lisinopril and methatrexate.     Fall or balance concerns reported     No    Warm-up and Cool-down  Performed as group-led instruction    Resistance Training Performed  Yes    VAD Patient?  No    PAD/SET Patient?  No      Pain Assessment   Currently in Pain?  No/denies    Pain Score  0-No pain    Multiple Pain Sites  No       Capillary Blood Glucose: No results found for this or any previous visit (from the past 24 hour(s)).    Social History   Tobacco Use  Smoking Status Never Smoker  Smokeless Tobacco Never Used    Goals Met:  Proper associated with RPD/PD & O2 Sat Independence with exercise equipment Using PLB without cueing & demonstrates good technique Exercise tolerated well No report of cardiac concerns or symptoms Strength training completed today  Goals Unmet:  Not Applicable  Comments: Pt able to follow exercise prescription today without complaint.  Will continue to monitor for progression. Check out 1145.   Dr. Sinda Du is Medical Director for [facility] Pulmonary Rehab.

## 2018-10-28 ENCOUNTER — Other Ambulatory Visit: Payer: Self-pay | Admitting: *Deleted

## 2018-10-28 ENCOUNTER — Ambulatory Visit (INDEPENDENT_AMBULATORY_CARE_PROVIDER_SITE_OTHER): Payer: Medicare Other | Admitting: *Deleted

## 2018-10-28 ENCOUNTER — Other Ambulatory Visit: Payer: Self-pay | Admitting: Pharmacist Clinician (PhC)/ Clinical Pharmacy Specialist

## 2018-10-28 ENCOUNTER — Encounter: Payer: Self-pay | Admitting: *Deleted

## 2018-10-28 VITALS — BP 131/67 | HR 68 | Ht 61.0 in | Wt 115.8 lb

## 2018-10-28 DIAGNOSIS — Z23 Encounter for immunization: Secondary | ICD-10-CM | POA: Diagnosis not present

## 2018-10-28 DIAGNOSIS — Z0000 Encounter for general adult medical examination without abnormal findings: Secondary | ICD-10-CM | POA: Diagnosis not present

## 2018-10-28 DIAGNOSIS — R531 Weakness: Secondary | ICD-10-CM

## 2018-10-28 DIAGNOSIS — L659 Nonscarring hair loss, unspecified: Secondary | ICD-10-CM

## 2018-10-28 DIAGNOSIS — R6889 Other general symptoms and signs: Secondary | ICD-10-CM | POA: Diagnosis not present

## 2018-10-28 NOTE — Patient Instructions (Addendum)
Please work on your goal of having 3 balanced meals per day, a lean protein source, 2 sources of carbohydrates (whole grains or fruits are good options), and a non-starchy vegetable.  Ensure or Boost are good for supplements as needed.  [facility]- [phone] [place] [address] is a good option for a dermatology office.  [facility]- Dr. Elvera Lennox is who Dr. Laurance Flatten prefers - [phone]. [place] [address].    Try Hair, Skin, and Nails vitamin for reducing hair loss- with biotin and collagen.    At your convenience, please bring a copy of your Living Will and [place] to our office to be filed in your medical record.   Consider getting the Shingrix (Shingles vaccine) at your next visit.    Please follow up with Dr. Laurance Flatten as scheduled.  Thank you for coming in for your Annual Wellness Visit today!   Preventive Care 82 Years and Older, Female Preventive care refers to lifestyle choices and visits with your health care provider that can promote health and wellness. What does preventive care include?  A yearly physical exam. This is also called an annual well check.  Dental exams once or twice a year.  Routine eye exams. Ask your health care provider how often you should have your eyes checked.  Personal lifestyle choices, including: ? Daily care of your teeth and gums. ? Regular physical activity. ? Eating a healthy diet. ? Avoiding tobacco and drug use. ? Limiting alcohol use. ? Practicing safe sex. ? Taking low-dose aspirin every day. ? Taking vitamin and mineral supplements as recommended by your health care provider. What happens during an annual well check? The services and screenings done by your health care provider during your annual well check will depend on your age, overall health, lifestyle risk factors, and family history of disease. Counseling Your health care provider may ask you  questions about your:  Alcohol use.  Tobacco use.  Drug use.  Emotional well-being.  Home and relationship well-being.  Sexual activity.  Eating habits.  History of falls.  Memory and ability to understand (cognition).  Work and work Statistician.  Reproductive health.  Screening You may have the following tests or measurements:  Height, weight, and BMI.  Blood pressure.  Lipid and cholesterol levels. These may be checked every 5 years, or more frequently if you are over 78 years old.  Skin check.  Lung cancer screening. You may have this screening every year starting at age 67 if you have a 30-pack-year history of smoking and currently smoke or have quit within the past 15 years.  Fecal occult blood test (FOBT) of the stool. You may have this test every year starting at age 76.  Flexible sigmoidoscopy or colonoscopy. You may have a sigmoidoscopy every 5 years or a colonoscopy every 10 years starting at age 89.  Hepatitis C blood test.  Hepatitis B blood test.  Sexually transmitted disease (STD) testing.  Diabetes screening. This is done by checking your blood sugar (glucose) after you have not eaten for a while (fasting). You may have this done every 1-3 years.  Bone density scan. This is done to screen for osteoporosis. You may have this done starting at age 110.  Mammogram. This may be done every 1-2 years. Talk to your health care provider about how often you should have regular mammograms.  Talk with your health care provider about your test results, treatment options, and if necessary,  the need for more tests. Vaccines Your health care provider may recommend certain vaccines, such as:  Influenza vaccine. This is recommended every year.  Tetanus, diphtheria, and acellular pertussis (Tdap, Td) vaccine. You may need a Td booster every 10 years.  Varicella vaccine. You may need this if you have not been vaccinated.  Zoster vaccine. You may need this  after age 3.  Measles, mumps, and rubella (MMR) vaccine. You may need at least one dose of MMR if you were born in 1957 or later. You may also need a second dose.  Pneumococcal 13-valent conjugate (PCV13) vaccine. One dose is recommended after age 41.  Pneumococcal polysaccharide (PPSV23) vaccine. One dose is recommended after age 45.  Meningococcal vaccine. You may need this if you have certain conditions.  Hepatitis A vaccine. You may need this if you have certain conditions or if you travel or work in places where you may be exposed to hepatitis A.  Hepatitis B vaccine. You may need this if you have certain conditions or if you travel or work in places where you may be exposed to hepatitis B.  Haemophilus influenzae type b (Hib) vaccine. You may need this if you have certain conditions.  Talk to your health care provider about which screenings and vaccines you need and how often you need them. This information is not intended to replace advice given to you by your health care provider. Make sure you discuss any questions you have with your health care provider. Document Released: 12/01/2015 Document Revised: 07/24/2016 Document Reviewed: 09/05/2015 Elsevier Interactive Patient Education  2018 [place] in the Home Falls can cause injuries. They can happen to people of all ages. There are many things you can do to make your home safe and to help prevent falls. What can I do on the outside of my home?  Regularly fix the edges of walkways and driveways and fix any cracks.  Remove anything that might make you trip as you walk through a door, such as a raised step or threshold.  Trim any bushes or trees on the path to your home.  Use bright outdoor lighting.  Clear any walking paths of anything that might make someone trip, such as rocks or tools.  Regularly check to see if handrails are loose or broken. Make sure that both sides of any steps have  handrails.  Any raised decks and porches should have guardrails on the edges.  Have any leaves, snow, or ice cleared regularly.  Use sand or salt on walking paths during winter.  Clean up any spills in your garage right away. This includes oil or grease spills. What can I do in the bathroom?  Use night lights.  Install grab bars by the toilet and in the tub and shower. Do not use towel bars as grab bars.  Use non-skid mats or decals in the tub or shower.  If you need to sit down in the shower, use a plastic, non-slip stool.  Keep the floor dry. Clean up any water that spills on the floor as soon as it happens.  Remove soap buildup in the tub or shower regularly.  Attach bath mats securely with double-sided non-slip rug tape.  Do not have throw rugs and other things on the floor that can make you trip. What can I do in the bedroom?  Use night lights.  Make sure that you have a light by your bed that is easy to reach.  Do  not use any sheets or blankets that are too big for your bed. They should not hang down onto the floor.  Have a firm chair that has side arms. You can use this for support while you get dressed.  Do not have throw rugs and other things on the floor that can make you trip. What can I do in the kitchen?  Clean up any spills right away.  Avoid walking on wet floors.  Keep items that you use a lot in easy-to-reach places.  If you need to reach something above you, use a strong step stool that has a grab bar.  Keep electrical cords out of the way.  Do not use floor polish or wax that makes floors slippery. If you must use wax, use non-skid floor wax.  Do not have throw rugs and other things on the floor that can make you trip. What can I do with my stairs?  Do not leave any items on the stairs.  Make sure that there are handrails on both sides of the stairs and use them. Fix handrails that are broken or loose. Make sure that handrails are as long as  the stairways.  Check any carpeting to make sure that it is firmly attached to the stairs. Fix any carpet that is loose or worn.  Avoid having throw rugs at the top or bottom of the stairs. If you do have throw rugs, attach them to the floor with carpet tape.  Make sure that you have a light switch at the top of the stairs and the bottom of the stairs. If you do not have them, ask someone to add them for you. What else can I do to help prevent falls?  Wear shoes that: ? Do not have high heels. ? Have rubber bottoms. ? Are comfortable and fit you well. ? Are closed at the toe. Do not wear sandals.  If you use a stepladder: ? Make sure that it is fully opened. Do not climb a closed stepladder. ? Make sure that both sides of the stepladder are locked into place. ? Ask someone to hold it for you, if possible.  Clearly mark and make sure that you can see: ? Any grab bars or handrails. ? First and last steps. ? Where the edge of each step is.  Use tools that help you move around (mobility aids) if they are needed. These include: ? Canes. ? Walkers. ? Scooters. ? Crutches.  Turn on the lights when you go into a dark area. Replace any light bulbs as soon as they burn out.  Set up your furniture so you have a clear path. Avoid moving your furniture around.  If any of your floors are uneven, fix them.  If there are any pets around you, be aware of where they are.  Review your medicines with your doctor. Some medicines can make you feel dizzy. This can increase your chance of falling. Ask your doctor what other things that you can do to help prevent falls. This information is not intended to replace advice given to you by your health care provider. Make sure you discuss any questions you have with your health care provider. Document Released: 08/31/2009 Document Revised: 04/11/2016 Document Reviewed: 12/09/2014 Elsevier Interactive Patient Education  [employer].

## 2018-10-28 NOTE — Progress Notes (Signed)
Patient has been feeling more fatigued and noticed thinning of her hair that is substantial.  Will check a B12 level today.  Several of her medications can cause thinning hair.

## 2018-10-28 NOTE — Progress Notes (Signed)
Subjective:   Susan Fitzgerald is a 84 y.o. female who presents for Medicare Annual (Subsequent) preventive examination.  Susan Fitzgerald being involved with her church and playing golf when her health permits.  She currently is enrolled in Pulmonary rehab at [facility] for interstitial lung disease, and attend twice per week for 1 hour each session.  She lives with her husband Susan Fitzgerald of 43 years.  She has 2 children, 1 daughter is deceased.  She has 4 grandchildren and 2 great grandchildren.  She feels her health has improved this year as she has recovered from the total hip replacement she had in 2018, and her other chronic conditions seem to be stable at the present time.  She reports one ER visit in the past year for evaluation after a fall resulting in a significant skin tear.  She reports no hospitalizations in the past year.   Review of Systems:   All systems negative today  Cardiac Risk Factors include: advanced age (>76men, >54 women);hypertension     Objective:     Vitals: BP 131/67   Pulse 68   Ht 5\' 1"  (1.549 m)   Wt 115 lb 12.8 oz (52.5 kg)   BMI 21.88 kg/m   Body mass index is 21.88 kg/m.  Advanced Directives 10/28/2018 10/08/2018 08/20/2017 04/09/2017 02/10/2017 02/09/2017 01/20/2017  Does Patient Have a Medical Advance Directive? Yes Yes No No Yes Yes Yes  Type of Paramedic of [place];Living will [place] will Living will -  Does patient want to make changes to medical advance directive? No - Patient declined No - Patient declined - - No - Patient declined - -  Copy of [place] in Chart? No - copy requested No - copy requested - - - - -  Would patient like information on creating a medical advance directive? - - No - Patient declined No - Patient declined - - -  Pre-existing out of facility DNR order (yellow form or pink MOST form) - - - - - - -    Tobacco Social History   Tobacco Use    Smoking Status Never Smoker  Smokeless Tobacco Never Used     Counseling given: No   Clinical Intake:     Pain Score: 0-No pain                 Past Medical History:  Diagnosis Date  . Arthritis   . Bradycardia 2014  . Calcification of bronchial airway 06/23/2018   01/2017 - See on CT imaging of the chest   . Cataract   . Cerebral vascular disease 06/19/2018   06/19/2018 - MRI with chronic microvascular ischemic change  . Diverticulosis   . Dyspnea    periodically  . Dysrhythmia    a - fib  . Heart murmur    ??? bruit  . History of stress test    a. 04/09/13-nuclear stress-no ischemia low risk  . Hypertension   . Menopause   . Osteoporosis   . PAF (paroxysmal atrial fibrillation) ([place])    a. s/p prior DCCV; b. On flecainide & coumadin (CHA2DS2VASc = 4);  c. 03/2016 Echo: EF 55-60%, mod LVH, mod AI, mild to mod TR, PASP 14mmHg.  Marland Kitchen Pelvic fracture [facility])    Past Surgical History:  Procedure Laterality Date  . BIOPSY BREAST    . BIOPSY BREAST     right & benign  . BREAST SURGERY    .  CARDIOVERSION N/A 10/30/2016   Procedure: CARDIOVERSION;  Surgeon: Larey Dresser, MD;  Location: [place];  Service: Cardiovascular;  Laterality: N/A;  . CARDIOVERSION N/A 08/20/2017   Procedure: CARDIOVERSION;  Surgeon: Jerline Pain, MD;  Location: [facility] ENDOSCOPY;  Service: Cardiovascular;  Laterality: N/A;  . CATARACT EXTRACTION    . EYE SURGERY     cataracts  . TEE WITHOUT CARDIOVERSION N/A 10/30/2016   Procedure: TRANSESOPHAGEAL ECHOCARDIOGRAM (TEE);  Surgeon: Larey Dresser, MD;  Location: [place];  Service: Cardiovascular;  Laterality: N/A;  . TOTAL HIP ARTHROPLASTY Right 01/27/2017   Procedure: TOTAL HIP ARTHROPLASTY ANTERIOR APPROACH;  Surgeon: Rod Can, MD;  Location: [place];  Service: Orthopedics;  Laterality: Right;   Family History  Problem Relation Age of Onset  . Stroke Mother        cerebral hemorrhage  . Heart disease Father        MI  . Heart  attack Father   . Vision loss Father   . Heart disease Brother   . Heart attack Brother   . Cancer Maternal Aunt        breast  . Cancer Brother   . Heart disease Brother   . Cancer Daughter        breast   Social History   Socioeconomic History  . Marital status: Married    Spouse name: Susan Fitzgerald   . Number of children: 2  . Years of education: Not on file  . Highest education level: Not on file  Occupational History  . Not on file  Social Needs  . Financial resource strain: Not hard at all  . Food insecurity:    Worry: Never true    Inability: Never true  . Transportation needs:    Medical: No    Non-medical: No  Tobacco Use  . Smoking status: Never Smoker  . Smokeless tobacco: Never Used  Substance and Sexual Activity  . Alcohol use: No  . Drug use: No  . Sexual activity: Yes  Lifestyle  . Physical activity:    Days per week: 2 days    Minutes per session: 60 min  . Stress: Only a little  Relationships  . Social connections:    Talks on phone: More than three times a week    Gets together: More than three times a week    Attends religious service: More than 4 times per year    Active member of club or organization: Yes    Attends meetings of clubs or organizations: More than 4 times per year    Relationship status: Married  Other Topics Concern  . Not on file  Social History Narrative  . Not on file    Outpatient Encounter Medications as of 10/28/2018  Medication Sig  . apixaban (ELIQUIS) 2.5 MG TABS tablet Take 2.5 mg by mouth 2 (two) times daily.   . budesonide-formoterol (SYMBICORT) 80-4.5 MCG/ACT inhaler Inhale 2 puffs into the lungs 2 (two) times daily.  . calcium-vitamin D (OSCAL WITH D) 250-125 MG-UNIT per tablet Take 1 tablet by mouth 2 (two) times daily.  Marland Kitchen diltiazem (DILACOR XR) 180 MG 24 hr capsule Take 180 mg by mouth daily.  . folic acid (FOLVITE) 1 MG tablet Take 2 mg by mouth daily.   . furosemide (LASIX) 20 MG tablet Take 1 tablet (20 mg  total) by mouth daily.  Marland Kitchen gabapentin (NEURONTIN) 100 MG capsule Take 100 mg by mouth at bedtime.   Marland Kitchen levothyroxine (SYNTHROID, LEVOTHROID) 75  MCG tablet TAKE 1 TABLET BY MOUTH  DAILY  . lisinopril-hydrochlorothiazide (PRINZIDE,ZESTORETIC) 20-12.5 MG tablet Take 1 tablet by mouth daily.  . Melatonin 5 MG TABS Take 5 mg by mouth at bedtime.  . methotrexate (RHEUMATREX) 5 MG tablet Take 20 mg by mouth once a week. Caution: Chemotherapy. Protect from light.  . Multiple Vitamin (MULTIVITAMIN) capsule Take 1 capsule by mouth daily.  . potassium chloride (K-DUR) 10 MEQ tablet Take 10 mEq by mouth daily.  Marland Kitchen Spacer/Aero-Holding Chambers (AEROCHAMBER MV) inhaler Use as instructed  . guaiFENesin (MUCINEX) 600 MG 12 hr tablet Take 600 mg by mouth 2 (two) times daily as needed.   . [DISCONTINUED] leflunomide (ARAVA) 10 MG tablet Take 1 tablet by mouth daily.   . [DISCONTINUED] predniSONE (STERAPRED UNI-PAK 21 TAB) 10 MG (21) TBPK tablet Take 10 mg by mouth. Taper dose- 5-6 days left on taper, unsure of exact dose 09/30/18   No facility-administered encounter medications on file as of 10/28/2018.     Activities of Daily Living In your present state of health, do you have any difficulty performing the following activities: 10/28/2018  Hearing? N  Vision? N  Difficulty concentrating or making decisions? Y  Comment Trouble remembering at times   Walking or climbing stairs? N  Dressing or bathing? N  Doing errands, shopping? N  Preparing Food and eating ? N  Using the Toilet? N  In the past six months, have you accidently leaked urine? Y  Comment Urinary urgency  Do you have problems with loss of bowel control? N  Managing your Medications? N  Managing your Finances? N  Housekeeping or managing your Housekeeping? N  Some recent data might be hidden    Patient Care Team: Chipper Herb, MD as PCP - General (Family Medicine) Minus Breeding, MD as PCP - Cardiology (Cardiology) Druscilla Brownie,  MD as Consulting Physician (Dermatology) Hennie Duos, MD as Consulting Physician (Rheumatology) Marlaine Hind, MD as Consulting Physician (Physical Medicine and Rehabilitation)    Assessment:   This is a routine wellness examination for Susan Fitzgerald.  Exercise Activities and Dietary recommendations  Patient states she usually eats 3 meals per day, but her appetite has been decreased recently.  She usually has oatmeal or cereal or eggs for breakfast, grazes on lunch - usually a sandwich and nuts, and soup for supper.  Advised patient to make sure she is getting a lean protein source at each meal - greek yogurt, peanut butter, beans, eggs, [place], chicken, fish, cottage cheese are good options.  Encouraged plenty of fruits and vegetables and whole grains also.  Patient has access to and resources for all the food she needs.   Current Exercise Habits: Structured exercise class, Type of exercise: treadmill(Attend pulmonary rehab twice weekly- uses treadmill and nustep machines), Time (Minutes): 60, Frequency (Times/Week): 2, Weekly Exercise (Minutes/Week): 120, Intensity: Moderate, Exercise limited by: respiratory conditions(s);cardiac condition(s)  Goals    . Have 3 meals a day     Try to have 3 balanced meals per day including a lean protein source, 2 sources of carbohydrates (whole grains or fruits are good options), and a non-starchy vegetable.  Ensure or Boost are good for supplements as needed.        Fall Risk Fall Risk  10/28/2018 10/08/2018 09/03/2018 08/25/2018 08/12/2018  Falls in the past year? 1 1 Yes Yes Yes  Number falls in past yr: 0 0 1 1 1   Injury with Fall? 0 0 Yes Yes -  Comment - - - - -  Risk for fall due to : - History of fall(s) - - -  Risk for fall due to: Comment - - - - -  Follow up - Falls prevention discussed - - -  Fall prevention discussed, education given   Is the patient's home free of loose throw rugs in walkways, pet beds, electrical cords, etc?   yes       Grab bars in the bathroom? no      Handrails on the stairs?   yes      Adequate lighting?   yes   Depression Screen PHQ 2/9 Scores 10/28/2018 10/08/2018 09/03/2018 08/25/2018  PHQ - 2 Score 0 0 0 0  PHQ- 9 Score - 4 - -  Exception Documentation - - Patient refusal -     Cognitive Function MMSE - Mini Mental State Exam 10/28/2018 05/07/2016 04/10/2015  Orientation to time 5 4 5   Orientation to Place 5 5 5   Registration 3 3 3   Attention/ Calculation 5 4 5   Recall 3 3 3   Language- name 2 objects 2 2 2   Language- repeat 1 1 1   Language- follow 3 step command 3 1 3   Language- read & follow direction 1 1 1   Write a sentence 1 1 1   Copy design 1 - 1  Total score 30 - 30        Immunization History  Administered Date(s) Administered  . Influenza, High Dose Seasonal PF 09/20/2016, 09/19/2017, 08/25/2018  . Influenza,inj,Quad PF,6+ Mos 08/23/2013, 09/05/2014, 08/28/2015  . Pneumococcal Conjugate-13 09/05/2014    Qualifies for Shingles Vaccine? Yes, declined today  Screening Tests Health Maintenance  Topic Date Due  . PNA vac Low Risk Adult (2 of 2 - PPSV23) 09/06/2015  . MAMMOGRAM  09/30/2019  . DEXA SCAN  09/29/2020  . TETANUS/TDAP  08/18/2021  . INFLUENZA VACCINE  Completed  Pneumovax 23 given today  Cancer Screenings: Lung: Low Dose CT Chest recommended if Age 23-80 years, 30 pack-year currently smoking OR have quit w/in 15years. Patient does not qualify. Breast:  Up to date on Mammogram? Yes   Up to date of Bone Density/Dexa? Yes Colorectal:  Not indicated  Additional Screenings:  Hepatitis C Screening:  Not indicated     Plan:     Work on your goal of having 3 balanced meals per day, including a lean protein source, 2 sources of carbohydrates (whole grains or fruits are good options), and a non-starchy vegetable.  Ensure Boost are good for supplements as needed. Try Hair, Skin, and Nails vitamin for reducing hair loss- with biotin and collagen.   Please bring  a copy of your Living Will and Bee to our office to be filed in your medical record.  Consider getting the Shingrix (Shingles vaccine) at your next visit.   Follow up with Dr. Laurance Flatten as scheduled.     I have personally reviewed and noted the following in the patient's chart:   . Medical and social history . Use of alcohol, tobacco or illicit drugs  . Current medications and supplements . Functional ability and status . Nutritional status . Physical activity . Advanced directives . List of other physicians . Hospitalizations, surgeries, and ER visits in previous 12 months . Vitals . Screenings to include cognitive, depression, and falls . Referrals and appointments  In addition, I have reviewed and discussed with patient certain preventive protocols, quality metrics, and best practice recommendations. A written personalized care plan for preventive services as well as general  preventive health recommendations were provided to patient.     Denyce Robert, RN  10/28/2018

## 2018-10-29 ENCOUNTER — Telehealth: Payer: Self-pay | Admitting: Family Medicine

## 2018-10-29 ENCOUNTER — Encounter (HOSPITAL_COMMUNITY)
Admission: RE | Admit: 2018-10-29 | Discharge: 2018-10-29 | Disposition: A | Discharge status: Home / Self Care | Payer: Medicare Other | Source: Ambulatory Visit | Attending: Pulmonary Disease | Admitting: Pulmonary Disease

## 2018-10-29 DIAGNOSIS — J849 Interstitial pulmonary disease, unspecified: Secondary | ICD-10-CM

## 2018-10-29 LAB — VITAMIN B12: Vitamin B-12: 535 pg/mL (ref 232–1245)

## 2018-10-29 NOTE — Progress Notes (Signed)
Daily Session Note  Patient Details  Name: Susan Fitzgerald MRN: [id number] Date of Birth: 08/02/1932 Referring Provider:     PULMONARY REHAB OTHER RESP ORIENTATION from 10/08/2018 in [place]  Referring Provider  Icard      Encounter Date: 10/29/2018  Check In: Session Check In - 10/29/18 1045      Check-In   Supervising physician immediately available to respond to emergencies  See telemetry face sheet for immediately available MD    Location  [facility]    Staff Present  Benay Pike, Exercise Physiologist;Diane Coad, MS, EP, CHC, Exercise Physiologist;Sheritha Louis Wynetta Emery, RN, BSN    Medication changes reported      No    Fall or balance concerns reported     No    Warm-up and Cool-down  Performed as group-led instruction    Resistance Training Performed  Yes    VAD Patient?  No    PAD/SET Patient?  No      Pain Assessment   Currently in Pain?  No/denies    Pain Score  0-No pain    Multiple Pain Sites  No       Capillary Blood Glucose: Results for orders placed or performed in visit on 10/28/18 (from the past 24 hour(s))  Vitamin B12     Status: None   Collection Time: 10/28/18  4:54 PM  Result Value Ref Range   Vitamin B-12 535 232 - 1,245 pg/mL   Narrative   Performed at:  [address] [place], [place]  [id number] Lab Director: Rush Farmer MD, Phone:  [phone]      Social History   Tobacco Use  Smoking Status Never Smoker  Smokeless Tobacco Never Used    Goals Met:  Proper associated with RPD/PD & O2 Sat Independence with exercise equipment Improved SOB with ADL's Using PLB without cueing & demonstrates good technique Exercise tolerated well No report of cardiac concerns or symptoms Strength training completed today  Goals Unmet:  Not Applicable  Comments: Pt able to follow exercise prescription today without complaint.  Will continue to monitor for progression. Check out  1145.   Dr. Sinda Du is Medical Director for [facility] Pulmonary Rehab.

## 2018-10-30 ENCOUNTER — Encounter: Payer: Medicare Other | Admitting: *Deleted

## 2018-10-30 NOTE — Telephone Encounter (Signed)
Pt aware of results 

## 2018-11-03 ENCOUNTER — Encounter (HOSPITAL_COMMUNITY)
Admission: RE | Admit: 2018-11-03 | Discharge: 2018-11-03 | Disposition: A | Discharge status: Home / Self Care | Payer: Medicare Other | Source: Ambulatory Visit | Attending: Pulmonary Disease | Admitting: Pulmonary Disease

## 2018-11-03 DIAGNOSIS — J849 Interstitial pulmonary disease, unspecified: Secondary | ICD-10-CM

## 2018-11-03 NOTE — Progress Notes (Signed)
Daily Session Note  Patient Details  Name: Susan Fitzgerald MRN: [id number] Date of Birth: 1932/05/27 Referring Provider:     PULMONARY REHAB OTHER RESP ORIENTATION from 10/08/2018 in [place]  Referring Provider  Icard      Encounter Date: 11/03/2018  Check In: Session Check In - 11/03/18 1108      Check-In   Supervising physician immediately available to respond to emergencies  See telemetry face sheet for immediately available MD    Location  [facility]    Staff Present  Benay Pike, Exercise Physiologist;Diane Coad, MS, EP, [facility], Exercise Physiologist    Medication changes reported      No    Fall or balance concerns reported     No    Warm-up and Cool-down  Performed as group-led instruction    Resistance Training Performed  Yes    VAD Patient?  No    PAD/SET Patient?  No      Pain Assessment   Currently in Pain?  No/denies    Pain Score  0-No pain    Multiple Pain Sites  No       Capillary Blood Glucose: No results found for this or any previous visit (from the past 24 hour(s)).    Social History   Tobacco Use  Smoking Status Never Smoker  Smokeless Tobacco Never Used    Goals Met:  Proper associated with RPD/PD & O2 Sat Independence with exercise equipment Using PLB without cueing & demonstrates good technique Exercise tolerated well No report of cardiac concerns or symptoms Strength training completed today  Goals Unmet:  Not Applicable  Comments: Pt able to follow exercise prescription today without complaint.  Will continue to monitor for progression. Check out 1145.   Dr. Kate Sable is Medical Director for [facility] Cardiac and Pulmonary Rehab.

## 2018-11-05 ENCOUNTER — Encounter (HOSPITAL_COMMUNITY)
Admission: RE | Admit: 2018-11-05 | Discharge: 2018-11-05 | Disposition: A | Discharge status: Home / Self Care | Payer: Medicare Other | Source: Ambulatory Visit | Attending: Pulmonary Disease | Admitting: Pulmonary Disease

## 2018-11-05 DIAGNOSIS — J849 Interstitial pulmonary disease, unspecified: Secondary | ICD-10-CM

## 2018-11-05 NOTE — Progress Notes (Addendum)
Daily Session Note  Patient Details  Name: Susan Fitzgerald MRN: [id number] Date of Birth: Sep 11, 1932 Referring Provider:     PULMONARY REHAB OTHER RESP ORIENTATION from 10/08/2018 in [place]  Referring Provider  Icard      Encounter Date: 11/05/2018  Check In: Session Check In - 11/05/18 1045      Check-In   Supervising physician immediately available to respond to emergencies  See telemetry face sheet for immediately available MD    Location  [facility]    Staff Present  Benay Pike, Exercise Physiologist;Diane Coad, MS, EP, CH[facility], Exercise Physiologist;Jazzmyne Rasnick Wynetta Emery, RN, BSN    Medication changes reported      No    Fall or balance concerns reported     No    Warm-up and Cool-down  Performed as group-led instruction    Resistance Training Performed  Yes    VAD Patient?  No    PAD/SET Patient?  No      Pain Assessment   Currently in Pain?  No/denies    Pain Score  0-No pain    Multiple Pain Sites  No       Capillary Blood Glucose: No results found for this or any previous visit (from the past 24 hour(s)).    Social History   Tobacco Use  Smoking Status Never Smoker  Smokeless Tobacco Never Used    Goals Met:  Proper associated with RPD/PD & O2 Sat Independence with exercise equipment Improved SOB with ADL's Using PLB without cueing & demonstrates good technique Exercise tolerated well No report of cardiac concerns or symptoms Strength training completed today  Goals Unmet:  Not Applicable  Comments: Pt able to follow exercise prescription today without complaint.  Will continue to monitor for progression. Check out 1145.  Dr. Sinda Du is the director for Pulmonary Rehab and [facility].

## 2018-11-06 ENCOUNTER — Other Ambulatory Visit (INDEPENDENT_AMBULATORY_CARE_PROVIDER_SITE_OTHER): Payer: Medicare Other

## 2018-11-06 DIAGNOSIS — I482 Chronic atrial fibrillation, unspecified: Secondary | ICD-10-CM

## 2018-11-06 DIAGNOSIS — L659 Nonscarring hair loss, unspecified: Secondary | ICD-10-CM

## 2018-11-06 LAB — BASIC METABOLIC PANEL
BUN/Creatinine Ratio: 26 (ref 12–28)
BUN: 32 mg/dL — ABNORMAL HIGH (ref 8–27)
CO2: 27 mmol/L (ref 20–29)
Calcium: 9 mg/dL (ref 8.7–10.3)
Chloride: 100 mmol/L (ref 96–106)
Creatinine, Ser: 1.21 mg/dL — ABNORMAL HIGH (ref 0.57–1.00)
GFR calc Af Amer: 47 mL/min/{1.73_m2} — ABNORMAL LOW (ref >59)
GFR calc non Af Amer: 41 mL/min/{1.73_m2} — ABNORMAL LOW (ref >59)
Glucose: 81 mg/dL (ref 65–99)
Potassium: 3.8 mmol/L (ref 3.5–5.2)
SODIUM: 142 mmol/L (ref 134–144)

## 2018-11-06 LAB — SPECIMEN STATUS REPORT

## 2018-11-06 MED ORDER — CYANOCOBALAMIN 1000 MCG/ML IJ SOLN
1000.0000 ug | INTRAMUSCULAR | Status: AC
  Administered 2018-11-06 – 2019-02-09 (×4): 1000 ug via INTRAMUSCULAR

## 2018-11-06 NOTE — Progress Notes (Signed)
Pt given Cyanocobalamin inj Tolerated well 

## 2018-11-06 NOTE — Progress Notes (Signed)
Pulmonary Individual Treatment Plan  Patient Details  Name: Susan Fitzgerald MRN: [id number] Date of Birth: 08/14/1932 Referring Provider:     PULMONARY REHAB OTHER RESP ORIENTATION from 10/08/2018 in [facility]  Referring Provider  Icard      Initial Encounter Date:    PULMONARY REHAB OTHER RESP ORIENTATION from 10/08/2018 in [facility]  Date  10/08/18      Visit Diagnosis: ILD (interstitial lung disease) ([place])  Patient's Home Medications on Admission:   Current Outpatient Medications:  .  apixaban (ELIQUIS) 2.5 MG TABS tablet, Take 2.5 mg by mouth 2 (two) times daily. , Disp: , Rfl:  .  budesonide-formoterol (SYMBICORT) 80-4.5 MCG/ACT inhaler, Inhale 2 puffs into the lungs 2 (two) times daily., Disp: 1 Inhaler, Rfl: 0 .  calcium-vitamin D (OSCAL WITH D) 250-125 MG-UNIT per tablet, Take 1 tablet by mouth 2 (two) times daily., Disp: , Rfl:  .  diltiazem (DILACOR XR) 180 MG 24 hr capsule, Take 180 mg by mouth daily., Disp: , Rfl:  .  folic acid (FOLVITE) 1 MG tablet, Take 2 mg by mouth daily. , Disp: , Rfl:  .  furosemide (LASIX) 20 MG tablet, Take 1 tablet (20 mg total) by mouth daily., Disp: 180 tablet, Rfl: 1 .  gabapentin (NEURONTIN) 100 MG capsule, Take 100 mg by mouth at bedtime. , Disp: , Rfl:  .  guaiFENesin (MUCINEX) 600 MG 12 hr tablet, Take 600 mg by mouth 2 (two) times daily as needed. , Disp: , Rfl:  .  levothyroxine (SYNTHROID, LEVOTHROID) 75 MCG tablet, TAKE 1 TABLET BY MOUTH  DAILY, Disp: 90 tablet, Rfl: 3 .  lisinopril-hydrochlorothiazide (PRINZIDE,ZESTORETIC) 20-12.5 MG tablet, Take 1 tablet by mouth daily., Disp: 90 tablet, Rfl: 3 .  Melatonin 5 MG TABS, Take 5 mg by mouth at bedtime., Disp: , Rfl:  .  methotrexate (RHEUMATREX) 5 MG tablet, Take 20 mg by mouth once a week. Caution: Chemotherapy. Protect from light., Disp: , Rfl:  .  Multiple Vitamin (MULTIVITAMIN) capsule, Take 1 capsule by mouth daily., Disp: , Rfl:  .   potassium chloride (K-DUR) 10 MEQ tablet, Take 10 mEq by mouth daily., Disp: , Rfl:  .  Spacer/Aero-Holding Chambers (AEROCHAMBER MV) inhaler, Use as instructed, Disp: 1 each, Rfl: 0  Current Facility-Administered Medications:  .  cyanocobalamin ((VITAMIN B-12)) injection 1,000 mcg, 1,000 mcg, Intramuscular, Q30 days, Chipper Herb, MD, 1,000 mcg at 11/06/18 1219  Past Medical History: Past Medical History:  Diagnosis Date  . Arthritis   . Bradycardia 2014  . Calcification of bronchial airway 06/23/2018   01/2017 - See on CT imaging of the chest   . Cataract   . Cerebral vascular disease 06/19/2018   06/19/2018 - MRI with chronic microvascular ischemic change  . Diverticulosis   . Dyspnea    periodically  . Dysrhythmia    a - fib  . Heart murmur    ??? bruit  . History of stress test    a. 04/09/13-nuclear stress-no ischemia low risk  . Hypertension   . Menopause   . Osteoporosis   . PAF (paroxysmal atrial fibrillation) ([place])    a. s/p prior DCCV; b. On flecainide & coumadin (CHA2DS2VASc = 4);  c. 03/2016 Echo: EF 55-60%, mod LVH, mod AI, mild to mod TR, PASP 46mmHg.  Marland Kitchen Pelvic fracture ([facility])     Tobacco Use: Social History   Tobacco Use  Smoking Status Never Smoker  Smokeless Tobacco Never Used  Labs: Recent Review Flowsheet Data    Labs for ITP Cardiac and Pulmonary Rehab Latest Ref Rng & Units 08/28/2015 12/27/2015 09/16/2016 03/24/2017 09/15/2017   Cholestrol 100 - 199 mg/dL 168 181 149 169 166   LDLCALC 0 - 99 mg/dL 90 - - - -   LDLDIRECT 0 - 99 mg/dL - - - - -   HDL >39 mg/dL 66 66 65 77 71   Trlycerides 0 - 149 mg/dL 60 82 73 55 67      Capillary Blood Glucose: No results found for: GLUCAP   Pulmonary Assessment Scores: Pulmonary Assessment Scores    Row Name 10/08/18 1532         ADL UCSD   ADL Phase  Entry     SOB Score total  28     Rest  0     Walk  5     Stairs  3     Bath  2     Dress  2     Shop  2       CAT Score   CAT Score  10        mMRC Score   mMRC Score  2        Pulmonary Function Assessment:   Exercise Target Goals: Exercise Program Goal: Individual exercise prescription set using results from initial 6 min walk test and THRR while considering  patient's activity barriers and safety.   Exercise Prescription Goal: Initial exercise prescription builds to 30-45 minutes a day of aerobic activity, 2-3 days per week.  Home exercise guidelines will be given to patient during program as part of exercise prescription that the participant will acknowledge.  Activity Barriers & Risk Stratification: Activity Barriers & Cardiac Risk Stratification - 10/08/18 1504      Activity Barriers & Cardiac Risk Stratification   Activity Barriers  Shortness of Breath    Cardiac Risk Stratification  High       6 Minute Walk: 6 Minute Walk    Row Name 10/08/18 1503         6 Minute Walk   Phase  Initial     Distance  1400 feet     Walk Time  6 minutes     # of Rest Breaks  0     MPH  2.65     METS  3.03     RPE  11     Perceived Dyspnea   9     VO2 Peak  9.04     Symptoms  No     Resting HR  74 bpm     Resting BP  160/60     Resting Oxygen Saturation   96 %     Exercise Oxygen Saturation  during 6 min walk  91 %     Max Ex. HR  102 bpm     Max Ex. BP  148/70     2 Minute Post BP  124/66        Oxygen Initial Assessment: Oxygen Initial Assessment - 10/08/18 1520      Home Oxygen   Sleep Oxygen Prescription  None    Home Exercise Oxygen Prescription  --   Oxygen is prescribed for as needed at 2 L/M. Patient says she is not using at this time.    Home at Rest Exercise Oxygen Prescription  --   Oxygen is prescribed for as needed at 2 L/M. Patient says she is not using at this time.  Compliance with Home Oxygen Use  Yes      Initial 6 min Walk   Oxygen Used  None      Program Oxygen Prescription   Program Oxygen Prescription  --   As needed at 2 L/M.     Intervention   Short Term Goals  To learn and  understand importance of monitoring SPO2 with pulse oximeter and demonstrate accurate use of the pulse oximeter.;To learn and understand importance of maintaining oxygen saturations>88%;To learn and demonstrate proper pursed lip breathing techniques or other breathing techniques.;To learn and demonstrate proper use of respiratory medications;To learn and exhibit compliance with exercise, home and travel O2 prescription    Long  Term Goals  Exhibits compliance with exercise, home and travel O2 prescription;Verbalizes importance of monitoring SPO2 with pulse oximeter and return demonstration;Maintenance of O2 saturations>88%;Exhibits proper breathing techniques, such as pursed lip breathing or other method taught during program session       Oxygen Re-Evaluation: Oxygen Re-Evaluation    Row Name 11/06/18 1306             Program Oxygen Prescription   Program Oxygen Prescription  None         Home Oxygen   Home Oxygen Device  None       Sleep Oxygen Prescription  None       Home Exercise Oxygen Prescription  None       Home at Rest Exercise Oxygen Prescription  None         Goals/Expected Outcomes   Short Term Goals  To learn and understand importance of monitoring SPO2 with pulse oximeter and demonstrate accurate use of the pulse oximeter.;To learn and understand importance of maintaining oxygen saturations>88%;To learn and demonstrate proper pursed lip breathing techniques or other breathing techniques.;To learn and demonstrate proper use of respiratory medications;To learn and exhibit compliance with exercise, home and travel O2 prescription       Long  Term Goals  Exhibits compliance with exercise, home and travel O2 prescription;Verbalizes importance of monitoring SPO2 with pulse oximeter and return demonstration;Maintenance of O2 saturations>88%;Exhibits proper breathing techniques, such as pursed lip breathing or other method taught during program session       Comments  Patient  exhibitis proper demonstration of using pulse oximeter and is able to verbalize the importance of maintaining her O2 sat >88%. She is able to demonstrate proper pursed lip breathing.        Goals/Expected Outcomes  Patient will continue to meet her short and long term goals.           Oxygen Discharge (Final Oxygen Re-Evaluation): Oxygen Re-Evaluation - 11/06/18 1306      Program Oxygen Prescription   Program Oxygen Prescription  None      Home Oxygen   Home Oxygen Device  None    Sleep Oxygen Prescription  None    Home Exercise Oxygen Prescription  None    Home at Rest Exercise Oxygen Prescription  None      Goals/Expected Outcomes   Short Term Goals  To learn and understand importance of monitoring SPO2 with pulse oximeter and demonstrate accurate use of the pulse oximeter.;To learn and understand importance of maintaining oxygen saturations>88%;To learn and demonstrate proper pursed lip breathing techniques or other breathing techniques.;To learn and demonstrate proper use of respiratory medications;To learn and exhibit compliance with exercise, home and travel O2 prescription    Long  Term Goals  Exhibits compliance with exercise, home and travel O2 prescription;Verbalizes importance  of monitoring SPO2 with pulse oximeter and return demonstration;Maintenance of O2 saturations>88%;Exhibits proper breathing techniques, such as pursed lip breathing or other method taught during program session    Comments  Patient exhibitis proper demonstration of using pulse oximeter and is able to verbalize the importance of maintaining her O2 sat >88%. She is able to demonstrate proper pursed lip breathing.     Goals/Expected Outcomes  Patient will continue to meet her short and long term goals.        Initial Exercise Prescription: Initial Exercise Prescription - 10/08/18 1500      Date of Initial Exercise RX and Referring Provider   Date  10/08/18    Referring Provider  Icard    Expected  Discharge Date  01/08/19      Treadmill   MPH  1.2    Grade  0    Minutes  17    METs  1.91      NuStep   Level  1    SPM  70    Minutes  17    METs  2      Prescription Details   Frequency (times per week)  2    Duration  Progress to 30 minutes of continuous aerobic without signs/symptoms of physical distress      Intensity   THRR 40-80% of Max Heartrate  [phone]    Ratings of Perceived Exertion  11-13    Perceived Dyspnea  0-4      Progression   Progression  Continue to progress workloads to maintain intensity without signs/symptoms of physical distress.      Resistance Training   Training Prescription  Yes    Weight  1    Reps  10-15       Perform Capillary Blood Glucose checks as needed.  Exercise Prescription Changes:  Exercise Prescription Changes    Row Name 10/08/18 1500 10/22/18 0800 11/05/18 1500         Response to Exercise   Blood Pressure (Admit)  -  144/72  184/70     Blood Pressure (Exercise)  -  164/64  190/70     Blood Pressure (Exit)  -  138/68  150/70     Heart Rate (Admit)  -  72 bpm  71 bpm     Heart Rate (Exercise)  -  80 bpm  95 bpm     Heart Rate (Exit)  -  73 bpm  84 bpm     Oxygen Saturation (Admit)  -  95 %  96 %     Oxygen Saturation (Exercise)  -  94 %  93 %     Oxygen Saturation (Exit)  -  96 %  97 %     Rating of Perceived Exertion (Exercise)  -  11  11     Perceived Dyspnea (Exercise)  -  11  13     Comments  -  First two weeks of exercise sessions   increase in overall workload     Duration  -  Progress to 30 minutes of  aerobic without signs/symptoms of physical distress  Progress to 30 minutes of  aerobic without signs/symptoms of physical distress     Intensity  -  THRR New [phone]  THRR unchanged       Progression   Progression  -  Continue to progress workloads to maintain intensity without signs/symptoms of physical distress.  Continue to progress workloads to maintain intensity without signs/symptoms of physical  distress.     Average METs  -  1.76  2.14       Resistance Training   Training Prescription  -  Yes  Yes     Weight  -  1  2     Reps  -  10-15  10-15       Treadmill   MPH  -  1.2  1.8     Grade  -  0  0     Minutes  -  17  17     METs  -  1.92  2.37       NuStep   Level  -  1  2     SPM  -  70  118     Minutes  -  22  22     METs  -  1.6  1.9       Home Exercise Plan   Plans to continue exercise at  Home (comment)  Home (comment)  Home (comment)     Frequency  Add 3 additional days to program exercise sessions.  Add 3 additional days to program exercise sessions.  Add 3 additional days to program exercise sessions.     Initial Home Exercises Provided  10/08/18  10/08/18  10/08/18        Exercise Comments:  Exercise Comments    Row Name 11/05/18 1522           Exercise Comments  Pt. has done extremely well in the program. She has tolerated exercise with ease and is always eager for progressions and increases. We will continue to monitor her progress and Progress as needed.           Exercise Goals and Review:  Exercise Goals    Row Name 10/08/18 1506             Exercise Goals   Increase Physical Activity  Yes       Intervention  Provide advice, education, support and counseling about physical activity/exercise needs.;Develop an individualized exercise prescription for aerobic and resistive training based on initial evaluation findings, risk stratification, comorbidities and participant's personal goals.       Expected Outcomes  Short Term: Attend rehab on a regular basis to increase amount of physical activity.       Increase Strength and Stamina  Yes       Intervention  Provide advice, education, support and counseling about physical activity/exercise needs.;Develop an individualized exercise prescription for aerobic and resistive training based on initial evaluation findings, risk stratification, comorbidities and participant's personal goals.       Expected  Outcomes  Short Term: Increase workloads from initial exercise prescription for resistance, speed, and METs.       Able to understand and use rate of perceived exertion (RPE) scale  Yes       Intervention  Provide education and explanation on how to use RPE scale       Expected Outcomes  Short Term: Able to use RPE daily in rehab to express subjective intensity level;Long Term:  Able to use RPE to guide intensity level when exercising independently       Able to understand and use Dyspnea scale  Yes       Intervention  Provide education and explanation on how to use Dyspnea scale       Expected Outcomes  Long Term: Able to use Dyspnea scale to guide intensity level when exercising independently;Short Term: Able to  use Dyspnea scale daily in rehab to express subjective sense of shortness of breath during exertion       Knowledge and understanding of Target Heart Rate Range (THRR)  Yes       Intervention  Provide education and explanation of THRR including how the numbers were predicted and where they are located for reference       Expected Outcomes  Short Term: Able to state/look up THRR;Long Term: Able to use THRR to govern intensity when exercising independently;Short Term: Able to use daily as guideline for intensity in rehab       Able to check pulse independently  Yes       Intervention  Provide education and demonstration on how to check pulse in carotid and radial arteries.;Review the importance of being able to check your own pulse for safety during independent exercise       Expected Outcomes  Short Term: Able to explain why pulse checking is important during independent exercise;Long Term: Able to check pulse independently and accurately       Understanding of Exercise Prescription  Yes       Intervention  Provide education, explanation, and written materials on patient's individual exercise prescription       Expected Outcomes  Short Term: Able to explain program exercise prescription;Long  Term: Able to explain home exercise prescription to exercise independently          Exercise Goals Re-Evaluation : Exercise Goals Re-Evaluation    Row Name 11/05/18 1520             Exercise Goal Re-Evaluation   Exercise Goals Review  Increase Physical Activity;Increase Strength and Stamina;Able to understand and use rate of perceived exertion (RPE) scale;Able to understand and use Dyspnea scale;Knowledge and understanding of Target Heart Rate Range (THRR);Able to check pulse independently;Understanding of Exercise Prescription       Comments  Pt. has done well in the program, she has attended 8 sessions so far. She is very eager to come every day and work to increase strength and stamina.        Expected Outcomes  increase strength, imrpove ability to perform ADL's without SOB           Discharge Exercise Prescription (Final Exercise Prescription Changes): Exercise Prescription Changes - 11/05/18 1500      Response to Exercise   Blood Pressure (Admit)  184/70    Blood Pressure (Exercise)  190/70    Blood Pressure (Exit)  150/70    Heart Rate (Admit)  71 bpm    Heart Rate (Exercise)  95 bpm    Heart Rate (Exit)  84 bpm    Oxygen Saturation (Admit)  96 %    Oxygen Saturation (Exercise)  93 %    Oxygen Saturation (Exit)  97 %    Rating of Perceived Exertion (Exercise)  11    Perceived Dyspnea (Exercise)  13    Comments  increase in overall workload    Duration  Progress to 30 minutes of  aerobic without signs/symptoms of physical distress    Intensity  THRR unchanged      Progression   Progression  Continue to progress workloads to maintain intensity without signs/symptoms of physical distress.    Average METs  2.14      Resistance Training   Training Prescription  Yes    Weight  2    Reps  10-15      Treadmill   MPH  1.8  Grade  0    Minutes  17    METs  2.37      NuStep   Level  2    SPM  118    Minutes  22    METs  1.9      Home Exercise Plan   Plans to  continue exercise at  Home (comment)    Frequency  Add 3 additional days to program exercise sessions.    Initial Home Exercises Provided  10/08/18       Nutrition:  Target Goals: Understanding of nutrition guidelines, daily intake of sodium 1500mg , cholesterol 200mg , calories 30% from fat and 7% or less from saturated fats, daily to have 5 or more servings of fruits and vegetables.  Biometrics: Pre Biometrics - 10/08/18 1506      Pre Biometrics   Height  5\' 1"  (1.549 m)    Weight  53.8 kg    Waist Circumference  28 inches    Hip Circumference  34.5 inches    Waist to Hip Ratio  0.81 %    BMI (Calculated)  22.42    Triceps Skinfold  14 mm    % Body Fat  31.7 %    Grip Strength  17.03 kg    Flexibility  0 in    Single Leg Stand  1.5 seconds        Nutrition Therapy Plan and Nutrition Goals: Nutrition Therapy & Goals - 11/06/18 1308      Nutrition Therapy   RD appointment deferred  Yes      Personal Nutrition Goals   Comments  Encouraged patient to meet with  RD in January. Will continue to monitor.       Intervention Plan   Intervention  Nutrition handout(s) given to patient.       Nutrition Assessments: Nutrition Assessments - 10/08/18 1537      MEDFICTS Scores   Pre Score  32       Nutrition Goals Re-Evaluation:   Nutrition Goals Discharge (Final Nutrition Goals Re-Evaluation):   Psychosocial: Target Goals: Acknowledge presence or absence of significant depression and/or stress, maximize coping skills, provide positive support system. Participant is able to verbalize types and ability to use techniques and skills needed for reducing stress and depression.  Initial Review & Psychosocial Screening: Initial Psych Review & Screening - 10/08/18 [place]?  Yes      Barriers   Psychosocial barriers to participate in program  There are no identifiable barriers or psychosocial needs.      Screening Interventions    Interventions  Encouraged to exercise    Expected Outcomes  Long Term goal: The participant improves quality of Life and PHQ9 Scores as seen by post scores and/or verbalization of changes       Quality of Life Scores: Quality of Life - 10/08/18 1507      Quality of Life   Select  Quality of Life      Quality of Life Scores   Health/Function Pre  20.07 %    Socioeconomic Pre  28.75 %    Psych/Spiritual Pre  24 %    Family Pre  27 %    GLOBAL Pre  23.17 %      Scores of 19 and below usually indicate a poorer quality of life in these areas.  A difference of  2-3 points is a clinically meaningful difference.  A difference of 2-3  points in the total score of the Quality of Life Index has been associated with significant improvement in overall quality of life, self-image, physical symptoms, and general health in studies assessing change in quality of life.   PHQ-9: Recent Review Flowsheet Data    Depression screen [facility] 2/9 10/28/2018 10/08/2018 09/03/2018 08/25/2018 08/17/2018   Decreased Interest 0 0 0 0 0   Down, Depressed, Hopeless 0 0 0 0 0   PHQ - 2 Score 0 0 0 0 0   Altered sleeping - 2 - - -   Tired, decreased energy - 1 - - -   Change in appetite - 1 - - -   Feeling bad or failure about yourself  - 0 - - -   Trouble concentrating - 0 - - -   Moving slowly or fidgety/restless - 0 - - -   Suicidal thoughts - 0 - - -   PHQ-9 Score - 4 - - -   Difficult doing work/chores - Somewhat difficult - - -     Interpretation of Total Score  Total Score Depression Severity:  1-4 = Minimal depression, 5-9 = Mild depression, 10-14 = Moderate depression, 15-19 = Moderately severe depression, 20-27 = Severe depression   Psychosocial Evaluation and Intervention: Psychosocial Evaluation - 10/08/18 1536      Psychosocial Evaluation & Interventions   Interventions  Encouraged to exercise with the program and follow exercise prescription;Relaxation education;Stress management education     Comments  Patient has no psychosical barriers identified at orientation. Her initial QOL score was 23.17 and her PHQ-9 score was 4.    Expected Outcomes  Patient will have no psychosocial barriers identified at [place].     Continue Psychosocial Services   No Follow up required       Psychosocial Re-Evaluation: Psychosocial Re-Evaluation    [place] Name 11/06/18 1311             Psychosocial Re-Evaluation   Current issues with  None Identified       Comments  Patient's initial QOL score was 23.17 and  her PHQ-9 score was 4 with no psychosocial issues identified.        Expected Outcomes  Patient will have no psychosocial issues identified at discharge.        Interventions  Relaxation education;Stress management education;Encouraged to attend Pulmonary Rehabilitation for the exercise       Continue Psychosocial Services   No Follow up required          Psychosocial Discharge (Final Psychosocial Re-Evaluation): Psychosocial Re-Evaluation - 11/06/18 1311      Psychosocial Re-Evaluation   Current issues with  None Identified    Comments  Patient's initial QOL score was 23.17 and  her PHQ-9 score was 4 with no psychosocial issues identified.     Expected Outcomes  Patient will have no psychosocial issues identified at discharge.     Interventions  Relaxation education;Stress management education;Encouraged to attend Pulmonary Rehabilitation for the exercise    Continue Psychosocial Services   No Follow up required        Education: Education Goals: Education classes will be provided on a weekly basis, covering required topics. Participant will state understanding/return demonstration of topics presented.  Learning Barriers/Preferences: Learning Barriers/Preferences - 10/08/18 1537      Learning Barriers/Preferences   Learning Barriers  None    Learning Preferences  Written Material       Education Topics: How Lungs Work and Diseases: - Discuss the anatomy  of the lungs and  diseases that can affect the lungs, such as COPD.   Exercise: -Discuss the importance of exercise, FITT principles of exercise, normal and abnormal responses to exercise, and how to exercise safely.   Environmental Irritants: -Discuss types of environmental irritants and how to limit exposure to environmental irritants.   Meds/Inhalers and oxygen: - Discuss respiratory medications, definition of an inhaler and oxygen, and the proper way to use an inhaler and oxygen.   Energy Saving Techniques: - Discuss methods to conserve energy and decrease shortness of breath when performing activities of daily living.    Bronchial Hygiene / Breathing Techniques: - Discuss breathing mechanics, pursed-lip breathing technique,  proper posture, effective ways to clear airways, and other functional breathing techniques   PULMONARY REHAB OTHER RESPIRATORY from 11/05/2018 in [place]  Date  10/22/18  Educator  Wynetta Emery  Instruction Review Code  2- Demonstrated Understanding      Cleaning Equipment: - Provides group verbal and written instruction about the health risks of elevated stress, cause of high stress, and healthy ways to reduce stress.   PULMONARY REHAB OTHER RESPIRATORY from 11/05/2018 in [place]  Date  10/29/18  Educator  Wynetta Emery  Instruction Review Code  2- Demonstrated Understanding      Nutrition I: Fats: - Discuss the types of cholesterol, what cholesterol does to the body, and how cholesterol levels can be controlled.   PULMONARY REHAB OTHER RESPIRATORY from 11/05/2018 in [place]  Date  11/05/18  Educator  Wynetta Emery  Instruction Review Code  2- Demonstrated Understanding      Nutrition II: Labels: -Discuss the different components of food labels and how to read food labels.   Respiratory Infections: - Discuss the signs and symptoms of respiratory infections, ways to prevent respiratory infections,  and the importance of seeking medical treatment when having a respiratory infection.   Stress I: Signs and Symptoms: - Discuss the causes of stress, how stress may lead to anxiety and depression, and ways to limit stress.   Stress II: Relaxation: -Discuss relaxation techniques to limit stress.   Oxygen for Home/Travel: - Discuss how to prepare for travel when on oxygen and proper ways to transport and store oxygen to ensure safety.   Knowledge Questionnaire Score: Knowledge Questionnaire Score - 10/08/18 1538      Knowledge Questionnaire Score   Pre Score  14/18       Core Components/Risk Factors/Patient Goals at Admission: Personal Goals and Risk Factors at Admission - 10/08/18 1538      Core Components/Risk Factors/Patient Goals on Admission    Weight Management  Weight Maintenance    Improve shortness of breath with ADL's  Yes    Intervention  Provide education, individualized exercise plan and daily activity instruction to help decrease symptoms of SOB with activities of daily living.    Expected Outcomes  Short Term: Improve cardiorespiratory fitness to achieve a reduction of symptoms when performing ADLs;Long Term: Be able to perform more ADLs without symptoms or delay the onset of symptoms    Hypertension  Yes    Intervention  Provide education on lifestyle modifcations including regular physical activity/exercise, weight management, moderate sodium restriction and increased consumption of fresh fruit, vegetables, and low fat dairy, alcohol moderation, and smoking cessation.;Monitor prescription use compliance.    Expected Outcomes  Short Term: Continued assessment and intervention until BP is < 140/17mm HG in hypertensive participants. < 130/47mm HG in hypertensive participants with diabetes, heart  failure or chronic kidney disease.;Long Term: Maintenance of blood pressure at goal levels.    Personal Goal Other  Yes    Personal Goal  Breathe better. Has less SOB with  activities. Gain strength and stamina. Get back to playing golf.     Intervention  Patient will attend PR 2 days/week and supplement with exercise 3 days/week.     Expected Outcomes  Patient will complete the program and meet her personal goals.        Core Components/Risk Factors/Patient Goals Review:  Goals and Risk Factor Review    Row Name 11/06/18 1309             Core Components/Risk Factors/Patient Goals Review   Personal Goals Review  Weight Management/Obesity;Hypertension;Improve shortness of breath with ADL's Breathe better; gain strength; do activities w/o SOB; golf again.        Review  Patient has completed 8 sessions losing 1.5 lbs since her initial visit. She is doing well in the program with progression. She says she does feel stronger and has more energy. She feels like she will be ready to golf in the Spring if she continues the program. Her breathing has improved. Will continue to monitor for progress.        Expected Outcomes  Patient will continue to attend sessions and complete the program meeting her personal goals.           Core Components/Risk Factors/Patient Goals at Discharge (Final Review):  Goals and Risk Factor Review - 11/06/18 1309      Core Components/Risk Factors/Patient Goals Review   Personal Goals Review  Weight Management/Obesity;Hypertension;Improve shortness of breath with ADL's   Breathe better; gain strength; do activities w/o SOB; golf again.    Review  Patient has completed 8 sessions losing 1.5 lbs since her initial visit. She is doing well in the program with progression. She says she does feel stronger and has more energy. She feels like she will be ready to golf in the Spring if she continues the program. Her breathing has improved. Will continue to monitor for progress.     Expected Outcomes  Patient will continue to attend sessions and complete the program meeting her personal goals.        ITP Comments: ITP Comments    Row Name  10/19/18 1450           ITP Comments  Patient new to program. She has completed 2 sessions. Will continue to monitor for progress.           Comments: ITP REVIEW Pt is making expected progress toward pulmonary rehab goals after completing 8 sessions. Recommend continued exercise, life style modification, education, and utilization of breathing techniques to increase stamina and strength and decrease shortness of breath with exertion.

## 2018-11-07 LAB — CBC WITH DIFFERENTIAL/PLATELET
Basophils Absolute: 0.1 10*3/uL (ref 0.0–0.2)
Basos: 1 %
EOS (ABSOLUTE): 0.1 10*3/uL (ref 0.0–0.4)
Eos: 2 %
Hematocrit: 34.8 % (ref 34.0–46.6)
Hemoglobin: 11.4 g/dL (ref 11.1–15.9)
Immature Grans (Abs): 0 10*3/uL (ref 0.0–0.1)
Immature Granulocytes: 0 %
LYMPHS ABS: 1.3 10*3/uL (ref 0.7–3.1)
Lymphs: 25 %
MCH: 31 pg (ref 26.6–33.0)
MCHC: 32.8 g/dL (ref 31.5–35.7)
MCV: 95 fL (ref 79–97)
Monocytes Absolute: 0.7 10*3/uL (ref 0.1–0.9)
Monocytes: 13 %
Neutrophils Absolute: 3.1 10*3/uL (ref 1.4–7.0)
Neutrophils: 59 %
Platelets: 177 10*3/uL (ref 150–450)
RBC: 3.68 x10E6/uL — ABNORMAL LOW (ref 3.77–5.28)
RDW: 13 % (ref 12.3–15.4)
WBC: 5.2 10*3/uL (ref 3.4–10.8)

## 2018-11-07 LAB — SPECIMEN STATUS REPORT

## 2018-11-09 ENCOUNTER — Telehealth: Payer: Self-pay | Admitting: *Deleted

## 2018-11-09 NOTE — Telephone Encounter (Signed)
.  The patient has been notified of the result and verbalized understanding.  All questions (if any) were answered. Raiford Simmonds, RN 11/09/2018 2:49 PM    PATIENT WANTED DR HOCHREIN TO BE AWARE BLOOD PRESSURE HAS BEEN FLUCTUATING-WHILE AT PULMONARY REHAB- 210 TO 180 SBP. PER PATIENT , REHAB WILL BE SENDING PRINT OUT OF BLOOD PRESSURE  PATIENT STATES THESE LABS VALUE WERE 2 WEEKS AFTER THE START OF THE NEW INCREASE DOSE LISINOPRIL/HCTZ  20/12.5 MG PER DR HOCHREIN'S ORDER.  PATENT AWARE WILL ROUTE TO DR Percival Spanish

## 2018-11-09 NOTE — Telephone Encounter (Signed)
-----   Message from Minus Breeding, MD sent at 11/08/2018  9:38 AM EST ----- Labs OK.  Call Ms. Napierkowski with the results and send results to Chipper Herb, MD

## 2018-11-10 ENCOUNTER — Encounter (HOSPITAL_COMMUNITY): Payer: Medicare Other

## 2018-11-12 ENCOUNTER — Encounter (HOSPITAL_COMMUNITY): Payer: Medicare Other

## 2018-11-13 ENCOUNTER — Ambulatory Visit (INDEPENDENT_AMBULATORY_CARE_PROVIDER_SITE_OTHER): Payer: Medicare Other | Admitting: *Deleted

## 2018-11-13 DIAGNOSIS — Z23 Encounter for immunization: Secondary | ICD-10-CM

## 2018-11-13 DIAGNOSIS — E039 Hypothyroidism, unspecified: Secondary | ICD-10-CM | POA: Diagnosis not present

## 2018-11-13 NOTE — Progress Notes (Signed)
Shingrix given and patient tolerated well.  

## 2018-11-13 NOTE — Telephone Encounter (Signed)
Please call the patient and ask her to take her BP at home twice daily for two weeks.  She can drop the results off in [place].   I will look at them and might make changes to her meds based on this and readings from rehab.  I have not seen these readings yet.

## 2018-11-14 LAB — THYROID PANEL WITH TSH
Free Thyroxine Index: 4 (ref 1.2–4.9)
T3 UPTAKE RATIO: 32 % (ref 24–39)
T4, Total: 12.6 ug/dL — ABNORMAL HIGH (ref 4.5–12.0)
TSH: 0.746 u[IU]/mL (ref 0.450–4.500)

## 2018-11-16 NOTE — Telephone Encounter (Signed)
Call and spoke with pt to keep a blood pressure diary for the next 2 week and drop it off at his [place] office, pt voice understanding and thanks for the call back.

## 2018-11-17 ENCOUNTER — Encounter (HOSPITAL_COMMUNITY)
Admission: RE | Admit: 2018-11-17 | Discharge: 2018-11-17 | Disposition: A | Discharge status: Home / Self Care | Payer: Medicare Other | Source: Ambulatory Visit | Attending: Pulmonary Disease | Admitting: Pulmonary Disease

## 2018-11-17 DIAGNOSIS — J849 Interstitial pulmonary disease, unspecified: Secondary | ICD-10-CM

## 2018-11-17 NOTE — Progress Notes (Signed)
Daily Session Note  Patient Details  Name: Susan Fitzgerald MRN: [id number] Date of Birth: Jun 30, 1932 Referring Provider:     PULMONARY REHAB OTHER RESP ORIENTATION from 10/08/2018 in [place]  Referring Provider  Icard      Encounter Date: 11/17/2018  Check In: Session Check In - 11/17/18 1045      Check-In   Supervising physician immediately available to respond to emergencies  See telemetry face sheet for immediately available MD    Location  [facility]    Staff Present  Benay Pike, Exercise Physiologist;Diane Coad, MS, EP, [facility], Exercise Physiologist    Medication changes reported      No    Fall or balance concerns reported     No    Warm-up and Cool-down  Performed as group-led instruction    Resistance Training Performed  Yes    VAD Patient?  No    PAD/SET Patient?  No      Pain Assessment   Currently in Pain?  No/denies    Pain Score  0-No pain    Multiple Pain Sites  No       Capillary Blood Glucose: No results found for this or any previous visit (from the past 24 hour(s)).    Social History   Tobacco Use  Smoking Status Never Smoker  Smokeless Tobacco Never Used    Goals Met:  Proper associated with RPD/PD & O2 Sat Independence with exercise equipment Using PLB without cueing & demonstrates good technique Exercise tolerated well No report of cardiac concerns or symptoms Strength training completed today  Goals Unmet:  Not Applicable  Comments: Pt able to follow exercise prescription today without complaint.  Will continue to monitor for progression. Check out 1145.   Dr. Kate Sable is Medical Director for [facility] Cardiac and Pulmonary Rehab.

## 2018-11-19 ENCOUNTER — Encounter (HOSPITAL_COMMUNITY)
Admission: RE | Admit: 2018-11-19 | Discharge: 2018-11-19 | Disposition: A | Discharge status: Home / Self Care | Payer: Medicare Other | Source: Ambulatory Visit | Attending: Pulmonary Disease | Admitting: Pulmonary Disease

## 2018-11-19 DIAGNOSIS — J849 Interstitial pulmonary disease, unspecified: Secondary | ICD-10-CM

## 2018-11-19 NOTE — Progress Notes (Addendum)
Daily Session Note  Patient Details  Name: Susan Fitzgerald MRN: [id number] Date of Birth: [birth year] Referring Provider:     PULMONARY REHAB OTHER RESP ORIENTATION from 10/08/2018 in [place]  Referring Provider  Icard      Encounter Date: 11/19/2018  Check In: Session Check In - 11/19/18 1045      Check-In   Supervising physician immediately available to respond to emergencies  See telemetry face sheet for immediately available MD    Location  [facility]    Staff Present  Benay Pike, Exercise Physiologist;Diane Coad, MS, EP, [facility], Exercise Physiologist;Keldon Lassen Wynetta Emery, RN, BSN    Medication changes reported      No    Fall or balance concerns reported     No    Warm-up and Cool-down  Performed as group-led instruction    Resistance Training Performed  Yes    VAD Patient?  No    PAD/SET Patient?  No      Pain Assessment   Currently in Pain?  No/denies    Pain Score  0-No pain    Multiple Pain Sites  No       Capillary Blood Glucose: No results found for this or any previous visit (from the past 24 hour(s)).    Social History   Tobacco Use  Smoking Status Never Smoker  Smokeless Tobacco Never Used    Goals Met:  Proper associated with RPD/PD & O2 Sat Independence with exercise equipment Improved SOB with ADL's Using PLB without cueing & demonstrates good technique Exercise tolerated well No report of cardiac concerns or symptoms Strength training completed today  Goals Unmet:  Not Applicable  Comments: Pt able to follow exercise prescription today without complaint.  Will continue to monitor for progression. Check out 1145.   Dr. Sinda Du is Medical Director for [facility] Pulmonary Rehab.

## 2018-11-23 ENCOUNTER — Telehealth: Payer: Self-pay | Admitting: Family Medicine

## 2018-11-23 MED ORDER — POTASSIUM CHLORIDE CRYS ER 20 MEQ PO TBCR: 20.0000 meq | EXTENDED_RELEASE_TABLET | Freq: Every day | ORAL | 1 refills | Status: DC

## 2018-11-23 MED ORDER — FUROSEMIDE 20 MG PO TABS: 20.0000 mg | ORAL_TABLET | Freq: Every day | ORAL | 1 refills | Status: DC

## 2018-11-23 NOTE — Telephone Encounter (Signed)
Incoming call from pt Pt needs refill on Klor con Last lab K+ was 3.8 Pt is currently on 10 meq but pt requesting 20 meq Pt states tablet is scored so it could be split if needed Please advise

## 2018-11-23 NOTE — Telephone Encounter (Signed)
Pt notified RXs sent into [facility] per pt request Okayed per Dr Laurance Flatten

## 2018-11-23 NOTE — Telephone Encounter (Signed)
Can change to 54meq, but should continue with current dose.

## 2018-11-24 ENCOUNTER — Encounter (HOSPITAL_COMMUNITY): Payer: Medicare Other

## 2018-11-25 ENCOUNTER — Ambulatory Visit: Payer: Medicare Other | Admitting: Pulmonary Disease

## 2018-11-26 ENCOUNTER — Encounter (HOSPITAL_COMMUNITY)
Admission: RE | Admit: 2018-11-26 | Discharge: 2018-11-26 | Disposition: A | Discharge status: Home / Self Care | Payer: Medicare Other | Source: Ambulatory Visit | Attending: Pulmonary Disease | Admitting: Pulmonary Disease

## 2018-11-26 DIAGNOSIS — J849 Interstitial pulmonary disease, unspecified: Secondary | ICD-10-CM | POA: Diagnosis not present

## 2018-11-26 NOTE — Progress Notes (Addendum)
Daily Session Note  Patient Details  Name: Susan Fitzgerald MRN: [id number] Date of Birth: 1931-11-21 Referring Provider:     PULMONARY REHAB OTHER RESP ORIENTATION from 10/08/2018 in [place]  Referring Provider  Icard      Encounter Date: 11/26/2018  Check In: Session Check In - 11/26/18 1045      Check-In   Supervising physician immediately available to respond to emergencies  See telemetry face sheet for immediately available MD    Location  [facility]    Staff Present  Benay Pike, Exercise Physiologist;Diane Coad, MS, EP, [facility], Exercise Physiologist;Suttyn Cryder Wynetta Emery, RN, BSN    Medication changes reported      No    Fall or balance concerns reported     No    Warm-up and Cool-down  Performed as group-led instruction    Resistance Training Performed  Yes    VAD Patient?  No    PAD/SET Patient?  No      Pain Assessment   Currently in Pain?  No/denies    Pain Score  0-No pain    Multiple Pain Sites  No       Capillary Blood Glucose: No results found for this or any previous visit (from the past 24 hour(s)).    Social History   Tobacco Use  Smoking Status Never Smoker  Smokeless Tobacco Never Used    Goals Met:  Proper associated with RPD/PD & O2 Sat Independence with exercise equipment Improved SOB with ADL's Using PLB without cueing & demonstrates good technique Exercise tolerated well No report of cardiac concerns or symptoms Strength training completed today  Goals Unmet:  Not Applicable  Comments: Pt able to follow exercise prescription today without complaint.  Will continue to monitor for progression. Check out 1245.   Dr. Sinda Du is the medical director for [facility] Pulmonary Rehabilitation.

## 2018-11-26 NOTE — Progress Notes (Signed)
Pulmonary Individual Treatment Plan  Patient Details  Name: Susan Fitzgerald MRN: [id number] Date of Birth: August 04, 1932 Referring Provider:     PULMONARY REHAB OTHER RESP ORIENTATION from 10/08/2018 in [place]  Referring Provider  Icard      Initial Encounter Date:    PULMONARY REHAB OTHER RESP ORIENTATION from 10/08/2018 in [place]  Date  10/08/18      Visit Diagnosis: ILD (interstitial lung disease) ([place])  Patient's Home Medications on Admission:   Current Outpatient Medications:  .  apixaban (ELIQUIS) 2.5 MG TABS tablet, Take 2.5 mg by mouth 2 (two) times daily. , Disp: , Rfl:  .  budesonide-formoterol (SYMBICORT) 80-4.5 MCG/ACT inhaler, Inhale 2 puffs into the lungs 2 (two) times daily., Disp: 1 Inhaler, Rfl: 0 .  calcium-vitamin D (OSCAL WITH D) 250-125 MG-UNIT per tablet, Take 1 tablet by mouth 2 (two) times daily., Disp: , Rfl:  .  diltiazem (DILACOR XR) 180 MG 24 hr capsule, Take 180 mg by mouth daily., Disp: , Rfl:  .  folic acid (FOLVITE) 1 MG tablet, Take 2 mg by mouth daily. , Disp: , Rfl:  .  furosemide (LASIX) 20 MG tablet, Take 1 tablet (20 mg total) by mouth daily., Disp: 180 tablet, Rfl: 1 .  gabapentin (NEURONTIN) 100 MG capsule, Take 100 mg by mouth at bedtime. , Disp: , Rfl:  .  guaiFENesin (MUCINEX) 600 MG 12 hr tablet, Take 600 mg by mouth 2 (two) times daily as needed. , Disp: , Rfl:  .  levothyroxine (SYNTHROID, LEVOTHROID) 75 MCG tablet, TAKE 1 TABLET BY MOUTH  DAILY, Disp: 90 tablet, Rfl: 3 .  lisinopril-hydrochlorothiazide (PRINZIDE,ZESTORETIC) 20-12.5 MG tablet, Take 1 tablet by mouth daily., Disp: 90 tablet, Rfl: 3 .  Melatonin 5 MG TABS, Take 5 mg by mouth at bedtime., Disp: , Rfl:  .  methotrexate (RHEUMATREX) 5 MG tablet, Take 20 mg by mouth once a week. Caution: Chemotherapy. Protect from light., Disp: , Rfl:  .  Multiple Vitamin (MULTIVITAMIN) capsule, Take 1 capsule by mouth daily., Disp: , Rfl:  .   potassium chloride (K-DUR) 10 MEQ tablet, Take 10 mEq by mouth daily., Disp: , Rfl:  .  potassium chloride SA (KLOR-CON M20) 20 MEQ tablet, Take 1 tablet (20 mEq total) by mouth daily., Disp: 90 tablet, Rfl: 1 .  Spacer/Aero-Holding Chambers (AEROCHAMBER MV) inhaler, Use as instructed, Disp: 1 each, Rfl: 0  Current Facility-Administered Medications:  .  cyanocobalamin ((VITAMIN B-12)) injection 1,000 mcg, 1,000 mcg, Intramuscular, Q30 days, Chipper Herb, MD, 1,000 mcg at 11/06/18 1219  Past Medical History: Past Medical History:  Diagnosis Date  . Arthritis   . Bradycardia 2014  . Calcification of bronchial airway 06/23/2018   01/2017 - See on CT imaging of the chest   . Cataract   . Cerebral vascular disease 06/19/2018   06/19/2018 - MRI with chronic microvascular ischemic change  . Diverticulosis   . Dyspnea    periodically  . Dysrhythmia    a - fib  . Heart murmur    ??? bruit  . History of stress test    a. 04/09/13-nuclear stress-no ischemia low risk  . Hypertension   . Menopause   . Osteoporosis   . PAF (paroxysmal atrial fibrillation) ([place])    a. s/p prior DCCV; b. On flecainide & coumadin (CHA2DS2VASc = 4);  c. 03/2016 Echo: EF 55-60%, mod LVH, mod AI, mild to mod TR, PASP 5mmHg.  Marland Kitchen Pelvic fracture ([place])  Tobacco Use: Social History   Tobacco Use  Smoking Status Never Smoker  Smokeless Tobacco Never Used    Labs: Recent Review Flowsheet Data    Labs for ITP Cardiac and Pulmonary Rehab Latest Ref Rng & Units 08/28/2015 12/27/2015 09/16/2016 03/24/2017 09/15/2017   Cholestrol 100 - 199 mg/dL 168 181 149 169 166   LDLCALC 0 - 99 mg/dL 90 - - - -   LDLDIRECT 0 - 99 mg/dL - - - - -   HDL >39 mg/dL 66 66 65 77 71   Trlycerides 0 - 149 mg/dL 60 82 73 55 67      Capillary Blood Glucose: No results found for: GLUCAP   Pulmonary Assessment Scores: Pulmonary Assessment Scores    Row Name 10/08/18 1532         ADL UCSD   ADL Phase  Entry     SOB Score total  28      Rest  0     Walk  5     Stairs  3     Bath  2     Dress  2     Shop  2       CAT Score   CAT Score  10       mMRC Score   mMRC Score  2        Pulmonary Function Assessment:   Exercise Target Goals: Exercise Program Goal: Individual exercise prescription set using results from initial 6 min walk test and THRR while considering  patient's activity barriers and safety.   Exercise Prescription Goal: Initial exercise prescription builds to 30-45 minutes a day of aerobic activity, 2-3 days per week.  Home exercise guidelines will be given to patient during program as part of exercise prescription that the participant will acknowledge.  Activity Barriers & Risk Stratification: Activity Barriers & Cardiac Risk Stratification - 10/08/18 1504      Activity Barriers & Cardiac Risk Stratification   Activity Barriers  Shortness of Breath    Cardiac Risk Stratification  High       6 Minute Walk: 6 Minute Walk    Row Name 10/08/18 1503         6 Minute Walk   Phase  Initial     Distance  1400 feet     Walk Time  6 minutes     # of Rest Breaks  0     MPH  2.65     METS  3.03     RPE  11     Perceived Dyspnea   9     VO2 Peak  9.04     Symptoms  No     Resting HR  74 bpm     Resting BP  160/60     Resting Oxygen Saturation   96 %     Exercise Oxygen Saturation  during 6 min walk  91 %     Max Ex. HR  102 bpm     Max Ex. BP  148/70     2 Minute Post BP  124/66        Oxygen Initial Assessment: Oxygen Initial Assessment - 10/08/18 1520      Home Oxygen   Sleep Oxygen Prescription  None    Home Exercise Oxygen Prescription  --   Oxygen is prescribed for as needed at 2 L/M. Patient says she is not using at this time.    Home at Rest Exercise Oxygen Prescription  --  Oxygen is prescribed for as needed at 2 L/M. Patient says she is not using at this time.    Compliance with Home Oxygen Use  Yes      Initial 6 min Walk   Oxygen Used  None      Program Oxygen  Prescription   Program Oxygen Prescription  --   As needed at 2 L/M.     Intervention   Short Term Goals  To learn and understand importance of monitoring SPO2 with pulse oximeter and demonstrate accurate use of the pulse oximeter.;To learn and understand importance of maintaining oxygen saturations>88%;To learn and demonstrate proper pursed lip breathing techniques or other breathing techniques.;To learn and demonstrate proper use of respiratory medications;To learn and exhibit compliance with exercise, home and travel O2 prescription    Long  Term Goals  Exhibits compliance with exercise, home and travel O2 prescription;Verbalizes importance of monitoring SPO2 with pulse oximeter and return demonstration;Maintenance of O2 saturations>88%;Exhibits proper breathing techniques, such as pursed lip breathing or other method taught during program session       Oxygen Re-Evaluation: Oxygen Re-Evaluation    Row Name 11/06/18 1306 11/26/18 1555           Program Oxygen Prescription   Program Oxygen Prescription  None  None        Home Oxygen   Home Oxygen Device  None  None      Sleep Oxygen Prescription  None  None      Home Exercise Oxygen Prescription  None  None      Home at Rest Exercise Oxygen Prescription  None  None        Goals/Expected Outcomes   Short Term Goals  To learn and understand importance of monitoring SPO2 with pulse oximeter and demonstrate accurate use of the pulse oximeter.;To learn and understand importance of maintaining oxygen saturations>88%;To learn and demonstrate proper pursed lip breathing techniques or other breathing techniques.;To learn and demonstrate proper use of respiratory medications;To learn and exhibit compliance with exercise, home and travel O2 prescription  To learn and understand importance of monitoring SPO2 with pulse oximeter and demonstrate accurate use of the pulse oximeter.;To learn and understand importance of maintaining oxygen  saturations>88%;To learn and demonstrate proper pursed lip breathing techniques or other breathing techniques.;To learn and demonstrate proper use of respiratory medications;To learn and exhibit compliance with exercise, home and travel O2 prescription      Long  Term Goals  Exhibits compliance with exercise, home and travel O2 prescription;Verbalizes importance of monitoring SPO2 with pulse oximeter and return demonstration;Maintenance of O2 saturations>88%;Exhibits proper breathing techniques, such as pursed lip breathing or other method taught during program session  Exhibits compliance with exercise, home and travel O2 prescription;Verbalizes importance of monitoring SPO2 with pulse oximeter and return demonstration;Maintenance of O2 saturations>88%;Exhibits proper breathing techniques, such as pursed lip breathing or other method taught during program session      Comments  Patient exhibitis proper demonstration of using pulse oximeter and is able to verbalize the importance of maintaining her O2 sat >88%. She is able to demonstrate proper pursed lip breathing.   Patient exhibitis proper demonstration of using pulse oximeter and is able to verbalize the importance of maintaining her O2 sat >88%. She is able to demonstrate proper pursed lip breathing.       Goals/Expected Outcomes  Patient will continue to meet her short and long term goals.   Patient will continue to meet her short and long term goals.  Oxygen Discharge (Final Oxygen Re-Evaluation): Oxygen Re-Evaluation - 11/26/18 1555      Program Oxygen Prescription   Program Oxygen Prescription  None      Home Oxygen   Home Oxygen Device  None    Sleep Oxygen Prescription  None    Home Exercise Oxygen Prescription  None    Home at Rest Exercise Oxygen Prescription  None      Goals/Expected Outcomes   Short Term Goals  To learn and understand importance of monitoring SPO2 with pulse oximeter and demonstrate accurate use of the  pulse oximeter.;To learn and understand importance of maintaining oxygen saturations>88%;To learn and demonstrate proper pursed lip breathing techniques or other breathing techniques.;To learn and demonstrate proper use of respiratory medications;To learn and exhibit compliance with exercise, home and travel O2 prescription    Long  Term Goals  Exhibits compliance with exercise, home and travel O2 prescription;Verbalizes importance of monitoring SPO2 with pulse oximeter and return demonstration;Maintenance of O2 saturations>88%;Exhibits proper breathing techniques, such as pursed lip breathing or other method taught during program session    Comments  Patient exhibitis proper demonstration of using pulse oximeter and is able to verbalize the importance of maintaining her O2 sat >88%. She is able to demonstrate proper pursed lip breathing.     Goals/Expected Outcomes  Patient will continue to meet her short and long term goals.        Initial Exercise Prescription: Initial Exercise Prescription - 10/08/18 1500      Date of Initial Exercise RX and Referring Provider   Date  10/08/18    Referring Provider  Icard    Expected Discharge Date  01/08/19      Treadmill   MPH  1.2    Grade  0    Minutes  17    METs  1.91      NuStep   Level  1    SPM  70    Minutes  17    METs  2      Prescription Details   Frequency (times per week)  2    Duration  Progress to 30 minutes of continuous aerobic without signs/symptoms of physical distress      Intensity   THRR 40-80% of Max Heartrate  [phone]    Ratings of Perceived Exertion  11-13    Perceived Dyspnea  0-4      Progression   Progression  Continue to progress workloads to maintain intensity without signs/symptoms of physical distress.      Resistance Training   Training Prescription  Yes    Weight  1    Reps  10-15       Perform Capillary Blood Glucose checks as needed.  Exercise Prescription Changes:  Exercise Prescription  Changes    Row Name 10/08/18 1500 10/22/18 0800 11/05/18 1500 11/19/18 1200       Response to Exercise   Blood Pressure (Admit)  -  144/72  184/70  160/60    Blood Pressure (Exercise)  -  164/64  190/70  180/70    Blood Pressure (Exit)  -  138/68  150/70  138/60    Heart Rate (Admit)  -  72 bpm  71 bpm  77 bpm    Heart Rate (Exercise)  -  80 bpm  95 bpm  91 bpm    Heart Rate (Exit)  -  73 bpm  84 bpm  83 bpm    Oxygen Saturation (Admit)  -  95 %  96 %  95 %    Oxygen Saturation (Exercise)  -  94 %  93 %  92 %    Oxygen Saturation (Exit)  -  96 %  97 %  96 %    Rating of Perceived Exertion (Exercise)  -  11  11  13     Perceived Dyspnea (Exercise)  -  11  13  13     Comments  -  First two weeks of exercise sessions   increase in overall workload  -    Duration  -  Progress to 30 minutes of  aerobic without signs/symptoms of physical distress  Progress to 30 minutes of  aerobic without signs/symptoms of physical distress  Continue with 30 min of aerobic exercise without signs/symptoms of physical distress.    Intensity  -  THRR New [phone]  THRR unchanged  THRR unchanged      Progression   Progression  -  Continue to progress workloads to maintain intensity without signs/symptoms of physical distress.  Continue to progress workloads to maintain intensity without signs/symptoms of physical distress.  Continue to progress workloads to maintain intensity without signs/symptoms of physical distress.    Average METs  -  1.76  2.14  2.24      Resistance Training   Training Prescription  -  Yes  Yes  Yes    Weight  -  1  2  2     Reps  -  10-15  10-15  10-15      Treadmill   MPH  -  1.2  1.8  2.2    Grade  -  0  0  0    Minutes  -  17  17  17     METs  -  1.92  2.37  2.68      NuStep   Level  -  1  2  2     SPM  -  70  118  122    Minutes  -  22  22  22     METs  -  1.6  1.9  1.8      Home Exercise Plan   Plans to continue exercise at  Home (comment)  Home (comment)  Home (comment)   Home (comment)    Frequency  Add 3 additional days to program exercise sessions.  Add 3 additional days to program exercise sessions.  Add 3 additional days to program exercise sessions.  Add 3 additional days to program exercise sessions.    Initial Home Exercises Provided  10/08/18  10/08/18  10/08/18  10/08/18       Exercise Comments:  Exercise Comments    Row Name 11/05/18 1522 11/26/18 0749         Exercise Comments  Pt. has done extremely well in the program. She has tolerated exercise with ease and is always eager for progressions and increases. We will continue to monitor her progress and Progress as needed.   Pt. has done great upon returning after a 2 week break for the holidays. She is up to 2.2MPH on the Treadmill and level 2 on the NuStep. She is working hard towards her goal of being able to play an entire round of golf without SOB.          Exercise Goals and Review:  Exercise Goals    Row Name 10/08/18 1506             Exercise Goals   Increase Physical  Activity  Yes       Intervention  Provide advice, education, support and counseling about physical activity/exercise needs.;Develop an individualized exercise prescription for aerobic and resistive training based on initial evaluation findings, risk stratification, comorbidities and participant's personal goals.       Expected Outcomes  Short Term: Attend rehab on a regular basis to increase amount of physical activity.       Increase Strength and Stamina  Yes       Intervention  Provide advice, education, support and counseling about physical activity/exercise needs.;Develop an individualized exercise prescription for aerobic and resistive training based on initial evaluation findings, risk stratification, comorbidities and participant's personal goals.       Expected Outcomes  Short Term: Increase workloads from initial exercise prescription for resistance, speed, and METs.       Able to understand and use rate of  perceived exertion (RPE) scale  Yes       Intervention  Provide education and explanation on how to use RPE scale       Expected Outcomes  Short Term: Able to use RPE daily in rehab to express subjective intensity level;Long Term:  Able to use RPE to guide intensity level when exercising independently       Able to understand and use Dyspnea scale  Yes       Intervention  Provide education and explanation on how to use Dyspnea scale       Expected Outcomes  Long Term: Able to use Dyspnea scale to guide intensity level when exercising independently;Short Term: Able to use Dyspnea scale daily in rehab to express subjective sense of shortness of breath during exertion       Knowledge and understanding of Target Heart Rate Range (THRR)  Yes       Intervention  Provide education and explanation of THRR including how the numbers were predicted and where they are located for reference       Expected Outcomes  Short Term: Able to state/look up THRR;Long Term: Able to use THRR to govern intensity when exercising independently;Short Term: Able to use daily as guideline for intensity in rehab       Able to check pulse independently  Yes       Intervention  Provide education and demonstration on how to check pulse in carotid and radial arteries.;Review the importance of being able to check your own pulse for safety during independent exercise       Expected Outcomes  Short Term: Able to explain why pulse checking is important during independent exercise;Long Term: Able to check pulse independently and accurately       Understanding of Exercise Prescription  Yes       Intervention  Provide education, explanation, and written materials on patient's individual exercise prescription       Expected Outcomes  Short Term: Able to explain program exercise prescription;Long Term: Able to explain home exercise prescription to exercise independently          Exercise Goals Re-Evaluation : Exercise Goals Re-Evaluation     Row Name 11/05/18 1520 11/26/18 0748           Exercise Goal Re-Evaluation   Exercise Goals Review  Increase Physical Activity;Increase Strength and Stamina;Able to understand and use rate of perceived exertion (RPE) scale;Able to understand and use Dyspnea scale;Knowledge and understanding of Target Heart Rate Range (THRR);Able to check pulse independently;Understanding of Exercise Prescription  Increase Physical Activity;Increase Strength and Stamina;Able to understand and use  rate of perceived exertion (RPE) scale;Able to understand and use Dyspnea scale;Knowledge and understanding of Target Heart Rate Range (THRR);Able to check pulse independently;Understanding of Exercise Prescription      Comments  Pt. has done well in the program, she has attended 8 sessions so far. She is very eager to come every day and work to increase strength and stamina.   Pt. continues to do great in the program. She was out for 2 weeks due to the holiday season but returned just as spunky and ready to work as ever. She is eager to increase her stength and stamina. We will continue to progress her workloads as tolerated.       Expected Outcomes  increase strength, imrpove ability to perform ADL's without SOB   increase strength, imrpove ability to perform ADL's without SOB          Discharge Exercise Prescription (Final Exercise Prescription Changes): Exercise Prescription Changes - 11/19/18 1200      Response to Exercise   Blood Pressure (Admit)  160/60    Blood Pressure (Exercise)  180/70    Blood Pressure (Exit)  138/60    Heart Rate (Admit)  77 bpm    Heart Rate (Exercise)  91 bpm    Heart Rate (Exit)  83 bpm    Oxygen Saturation (Admit)  95 %    Oxygen Saturation (Exercise)  92 %    Oxygen Saturation (Exit)  96 %    Rating of Perceived Exertion (Exercise)  13    Perceived Dyspnea (Exercise)  13    Duration  Continue with 30 min of aerobic exercise without signs/symptoms of physical distress.     Intensity  THRR unchanged      Progression   Progression  Continue to progress workloads to maintain intensity without signs/symptoms of physical distress.    Average METs  2.24      Resistance Training   Training Prescription  Yes    Weight  2    Reps  10-15      Treadmill   MPH  2.2    Grade  0    Minutes  17    METs  2.68      NuStep   Level  2    SPM  122    Minutes  22    METs  1.8      Home Exercise Plan   Plans to continue exercise at  Home (comment)    Frequency  Add 3 additional days to program exercise sessions.    Initial Home Exercises Provided  10/08/18       Nutrition:  Target Goals: Understanding of nutrition guidelines, daily intake of sodium 1500mg , cholesterol 200mg , calories 30% from fat and 7% or less from saturated fats, daily to have 5 or more servings of fruits and vegetables.  Biometrics: Pre Biometrics - 10/08/18 1506      Pre Biometrics   Height  5\' 1"  (1.549 m)    Weight  53.8 kg    Waist Circumference  28 inches    Hip Circumference  34.5 inches    Waist to Hip Ratio  0.81 %    BMI (Calculated)  22.42    Triceps Skinfold  14 mm    % Body Fat  31.7 %    Grip Strength  17.03 kg    Flexibility  0 in    Single Leg Stand  1.5 seconds        Nutrition Therapy Plan  and Nutrition Goals: Nutrition Therapy & Goals - 11/26/18 1555      Nutrition Therapy   RD appointment deferred  Yes      Personal Nutrition Goals   Comments  Encouraged patient to meet with  RD in February. She says she has not changed her diet since she started the program. Will continue to monitor.       Intervention Plan   Intervention  Nutrition handout(s) given to patient.       Nutrition Assessments: Nutrition Assessments - 10/08/18 1537      MEDFICTS Scores   Pre Score  32       Nutrition Goals Re-Evaluation:   Nutrition Goals Discharge (Final Nutrition Goals Re-Evaluation):   Psychosocial: Target Goals: Acknowledge presence or absence of  significant depression and/or stress, maximize coping skills, provide positive support system. Participant is able to verbalize types and ability to use techniques and skills needed for reducing stress and depression.  Initial Review & Psychosocial Screening: Initial Psych Review & Screening - 10/08/18 [place]?  Yes      Barriers   Psychosocial barriers to participate in program  There are no identifiable barriers or psychosocial needs.      Screening Interventions   Interventions  Encouraged to exercise    Expected Outcomes  Long Term goal: The participant improves quality of Life and PHQ9 Scores as seen by post scores and/or verbalization of changes       Quality of Life Scores: Quality of Life - 10/08/18 1507      Quality of Life   Select  Quality of Life      Quality of Life Scores   Health/Function Pre  20.07 %    Socioeconomic Pre  28.75 %    Psych/Spiritual Pre  24 %    Family Pre  27 %    GLOBAL Pre  23.17 %      Scores of 19 and below usually indicate a poorer quality of life in these areas.  A difference of  2-3 points is a clinically meaningful difference.  A difference of 2-3 points in the total score of the Quality of Life Index has been associated with significant improvement in overall quality of life, self-image, physical symptoms, and general health in studies assessing change in quality of life.   PHQ-9: Recent Review Flowsheet Data    Depression screen [facility] 2/9 10/28/2018 10/08/2018 09/03/2018 08/25/2018 08/17/2018   Decreased Interest 0 0 0 0 0   Down, Depressed, Hopeless 0 0 0 0 0   PHQ - 2 Score 0 0 0 0 0   Altered sleeping - 2 - - -   Tired, decreased energy - 1 - - -   Change in appetite - 1 - - -   Feeling bad or failure about yourself  - 0 - - -   Trouble concentrating - 0 - - -   Moving slowly or fidgety/restless - 0 - - -   Suicidal thoughts - 0 - - -   PHQ-9 Score - 4 - - -   Difficult doing work/chores  - Somewhat difficult - - -     Interpretation of Total Score  Total Score Depression Severity:  1-4 = Minimal depression, 5-9 = Mild depression, 10-14 = Moderate depression, 15-19 = Moderately severe depression, 20-27 = Severe depression   Psychosocial Evaluation and Intervention: Psychosocial Evaluation - 10/08/18 1536  Psychosocial Evaluation & Interventions   Interventions  Encouraged to exercise with the program and follow exercise prescription;Relaxation education;Stress management education    Comments  Patient has no psychosical barriers identified at orientation. Her initial QOL score was 23.17 and her PHQ-9 score was 4.    Expected Outcomes  Patient will have no psychosocial barriers identified at [place].     Continue Psychosocial Services   No Follow up required       Psychosocial Re-Evaluation: Psychosocial Re-Evaluation    [place] Name 11/06/18 1311 11/26/18 1558           Psychosocial Re-Evaluation   Current issues with  None Identified  None Identified      Comments  Patient's initial QOL score was 23.17 and  her PHQ-9 score was 4 with no psychosocial issues identified.   Patient's initial QOL score was 23.17 and  her PHQ-9 score was 4 with no psychosocial issues identified.       Expected Outcomes  Patient will have no psychosocial issues identified at discharge.   Patient will have no psychosocial issues identified at discharge.       Interventions  Relaxation education;Stress management education;Encouraged to attend Pulmonary Rehabilitation for the exercise  Relaxation education;Stress management education;Encouraged to attend Pulmonary Rehabilitation for the exercise      Continue Psychosocial Services   No Follow up required  No Follow up required         Psychosocial Discharge (Final Psychosocial Re-Evaluation): Psychosocial Re-Evaluation - 11/26/18 1558      Psychosocial Re-Evaluation   Current issues with  None Identified    Comments  Patient's initial  QOL score was 23.17 and  her PHQ-9 score was 4 with no psychosocial issues identified.     Expected Outcomes  Patient will have no psychosocial issues identified at discharge.     Interventions  Relaxation education;Stress management education;Encouraged to attend Pulmonary Rehabilitation for the exercise    Continue Psychosocial Services   No Follow up required        Education: Education Goals: Education classes will be provided on a weekly basis, covering required topics. Participant will state understanding/return demonstration of topics presented.  Learning Barriers/Preferences: Learning Barriers/Preferences - 10/08/18 1537      Learning Barriers/Preferences   Learning Barriers  None    Learning Preferences  Written Material       Education Topics: How Lungs Work and Diseases: - Discuss the anatomy of the lungs and diseases that can affect the lungs, such as COPD.   Exercise: -Discuss the importance of exercise, FITT principles of exercise, normal and abnormal responses to exercise, and how to exercise safely.   Environmental Irritants: -Discuss types of environmental irritants and how to limit exposure to environmental irritants.   Meds/Inhalers and oxygen: - Discuss respiratory medications, definition of an inhaler and oxygen, and the proper way to use an inhaler and oxygen.   Energy Saving Techniques: - Discuss methods to conserve energy and decrease shortness of breath when performing activities of daily living.    Bronchial Hygiene / Breathing Techniques: - Discuss breathing mechanics, pursed-lip breathing technique,  proper posture, effective ways to clear airways, and other functional breathing techniques   PULMONARY REHAB OTHER RESPIRATORY from 11/26/2018 in [place]  Date  10/22/18  Educator  Wynetta Emery  Instruction Review Code  2- Demonstrated Understanding      Cleaning Equipment: - Provides group verbal and written instruction  about the health risks of elevated stress, cause of high  stress, and healthy ways to reduce stress.   PULMONARY REHAB OTHER RESPIRATORY from 11/26/2018 in [place]  Date  10/29/18  Educator  Wynetta Emery  Instruction Review Code  2- Demonstrated Understanding      Nutrition I: Fats: - Discuss the types of cholesterol, what cholesterol does to the body, and how cholesterol levels can be controlled.   PULMONARY REHAB OTHER RESPIRATORY from 11/26/2018 in [place]  Date  11/05/18  Educator  Wynetta Emery  Instruction Review Code  2- Demonstrated Understanding      Nutrition II: Labels: -Discuss the different components of food labels and how to read food labels.   PULMONARY REHAB OTHER RESPIRATORY from 11/26/2018 in [place]  Date  11/19/18  Educator  Wynetta Emery  Instruction Review Code  2- Demonstrated Understanding      Respiratory Infections: - Discuss the signs and symptoms of respiratory infections, ways to prevent respiratory infections, and the importance of seeking medical treatment when having a respiratory infection.   PULMONARY REHAB OTHER RESPIRATORY from 11/26/2018 in [place]  Date  11/26/18  Educator  Wynetta Emery  Instruction Review Code  2- Demonstrated Understanding      Stress I: Signs and Symptoms: - Discuss the causes of stress, how stress may lead to anxiety and depression, and ways to limit stress.   Stress II: Relaxation: -Discuss relaxation techniques to limit stress.   Oxygen for Home/Travel: - Discuss how to prepare for travel when on oxygen and proper ways to transport and store oxygen to ensure safety.   Knowledge Questionnaire Score: Knowledge Questionnaire Score - 10/08/18 1538      Knowledge Questionnaire Score   Pre Score  14/18       Core Components/Risk Factors/Patient Goals at Admission: Personal Goals and Risk Factors at Admission - 10/08/18 1538       Core Components/Risk Factors/Patient Goals on Admission    Weight Management  Weight Maintenance    Improve shortness of breath with ADL's  Yes    Intervention  Provide education, individualized exercise plan and daily activity instruction to help decrease symptoms of SOB with activities of daily living.    Expected Outcomes  Short Term: Improve cardiorespiratory fitness to achieve a reduction of symptoms when performing ADLs;Long Term: Be able to perform more ADLs without symptoms or delay the onset of symptoms    Hypertension  Yes    Intervention  Provide education on lifestyle modifcations including regular physical activity/exercise, weight management, moderate sodium restriction and increased consumption of fresh fruit, vegetables, and low fat dairy, alcohol moderation, and smoking cessation.;Monitor prescription use compliance.    Expected Outcomes  Short Term: Continued assessment and intervention until BP is < 140/31mm HG in hypertensive participants. < 130/32mm HG in hypertensive participants with diabetes, heart failure or chronic kidney disease.;Long Term: Maintenance of blood pressure at goal levels.    Personal Goal Other  Yes    Personal Goal  Breathe better. Has less SOB with activities. Gain strength and stamina. Get back to playing golf.     Intervention  Patient will attend PR 2 days/week and supplement with exercise 3 days/week.     Expected Outcomes  Patient will complete the program and meet her personal goals.        Core Components/Risk Factors/Patient Goals Review:  Goals and Risk Factor Review    Row Name 11/06/18 1309 11/26/18 1556           Core Components/Risk Factors/Patient  Goals Review   Personal Goals Review  Weight Management/Obesity;Hypertension;Improve shortness of breath with ADL's Breathe better; gain strength; do activities w/o SOB; golf again.   Weight Management/Obesity;Hypertension;Improve shortness of breath with ADL's Breathe better; gain strength; do  activities w/o SOB; golf again.       Review  Patient has completed 8 sessions losing 1.5 lbs since her initial visit. She is doing well in the program with progression. She says she does feel stronger and has more energy. She feels like she will be ready to golf in the Spring if she continues the program. Her breathing has improved. Will continue to monitor for progress.   Patient has completed 11 sessions maintaining her weight since last 30 day review. She continues to do well in the program with progression. She says her energy continues to improve and she is breathing better. She feels like she will be ready to golf soon. Will continue to monitor for progress.       Expected Outcomes  Patient will continue to attend sessions and complete the program meeting her personal goals.   Patient will continue to attend sessions and complete the program meeting her personal goals.          Core Components/Risk Factors/Patient Goals at Discharge (Final Review):  Goals and Risk Factor Review - 11/26/18 1556      Core Components/Risk Factors/Patient Goals Review   Personal Goals Review  Weight Management/Obesity;Hypertension;Improve shortness of breath with ADL's   Breathe better; gain strength; do activities w/o SOB; golf again.    Review  Patient has completed 11 sessions maintaining her weight since last 30 day review. She continues to do well in the program with progression. She says her energy continues to improve and she is breathing better. She feels like she will be ready to golf soon. Will continue to monitor for progress.     Expected Outcomes  Patient will continue to attend sessions and complete the program meeting her personal goals.        ITP Comments: ITP Comments    Row Name 10/19/18 1450           ITP Comments  Patient new to program. She has completed 2 sessions. Will continue to monitor for progress.           Comments: ITP REVIEW Pt is making expected progress toward  pulmonary rehab goals after completing 11 sessions. Recommend continued exercise, life style modification, education, and utilization of breathing techniques to increase stamina and strength and decrease shortness of breath with exertion.

## 2018-11-27 ENCOUNTER — Encounter: Payer: Self-pay | Admitting: Pulmonary Disease

## 2018-11-27 ENCOUNTER — Ambulatory Visit: Payer: Medicare Other | Admitting: Pulmonary Disease

## 2018-11-27 VITALS — BP 132/78 | HR 68 | Ht 61.0 in | Wt 116.2 lb

## 2018-11-27 DIAGNOSIS — J849 Interstitial pulmonary disease, unspecified: Secondary | ICD-10-CM | POA: Diagnosis not present

## 2018-11-27 DIAGNOSIS — M069 Rheumatoid arthritis, unspecified: Secondary | ICD-10-CM

## 2018-11-27 NOTE — Progress Notes (Signed)
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  Subjective:   PATIENT ID: Susan Fitzgerald GENDER: female DOB: December 27, 1931, MRN: [id number]  Chief Complaint  Patient presents with  . Follow-up    She has started Pulmonary Rehab and emjoys it. She states she does see a improvement in her SOB since starting rehab.     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   Over the past year has notice her SOB. However of the past several months her SOB has been limiting her activity. She was an avid golfer and has recently been unable due to her SOB. She was started on prednisone for costochondritis back in June. During that time while she was on prednisone she felt much better. Started on amiodarone around 2 years ago in 2017, currently taking 2107m daily, Higher loading dose at start.  Records from 05/25/2018, Dr. BAmil Amen rheumatology.  Patient had mild RF positive, positive anti-CCP, hand synovitis on exam, hand x-rays May 2017 with right ulnar styloid and early erosions of the right wrist and the second MCP.  She was treated for some time with methotrexate once weekly.  This has been stopped currently after having recent elevation in serum creatinine.  OV 07/08/2018 -since she was last seen in the office we obtained a high-resolution CT scan.  Per radiology there was evidence of congestive heart failure.  She was since followed up with cardiology and they increased her diuretic regimen.  She was started on oxygen therapy after her last visit.  OV 08/10/2018: She was started on tapering prednisone dose at her last visit.  Unfortunately on 07/26/2018 patient suffered a fall as well as a large skin tear along the left anterior tibia.  She recently completed her last dose  of prednisone this past Thursday.  She is now feeling like she is almost back to normal.  She does feel little weak.  She had no longer been using her oxygen as she is not felt like she is needed it.  She has been checking her pulse ox at home all of which have been in the mid 90s.  She denies dyspnea on exertion and shortness of breath cough or hemoptysis.  OV 11/27/2018: Patient seen in the office today after enrollment in pulmonary rehabilitation.  She has been doing well.  Her functional status has greatly improved since we last met.  She has been going to pulmonary rehab twice a week.  At this point time she is no longer requiring oxygen her sats maintained in the mid 90s even with significant exertion during pulmonary rehab.  After that she is normally going shopping on her own at this time.  Her husband is very glad to see that she is almost back to her baseline state after we initially met this past summer.  She has been restarted on methotrexate by her rheumatologist.  She did not tolerate the leflunomide due to significant hair loss.  Otherwise she has no significant respiratory complaints.  Past Medical History:  Diagnosis Date  . Arthritis   . Bradycardia 2014  . Calcification of bronchial airway 06/23/2018   01/2017 - See on CT imaging of the chest   . Cataract   . Cerebral vascular disease 06/19/2018   06/19/2018 - MRI with chronic  microvascular ischemic change  . Diverticulosis   . Dyspnea    periodically  . Dysrhythmia    a - fib  . Heart murmur    ??? bruit  . History of stress test    a. 04/09/13-nuclear stress-no ischemia low risk  . Hypertension   . Menopause   . Osteoporosis   . PAF (paroxysmal atrial fibrillation) ([place])    a. s/p prior DCCV; b. On flecainide & coumadin (CHA2DS2VASc = 4);  c. 03/2016 Echo: EF 55-60%, mod LVH, mod AI, mild to mod TR, PASP 45mHg.  .Marland KitchenPelvic fracture (HCC)      Family History  Problem Relation Age of Onset  . Stroke Mother        cerebral  hemorrhage  . Heart disease Father        MI  . Heart attack Father   . Vision loss Father   . Heart disease Brother   . Heart attack Brother   . Cancer Maternal Aunt        breast  . Cancer Brother   . Heart disease Brother   . Cancer Daughter        breast    Past Surgical History:  Procedure Laterality Date  . BIOPSY BREAST    . BIOPSY BREAST     right & benign  . BREAST SURGERY    . CARDIOVERSION N/A 10/30/2016   Procedure: CARDIOVERSION;  Surgeon: DLarey Dresser MD;  [facility]  Service: Cardiovascular;  Laterality: N/A;  . CARDIOVERSION N/A 08/20/2017   Procedure: CARDIOVERSION;  Surgeon: SJerline Pain MD;  [facility];  Service: Cardiovascular;  Laterality: N/A;  . CATARACT EXTRACTION    . EYE SURGERY     cataracts  . TEE WITHOUT CARDIOVERSION N/A 10/30/2016   Procedure: TRANSESOPHAGEAL ECHOCARDIOGRAM (TEE);  Surgeon: DLarey Dresser MD;  [facility]  Service: Cardiovascular;  Laterality: N/A;  . TOTAL HIP ARTHROPLASTY Right 01/27/2017   Procedure: TOTAL HIP ARTHROPLASTY ANTERIOR APPROACH;  Surgeon: BRod Can MD;  [facility]  Service: Orthopedics;  Laterality: Right;    Social History   Socioeconomic History  . Marital status: Married    Spouse name: BRush Landmark  . Number of children: 2  . Years of education: Not on file  . Highest education level: Not on file  Occupational History  . Not on file  Social Needs  . Financial resource strain: Not hard at all  . Food insecurity:    Worry: Never true    Inability: Never true  . Transportation needs:    Medical: No    Non-medical: No  Tobacco Use  . Smoking status: Never Smoker  . Smokeless tobacco: Never Used  Substance and Sexual Activity  . Alcohol use: No  . Drug use: No  . Sexual activity: Yes  Lifestyle  . Physical activity:    Days per week: 2 days    Minutes per session: 60 min  . Stress: Only a little  Relationships  . Social connections:    Talks  on phone: More than three times a week    Gets together: More than three times a week    Attends religious service: More than 4 times per year    Active member of club or organization: Yes    Attends meetings of clubs or organizations: More than 4 times per year    Relationship status: Married  . Intimate partner violence:    Fear of current or  ex partner: No    Emotionally abused: No    Physically abused: No    Forced sexual activity: No  Other Topics Concern  . Not on file  Social History Narrative  . Not on file     Allergies  Allergen Reactions  . Benazepril Hcl Cough     Outpatient Medications Prior to Visit  Medication Sig Dispense Refill  . apixaban (ELIQUIS) 2.5 MG TABS tablet Take 2.5 mg by mouth 2 (two) times daily.     . budesonide-formoterol (SYMBICORT) 80-4.5 MCG/ACT inhaler Inhale 2 puffs into the lungs 2 (two) times daily. 1 Inhaler 0  . calcium-vitamin D (OSCAL WITH D) 250-125 MG-UNIT per tablet Take 1 tablet by mouth 2 (two) times daily.    Marland Kitchen diltiazem (DILACOR XR) 180 MG 24 hr capsule Take 180 mg by mouth daily.    . folic acid (FOLVITE) 1 MG tablet Take 2 mg by mouth daily.     . furosemide (LASIX) 20 MG tablet Take 1 tablet (20 mg total) by mouth daily. 180 tablet 1  . gabapentin (NEURONTIN) 100 MG capsule Take 100 mg by mouth at bedtime.     Marland Kitchen levothyroxine (SYNTHROID, LEVOTHROID) 75 MCG tablet TAKE 1 TABLET BY MOUTH  DAILY 90 tablet 3  . lisinopril-hydrochlorothiazide (PRINZIDE,ZESTORETIC) 20-12.5 MG tablet Take 1 tablet by mouth daily. 90 tablet 3  . Melatonin 5 MG TABS Take 5 mg by mouth at bedtime.    . methotrexate (RHEUMATREX) 5 MG tablet Take 20 mg by mouth once a week. Caution: Chemotherapy. Protect from light.    . Multiple Vitamin (MULTIVITAMIN) capsule Take 1 capsule by mouth daily.    . potassium chloride SA (KLOR-CON M20) 20 MEQ tablet Take 1 tablet (20 mEq total) by mouth daily. 90 tablet 1  . Spacer/Aero-Holding Chambers (AEROCHAMBER MV)  inhaler Use as instructed 1 each 0  . guaiFENesin (MUCINEX) 600 MG 12 hr tablet Take 600 mg by mouth 2 (two) times daily as needed.     . potassium chloride (K-DUR) 10 MEQ tablet Take 10 mEq by mouth daily.     Facility-Administered Medications Prior to Visit  Medication Dose Route Frequency Provider Last Rate Last Dose  . cyanocobalamin ((VITAMIN B-12)) injection 1,000 mcg  1,000 mcg Intramuscular Q30 days Chipper Herb, MD   1,000 mcg at 11/06/18 1219    Review of Systems  Constitutional: Negative for chills, fever, malaise/fatigue and weight loss.  HENT: Negative for hearing loss, sore throat and tinnitus.   Eyes: Negative for blurred vision and double vision.  Respiratory: Negative for cough, hemoptysis, sputum production, shortness of breath, wheezing and stridor.   Cardiovascular: Negative for chest pain, palpitations, orthopnea, leg swelling and PND.  Gastrointestinal: Negative for abdominal pain, constipation, diarrhea, heartburn, nausea and vomiting.  Genitourinary: Negative for dysuria, hematuria and urgency.  Musculoskeletal: Negative for joint pain and myalgias.  Skin: Negative for itching and rash.  Neurological: Negative for dizziness, tingling, weakness and headaches.  Endo/Heme/Allergies: Negative for environmental allergies. Does not bruise/bleed easily.  Psychiatric/Behavioral: Negative for depression. The patient is not nervous/anxious and does not have insomnia.   All other systems reviewed and are negative.   Objective:  Physical Exam Vitals signs reviewed.  Constitutional:      General: She is not in acute distress.    Appearance: She is well-developed.     Comments: Thin, decreased muscle mass  HENT:     Head: Normocephalic and atraumatic.  Eyes:     General: No  scleral icterus.    Conjunctiva/sclera: Conjunctivae normal.     Pupils: Pupils are equal, round, and reactive to light.  Neck:     Musculoskeletal: Neck supple.     Vascular: No JVD.      Trachea: No tracheal deviation.  Cardiovascular:     Rate and Rhythm: Normal rate and regular rhythm.     Heart sounds: Normal heart sounds. No murmur.  Pulmonary:     Effort: Pulmonary effort is normal. No tachypnea, accessory muscle usage or respiratory distress.     Breath sounds: No stridor. No wheezing, rhonchi or rales.     Comments: No crackles Abdominal:     General: Bowel sounds are normal. There is no distension.     Palpations: Abdomen is soft.     Tenderness: There is no abdominal tenderness.  Musculoskeletal:        General: No tenderness.     Comments: Decreased muscle mass  Lymphadenopathy:     Cervical: No cervical adenopathy.  Skin:    General: Skin is warm and dry.     Capillary Refill: Capillary refill takes less than 2 seconds.     Findings: No rash.  Neurological:     Mental Status: She is alert and oriented to person, place, and time.  Psychiatric:        Behavior: Behavior normal.     Vitals:   11/27/18 1053  BP: 132/78  Pulse: 68  SpO2: 97%  Weight: 116 lb 3.2 oz (52.7 kg)  Height: '5\' 1"'  (1.549 m)   97% on RA  BMI Readings from Last 3 Encounters:  11/27/18 21.96 kg/m  10/28/18 21.88 kg/m  10/08/18 22.41 kg/m   Wt Readings from Last 3 Encounters:  11/27/18 116 lb 3.2 oz (52.7 kg)  10/28/18 115 lb 12.8 oz (52.5 kg)  10/08/18 118 lb 9.7 oz (53.8 kg)    CBC    Component Value Date/Time   WBC 5.2 11/06/2018 0000   WBC 4.8 08/15/2017 1206   RBC 3.68 (L) 11/06/2018 0000   RBC 4.05 08/15/2017 1206   HGB 11.4 11/06/2018 0000   HCT 34.8 11/06/2018 0000   PLT 177 11/06/2018 0000   MCV 95 11/06/2018 0000   MCH 31.0 11/06/2018 0000   MCH 30.6 08/15/2017 1206   MCHC 32.8 11/06/2018 0000   MCHC 32.0 08/15/2017 1206   RDW 13.0 11/06/2018 0000   LYMPHSABS 1.3 11/06/2018 0000   MONOABS 0.8 02/10/2017 0332   EOSABS 0.1 11/06/2018 0000   BASOSABS 0.1 11/06/2018 0000   Lab Results  Component Value Date   ALT 80 (H) 06/08/2018   AST 78 (H)  06/08/2018   ALKPHOS 63 06/08/2018   BILITOT 0.4 06/08/2018   Lab Results  Component Value Date   ESRSEDRATE 18 06/16/2018   Lab Results  Component Value Date   CREATININE 1.21 (H) 11/06/2018    Chest Imaging: CT imaging from 2017 was reviewed with basilar intersitial changes (my read)  CXR from 05/2018 reviewed with bilateral prominent interstitial markings. (my read)    High-resolution CT from August 2019 Evidence of interlobular septal thickening and patchy ground glass opacities.  There is evidence in the upper lobes as well as the lower lobes however there is small amounts of bilateral pleural fluid.  This could represent pulmonary edema and decompensation of her known heart failure.  However the upper lobe predominance and is also seen more in the right than the left.  This could represent drug-induced disease. The patient's  images have been independently reviewed by me.    Pulmonary Functions Testing Results:  07/07/2018 TLC - 56%  6MWT: Distance 288 meters, 2LNC   FeNO: None   Pathology: None   Echocardiogram:  01/27/2018 Study Conclusions  - Left ventricle: The cavity size was normal. Wall thickness was   increased in a pattern of mild LVH. There was mild focal basal   hypertrophy of the septum. Systolic function was normal. The   estimated ejection fraction was in the range of 55% to 60%. Wall   motion was normal; there were no regional wall motion   abnormalities. Doppler parameters are consistent with abnormal   left ventricular relaxation (grade 1 diastolic dysfunction). - Aortic valve: There was mild regurgitation. - Ascending aorta: The ascending aorta was mildly dilated. - Mitral valve: Calcified annulus. - Pulmonary arteries: Systolic pressure was mildly increased.  Impressions:  - Normal LV systolic function; mild diastolic dysfunction; mild   LVH; calcified aortic valve with mild AI; mildly dilated   ascending aorta; mild TR; mild pulmonary  hypertension.  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  This was a presumed diagnosis after cessation of amiodarone and initiation of prednisone therapy she had significant improvement in her respiratory complaints as well as functional status.  She is now no no longer oxygen dependent.  She is however recently started on methotrexate back again by her rheumatologist.  We discussed this regarding her chances of development of lung disease.  Unfortunately there is no way to tell whether or not methotrexate played a role in her lung disease.  I do not suspect this as she has been on it for some time with no trouble.  And she has also had no trouble with reinitiation of methotrexate at this time.  Therefore think we should continue to clinically observe.  We will recommend the following: We will obtain high-resolution CT imaging 6 months from her last in August 2019.  Therefore imaging should be scheduled in mid February. We should also obtain pulmonary function test.  She had much difficulty with the last attempted pulmonary function test however I think that she will do better as her respiratory status has also improved.  Patient to follow-up with [place] in 6 to 8 weeks after imaging and PFTs have been completed.  I also encouraged her to restart her activities of daily living.  She is also planning to enroll in her first golf tournament in the springtime.  Greater than 50% of this patient's 25-minute office visit was spent face-to-face discussing the above recommendations and treatment plan as well as reviewing her progression with pulmonary rehab.   Current Outpatient Medications:  .  apixaban  (ELIQUIS) 2.5 MG TABS tablet, Take 2.5 mg by mouth 2 (two) times daily. , Disp: , Rfl:  .  budesonide-formoterol (SYMBICORT) 80-4.5 MCG/ACT inhaler, Inhale 2 puffs into the lungs 2 (two) times daily., Disp: 1 Inhaler, Rfl: 0 .  calcium-vitamin D (OSCAL WITH D) 250-125 MG-UNIT per tablet, Take 1 tablet by mouth 2 (two) times daily., Disp: , Rfl:  .  diltiazem (DILACOR XR) 180 MG 24 hr capsule, Take 180 mg by mouth daily., Disp: , Rfl:  .  folic acid (FOLVITE) 1 MG tablet, Take 2  mg by mouth daily. , Disp: , Rfl:  .  furosemide (LASIX) 20 MG tablet, Take 1 tablet (20 mg total) by mouth daily., Disp: 180 tablet, Rfl: 1 .  gabapentin (NEURONTIN) 100 MG capsule, Take 100 mg by mouth at bedtime. , Disp: , Rfl:  .  levothyroxine (SYNTHROID, LEVOTHROID) 75 MCG tablet, TAKE 1 TABLET BY MOUTH  DAILY, Disp: 90 tablet, Rfl: 3 .  lisinopril-hydrochlorothiazide (PRINZIDE,ZESTORETIC) 20-12.5 MG tablet, Take 1 tablet by mouth daily., Disp: 90 tablet, Rfl: 3 .  Melatonin 5 MG TABS, Take 5 mg by mouth at bedtime., Disp: , Rfl:  .  methotrexate (RHEUMATREX) 5 MG tablet, Take 20 mg by mouth once a week. Caution: Chemotherapy. Protect from light., Disp: , Rfl:  .  Multiple Vitamin (MULTIVITAMIN) capsule, Take 1 capsule by mouth daily., Disp: , Rfl:  .  potassium chloride SA (KLOR-CON M20) 20 MEQ tablet, Take 1 tablet (20 mEq total) by mouth daily., Disp: 90 tablet, Rfl: 1 .  Spacer/Aero-Holding Chambers (AEROCHAMBER MV) inhaler, Use as instructed, Disp: 1 each, Rfl: 0  Current Facility-Administered Medications:  .  cyanocobalamin ((VITAMIN B-12)) injection 1,000 mcg, 1,000 mcg, Intramuscular, Q30 days, Chipper Herb, MD, 1,000 mcg at 11/06/18 [place], DO [facility] 11/27/2018 11:00 AM

## 2018-11-27 NOTE — Patient Instructions (Addendum)
Thank you for visiting Dr. Valeta Harms at [facility]. Today we recommend the following:  Orders Placed This Encounter  Procedures  . CT Chest High Resolution  . Pulmonary function test   Return in about 7 weeks (around 01/15/2019).

## 2018-11-30 DIAGNOSIS — M0579 Rheumatoid arthritis with rheumatoid factor of multiple sites without organ or systems involvement: Secondary | ICD-10-CM | POA: Diagnosis not present

## 2018-11-30 DIAGNOSIS — M5136 Other intervertebral disc degeneration, lumbar region: Secondary | ICD-10-CM | POA: Diagnosis not present

## 2018-11-30 DIAGNOSIS — M150 Primary generalized (osteo)arthritis: Secondary | ICD-10-CM | POA: Diagnosis not present

## 2018-11-30 DIAGNOSIS — M5030 Other cervical disc degeneration, unspecified cervical region: Secondary | ICD-10-CM | POA: Diagnosis not present

## 2018-12-01 ENCOUNTER — Encounter (HOSPITAL_COMMUNITY)
Admission: RE | Admit: 2018-12-01 | Discharge: 2018-12-01 | Disposition: A | Discharge status: Home / Self Care | Payer: Medicare Other | Source: Ambulatory Visit | Attending: Pulmonary Disease | Admitting: Pulmonary Disease

## 2018-12-01 DIAGNOSIS — J849 Interstitial pulmonary disease, unspecified: Secondary | ICD-10-CM

## 2018-12-01 NOTE — Progress Notes (Signed)
Daily Session Note  Patient Details  Name: Susan Fitzgerald MRN: [id number] Date of Birth: 03-02-1932 Referring Provider:     PULMONARY REHAB OTHER RESP ORIENTATION from 10/08/2018 in [place]  Referring Provider  Icard      Encounter Date: 12/01/2018  Check In: Session Check In - 12/01/18 1045      Check-In   Supervising physician immediately available to respond to emergencies  See telemetry face sheet for immediately available MD    Location  [facility]    Staff Present  Benay Pike, Exercise Physiologist;Shanese Riemenschneider Wynetta Emery, RN, BSN    Medication changes reported      No    Fall or balance concerns reported     No    Warm-up and Cool-down  Performed as group-led instruction    Resistance Training Performed  Yes    VAD Patient?  No    PAD/SET Patient?  No      Pain Assessment   Currently in Pain?  No/denies    Pain Score  0-No pain    Multiple Pain Sites  No       Capillary Blood Glucose: No results found for this or any previous visit (from the past 24 hour(s)).    Social History   Tobacco Use  Smoking Status Never Smoker  Smokeless Tobacco Never Used    Goals Met:  Proper associated with RPD/PD & O2 Sat Independence with exercise equipment Improved SOB with ADL's Using PLB without cueing & demonstrates good technique Exercise tolerated well No report of cardiac concerns or symptoms Strength training completed today  Goals Unmet:  Not Applicable  Comments: Pt able to follow exercise prescription today without complaint.  Will continue to monitor for progression. Check out 1145.   Dr. Sinda Du is Medical Director for [facility] Pul[facility] Re[facility].

## 2018-12-03 ENCOUNTER — Telehealth: Payer: Self-pay | Admitting: Family Medicine

## 2018-12-03 ENCOUNTER — Other Ambulatory Visit: Payer: Self-pay | Admitting: *Deleted

## 2018-12-03 ENCOUNTER — Encounter (HOSPITAL_COMMUNITY)
Admission: RE | Admit: 2018-12-03 | Discharge: 2018-12-03 | Disposition: A | Discharge status: Home / Self Care | Payer: Medicare Other | Source: Ambulatory Visit | Attending: Pulmonary Disease | Admitting: Pulmonary Disease

## 2018-12-03 DIAGNOSIS — R739 Hyperglycemia, unspecified: Secondary | ICD-10-CM

## 2018-12-03 DIAGNOSIS — J849 Interstitial pulmonary disease, unspecified: Secondary | ICD-10-CM

## 2018-12-03 NOTE — Progress Notes (Signed)
Daily Session Note  Patient Details  Name: DALARY HOLLAR MRN: [id number] Date of Birth: 1932/09/28 Referring Provider:     PULMONARY REHAB OTHER RESP ORIENTATION from 10/08/2018 in [place]  Referring Provider  Icard      Encounter Date: 12/03/2018  Check In: Session Check In - 12/03/18 1045      Check-In   Supervising physician immediately available to respond to emergencies  See telemetry face sheet for immediately available MD    [facility]    Staff Present  Benay Pike, Exercise Physiologist;Other    Medication changes reported      No    Fall or balance concerns reported     No    Warm-up and Cool-down  Performed as group-led instruction    Resistance Training Performed  Yes    VAD Patient?  No    PAD/SET Patient?  No      Pain Assessment   Currently in Pain?  No/denies    Pain Score  0-No pain    Multiple Pain Sites  No       Capillary Blood Glucose: No results found for this or any previous visit (from the past 24 hour(s)).    Social History   Tobacco Use  Smoking Status Never Smoker  Smokeless Tobacco Never Used    Goals Met:  Proper associated with RPD/PD & O2 Sat Independence with exercise equipment Using PLB without cueing & demonstrates good technique Exercise tolerated well No report of cardiac concerns or symptoms Strength training completed today  Goals Unmet:  Not Applicable  Comments: Pt able to follow exercise prescription today without complaint.  Will continue to monitor for progression. Check out 1145.   Dr. Sinda Du is Medical Director for [facility] Pulmonary Rehab.

## 2018-12-03 NOTE — Telephone Encounter (Signed)
Right after eating the blood sugar would be higher.  The most accurate readings are before meals 2 hours and 4 hours after eating.  Check some more readings at home record these and bring them by for [facility] to review next Wednesday.  And get blood sugar here.  Either 2 hours after eating or 4 hours after eating or fasting.  So she should get a hemoglobin A1c here.

## 2018-12-03 NOTE — Telephone Encounter (Signed)
Patient states that she had her husband check her BS this morning at it was 317. She then had him to check it again after breakfast and it was 135. Patient advised that it was probably a inaccurate reading. Patient would like to have bs checked here. I ordered BMP for patient.

## 2018-12-04 ENCOUNTER — Other Ambulatory Visit: Payer: Medicare Other

## 2018-12-04 DIAGNOSIS — R739 Hyperglycemia, unspecified: Secondary | ICD-10-CM

## 2018-12-04 LAB — BMP8+EGFR
BUN / CREAT RATIO: 16 (ref 12–28)
BUN: 20 mg/dL (ref 8–27)
CO2: 27 mmol/L (ref 20–29)
Calcium: 9.5 mg/dL (ref 8.7–10.3)
Chloride: 101 mmol/L (ref 96–106)
Creatinine, Ser: 1.29 mg/dL — ABNORMAL HIGH (ref 0.57–1.00)
GFR calc Af Amer: 44 mL/min/{1.73_m2} — ABNORMAL LOW (ref >59)
GFR calc non Af Amer: 38 mL/min/{1.73_m2} — ABNORMAL LOW (ref >59)
Glucose: 88 mg/dL (ref 65–99)
Potassium: 3.6 mmol/L (ref 3.5–5.2)
SODIUM: 143 mmol/L (ref 134–144)

## 2018-12-04 LAB — BAYER DCA HB A1C WAIVED: HB A1C (BAYER DCA - WAIVED): 5 % (ref <7.0)

## 2018-12-04 NOTE — Telephone Encounter (Signed)
A1c added to blood work drawn this morning

## 2018-12-08 ENCOUNTER — Encounter (HOSPITAL_COMMUNITY)
Admission: RE | Admit: 2018-12-08 | Discharge: 2018-12-08 | Disposition: A | Discharge status: Home / Self Care | Payer: Medicare Other | Source: Ambulatory Visit | Attending: Pulmonary Disease | Admitting: Pulmonary Disease

## 2018-12-08 DIAGNOSIS — J849 Interstitial pulmonary disease, unspecified: Secondary | ICD-10-CM | POA: Diagnosis not present

## 2018-12-08 NOTE — Progress Notes (Signed)
Daily Session Note  Patient Details  Name: CONNA TERADA MRN: [id number] Date of Birth: [birth year] Referring Provider:     PULMONARY REHAB OTHER RESP ORIENTATION from 10/08/2018 in [place]  Referring Provider  Icard      Encounter Date: 12/08/2018  Check In: Session Check In - 12/08/18 1045      Check-In   Supervising physician immediately available to respond to emergencies  See telemetry face sheet for immediately available MD    Location  [facility]    Staff Present  Russella Dar, MS, EP, [facility] Exercise Physiologist;Martine Bleecker Zachery Conch, Exercise Physiologist    Medication changes reported      No    Fall or balance concerns reported     No    Warm-up and Cool-down  Performed as group-led instruction    Resistance Training Performed  Yes    VAD Patient?  No    PAD/SET Patient?  No      Pain Assessment   Currently in Pain?  No/denies    Pain Score  0-No pain    Multiple Pain Sites  No       Capillary Blood Glucose: No results found for this or any previous visit (from the past 24 hour(s)).    Social History   Tobacco Use  Smoking Status Never Smoker  Smokeless Tobacco Never Used    Goals Met:  Proper associated with RPD/PD & O2 Sat Independence with exercise equipment Using PLB without cueing & demonstrates good technique Exercise tolerated well No report of cardiac concerns or symptoms Strength training completed today  Goals Unmet:  Not Applicable  Comments: Pt able to follow exercise prescription today without complaint.  Will continue to monitor for progression. Check out 1145.   Dr. Kate Sable is Medical Director for [facility] and Pul[facility] Rehab.

## 2018-12-09 ENCOUNTER — Encounter: Payer: Self-pay | Admitting: Family Medicine

## 2018-12-09 ENCOUNTER — Ambulatory Visit (INDEPENDENT_AMBULATORY_CARE_PROVIDER_SITE_OTHER): Payer: Medicare Other | Admitting: Family Medicine

## 2018-12-09 VITALS — BP 160/67 | HR 67 | Temp 97.0°F | Ht 61.0 in | Wt 118.0 lb

## 2018-12-09 DIAGNOSIS — I700 Atherosclerosis of aorta: Secondary | ICD-10-CM

## 2018-12-09 DIAGNOSIS — L659 Nonscarring hair loss, unspecified: Secondary | ICD-10-CM

## 2018-12-09 DIAGNOSIS — E039 Hypothyroidism, unspecified: Secondary | ICD-10-CM

## 2018-12-09 DIAGNOSIS — I10 Essential (primary) hypertension: Secondary | ICD-10-CM | POA: Diagnosis not present

## 2018-12-09 DIAGNOSIS — N184 Chronic kidney disease, stage 4 (severe): Secondary | ICD-10-CM | POA: Diagnosis not present

## 2018-12-09 DIAGNOSIS — I482 Chronic atrial fibrillation, unspecified: Secondary | ICD-10-CM

## 2018-12-09 NOTE — Progress Notes (Signed)
Subjective:    Patient ID: Susan Fitzgerald, female    DOB: [birth year], 85 y.o.   MRN: [id number]  HPI Pt here for follow up and management of chronic medical problems which includes a fib, hypothyroid and hypertension. She is taking medication regularly.  Patient has multiple medical problems and issues.  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  She is currently being followed regularly by the pulmonologist the cardiologist and the rheumatologist.  She is also on B12 injections and continues to take methotrexate.  She is on Eliquis for her atrial fibrillation.  Pulmonology wise she is followed by Dr. Valeta Harms.  She is currently using Symbicort.  She is followed by Dr. Percival Spanish for her cardiac issues.  Viewing issues with her heart and echocardiogram done in March 2019 had an ejection fraction of 55 to 60% with grade 1 diastolic dysfunction.  The patient has had some elevated blood pressure readings and has had an increase in her medication by Dr. Percival Spanish and some of the numbers are getting better.  The patient looks the best today that she has looked in a good while to me.  She does bring in blood pressure readings from rehab and some of these are high.  She is currently taking lisinopril 20/12.5 and has been on this for a while and her electrolytes were good and her creatinine is still slightly elevated.  Today she denies any chest pain pressure tightness or shortness of breath.  This is greatly improved compared to when she had the amiodarone toxicity.  She is still seeing the pulmonologist.  She denies any trouble with swallowing heartburn indigestion nausea vomiting diarrhea blood in the stool or black tarry bowel movements.  Her bowel movements have changed slightly but they are not loose  nor are they hard.  There is no blood.  She is passing her water without problems.  She looks more like the pat I used to see.  We did discuss the boost and it does have sugar and sodium and we told her that she could have a boost every now and then but not on a regular basis.  She is getting her energy level back so should not need to use this very often.  Her weight is up a couple pounds since her last visit here.  Her pulse ox was 96 and we will check the blood pressure one more time and give her sheet to continue to record blood pressure readings at home both morning and night.    Patient Active Problem List   Diagnosis Date Noted  . Medication management 10/05/2018  . Cough 09/30/2018  . Calcification of bronchial airway 06/23/2018  . Right ventricular enlargement 06/23/2018  . Interstitial pulmonary disease ([place]) 06/23/2018  . Elevated liver enzymes 06/23/2018  . Cerebral vascular disease 06/19/2018  . New onset of confusion 06/08/2018  . Fever 06/08/2018  . Weakness 02/25/2018  . Hypothyroidism 09/18/2017  . Shortness of breath 07/13/2017  . Spinal stenosis of lumbar region with neurogenic claudication 03/20/2017  . Avascular necrosis of hip, right ([place]) 01/27/2017  . Avascular necrosis of bone of right hip ([place]) 01/27/2017  . Abdominal aortic atherosclerosis ([place]) 09/24/2016  . Rheumatoid arthritis ([place]) 05/07/2016  . Symptomatic bradycardia 06/02/2013  . Hypertension 04/21/2013  . Osteoporosis 04/21/2013  . Atrial fibrillation, chronic 02/09/2013  Outpatient Encounter Medications as of 12/09/2018  Medication Sig  . apixaban (ELIQUIS) 2.5 MG TABS tablet Take 2.5 mg by mouth 2 (two) times daily.   . calcium-vitamin D (OSCAL WITH D) 250-125 MG-UNIT per tablet Take 1 tablet by mouth 2 (two) times daily.  Marland Kitchen diltiazem (DILACOR XR) 180 MG 24 hr capsule Take 180 mg by mouth daily.  . folic acid (FOLVITE) 1 MG tablet Take 2 mg by mouth daily.   . furosemide (LASIX) 20 MG tablet Take  1 tablet (20 mg total) by mouth daily.  Marland Kitchen gabapentin (NEURONTIN) 100 MG capsule Take 100 mg by mouth at bedtime.   Marland Kitchen levothyroxine (SYNTHROID, LEVOTHROID) 75 MCG tablet TAKE 1 TABLET BY MOUTH  DAILY  . lisinopril-hydrochlorothiazide (PRINZIDE,ZESTORETIC) 20-12.5 MG tablet Take 1 tablet by mouth daily.  . Melatonin 5 MG TABS Take 5 mg by mouth at bedtime.  . methotrexate (RHEUMATREX) 5 MG tablet Take 20 mg by mouth once a week. Caution: Chemotherapy. Protect from light.  . Multiple Vitamin (MULTIVITAMIN) capsule Take 1 capsule by mouth daily.  . potassium chloride SA (KLOR-CON M20) 20 MEQ tablet Take 1 tablet (20 mEq total) by mouth daily.  . [DISCONTINUED] budesonide-formoterol (SYMBICORT) 80-4.5 MCG/ACT inhaler Inhale 2 puffs into the lungs 2 (two) times daily.  . [DISCONTINUED] Spacer/Aero-Holding Chambers (AEROCHAMBER MV) inhaler Use as instructed   Facility-Administered Encounter Medications as of 12/09/2018  Medication  . cyanocobalamin ((VITAMIN B-12)) injection 1,000 mcg     Review of Systems  Constitutional: Negative.   HENT: Negative.   Eyes: Negative.   Respiratory: Negative.   Cardiovascular: Negative.   Gastrointestinal: Negative.   Endocrine: Negative.   Genitourinary: Negative.   Musculoskeletal: Negative.   Skin: Negative.   Allergic/Immunologic: Negative.   Neurological: Negative.   Hematological: Negative.   Psychiatric/Behavioral: Negative.        Objective:   Physical Exam Vitals signs and nursing note reviewed.  Constitutional:      Appearance: Normal appearance. She is well-developed and normal weight. She is not ill-appearing.     Comments: Patient looks great and is much more upbeat and like herself today.  She still has issues with a blood pressure going up and down and she will follow-up with a cardiologist next week to see if he wants to make any further changes with that and will take readings with her to that visit.  HENT:     Head: Normocephalic  and atraumatic.     Right Ear: Ear canal and external ear normal. There is impacted cerumen.     Left Ear: Ear canal and external ear normal. There is impacted cerumen.     Ears:     Comments: Bilateral ear cerumen with irrigation to remove this.    Nose: Nose normal. No congestion or rhinorrhea.     Mouth/Throat:     Mouth: Mucous membranes are moist.     Pharynx: Oropharynx is clear. No oropharyngeal exudate or posterior oropharyngeal erythema.  Eyes:     General: No scleral icterus.       Right eye: No discharge.        Left eye: No discharge.     Extraocular Movements: Extraocular movements intact.     Conjunctiva/sclera: Conjunctivae normal.     Pupils: Pupils are equal, round, and reactive to light.  Neck:     Musculoskeletal: Normal range of motion and neck supple.     Thyroid: No thyromegaly.     Vascular: No carotid bruit or JVD.  Comments: No bruits thyromegaly or anterior cervical adenopathy Cardiovascular:     Rate and Rhythm: Normal rate and regular rhythm.     Pulses: Normal pulses.     Heart sounds: Normal heart sounds. No murmur.     Comments: The heart is regular today at 72/min good pedal pulses and no edema with support hose. Pulmonary:     Effort: Pulmonary effort is normal. No respiratory distress.     Breath sounds: Normal breath sounds. No wheezing or rales.     Comments: Clear anteriorly and posteriorly with no rales rhonchi or wheezes Abdominal:     General: Abdomen is flat. Bowel sounds are normal.     Palpations: Abdomen is soft. There is no mass.     Tenderness: There is no abdominal tenderness. There is no guarding.  Genitourinary:    Vagina: No vaginal discharge.  Musculoskeletal: Normal range of motion.        General: Deformity present. No tenderness.     Right lower leg: No edema.     Left lower leg: No edema.     Comments: Slight thoracic kyphosis  Lymphadenopathy:     Cervical: No cervical adenopathy.  Skin:    General: Skin is warm  and dry.     Coloration: Skin is not pale.     Findings: No rash.  Neurological:     General: No focal deficit present.     Mental Status: She is alert and oriented to person, place, and time. Mental status is at baseline.     Cranial Nerves: No cranial nerve deficit.     Motor: No weakness.     Gait: Gait normal.     Deep Tendon Reflexes: Reflexes are normal and symmetric. Reflexes normal.  Psychiatric:        Behavior: Behavior normal.        Thought Content: Thought content normal.        Judgment: Judgment normal.     Comments: Mood affect and behavior are normal for this patient    BP (!) 160/67 (BP Location: Left Arm)   Pulse 67   Temp (!) 97 F (36.1 C) (Oral)   Ht 5\' 1"  (1.549 m)   Wt 118 lb (53.5 kg)   BMI 22.30 kg/m   Repeat blood pressure here in the office was 150/72 in the right arm sitting with a regular cuff      Assessment & Plan:  1. Hypothyroidism, unspecified type -Continue current treatment  2. Essential hypertension -Blood pressure slightly elevated today.  Patient presents with some readings that are in the 130s and some that are in the 161-096 range systolic.  3. Aortic atherosclerosis ([place]) -Continue with as aggressive therapeutic lifestyle changes as possible watching sodium closely and cholesterol closely - Lipid panel - Hepatic function panel  4. CKD (chronic kidney disease) stage 4, GFR 15-29 ml/min (HCC) -Continue to monitor creatinine and watch sodium intake closely.  5. Atrial fibrillation, chronic -Patient in normal sinus rhythm today.  No orders of the defined types were placed in this encounter.  Patient Instructions                       Medicare Annual Wellness Visit  [facility] and the medical providers at [facility] strive to bring you the best medical care.  In doing so we not only want to address your current medical conditions and concerns but also to detect new conditions early and  prevent illness,  disease and health-related problems.    Medicare offers a yearly Wellness Visit which allows our clinical staff to assess your need for preventative services including immunizations, lifestyle education, counseling to decrease risk of preventable diseases and screening for fall risk and other medical concerns.    This visit is provided free of charge (no copay) for all Medicare recipients. The clinical pharmacists at [facility] have begun to conduct these Wellness Visits which will also include a thorough review of all your medications.    As you primary medical provider recommend that you make an appointment for your Annual Wellness Visit if you have not done so already this year.  You may set up this appointment before you leave today or you may call back ([phone]) and schedule an appointment.  Please make sure when you call that you mention that you are scheduling your Annual Wellness Visit with the clinical pharmacist so that the appointment may be made for the proper length of time.     Continue current medications. Continue good therapeutic lifestyle changes which include good diet and exercise. Fall precautions discussed with patient. If an FOBT was given today- please return it to our front desk. If you are over 42 years old - you may need Prevnar 58 or the adult Pneumonia vaccine.  **Flu shots are available--- please call and schedule a FLU-CLINIC appointment**  After your visit with [place] today you will receive a survey in the mail or online from [employer] regarding your care with [place]. Please take a moment to fill this out. Your feedback is very important to [place] as you can help [place] better understand your patient needs as well as improve your experience and satisfaction. WE CARE ABOUT YOU!!!     Arrie Senate MD

## 2018-12-09 NOTE — Patient Instructions (Addendum)
Medicare Annual Wellness Visit  [facility] and the medical providers at [facility] strive to bring you the best medical care.  In doing so we not only want to address your current medical conditions and concerns but also to detect new conditions early and prevent illness, disease and health-related problems.    Medicare offers a yearly Wellness Visit which allows our clinical staff to assess your need for preventative services including immunizations, lifestyle education, counseling to decrease risk of preventable diseases and screening for fall risk and other medical concerns.    This visit is provided free of charge (no copay) for all Medicare recipients. The clinical pharmacists at [facility] have begun to conduct these Wellness Visits which will also include a thorough review of all your medications.    As you primary medical provider recommend that you make an appointment for your Annual Wellness Visit if you have not done so already this year.  You may set up this appointment before you leave today or you may call back ([phone]) and schedule an appointment.  Please make sure when you call that you mention that you are scheduling your Annual Wellness Visit with the clinical pharmacist so that the appointment may be made for the proper length of time.     Continue current medications. Continue good therapeutic lifestyle changes which include good diet and exercise. Fall precautions discussed with patient. If an FOBT was given today- please return it to our front desk. If you are over 72 years old - you may need Prevnar 34 or the adult Pneumonia vaccine.  **Flu shots are available--- please call and schedule a FLU-CLINIC appointment**  After your visit with [place] today you will receive a survey in the mail or online from [employer] regarding your care with [place]. Please take a moment to fill this out. Your feedback is very  important to [place] as you can help [place] better understand your patient needs as well as improve your experience and satisfaction. WE CARE ABOUT YOU!!!   Follow-up with cardiology as planned Follow-up with rheumatology as planned Follow-up with pulmonology as planned Drink plenty of fluids and stay well-hydrated and watch sodium intake

## 2018-12-10 ENCOUNTER — Encounter (HOSPITAL_COMMUNITY)
Admission: RE | Admit: 2018-12-10 | Discharge: 2018-12-10 | Disposition: A | Discharge status: Home / Self Care | Payer: Medicare Other | Source: Ambulatory Visit | Attending: Pulmonary Disease | Admitting: Pulmonary Disease

## 2018-12-10 DIAGNOSIS — J849 Interstitial pulmonary disease, unspecified: Secondary | ICD-10-CM | POA: Diagnosis not present

## 2018-12-10 LAB — HEPATIC FUNCTION PANEL
ALT: 18 IU/L (ref 0–32)
AST: 32 IU/L (ref 0–40)
Albumin: 4 g/dL (ref 3.6–4.6)
Alkaline Phosphatase: 107 IU/L (ref 39–117)
Bilirubin Total: 0.2 mg/dL (ref 0.0–1.2)
Bilirubin, Direct: 0.09 mg/dL (ref 0.00–0.40)
Total Protein: 6.8 g/dL (ref 6.0–8.5)

## 2018-12-10 LAB — LIPID PANEL
Chol/HDL Ratio: 2.8 ratio (ref 0.0–4.4)
Cholesterol, Total: 176 mg/dL (ref 100–199)
HDL: 63 mg/dL (ref >39)
LDL CALC: 98 mg/dL (ref 0–99)
Triglycerides: 74 mg/dL (ref 0–149)
VLDL Cholesterol Cal: 15 mg/dL (ref 5–40)

## 2018-12-10 NOTE — Progress Notes (Signed)
Daily Session Note  Patient Details  Name: Susan Fitzgerald MRN: [id number] Date of Birth: 1932-04-07 Referring Provider:     PULMONARY REHAB OTHER RESP ORIENTATION from 10/08/2018 in [facility]  Referring Provider  Icard      Encounter Date: 12/10/2018  Check In: Session Check In - 12/10/18 1045      Check-In   Supervising physician immediately available to respond to emergencies  See telemetry face sheet for immediately available MD    Location  [facility]    Staff Present  Russella Dar, MS, EP, [facility], Exercise Physiologist;Amanda Ballard, Exercise Physiologist;Kingslee Mairena Wynetta Emery, RN, BSN    Medication changes reported      No    Fall or balance concerns reported     No    Warm-up and Cool-down  Performed as group-led instruction    Resistance Training Performed  Yes    VAD Patient?  No    PAD/SET Patient?  No      Pain Assessment   Currently in Pain?  No/denies    Pain Score  0-No pain    Multiple Pain Sites  No       Capillary Blood Glucose: Results for orders placed or performed in visit on 12/09/18 (from the past 24 hour(s))  Lipid panel     Status: None   Collection Time: 12/09/18  4:35 PM  Result Value Ref Range   Cholesterol, Total 176 100 - 199 mg/dL   Triglycerides 74 0 - 149 mg/dL   HDL 63 >39 mg/dL   VLDL Cholesterol Cal 15 5 - 40 mg/dL   LDL Calculated 98 0 - 99 mg/dL   Chol/HDL Ratio 2.8 0.0 - 4.4 ratio   Narrative   Performed at:  [facility] [address], [place], [place]  [id number] Lab Director: Rush Farmer MD, Phone:  [phone]  Hepatic function panel     Status: None   Collection Time: 12/09/18  4:35 PM  Result Value Ref Range   Total Protein 6.8 6.0 - 8.5 g/dL   Albumin 4.0 3.6 - 4.6 g/dL   Bilirubin Total 0.2 0.0 - 1.2 mg/dL   Bilirubin, Direct 0.09 0.00 - 0.40 mg/dL   Alkaline Phosphatase 107 39 - 117 IU/L   AST 32 0 - 40 IU/L   ALT 18 0 - 32 IU/L   Narrative   Performed at:  [facility] [address] [place], [place]  [id number] Lab Director: Rush Farmer MD, Phone:  [phone]      Social History   Tobacco Use  Smoking Status Never Smoker  Smokeless Tobacco Never Used    Goals Met:  Proper associated with RPD/PD & O2 Sat Independence with exercise equipment Improved SOB with ADL's Using PLB without cueing & demonstrates good technique Exercise tolerated well No report of cardiac concerns or symptoms Strength training completed today  Goals Unmet:  Not Applicable  Comments: Pt able to follow exercise prescription today without complaint.  Will continue to monitor for progression. Check out 1145.   Dr. Sinda Du is Medical Director for [facility].

## 2018-12-13 NOTE — Progress Notes (Signed)
HPI The patient presents for evaluation of atrial fibrillation. She was cardioverted.  However, she developed recurrent atrial fib.  She had her flecainide does reduced because of a prolonged QT interval.  The flecainide was subsequently discontinued as she had reverted to atrial fib.   She has been followed in the atrial fib clinic.  She did describe chest pain and was sent for [facility] which did not suggest ischemia.  She does have a mildly reduced EF (40 - 45% on echo.) Her last echo in March 2019 demonstrated an improved EF of 55 - 60%.    She was started on amiodarone and underwent DCCV.  She called recently with epidsodes of weak spells.  She has been in and out of fib but she does not think that this correlates with her sensation.  She was told over the phone to reduce her pindolol.   Last year she had increased dyspnea and decreased O2 sats.  She was treated for pneumonia.  She was referred to pulmonary.  A CT was done.  There was a thought that she might have an amiodarone pulmonary process.  Other processes could not be excluded such as a reaction to her methotrexate.  There was also suggestion of pulmonary edema.  She has been treated with diuresis.   She did have an increased creat but this is improved.   She was treated with steroids and did improve.  CXR done demonstrated probable diffuse fibrosis.     She has called because of increased BP.  I reviewed extensive reports from both pulmonary rehab and from her home records.  Blood pressures fairly consistently are above 756 systolic averaging about 433 with readings in the mornings of 160s.  She even reported one episode of her systolic blood pressure above 200 at cardiac rehab.  ICU 190.  Diastolics have been okay.  Heart rates have been 70s to 90s.  She feels pretty good.  She is doing activities such as rehab and hopes to start golfing this spring.  She is not describing any new shortness of breath, PND or orthopnea.  She describes  no chest pressure, neck or arm discomfort.  She is off of oxygen which she was using previously  Allergies  Allergen Reactions  . Benazepril Hcl Cough    Current Outpatient Medications  Medication Sig Dispense Refill  . apixaban (ELIQUIS) 2.5 MG TABS tablet Take 2.5 mg by mouth 2 (two) times daily.     . calcium-vitamin D (OSCAL WITH D) 250-125 MG-UNIT per tablet Take 1 tablet by mouth 2 (two) times daily.    Marland Kitchen diltiazem (DILACOR XR) 180 MG 24 hr capsule Take 180 mg by mouth daily.    . folic acid (FOLVITE) 1 MG tablet Take 2 mg by mouth daily.     . furosemide (LASIX) 20 MG tablet Take 1 tablet (20 mg total) by mouth daily. 180 tablet 1  . gabapentin (NEURONTIN) 100 MG capsule Take 100 mg by mouth at bedtime.     Marland Kitchen levothyroxine (SYNTHROID, LEVOTHROID) 75 MCG tablet TAKE 1 TABLET BY MOUTH  DAILY 90 tablet 3  . lisinopril-hydrochlorothiazide (PRINZIDE,ZESTORETIC) 20-12.5 MG tablet Take 1 tablet by mouth daily. 90 tablet 3  . Melatonin 5 MG TABS Take 5 mg by mouth at bedtime.    . methotrexate (RHEUMATREX) 2.5 MG tablet Take 10 mg by mouth once a week. Caution:Chemotherapy. Protect from light.    . Multiple Vitamin (MULTIVITAMIN) capsule Take 1 capsule by mouth  daily.    . potassium chloride SA (KLOR-CON M20) 20 MEQ tablet Take 1 tablet (20 mEq total) by mouth daily. 90 tablet 1  . lisinopril (PRINIVIL,ZESTRIL) 20 MG tablet Take 1 tablet (20 mg total) by mouth daily. 90 tablet 3   Current Facility-Administered Medications  Medication Dose Route Frequency Provider Last Rate Last Dose  . cyanocobalamin ((VITAMIN B-12)) injection 1,000 mcg  1,000 mcg Intramuscular Q30 days Chipper Herb, MD   1,000 mcg at 12/09/18 1544    Past Medical History:  Diagnosis Date  . Arthritis   . Bradycardia 2014  . Calcification of bronchial airway 06/23/2018   01/2017 - See on CT imaging of the chest   . Cataract   . Cerebral vascular disease 06/19/2018   06/19/2018 - MRI with chronic microvascular ischemic  change  . Diverticulosis   . Dyspnea    periodically  . Dysrhythmia    a - fib  . Heart murmur    ??? bruit  . History of stress test    a. 04/09/13-nuclear stress-no ischemia low risk  . Hypertension   . Menopause   . Osteoporosis   . PAF (paroxysmal atrial fibrillation) ([place])    a. s/p prior DCCV; b. On flecainide & coumadin (CHA2DS2VASc = 4);  c. 03/2016 Echo: EF 55-60%, mod LVH, mod AI, mild to mod TR, PASP 74mmHg.  Marland Kitchen Pelvic fracture [facility])     Past Surgical History:  Procedure Laterality Date  . BIOPSY BREAST    . BIOPSY BREAST     right & benign  . BREAST SURGERY    . CARDIOVERSION N/A 10/30/2016   Procedure: CARDIOVERSION;  Surgeon: Larey Dresser, MD;  Location: [facility];  Service: Cardiovascular;  Laterality: N/A;  . CARDIOVERSION N/A 08/20/2017   Procedure: CARDIOVERSION;  Surgeon: Jerline Pain, MD;  Location: [facility] ENDOSCOPY;  Service: Cardiovascular;  Laterality: N/A;  . CATARACT EXTRACTION    . EYE SURGERY     cataracts  . TEE WITHOUT CARDIOVERSION N/A 10/30/2016   Procedure: TRANSESOPHAGEAL ECHOCARDIOGRAM (TEE);  Surgeon: Larey Dresser, MD;  Location: [facility];  Service: Cardiovascular;  Laterality: N/A;  . TOTAL HIP ARTHROPLASTY Right 01/27/2017   Procedure: TOTAL HIP ARTHROPLASTY ANTERIOR APPROACH;  Surgeon: Rod Can, MD;  Location: [facility];  Service: Orthopedics;  Laterality: Right;    ROS     Positive for hair loss.  Otherwise as stated in the HPI and negative for all other systems.   PHYSICAL EXAM BP (!) 150/78   Pulse 66   Ht 5' (1.524 m)   Wt 117 lb (53.1 kg)   BMI 22.85 kg/m   GENERAL:  Well appearing NECK:  No jugular venous distention, waveform within normal limits, carotid upstroke brisk and symmetric, no bruits, no thyromegaly LUNGS:  Clear to auscultation bilaterally CHEST:  Unremarkable HEART:  PMI not displaced or sustained,S1 and S2 within normal limits, no S3, no S4, no clicks, no rubs, no murmurs ABD:  Flat, positive bowel  sounds normal in frequency in pitch, no bruits, no rebound, no guarding, no midline pulsatile mass, no hepatomegaly, no splenomegaly EXT:  2 plus pulses throughout, no edema, no cyanosis no clubbing   EKG: Sinus rhythm, rate 66, left axis deviation, poor anterior R wave progression, left ventricle hypertrophy, no acute ST-T wave changes.   Lab Results  Component Value Date   TSH 0.746 11/13/2018   ALT 18 12/09/2018   AST 32 12/09/2018   ALKPHOS 107 12/09/2018   BILITOT  0.2 12/09/2018   PROT 6.8 12/09/2018   ALBUMIN 4.0 12/09/2018     ASSESSMENT AND PLAN  HTN:      Her blood pressure is still slightly elevated.  I extensively reviewed detailed blood pressure recordings that she takes at home and at rehab.  I am can add lisinopril 20 mg every afternoon to her regimen.  She will continue to keep her blood pressure diaries.  Other medications will be as listed.   ATRIAL FIB:   [name] has a CHA2DS2 - VASc score of 4 with a risk of stroke of 4%.  We are going to say that she is intolerant of amiodarone.  Thankfully she is maintaining sinus rhythm.  DYSPNEA:    I reviewed pulmonary records.  No change in therapy.  This is much improved.

## 2018-12-15 ENCOUNTER — Encounter: Payer: Self-pay | Admitting: Cardiology

## 2018-12-15 ENCOUNTER — Encounter (HOSPITAL_COMMUNITY)
Admission: RE | Admit: 2018-12-15 | Discharge: 2018-12-15 | Disposition: A | Discharge status: Home / Self Care | Payer: Medicare Other | Source: Ambulatory Visit | Attending: Pulmonary Disease | Admitting: Pulmonary Disease

## 2018-12-15 DIAGNOSIS — J849 Interstitial pulmonary disease, unspecified: Secondary | ICD-10-CM | POA: Diagnosis not present

## 2018-12-15 NOTE — Progress Notes (Signed)
Daily Session Note  Patient Details  Name: Susan Fitzgerald MRN: [id number] Date of Birth: 10/23/1932 Referring Provider:     PULMONARY REHAB OTHER RESP ORIENTATION from 10/08/2018 in [place]  Referring Provider  Icard      Encounter Date: 12/15/2018  Check In: Session Check In - 12/15/18 1045      Check-In   Supervising physician immediately available to respond to emergencies  See telemetry face sheet for immediately available MD    Location  [facility]    Staff Present  Russella Dar, MS, EP, [facility], Exercise Physiologist;Dai Mcadams Zachery Conch, Exercise Physiologist    Medication changes reported      No    Fall or balance concerns reported     No    Warm-up and Cool-down  Performed as group-led instruction    Resistance Training Performed  Yes    VAD Patient?  No    PAD/SET Patient?  No      Pain Assessment   Currently in Pain?  No/denies    Pain Score  0-No pain    Multiple Pain Sites  No       Capillary Blood Glucose: No results found for this or any previous visit (from the past 24 hour(s)).    Social History   Tobacco Use  Smoking Status Never Smoker  Smokeless Tobacco Never Used    Goals Met:  Proper associated with RPD/PD & O2 Sat Independence with exercise equipment Improved SOB with ADL's Using PLB without cueing & demonstrates good technique Exercise tolerated well No report of cardiac concerns or symptoms Strength training completed today  Goals Unmet:  Not Applicable  Comments: Pt able to follow exercise prescription today without complaint.  Will continue to monitor for progression. Check out 1145.   Dr. Sinda Du is Medical Director for [facility] Re[facility].

## 2018-12-16 ENCOUNTER — Encounter: Payer: Self-pay | Admitting: Cardiology

## 2018-12-16 ENCOUNTER — Ambulatory Visit: Payer: Medicare Other | Admitting: Cardiology

## 2018-12-16 VITALS — BP 150/78 | HR 66 | Ht 60.0 in | Wt 117.0 lb

## 2018-12-16 DIAGNOSIS — I10 Essential (primary) hypertension: Secondary | ICD-10-CM | POA: Diagnosis not present

## 2018-12-16 DIAGNOSIS — I4819 Other persistent atrial fibrillation: Secondary | ICD-10-CM | POA: Diagnosis not present

## 2018-12-16 MED ORDER — LISINOPRIL 20 MG PO TABS: 20.0000 mg | ORAL_TABLET | Freq: Every day | ORAL | 3 refills | Status: DC

## 2018-12-16 NOTE — Patient Instructions (Signed)
Medication Instructions:  Please take Lisinopril 20 mg every evening.  Continue all other medications as listed.  If you need a refill on your cardiac medications before your next appointment, please call your pharmacy.   Follow-Up: Follow up in March with Dr Percival Spanish in [place] as scheduled.  Thank you for choosing [facility]!!

## 2018-12-17 ENCOUNTER — Encounter (HOSPITAL_COMMUNITY)
Admission: RE | Admit: 2018-12-17 | Discharge: 2018-12-17 | Disposition: A | Discharge status: Home / Self Care | Payer: Medicare Other | Source: Ambulatory Visit | Attending: Pulmonary Disease | Admitting: Pulmonary Disease

## 2018-12-17 DIAGNOSIS — J849 Interstitial pulmonary disease, unspecified: Secondary | ICD-10-CM | POA: Diagnosis not present

## 2018-12-17 NOTE — Progress Notes (Signed)
Daily Session Note  Patient Details  Name: Susan Fitzgerald MRN: [id number] Date of Birth: 05-28-1932 Referring Provider:     PULMONARY REHAB OTHER RESP ORIENTATION from 10/08/2018 in [place]  Referring Provider  Icard      Encounter Date: 12/17/2018  Check In: Session Check In - 12/17/18 1045      Check-In   Supervising physician immediately available to respond to emergencies  See telemetry face sheet for immediately available MD    Location  [facility]    Staff Present  Benay Pike, Exercise Physiologist;Debra Wynetta Emery, RN, BSN    Medication changes reported      Yes    Comments  Added another dose of lisinopril 96m at night.     Fall or balance concerns reported     No    Warm-up and Cool-down  Performed as group-led instruction    Resistance Training Performed  Yes    VAD Patient?  No    PAD/SET Patient?  No      Pain Assessment   Currently in Pain?  No/denies    Pain Score  0-No pain    Multiple Pain Sites  No       Capillary Blood Glucose: No results found for this or any previous visit (from the past 24 hour(s)).    Social History   Tobacco Use  Smoking Status Never Smoker  Smokeless Tobacco Never Used    Goals Met:  Proper associated with RPD/PD & O2 Sat Independence with exercise equipment Improved SOB with ADL's Exercise tolerated well No report of cardiac concerns or symptoms Strength training completed today  Goals Unmet:  Not Applicable  Comments: Pt able to follow exercise prescription today without complaint.  Will continue to monitor for progression. Check out 1145.   [facility]. ESinda Duis [facility].

## 2018-12-22 ENCOUNTER — Encounter (HOSPITAL_COMMUNITY)
Admission: RE | Admit: 2018-12-22 | Discharge: 2018-12-22 | Disposition: A | Discharge status: Home / Self Care | Payer: Medicare Other | Source: Ambulatory Visit | Attending: Pulmonary Disease | Admitting: Pulmonary Disease

## 2018-12-22 DIAGNOSIS — J849 Interstitial pulmonary disease, unspecified: Secondary | ICD-10-CM | POA: Insufficient documentation

## 2018-12-22 NOTE — Progress Notes (Signed)
Daily Session Note  Patient Details  Name: Susan Fitzgerald MRN: [id number] Date of Birth: 09-21-1932 Referring Provider:     PULMONARY REHAB OTHER RESP ORIENTATION from 10/08/2018 in [place]  Referring Provider  Icard      Encounter Date: 12/22/2018  Check In: Session Check In - 12/22/18 1045      Check-In   Supervising physician immediately available to respond to emergencies  See telemetry face sheet for immediately available MD    Location  [facility]    Staff Present  Benay Pike, Exercise Physiologist;Diane Coad, MS, EP, [facility], Exercise Physiologist    Medication changes reported      No    Fall or balance concerns reported     No    Warm-up and Cool-down  Performed as group-led instruction    Resistance Training Performed  Yes    VAD Patient?  No    PAD/SET Patient?  No      Pain Assessment   Currently in Pain?  No/denies    Pain Score  0-No pain    Multiple Pain Sites  No       Capillary Blood Glucose: No results found for this or any previous visit (from the past 24 hour(s)).    Social History   Tobacco Use  Smoking Status Never Smoker  Smokeless Tobacco Never Used    Goals Met:  Proper associated with RPD/PD & O2 Sat Independence with exercise equipment Using PLB without cueing & demonstrates good technique Exercise tolerated well No report of cardiac concerns or symptoms Strength training completed today  Goals Unmet:  Not Applicable  Comments: Pt able to follow exercise prescription today without complaint.  Will continue to monitor for progression. Check out 1145.   Dr. Sinda Du is Medical Director for [facility].

## 2018-12-23 ENCOUNTER — Telehealth: Payer: Self-pay | Admitting: Family Medicine

## 2018-12-23 NOTE — Telephone Encounter (Signed)
Patient is confused because she received a call from nephrologist's office stating she has an appointment with them this Friday.  She had seen them 6 months ago.  She recently had labwork and her creatinine was slightly elevated but you recommended we continue to monitor.  She would like your opinion as to whether she should keep the appointment with the nephrologist on Friday.  Please advise.

## 2018-12-23 NOTE — Telephone Encounter (Signed)
This could be an appointment that was arranged by the nephrologist as she has not seen them for 6 months is probably a good idea for her to keep this appointment to stay on top of her renal function

## 2018-12-23 NOTE — Telephone Encounter (Signed)
Aware to keep appt

## 2018-12-24 ENCOUNTER — Encounter (HOSPITAL_COMMUNITY)
Admission: RE | Admit: 2018-12-24 | Discharge: 2018-12-24 | Disposition: A | Discharge status: Home / Self Care | Payer: Medicare Other | Source: Ambulatory Visit | Attending: Pulmonary Disease | Admitting: Pulmonary Disease

## 2018-12-24 DIAGNOSIS — J849 Interstitial pulmonary disease, unspecified: Secondary | ICD-10-CM | POA: Diagnosis not present

## 2018-12-24 NOTE — Progress Notes (Signed)
Daily Session Note  Patient Details  Name: Susan Fitzgerald MRN: [id number] Date of Birth: November 28, 1931 Referring Provider:     PULMONARY REHAB OTHER RESP ORIENTATION from 10/08/2018 in [place]  Referring Provider  Icard      Encounter Date: 12/24/2018  Check In: Session Check In - 12/24/18 1045      Check-In   Supervising physician immediately available to respond to emergencies  See telemetry face sheet for immediately available MD    Location  [facility]    Staff Present  Benay Pike, Exercise Physiologist;Diane Coad, MS, EP, [facility], Exercise Physiologist;Amaru Burroughs Wynetta Emery, RN, BSN    Medication changes reported      No    Fall or balance concerns reported     No    Warm-up and Cool-down  Performed as group-led instruction    Resistance Training Performed  Yes    VAD Patient?  No    PAD/SET Patient?  No      Pain Assessment   Currently in Pain?  No/denies    Pain Score  0-No pain    Multiple Pain Sites  No       Capillary Blood Glucose: No results found for this or any previous visit (from the past 24 hour(s)).    Social History   Tobacco Use  Smoking Status Never Smoker  Smokeless Tobacco Never Used    Goals Met:  Proper associated with RPD/PD & O2 Sat Independence with exercise equipment Improved SOB with ADL's Using PLB without cueing & demonstrates good technique Exercise tolerated well No report of cardiac concerns or symptoms Strength training completed today  Goals Unmet:  Not Applicable  Comments: Pt able to follow exercise prescription today without complaint.  Will continue to monitor for progression. Check out 1145.   Dr. Sinda Du is Medical Director for [facility] Pul[facility] Re[facility].

## 2018-12-24 NOTE — Progress Notes (Signed)
Pulmonary Individual Treatment Plan  Patient Details  Name: Susan Fitzgerald MRN: [id number] Date of Birth: 28-Dec-1931 Referring Provider:     PULMONARY REHAB OTHER RESP ORIENTATION from 10/08/2018 in [facility]  Referring Provider  Icard      Initial Encounter Date:    PULMONARY REHAB OTHER RESP ORIENTATION from 10/08/2018 in [facility]  Date  10/08/18      Visit Diagnosis: ILD (interstitial lung disease) ([place])  Patient's Home Medications on Admission:   Current Outpatient Medications:  .  apixaban (ELIQUIS) 2.5 MG TABS tablet, Take 2.5 mg by mouth 2 (two) times daily. , Disp: , Rfl:  .  calcium-vitamin D (OSCAL WITH D) 250-125 MG-UNIT per tablet, Take 1 tablet by mouth 2 (two) times daily., Disp: , Rfl:  .  diltiazem (DILACOR XR) 180 MG 24 hr capsule, Take 180 mg by mouth daily., Disp: , Rfl:  .  folic acid (FOLVITE) 1 MG tablet, Take 2 mg by mouth daily. , Disp: , Rfl:  .  furosemide (LASIX) 20 MG tablet, Take 1 tablet (20 mg total) by mouth daily., Disp: 180 tablet, Rfl: 1 .  gabapentin (NEURONTIN) 100 MG capsule, Take 100 mg by mouth at bedtime. , Disp: , Rfl:  .  levothyroxine (SYNTHROID, LEVOTHROID) 75 MCG tablet, TAKE 1 TABLET BY MOUTH  DAILY, Disp: 90 tablet, Rfl: 3 .  lisinopril (PRINIVIL,ZESTRIL) 20 MG tablet, Take 1 tablet (20 mg total) by mouth daily., Disp: 90 tablet, Rfl: 3 .  lisinopril-hydrochlorothiazide (PRINZIDE,ZESTORETIC) 20-12.5 MG tablet, Take 1 tablet by mouth daily., Disp: 90 tablet, Rfl: 3 .  Melatonin 5 MG TABS, Take 5 mg by mouth at bedtime., Disp: , Rfl:  .  methotrexate (RHEUMATREX) 2.5 MG tablet, Take 10 mg by mouth once a week. Caution:Chemotherapy. Protect from light., Disp: , Rfl:  .  Multiple Vitamin (MULTIVITAMIN) capsule, Take 1 capsule by mouth daily., Disp: , Rfl:  .  potassium chloride SA (KLOR-CON M20) 20 MEQ tablet, Take 1 tablet (20 mEq total) by mouth daily., Disp: 90 tablet, Rfl: 1  Current  Facility-Administered Medications:  .  cyanocobalamin ((VITAMIN B-12)) injection 1,000 mcg, 1,000 mcg, Intramuscular, Q30 days, Chipper Herb, MD, 1,000 mcg at 12/09/18 1544  Past Medical History: Past Medical History:  Diagnosis Date  . Arthritis   . Bradycardia 2014  . Calcification of bronchial airway 06/23/2018   01/2017 - See on CT imaging of the chest   . Cataract   . Cerebral vascular disease 06/19/2018   06/19/2018 - MRI with chronic microvascular ischemic change  . Diverticulosis   . Dyspnea    periodically  . Dysrhythmia    a - fib  . Heart murmur    ??? bruit  . History of stress test    a. 04/09/13-nuclear stress-no ischemia low risk  . Hypertension   . Menopause   . Osteoporosis   . PAF (paroxysmal atrial fibrillation) ([place])    a. s/p prior DCCV; b. On flecainide & coumadin (CHA2DS2VASc = 4);  c. 03/2016 Echo: EF 55-60%, mod LVH, mod AI, mild to mod TR, PASP 69mmHg.  Marland Kitchen Pelvic fracture ([facility])     Tobacco Use: Social History   Tobacco Use  Smoking Status Never Smoker  Smokeless Tobacco Never Used    Labs: Recent Review Flowsheet Data    Labs for ITP Cardiac and Pulmonary Rehab Latest Ref Rng & Units 09/16/2016 03/24/2017 09/15/2017 12/04/2018 12/09/2018   Cholestrol 100 - 199 mg/dL 149 169 166 -  176   LDLCALC 0 - 99 mg/dL - - - - 98   LDLDIRECT 0 - 99 mg/dL - - - - -   HDL >39 mg/dL 65 77 71 - 63   Trlycerides 0 - 149 mg/dL 73 55 67 - 74   Hemoglobin A1c <7.0 % - - - 5.0 -      Capillary Blood Glucose: No results found for: GLUCAP   Pulmonary Assessment Scores: Pulmonary Assessment Scores    Row Name 10/08/18 1532         ADL UCSD   ADL Phase  Entry     SOB Score total  28     Rest  0     Walk  5     Stairs  3     Bath  2     Dress  2     Shop  2       CAT Score   CAT Score  10       mMRC Score   mMRC Score  2        Pulmonary Function Assessment:   Exercise Target Goals: Exercise Program Goal: Individual exercise prescription set  using results from initial 6 min walk test and THRR while considering  patient's activity barriers and safety.   Exercise Prescription Goal: Initial exercise prescription builds to 30-45 minutes a day of aerobic activity, 2-3 days per week.  Home exercise guidelines will be given to patient during program as part of exercise prescription that the participant will acknowledge.  Activity Barriers & Risk Stratification: Activity Barriers & Cardiac Risk Stratification - 10/08/18 1504      Activity Barriers & Cardiac Risk Stratification   Activity Barriers  Shortness of Breath    Cardiac Risk Stratification  High       6 Minute Walk: 6 Minute Walk    Row Name 10/08/18 1503         6 Minute Walk   Phase  Initial     Distance  1400 feet     Walk Time  6 minutes     # of Rest Breaks  0     MPH  2.65     METS  3.03     RPE  11     Perceived Dyspnea   9     VO2 Peak  9.04     Symptoms  No     Resting HR  74 bpm     Resting BP  160/60     Resting Oxygen Saturation   96 %     Exercise Oxygen Saturation  during 6 min walk  91 %     Max Ex. HR  102 bpm     Max Ex. BP  148/70     2 Minute Post BP  124/66        Oxygen Initial Assessment: Oxygen Initial Assessment - 10/08/18 1520      Home Oxygen   Sleep Oxygen Prescription  None    Home Exercise Oxygen Prescription  --   Oxygen is prescribed for as needed at 2 L/M. Patient says she is not using at this time.    Home at Rest Exercise Oxygen Prescription  --   Oxygen is prescribed for as needed at 2 L/M. Patient says she is not using at this time.    Compliance with Home Oxygen Use  Yes      Initial 6 min Walk   Oxygen Used  None  Program Oxygen Prescription   Program Oxygen Prescription  --   As needed at 2 L/M.     Intervention   Short Term Goals  To learn and understand importance of monitoring SPO2 with pulse oximeter and demonstrate accurate use of the pulse oximeter.;To learn and understand importance of  maintaining oxygen saturations>88%;To learn and demonstrate proper pursed lip breathing techniques or other breathing techniques.;To learn and demonstrate proper use of respiratory medications;To learn and exhibit compliance with exercise, home and travel O2 prescription    Long  Term Goals  Exhibits compliance with exercise, home and travel O2 prescription;Verbalizes importance of monitoring SPO2 with pulse oximeter and return demonstration;Maintenance of O2 saturations>88%;Exhibits proper breathing techniques, such as pursed lip breathing or other method taught during program session       Oxygen Re-Evaluation: Oxygen Re-Evaluation    Row Name 11/06/18 1306 11/26/18 1555 12/24/18 1318         Program Oxygen Prescription   Program Oxygen Prescription  None  None  None       Home Oxygen   Home Oxygen Device  None  None  None     Sleep Oxygen Prescription  None  None  None     Home Exercise Oxygen Prescription  None  None  None     Home at Rest Exercise Oxygen Prescription  None  None  None     Compliance with Home Oxygen Use  -  -  Yes       Goals/Expected Outcomes   Short Term Goals  To learn and understand importance of monitoring SPO2 with pulse oximeter and demonstrate accurate use of the pulse oximeter.;To learn and understand importance of maintaining oxygen saturations>88%;To learn and demonstrate proper pursed lip breathing techniques or other breathing techniques.;To learn and demonstrate proper use of respiratory medications;To learn and exhibit compliance with exercise, home and travel O2 prescription  To learn and understand importance of monitoring SPO2 with pulse oximeter and demonstrate accurate use of the pulse oximeter.;To learn and understand importance of maintaining oxygen saturations>88%;To learn and demonstrate proper pursed lip breathing techniques or other breathing techniques.;To learn and demonstrate proper use of respiratory medications;To learn and exhibit  compliance with exercise, home and travel O2 prescription  To learn and understand importance of monitoring SPO2 with pulse oximeter and demonstrate accurate use of the pulse oximeter.;To learn and understand importance of maintaining oxygen saturations>88%;To learn and demonstrate proper pursed lip breathing techniques or other breathing techniques.;To learn and demonstrate proper use of respiratory medications;To learn and exhibit compliance with exercise, home and travel O2 prescription     Long  Term Goals  Exhibits compliance with exercise, home and travel O2 prescription;Verbalizes importance of monitoring SPO2 with pulse oximeter and return demonstration;Maintenance of O2 saturations>88%;Exhibits proper breathing techniques, such as pursed lip breathing or other method taught during program session  Exhibits compliance with exercise, home and travel O2 prescription;Verbalizes importance of monitoring SPO2 with pulse oximeter and return demonstration;Maintenance of O2 saturations>88%;Exhibits proper breathing techniques, such as pursed lip breathing or other method taught during program session  Exhibits compliance with exercise, home and travel O2 prescription;Verbalizes importance of monitoring SPO2 with pulse oximeter and return demonstration;Maintenance of O2 saturations>88%;Exhibits proper breathing techniques, such as pursed lip breathing or other method taught during program session     Comments  Patient exhibitis proper demonstration of using pulse oximeter and is able to verbalize the importance of maintaining her O2 sat >88%. She is able to demonstrate proper pursed lip  breathing.   Patient exhibitis proper demonstration of using pulse oximeter and is able to verbalize the importance of maintaining her O2 sat >88%. She is able to demonstrate proper pursed lip breathing.   Patient exhibitis proper demonstration of using pulse oximeter and is able to verbalize the importance of maintaining her O2  sat >88%. She is able to demonstrate proper pursed lip breathing.      Goals/Expected Outcomes  Patient will continue to meet her short and long term goals.   Patient will continue to meet her short and long term goals.   Patient will continue to meet her short and long term goals.         Oxygen Discharge (Final Oxygen Re-Evaluation): Oxygen Re-Evaluation - 12/24/18 1318      Program Oxygen Prescription   Program Oxygen Prescription  None      Home Oxygen   Home Oxygen Device  None    Sleep Oxygen Prescription  None    Home Exercise Oxygen Prescription  None    Home at Rest Exercise Oxygen Prescription  None    Compliance with Home Oxygen Use  Yes      Goals/Expected Outcomes   Short Term Goals  To learn and understand importance of monitoring SPO2 with pulse oximeter and demonstrate accurate use of the pulse oximeter.;To learn and understand importance of maintaining oxygen saturations>88%;To learn and demonstrate proper pursed lip breathing techniques or other breathing techniques.;To learn and demonstrate proper use of respiratory medications;To learn and exhibit compliance with exercise, home and travel O2 prescription    Long  Term Goals  Exhibits compliance with exercise, home and travel O2 prescription;Verbalizes importance of monitoring SPO2 with pulse oximeter and return demonstration;Maintenance of O2 saturations>88%;Exhibits proper breathing techniques, such as pursed lip breathing or other method taught during program session    Comments  Patient exhibitis proper demonstration of using pulse oximeter and is able to verbalize the importance of maintaining her O2 sat >88%. She is able to demonstrate proper pursed lip breathing.     Goals/Expected Outcomes  Patient will continue to meet her short and long term goals.        Initial Exercise Prescription: Initial Exercise Prescription - 10/08/18 1500      Date of Initial Exercise RX and Referring Provider   Date  10/08/18     Referring Provider  Icard    Expected Discharge Date  01/08/19      Treadmill   MPH  1.2    Grade  0    Minutes  17    METs  1.91      NuStep   Level  1    SPM  70    Minutes  17    METs  2      Prescription Details   Frequency (times per week)  2    Duration  Progress to 30 minutes of continuous aerobic without signs/symptoms of physical distress      Intensity   THRR 40-80% of Max Heartrate  [id number]    Ratings of Perceived Exertion  11-13    Perceived Dyspnea  0-4      Progression   Progression  Continue to progress workloads to maintain intensity without signs/symptoms of physical distress.      Resistance Training   Training Prescription  Yes    Weight  1    Reps  10-15       Perform Capillary Blood Glucose checks as needed.  Exercise Prescription Changes:  Exercise Prescription Changes    Row Name 10/08/18 1500 10/22/18 0800 11/05/18 1500 11/19/18 1200 12/02/18 1400     Response to Exercise   Blood Pressure (Admit)  -  144/72  184/70  160/60  180/70   Blood Pressure (Exercise)  -  164/64  190/70  180/70  190/70   Blood Pressure (Exit)  -  138/68  150/70  138/60  154/60   Heart Rate (Admit)  -  72 bpm  71 bpm  77 bpm  69 bpm   Heart Rate (Exercise)  -  80 bpm  95 bpm  91 bpm  93 bpm   Heart Rate (Exit)  -  73 bpm  84 bpm  83 bpm  74 bpm   Oxygen Saturation (Admit)  -  95 %  96 %  95 %  96 %   Oxygen Saturation (Exercise)  -  94 %  93 %  92 %  92 %   Oxygen Saturation (Exit)  -  96 %  97 %  96 %  95 %   Rating of Perceived Exertion (Exercise)  -  11  11  13  12    Perceived Dyspnea (Exercise)  -  11  13  13  13    Comments  -  First two weeks of exercise sessions   increase in overall workload  -  -   Duration  -  Progress to 30 minutes of  aerobic without signs/symptoms of physical distress  Progress to 30 minutes of  aerobic without signs/symptoms of physical distress  Continue with 30 min of aerobic exercise without signs/symptoms of physical distress.   Continue with 30 min of aerobic exercise without signs/symptoms of physical distress.   Intensity  -  THRR New [id number]  THRR unchanged  THRR unchanged  THRR unchanged     Progression   Progression  -  Continue to progress workloads to maintain intensity without signs/symptoms of physical distress.  Continue to progress workloads to maintain intensity without signs/symptoms of physical distress.  Continue to progress workloads to maintain intensity without signs/symptoms of physical distress.  Continue to progress workloads to maintain intensity without signs/symptoms of physical distress.   Average METs  -  1.76  2.14  2.24  2.3     Resistance Training   Training Prescription  -  Yes  Yes  Yes  Yes   Weight  -  1  2  2  3    Reps  -  10-15  10-15  10-15  10-15     Treadmill   MPH  -  1.2  1.8  2.2  2.5   Grade  -  0  0  0  0   Minutes  -  17  17  17  17    METs  -  1.92  2.37  2.68  2.9     NuStep   Level  -  1  2  2  2    SPM  -  70  118  122  129   Minutes  -  22  22  22  22    METs  -  1.6  1.9  1.8  1.7     Home Exercise Plan   Plans to continue exercise at  Home (comment)  Home (comment)  Home (comment)  Home (comment)  Home (comment)   Frequency  Add 3 additional days to program exercise sessions.  Add 3 additional days to program exercise sessions.  Add 3 additional days to program exercise sessions.  Add 3 additional days to program exercise sessions.  Add 3 additional days to program exercise sessions.   Initial Home Exercises Provided  10/08/18  10/08/18  10/08/18  10/08/18  10/08/18   Row Name 12/17/18 0700             Response to Exercise   Blood Pressure (Admit)  144/76       Blood Pressure (Exercise)  164/70       Blood Pressure (Exit)  134/74       Heart Rate (Admit)  69 bpm       Heart Rate (Exercise)  94 bpm       Heart Rate (Exit)  85 bpm       Oxygen Saturation (Admit)  97 %       Oxygen Saturation (Exercise)  93 %       Oxygen Saturation (Exit)  94 %        Rating of Perceived Exertion (Exercise)  13       Perceived Dyspnea (Exercise)  13       Comments  increase in overall workload       Duration  Continue with 30 min of aerobic exercise without signs/symptoms of physical distress.       Intensity  THRR unchanged         Progression   Progression  Continue to progress workloads to maintain intensity without signs/symptoms of physical distress.       Average METs  2.34         Resistance Training   Training Prescription  Yes       Weight  3       Reps  10-15         Treadmill   MPH  2.5       Grade  0       Minutes  17       METs  2.91         NuStep   Level  2       SPM  154       Minutes  22       METs  1.7         Home Exercise Plan   Plans to continue exercise at  Home (comment)       Frequency  Add 3 additional days to program exercise sessions.       Initial Home Exercises Provided  10/08/18          Exercise Comments:  Exercise Comments    Row Name 11/05/18 1522 11/26/18 0749 12/22/18 1026       Exercise Comments  Pt. has done extremely well in the program. She has tolerated exercise with ease and is always eager for progressions and increases. We will continue to monitor her progress and Progress as needed.   Pt. has done great upon returning after a 2 week break for the holidays. She is up to 2.2MPH on the Treadmill and level 2 on the NuStep. She is working hard towards her goal of being able to play an entire round of golf without SOB.   Pt. is doing excellent in PR. She has attended 17 sessions. She is up to 2.7 MPH o nthe TM and level 3 on the NuStep. She states feeling stronger since starting and is ready to get back on the golf course. She has been hitting golf balls at her house  and says its going well.         Exercise Goals and Review:  Exercise Goals    Row Name 10/08/18 1506             Exercise Goals   Increase Physical Activity  Yes       Intervention  Provide advice, education, support and  counseling about physical activity/exercise needs.;Develop an individualized exercise prescription for aerobic and resistive training based on initial evaluation findings, risk stratification, comorbidities and participant's personal goals.       Expected Outcomes  Short Term: Attend rehab on a regular basis to increase amount of physical activity.       Increase Strength and Stamina  Yes       Intervention  Provide advice, education, support and counseling about physical activity/exercise needs.;Develop an individualized exercise prescription for aerobic and resistive training based on initial evaluation findings, risk stratification, comorbidities and participant's personal goals.       Expected Outcomes  Short Term: Increase workloads from initial exercise prescription for resistance, speed, and METs.       Able to understand and use rate of perceived exertion (RPE) scale  Yes       Intervention  Provide education and explanation on how to use RPE scale       Expected Outcomes  Short Term: Able to use RPE daily in rehab to express subjective intensity level;Long Term:  Able to use RPE to guide intensity level when exercising independently       Able to understand and use Dyspnea scale  Yes       Intervention  Provide education and explanation on how to use Dyspnea scale       Expected Outcomes  Long Term: Able to use Dyspnea scale to guide intensity level when exercising independently;Short Term: Able to use Dyspnea scale daily in rehab to express subjective sense of shortness of breath during exertion       Knowledge and understanding of Target Heart Rate Range (THRR)  Yes       Intervention  Provide education and explanation of THRR including how the numbers were predicted and where they are located for reference       Expected Outcomes  Short Term: Able to state/look up THRR;Long Term: Able to use THRR to govern intensity when exercising independently;Short Term: Able to use daily as guideline for  intensity in rehab       Able to check pulse independently  Yes       Intervention  Provide education and demonstration on how to check pulse in carotid and radial arteries.;Review the importance of being able to check your own pulse for safety during independent exercise       Expected Outcomes  Short Term: Able to explain why pulse checking is important during independent exercise;Long Term: Able to check pulse independently and accurately       Understanding of Exercise Prescription  Yes       Intervention  Provide education, explanation, and written materials on patient's individual exercise prescription       Expected Outcomes  Short Term: Able to explain program exercise prescription;Long Term: Able to explain home exercise prescription to exercise independently          Exercise Goals Re-Evaluation : Exercise Goals Re-Evaluation    Row Name 11/05/18 1520 11/26/18 0748 12/22/18 1025         Exercise Goal Re-Evaluation   Exercise Goals Review  Increase Physical Activity;Increase  Strength and Stamina;Able to understand and use rate of perceived exertion (RPE) scale;Able to understand and use Dyspnea scale;Knowledge and understanding of Target Heart Rate Range (THRR);Able to check pulse independently;Understanding of Exercise Prescription  Increase Physical Activity;Increase Strength and Stamina;Able to understand and use rate of perceived exertion (RPE) scale;Able to understand and use Dyspnea scale;Knowledge and understanding of Target Heart Rate Range (THRR);Able to check pulse independently;Understanding of Exercise Prescription  Increase Physical Activity;Increase Strength and Stamina;Able to understand and use rate of perceived exertion (RPE) scale;Able to understand and use Dyspnea scale;Knowledge and understanding of Target Heart Rate Range (THRR);Able to check pulse independently;Understanding of Exercise Prescription     Comments  Pt. has done well in the program, she has attended 8  sessions so far. She is very eager to come every day and work to increase strength and stamina.   Pt. continues to do great in the program. She was out for 2 weeks due to the holiday season but returned just as spunky and ready to work as ever. She is eager to increase her stength and stamina. We will continue to progress her workloads as tolerated.   Pt. is doing great in the program. She works hard everyday and is very deligent about increasing her distance on the NuStep, she is slightly obsessed with how many steps per minute she pumps. We will continue to monitor and progress.      Expected Outcomes  increase strength, imrpove ability to perform ADL's without SOB   increase strength, imrpove ability to perform ADL's without SOB   increase strength, imrpove ability to perform ADL's without SOB         Discharge Exercise Prescription (Final Exercise Prescription Changes): Exercise Prescription Changes - 12/17/18 0700      Response to Exercise   Blood Pressure (Admit)  144/76    Blood Pressure (Exercise)  164/70    Blood Pressure (Exit)  134/74    Heart Rate (Admit)  69 bpm    Heart Rate (Exercise)  94 bpm    Heart Rate (Exit)  85 bpm    Oxygen Saturation (Admit)  97 %    Oxygen Saturation (Exercise)  93 %    Oxygen Saturation (Exit)  94 %    Rating of Perceived Exertion (Exercise)  13    Perceived Dyspnea (Exercise)  13    Comments  increase in overall workload    Duration  Continue with 30 min of aerobic exercise without signs/symptoms of physical distress.    Intensity  THRR unchanged      Progression   Progression  Continue to progress workloads to maintain intensity without signs/symptoms of physical distress.    Average METs  2.34      Resistance Training   Training Prescription  Yes    Weight  3    Reps  10-15      Treadmill   MPH  2.5    Grade  0    Minutes  17    METs  2.91      NuStep   Level  2    SPM  154    Minutes  22    METs  1.7      Home Exercise Plan    Plans to continue exercise at  Home (comment)    Frequency  Add 3 additional days to program exercise sessions.    Initial Home Exercises Provided  10/08/18       Nutrition:  Target Goals: Understanding of  nutrition guidelines, daily intake of sodium 1500mg , cholesterol 200mg , calories 30% from fat and 7% or less from saturated fats, daily to have 5 or more servings of fruits and vegetables.  Biometrics: Pre Biometrics - 10/08/18 1506      Pre Biometrics   Height  5\' 1"  (1.549 m)    Weight  53.8 kg    Waist Circumference  28 inches    Hip Circumference  34.5 inches    Waist to Hip Ratio  0.81 %    BMI (Calculated)  22.42    Triceps Skinfold  14 mm    % Body Fat  31.7 %    Grip Strength  17.03 kg    Flexibility  0 in    Single Leg Stand  1.5 seconds        Nutrition Therapy Plan and Nutrition Goals: Nutrition Therapy & Goals - 12/24/18 1318      Nutrition Therapy   RD appointment deferred  Yes      Personal Nutrition Goals   Comments  Patient did not attend the RD meeting in February. She continues to say she has not changed her diet since she started the program. Will continue to monitor for progress.       Intervention Plan   Intervention  Nutrition handout(s) given to patient.       Nutrition Assessments: Nutrition Assessments - 10/08/18 1537      MEDFICTS Scores   Pre Score  32       Nutrition Goals Re-Evaluation:   Nutrition Goals Discharge (Final Nutrition Goals Re-Evaluation):   Psychosocial: Target Goals: Acknowledge presence or absence of significant depression and/or stress, maximize coping skills, provide positive support system. Participant is able to verbalize types and ability to use techniques and skills needed for reducing stress and depression.  Initial Review & Psychosocial Screening: Initial Psych Review & Screening - 10/08/18 [place]?  Yes      Barriers   Psychosocial barriers to participate  in program  There are no identifiable barriers or psychosocial needs.      Screening Interventions   Interventions  Encouraged to exercise    Expected Outcomes  Long Term goal: The participant improves quality of Life and PHQ9 Scores as seen by post scores and/or verbalization of changes       Quality of Life Scores: Quality of Life - 10/08/18 1507      Quality of Life   Select  Quality of Life      Quality of Life Scores   Health/Function Pre  20.07 %    Socioeconomic Pre  28.75 %    Psych/Spiritual Pre  24 %    Family Pre  27 %    GLOBAL Pre  23.17 %      Scores of 19 and below usually indicate a poorer quality of life in these areas.  A difference of  2-3 points is a clinically meaningful difference.  A difference of 2-3 points in the total score of the Quality of Life Index has been associated with significant improvement in overall quality of life, self-image, physical symptoms, and general health in studies assessing change in quality of life.   PHQ-9: Recent Review Flowsheet Data    Depression screen [facility] 2/9 12/09/2018 10/28/2018 10/08/2018 09/03/2018 08/25/2018   Decreased Interest 1 0 0 0 0   Down, Depressed, Hopeless 0 0 0 0 0   PHQ - 2 Score  1 0 0 0 0   Altered sleeping - - 2 - -   Tired, decreased energy - - 1 - -   Change in appetite - - 1 - -   Feeling bad or failure about yourself  - - 0 - -   Trouble concentrating - - 0 - -   Moving slowly or fidgety/restless - - 0 - -   Suicidal thoughts - - 0 - -   PHQ-9 Score - - 4 - -   Difficult doing work/chores - - Somewhat difficult - -     Interpretation of Total Score  Total Score Depression Severity:  1-4 = Minimal depression, 5-9 = Mild depression, 10-14 = Moderate depression, 15-19 = Moderately severe depression, 20-27 = Severe depression   Psychosocial Evaluation and Intervention: Psychosocial Evaluation - 10/08/18 1536      Psychosocial Evaluation & Interventions   Interventions  Encouraged to exercise  with the program and follow exercise prescription;Relaxation education;Stress management education    Comments  Patient has no psychosical barriers identified at orientation. Her initial QOL score was 23.17 and her PHQ-9 score was 4.    Expected Outcomes  Patient will have no psychosocial barriers identified at [place].     Continue Psychosocial Services   No Follow up required       Psychosocial Re-Evaluation: Psychosocial Re-Evaluation    [place] Name 11/06/18 1311 11/26/18 1558 12/24/18 1323         Psychosocial Re-Evaluation   Current issues with  None Identified  None Identified  None Identified     Comments  Patient's initial QOL score was 23.17 and  her PHQ-9 score was 4 with no psychosocial issues identified.   Patient's initial QOL score was 23.17 and  her PHQ-9 score was 4 with no psychosocial issues identified.   Patient's initial QOL score was 23.17 and  her PHQ-9 score was 4 with no psychosocial issues identified.      Expected Outcomes  Patient will have no psychosocial issues identified at discharge.   Patient will have no psychosocial issues identified at discharge.   Patient will have no psychosocial issues identified at discharge.      Interventions  Relaxation education;Stress management education;Encouraged to attend Pulmonary Rehabilitation for the exercise  Relaxation education;Stress management education;Encouraged to attend Pulmonary Rehabilitation for the exercise  Relaxation education;Stress management education;Encouraged to attend Pulmonary Rehabilitation for the exercise     Continue Psychosocial Services   No Follow up required  No Follow up required  No Follow up required        Psychosocial Discharge (Final Psychosocial Re-Evaluation): Psychosocial Re-Evaluation - 12/24/18 1323      Psychosocial Re-Evaluation   Current issues with  None Identified    Comments  Patient's initial QOL score was 23.17 and  her PHQ-9 score was 4 with no psychosocial issues  identified.     Expected Outcomes  Patient will have no psychosocial issues identified at discharge.     Interventions  Relaxation education;Stress management education;Encouraged to attend Pulmonary Rehabilitation for the exercise    Continue Psychosocial Services   No Follow up required        Education: Education Goals: Education classes will be provided on a weekly basis, covering required topics. Participant will state understanding/return demonstration of topics presented.  Learning Barriers/Preferences: Learning Barriers/Preferences - 10/08/18 1537      Learning Barriers/Preferences   Learning Barriers  None    Learning Preferences  Written Material  Education Topics: How Lungs Work and Diseases: - Discuss the anatomy of the lungs and diseases that can affect the lungs, such as COPD.   PULMONARY REHAB OTHER RESPIRATORY from 12/24/2018 in [place]  Date  12/24/18  Educator  Wynetta Emery  Instruction Review Code  2- Demonstrated Understanding      Exercise: -Discuss the importance of exercise, FITT principles of exercise, normal and abnormal responses to exercise, and how to exercise safely.   Environmental Irritants: -Discuss types of environmental irritants and how to limit exposure to environmental irritants.   Meds/Inhalers and oxygen: - Discuss respiratory medications, definition of an inhaler and oxygen, and the proper way to use an inhaler and oxygen.   Energy Saving Techniques: - Discuss methods to conserve energy and decrease shortness of breath when performing activities of daily living.    Bronchial Hygiene / Breathing Techniques: - Discuss breathing mechanics, pursed-lip breathing technique,  proper posture, effective ways to clear airways, and other functional breathing techniques   PULMONARY REHAB OTHER RESPIRATORY from 12/24/2018 in [place]  Date  10/22/18  Educator  Wynetta Emery  Instruction Review Code   2- Demonstrated Understanding      Cleaning Equipment: - Provides group verbal and written instruction about the health risks of elevated stress, cause of high stress, and healthy ways to reduce stress.   PULMONARY REHAB OTHER RESPIRATORY from 12/24/2018 in [place]  Date  10/29/18  Educator  Wynetta Emery  Instruction Review Code  2- Demonstrated Understanding      Nutrition I: Fats: - Discuss the types of cholesterol, what cholesterol does to the body, and how cholesterol levels can be controlled.   PULMONARY REHAB OTHER RESPIRATORY from 12/24/2018 in [place]  Date  11/05/18  Educator  Wynetta Emery  Instruction Review Code  2- Demonstrated Understanding      Nutrition II: Labels: -Discuss the different components of food labels and how to read food labels.   PULMONARY REHAB OTHER RESPIRATORY from 12/24/2018 in [place]  Date  11/19/18  Educator  Wynetta Emery  Instruction Review Code  2- Demonstrated Understanding      Respiratory Infections: - Discuss the signs and symptoms of respiratory infections, ways to prevent respiratory infections, and the importance of seeking medical treatment when having a respiratory infection.   PULMONARY REHAB OTHER RESPIRATORY from 12/24/2018 in [place]  Date  11/26/18  Educator  Wynetta Emery  Instruction Review Code  2- Demonstrated Understanding      Stress I: Signs and Symptoms: - Discuss the causes of stress, how stress may lead to anxiety and depression, and ways to limit stress.   PULMONARY REHAB OTHER RESPIRATORY from 12/24/2018 in [place]  Date  12/03/18  Educator  MB  Instruction Review Code  2- Demonstrated Understanding      Stress II: Relaxation: -Discuss relaxation techniques to limit stress.   PULMONARY REHAB OTHER RESPIRATORY from 12/24/2018 in [place]  Date  12/10/18  Educator  Wynetta Emery   Instruction Review Code  2- Demonstrated Understanding      Oxygen for Home/Travel: - Discuss how to prepare for travel when on oxygen and proper ways to transport and store oxygen to ensure safety.   PULMONARY REHAB OTHER RESPIRATORY from 12/24/2018 in [place]  Date  12/17/18  Educator  DJ  Instruction Review Code  2- Demonstrated Understanding      Knowledge Questionnaire Score: Knowledge Questionnaire Score -  10/08/18 1538      Knowledge Questionnaire Score   Pre Score  14/18       Core Components/Risk Factors/Patient Goals at Admission: Personal Goals and Risk Factors at Admission - 10/08/18 1538      Core Components/Risk Factors/Patient Goals on Admission    Weight Management  Weight Maintenance    Improve shortness of breath with ADL's  Yes    Intervention  Provide education, individualized exercise plan and daily activity instruction to help decrease symptoms of SOB with activities of daily living.    Expected Outcomes  Short Term: Improve cardiorespiratory fitness to achieve a reduction of symptoms when performing ADLs;Long Term: Be able to perform more ADLs without symptoms or delay the onset of symptoms    Hypertension  Yes    Intervention  Provide education on lifestyle modifcations including regular physical activity/exercise, weight management, moderate sodium restriction and increased consumption of fresh fruit, vegetables, and low fat dairy, alcohol moderation, and smoking cessation.;Monitor prescription use compliance.    Expected Outcomes  Short Term: Continued assessment and intervention until BP is < 140/69mm HG in hypertensive participants. < 130/1mm HG in hypertensive participants with diabetes, heart failure or chronic kidney disease.;Long Term: Maintenance of blood pressure at goal levels.    Personal Goal Other  Yes    Personal Goal  Breathe better. Has less SOB with activities. Gain strength and stamina. Get back to playing golf.      Intervention  Patient will attend PR 2 days/week and supplement with exercise 3 days/week.     Expected Outcomes  Patient will complete the program and meet her personal goals.        Core Components/Risk Factors/Patient Goals Review:  Goals and Risk Factor Review    Row Name 11/06/18 1309 11/26/18 1556 12/24/18 1320         Core Components/Risk Factors/Patient Goals Review   Personal Goals Review  Weight Management/Obesity;Hypertension;Improve shortness of breath with ADL's Breathe better; gain strength; do activities w/o SOB; golf again.   Weight Management/Obesity;Hypertension;Improve shortness of breath with ADL's Breathe better; gain strength; do activities w/o SOB; golf again.   Weight Management/Obesity;Hypertension;Improve shortness of breath with ADL's Breathe better; gain strength; do activities w/o SOB; golf again.      Review  Patient has completed 8 sessions losing 1.5 lbs since her initial visit. She is doing well in the program with progression. She says she does feel stronger and has more energy. She feels like she will be ready to golf in the Spring if she continues the program. Her breathing has improved. Will continue to monitor for progress.   Patient has completed 11 sessions maintaining her weight since last 30 day review. She continues to do well in the program with progression. She says her energy continues to improve and she is breathing better. She feels like she will be ready to golf soon. Will continue to monitor for progress.   Patient has completed 19 session gaining 1 lb since last 30 day review. Patient states the program has given me a good emotional aura with improved stamina and breathing. She feels the program and really benefited her. She is ready to play golf in the Spring. She also states she has learned a lot in the education sessions. Will continue to monitor for progress.      Expected Outcomes  Patient will continue to attend sessions and complete the  program meeting her personal goals.   Patient will continue to attend sessions and  complete the program meeting her personal goals.   Patient will continue to attend sessions and complete the program meeting her personal goals.         Core Components/Risk Factors/Patient Goals at Discharge (Final Review):  Goals and Risk Factor Review - 12/24/18 1320      Core Components/Risk Factors/Patient Goals Review   Personal Goals Review  Weight Management/Obesity;Hypertension;Improve shortness of breath with ADL's   Breathe better; gain strength; do activities w/o SOB; golf again.    Review  Patient has completed 19 session gaining 1 lb since last 30 day review. Patient states the program has given me a good emotional aura with improved stamina and breathing. She feels the program and really benefited her. She is ready to play golf in the Spring. She also states she has learned a lot in the education sessions. Will continue to monitor for progress.     Expected Outcomes  Patient will continue to attend sessions and complete the program meeting her personal goals.        ITP Comments: ITP Comments    Row Name 10/19/18 1450           ITP Comments  Patient new to program. She has completed 2 sessions. Will continue to monitor for progress.           Comments: ITP REVIEW Pt is making expected progress toward pulmonary rehab goals after completing 19 sessions. Recommend continued exercise, life style modification, education, and utilization of breathing techniques to increase stamina and strength and decrease shortness of breath with exertion.

## 2018-12-25 DIAGNOSIS — I129 Hypertensive chronic kidney disease with stage 1 through stage 4 chronic kidney disease, or unspecified chronic kidney disease: Secondary | ICD-10-CM | POA: Diagnosis not present

## 2018-12-25 DIAGNOSIS — N179 Acute kidney failure, unspecified: Secondary | ICD-10-CM | POA: Diagnosis not present

## 2018-12-25 DIAGNOSIS — N183 Chronic kidney disease, stage 3 (moderate): Secondary | ICD-10-CM | POA: Diagnosis not present

## 2018-12-29 ENCOUNTER — Encounter (HOSPITAL_COMMUNITY)
Admission: RE | Admit: 2018-12-29 | Discharge: 2018-12-29 | Disposition: A | Discharge status: Home / Self Care | Payer: Medicare Other | Source: Ambulatory Visit | Attending: Pulmonary Disease | Admitting: Pulmonary Disease

## 2018-12-29 DIAGNOSIS — J849 Interstitial pulmonary disease, unspecified: Secondary | ICD-10-CM | POA: Diagnosis not present

## 2018-12-29 NOTE — Progress Notes (Signed)
Daily Session Note  Patient Details  Name: HATTYE SIEGFRIED MRN: [id number] Date of Birth: 01/26/1932 Referring Provider:     PULMONARY REHAB OTHER RESP ORIENTATION from 10/08/2018 in [place]  Referring Provider  Icard      Encounter Date: 12/29/2018  Check In: Session Check In - 12/29/18 1045      Check-In   Supervising physician immediately available to respond to emergencies  See telemetry face sheet for immediately available MD    Location  [facility]    Staff Present  Benay Pike, Exercise Physiologist;Diane Coad, MS, EP, [facility], Exercise Physiologist    Medication changes reported      No    Fall or balance concerns reported     No    Warm-up and Cool-down  Performed as group-led instruction    Resistance Training Performed  Yes    VAD Patient?  No    PAD/SET Patient?  No      Pain Assessment   Currently in Pain?  No/denies    Pain Score  0-No pain    Multiple Pain Sites  No       Capillary Blood Glucose: No results found for this or any previous visit (from the past 24 hour(s)).    Social History   Tobacco Use  Smoking Status Never Smoker  Smokeless Tobacco Never Used    Goals Met:  Proper associated with RPD/PD & O2 Sat Independence with exercise equipment Using PLB without cueing & demonstrates good technique Exercise tolerated well No report of cardiac concerns or symptoms Strength training completed today  Goals Unmet:  Not Applicable  Comments: Pt able to follow exercise prescription today without complaint.  Will continue to monitor for progression. Check out 1145.   Dr. Sinda Du is Medical Director for [facility] Pulmonary Rehab.

## 2018-12-31 ENCOUNTER — Encounter (HOSPITAL_COMMUNITY)
Admission: RE | Admit: 2018-12-31 | Discharge: 2018-12-31 | Disposition: A | Discharge status: Home / Self Care | Payer: Medicare Other | Source: Ambulatory Visit | Attending: Pulmonary Disease | Admitting: Pulmonary Disease

## 2018-12-31 DIAGNOSIS — J849 Interstitial pulmonary disease, unspecified: Secondary | ICD-10-CM

## 2018-12-31 NOTE — Progress Notes (Signed)
Daily Session Note  Patient Details  Name: SARELY STRACENER MRN: [id number] Date of Birth: April 20, 1932 Referring Provider:     PULMONARY REHAB OTHER RESP ORIENTATION from 10/08/2018 in [place]  Referring Provider  Icard      Encounter Date: 12/31/2018  Check In: Session Check In - 12/31/18 1045      Check-In   Supervising physician immediately available to respond to emergencies  See telemetry face sheet for immediately available MD    Location  [facility]    Staff Present  Benay Pike, Exercise Physiologist;Shital Crayton Wynetta Emery, RN, BSN    Medication changes reported      No    Fall or balance concerns reported     No    Warm-up and Cool-down  Performed as group-led instruction    Resistance Training Performed  Yes    VAD Patient?  No    PAD/SET Patient?  No      Pain Assessment   Currently in Pain?  No/denies    Pain Score  0-No pain    Multiple Pain Sites  No       Capillary Blood Glucose: No results found for this or any previous visit (from the past 24 hour(s)).    Social History   Tobacco Use  Smoking Status Never Smoker  Smokeless Tobacco Never Used    Goals Met:  Proper associated with RPD/PD & O2 Sat Independence with exercise equipment Improved SOB with ADL's Using PLB without cueing & demonstrates good technique Exercise tolerated well No report of cardiac concerns or symptoms Strength training completed today  Goals Unmet:  Not Applicable  Comments: Pt able to follow exercise prescription today without complaint.  Will continue to monitor for progression. Check out 1145.   Dr. Sinda Du is Medical Director for [facility] Pulmonary Rehab.

## 2019-01-05 ENCOUNTER — Encounter (HOSPITAL_COMMUNITY)
Admission: RE | Admit: 2019-01-05 | Discharge: 2019-01-05 | Disposition: A | Discharge status: Home / Self Care | Payer: Medicare Other | Source: Ambulatory Visit | Attending: Pulmonary Disease | Admitting: Pulmonary Disease

## 2019-01-05 DIAGNOSIS — J849 Interstitial pulmonary disease, unspecified: Secondary | ICD-10-CM | POA: Diagnosis not present

## 2019-01-05 NOTE — Progress Notes (Signed)
Daily Session Note  Patient Details  Name: Susan Fitzgerald MRN: [id number] Date of Birth: [birth year] Referring Provider:     PULMONARY REHAB OTHER RESP ORIENTATION from 10/08/2018 in [place]  Referring Provider  Icard      Encounter Date: 01/05/2019  Check In: Session Check In - 01/05/19 1045      Check-In   Supervising physician immediately available to respond to emergencies  See telemetry face sheet for immediately available MD    Location  [facility]    Staff Present  Benay Pike, Exercise Physiologist;Diane Coad, MS, EP, [facility], Exercise Physiologist    Medication changes reported      No    Fall or balance concerns reported     No    Warm-up and Cool-down  Performed as group-led instruction    Resistance Training Performed  Yes    VAD Patient?  No    PAD/SET Patient?  No      Pain Assessment   Currently in Pain?  No/denies    Pain Score  0-No pain    Multiple Pain Sites  No       Capillary Blood Glucose: No results found for this or any previous visit (from the past 24 hour(s)).    Social History   Tobacco Use  Smoking Status Never Smoker  Smokeless Tobacco Never Used    Goals Met:  Proper associated with RPD/PD & O2 Sat Independence with exercise equipment Using PLB without cueing & demonstrates good technique Exercise tolerated well No report of cardiac concerns or symptoms Strength training completed today  Goals Unmet:  Not Applicable  Comments: Pt able to follow exercise prescription today without complaint.  Will continue to monitor for progression. Check out 1145.   Dr. Sinda Du is Medical Director for [facility] Pulmonary Rehab.

## 2019-01-07 ENCOUNTER — Encounter (HOSPITAL_COMMUNITY)
Admission: RE | Admit: 2019-01-07 | Discharge: 2019-01-07 | Disposition: A | Discharge status: Home / Self Care | Payer: Medicare Other | Source: Ambulatory Visit | Attending: Pulmonary Disease | Admitting: Pulmonary Disease

## 2019-01-07 DIAGNOSIS — J849 Interstitial pulmonary disease, unspecified: Secondary | ICD-10-CM | POA: Diagnosis not present

## 2019-01-07 NOTE — Progress Notes (Signed)
Daily Session Note  Patient Details  Name: Susan Fitzgerald MRN: [id number] Date of Birth: August 30, 1932 Referring Provider:     PULMONARY REHAB OTHER RESP ORIENTATION from 10/08/2018 in [place]  Referring Provider  Icard      Encounter Date: 01/07/2019  Check In: Session Check In - 01/07/19 1045      Check-In   Supervising physician immediately available to respond to emergencies  See telemetry face sheet for immediately available MD    Location  [facility]    Staff Present  Benay Pike, Exercise Physiologist;Diane Coad, MS, EP, [facility], Exercise Physiologist    Medication changes reported      No    Fall or balance concerns reported     No    Warm-up and Cool-down  Performed as group-led instruction    Resistance Training Performed  Yes    VAD Patient?  No    PAD/SET Patient?  No      Pain Assessment   Currently in Pain?  No/denies    Pain Score  0-No pain    Multiple Pain Sites  No       Capillary Blood Glucose: No results found for this or any previous visit (from the past 24 hour(s)).    Social History   Tobacco Use  Smoking Status Never Smoker  Smokeless Tobacco Never Used    Goals Met:  Proper associated with RPD/PD & O2 Sat Independence with exercise equipment Improved SOB with ADL's Using PLB without cueing & demonstrates good technique Exercise tolerated well No report of cardiac concerns or symptoms Strength training completed today  Goals Unmet:  Not Applicable  Comments: Pt able to follow exercise prescription today without complaint.  Will continue to monitor for progression. Check out 1145.   Dr. Sinda Du is Medical Director for [facility] Pulmonary Rehab.

## 2019-01-11 ENCOUNTER — Ambulatory Visit (INDEPENDENT_AMBULATORY_CARE_PROVIDER_SITE_OTHER): Payer: Medicare Other | Admitting: *Deleted

## 2019-01-11 DIAGNOSIS — E538 Deficiency of other specified B group vitamins: Secondary | ICD-10-CM

## 2019-01-11 NOTE — Progress Notes (Signed)
Pt given Cyanocobalamin inj Tolerated well 

## 2019-01-12 ENCOUNTER — Encounter (HOSPITAL_COMMUNITY)
Admission: RE | Admit: 2019-01-12 | Discharge: 2019-01-12 | Disposition: A | Discharge status: Home / Self Care | Payer: Medicare Other | Source: Ambulatory Visit | Attending: Pulmonary Disease | Admitting: Pulmonary Disease

## 2019-01-12 DIAGNOSIS — J849 Interstitial pulmonary disease, unspecified: Secondary | ICD-10-CM | POA: Diagnosis not present

## 2019-01-12 NOTE — Progress Notes (Signed)
Daily Session Note  Patient Details  Name: STORMEY WILBORN MRN: [id number] Date of Birth: May 17, 1932 Referring Provider:     PULMONARY REHAB OTHER RESP ORIENTATION from 10/08/2018 in [place]  Referring Provider  Icard      Encounter Date: 01/12/2019  Check In: Session Check In - 01/12/19 1045      Check-In   Supervising physician immediately available to respond to emergencies  See telemetry face sheet for immediately available MD    Location  [facility]    Staff Present  Benay Pike, Exercise Physiologist;Diane Coad, MS, EP, [facility], Exercise Physiologist    Fall or balance concerns reported     No    Warm-up and Cool-down  Performed as group-led instruction    Resistance Training Performed  Yes    VAD Patient?  No    PAD/SET Patient?  No      Pain Assessment   Currently in Pain?  No/denies    Pain Score  0-No pain    Multiple Pain Sites  No       Capillary Blood Glucose: No results found for this or any previous visit (from the past 24 hour(s)).    Social History   Tobacco Use  Smoking Status Never Smoker  Smokeless Tobacco Never Used    Goals Met:  Proper associated with RPD/PD & O2 Sat Independence with exercise equipment Improved SOB with ADL's Exercise tolerated well No report of cardiac concerns or symptoms Strength training completed today  Goals Unmet:  Not Applicable  Comments: Pt able to follow exercise prescription today without complaint.  Will continue to monitor for progression. Check out 1145.   Dr. Sinda Du is Medical Director for [facility] Pulmonary Rehab.

## 2019-01-13 ENCOUNTER — Ambulatory Visit (INDEPENDENT_AMBULATORY_CARE_PROVIDER_SITE_OTHER)
Admission: RE | Admit: 2019-01-13 | Discharge: 2019-01-13 | Disposition: A | Discharge status: Home / Self Care | Payer: Medicare Other | Source: Ambulatory Visit | Attending: Pulmonary Disease | Admitting: Pulmonary Disease

## 2019-01-13 ENCOUNTER — Ambulatory Visit: Payer: Medicare Other | Admitting: Cardiology

## 2019-01-13 DIAGNOSIS — J849 Interstitial pulmonary disease, unspecified: Secondary | ICD-10-CM | POA: Diagnosis not present

## 2019-01-14 ENCOUNTER — Encounter (HOSPITAL_COMMUNITY)
Admission: RE | Admit: 2019-01-14 | Discharge: 2019-01-14 | Disposition: A | Discharge status: Home / Self Care | Payer: Medicare Other | Source: Ambulatory Visit | Attending: Pulmonary Disease | Admitting: Pulmonary Disease

## 2019-01-14 DIAGNOSIS — J849 Interstitial pulmonary disease, unspecified: Secondary | ICD-10-CM

## 2019-01-14 NOTE — Progress Notes (Signed)
Daily Session Note  Patient Details  Name: Susan Fitzgerald MRN: [id number] Date of Birth: 1932/02/18 Referring Provider:     PULMONARY REHAB OTHER RESP ORIENTATION from 10/08/2018 in [place]  Referring Provider  Icard      Encounter Date: 01/14/2019  Check In: Session Check In - 01/14/19 1045      Check-In   Supervising physician immediately available to respond to emergencies  See telemetry face sheet for immediately available MD    Location  [facility]    Staff Present  Aundra Dubin, RN, BSN;Diane Coad, MS, EP, [facility], Exercise Physiologist;Other    Medication changes reported      No    Fall or balance concerns reported     No    Warm-up and Cool-down  Performed as group-led instruction    Resistance Training Performed  Yes    VAD Patient?  No    PAD/SET Patient?  No      Pain Assessment   Currently in Pain?  No/denies    Pain Score  0-No pain    Multiple Pain Sites  No       Capillary Blood Glucose: No results found for this or any previous visit (from the past 24 hour(s)).    Social History   Tobacco Use  Smoking Status Never Smoker  Smokeless Tobacco Never Used    Goals Met:  Proper associated with RPD/PD & O2 Sat Independence with exercise equipment Improved SOB with ADL's Using PLB without cueing & demonstrates good technique Exercise tolerated well No report of cardiac concerns or symptoms Strength training completed today  Goals Unmet:  Not Applicable  Comments: Pt able to follow exercise prescription today without complaint.  Will continue to monitor for progression. Check out 1145.   Dr. Sinda Du is Medical Director for [facility] Pulmonary Rehab.

## 2019-01-19 ENCOUNTER — Encounter (HOSPITAL_COMMUNITY)
Admission: RE | Admit: 2019-01-19 | Discharge: 2019-01-19 | Disposition: A | Discharge status: Home / Self Care | Payer: Medicare Other | Source: Ambulatory Visit | Attending: Pulmonary Disease | Admitting: Pulmonary Disease

## 2019-01-19 DIAGNOSIS — J849 Interstitial pulmonary disease, unspecified: Secondary | ICD-10-CM | POA: Diagnosis not present

## 2019-01-19 NOTE — Progress Notes (Signed)
Daily Session Note  Patient Details  Name: Susan Fitzgerald MRN: [id number] Date of Birth: 06/12/1932 Referring Provider:     PULMONARY REHAB OTHER RESP ORIENTATION from 10/08/2018 in [place]  Referring Provider  Icard      Encounter Date: 01/19/2019  Check In: Session Check In - 01/19/19 1045      Check-In   Supervising physician immediately available to respond to emergencies  See telemetry face sheet for immediately available MD    Location  [facility]    Staff Present  Russella Dar, MS, EP, [facility], Exercise Physiologist;Zulay Corrie Zachery Conch, Exercise Physiologist    Medication changes reported      No    Fall or balance concerns reported     No    Warm-up and Cool-down  Performed as group-led instruction    Resistance Training Performed  Yes    VAD Patient?  No    PAD/SET Patient?  No      Pain Assessment   Currently in Pain?  No/denies    Pain Score  0-No pain    Multiple Pain Sites  No       Capillary Blood Glucose: No results found for this or any previous visit (from the past 24 hour(s)).    Social History   Tobacco Use  Smoking Status Never Smoker  Smokeless Tobacco Never Used    Goals Met:  Proper associated with RPD/PD & O2 Sat Independence with exercise equipment Improved SOB with ADL's Exercise tolerated well No report of cardiac concerns or symptoms Strength training completed today  Goals Unmet:  Not Applicable  Comments: Pt able to follow exercise prescription today without complaint.  Will continue to monitor for progression. Check out 1145.   Dr. Kate Sable is Medical Director for [facility].

## 2019-01-20 ENCOUNTER — Ambulatory Visit (INDEPENDENT_AMBULATORY_CARE_PROVIDER_SITE_OTHER): Payer: Medicare Other | Admitting: Pulmonary Disease

## 2019-01-20 ENCOUNTER — Encounter: Payer: Self-pay | Admitting: Pulmonary Disease

## 2019-01-20 ENCOUNTER — Ambulatory Visit: Payer: Medicare Other | Admitting: Pulmonary Disease

## 2019-01-20 VITALS — BP 150/80 | HR 86 | Ht 59.5 in | Wt 121.4 lb

## 2019-01-20 DIAGNOSIS — I5032 Chronic diastolic (congestive) heart failure: Secondary | ICD-10-CM

## 2019-01-20 DIAGNOSIS — M069 Rheumatoid arthritis, unspecified: Secondary | ICD-10-CM

## 2019-01-20 DIAGNOSIS — J849 Interstitial pulmonary disease, unspecified: Secondary | ICD-10-CM | POA: Diagnosis not present

## 2019-01-20 DIAGNOSIS — R05 Cough: Secondary | ICD-10-CM

## 2019-01-20 DIAGNOSIS — R059 Cough, unspecified: Secondary | ICD-10-CM

## 2019-01-20 LAB — PULMONARY FUNCTION TEST
DL/VA % pred: 55 %
DL/VA: 2.31 ml/min/mmHg/L
DLCO unc % pred: 56 %
DLCO unc: 9.01 ml/min/mmHg
FEF 25-75 Post: 1.3 L/sec
FEF 25-75 Pre: 1.19 L/sec
FEF2575-%Change-Post: 9 %
FEF2575-%Pred-Post: 137 %
FEF2575-%Pred-Pre: 125 %
FEV1-%CHANGE-POST: 1 %
FEV1-%Pred-Post: 120 %
FEV1-%Pred-Pre: 118 %
FEV1-POST: 1.67 L
FEV1-Pre: 1.64 L
FEV1FVC-%Change-Post: 5 %
FEV1FVC-%Pred-Pre: 100 %
FEV6-%Change-Post: -4 %
FEV6-%Pred-Post: 121 %
FEV6-%Pred-Pre: 126 %
FEV6-Post: 2.15 L
FEV6-Pre: 2.25 L
FEV6FVC-%Change-Post: 0 %
FEV6FVC-%Pred-Post: 107 %
FEV6FVC-%Pred-Pre: 107 %
FVC-%Change-Post: -3 %
FVC-%PRED-POST: 114 %
FVC-%Pred-Pre: 118 %
FVC-Post: 2.17 L
FVC-Pre: 2.25 L
Post FEV1/FVC ratio: 77 %
Post FEV6/FVC ratio: 100 %
Pre FEV1/FVC ratio: 73 %
Pre FEV6/FVC Ratio: 100 %

## 2019-01-20 NOTE — Patient Instructions (Signed)
Thank you for visiting Dr. Valeta Harms at [facility]. Today we recommend the following:  ILD questionnaire to be completed prior to next visit. We will schedule new consultation follow-up appointment in the interstitial lung disease clinic with Dr. [name] Browner. (next available appt)  Return in about 3 months (around 04/22/2019).  With Dr. Valeta Harms.

## 2019-01-20 NOTE — Progress Notes (Signed)
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  Subjective:   PATIENT ID: Susan Fitzgerald GENDER: female DOB: 03-02-1932, MRN: [id number]  Chief Complaint  Patient presents with  . Follow-up    PFT today.     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   Over the past year has notice her SOB. However of the past several months her SOB has been limiting her activity. She was an avid golfer and has recently been unable due to her SOB. She was started on prednisone for costochondritis back in June. During that time while she was on prednisone she felt much better. Started on amiodarone around 2 years ago in 2017, currently taking 27m daily, Higher loading dose at start.  Records from 05/25/2018, Dr. BAmil Amen rheumatology.  Patient had mild RF positive, positive anti-CCP, hand synovitis on exam, hand x-rays May 2017 with right ulnar styloid and early erosions of the right wrist and the second MCP.  She was treated for some time with methotrexate once weekly.  This has been stopped currently after having recent elevation in serum creatinine.  OV 07/08/2018 -since she was last seen in the office we obtained a high-resolution CT scan.  Per radiology there was evidence of congestive heart failure.  She was since followed up with cardiology and they increased her diuretic regimen.  She was started on oxygen therapy after her last visit.  OV 08/10/2018: She was started on tapering prednisone dose at her last visit.  Unfortunately on 07/26/2018 patient suffered a fall as well as a large skin tear along the left anterior tibia.  She recently completed her last dose of prednisone this past Thursday.  She is now feeling like she is almost back to normal.  She does feel  little weak.  She had no longer been using her oxygen as she is not felt like she is needed it.  She has been checking her pulse ox at home all of which have been in the mid 90s.  She denies dyspnea on exertion and shortness of breath cough or hemoptysis.  OV 11/27/2018: Patient seen in the office today after enrollment in pulmonary rehabilitation.  She has been doing well.  Her functional status has greatly improved since we last met.  She has been going to pulmonary rehab twice a week.  At this point time she is no longer requiring oxygen her sats maintained in the mid 90s even with significant exertion during pulmonary rehab.  After that she is normally going shopping on her own at this time.  Her husband is very glad to see that she is almost back to her baseline state after we initially met this past summer.  She has been restarted on methotrexate by her rheumatologist.  She did not tolerate the leflunomide due to significant hair loss.  Otherwise she has no significant respiratory complaints.  OV 01/20/2019: Patient seen today in follow-up.  She had recent high-resolution CT scan which revealed persistence of basilar fibrotic changes.  Some evidence of air trapping.  Today in the office we reviewed these images with her and her husband.  Also prior to the point she had pulmonary function test which revealed an FVC of 2.1 L 114% predicted, FEV1 1.67 L, 120% predicted, ratio 77, TLC  78% and a DLCO 56% predicted.  She has been completing pulmonary rehabilitation as of today she has completed 26 sessions.  She is able to walk at a 2.7 on the treadmill.  She does feel like she has been progressing well there.  She has graduated to the 4 pound weights which she is very happy about.  She does feel that her physicality and her breathing ability with exercise tolerance has greatly improved and has enjoyed pulmonary rehabilitation.  At this point she is somewhat concerned about the CT scan findings that she did receive  in her my chart information.  We have discussed those today.  Past Medical History:  Diagnosis Date  . Arthritis   . Bradycardia 2014  . Calcification of bronchial airway 06/23/2018   01/2017 - See on CT imaging of the chest   . Cataract   . Cerebral vascular disease 06/19/2018   06/19/2018 - MRI with chronic microvascular ischemic change  . Diverticulosis   . Dyspnea    periodically  . Dysrhythmia    a - fib  . Heart murmur    ??? bruit  . History of stress test    a. 04/09/13-nuclear stress-no ischemia low risk  . Hypertension   . Menopause   . Osteoporosis   . PAF (paroxysmal atrial fibrillation) ([place])    a. s/p prior DCCV; b. On flecainide & coumadin (CHA2DS2VASc = 4);  c. 03/2016 Echo: EF 55-60%, mod LVH, mod AI, mild to mod TR, PASP 27mHg.  .Marland KitchenPelvic fracture (HCC)      Family History  Problem Relation Age of Onset  . Stroke Mother        cerebral hemorrhage  . Heart disease Father        MI  . Heart attack Father   . Vision loss Father   . Heart disease Brother   . Heart attack Brother   . Cancer Maternal Aunt        breast  . Cancer Brother   . Heart disease Brother   . Cancer Daughter        breast    Past Surgical History:  Procedure Laterality Date  . BIOPSY BREAST    . BIOPSY BREAST     right & benign  . BREAST SURGERY    . CARDIOVERSION N/A 10/30/2016   Procedure: CARDIOVERSION;  Surgeon: DLarey Dresser MD;  [place] [facility]  Service: Cardiovascular;  Laterality: N/A;  . CARDIOVERSION N/A 08/20/2017   Procedure: CARDIOVERSION;  Surgeon: SJerline Pain MD;  [facility];  Service: Cardiovascular;  Laterality: N/A;  . CATARACT EXTRACTION    . EYE SURGERY     cataracts  . TEE WITHOUT CARDIOVERSION N/A 10/30/2016   Procedure: TRANSESOPHAGEAL ECHOCARDIOGRAM (TEE);  Surgeon: DLarey Dresser MD;  [place] [facility]  Service: Cardiovascular;  Laterality: N/A;  . TOTAL HIP ARTHROPLASTY Right 01/27/2017   Procedure: TOTAL HIP ARTHROPLASTY  ANTERIOR APPROACH;  Surgeon: BRod Can MD;  [facility]  Service: Orthopedics;  Laterality: Right;    Social History   Socioeconomic History  . Marital status: Married    Spouse name: BRush Landmark  . Number of children: 2  . Years of education: Not on file  . Highest education level: Not on file  Occupational History  . Not on file  Social Needs  . Financial resource strain: Not hard at all  . Food insecurity:    Worry: Never true    Inability: Never true  .  Transportation needs:    Medical: No    Non-medical: No  Tobacco Use  . Smoking status: Never Smoker  . Smokeless tobacco: Never Used  Substance and Sexual Activity  . Alcohol use: No  . Drug use: No  . Sexual activity: Yes  Lifestyle  . Physical activity:    Days per week: 2 days    Minutes per session: 60 min  . Stress: Only a little  Relationships  . Social connections:    Talks on phone: More than three times a week    Gets together: More than three times a week    Attends religious service: More than 4 times per year    Active member of club or organization: Yes    Attends meetings of clubs or organizations: More than 4 times per year    Relationship status: Married  . Intimate partner violence:    Fear of current or ex partner: No    Emotionally abused: No    Physically abused: No    Forced sexual activity: No  Other Topics Concern  . Not on file  Social History Narrative  . Not on file     Allergies  Allergen Reactions  . Benazepril Hcl Cough     Outpatient Medications Prior to Visit  Medication Sig Dispense Refill  . apixaban (ELIQUIS) 2.5 MG TABS tablet Take 2.5 mg by mouth 2 (two) times daily.     . calcium-vitamin D (OSCAL WITH D) 250-125 MG-UNIT per tablet Take 1 tablet by mouth 2 (two) times daily.    . Cyanocobalamin (VITAMIN B-12 IJ) Inject once a month    . diltiazem (DILACOR XR) 180 MG 24 hr capsule Take 180 mg by mouth daily.    . folic acid (FOLVITE) 1 MG tablet Take 2 mg by  mouth daily.     . furosemide (LASIX) 20 MG tablet Take 1 tablet (20 mg total) by mouth daily. 180 tablet 1  . gabapentin (NEURONTIN) 100 MG capsule Take 100 mg by mouth at bedtime.     Marland Kitchen levothyroxine (SYNTHROID, LEVOTHROID) 75 MCG tablet TAKE 1 TABLET BY MOUTH  DAILY 90 tablet 3  . lisinopril (PRINIVIL,ZESTRIL) 20 MG tablet Take 1 tablet (20 mg total) by mouth daily. 90 tablet 3  . lisinopril-hydrochlorothiazide (PRINZIDE,ZESTORETIC) 20-12.5 MG tablet Take 1 tablet by mouth daily. 90 tablet 3  . Melatonin 5 MG TABS Take 5 mg by mouth at bedtime.    . methotrexate (RHEUMATREX) 2.5 MG tablet Take 10 mg by mouth once a week. Caution:Chemotherapy. Protect from light.    . Multiple Vitamin (MULTIVITAMIN) capsule Take 1 capsule by mouth daily.    . potassium chloride SA (KLOR-CON M20) 20 MEQ tablet Take 1 tablet (20 mEq total) by mouth daily. 90 tablet 1   Facility-Administered Medications Prior to Visit  Medication Dose Route Frequency Provider Last Rate Last Dose  . cyanocobalamin ((VITAMIN B-12)) injection 1,000 mcg  1,000 mcg Intramuscular Q30 days Susan Herb, MD   1,000 mcg at 01/11/19 1427    Review of Systems  Constitutional: Negative for chills, fever, malaise/fatigue and weight loss.  HENT: Negative for hearing loss, sore throat and tinnitus.   Eyes: Negative for blurred vision and double vision.  Respiratory: Positive for cough and sputum production. Negative for hemoptysis, shortness of breath, wheezing and stridor.        Dry cough during the day, sometimes productive in the morning.  Cardiovascular: Negative for chest pain, palpitations, orthopnea, leg swelling and  PND.  Gastrointestinal: Negative for abdominal pain, constipation, diarrhea, heartburn, nausea and vomiting.  Genitourinary: Negative for dysuria, hematuria and urgency.  Musculoskeletal: Negative for joint pain and myalgias.  Skin: Negative for itching and rash.  Neurological: Negative for dizziness, tingling,  weakness and headaches.  Endo/Heme/Allergies: Negative for environmental allergies. Does not bruise/bleed easily.  Psychiatric/Behavioral: Negative for depression. The patient is not nervous/anxious and does not have insomnia.   All other systems reviewed and are negative.   Objective:  Physical Exam Vitals signs reviewed.  Constitutional:      General: She is not in acute distress.    Appearance: She is well-developed.  HENT:     Head: Normocephalic and atraumatic.  Eyes:     General: No scleral icterus.    Conjunctiva/sclera: Conjunctivae normal.     Pupils: Pupils are equal, round, and reactive to light.  Neck:     Musculoskeletal: Neck supple.     Vascular: No JVD.     Trachea: No tracheal deviation.  Cardiovascular:     Rate and Rhythm: Normal rate and regular rhythm.     Heart sounds: Normal heart sounds. No murmur.  Pulmonary:     Effort: Pulmonary effort is normal. No tachypnea, accessory muscle usage or respiratory distress.     Breath sounds: Normal breath sounds. No stridor. No wheezing, rhonchi or rales.     Comments: Faint bibasilar late inspiratory crackles bilaterally Abdominal:     General: Bowel sounds are normal. There is no distension.     Palpations: Abdomen is soft.     Tenderness: There is no abdominal tenderness.  Musculoskeletal:        General: No tenderness.  Lymphadenopathy:     Cervical: No cervical adenopathy.  Skin:    General: Skin is warm and dry.     Capillary Refill: Capillary refill takes less than 2 seconds.     Findings: No rash.  Neurological:     Mental Status: She is alert and oriented to person, place, and time.  Psychiatric:        Behavior: Behavior normal.     Vitals:   01/20/19 0938  BP: (!) 150/80  Pulse: 86  SpO2: 94%  Weight: 121 lb 6.4 oz (55.1 kg)  Height: 4' 11.5" (1.511 m)   94% on RA  BMI Readings from Last 3 Encounters:  01/20/19 24.11 kg/m  12/16/18 22.85 kg/m  12/09/18 22.30 kg/m   Wt Readings  from Last 3 Encounters:  01/20/19 121 lb 6.4 oz (55.1 kg)  12/16/18 117 lb (53.1 kg)  12/09/18 118 lb (53.5 kg)    CBC    Component Value Date/Time   WBC 5.2 11/06/2018 0000   WBC 4.8 08/15/2017 1206   RBC 3.68 (L) 11/06/2018 0000   RBC 4.05 08/15/2017 1206   HGB 11.4 11/06/2018 0000   HCT 34.8 11/06/2018 0000   PLT 177 11/06/2018 0000   MCV 95 11/06/2018 0000   MCH 31.0 11/06/2018 0000   MCH 30.6 08/15/2017 1206   MCHC 32.8 11/06/2018 0000   MCHC 32.0 08/15/2017 1206   RDW 13.0 11/06/2018 0000   LYMPHSABS 1.3 11/06/2018 0000   MONOABS 0.8 02/10/2017 0332   EOSABS 0.1 11/06/2018 0000   BASOSABS 0.1 11/06/2018 0000   Lab Results  Component Value Date   ALT 18 12/09/2018   AST 32 12/09/2018   ALKPHOS 107 12/09/2018   BILITOT 0.2 12/09/2018   Lab Results  Component Value Date   ESRSEDRATE 18 06/16/2018  Lab Results  Component Value Date   CREATININE 1.29 (H) 12/04/2018    Chest Imaging: CT imaging from 2017 was reviewed with basilar intersitial changes (my read)  CXR from 05/2018 reviewed with bilateral prominent interstitial markings. (my read)    High-resolution CT from August 2019 Evidence of interlobular septal thickening and patchy ground glass opacities.  There is evidence in the upper lobes as well as the lower lobes however there is small amounts of bilateral pleural fluid.  This could represent pulmonary edema and decompensation of her known heart failure.  However the upper lobe predominance and is also seen more in the right than the left.  This could represent drug-induced disease. The patient's images have been independently reviewed by me.    High-resolution CT chest 01/13/2019: Peripheral and basilar pattern of subpleural reticulation and groundglass with areas of retraction and bronchiectasis.  These areas are more confluent and organized since the August follow-up images.  She does have stable calcified granulomas.  There is few small scattered areas of  air trapping.  Stable T11 compression fraction.  Read from our thoracic radiologist with pulmonary parenchymal pattern of fibrosis that may be due to fibrotic NSIP versus UIP.  These images were reviewed with the patient today in the office. The patient's images have been independently reviewed by me.    Pulmonary Functions Testing Results:  07/07/2018 TLC - 56%  6MWT: Distance 288 meters, 2LNC   PFT Results Latest Ref Rng & Units 01/20/2019  FVC-Pre L 2.25  FVC-Predicted Pre % 118  FVC-Post L 2.17  FVC-Predicted Post % 114  Pre FEV1/FVC % % 73  Post FEV1/FCV % % 77  FEV1-Pre L 1.64  FEV1-Predicted Pre % 118  FEV1-Post L 1.67  DLCO UNC% % 56  DLCO COR %Predicted % 55     FeNO: None   Pathology: None   Echocardiogram:  01/27/2018 Study Conclusions  - Left ventricle: The cavity size was normal. Wall thickness was   increased in a pattern of mild LVH. There was mild focal basal   hypertrophy of the septum. Systolic function was normal. The   estimated ejection fraction was in the range of 55% to 60%. Wall   motion was normal; there were no regional wall motion   abnormalities. Doppler parameters are consistent with abnormal   left ventricular relaxation (grade 1 diastolic dysfunction). - Aortic valve: There was mild regurgitation. - Ascending aorta: The ascending aorta was mildly dilated. - Mitral valve: Calcified annulus. - Pulmonary arteries: Systolic pressure was mildly increased.  Impressions:  - Normal LV systolic function; mild diastolic dysfunction; mild   LVH; calcified aortic valve with mild AI; mildly dilated   ascending aorta; mild TR; mild pulmonary hypertension.  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  She was treated with steroids during that time.  She also has a history of rheumatoid arthritis on methotrexate.  Here today for follow-up imaging as well as pulmonary function tests.  Repeat imaging does show a more confluence of basilar predominant fibrotic changes.  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  I do think that she would benefit from discussion  of her fibrotic changes with 1 of our ILD experts.  We will have follow-up scheduled for a new consultation slot next available with Dr. [name] Browner.  Today we will give her a ILD questionnaire to be thorough.  I encouraged her to continue her pulmonary rehabilitation techniques at home as she states that she is fixing to finish up her time there.  Return to clinic in 3 months with me.  Greater than 50% of this patient's 25-minute office with him face-to-face cussing by recommendation treatment plan as well as review of PFTs and CT chest imaging.   Current Outpatient Medications:  .  apixaban (ELIQUIS) 2.5 MG TABS tablet, Take 2.5 mg by mouth 2 (two) times daily. , Disp: , Rfl:  .  calcium-vitamin D (OSCAL WITH D) 250-125 MG-UNIT per tablet, Take 1 tablet by mouth 2 (two) times daily., Disp: , Rfl:  .  Cyanocobalamin (VITAMIN B-12 IJ), Inject once a month, Disp: , Rfl:  .  diltiazem (DILACOR XR) 180 MG 24 hr capsule, Take 180 mg by mouth daily., Disp: , Rfl:  .  folic acid (FOLVITE) 1 MG tablet, Take 2 mg by mouth daily. , Disp: , Rfl:  .  furosemide (LASIX) 20 MG tablet, Take 1 tablet (20 mg total) by mouth daily., Disp: 180 tablet, Rfl: 1 .  gabapentin (NEURONTIN) 100 MG capsule, Take 100 mg by mouth at bedtime. , Disp: , Rfl:  .  levothyroxine (SYNTHROID, LEVOTHROID) 75 MCG tablet, TAKE 1 TABLET BY MOUTH  DAILY, Disp: 90 tablet, Rfl: 3 .  lisinopril (PRINIVIL,ZESTRIL) 20 MG  tablet, Take 1 tablet (20 mg total) by mouth daily., Disp: 90 tablet, Rfl: 3 .  lisinopril-hydrochlorothiazide (PRINZIDE,ZESTORETIC) 20-12.5 MG tablet, Take 1 tablet by mouth daily., Disp: 90 tablet, Rfl: 3 .  Melatonin 5 MG TABS, Take 5 mg by mouth at bedtime., Disp: , Rfl:  .  methotrexate (RHEUMATREX) 2.5 MG tablet, Take 10 mg by mouth once a week. Caution:Chemotherapy. Protect from light., Disp: , Rfl:  .  Multiple Vitamin (MULTIVITAMIN) capsule, Take 1 capsule by mouth daily., Disp: , Rfl:  .  potassium chloride SA (KLOR-CON M20) 20 MEQ tablet, Take 1 tablet (20 mEq total) by mouth daily., Disp: 90 tablet, Rfl: 1  Current Facility-Administered Medications:  .  cyanocobalamin ((VITAMIN B-12)) injection 1,000 mcg, 1,000 mcg, Intramuscular, Q30 days, Susan Herb, MD, 1,000 mcg at 01/11/19 Susan Dawsen Krieger, DO [facility] 01/20/2019 9:44 AM

## 2019-01-20 NOTE — Progress Notes (Signed)
PFT completed today.  

## 2019-01-21 ENCOUNTER — Encounter (HOSPITAL_COMMUNITY)
Admission: RE | Admit: 2019-01-21 | Discharge: 2019-01-21 | Disposition: A | Discharge status: Home / Self Care | Payer: Medicare Other | Source: Ambulatory Visit | Attending: Pulmonary Disease | Admitting: Pulmonary Disease

## 2019-01-21 VITALS — Ht 59.5 in | Wt 121.3 lb

## 2019-01-21 DIAGNOSIS — J849 Interstitial pulmonary disease, unspecified: Secondary | ICD-10-CM | POA: Diagnosis not present

## 2019-01-21 NOTE — Progress Notes (Signed)
Discharge Progress Report  Patient Details  Name: Susan Fitzgerald MRN: [id number] Date of Birth: 1931-11-24 Referring Provider:     PULMONARY REHAB OTHER RESP ORIENTATION from 10/08/2018 in [place]  Referring Provider  Icard       Number of Visits: 27  Reason for Discharge:  Patient reached a stable level of exercise. Patient independent in their exercise. Patient has met program and personal goals.  Smoking History:  Social History   Tobacco Use  Smoking Status Never Smoker  Smokeless Tobacco Never Used    Diagnosis:  ILD (interstitial lung disease) ([place])  ADL UCSD: Pulmonary Assessment Scores    Row Name 10/08/18 1532 01/21/19 1522       ADL UCSD   ADL Phase  Entry  Exit    SOB Score total  28  13    Rest  0  0    Walk  5  1    Stairs  3  1    Bath  2  1    Dress  2  1    Shop  2  1      CAT Score   CAT Score  10  9      mMRC Score   mMRC Score  2  1       Initial Exercise Prescription: Initial Exercise Prescription - 10/08/18 1500      Date of Initial Exercise RX and Referring Provider   Date  10/08/18    Referring Provider  Icard    Expected Discharge Date  01/08/19      Treadmill   MPH  1.2    Grade  0    Minutes  17    METs  1.91      NuStep   Level  1    SPM  70    Minutes  17    METs  2      Prescription Details   Frequency (times per week)  2    Duration  Progress to 30 minutes of continuous aerobic without signs/symptoms of physical distress      Intensity   THRR 40-80% of Max Heartrate  [phone]    Ratings of Perceived Exertion  11-13    Perceived Dyspnea  0-4      Progression   Progression  Continue to progress workloads to maintain intensity without signs/symptoms of physical distress.      Resistance Training   Training Prescription  Yes    Weight  1    Reps  10-15       Discharge Exercise Prescription (Final Exercise Prescription Changes): Exercise Prescription Changes - 01/13/19 1500       Response to Exercise   Blood Pressure (Admit)  138/64    Blood Pressure (Exercise)  158/64    Blood Pressure (Exit)  126/70    Heart Rate (Admit)  65 bpm    Heart Rate (Exercise)  100 bpm    Heart Rate (Exit)  95 bpm    Oxygen Saturation (Admit)  95 %    Oxygen Saturation (Exercise)  92 %    Oxygen Saturation (Exit)  96 %    Rating of Perceived Exertion (Exercise)  12    Perceived Dyspnea (Exercise)  12    Duration  Continue with 30 min of aerobic exercise without signs/symptoms of physical distress.    Intensity  THRR unchanged      Progression   Progression  Continue to progress workloads  to maintain intensity without signs/symptoms of physical distress.    Average METs  4      Resistance Training   Training Prescription  Yes    Weight  4    Reps  10-15      Treadmill   MPH  2.7    Grade  0    Minutes  17    METs  3.06      NuStep   Level  --   switched to XR     Recumbant Elliptical   Level  1    RPM  56    Watts  73    Minutes  17    METs  4.4      Home Exercise Plan   Plans to continue exercise at  Home (comment)    Frequency  Add 3 additional days to program exercise sessions.    Initial Home Exercises Provided  10/08/18       Functional Capacity: 6 Minute Walk    Row Name 10/08/18 1503 01/21/19 1259       6 Minute Walk   Phase  Initial  Discharge    Distance  1400 feet  1600 feet    Distance % Change  -  14.2 %    Distance Feet Change  -  200 ft    Walk Time  6 minutes  6 minutes    # of Rest Breaks  0  0    MPH  2.65  3.03    METS  3.03  3.32    RPE  11  12    Perceived Dyspnea   9  8    VO2 Peak  9.04  11.3    Symptoms  No  -    Resting HR  74 bpm  73 bpm    Resting BP  160/60  170/66    Resting Oxygen Saturation   96 %  96 %    Exercise Oxygen Saturation  during 6 min walk  91 %  92 %    Max Ex. HR  102 bpm  98 bpm    Max Ex. BP  148/70  180/66    2 Minute Post BP  124/66  164/72       Psychological, QOL, Others -  Outcomes: PHQ 2/9: Depression screen [facility] 2/9 01/21/2019 12/09/2018 10/28/2018 10/08/2018 09/03/2018  Decreased Interest 0 1 0 0 0  Down, Depressed, Hopeless 0 0 0 0 0  PHQ - 2 Score 0 1 0 0 0  Altered sleeping 2 - - 2 -  Tired, decreased energy 0 - - 1 -  Change in appetite 0 - - 1 -  Feeling bad or failure about yourself  0 - - 0 -  Trouble concentrating 0 - - 0 -  Moving slowly or fidgety/restless 0 - - 0 -  Suicidal thoughts 0 - - 0 -  PHQ-9 Score 2 - - 4 -  Difficult doing work/chores Not difficult at all - - Somewhat difficult -  Some recent data might be hidden    Quality of Life: Quality of Life - 01/21/19 1302      Quality of Life   Select  Quality of Life      Quality of Life Scores   Health/Function Pre  20.07 %    Health/Function Post  25.64 %    Health/Function % Change  27.75 %    Socioeconomic Pre  28.75 %    Socioeconomic Post  28 %    Socioeconomic % Change   -2.61 %    Psych/Spiritual Pre  24 %    Psych/Spiritual Post  25.5 %    Psych/Spiritual % Change  6.25 %    Family Pre  27 %    Family Post  28.8 %    Family % Change  6.67 %    GLOBAL Pre  23.17 %    GLOBAL Post  26.55 %    GLOBAL % Change  14.59 %       Personal Goals: Goals established at orientation with interventions provided to work toward goal. Personal Goals and Risk Factors at Admission - 10/08/18 1538      Core Components/Risk Factors/Patient Goals on Admission    Weight Management  Weight Maintenance    Improve shortness of breath with ADL's  Yes    Intervention  Provide education, individualized exercise plan and daily activity instruction to help decrease symptoms of SOB with activities of daily living.    Expected Outcomes  Short Term: Improve cardiorespiratory fitness to achieve a reduction of symptoms when performing ADLs;Long Term: Be able to perform more ADLs without symptoms or delay the onset of symptoms    Hypertension  Yes    Intervention  Provide education on lifestyle  modifcations including regular physical activity/exercise, weight management, moderate sodium restriction and increased consumption of fresh fruit, vegetables, and low fat dairy, alcohol moderation, and smoking cessation.;Monitor prescription use compliance.    Expected Outcomes  Short Term: Continued assessment and intervention until BP is < 140/50m HG in hypertensive participants. < 130/874mHG in hypertensive participants with diabetes, heart failure or chronic kidney disease.;Long Term: Maintenance of blood pressure at goal levels.    Personal Goal Other  Yes    Personal Goal  Breathe better. Has less SOB with activities. Gain strength and stamina. Get back to playing golf.     Intervention  Patient will attend PR 2 days/week and supplement with exercise 3 days/week.     Expected Outcomes  Patient will complete the program and meet her personal goals.         Personal Goals Discharge: Goals and Risk Factor Review    Row Name 11/06/18 1309 11/26/18 1556 12/24/18 1320 01/21/19 0844 01/21/19 1529     Core Components/Risk Factors/Patient Goals Review   Personal Goals Review  Weight Management/Obesity;Hypertension;Improve shortness of breath with ADL's Breathe better; gain strength; do activities w/o SOB; golf again.   Weight Management/Obesity;Hypertension;Improve shortness of breath with ADL's Breathe better; gain strength; do activities w/o SOB; golf again.   Weight Management/Obesity;Hypertension;Improve shortness of breath with ADL's Breathe better; gain strength; do activities w/o SOB; golf again.   Weight Management/Obesity;Hypertension;Improve shortness of breath with ADL's Breathe better; gain strength; do activities w/o SOB; play golf again.  Weight Management/Obesity;Hypertension;Improve shortness of breath with ADL's Breathe better; gain strength; do activity w/o SOB.   Review  Patient has completed 8 sessions losing 1.5 lbs since her initial visit. She is doing well in the program with  progression. She says she does feel stronger and has more energy. She feels like she will be ready to golf in the Spring if she continues the program. Her breathing has improved. Will continue to monitor for progress.   Patient has completed 11 sessions maintaining her weight since last 30 day review. She continues to do well in the program with progression. She says her energy continues to improve and she is breathing better. She feels like she  will be ready to golf soon. Will continue to monitor for progress.   Patient has completed 19 session gaining 1 lb since last 30 day review. Patient states the program has given me a good emotional aura with improved stamina and breathing. She feels the program and really benefited her. She is ready to play golf in the Spring. She also states she has learned a lot in the education sessions. Will continue to monitor for progress.   Patient has completed 26 sessions gaining 2 lbs since last 30 day review. She continues to do well in the program with progression. She states she is able to walk her basement stairs without SOB or fatigue. She feels stronger and is breathing better. She is ready to play golf. She feels the program has helped her acheive her personal goals. He blood pressue is better controlled now since her medications have been adjusted. Will continue to monitor for progress.   Patient has graduated with 27 sessions gaining 2 lbs overall. She did well in the program. Her exit walk test improved by 14.2% and her exit measurements improved in grip strength and balance. She states she is able to walk her basement stairs without SOB or fatigue. She feels stronger and is breathing better. She is ready to play golf. She feels the program has helped her acheive her personal goals. He blood pressue is better controlled now since her medications have been adjusted. She states she has learned a lot from the education sessions including stress management and breathing  techniques. She plans to continue exercising by walking on her treadmill at home and playing golf. PR will f/u for 1 year.     Expected Outcomes  Patient will continue to attend sessions and complete the program meeting her personal goals.   Patient will continue to attend sessions and complete the program meeting her personal goals.   Patient will continue to attend sessions and complete the program meeting her personal goals.   Patient will continue to attend sessions and complete the program meeting her personal goals.   Patient plans to continue exercising at home on her treadmill and by playing golf and continue to meet her personal goals.       Exercise Goals and Review: Exercise Goals    Row Name 10/08/18 1506             Exercise Goals   Increase Physical Activity  Yes       Intervention  Provide advice, education, support and counseling about physical activity/exercise needs.;Develop an individualized exercise prescription for aerobic and resistive training based on initial evaluation findings, risk stratification, comorbidities and participant's personal goals.       Expected Outcomes  Short Term: Attend rehab on a regular basis to increase amount of physical activity.       Increase Strength and Stamina  Yes       Intervention  Provide advice, education, support and counseling about physical activity/exercise needs.;Develop an individualized exercise prescription for aerobic and resistive training based on initial evaluation findings, risk stratification, comorbidities and participant's personal goals.       Expected Outcomes  Short Term: Increase workloads from initial exercise prescription for resistance, speed, and METs.       Able to understand and use rate of perceived exertion (RPE) scale  Yes       Intervention  Provide education and explanation on how to use RPE scale       Expected Outcomes  Short Term:  Able to use RPE daily in rehab to express subjective intensity  level;Long Term:  Able to use RPE to guide intensity level when exercising independently       Able to understand and use Dyspnea scale  Yes       Intervention  Provide education and explanation on how to use Dyspnea scale       Expected Outcomes  Long Term: Able to use Dyspnea scale to guide intensity level when exercising independently;Short Term: Able to use Dyspnea scale daily in rehab to express subjective sense of shortness of breath during exertion       Knowledge and understanding of Target Heart Rate Range (THRR)  Yes       Intervention  Provide education and explanation of THRR including how the numbers were predicted and where they are located for reference       Expected Outcomes  Short Term: Able to state/look up THRR;Long Term: Able to use THRR to govern intensity when exercising independently;Short Term: Able to use daily as guideline for intensity in rehab       Able to check pulse independently  Yes       Intervention  Provide education and demonstration on how to check pulse in carotid and radial arteries.;Review the importance of being able to check your own pulse for safety during independent exercise       Expected Outcomes  Short Term: Able to explain why pulse checking is important during independent exercise;Long Term: Able to check pulse independently and accurately       Understanding of Exercise Prescription  Yes       Intervention  Provide education, explanation, and written materials on patient's individual exercise prescription       Expected Outcomes  Short Term: Able to explain program exercise prescription;Long Term: Able to explain home exercise prescription to exercise independently          Exercise Goals Re-Evaluation: Exercise Goals Re-Evaluation    Row Name 11/05/18 1520 11/26/18 0748 12/22/18 1025 01/20/19 1443       Exercise Goal Re-Evaluation   Exercise Goals Review  Increase Physical Activity;Increase Strength and Stamina;Able to understand and use  rate of perceived exertion (RPE) scale;Able to understand and use Dyspnea scale;Knowledge and understanding of Target Heart Rate Range (THRR);Able to check pulse independently;Understanding of Exercise Prescription  Increase Physical Activity;Increase Strength and Stamina;Able to understand and use rate of perceived exertion (RPE) scale;Able to understand and use Dyspnea scale;Knowledge and understanding of Target Heart Rate Range (THRR);Able to check pulse independently;Understanding of Exercise Prescription  Increase Physical Activity;Increase Strength and Stamina;Able to understand and use rate of perceived exertion (RPE) scale;Able to understand and use Dyspnea scale;Knowledge and understanding of Target Heart Rate Range (THRR);Able to check pulse independently;Understanding of Exercise Prescription  Increase Physical Activity;Increase Strength and Stamina;Able to understand and use rate of perceived exertion (RPE) scale;Able to understand and use Dyspnea scale;Knowledge and understanding of Target Heart Rate Range (THRR);Able to check pulse independently;Understanding of Exercise Prescription    Comments  Pt. has done well in the program, she has attended 8 sessions so far. She is very eager to come every day and work to increase strength and stamina.   Pt. continues to do great in the program. She was out for 2 weeks due to the holiday season but returned just as spunky and ready to work as ever. She is eager to increase her stength and stamina. We will continue to progress her workloads as tolerated.  Pt. is doing great in the program. She works hard everyday and is very deligent about increasing her distance on the NuStep, she is slightly obsessed with how many steps per minute she pumps. We will continue to monitor and progress.   Susan Fitzgerald continues to do excellent in PR. She has attended 26 exercise sessions so far and each time se comes she pushes herself to work harder than the time before. She is still  eager to increase her workloads as soon as possible, but always handles them with ease. She has reported feeling stronger since beginning the program and even mentioned that her husband says he can tell a difference in how she is able to move around the house.    Expected Outcomes  increase strength, imrpove ability to perform ADL's without SOB   increase strength, imrpove ability to perform ADL's without SOB   increase strength, imrpove ability to perform ADL's without SOB   increase strength, imrpove ability to perform ADL's without SOB        Nutrition & Weight - Outcomes: Pre Biometrics - 10/08/18 1506      Pre Biometrics   Height  5' 1" (1.549 m)    Weight  53.8 kg    Waist Circumference  28 inches    Hip Circumference  34.5 inches    Waist to Hip Ratio  0.81 %    BMI (Calculated)  22.42    Triceps Skinfold  14 mm    % Body Fat  31.7 %    Grip Strength  17.03 kg    Flexibility  0 in    Single Leg Stand  1.5 seconds      Post Biometrics - 01/21/19 1301       Post  Biometrics   Height  4' 11.5" (1.511 m)    Weight  55 kg    Waist Circumference  27.5 inches    Hip Circumference  35 inches    Waist to Hip Ratio  0.79 %    BMI (Calculated)  24.09    Triceps Skinfold  12 mm    % Body Fat  30.9 %    Grip Strength  18.2 kg    Flexibility  0 in    Single Leg Stand  5.5 seconds       Nutrition: Nutrition Therapy & Goals - 01/21/19 0844      Nutrition Therapy   RD appointment deferred  Yes      Personal Nutrition Goals   Comments  Patient reminded of RD appointment today. She continues to say she has not changed her diet since she started the program. Will continue to monitor for progress.       Intervention Plan   Intervention  Nutrition handout(s) given to patient.       Nutrition Discharge: Nutrition Assessments - 01/21/19 1523      MEDFICTS Scores   Pre Score  32    Post Score  30    Score Difference  -2       Education Questionnaire Score: Knowledge  Questionnaire Score - 01/21/19 1528      Knowledge Questionnaire Score   Pre Score  14/18    Post Score  12/18       Goals reviewed with patient; copy given to patient.

## 2019-01-21 NOTE — Progress Notes (Signed)
Pulmonary Individual Treatment Plan  Patient Details  Name: Susan Fitzgerald MRN: [id number] Date of Birth: 1932-07-21 Referring Provider:     PULMONARY REHAB OTHER RESP ORIENTATION from 10/08/2018 in [facility]  Referring Provider  Icard      Initial Encounter Date:    PULMONARY REHAB OTHER RESP ORIENTATION from 10/08/2018 in [facility]  Date  10/08/18      Visit Diagnosis: ILD (interstitial lung disease) ([place])  Patient's Home Medications on Admission:   Current Outpatient Medications:  .  apixaban (ELIQUIS) 2.5 MG TABS tablet, Take 2.5 mg by mouth 2 (two) times daily. , Disp: , Rfl:  .  calcium-vitamin D (OSCAL WITH D) 250-125 MG-UNIT per tablet, Take 1 tablet by mouth 2 (two) times daily., Disp: , Rfl:  .  Cyanocobalamin (VITAMIN B-12 IJ), Inject once a month, Disp: , Rfl:  .  diltiazem (DILACOR XR) 180 MG 24 hr capsule, Take 180 mg by mouth daily., Disp: , Rfl:  .  folic acid (FOLVITE) 1 MG tablet, Take 2 mg by mouth daily. , Disp: , Rfl:  .  furosemide (LASIX) 20 MG tablet, Take 1 tablet (20 mg total) by mouth daily., Disp: 180 tablet, Rfl: 1 .  gabapentin (NEURONTIN) 100 MG capsule, Take 100 mg by mouth at bedtime. , Disp: , Rfl:  .  levothyroxine (SYNTHROID, LEVOTHROID) 75 MCG tablet, TAKE 1 TABLET BY MOUTH  DAILY, Disp: 90 tablet, Rfl: 3 .  lisinopril (PRINIVIL,ZESTRIL) 20 MG tablet, Take 1 tablet (20 mg total) by mouth daily., Disp: 90 tablet, Rfl: 3 .  lisinopril-hydrochlorothiazide (PRINZIDE,ZESTORETIC) 20-12.5 MG tablet, Take 1 tablet by mouth daily., Disp: 90 tablet, Rfl: 3 .  Melatonin 5 MG TABS, Take 5 mg by mouth at bedtime., Disp: , Rfl:  .  methotrexate (RHEUMATREX) 2.5 MG tablet, Take 10 mg by mouth once a week. Caution:Chemotherapy. Protect from light., Disp: , Rfl:  .  Multiple Vitamin (MULTIVITAMIN) capsule, Take 1 capsule by mouth daily., Disp: , Rfl:  .  potassium chloride SA (KLOR-CON M20) 20 MEQ tablet, Take 1  tablet (20 mEq total) by mouth daily., Disp: 90 tablet, Rfl: 1  Current Facility-Administered Medications:  .  cyanocobalamin ((VITAMIN B-12)) injection 1,000 mcg, 1,000 mcg, Intramuscular, Q30 days, Chipper Herb, MD, 1,000 mcg at 01/11/19 1427  Past Medical History: Past Medical History:  Diagnosis Date  . Arthritis   . Bradycardia 2014  . Calcification of bronchial airway 06/23/2018   01/2017 - See on CT imaging of the chest   . Cataract   . Cerebral vascular disease 06/19/2018   06/19/2018 - MRI with chronic microvascular ischemic change  . Diverticulosis   . Dyspnea    periodically  . Dysrhythmia    a - fib  . Heart murmur    ??? bruit  . History of stress test    a. 04/09/13-nuclear stress-no ischemia low risk  . Hypertension   . Menopause   . Osteoporosis   . PAF (paroxysmal atrial fibrillation) ([place])    a. s/p prior DCCV; b. On flecainide & coumadin (CHA2DS2VASc = 4);  c. 03/2016 Echo: EF 55-60%, mod LVH, mod AI, mild to mod TR, PASP 61mHg.  .Marland KitchenPelvic fracture ([facility])     Tobacco Use: Social History   Tobacco Use  Smoking Status Never Smoker  Smokeless Tobacco Never Used    Labs: Recent Review [employer], physiological   Labs for ITP Cardiac and Pulmonary Rehab Latest Ref Rng & Units 09/16/2016  03/24/2017 09/15/2017 12/04/2018 12/09/2018   Cholestrol 100 - 199 mg/dL 149 169 166 - 176   LDLCALC 0 - 99 mg/dL - - - - 98   LDLDIRECT 0 - 99 mg/dL - - - - -   HDL >39 mg/dL 65 77 71 - 63   Trlycerides 0 - 149 mg/dL 73 55 67 - 74   Hemoglobin A1c <7.0 % - - - 5.0 -      Capillary Blood Glucose: No results found for: GLUCAP   Pulmonary Assessment Scores: Pulmonary Assessment Scores    Row Name 10/08/18 1532         ADL UCSD   ADL Phase  Entry     SOB Score total  28     Rest  0     Walk  5     Stairs  3     Bath  2     Dress  2     Shop  2       CAT Score   CAT Score  10       mMRC Score   mMRC Score  2        Pulmonary Function Assessment:   Exercise Target  Goals: Exercise Program Goal: Individual exercise prescription set using results from initial 6 min walk test and THRR while considering  patient's activity barriers and safety.   Exercise Prescription Goal: Initial exercise prescription builds to 30-45 minutes a day of aerobic activity, 2-3 days per week.  Home exercise guidelines will be given to patient during program as part of exercise prescription that the participant will acknowledge.  Activity Barriers & Risk Stratification: Activity Barriers & Cardiac Risk Stratification - 10/08/18 1504      Activity Barriers & Cardiac Risk Stratification   Activity Barriers  Shortness of Breath    Cardiac Risk Stratification  High       6 Minute Walk: 6 Minute Walk    Row Name 10/08/18 1503         6 Minute Walk   Phase  Initial     Distance  1400 feet     Walk Time  6 minutes     # of Rest Breaks  0     MPH  2.65     METS  3.03     RPE  11     Perceived Dyspnea   9     VO2 Peak  9.04     Symptoms  No     Resting HR  74 bpm     Resting BP  160/60     Resting Oxygen Saturation   96 %     Exercise Oxygen Saturation  during 6 min walk  91 %     Max Ex. HR  102 bpm     Max Ex. BP  148/70     2 Minute Post BP  124/66        Oxygen Initial Assessment: Oxygen Initial Assessment - 10/08/18 1520      Home Oxygen   Sleep Oxygen Prescription  None    Home Exercise Oxygen Prescription  --   Oxygen is prescribed for as needed at 2 L/M. Patient says she is not using at this time.    Home at Rest Exercise Oxygen Prescription  --   Oxygen is prescribed for as needed at 2 L/M. Patient says she is not using at this time.    Compliance with Home Oxygen Use  Yes  Initial 6 min Walk   Oxygen Used  None      Program Oxygen Prescription   Program Oxygen Prescription  --   As needed at 2 L/M.     Intervention   Short Term Goals  To learn and understand importance of monitoring SPO2 with pulse oximeter and demonstrate accurate use  of the pulse oximeter.;To learn and understand importance of maintaining oxygen saturations>88%;To learn and demonstrate proper pursed lip breathing techniques or other breathing techniques.;To learn and demonstrate proper use of respiratory medications;To learn and exhibit compliance with exercise, home and travel O2 prescription    Long  Term Goals  Exhibits compliance with exercise, home and travel O2 prescription;Verbalizes importance of monitoring SPO2 with pulse oximeter and return demonstration;Maintenance of O2 saturations>88%;Exhibits proper breathing techniques, such as pursed lip breathing or other method taught during program session       Oxygen Re-Evaluation: Oxygen Re-Evaluation    Row Name 11/06/18 1306 11/26/18 1555 12/24/18 1318 01/21/19 0843       Program Oxygen Prescription   Program Oxygen Prescription  None  None  None  None      Home Oxygen   Home Oxygen Device  None  None  None  None    Sleep Oxygen Prescription  None  None  None  None    Home Exercise Oxygen Prescription  None  None  None  None    Home at Rest Exercise Oxygen Prescription  None  None  None  None    Compliance with Home Oxygen Use  -  -  Yes  -      Goals/Expected Outcomes   Short Term Goals  To learn and understand importance of monitoring SPO2 with pulse oximeter and demonstrate accurate use of the pulse oximeter.;To learn and understand importance of maintaining oxygen saturations>88%;To learn and demonstrate proper pursed lip breathing techniques or other breathing techniques.;To learn and demonstrate proper use of respiratory medications;To learn and exhibit compliance with exercise, home and travel O2 prescription  To learn and understand importance of monitoring SPO2 with pulse oximeter and demonstrate accurate use of the pulse oximeter.;To learn and understand importance of maintaining oxygen saturations>88%;To learn and demonstrate proper pursed lip breathing techniques or other breathing  techniques.;To learn and demonstrate proper use of respiratory medications;To learn and exhibit compliance with exercise, home and travel O2 prescription  To learn and understand importance of monitoring SPO2 with pulse oximeter and demonstrate accurate use of the pulse oximeter.;To learn and understand importance of maintaining oxygen saturations>88%;To learn and demonstrate proper pursed lip breathing techniques or other breathing techniques.;To learn and demonstrate proper use of respiratory medications;To learn and exhibit compliance with exercise, home and travel O2 prescription  To learn and understand importance of monitoring SPO2 with pulse oximeter and demonstrate accurate use of the pulse oximeter.;To learn and understand importance of maintaining oxygen saturations>88%;To learn and demonstrate proper pursed lip breathing techniques or other breathing techniques.;To learn and demonstrate proper use of respiratory medications;To learn and exhibit compliance with exercise, home and travel O2 prescription    Long  Term Goals  Exhibits compliance with exercise, home and travel O2 prescription;Verbalizes importance of monitoring SPO2 with pulse oximeter and return demonstration;Maintenance of O2 saturations>88%;Exhibits proper breathing techniques, such as pursed lip breathing or other method taught during program session  Exhibits compliance with exercise, home and travel O2 prescription;Verbalizes importance of monitoring SPO2 with pulse oximeter and return demonstration;Maintenance of O2 saturations>88%;Exhibits proper breathing techniques, such as pursed lip breathing or  other method taught during program session  Exhibits compliance with exercise, home and travel O2 prescription;Verbalizes importance of monitoring SPO2 with pulse oximeter and return demonstration;Maintenance of O2 saturations>88%;Exhibits proper breathing techniques, such as pursed lip breathing or other method taught during program  session  Exhibits compliance with exercise, home and travel O2 prescription;Verbalizes importance of monitoring SPO2 with pulse oximeter and return demonstration;Maintenance of O2 saturations>88%;Exhibits proper breathing techniques, such as pursed lip breathing or other method taught during program session    Comments  Patient exhibitis proper demonstration of using pulse oximeter and is able to verbalize the importance of maintaining her O2 sat >88%. She is able to demonstrate proper pursed lip breathing.   Patient exhibitis proper demonstration of using pulse oximeter and is able to verbalize the importance of maintaining her O2 sat >88%. She is able to demonstrate proper pursed lip breathing.   Patient exhibitis proper demonstration of using pulse oximeter and is able to verbalize the importance of maintaining her O2 sat >88%. She is able to demonstrate proper pursed lip breathing.   Patient exhibitis proper demonstration of using pulse oximeter and is able to verbalize the importance of maintaining her O2 sat >88%. She is able to demonstrate proper pursed lip breathing.     Goals/Expected Outcomes  Patient will continue to meet her short and long term goals.   Patient will continue to meet her short and long term goals.   Patient will continue to meet her short and long term goals.   Patient will continue to meet her short and long term goals.        Oxygen Discharge (Final Oxygen Re-Evaluation): Oxygen Re-Evaluation - 01/21/19 0843      Program Oxygen Prescription   Program Oxygen Prescription  None      Home Oxygen   Home Oxygen Device  None    Sleep Oxygen Prescription  None    Home Exercise Oxygen Prescription  None    Home at Rest Exercise Oxygen Prescription  None      Goals/Expected Outcomes   Short Term Goals  To learn and understand importance of monitoring SPO2 with pulse oximeter and demonstrate accurate use of the pulse oximeter.;To learn and understand importance of maintaining  oxygen saturations>88%;To learn and demonstrate proper pursed lip breathing techniques or other breathing techniques.;To learn and demonstrate proper use of respiratory medications;To learn and exhibit compliance with exercise, home and travel O2 prescription    Long  Term Goals  Exhibits compliance with exercise, home and travel O2 prescription;Verbalizes importance of monitoring SPO2 with pulse oximeter and return demonstration;Maintenance of O2 saturations>88%;Exhibits proper breathing techniques, such as pursed lip breathing or other method taught during program session    Comments  Patient exhibitis proper demonstration of using pulse oximeter and is able to verbalize the importance of maintaining her O2 sat >88%. She is able to demonstrate proper pursed lip breathing.     Goals/Expected Outcomes  Patient will continue to meet her short and long term goals.        Initial Exercise Prescription: Initial Exercise Prescription - 10/08/18 1500      Date of Initial Exercise RX and Referring Provider   Date  10/08/18    Referring Provider  Icard    Expected Discharge Date  01/08/19      Treadmill   MPH  1.2    Grade  0    Minutes  17    METs  1.91      NuStep  Level  1    SPM  70    Minutes  17    METs  2      Prescription Details   Frequency (times per week)  2    Duration  Progress to 30 minutes of continuous aerobic without signs/symptoms of physical distress      Intensity   THRR 40-80% of Max Heartrate  [phone]    Ratings of Perceived Exertion  11-13    Perceived Dyspnea  0-4      Progression   Progression  Continue to progress workloads to maintain intensity without signs/symptoms of physical distress.      Resistance Training   Training Prescription  Yes    Weight  1    Reps  10-15       Perform Capillary Blood Glucose checks as needed.  Exercise Prescription Changes:  Exercise Prescription Changes    Row Name 10/08/18 1500 10/22/18 0800 11/05/18 1500  11/19/18 1200 12/02/18 1400     Response to Exercise   Blood Pressure (Admit)  -  144/72  184/70  160/60  180/70   Blood Pressure (Exercise)  -  164/64  190/70  180/70  190/70   Blood Pressure (Exit)  -  138/68  150/70  138/60  154/60   Heart Rate (Admit)  -  72 bpm  71 bpm  77 bpm  69 bpm   Heart Rate (Exercise)  -  80 bpm  95 bpm  91 bpm  93 bpm   Heart Rate (Exit)  -  73 bpm  84 bpm  83 bpm  74 bpm   Oxygen Saturation (Admit)  -  95 %  96 %  95 %  96 %   Oxygen Saturation (Exercise)  -  94 %  93 %  92 %  92 %   Oxygen Saturation (Exit)  -  96 %  97 %  96 %  95 %   Rating of Perceived Exertion (Exercise)  -  11  11  13  12    Perceived Dyspnea (Exercise)  -  11  13  13  13    Comments  -  First two weeks of exercise sessions   increase in overall workload  -  -   Duration  -  Progress to 30 minutes of  aerobic without signs/symptoms of physical distress  Progress to 30 minutes of  aerobic without signs/symptoms of physical distress  Continue with 30 min of aerobic exercise without signs/symptoms of physical distress.  Continue with 30 min of aerobic exercise without signs/symptoms of physical distress.   Intensity  -  THRR New [phone]  THRR unchanged  THRR unchanged  THRR unchanged     Progression   Progression  -  Continue to progress workloads to maintain intensity without signs/symptoms of physical distress.  Continue to progress workloads to maintain intensity without signs/symptoms of physical distress.  Continue to progress workloads to maintain intensity without signs/symptoms of physical distress.  Continue to progress workloads to maintain intensity without signs/symptoms of physical distress.   Average METs  -  1.76  2.14  2.24  2.3     Resistance Training   Training Prescription  -  Yes  Yes  Yes  Yes   Weight  -  1  2  2  3    Reps  -  10-15  10-15  10-15  10-15     Treadmill   MPH  -  1.2  1.8  2.2  2.5   Grade  -  0  0  0  0   Minutes  -  17  17  17  17    METs  -  1.92   2.37  2.68  2.9     NuStep   Level  -  1  2  2  2    SPM  -  70  118  122  129   Minutes  -  22  22  22  22    METs  -  1.6  1.9  1.8  1.7     Home Exercise Plan   Plans to continue exercise at  Home (comment)  Home (comment)  Home (comment)  Home (comment)  Home (comment)   Frequency  Add 3 additional days to program exercise sessions.  Add 3 additional days to program exercise sessions.  Add 3 additional days to program exercise sessions.  Add 3 additional days to program exercise sessions.  Add 3 additional days to program exercise sessions.   Initial Home Exercises Provided  10/08/18  10/08/18  10/08/18  10/08/18  10/08/18   Row Name 12/17/18 0700 12/29/18 0800 01/13/19 1500         Response to Exercise   Blood Pressure (Admit)  144/76  136/68  138/64     Blood Pressure (Exercise)  164/70  160/70  158/64     Blood Pressure (Exit)  134/74  122/68  126/70     Heart Rate (Admit)  69 bpm  67 bpm  65 bpm     Heart Rate (Exercise)  94 bpm  90 bpm  100 bpm     Heart Rate (Exit)  85 bpm  83 bpm  95 bpm     Oxygen Saturation (Admit)  97 %  95 %  95 %     Oxygen Saturation (Exercise)  93 %  93 %  92 %     Oxygen Saturation (Exit)  94 %  93 %  96 %     Rating of Perceived Exertion (Exercise)  13  11  12      Perceived Dyspnea (Exercise)  13  12  12      Comments  increase in overall workload  -  -     Duration  Continue with 30 min of aerobic exercise without signs/symptoms of physical distress.  Continue with 30 min of aerobic exercise without signs/symptoms of physical distress.  Continue with 30 min of aerobic exercise without signs/symptoms of physical distress.     Intensity  THRR unchanged  THRR unchanged  THRR unchanged       Progression   Progression  Continue to progress workloads to maintain intensity without signs/symptoms of physical distress.  Continue to progress workloads to maintain intensity without signs/symptoms of physical distress.  Continue to progress workloads to  maintain intensity without signs/symptoms of physical distress.     Average METs  2.34  2.53  4       Resistance Training   Training Prescription  Yes  Yes  Yes     Weight  3  3  4      Reps  10-15  10-15  10-15       Treadmill   MPH  2.5  2.7  2.7     Grade  0  0  0     Minutes  17  17  17      METs  2.91  3.06  3.06  NuStep   Level  2  2  - switched to XR     SPM  154  121  -     Minutes  22  22  -     METs  1.7  2  -       Recumbant Elliptical   Level  -  -  1     RPM  -  -  56     Watts  -  -  73     Minutes  -  -  17     METs  -  -  4.4       Home Exercise Plan   Plans to continue exercise at  Home (comment)  Home (comment)  Home (comment)     Frequency  Add 3 additional days to program exercise sessions.  Add 3 additional days to program exercise sessions.  Add 3 additional days to program exercise sessions.     Initial Home Exercises Provided  10/08/18  10/08/18  10/08/18        Exercise Comments:  Exercise Comments    Row Name 11/05/18 1522 11/26/18 0749 12/22/18 1026 01/20/19 1445     Exercise Comments  Pt. has done extremely well in the program. She has tolerated exercise with ease and is always eager for progressions and increases. We will continue to monitor her progress and Progress as needed.   Pt. has done great upon returning after a 2 week break for the holidays. She is up to 2.2MPH on the Treadmill and level 2 on the NuStep. She is working hard towards her goal of being able to play an entire round of golf without SOB.   Pt. is doing excellent in PR. She has attended 17 sessions. She is up to 2.7 MPH o nthe TM and level 3 on the NuStep. She states feeling stronger since starting and is ready to get back on the golf course. She has been hitting golf balls at her house and says its going well.   Pt. continues to excell in the program. She is still walking at 2.7 MPH but has added an incline. She has also switched from the NuStep to the XR and is doing great  on that.        Exercise Goals and Review:  Exercise Goals    Row Name 10/08/18 1506             Exercise Goals   Increase Physical Activity  Yes       Intervention  Provide advice, education, support and counseling about physical activity/exercise needs.;Develop an individualized exercise prescription for aerobic and resistive training based on initial evaluation findings, risk stratification, comorbidities and participant's personal goals.       Expected Outcomes  Short Term: Attend rehab on a regular basis to increase amount of physical activity.       Increase Strength and Stamina  Yes       Intervention  Provide advice, education, support and counseling about physical activity/exercise needs.;Develop an individualized exercise prescription for aerobic and resistive training based on initial evaluation findings, risk stratification, comorbidities and participant's personal goals.       Expected Outcomes  Short Term: Increase workloads from initial exercise prescription for resistance, speed, and METs.       Able to understand and use rate of perceived exertion (RPE) scale  Yes       Intervention  Provide education and explanation on how to use  RPE scale       Expected Outcomes  Short Term: Able to use RPE daily in rehab to express subjective intensity level;Long Term:  Able to use RPE to guide intensity level when exercising independently       Able to understand and use Dyspnea scale  Yes       Intervention  Provide education and explanation on how to use Dyspnea scale       Expected Outcomes  Long Term: Able to use Dyspnea scale to guide intensity level when exercising independently;Short Term: Able to use Dyspnea scale daily in rehab to express subjective sense of shortness of breath during exertion       Knowledge and understanding of Target Heart Rate Range (THRR)  Yes       Intervention  Provide education and explanation of THRR including how the numbers were predicted and where  they are located for reference       Expected Outcomes  Short Term: Able to state/look up THRR;Long Term: Able to use THRR to govern intensity when exercising independently;Short Term: Able to use daily as guideline for intensity in rehab       Able to check pulse independently  Yes       Intervention  Provide education and demonstration on how to check pulse in carotid and radial arteries.;Review the importance of being able to check your own pulse for safety during independent exercise       Expected Outcomes  Short Term: Able to explain why pulse checking is important during independent exercise;Long Term: Able to check pulse independently and accurately       Understanding of Exercise Prescription  Yes       Intervention  Provide education, explanation, and written materials on patient's individual exercise prescription       Expected Outcomes  Short Term: Able to explain program exercise prescription;Long Term: Able to explain home exercise prescription to exercise independently          Exercise Goals Re-Evaluation : Exercise Goals Re-Evaluation    Row Name 11/05/18 1520 11/26/18 0748 12/22/18 1025 01/20/19 1443       Exercise Goal Re-Evaluation   Exercise Goals Review  Increase Physical Activity;Increase Strength and Stamina;Able to understand and use rate of perceived exertion (RPE) scale;Able to understand and use Dyspnea scale;Knowledge and understanding of Target Heart Rate Range (THRR);Able to check pulse independently;Understanding of Exercise Prescription  Increase Physical Activity;Increase Strength and Stamina;Able to understand and use rate of perceived exertion (RPE) scale;Able to understand and use Dyspnea scale;Knowledge and understanding of Target Heart Rate Range (THRR);Able to check pulse independently;Understanding of Exercise Prescription  Increase Physical Activity;Increase Strength and Stamina;Able to understand and use rate of perceived exertion (RPE) scale;Able to  understand and use Dyspnea scale;Knowledge and understanding of Target Heart Rate Range (THRR);Able to check pulse independently;Understanding of Exercise Prescription  Increase Physical Activity;Increase Strength and Stamina;Able to understand and use rate of perceived exertion (RPE) scale;Able to understand and use Dyspnea scale;Knowledge and understanding of Target Heart Rate Range (THRR);Able to check pulse independently;Understanding of Exercise Prescription    Comments  Pt. has done well in the program, she has attended 8 sessions so far. She is very eager to come every day and work to increase strength and stamina.   Pt. continues to do great in the program. She was out for 2 weeks due to the holiday season but returned just as spunky and ready to work as ever. She is eager to  increase her stength and stamina. We will continue to progress her workloads as tolerated.   Pt. is doing great in the program. She works hard everyday and is very deligent about increasing her distance on the NuStep, she is slightly obsessed with how many steps per minute she pumps. We will continue to monitor and progress.   Shalise continues to do excellent in PR. She has attended 26 exercise sessions so far and each time se comes she pushes herself to work harder than the time before. She is still eager to increase her workloads as soon as possible, but always handles them with ease. She has reported feeling stronger since beginning the program and even mentioned that her husband says he can tell a difference in how she is able to move around the house.    Expected Outcomes  increase strength, imrpove ability to perform ADL's without SOB   increase strength, imrpove ability to perform ADL's without SOB   increase strength, imrpove ability to perform ADL's without SOB   increase strength, imrpove ability to perform ADL's without SOB        Discharge Exercise Prescription (Final Exercise Prescription Changes): Exercise  Prescription Changes - 01/13/19 1500      Response to Exercise   Blood Pressure (Admit)  138/64    Blood Pressure (Exercise)  158/64    Blood Pressure (Exit)  126/70    Heart Rate (Admit)  65 bpm    Heart Rate (Exercise)  100 bpm    Heart Rate (Exit)  95 bpm    Oxygen Saturation (Admit)  95 %    Oxygen Saturation (Exercise)  92 %    Oxygen Saturation (Exit)  96 %    Rating of Perceived Exertion (Exercise)  12    Perceived Dyspnea (Exercise)  12    Duration  Continue with 30 min of aerobic exercise without signs/symptoms of physical distress.    Intensity  THRR unchanged      Progression   Progression  Continue to progress workloads to maintain intensity without signs/symptoms of physical distress.    Average METs  4      Resistance Training   Training Prescription  Yes    Weight  4    Reps  10-15      Treadmill   MPH  2.7    Grade  0    Minutes  17    METs  3.06      NuStep   Level  --   switched to XR     Recumbant Elliptical   Level  1    RPM  56    Watts  73    Minutes  17    METs  4.4      Home Exercise Plan   Plans to continue exercise at  Home (comment)    Frequency  Add 3 additional days to program exercise sessions.    Initial Home Exercises Provided  10/08/18       Nutrition:  Target Goals: Understanding of nutrition guidelines, daily intake of sodium 1500mg , cholesterol 200mg , calories 30% from fat and 7% or less from saturated fats, daily to have 5 or more servings of fruits and vegetables.  Biometrics: Pre Biometrics - 10/08/18 1506      Pre Biometrics   Height  5\' 1"  (1.549 m)    Weight  53.8 kg    Waist Circumference  28 inches    Hip Circumference  34.5 inches    Waist  to Hip Ratio  0.81 %    BMI (Calculated)  22.42    Triceps Skinfold  14 mm    % Body Fat  31.7 %    Grip Strength  17.03 kg    Flexibility  0 in    Single Leg Stand  1.5 seconds        Nutrition Therapy Plan and Nutrition Goals: Nutrition Therapy & Goals -  01/21/19 0844      Nutrition Therapy   RD appointment deferred  Yes      Personal Nutrition Goals   Comments  Patient reminded of RD appointment today. She continues to say she has not changed her diet since she started the program. Will continue to monitor for progress.       Intervention Plan   Intervention  Nutrition handout(s) given to patient.       Nutrition Assessments: Nutrition Assessments - 10/08/18 1537      MEDFICTS Scores   Pre Score  32       Nutrition Goals Re-Evaluation:   Nutrition Goals Discharge (Final Nutrition Goals Re-Evaluation):   Psychosocial: Target Goals: Acknowledge presence or absence of significant depression and/or stress, maximize coping skills, provide positive support system. Participant is able to verbalize types and ability to use techniques and skills needed for reducing stress and depression.  Initial Review & Psychosocial Screening: Initial Psych Review & Screening - 10/08/18 [place]?  Yes      Barriers   Psychosocial barriers to participate in program  There are no identifiable barriers or psychosocial needs.      Screening Interventions   Interventions  Encouraged to exercise    Expected Outcomes  Long Term goal: The participant improves quality of Life and PHQ9 Scores as seen by post scores and/or verbalization of changes       Quality of Life Scores: Quality of Life - 10/08/18 1507      Quality of Life   Select  Quality of Life      Quality of Life Scores   Health/Function Pre  20.07 %    Socioeconomic Pre  28.75 %    Psych/Spiritual Pre  24 %    Family Pre  27 %    GLOBAL Pre  23.17 %      Scores of 19 and below usually indicate a poorer quality of life in these areas.  A difference of  2-3 points is a clinically meaningful difference.  A difference of 2-3 points in the total score of the Quality of Life Index has been associated with significant improvement in overall  quality of life, self-image, physical symptoms, and general health in studies assessing change in quality of life.   PHQ-9: Recent Review Flowsheet Data    Depression screen [facility] 2/9 12/09/2018 10/28/2018 10/08/2018 09/03/2018 08/25/2018   Decreased Interest 1 0 0 0 0   Down, Depressed, Hopeless 0 0 0 0 0   PHQ - 2 Score 1 0 0 0 0   Altered sleeping - - 2 - -   Tired, decreased energy - - 1 - -   Change in appetite - - 1 - -   Feeling bad or failure about yourself  - - 0 - -   Trouble concentrating - - 0 - -   Moving slowly or fidgety/restless - - 0 - -   Suicidal thoughts - - 0 - -   PHQ-9 Score - -  4 - -   Difficult doing work/chores - - Somewhat difficult - -     Interpretation of Total Score  Total Score Depression Severity:  1-4 = Minimal depression, 5-9 = Mild depression, 10-14 = Moderate depression, 15-19 = Moderately severe depression, 20-27 = Severe depression   Psychosocial Evaluation and Intervention: Psychosocial Evaluation - 10/08/18 1536      Psychosocial Evaluation & Interventions   Interventions  Encouraged to exercise with the program and follow exercise prescription;Relaxation education;Stress management education    Comments  Patient has no psychosical barriers identified at orientation. Her initial QOL score was 23.17 and her PHQ-9 score was 4.    Expected Outcomes  Patient will have no psychosocial barriers identified at [place].     Continue Psychosocial Services   No Follow up required       Psychosocial Re-Evaluation: Psychosocial Re-Evaluation    [place] Name 11/06/18 1311 11/26/18 1558 12/24/18 1323 01/21/19 0847       Psychosocial Re-Evaluation   Current issues with  None Identified  None Identified  None Identified  None Identified    Comments  Patient's initial QOL score was 23.17 and  her PHQ-9 score was 4 with no psychosocial issues identified.   Patient's initial QOL score was 23.17 and  her PHQ-9 score was 4 with no psychosocial issues identified.    Patient's initial QOL score was 23.17 and  her PHQ-9 score was 4 with no psychosocial issues identified.   Patient's initial QOL score was 23.17 and  her PHQ-9 score was 4 with no psychosocial issues identified.     Expected Outcomes  Patient will have no psychosocial issues identified at discharge.   Patient will have no psychosocial issues identified at discharge.   Patient will have no psychosocial issues identified at discharge.   Patient will have no psychosocial issues identified at discharge.     Interventions  Relaxation education;Stress management education;Encouraged to attend Pulmonary Rehabilitation for the exercise  Relaxation education;Stress management education;Encouraged to attend Pulmonary Rehabilitation for the exercise  Relaxation education;Stress management education;Encouraged to attend Pulmonary Rehabilitation for the exercise  Relaxation education;Stress management education;Encouraged to attend Pulmonary Rehabilitation for the exercise    Continue Psychosocial Services   No Follow up required  No Follow up required  No Follow up required  No Follow up required       Psychosocial Discharge (Final Psychosocial Re-Evaluation): Psychosocial Re-Evaluation - 01/21/19 0847      Psychosocial Re-Evaluation   Current issues with  None Identified    Comments  Patient's initial QOL score was 23.17 and  her PHQ-9 score was 4 with no psychosocial issues identified.     Expected Outcomes  Patient will have no psychosocial issues identified at discharge.     Interventions  Relaxation education;Stress management education;Encouraged to attend Pulmonary Rehabilitation for the exercise    Continue Psychosocial Services   No Follow up required        Education: Education Goals: Education classes will be provided on a weekly basis, covering required topics. Participant will state understanding/return demonstration of topics presented.  Learning Barriers/Preferences: Learning  Barriers/Preferences - 10/08/18 1537      Learning Barriers/Preferences   Learning Barriers  None    Learning Preferences  Written Material       Education Topics: How Lungs Work and Diseases: - Discuss the anatomy of the lungs and diseases that can affect the lungs, such as COPD.   PULMONARY REHAB OTHER RESPIRATORY from 01/14/2019 in [place]  CARDIAC REHABILITATION  Date  12/24/18  Educator  Wynetta Emery  Instruction Review Code  2- Demonstrated Understanding      Exercise: -Discuss the importance of exercise, FITT principles of exercise, normal and abnormal responses to exercise, and how to exercise safely.   Environmental Irritants: -Discuss types of environmental irritants and how to limit exposure to environmental irritants.   PULMONARY REHAB OTHER RESPIRATORY from 01/14/2019 in [place]  Date  12/31/18  Educator  Wynetta Emery  Instruction Review Code  2- Demonstrated Understanding      Meds/Inhalers and oxygen: - Discuss respiratory medications, definition of an inhaler and oxygen, and the proper way to use an inhaler and oxygen.   PULMONARY REHAB OTHER RESPIRATORY from 01/14/2019 in [place]  Date  01/07/19  Educator  Wynetta Emery      Energy Saving Techniques: - Discuss methods to conserve energy and decrease shortness of breath when performing activities of daily living.    PULMONARY REHAB OTHER RESPIRATORY from 01/14/2019 in [place]  Date  01/14/19  Educator  Wynetta Emery  Instruction Review Code  2- Demonstrated Understanding      Bronchial Hygiene / Breathing Techniques: - Discuss breathing mechanics, pursed-lip breathing technique,  proper posture, effective ways to clear airways, and other functional breathing techniques   PULMONARY REHAB OTHER RESPIRATORY from 01/14/2019 in [place]  Date  10/22/18  Educator  Wynetta Emery  Instruction Review Code  2- Demonstrated Understanding       Cleaning Equipment: - Provides group verbal and written instruction about the health risks of elevated stress, cause of high stress, and healthy ways to reduce stress.   PULMONARY REHAB OTHER RESPIRATORY from 01/14/2019 in [place]  Date  10/29/18  Educator  Wynetta Emery  Instruction Review Code  2- Demonstrated Understanding      Nutrition I: Fats: - Discuss the types of cholesterol, what cholesterol does to the body, and how cholesterol levels can be controlled.   PULMONARY REHAB OTHER RESPIRATORY from 01/14/2019 in [place]  Date  11/05/18  Educator  Wynetta Emery  Instruction Review Code  2- Demonstrated Understanding      Nutrition II: Labels: -Discuss the different components of food labels and how to read food labels.   PULMONARY REHAB OTHER RESPIRATORY from 01/14/2019 in [place]  Date  11/19/18  Educator  Wynetta Emery  Instruction Review Code  2- Demonstrated Understanding      Respiratory Infections: - Discuss the signs and symptoms of respiratory infections, ways to prevent respiratory infections, and the importance of seeking medical treatment when having a respiratory infection.   PULMONARY REHAB OTHER RESPIRATORY from 01/14/2019 in [place]  Date  11/26/18  Educator  Wynetta Emery  Instruction Review Code  2- Demonstrated Understanding      Stress I: Signs and Symptoms: - Discuss the causes of stress, how stress may lead to anxiety and depression, and ways to limit stress.   PULMONARY REHAB OTHER RESPIRATORY from 01/14/2019 in [place]  Date  12/03/18  Educator  MB  Instruction Review Code  2- Demonstrated Understanding      Stress II: Relaxation: -Discuss relaxation techniques to limit stress.   PULMONARY REHAB OTHER RESPIRATORY from 01/14/2019 in [place]  Date  12/10/18  Educator  Wynetta Emery  Instruction Review Code  2-  Demonstrated Understanding      Oxygen for Home/Travel: - Discuss how to prepare for travel when  on oxygen and proper ways to transport and store oxygen to ensure safety.   PULMONARY REHAB OTHER RESPIRATORY from 01/14/2019 in [place]  Date  12/17/18  Educator  DJ  Instruction Review Code  2- Demonstrated Understanding      Knowledge Questionnaire Score: Knowledge Questionnaire Score - 10/08/18 1538      Knowledge Questionnaire Score   Pre Score  14/18       Core Components/Risk Factors/Patient Goals at Admission: Personal Goals and Risk Factors at Admission - 10/08/18 1538      Core Components/Risk Factors/Patient Goals on Admission    Weight Management  Weight Maintenance    Improve shortness of breath with ADL's  Yes    Intervention  Provide education, individualized exercise plan and daily activity instruction to help decrease symptoms of SOB with activities of daily living.    Expected Outcomes  Short Term: Improve cardiorespiratory fitness to achieve a reduction of symptoms when performing ADLs;Long Term: Be able to perform more ADLs without symptoms or delay the onset of symptoms    Hypertension  Yes    Intervention  Provide education on lifestyle modifcations including regular physical activity/exercise, weight management, moderate sodium restriction and increased consumption of fresh fruit, vegetables, and low fat dairy, alcohol moderation, and smoking cessation.;Monitor prescription use compliance.    Expected Outcomes  Short Term: Continued assessment and intervention until BP is < 140/56mm HG in hypertensive participants. < 130/77mm HG in hypertensive participants with diabetes, heart failure or chronic kidney disease.;Long Term: Maintenance of blood pressure at goal levels.    Personal Goal Other  Yes    Personal Goal  Breathe better. Has less SOB with activities. Gain strength and stamina. Get back to playing golf.     Intervention  Patient will  attend PR 2 days/week and supplement with exercise 3 days/week.     Expected Outcomes  Patient will complete the program and meet her personal goals.        Core Components/Risk Factors/Patient Goals Review:  Goals and Risk Factor Review    Row Name 11/06/18 1309 11/26/18 1556 12/24/18 1320 01/21/19 0844       Core Components/Risk Factors/Patient Goals Review   Personal Goals Review  Weight Management/Obesity;Hypertension;Improve shortness of breath with ADL's Breathe better; gain strength; do activities w/o SOB; golf again.   Weight Management/Obesity;Hypertension;Improve shortness of breath with ADL's Breathe better; gain strength; do activities w/o SOB; golf again.   Weight Management/Obesity;Hypertension;Improve shortness of breath with ADL's Breathe better; gain strength; do activities w/o SOB; golf again.   Weight Management/Obesity;Hypertension;Improve shortness of breath with ADL's Breathe better; gain strength; do activities w/o SOB; play golf again.    Review  Patient has completed 8 sessions losing 1.5 lbs since her initial visit. She is doing well in the program with progression. She says she does feel stronger and has more energy. She feels like she will be ready to golf in the Spring if she continues the program. Her breathing has improved. Will continue to monitor for progress.   Patient has completed 11 sessions maintaining her weight since last 30 day review. She continues to do well in the program with progression. She says her energy continues to improve and she is breathing better. She feels like she will be ready to golf soon. Will continue to monitor for progress.   Patient has completed 19 session gaining 1 lb since last 30 day review. Patient states the program has given me a good emotional aura  with improved stamina and breathing. She feels the program and really benefited her. She is ready to play golf in the Spring. She also states she has learned a lot in the education  sessions. Will continue to monitor for progress.   Patient has completed 26 sessions gaining 2 lbs since last 30 day review. She continues to do well in the program with progression. She states she is able to walk her basement stairs without SOB or fatigue. She feels stronger and is breathing better. She is ready to play golf. She feels the program has helped her acheive her personal goals. He blood pressue is better controlled now since her medications have been adjusted. Will continue to monitor for progress.     Expected Outcomes  Patient will continue to attend sessions and complete the program meeting her personal goals.   Patient will continue to attend sessions and complete the program meeting her personal goals.   Patient will continue to attend sessions and complete the program meeting her personal goals.   Patient will continue to attend sessions and complete the program meeting her personal goals.        Core Components/Risk Factors/Patient Goals at Discharge (Final Review):  Goals and Risk Factor Review - 01/21/19 0844      Core Components/Risk Factors/Patient Goals Review   Personal Goals Review  Weight Management/Obesity;Hypertension;Improve shortness of breath with ADL's   Breathe better; gain strength; do activities w/o SOB; play golf again.   Review  Patient has completed 26 sessions gaining 2 lbs since last 30 day review. She continues to do well in the program with progression. She states she is able to walk her basement stairs without SOB or fatigue. She feels stronger and is breathing better. She is ready to play golf. She feels the program has helped her acheive her personal goals. He blood pressue is better controlled now since her medications have been adjusted. Will continue to monitor for progress.     Expected Outcomes  Patient will continue to attend sessions and complete the program meeting her personal goals.        ITP Comments: ITP Comments    Row Name 10/19/18 1450            ITP Comments  Patient new to program. She has completed 2 sessions. Will continue to monitor for progress.           Comments: ITP REVIEW Pt is making expected progress toward pulmonary rehab goals after completing 26 sessions. Recommend continued exercise, life style modification, education, and utilization of breathing techniques to increase stamina and strength and decrease shortness of breath with exertion.

## 2019-01-21 NOTE — Progress Notes (Signed)
Pulmonary Individual Treatment Plan  Patient Details  Name: Susan Fitzgerald MRN: [id number] Date of Birth: 1932-07-21 Referring Provider:     PULMONARY REHAB OTHER RESP ORIENTATION from 10/08/2018 in [place]  Referring Provider  Icard      Initial Encounter Date:    PULMONARY REHAB OTHER RESP ORIENTATION from 10/08/2018 in [facility]  Date  10/08/18      Visit Diagnosis: ILD (interstitial lung disease) ([place])  Patient's Home Medications on Admission:   Current Outpatient Medications:  .  apixaban (ELIQUIS) 2.5 MG TABS tablet, Take 2.5 mg by mouth 2 (two) times daily. , Disp: , Rfl:  .  calcium-vitamin D (OSCAL WITH D) 250-125 MG-UNIT per tablet, Take 1 tablet by mouth 2 (two) times daily., Disp: , Rfl:  .  Cyanocobalamin (VITAMIN B-12 IJ), Inject once a month, Disp: , Rfl:  .  diltiazem (DILACOR XR) 180 MG 24 hr capsule, Take 180 mg by mouth daily., Disp: , Rfl:  .  folic acid (FOLVITE) 1 MG tablet, Take 2 mg by mouth daily. , Disp: , Rfl:  .  furosemide (LASIX) 20 MG tablet, Take 1 tablet (20 mg total) by mouth daily., Disp: 180 tablet, Rfl: 1 .  gabapentin (NEURONTIN) 100 MG capsule, Take 100 mg by mouth at bedtime. , Disp: , Rfl:  .  levothyroxine (SYNTHROID, LEVOTHROID) 75 MCG tablet, TAKE 1 TABLET BY MOUTH  DAILY, Disp: 90 tablet, Rfl: 3 .  lisinopril (PRINIVIL,ZESTRIL) 20 MG tablet, Take 1 tablet (20 mg total) by mouth daily., Disp: 90 tablet, Rfl: 3 .  lisinopril-hydrochlorothiazide (PRINZIDE,ZESTORETIC) 20-12.5 MG tablet, Take 1 tablet by mouth daily., Disp: 90 tablet, Rfl: 3 .  Melatonin 5 MG TABS, Take 5 mg by mouth at bedtime., Disp: , Rfl:  .  methotrexate (RHEUMATREX) 2.5 MG tablet, Take 10 mg by mouth once a week. Caution:Chemotherapy. Protect from light., Disp: , Rfl:  .  Multiple Vitamin (MULTIVITAMIN) capsule, Take 1 capsule by mouth daily., Disp: , Rfl:  .  potassium chloride SA (KLOR-CON M20) 20 MEQ tablet, Take 1  tablet (20 mEq total) by mouth daily., Disp: 90 tablet, Rfl: 1  Current Facility-Administered Medications:  .  cyanocobalamin ((VITAMIN B-12)) injection 1,000 mcg, 1,000 mcg, Intramuscular, Q30 days, Chipper Herb, MD, 1,000 mcg at 01/11/19 1427  Past Medical History: Past Medical History:  Diagnosis Date  . Arthritis   . Bradycardia 2014  . Calcification of bronchial airway 06/23/2018   01/2017 - See on CT imaging of the chest   . Cataract   . Cerebral vascular disease 06/19/2018   06/19/2018 - MRI with chronic microvascular ischemic change  . Diverticulosis   . Dyspnea    periodically  . Dysrhythmia    a - fib  . Heart murmur    ??? bruit  . History of stress test    a. 04/09/13-nuclear stress-no ischemia low risk  . Hypertension   . Menopause   . Osteoporosis   . PAF (paroxysmal atrial fibrillation) ([place])    a. s/p prior DCCV; b. On flecainide & coumadin (CHA2DS2VASc = 4);  c. 03/2016 Echo: EF 55-60%, mod LVH, mod AI, mild to mod TR, PASP 61mHg.  .Marland KitchenPelvic fracture ([facility])     Tobacco Use: Social History   Tobacco Use  Smoking Status Never Smoker  Smokeless Tobacco Never Used    Labs: Recent Review [facility], physiological   Labs for ITP Cardiac and Pulmonary Rehab Latest Ref Rng & Units 09/16/2016  03/24/2017 09/15/2017 12/04/2018 12/09/2018   Cholestrol 100 - 199 mg/dL 149 169 166 - 176   LDLCALC 0 - 99 mg/dL - - - - 98   LDLDIRECT 0 - 99 mg/dL - - - - -   HDL >39 mg/dL 65 77 71 - 63   Trlycerides 0 - 149 mg/dL 73 55 67 - 74   Hemoglobin A1c <7.0 % - - - 5.0 -      Capillary Blood Glucose: No results found for: GLUCAP   Pulmonary Assessment Scores: Pulmonary Assessment Scores    Row Name 10/08/18 1532 01/21/19 1522       ADL UCSD   ADL Phase  Entry  Exit    SOB Score total  28  13    Rest  0  0    Walk  5  1    Stairs  3  1    Bath  2  1    Dress  2  1    Shop  2  1      CAT Score   CAT Score  10  9      mMRC Score   mMRC Score  2  1       Pulmonary  Function Assessment:   Exercise Target Goals: Exercise Program Goal: Individual exercise prescription set using results from initial 6 min walk test and THRR while considering  patient's activity barriers and safety.   Exercise Prescription Goal: Initial exercise prescription builds to 30-45 minutes a day of aerobic activity, 2-3 days per week.  Home exercise guidelines will be given to patient during program as part of exercise prescription that the participant will acknowledge.  Activity Barriers & Risk Stratification: Activity Barriers & Cardiac Risk Stratification - 10/08/18 1504      Activity Barriers & Cardiac Risk Stratification   Activity Barriers  Shortness of Breath    Cardiac Risk Stratification  High       6 Minute Walk: 6 Minute Walk    Row Name 10/08/18 1503 01/21/19 1259       6 Minute Walk   Phase  Initial  Discharge    Distance  1400 feet  1600 feet    Distance % Change  -  14.2 %    Distance Feet Change  -  200 ft    Walk Time  6 minutes  6 minutes    # of Rest Breaks  0  0    MPH  2.65  3.03    METS  3.03  3.32    RPE  11  12    Perceived Dyspnea   9  8    VO2 Peak  9.04  11.3    Symptoms  No  -    Resting HR  74 bpm  73 bpm    Resting BP  160/60  170/66    Resting Oxygen Saturation   96 %  96 %    Exercise Oxygen Saturation  during 6 min walk  91 %  92 %    Max Ex. HR  102 bpm  98 bpm    Max Ex. BP  148/70  180/66    2 Minute Post BP  124/66  164/72       Oxygen Initial Assessment: Oxygen Initial Assessment - 10/08/18 1520      Home Oxygen   Sleep Oxygen Prescription  None    Home Exercise Oxygen Prescription  --   Oxygen is prescribed for  as needed at 2 L/M. Patient says she is not using at this time.    Home at Rest Exercise Oxygen Prescription  --   Oxygen is prescribed for as needed at 2 L/M. Patient says she is not using at this time.    Compliance with Home Oxygen Use  Yes      Initial 6 min Walk   Oxygen Used  None      Program  Oxygen Prescription   Program Oxygen Prescription  --   As needed at 2 L/M.     Intervention   Short Term Goals  To learn and understand importance of monitoring SPO2 with pulse oximeter and demonstrate accurate use of the pulse oximeter.;To learn and understand importance of maintaining oxygen saturations>88%;To learn and demonstrate proper pursed lip breathing techniques or other breathing techniques.;To learn and demonstrate proper use of respiratory medications;To learn and exhibit compliance with exercise, home and travel O2 prescription    Long  Term Goals  Exhibits compliance with exercise, home and travel O2 prescription;Verbalizes importance of monitoring SPO2 with pulse oximeter and return demonstration;Maintenance of O2 saturations>88%;Exhibits proper breathing techniques, such as pursed lip breathing or other method taught during program session       Oxygen Re-Evaluation: Oxygen Re-Evaluation    Row Name 11/06/18 1306 11/26/18 1555 12/24/18 1318 01/21/19 0843       Program Oxygen Prescription   Program Oxygen Prescription  None  None  None  None      Home Oxygen   Home Oxygen Device  None  None  None  None    Sleep Oxygen Prescription  None  None  None  None    Home Exercise Oxygen Prescription  None  None  None  None    Home at Rest Exercise Oxygen Prescription  None  None  None  None    Compliance with Home Oxygen Use  -  -  Yes  -      Goals/Expected Outcomes   Short Term Goals  To learn and understand importance of monitoring SPO2 with pulse oximeter and demonstrate accurate use of the pulse oximeter.;To learn and understand importance of maintaining oxygen saturations>88%;To learn and demonstrate proper pursed lip breathing techniques or other breathing techniques.;To learn and demonstrate proper use of respiratory medications;To learn and exhibit compliance with exercise, home and travel O2 prescription  To learn and understand importance of monitoring SPO2 with pulse  oximeter and demonstrate accurate use of the pulse oximeter.;To learn and understand importance of maintaining oxygen saturations>88%;To learn and demonstrate proper pursed lip breathing techniques or other breathing techniques.;To learn and demonstrate proper use of respiratory medications;To learn and exhibit compliance with exercise, home and travel O2 prescription  To learn and understand importance of monitoring SPO2 with pulse oximeter and demonstrate accurate use of the pulse oximeter.;To learn and understand importance of maintaining oxygen saturations>88%;To learn and demonstrate proper pursed lip breathing techniques or other breathing techniques.;To learn and demonstrate proper use of respiratory medications;To learn and exhibit compliance with exercise, home and travel O2 prescription  To learn and understand importance of monitoring SPO2 with pulse oximeter and demonstrate accurate use of the pulse oximeter.;To learn and understand importance of maintaining oxygen saturations>88%;To learn and demonstrate proper pursed lip breathing techniques or other breathing techniques.;To learn and demonstrate proper use of respiratory medications;To learn and exhibit compliance with exercise, home and travel O2 prescription    Long  Term Goals  Exhibits compliance with exercise, home and travel O2 prescription;Verbalizes  importance of monitoring SPO2 with pulse oximeter and return demonstration;Maintenance of O2 saturations>88%;Exhibits proper breathing techniques, such as pursed lip breathing or other method taught during program session  Exhibits compliance with exercise, home and travel O2 prescription;Verbalizes importance of monitoring SPO2 with pulse oximeter and return demonstration;Maintenance of O2 saturations>88%;Exhibits proper breathing techniques, such as pursed lip breathing or other method taught during program session  Exhibits compliance with exercise, home and travel O2  prescription;Verbalizes importance of monitoring SPO2 with pulse oximeter and return demonstration;Maintenance of O2 saturations>88%;Exhibits proper breathing techniques, such as pursed lip breathing or other method taught during program session  Exhibits compliance with exercise, home and travel O2 prescription;Verbalizes importance of monitoring SPO2 with pulse oximeter and return demonstration;Maintenance of O2 saturations>88%;Exhibits proper breathing techniques, such as pursed lip breathing or other method taught during program session    Comments  Patient exhibitis proper demonstration of using pulse oximeter and is able to verbalize the importance of maintaining her O2 sat >88%. She is able to demonstrate proper pursed lip breathing.   Patient exhibitis proper demonstration of using pulse oximeter and is able to verbalize the importance of maintaining her O2 sat >88%. She is able to demonstrate proper pursed lip breathing.   Patient exhibitis proper demonstration of using pulse oximeter and is able to verbalize the importance of maintaining her O2 sat >88%. She is able to demonstrate proper pursed lip breathing.   Patient exhibitis proper demonstration of using pulse oximeter and is able to verbalize the importance of maintaining her O2 sat >88%. She is able to demonstrate proper pursed lip breathing.     Goals/Expected Outcomes  Patient will continue to meet her short and long term goals.   Patient will continue to meet her short and long term goals.   Patient will continue to meet her short and long term goals.   Patient will continue to meet her short and long term goals.        Oxygen Discharge (Final Oxygen Re-Evaluation): Oxygen Re-Evaluation - 01/21/19 0843      Program Oxygen Prescription   Program Oxygen Prescription  None      Home Oxygen   Home Oxygen Device  None    Sleep Oxygen Prescription  None    Home Exercise Oxygen Prescription  None    Home at Rest Exercise Oxygen  Prescription  None      Goals/Expected Outcomes   Short Term Goals  To learn and understand importance of monitoring SPO2 with pulse oximeter and demonstrate accurate use of the pulse oximeter.;To learn and understand importance of maintaining oxygen saturations>88%;To learn and demonstrate proper pursed lip breathing techniques or other breathing techniques.;To learn and demonstrate proper use of respiratory medications;To learn and exhibit compliance with exercise, home and travel O2 prescription    Long  Term Goals  Exhibits compliance with exercise, home and travel O2 prescription;Verbalizes importance of monitoring SPO2 with pulse oximeter and return demonstration;Maintenance of O2 saturations>88%;Exhibits proper breathing techniques, such as pursed lip breathing or other method taught during program session    Comments  Patient exhibitis proper demonstration of using pulse oximeter and is able to verbalize the importance of maintaining her O2 sat >88%. She is able to demonstrate proper pursed lip breathing.     Goals/Expected Outcomes  Patient will continue to meet her short and long term goals.        Initial Exercise Prescription: Initial Exercise Prescription - 10/08/18 1500      Date of Initial Exercise  RX and Referring Provider   Date  10/08/18    Referring Provider  Icard    Expected Discharge Date  01/08/19      Treadmill   MPH  1.2    Grade  0    Minutes  17    METs  1.91      NuStep   Level  1    SPM  70    Minutes  17    METs  2      Prescription Details   Frequency (times per week)  2    Duration  Progress to 30 minutes of continuous aerobic without signs/symptoms of physical distress      Intensity   THRR 40-80% of Max Heartrate  [phone]    Ratings of Perceived Exertion  11-13    Perceived Dyspnea  0-4      Progression   Progression  Continue to progress workloads to maintain intensity without signs/symptoms of physical distress.      Resistance  Training   Training Prescription  Yes    Weight  1    Reps  10-15       Perform Capillary Blood Glucose checks as needed.  Exercise Prescription Changes:  Exercise Prescription Changes    Row Name 10/08/18 1500 10/22/18 0800 11/05/18 1500 11/19/18 1200 12/02/18 1400     Response to Exercise   Blood Pressure (Admit)  -  144/72  184/70  160/60  180/70   Blood Pressure (Exercise)  -  164/64  190/70  180/70  190/70   Blood Pressure (Exit)  -  138/68  150/70  138/60  154/60   Heart Rate (Admit)  -  72 bpm  71 bpm  77 bpm  69 bpm   Heart Rate (Exercise)  -  80 bpm  95 bpm  91 bpm  93 bpm   Heart Rate (Exit)  -  73 bpm  84 bpm  83 bpm  74 bpm   Oxygen Saturation (Admit)  -  95 %  96 %  95 %  96 %   Oxygen Saturation (Exercise)  -  94 %  93 %  92 %  92 %   Oxygen Saturation (Exit)  -  96 %  97 %  96 %  95 %   Rating of Perceived Exertion (Exercise)  -  '11  11  13  12   ' Perceived Dyspnea (Exercise)  -  '11  13  13  13   ' Comments  -  First two weeks of exercise sessions   increase in overall workload  -  -   Duration  -  Progress to 30 minutes of  aerobic without signs/symptoms of physical distress  Progress to 30 minutes of  aerobic without signs/symptoms of physical distress  Continue with 30 min of aerobic exercise without signs/symptoms of physical distress.  Continue with 30 min of aerobic exercise without signs/symptoms of physical distress.   Intensity  -  THRR New [phone]  THRR unchanged  THRR unchanged  THRR unchanged     Progression   Progression  -  Continue to progress workloads to maintain intensity without signs/symptoms of physical distress.  Continue to progress workloads to maintain intensity without signs/symptoms of physical distress.  Continue to progress workloads to maintain intensity without signs/symptoms of physical distress.  Continue to progress workloads to maintain intensity without signs/symptoms of physical distress.   Average METs  -  1.76  2.14  2.24  2.3  Resistance Training   Training Prescription  -  Yes  Yes  Yes  Yes   Weight  -  '1  2  2  3   ' Reps  -  10-15  10-15  10-15  10-15     Treadmill   MPH  -  1.2  1.8  2.2  2.5   Grade  -  0  0  0  0   Minutes  -  '17  17  17  17   ' METs  -  1.92  2.37  2.68  2.9     NuStep   Level  -  '1  2  2  2   ' SPM  -  70  118  122  129   Minutes  -  '22  22  22  22   ' METs  -  1.6  1.9  1.8  1.7     Home Exercise Plan   Plans to continue exercise at  Home (comment)  Home (comment)  Home (comment)  Home (comment)  Home (comment)   Frequency  Add 3 additional days to program exercise sessions.  Add 3 additional days to program exercise sessions.  Add 3 additional days to program exercise sessions.  Add 3 additional days to program exercise sessions.  Add 3 additional days to program exercise sessions.   Initial Home Exercises Provided  10/08/18  10/08/18  10/08/18  10/08/18  10/08/18   Row Name 12/17/18 0700 12/29/18 0800 01/13/19 1500         Response to Exercise   Blood Pressure (Admit)  144/76  136/68  138/64     Blood Pressure (Exercise)  164/70  160/70  158/64     Blood Pressure (Exit)  134/74  122/68  126/70     Heart Rate (Admit)  69 bpm  67 bpm  65 bpm     Heart Rate (Exercise)  94 bpm  90 bpm  100 bpm     Heart Rate (Exit)  85 bpm  83 bpm  95 bpm     Oxygen Saturation (Admit)  97 %  95 %  95 %     Oxygen Saturation (Exercise)  93 %  93 %  92 %     Oxygen Saturation (Exit)  94 %  93 %  96 %     Rating of Perceived Exertion (Exercise)  '13  11  12     ' Perceived Dyspnea (Exercise)  '13  12  12     ' Comments  increase in overall workload  -  -     Duration  Continue with 30 min of aerobic exercise without signs/symptoms of physical distress.  Continue with 30 min of aerobic exercise without signs/symptoms of physical distress.  Continue with 30 min of aerobic exercise without signs/symptoms of physical distress.     Intensity  THRR unchanged  THRR unchanged  THRR unchanged       Progression    Progression  Continue to progress workloads to maintain intensity without signs/symptoms of physical distress.  Continue to progress workloads to maintain intensity without signs/symptoms of physical distress.  Continue to progress workloads to maintain intensity without signs/symptoms of physical distress.     Average METs  2.34  2.53  4       Resistance Training   Training Prescription  Yes  Yes  Yes     Weight  '3  3  4     ' Reps  10-15  10-15  10-15       Treadmill   MPH  2.5  2.7  2.7     Grade  0  0  0     Minutes  '17  17  17     ' METs  2.91  3.06  3.06       NuStep   Level  2  2  - switched to XR     SPM  154  121  -     Minutes  22  22  -     METs  1.7  2  -       Recumbant Elliptical   Level  -  -  1     RPM  -  -  56     Watts  -  -  73     Minutes  -  -  17     METs  -  -  4.4       Home Exercise Plan   Plans to continue exercise at  Home (comment)  Home (comment)  Home (comment)     Frequency  Add 3 additional days to program exercise sessions.  Add 3 additional days to program exercise sessions.  Add 3 additional days to program exercise sessions.     Initial Home Exercises Provided  10/08/18  10/08/18  10/08/18        Exercise Comments:  Exercise Comments    Row Name 11/05/18 1522 11/26/18 0749 12/22/18 1026 01/20/19 1445 01/21/19 1305   Exercise Comments  Pt. has done extremely well in the program. She has tolerated exercise with ease and is always eager for progressions and increases. We will continue to monitor her progress and Progress as needed.   Pt. has done great upon returning after a 2 week break for the holidays. She is up to 2.2MPH on the Treadmill and level 2 on the NuStep. She is working hard towards her goal of being able to play an entire round of golf without SOB.   Pt. is doing excellent in PR. She has attended 17 sessions. She is up to 2.7 MPH o nthe TM and level 3 on the NuStep. She states feeling stronger since starting and is ready to get back on  the golf course. She has been hitting golf balls at her house and says its going well.   Pt. continues to excell in the program. She is still walking at 2.7 MPH but has added an incline. She has also switched from the NuStep to the XR and is doing great on that.    Susan Fitzgerald graduated today from  rehab with 27 sessions completed.  Details of the patient's exercise prescription and what She needs to do in order to continue the prescription and progress were discussed with patient.  Patient was given a copy of prescription and goals.  Patient verbalized understanding.  Susan Fitzgerald plans to continue to exercise by walking and playing golf.      Exercise Goals and Review:  Exercise Goals    Row Name 10/08/18 1506             Exercise Goals   Increase Physical Activity  Yes       Intervention  Provide advice, education, support and counseling about physical activity/exercise needs.;Develop an individualized exercise prescription for aerobic and resistive training based on initial evaluation findings, risk stratification, comorbidities and participant's personal goals.       Expected Outcomes  Short Term: Attend rehab on a regular basis to increase amount of physical activity.       Increase Strength and Stamina  Yes       Intervention  Provide advice, education, support and counseling about physical activity/exercise needs.;Develop an individualized exercise prescription for aerobic and resistive training based on initial evaluation findings, risk stratification, comorbidities and participant's personal goals.       Expected Outcomes  Short Term: Increase workloads from initial exercise prescription for resistance, speed, and METs.       Able to understand and use rate of perceived exertion (RPE) scale  Yes       Intervention  Provide education and explanation on how to use RPE scale       Expected Outcomes  Short Term: Able to use RPE daily in rehab to express subjective intensity level;Long Term:  Able to  use RPE to guide intensity level when exercising independently       Able to understand and use Dyspnea scale  Yes       Intervention  Provide education and explanation on how to use Dyspnea scale       Expected Outcomes  Long Term: Able to use Dyspnea scale to guide intensity level when exercising independently;Short Term: Able to use Dyspnea scale daily in rehab to express subjective sense of shortness of breath during exertion       Knowledge and understanding of Target Heart Rate Range (THRR)  Yes       Intervention  Provide education and explanation of THRR including how the numbers were predicted and where they are located for reference       Expected Outcomes  Short Term: Able to state/look up THRR;Long Term: Able to use THRR to govern intensity when exercising independently;Short Term: Able to use daily as guideline for intensity in rehab       Able to check pulse independently  Yes       Intervention  Provide education and demonstration on how to check pulse in carotid and radial arteries.;Review the importance of being able to check your own pulse for safety during independent exercise       Expected Outcomes  Short Term: Able to explain why pulse checking is important during independent exercise;Long Term: Able to check pulse independently and accurately       Understanding of Exercise Prescription  Yes       Intervention  Provide education, explanation, and written materials on patient's individual exercise prescription       Expected Outcomes  Short Term: Able to explain program exercise prescription;Long Term: Able to explain home exercise prescription to exercise independently          Exercise Goals Re-Evaluation : Exercise Goals Re-Evaluation    Row Name 11/05/18 1520 11/26/18 0748 12/22/18 1025 01/20/19 1443       Exercise Goal Re-Evaluation   Exercise Goals Review  Increase Physical Activity;Increase Strength and Stamina;Able to understand and use rate of perceived exertion  (RPE) scale;Able to understand and use Dyspnea scale;Knowledge and understanding of Target Heart Rate Range (THRR);Able to check pulse independently;Understanding of Exercise Prescription  Increase Physical Activity;Increase Strength and Stamina;Able to understand and use rate of perceived exertion (RPE) scale;Able to understand and use Dyspnea scale;Knowledge and understanding of Target Heart Rate Range (THRR);Able to check pulse independently;Understanding of Exercise Prescription  Increase Physical Activity;Increase Strength and Stamina;Able to understand and use rate of perceived exertion (RPE) scale;Able to understand and use Dyspnea scale;Knowledge and  understanding of Target Heart Rate Range (THRR);Able to check pulse independently;Understanding of Exercise Prescription  Increase Physical Activity;Increase Strength and Stamina;Able to understand and use rate of perceived exertion (RPE) scale;Able to understand and use Dyspnea scale;Knowledge and understanding of Target Heart Rate Range (THRR);Able to check pulse independently;Understanding of Exercise Prescription    Comments  Pt. has done well in the program, she has attended 8 sessions so far. She is very eager to come every day and work to increase strength and stamina.   Pt. continues to do great in the program. She was out for 2 weeks due to the holiday season but returned just as spunky and ready to work as ever. She is eager to increase her stength and stamina. We will continue to progress her workloads as tolerated.   Pt. is doing great in the program. She works hard everyday and is very deligent about increasing her distance on the NuStep, she is slightly obsessed with how many steps per minute she pumps. We will continue to monitor and progress.   Susan Fitzgerald continues to do excellent in PR. She has attended 26 exercise sessions so far and each time se comes she pushes herself to work harder than the time before. She is still eager to increase her  workloads as soon as possible, but always handles them with ease. She has reported feeling stronger since beginning the program and even mentioned that her husband says he can tell a difference in how she is able to move around the house.    Expected Outcomes  increase strength, imrpove ability to perform ADL's without SOB   increase strength, imrpove ability to perform ADL's without SOB   increase strength, imrpove ability to perform ADL's without SOB   increase strength, imrpove ability to perform ADL's without SOB        Discharge Exercise Prescription (Final Exercise Prescription Changes): Exercise Prescription Changes - 01/13/19 1500      Response to Exercise   Blood Pressure (Admit)  138/64    Blood Pressure (Exercise)  158/64    Blood Pressure (Exit)  126/70    Heart Rate (Admit)  65 bpm    Heart Rate (Exercise)  100 bpm    Heart Rate (Exit)  95 bpm    Oxygen Saturation (Admit)  95 %    Oxygen Saturation (Exercise)  92 %    Oxygen Saturation (Exit)  96 %    Rating of Perceived Exertion (Exercise)  12    Perceived Dyspnea (Exercise)  12    Duration  Continue with 30 min of aerobic exercise without signs/symptoms of physical distress.    Intensity  THRR unchanged      Progression   Progression  Continue to progress workloads to maintain intensity without signs/symptoms of physical distress.    Average METs  4      Resistance Training   Training Prescription  Yes    Weight  4    Reps  10-15      Treadmill   MPH  2.7    Grade  0    Minutes  17    METs  3.06      NuStep   Level  --   switched to XR     Recumbant Elliptical   Level  1    RPM  56    Watts  73    Minutes  17    METs  4.4      Home Exercise Plan  Plans to continue exercise at  Home (comment)    Frequency  Add 3 additional days to program exercise sessions.    Initial Home Exercises Provided  10/08/18       Nutrition:  Target Goals: Understanding of nutrition guidelines, daily intake of sodium  <1552m, cholesterol <2072m calories 30% from fat and 7% or less from saturated fats, daily to have 5 or more servings of fruits and vegetables.  Biometrics: Pre Biometrics - 10/08/18 1506      Pre Biometrics   Height  '5\' 1"'  (1.549 m)    Weight  53.8 kg    Waist Circumference  28 inches    Hip Circumference  34.5 inches    Waist to Hip Ratio  0.81 %    BMI (Calculated)  22.42    Triceps Skinfold  14 mm    % Body Fat  31.7 %    Grip Strength  17.03 kg    Flexibility  0 in    Single Leg Stand  1.5 seconds      Post Biometrics - 01/21/19 1301       Post  Biometrics   Height  4' 11.5" (1.511 m)    Weight  55 kg    Waist Circumference  27.5 inches    Hip Circumference  35 inches    Waist to Hip Ratio  0.79 %    BMI (Calculated)  24.09    Triceps Skinfold  12 mm    % Body Fat  30.9 %    Grip Strength  18.2 kg    Flexibility  0 in    Single Leg Stand  5.5 seconds       Nutrition Therapy Plan and Nutrition Goals: Nutrition Therapy & Goals - 01/21/19 0844      Nutrition Therapy   RD appointment deferred  Yes      Personal Nutrition Goals   Comments  Patient reminded of RD appointment today. She continues to say she has not changed her diet since she started the program. Will continue to monitor for progress.       Intervention Plan   Intervention  Nutrition handout(s) given to patient.       Nutrition Assessments: Nutrition Assessments - 01/21/19 1523      MEDFICTS Scores   Pre Score  32    Post Score  30    Score Difference  -2       Nutrition Goals Re-Evaluation:   Nutrition Goals Discharge (Final Nutrition Goals Re-Evaluation):   Psychosocial: Target Goals: Acknowledge presence or absence of significant depression and/or stress, maximize coping skills, provide positive support system. Participant is able to verbalize types and ability to use techniques and skills needed for reducing stress and depression.  Initial Review & Psychosocial  Screening: Initial Psych Review & Screening - 10/08/18 [place] Yes      Barriers   Psychosocial barriers to participate in program  There are no identifiable barriers or psychosocial needs.      Screening Interventions   Interventions  Encouraged to exercise    Expected Outcomes  Long Term goal: The participant improves quality of Life and PHQ9 Scores as seen by post scores and/or verbalization of changes       Quality of Life Scores: Quality of Life - 01/21/19 1302      Quality of Life   Select  Quality of Life  Quality of Life Scores   Health/Function Pre  20.07 %    Health/Function Post  25.64 %    Health/Function % Change  27.75 %    Socioeconomic Pre  28.75 %    Socioeconomic Post  28 %    Socioeconomic % Change   -2.61 %    Psych/Spiritual Pre  24 %    Psych/Spiritual Post  25.5 %    Psych/Spiritual % Change  6.25 %    Family Pre  27 %    Family Post  28.8 %    Family % Change  6.67 %    GLOBAL Pre  23.17 %    GLOBAL Post  26.55 %    GLOBAL % Change  14.59 %      Scores of 19 and below usually indicate a poorer quality of life in these areas.  A difference of  2-3 points is a clinically meaningful difference.  A difference of 2-3 points in the total score of the Quality of Life Index has been associated with significant improvement in overall quality of life, self-image, physical symptoms, and general health in studies assessing change in quality of life.   PHQ-9: Recent Review Flowsheet Data    Depression screen [facility] 2/9 01/21/2019 12/09/2018 10/28/2018 10/08/2018 09/03/2018   Decreased Interest 0 1 0 0 0   Down, Depressed, Hopeless 0 0 0 0 0   PHQ - 2 Score 0 1 0 0 0   Altered sleeping 2 - - 2 -   Tired, decreased energy 0 - - 1 -   Change in appetite 0 - - 1 -   Feeling bad or failure about yourself  0 - - 0 -   Trouble concentrating 0 - - 0 -   Moving slowly or fidgety/restless 0 - - 0 -   Suicidal thoughts 0 -  - 0 -   PHQ-9 Score 2 - - 4 -   Difficult doing work/chores Not difficult at all - - Somewhat difficult -     Interpretation of Total Score  Total Score Depression Severity:  1-4 = Minimal depression, 5-9 = Mild depression, 10-14 = Moderate depression, 15-19 = Moderately severe depression, 20-27 = Severe depression   Psychosocial Evaluation and Intervention: Psychosocial Evaluation - 01/21/19 1533      Discharge Psychosocial Assessment & Intervention   Comments  Patient has no psychosocial issues identified at discharge. Her exit QOL score improved by 14.56% at 26.55%. Her PHQ_9 score went from 2 to 1.        Psychosocial Re-Evaluation: Psychosocial Re-Evaluation    [place] Name 11/06/18 1311 11/26/18 1558 12/24/18 1323 01/21/19 0847       Psychosocial Re-Evaluation   Current issues with  None Identified  None Identified  None Identified  None Identified    Comments  Patient's initial QOL score was 23.17 and  her PHQ-9 score was 4 with no psychosocial issues identified.   Patient's initial QOL score was 23.17 and  her PHQ-9 score was 4 with no psychosocial issues identified.   Patient's initial QOL score was 23.17 and  her PHQ-9 score was 4 with no psychosocial issues identified.   Patient's initial QOL score was 23.17 and  her PHQ-9 score was 4 with no psychosocial issues identified.     Expected Outcomes  Patient will have no psychosocial issues identified at discharge.   Patient will have no psychosocial issues identified at discharge.   Patient will have no psychosocial issues identified  at discharge.   Patient will have no psychosocial issues identified at discharge.     Interventions  Relaxation education;Stress management education;Encouraged to attend Pulmonary Rehabilitation for the exercise  Relaxation education;Stress management education;Encouraged to attend Pulmonary Rehabilitation for the exercise  Relaxation education;Stress management education;Encouraged to attend Pulmonary  Rehabilitation for the exercise  Relaxation education;Stress management education;Encouraged to attend Pulmonary Rehabilitation for the exercise    Continue Psychosocial Services   No Follow up required  No Follow up required  No Follow up required  No Follow up required       Psychosocial Discharge (Final Psychosocial Re-Evaluation): Psychosocial Re-Evaluation - 01/21/19 0847      Psychosocial Re-Evaluation   Current issues with  None Identified    Comments  Patient's initial QOL score was 23.17 and  her PHQ-9 score was 4 with no psychosocial issues identified.     Expected Outcomes  Patient will have no psychosocial issues identified at discharge.     Interventions  Relaxation education;Stress management education;Encouraged to attend Pulmonary Rehabilitation for the exercise    Continue Psychosocial Services   No Follow up required        Education: Education Goals: Education classes will be provided on a weekly basis, covering required topics. Participant will state understanding/return demonstration of topics presented.  Learning Barriers/Preferences: Learning Barriers/Preferences - 10/08/18 1537      Learning Barriers/Preferences   Learning Barriers  None    Learning Preferences  Written Material       Education Topics: How Lungs Work and Diseases: - Discuss the anatomy of the lungs and diseases that can affect the lungs, such as COPD.   PULMONARY REHAB OTHER RESPIRATORY from 01/21/2019 in [place]  Date  12/24/18  Educator  Wynetta Emery  Instruction Review Code  2- Demonstrated Understanding      Exercise: -Discuss the importance of exercise, FITT principles of exercise, normal and abnormal responses to exercise, and how to exercise safely.   Environmental Irritants: -Discuss types of environmental irritants and how to limit exposure to environmental irritants.   PULMONARY REHAB OTHER RESPIRATORY from 01/21/2019 in [place]   Date  12/31/18  Educator  Wynetta Emery  Instruction Review Code  2- Demonstrated Understanding      Meds/Inhalers and oxygen: - Discuss respiratory medications, definition of an inhaler and oxygen, and the proper way to use an inhaler and oxygen.   PULMONARY REHAB OTHER RESPIRATORY from 01/21/2019 in [place]  Date  01/07/19  Educator  Wynetta Emery      Energy Saving Techniques: - Discuss methods to conserve energy and decrease shortness of breath when performing activities of daily living.    PULMONARY REHAB OTHER RESPIRATORY from 01/21/2019 in [place]  Date  01/14/19  Educator  Wynetta Emery  Instruction Review Code  2- Demonstrated Understanding      Bronchial Hygiene / Breathing Techniques: - Discuss breathing mechanics, pursed-lip breathing technique,  proper posture, effective ways to clear airways, and other functional breathing techniques   PULMONARY REHAB OTHER RESPIRATORY from 01/21/2019 in [place]  Date  10/22/18  Educator  Wynetta Emery  Instruction Review Code  2- Demonstrated Understanding      Cleaning Equipment: - Provides group verbal and written instruction about the health risks of elevated stress, cause of high stress, and healthy ways to reduce stress.   PULMONARY REHAB OTHER RESPIRATORY from 01/21/2019 in [place]  Date  10/29/18  Educator  Wynetta Emery  Instruction Review Code  2- Demonstrated Understanding      Nutrition I: Fats: - Discuss the types of cholesterol, what cholesterol does to the body, and how cholesterol levels can be controlled.   PULMONARY REHAB OTHER RESPIRATORY from 01/21/2019 in [place]  Date  11/05/18  Educator  Wynetta Emery  Instruction Review Code  2- Demonstrated Understanding      Nutrition II: Labels: -Discuss the different components of food labels and how to read food labels.   PULMONARY REHAB OTHER RESPIRATORY from 01/21/2019 in  [place]  Date  11/19/18  Educator  Wynetta Emery  Instruction Review Code  2- Demonstrated Understanding      Respiratory Infections: - Discuss the signs and symptoms of respiratory infections, ways to prevent respiratory infections, and the importance of seeking medical treatment when having a respiratory infection.   PULMONARY REHAB OTHER RESPIRATORY from 01/21/2019 in [place]  Date  11/26/18  Educator  Wynetta Emery  Instruction Review Code  2- Demonstrated Understanding      Stress I: Signs and Symptoms: - Discuss the causes of stress, how stress may lead to anxiety and depression, and ways to limit stress.   PULMONARY REHAB OTHER RESPIRATORY from 01/21/2019 in [place]  Date  12/03/18  Educator  MB  Instruction Review Code  2- Demonstrated Understanding      Stress II: Relaxation: -Discuss relaxation techniques to limit stress.   PULMONARY REHAB OTHER RESPIRATORY from 01/21/2019 in [place]  Date  12/10/18  Educator  Wynetta Emery  Instruction Review Code  2- Demonstrated Understanding      Oxygen for Home/Travel: - Discuss how to prepare for travel when on oxygen and proper ways to transport and store oxygen to ensure safety.   PULMONARY REHAB OTHER RESPIRATORY from 01/21/2019 in [place]  Date  12/17/18  Educator  DJ  Instruction Review Code  2- Demonstrated Understanding      Knowledge Questionnaire Score: Knowledge Questionnaire Score - 01/21/19 1528      Knowledge Questionnaire Score   Pre Score  14/18    Post Score  12/18       Core Components/Risk Factors/Patient Goals at Admission: Personal Goals and Risk Factors at Admission - 10/08/18 1538      Core Components/Risk Factors/Patient Goals on Admission    Weight Management  Weight Maintenance    Improve shortness of breath with ADL's  Yes    Intervention  Provide education, individualized exercise  plan and daily activity instruction to help decrease symptoms of SOB with activities of daily living.    Expected Outcomes  Short Term: Improve cardiorespiratory fitness to achieve a reduction of symptoms when performing ADLs;Long Term: Be able to perform more ADLs without symptoms or delay the onset of symptoms    Hypertension  Yes    Intervention  Provide education on lifestyle modifcations including regular physical activity/exercise, weight management, moderate sodium restriction and increased consumption of fresh fruit, vegetables, and low fat dairy, alcohol moderation, and smoking cessation.;Monitor prescription use compliance.    Expected Outcomes  Short Term: Continued assessment and intervention until BP is < 140/42m HG in hypertensive participants. < 130/850mHG in hypertensive participants with diabetes, heart failure or chronic kidney disease.;Long Term: Maintenance of blood pressure at goal levels.    Personal Goal Other  Yes    Personal Goal  Breathe better. Has less SOB with activities. Gain strength and stamina. Get back to playing golf.  Intervention  Patient will attend PR 2 days/week and supplement with exercise 3 days/week.     Expected Outcomes  Patient will complete the program and meet her personal goals.        Core Components/Risk Factors/Patient Goals Review:  Goals and Risk Factor Review    Row Name 11/06/18 1309 11/26/18 1556 12/24/18 1320 01/21/19 0844 01/21/19 1529     Core Components/Risk Factors/Patient Goals Review   Personal Goals Review  Weight Management/Obesity;Hypertension;Improve shortness of breath with ADL's Breathe better; gain strength; do activities w/o SOB; golf again.   Weight Management/Obesity;Hypertension;Improve shortness of breath with ADL's Breathe better; gain strength; do activities w/o SOB; golf again.   Weight Management/Obesity;Hypertension;Improve shortness of breath with ADL's Breathe better; gain strength; do activities w/o SOB; golf  again.   Weight Management/Obesity;Hypertension;Improve shortness of breath with ADL's Breathe better; gain strength; do activities w/o SOB; play golf again.  Weight Management/Obesity;Hypertension;Improve shortness of breath with ADL's Breathe better; gain strength; do activity w/o SOB.   Review  Patient has completed 8 sessions losing 1.5 lbs since her initial visit. She is doing well in the program with progression. She says she does feel stronger and has more energy. She feels like she will be ready to golf in the Spring if she continues the program. Her breathing has improved. Will continue to monitor for progress.   Patient has completed 11 sessions maintaining her weight since last 30 day review. She continues to do well in the program with progression. She says her energy continues to improve and she is breathing better. She feels like she will be ready to golf soon. Will continue to monitor for progress.   Patient has completed 19 session gaining 1 lb since last 30 day review. Patient states the program has given me a good emotional aura with improved stamina and breathing. She feels the program and really benefited her. She is ready to play golf in the Spring. She also states she has learned a lot in the education sessions. Will continue to monitor for progress.   Patient has completed 26 sessions gaining 2 lbs since last 30 day review. She continues to do well in the program with progression. She states she is able to walk her basement stairs without SOB or fatigue. She feels stronger and is breathing better. She is ready to play golf. She feels the program has helped her acheive her personal goals. He blood pressue is better controlled now since her medications have been adjusted. Will continue to monitor for progress.   Patient has graduated with 27 sessions gaining 2 lbs overall. She did well in the program. Her exit walk test improved by 14.2% and her exit measurements improved in grip strength and  balance. She states she is able to walk her basement stairs without SOB or fatigue. She feels stronger and is breathing better. She is ready to play golf. She feels the program has helped her acheive her personal goals. He blood pressue is better controlled now since her medications have been adjusted. She states she has learned a lot from the education sessions including stress management and breathing techniques. She plans to continue exercising by walking on her treadmill at home and playing golf. PR will f/u for 1 year.     Expected Outcomes  Patient will continue to attend sessions and complete the program meeting her personal goals.   Patient will continue to attend sessions and complete the program meeting her personal goals.   Patient will  continue to attend sessions and complete the program meeting her personal goals.   Patient will continue to attend sessions and complete the program meeting her personal goals.   Patient plans to continue exercising at home on her treadmill and by playing golf and continue to meet her personal goals.       Core Components/Risk Factors/Patient Goals at Discharge (Final Review):  Goals and Risk Factor Review - 01/21/19 1529      Core Components/Risk Factors/Patient Goals Review   Personal Goals Review  Weight Management/Obesity;Hypertension;Improve shortness of breath with ADL's   Breathe better; gain strength; do activity w/o SOB.   Review  Patient has graduated with 27 sessions gaining 2 lbs overall. She did well in the program. Her exit walk test improved by 14.2% and her exit measurements improved in grip strength and balance. She states she is able to walk her basement stairs without SOB or fatigue. She feels stronger and is breathing better. She is ready to play golf. She feels the program has helped her acheive her personal goals. He blood pressue is better controlled now since her medications have been adjusted. She states she has learned a lot from the  education sessions including stress management and breathing techniques. She plans to continue exercising by walking on her treadmill at home and playing golf. PR will f/u for 1 year.      Expected Outcomes  Patient plans to continue exercising at home on her treadmill and by playing golf and continue to meet her personal goals.        ITP Comments: ITP Comments    Row Name 10/19/18 1450           ITP Comments  Patient new to program. She has completed 2 sessions. Will continue to monitor for progress.           Comments: Patient graduated from Pulmonary Rehabilitation today on 01/21/19 after completing 27 sessions. He achieved LTG of 30 minutes of aerobic exercise at Max Met level of 5.2. All patients vitals are WNL.  Discharge instruction has been reviewed in detail and patient stated an understanding of material given. Patient plans to exercise at home on her Treadmill and to stay active by playing golf. Pulmonary Rehab staff will make f/u calls at 1 month, 6 months, and 1 year. Patient had no complaints of any abnormal S/S or pain on their exit visit.

## 2019-01-21 NOTE — Progress Notes (Signed)
Daily Session Note  Patient Details  Name: Susan Fitzgerald MRN: [id number] Date of Birth: 18-Nov-1932 Referring Provider:     PULMONARY REHAB OTHER RESP ORIENTATION from 10/08/2018 in [place]  Referring Provider  Icard      Encounter Date: 01/21/2019  Check In: Session Check In - 01/21/19 1045      Check-In   Supervising physician immediately available to respond to emergencies  See telemetry face sheet for immediately available MD    Location  [facility]    Staff Present  Benay Pike, Exercise Physiologist;Debra Wynetta Emery, RN, BSN;Diane Coad, MS, EP, [facility], Exercise Physiologist    Medication changes reported      No    Fall or balance concerns reported     No    Warm-up and Cool-down  Performed as group-led instruction    Resistance Training Performed  Yes    VAD Patient?  No    PAD/SET Patient?  No      Pain Assessment   Currently in Pain?  No/denies    Pain Score  0-No pain    Multiple Pain Sites  No       Capillary Blood Glucose: No results found for this or any previous visit (from the past 24 hour(s)).    Social History   Tobacco Use  Smoking Status Never Smoker  Smokeless Tobacco Never Used    Goals Met:  Proper associated with RPD/PD & O2 Sat Independence with exercise equipment Exercise tolerated well No report of cardiac concerns or symptoms Strength training completed today  Goals Unmet:  Not Applicable  Comments: Pt able to follow exercise prescription today without complaint.  Will continue to monitor for progression. Check out 1145.   Dr. Sinda Du is Medical Director for [facility].

## 2019-01-25 NOTE — Progress Notes (Signed)
HPI The patient presents for evaluation of atrial fibrillation. She was cardioverted.  However, she developed recurrent atrial fib.  She had her flecainide does reduced because of a prolonged QT interval.  The flecainide was subsequently discontinued as she had reverted to atrial fib.   She has been followed in the atrial fib clinic.  She did describe chest pain and was sent for [facility] which did not suggest ischemia.  She does have a mildly reduced EF (40 - 45% on echo.) Her last echo in March 2019 demonstrated an improved EF of 55 - 60%.    She was started on amiodarone and underwent DCCV.  She called recently with epidsodes of weak spells.  She has been in and out of fib but she does not think that this correlates with her sensation.  She was told over the phone to reduce her pindolol.   Last year she had increased dyspnea and decreased O2 sats.  She was treated for pneumonia.  She was referred to pulmonary.  A CT was done.  There was a thought that she might have an amiodarone pulmonary process.  Other processes could not be excluded such as a reaction to her methotrexate.  There was also suggestion of pulmonary edema.  She has been treated with diuresis.   She did have an increased creat but this is improved.   She was treated with steroids and did improve.  CXR done demonstrated probable diffuse fibrosis.   At the last visit she had increased BPs.  I added lisinopril to her afternoon meds.    She has seen pulmonary and I do see that she is being diagnosed with possible idiopathic pulmonary fibrosis.  She is due to see Dr.Mannan soon.  She is completed on [facility] rehab antipanic says she actually feels well. The patient denies any new symptoms such as chest discomfort, neck or arm discomfort. There has been no new shortness of breath, PND or orthopnea. There have been no reported palpitations, presyncope or syncope.    Allergies  Allergen Reactions  . Benazepril Hcl Cough    Current  Outpatient Medications  Medication Sig Dispense Refill  . apixaban (ELIQUIS) 2.5 MG TABS tablet Take 2.5 mg by mouth 2 (two) times daily.     . calcium-vitamin D (OSCAL WITH D) 250-125 MG-UNIT per tablet Take 1 tablet by mouth 2 (two) times daily.    Marland Kitchen diltiazem (DILACOR XR) 180 MG 24 hr capsule Take 180 mg by mouth daily.    . folic acid (FOLVITE) 1 MG tablet Take 2 mg by mouth daily.     . furosemide (LASIX) 20 MG tablet Take 1 tablet (20 mg total) by mouth daily. 180 tablet 1  . gabapentin (NEURONTIN) 100 MG capsule Take 100 mg by mouth at bedtime.     Marland Kitchen levothyroxine (SYNTHROID, LEVOTHROID) 75 MCG tablet TAKE 1 TABLET BY MOUTH  DAILY 90 tablet 3  . lisinopril (PRINIVIL,ZESTRIL) 20 MG tablet Take 1 tablet (20 mg total) by mouth daily. 90 tablet 3  . lisinopril-hydrochlorothiazide (PRINZIDE,ZESTORETIC) 20-12.5 MG tablet Take 1 tablet by mouth daily. 90 tablet 3  . Melatonin 5 MG TABS Take 5 mg by mouth at bedtime.    . methotrexate (RHEUMATREX) 2.5 MG tablet Take 10 mg by mouth once a week. Caution:Chemotherapy. Protect from light.    . Multiple Vitamin (MULTIVITAMIN) capsule Take 1 capsule by mouth daily.    . potassium chloride SA (KLOR-CON M20) 20 MEQ tablet Take 1  tablet (20 mEq total) by mouth daily. 90 tablet 1  . Cyanocobalamin (VITAMIN B-12 IJ) Inject once a month     Current Facility-Administered Medications  Medication Dose Route Frequency Provider Last Rate Last Dose  . cyanocobalamin ((VITAMIN B-12)) injection 1,000 mcg  1,000 mcg Intramuscular Q30 days Chipper Herb, MD   1,000 mcg at 01/11/19 1427    Past Medical History:  Diagnosis Date  . Arthritis   . Bradycardia 2014  . Calcification of bronchial airway 06/23/2018   01/2017 - See on CT imaging of the chest   . Cataract   . Cerebral vascular disease 06/19/2018   06/19/2018 - MRI with chronic microvascular ischemic change  . Diverticulosis   . Dyspnea    periodically  . Dysrhythmia    a - fib  . Heart murmur    ???  bruit  . History of stress test    a. 04/09/13-nuclear stress-no ischemia low risk  . Hypertension   . Menopause   . Osteoporosis   . PAF (paroxysmal atrial fibrillation) ([place])    a. s/p prior DCCV; b. On flecainide & coumadin (CHA2DS2VASc = 4);  c. 03/2016 Echo: EF 55-60%, mod LVH, mod AI, mild to mod TR, PASP 25mmHg.  Marland Kitchen Pelvic fracture [facility])     Past Surgical History:  Procedure Laterality Date  . BIOPSY BREAST    . BIOPSY BREAST     right & benign  . BREAST SURGERY    . CARDIOVERSION N/A 10/30/2016   Procedure: CARDIOVERSION;  Surgeon: Larey Dresser, MD;  Location: [place];  Service: Cardiovascular;  Laterality: N/A;  . CARDIOVERSION N/A 08/20/2017   Procedure: CARDIOVERSION;  Surgeon: Jerline Pain, MD;  Location: [facility] ENDOSCOPY;  Service: Cardiovascular;  Laterality: N/A;  . CATARACT EXTRACTION    . EYE SURGERY     cataracts  . TEE WITHOUT CARDIOVERSION N/A 10/30/2016   Procedure: TRANSESOPHAGEAL ECHOCARDIOGRAM (TEE);  Surgeon: Larey Dresser, MD;  Location: [place];  Service: Cardiovascular;  Laterality: N/A;  . TOTAL HIP ARTHROPLASTY Right 01/27/2017   Procedure: TOTAL HIP ARTHROPLASTY ANTERIOR APPROACH;  Surgeon: Rod Can, MD;  Location: [place];  Service: Orthopedics;  Laterality: Right;    ROS   As stated in the HPI and negative for all other systems.   PHYSICAL EXAM BP (!) 160/80   Pulse 76   Ht 5' (1.524 m)   Wt 120 lb (54.4 kg)   BMI 23.44 kg/m   GENERAL:  Well appearing NECK:  No jugular venous distention, waveform within normal limits, carotid upstroke brisk and symmetric, no bruits, no thyromegaly LUNGS:  Clear to auscultation bilaterally CHEST:  Unremarkable HEART:  PMI not displaced or sustained,S1 and S2 within normal limits, no S3, no S4, no clicks, no rubs, no murmurs ABD:  Flat, positive bowel sounds normal in frequency in pitch, no bruits, no rebound, no guarding, no midline pulsatile mass, no hepatomegaly, no splenomegaly EXT:  2 plus  pulses throughout, no edema, no cyanosis no clubbing   EKG: NA  Lab Results  Component Value Date   TSH 0.746 11/13/2018   ALT 18 12/09/2018   AST 32 12/09/2018   ALKPHOS 107 12/09/2018   BILITOT 0.2 12/09/2018   PROT 6.8 12/09/2018   ALBUMIN 4.0 12/09/2018     ASSESSMENT AND PLAN  HTN:       The blood pressure is at target. No change in medications is indicated. We will continue with therapeutic lifestyle changes (TLC).  ATRIAL FIB:  Ms. GREENLY RARICK has a CHA2DS2 - VASc score of 4 with a risk of stroke of 4%. No change in therapy.  She is maintaining NSR.   DYSPNEA:    She says she seems to be improved.  She is going to follow-up with pulmonary as above.  No further cardiac work-up is suggested.

## 2019-01-26 ENCOUNTER — Encounter (HOSPITAL_COMMUNITY): Payer: Medicare Other

## 2019-01-27 ENCOUNTER — Ambulatory Visit: Payer: Medicare Other | Admitting: Cardiology

## 2019-01-27 ENCOUNTER — Ambulatory Visit: Payer: Medicare Other | Admitting: *Deleted

## 2019-01-27 ENCOUNTER — Other Ambulatory Visit: Payer: Self-pay

## 2019-01-27 ENCOUNTER — Encounter: Payer: Self-pay | Admitting: Cardiology

## 2019-01-27 VITALS — BP 160/80 | HR 76 | Ht 60.0 in | Wt 120.0 lb

## 2019-01-27 DIAGNOSIS — I4819 Other persistent atrial fibrillation: Secondary | ICD-10-CM

## 2019-01-27 DIAGNOSIS — I10 Essential (primary) hypertension: Secondary | ICD-10-CM

## 2019-01-27 DIAGNOSIS — R0602 Shortness of breath: Secondary | ICD-10-CM

## 2019-01-27 NOTE — Patient Instructions (Signed)
Medication Instructions:  The current medical regimen is effective;  continue present plan and medications.  If you need a refill on your cardiac medications before your next appointment, please call your pharmacy.   Follow-Up: Follow up in 6 months with Dr. Hochrein in [place].  You will receive a letter in the mail 2 months before you are due.  Please call us when you receive this letter to schedule your follow up appointment.  Thank you for choosing [facility]!!     

## 2019-01-27 NOTE — Progress Notes (Signed)
Pt came in for Shingrix vaccine Cost of vaccine is $142 Pt wants to wait due to cost

## 2019-01-28 ENCOUNTER — Encounter (HOSPITAL_COMMUNITY): Payer: Medicare Other

## 2019-02-01 ENCOUNTER — Other Ambulatory Visit: Payer: Self-pay | Admitting: Cardiology

## 2019-02-01 MED ORDER — DILTIAZEM HCL ER 180 MG PO CP24: 180.0000 mg | ORAL_CAPSULE | Freq: Every day | ORAL | 3 refills | Status: DC

## 2019-02-01 NOTE — Telephone Encounter (Signed)
°*  STAT* If patient is at the pharmacy, call can be transferred to refill team.   1. Which medications need to be refilled? (please list name of each medication and dose if known)  diltiazem (DILACOR XR) 180 MG 24 hr capsule  2. Which pharmacy/location (including street and city if local pharmacy) is medication to be sent to? [place], [place]  3. Do they need a 30 day or 90 day supply? 90  Pt contacted [facility] on Friday, and they said they have not received the new rx for the patient's medication. She was previously taking a 120mg  dose, but was increased to 180mg . She will be out of medication in about 10 days

## 2019-02-01 NOTE — Telephone Encounter (Signed)
Rx sent to pharmacy. Patient has been notified directly and voiced understanding.

## 2019-02-02 ENCOUNTER — Encounter (HOSPITAL_COMMUNITY): Payer: Medicare Other

## 2019-02-04 ENCOUNTER — Encounter (HOSPITAL_COMMUNITY): Payer: Medicare Other

## 2019-02-08 ENCOUNTER — Ambulatory Visit: Payer: Medicare Other

## 2019-02-09 ENCOUNTER — Encounter (HOSPITAL_COMMUNITY): Payer: Medicare Other

## 2019-02-09 ENCOUNTER — Other Ambulatory Visit: Payer: Self-pay

## 2019-02-09 ENCOUNTER — Ambulatory Visit (INDEPENDENT_AMBULATORY_CARE_PROVIDER_SITE_OTHER): Payer: Medicare Other | Admitting: *Deleted

## 2019-02-09 DIAGNOSIS — L659 Nonscarring hair loss, unspecified: Secondary | ICD-10-CM

## 2019-02-10 ENCOUNTER — Other Ambulatory Visit: Payer: Self-pay | Admitting: Family Medicine

## 2019-02-10 ENCOUNTER — Telehealth: Payer: Self-pay | Admitting: Cardiology

## 2019-02-10 NOTE — Telephone Encounter (Signed)
Pt says she played golf yesterday and felt fine.. today she has noticed her HR on her home monitor has been fluctuating between 90-129.. she says it feels irregular and sure she is in afib... she says she feels well except for mild SOB but has been busy around her house without much rest today.. she says she does not feel urgent but would like Dr. Percival Spanish to know that she is possibly out of rhythm and if he would like to make any med changes or any other recommendations. Pt not sure of her BP.

## 2019-02-10 NOTE — Telephone Encounter (Signed)
New message:    Patient concerning her HR going up and down.please call patient back.

## 2019-02-11 ENCOUNTER — Ambulatory Visit: Payer: Medicare Other | Admitting: Pulmonary Disease

## 2019-02-11 ENCOUNTER — Other Ambulatory Visit: Payer: Self-pay

## 2019-02-11 ENCOUNTER — Telehealth: Payer: Self-pay | Admitting: Pulmonary Disease

## 2019-02-11 ENCOUNTER — Encounter: Payer: Self-pay | Admitting: Family

## 2019-02-11 ENCOUNTER — Encounter: Payer: Self-pay | Admitting: Pulmonary Disease

## 2019-02-11 ENCOUNTER — Encounter (HOSPITAL_COMMUNITY): Payer: Medicare Other

## 2019-02-11 ENCOUNTER — Ambulatory Visit (INDEPENDENT_AMBULATORY_CARE_PROVIDER_SITE_OTHER): Payer: Medicare Other | Admitting: Family

## 2019-02-11 VITALS — BP 118/62 | HR 122 | Ht 60.0 in | Wt 118.6 lb

## 2019-02-11 VITALS — BP 106/72 | HR 86 | Temp 98.3°F | Ht 60.0 in | Wt 118.8 lb

## 2019-02-11 DIAGNOSIS — I4891 Unspecified atrial fibrillation: Secondary | ICD-10-CM

## 2019-02-11 DIAGNOSIS — J849 Interstitial pulmonary disease, unspecified: Secondary | ICD-10-CM | POA: Diagnosis not present

## 2019-02-11 DIAGNOSIS — M069 Rheumatoid arthritis, unspecified: Secondary | ICD-10-CM | POA: Diagnosis not present

## 2019-02-11 LAB — HEPATIC FUNCTION PANEL
ALT: 21 U/L (ref 0–35)
AST: 32 U/L (ref 0–37)
Albumin: 3.9 g/dL (ref 3.5–5.2)
Alkaline Phosphatase: 78 U/L (ref 39–117)
Bilirubin, Direct: 0.1 mg/dL (ref 0.0–0.3)
Total Bilirubin: 0.4 mg/dL (ref 0.2–1.2)
Total Protein: 7.1 g/dL (ref 6.0–8.3)

## 2019-02-11 LAB — COMPREHENSIVE METABOLIC PANEL
ALT: 21 U/L (ref 0–35)
AST: 32 U/L (ref 0–37)
Albumin: 3.9 g/dL (ref 3.5–5.2)
Alkaline Phosphatase: 78 U/L (ref 39–117)
BUN: 27 mg/dL — AB (ref 6–23)
CO2: 31 mEq/L (ref 19–32)
Calcium: 9.8 mg/dL (ref 8.4–10.5)
Chloride: 101 mEq/L (ref 96–112)
Creatinine, Ser: 1.45 mg/dL — ABNORMAL HIGH (ref 0.40–1.20)
GFR: 34.33 mL/min — ABNORMAL LOW (ref >60.00)
Glucose, Bld: 88 mg/dL (ref 70–99)
POTASSIUM: 4.5 meq/L (ref 3.5–5.1)
Sodium: 141 mEq/L (ref 135–145)
Total Bilirubin: 0.4 mg/dL (ref 0.2–1.2)
Total Protein: 7.1 g/dL (ref 6.0–8.3)

## 2019-02-11 NOTE — Telephone Encounter (Signed)
Patient called again to check on status.

## 2019-02-11 NOTE — Telephone Encounter (Signed)
Nya,  Can you schedule her for a telehealth visit on Friday.   Call her today and see how she is doing.  If I need to call her today please let me know.

## 2019-02-11 NOTE — Progress Notes (Addendum)
Susan Fitzgerald    [id number]    [birth year]  Primary Care Physician:Moore, Estella Husk, MD  Referring Physician: Chipper Herb, MD [address] [place], [place] [address]  Chief complaint: Consult for interstitial lung disease  HPI: 85 year old with past medical history of atrial fibrillation, rheumatoid arthritis, pulmonary fibrosis.  Referred here for a second opinion on pulmonary fibrosis from Dr. Valeta Harms  Had a CT scan in 2019 which showed baseline fibrotic changes.  At that point she was on amiodarone for atrial fibrillation which was held.  She had been on amiodarone from 2017 for about 2 years.  She was treated for CHF on CT scan and prednisone with improvement in symptoms of dyspnea    She has been diagnosed with rheumatoid arthritis around 2017.  She follows with Dr. Amil Amen.  She was initially on methotrexate which was held briefly in 2019 due to elevated creatinine, but restarted around November 2019 for worsening symptoms.  She did not tolerate leflunomide due to significant hair loss.  Recently finished pulmonary rehab with improvement in symptoms of dyspnea.  She is back to golfing However for the last 3 days she is felt increasing palpitations and heart rate.  She has a call pending with cardiology for reassessment of atrial fibrillation.  Extended ILD questionnaire 02/12/2019-no known exposures, no mold, hot tub, Jacuzzi, humidifier, no birds at home. Smoking history: Never smoker Travel history: No significant travel history Relevant family history: No family history of lung disease or pulmonary fibrosis.  Outpatient Encounter Medications as of 02/11/2019  Medication Sig  . apixaban (ELIQUIS) 2.5 MG TABS tablet Take 2.5 mg by mouth 2 (two) times daily.   . calcium-vitamin D (OSCAL WITH D) 250-125 MG-UNIT per tablet Take 1 tablet by mouth 2 (two) times daily.  . Cyanocobalamin (VITAMIN B-12 IJ) Inject once a month  . diltiazem (DILACOR XR) 180 MG 24 hr capsule  Take 1 capsule (180 mg total) by mouth daily.  . folic acid (FOLVITE) 1 MG tablet Take 2 mg by mouth daily.   . furosemide (LASIX) 20 MG tablet Take 1 tablet (20 mg total) by mouth daily.  Marland Kitchen gabapentin (NEURONTIN) 100 MG capsule Take 100 mg by mouth at bedtime.   Marland Kitchen levothyroxine (SYNTHROID, LEVOTHROID) 75 MCG tablet TAKE 1 TABLET BY MOUTH  DAILY  . lisinopril (PRINIVIL,ZESTRIL) 20 MG tablet Take 1 tablet (20 mg total) by mouth daily.  Marland Kitchen lisinopril-hydrochlorothiazide (PRINZIDE,ZESTORETIC) 20-12.5 MG tablet Take 1 tablet by mouth daily.  . Melatonin 5 MG TABS Take 5 mg by mouth at bedtime.  . methotrexate (RHEUMATREX) 2.5 MG tablet Take 10 mg by mouth once a week. Caution:Chemotherapy. Protect from light.  . Multiple Vitamin (MULTIVITAMIN) capsule Take 1 capsule by mouth daily.  . potassium chloride SA (KLOR-CON M20) 20 MEQ tablet Take 1 tablet (20 mEq total) by mouth daily.   No facility-administered encounter medications on file as of 02/11/2019.     Allergies as of 02/11/2019 - Review Complete 02/11/2019  Allergen Reaction Noted  . Benazepril hcl Cough 02/22/2013    Past Medical History:  Diagnosis Date  . Arthritis   . Bradycardia 2014  . Calcification of bronchial airway 06/23/2018   01/2017 - See on CT imaging of the chest   . Cataract   . Cerebral vascular disease 06/19/2018   06/19/2018 - MRI with chronic microvascular ischemic change  . Diverticulosis   . Dyspnea    periodically  . Dysrhythmia  a - fib  . Heart murmur    ??? bruit  . History of stress test    a. 04/09/13-nuclear stress-no ischemia low risk  . Hypertension   . Menopause   . Osteoporosis   . PAF (paroxysmal atrial fibrillation) ([place])    a. s/p prior DCCV; b. On flecainide & coumadin (CHA2DS2VASc = 4);  c. 03/2016 Echo: EF 55-60%, mod LVH, mod AI, mild to mod TR, PASP 37mmHg.  Marland Kitchen Pelvic fracture [facility])     Past Surgical History:  Procedure Laterality Date  . BIOPSY BREAST    . BIOPSY BREAST     right &  benign  . BREAST SURGERY    . CARDIOVERSION N/A 10/30/2016   Procedure: CARDIOVERSION;  Surgeon: Larey Dresser, MD;  Location: [place];  Service: Cardiovascular;  Laterality: N/A;  . CARDIOVERSION N/A 08/20/2017   Procedure: CARDIOVERSION;  Surgeon: Jerline Pain, MD;  Location: [facility] ENDOSCOPY;  Service: Cardiovascular;  Laterality: N/A;  . CATARACT EXTRACTION    . EYE SURGERY     cataracts  . TEE WITHOUT CARDIOVERSION N/A 10/30/2016   Procedure: TRANSESOPHAGEAL ECHOCARDIOGRAM (TEE);  Surgeon: Larey Dresser, MD;  Location: [place];  Service: Cardiovascular;  Laterality: N/A;  . TOTAL HIP ARTHROPLASTY Right 01/27/2017   Procedure: TOTAL HIP ARTHROPLASTY ANTERIOR APPROACH;  Surgeon: Rod Can, MD;  Location: [place];  Service: Orthopedics;  Laterality: Right;    Family History  Problem Relation Age of Onset  . Stroke Mother        cerebral hemorrhage  . Heart disease Father        MI  . Heart attack Father   . Vision loss Father   . Heart disease Brother   . Heart attack Brother   . Cancer Maternal Aunt        breast  . Cancer Brother   . Heart disease Brother   . Cancer Daughter        breast    Social History   Socioeconomic History  . Marital status: Married    Spouse name: Rush Landmark   . Number of children: 2  . Years of education: Not on file  . Highest education level: Not on file  Occupational History  . Not on file  Social Needs  . Financial resource strain: Not hard at all  . Food insecurity:    Worry: Never true    Inability: Never true  . Transportation needs:    Medical: No    Non-medical: No  Tobacco Use  . Smoking status: Never Smoker  . Smokeless tobacco: Never Used  Substance and Sexual Activity  . Alcohol use: No  . Drug use: No  . Sexual activity: Yes  Lifestyle  . Physical activity:    Days per week: 2 days    Minutes per session: 60 min  . Stress: Only a little  Relationships  . Social connections:    Talks on phone: More than  three times a week    Gets together: More than three times a week    Attends religious service: More than 4 times per year    Active member of club or organization: Yes    Attends meetings of clubs or organizations: More than 4 times per year    Relationship status: Married  . Intimate partner violence:    Fear of current or ex partner: No    Emotionally abused: No    Physically abused: No    Forced sexual activity:  No  Other Topics Concern  . Not on file  Social History Narrative  . Not on file    Review of systems: Review of Systems  Constitutional: Negative for fever and chills.  HENT: Negative.   Eyes: Negative for blurred vision.  Respiratory: as per HPI  Cardiovascular: Negative for chest pain and palpitations.  Gastrointestinal: Negative for vomiting, diarrhea, blood per rectum. Genitourinary: Negative for dysuria, urgency, frequency and hematuria.  Musculoskeletal: Negative for myalgias, back pain and joint pain.  Skin: Negative for itching and rash.  Neurological: Negative for dizziness, tremors, focal weakness, seizures and loss of consciousness.  Endo/Heme/Allergies: Negative for environmental allergies.  Psychiatric/Behavioral: Negative for depression, suicidal ideas and hallucinations.  All other systems reviewed and are negative.  Physical Exam: Blood pressure 118/62, pulse (!) 122, height 5' (1.524 m), weight 118 lb 9.6 oz (53.8 kg), SpO2 98 %. Gen:      No acute distress HEENT:  EOMI, sclera anicteric Neck:     No masses; no thyromegaly Lungs:    Clear to auscultation bilaterally; normal respiratory effort CV:         Regular rate and rhythm; no murmurs Abd:      + bowel sounds; soft, non-tender; no palpable masses, no distension Ext:    No edema; adequate peripheral perfusion Skin:      Warm and dry; no rash Neuro: alert and oriented x 3 Psych: normal mood and affect  Data Reviewed: Imaging: CT chest 02/09/2017- mild reticulation, interstitial  prominence at the bases. CT chest 06/23/2018- groundglass attenuation, interlobular septal thickening, subpleural reticulation mild air trapping. CT chest 01/13/2019- peripheral and basilar pattern of subpleural reticulation, groundglass and traction bronchiectasis.  No honeycombing.  Probable UIP pattern.  I reviewed the images personally.  PFTs: 01/20/2019 FVC 2.17 [114%), FEV1 1.67 [120%],/F 77, TLC 78%, DLCO 56% Minimal restriction with moderate diffusion impairment.  Labs: CCP 02/15/2016-24 Rheumatoid factor 02/14/2018-23.2  Assessment:  Pulmonary fibrosis I have reviewed the CT scans which shows progression from 2018-2020.  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  Other considerations include amiodarone toxicity but she has been off amiodarone for some time now.  Methotrexate can cause pulmonary fibrosis in this pattern as well. Would not recommend work-up for IPF such as lung biopsy as it would not change our recommended management.  Given progression she will benefit from initiation of anti-fibrotics given recent data showing benefit and non-IPF progressive pulmonary fibrosis. [email or web address]://www.nejm.org/doi/full/10.1056/NEJMoa1908681  Agree with holding amiodarone given presence of pulmonary fibrosis. We will discuss with Dr. Amil Amen if she will qualify for alternatives to methotrexate for management of rheumatoid arthritis.  Plan/Recommendations: - Start paperwork for [employer] anti-fibrotic therapy - Discuss with rheumatology about the alternatives for methotrexate.  Marshell Garfinkel MD [facility] 02/11/2019, 10:37 AM  CC: Chipper Herb, MD  Addendum Discussed with Dr. Amil Amen about treatment for RA. Per his assessment she is on very low-dose of methotrexate with stable joint symptoms. We have decided to stop the methotrexate for now If there is progression of ILD on ofev then we would consider azathioprine or cellcept.  Marshell Garfinkel MD  [facility] 02/12/2019, 10:26 AM   Addendum Received clinic note from Dr. Amil Amen dated 03/23/2019 Patient is currently off methotrexate. She has been started on low-dose azathioprine 50 mg daily for RA joint disease.  Marshell Garfinkel MD [facility] 04/26/2019, 2:19 PM

## 2019-02-11 NOTE — Telephone Encounter (Signed)
Pt schedule for telephone visit 02/11/2019

## 2019-02-11 NOTE — Progress Notes (Signed)
   Subjective:    Patient ID: Susan Fitzgerald, female    DOB: 1932-02-13, 85 y.o.   MRN: [id number]  Chief Complaint  Patient presents with  . Atrial Fibrillation   States she went golfing Tuesday and played 18 holes and was doing well. Then woke up Wednesday morning and felt palpitations and weakness. She called her Cardiologists who could see her today, but suggested she come and get a EKG here.  Atrial Fibrillation  Presents for follow-up visit. Symptoms include palpitations, tachycardia and weakness. Symptoms are negative for bradycardia, hypertension, hypotension, shortness of breath and syncope. The symptoms have been stable. Past medical history includes atrial fibrillation.      Review of Systems  Respiratory: Negative for shortness of breath.   Cardiovascular: Positive for palpitations. Negative for syncope.  Neurological: Positive for weakness.  All other systems reviewed and are negative.      Objective:   Physical Exam Vitals signs reviewed.  Constitutional:      General: She is not in acute distress.    Appearance: She is well-developed.  HENT:     Head: Normocephalic and atraumatic.     Right Ear: Tympanic membrane normal.     Left Ear: Tympanic membrane normal.  Eyes:     Pupils: Pupils are equal, round, and reactive to light.  Neck:     Musculoskeletal: Normal range of motion and neck supple.     Thyroid: No thyromegaly.  Cardiovascular:     Rate and Rhythm: Tachycardia present. Rhythm irregular.     Heart sounds: Normal heart sounds. No murmur.  Pulmonary:     Effort: Pulmonary effort is normal. No respiratory distress.     Breath sounds: Normal breath sounds. No wheezing.  Abdominal:     General: Bowel sounds are normal. There is no distension.     Palpations: Abdomen is soft.     Tenderness: There is no abdominal tenderness.  Musculoskeletal: Normal range of motion.        General: No tenderness.  Skin:    General: Skin is warm and dry.   Neurological:     Mental Status: She is alert and oriented to person, place, and time.     Cranial Nerves: No cranial nerve deficit.     Deep Tendon Reflexes: Reflexes are normal and symmetric.  Psychiatric:        Behavior: Behavior normal.        Thought Content: Thought content normal.        Judgment: Judgment normal.       BP 106/72   Pulse 86   Temp 98.3 F (36.8 C) (Oral)   Ht 5' (1.524 m)   Wt 118 lb 12.8 oz (53.9 kg)   BMI 23.20 kg/m      Assessment & Plan:  Susan Fitzgerald comes in today with chief complaint of Atrial Fibrillation   Diagnosis and orders addressed:  1. Atrial fibrillation, unspecified type ([place]) Pt will call Cardiologists today Continue with current medications Limit Caffeine  RTO if symptoms worsen or do not improve  - EKG 12-Lead   Evelina Dun, FNP

## 2019-02-11 NOTE — Patient Instructions (Signed)
I have reviewed your scans which showed pulmonary fibrosis likely from rheumatoid arthritis I agree with holding the amiodarone.  We will discuss with Dr. Amil Amen about alternatives to methotrexate  We will check comprehensive metabolic panel today We will start you on a medication called Ofev for the pulmonary fibrosis. Follow-up in 1 month

## 2019-02-11 NOTE — Patient Instructions (Signed)
Atrial Fibrillation Atrial fibrillation is a type of irregular or rapid heartbeat (arrhythmia). In atrial fibrillation, the top part of the heart (atria) quivers in a chaotic pattern. This makes the heart unable to pump blood normally. Having atrial fibrillation can increase your risk for other health problems, such as:  Blood can pool in the atria and form clots. If a clot travels to the brain, it can cause a stroke.  The heart muscle may weaken from the irregular blood flow. This can cause heart failure. Atrial fibrillation may start suddenly and stop on its own, or it may become a long-lasting problem. What are the causes? This condition is caused by some heart-related conditions or procedures, including:  High blood pressure. This is the most common cause.  Heart failure.  Heart valve conditions.  Inflammation of the sac that surrounds the heart (pericarditis).  Heart surgery.  Coronary artery disease.  Certain heart rhythm disorders, such as Wolf-Parkinson-White syndrome. Other causes include:  Pneumonia.  Obstructive sleep apnea.  Lung cancer.  Thyroid problems, especially if the thyroid is overactive (hyperthyroidism).  Excessive alcohol or drug use. Sometimes, the cause of this condition is not known. What increases the risk? This condition is more likely to develop in:  Older people.  People who smoke.  People who have diabetes mellitus.  People who are overweight (obese).  Athletes who exercise vigorously.  People who have a family history. What are the signs or symptoms? Symptoms of this condition include:  A feeling that your heart is beating rapidly or irregularly.  A feeling of discomfort or pain in your chest.  Shortness of breath.  Sudden light-headedness or weakness.  Getting tired easily during exercise. In some cases, there are no symptoms. How is this diagnosed? Your health care provider may be able to detect atrial fibrillation when  taking your pulse. If detected, this condition may be diagnosed with:  Electrocardiogram (ECG).  Ambulatory cardiac monitor. This device records your heartbeats for 24 hours or more.  Transthoracic echocardiogram (TTE) to evaluate how blood flows through your heart.  Transesophageal echocardiogram (TEE) to view more detailed images of your heart.  A stress test.  Imaging tests, such as a CT scan or chest X-ray.  Blood tests. How is this treated? This condition may be treated with:  Medicines to slow down the heart rate or bring the heart's rhythm back to normal.  Medicines to prevent blood clots from forming.  Electrical cardioversion. This delivers a low-energy shock to the heart to reset its rhythm.  Ablation. This procedure destroys the part of the heart tissue that sends abnormal signals.  Left atrial appendage occlusion/excision. This seals off a common place in the atria where blood clots can form (left atrial appendage). The goal of treatment is to prevent blood clots from forming and to keep your heart beating at a normal rate and rhythm. Treatment depends on underlying medical conditions and how you feel when you are experiencing fibrillation. Follow these instructions at home: Medicines  Take over-the counter and prescription medicines only as told by your health care provider.  If your health care provider prescribed a blood-thinning medicine (anticoagulant), take it exactly as told. Taking too much blood-thinning medicine can cause bleeding. Taking too little can enable a blood clot to form and travel to the brain, causing a stroke. Lifestyle      Do not use any products that contain nicotine or tobacco, such as cigarettes and e-cigarettes. If you need help quitting, ask your health   care provider.  Do not drink beverages that contain caffeine, such as coffee, soda, and tea.  Follow diet instructions as told by your health care provider.  Exercise regularly as  told by your health care provider.  Do not drink alcohol. General instructions  If you have obstructive sleep apnea, manage your condition as told by your health care provider.  Maintain a healthy weight. Do not use diet pills unless your health care provider approves. Diet pills may make heart problems worse.  Keep all follow-up visits as told by your health care provider. This is important. Contact a health care provider if you:  Notice a change in the rate, rhythm, or strength of your heartbeat.  Are taking an anticoagulant and you notice increased bruising.  Tire more easily when you exercise or exert yourself.  Have a sudden change in weight. Get help right away if you have:   Chest pain, abdominal pain, sweating, or weakness.  Difficulty breathing.  Blood in your vomit, stool (feces), or urine.  Any symptoms of a stroke. "BE FAST" is an easy way to remember the main warning signs of a stroke: ? B - Balance. Signs are dizziness, sudden trouble walking, or loss of balance. ? E - Eyes. Signs are trouble seeing or a sudden change in vision. ? F - Face. Signs are sudden weakness or numbness of the face, or the face or eyelid drooping on one side. ? A - Arms. Signs are weakness or numbness in an arm. This happens suddenly and usually on one side of the body. ? S - Speech. Signs are sudden trouble speaking, slurred speech, or trouble understanding what people say. ? T - Time. Time to call emergency services. Write down what time symptoms started.  Other signs of a stroke, such as: ? A sudden, severe headache with no known cause. ? Nausea or vomiting. ? Seizure. These symptoms may represent a serious problem that is an emergency. Do not wait to see if the symptoms will go away. Get medical help right away. Call your local emergency services (911 in the [place] Do not drive yourself to the hospital. Summary  Atrial fibrillation is a type of irregular or rapid heartbeat  (arrhythmia).  Symptoms include a feeling that your heart is beating fast or irregularly. In some cases, you may not have symptoms.  The condition is treated with medicines to slow down the heart rate or bring the heart's rhythm back to normal. You may also need blood-thinning medicines to prevent blood clots.  Get help right away if you have symptoms or signs of a stroke. This information is not intended to replace advice given to you by your health care provider. Make sure you discuss any questions you have with your health care provider. Document Released: 11/04/2005 Document Revised: 12/26/2017 Document Reviewed: 12/26/2017 Elsevier Interactive Patient Education  2019 Elsevier Inc.  

## 2019-02-11 NOTE — Telephone Encounter (Signed)
Ofev enrollment application and [employer] application signed by Patient and Dr [name] Browner, faxed. Confirmation received.

## 2019-02-12 ENCOUNTER — Encounter: Payer: Self-pay | Admitting: Cardiology

## 2019-02-12 ENCOUNTER — Ambulatory Visit (INDEPENDENT_AMBULATORY_CARE_PROVIDER_SITE_OTHER): Payer: Medicare Other | Admitting: Cardiology

## 2019-02-12 ENCOUNTER — Telehealth: Payer: Self-pay | Admitting: Cardiology

## 2019-02-12 ENCOUNTER — Telehealth: Payer: Self-pay | Admitting: Pulmonary Disease

## 2019-02-12 ENCOUNTER — Encounter: Payer: Self-pay | Admitting: Pulmonary Disease

## 2019-02-12 VITALS — BP 110/66 | HR 70 | Ht 60.0 in | Wt 118.6 lb

## 2019-02-12 DIAGNOSIS — I10 Essential (primary) hypertension: Secondary | ICD-10-CM | POA: Diagnosis not present

## 2019-02-12 DIAGNOSIS — R0602 Shortness of breath: Secondary | ICD-10-CM | POA: Diagnosis not present

## 2019-02-12 DIAGNOSIS — I480 Paroxysmal atrial fibrillation: Secondary | ICD-10-CM | POA: Diagnosis not present

## 2019-02-12 MED ORDER — DILTIAZEM HCL ER COATED BEADS 240 MG PO CP24: 240.0000 mg | ORAL_CAPSULE | Freq: Every day | ORAL | 0 refills | Status: DC

## 2019-02-12 MED ORDER — DILTIAZEM HCL ER COATED BEADS 240 MG PO CP24: 240.0000 mg | ORAL_CAPSULE | Freq: Every day | ORAL | 3 refills | Status: DC

## 2019-02-12 NOTE — Patient Instructions (Signed)
Medication Instructions:  START- Cardizem 240 mg daily  If you need a refill on your cardiac medications before your next appointment, please call your pharmacy.  Labwork: None Ordered   Testing/Procedures: None Ordered  Follow-Up: . Please call our office next week to set up another telephone visit   At [facility], you and your health needs are our priority.  As part of our continuing mission to provide you with exceptional heart care, we have created designated Provider Care Teams.  These Care Teams include your primary Cardiologist (physician) and Advanced Practice Providers (APPs -  Physician Assistants and Nurse Practitioners) who all work together to provide you with the care you need, when you need it.  Thank you for choosing [facility] at [facility]!!

## 2019-02-12 NOTE — Progress Notes (Signed)
Virtual Visit via Telephone Note    Evaluation Performed:  Follow-up visit  This visit type was conducted due to national recommendations for restrictions regarding the COVID-19 Pandemic (e.g. social distancing).  This format is felt to be most appropriate for this patient at this time.  All issues noted in this document were discussed and addressed.  No physical exam was performed (except for noted visual exam findings with Video Visits).  Please refer to the patient's chart (MyChart message for video visits and phone note for telephone visits) for the patient's consent to telehealth for [facility].  Date:  02/14/2019   ID:  Susan Fitzgerald, DOB [birth year], MRN [id number]  Patient Location:  [place] [address]   Provider location:   [facility]  PCP:  Chipper Herb, MD  Cardiologist:  Minus Breeding, MD  Electrophysiologist:  None   Chief Complaint:  Palpitations  History of Present Illness:    Susan Fitzgerald is a 85 y.o. female who presents via audio/video conferencing for a telehealth visit today.    The patient presents for evaluation of atrial fibrillation. She was cardioverted.  However, she developed recurrent atrial fib.  She had her flecainide does reduced because of a prolonged QT interval.  The flecainide was subsequently discontinued as she had reverted to atrial fib.   She has been followed in the atrial fib clinic.  She did describe chest pain and was sent for [facility] which did not suggest ischemia.  She does have a mildly reduced EF (40 - 45% on echo.) Her last echo in March 2019 demonstrated an improved EF of 55 - 60%.    She was started on amiodarone and underwent DCCV.  She called recently with epidsodes of weak spells.  She has been in and out of fib but she does not think that this correlates with her sensation.  She was told over the phone to reduce her pindolol.   Last year she had increased dyspnea and decreased O2 sats.  She was treated  for pneumonia.  She was referred to pulmonary.  A CT was done.  There was a thought that she might have an amiodarone pulmonary process.  Other processes could not be excluded such as a reaction to her methotrexate.  There was also suggestion of pulmonary edema.  She has been treated with diuresis.   She did have an increased creat but this is improved.   She was treated with steroids and did improve.    She has not been told that she has idiopathic idiopathic pulmonary fibrosis.  She was doing really well on Tuesday.  However, she thinks she went into atrial fibrillation probably on Wednesday.  I did see an EKG done yesterday.  She had atrial fibrillation with a rate in the 110s.  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  She had poor anterior R wave progression.  She had this done in her primary care office.  She feels her heart rate is going in the 110s 130s.  She feels a little more fatigued.  She was making significant progress up to this point.  Family feels a rapid heart rate.  She is not having any presyncope or syncope.  She denies any chest pressure, neck or arm discomfort.  She is had no weight gain or edema.  Of note she was seen in the pulmonary clinic and I reviewed these records.  They are wanting to start Ofev.  The patient does not symptoms concerning for COVID-19 infection (fever, chills, cough, or new SHORTNESS OF BREATH).    Prior CV studies:   The following studies were reviewed today: Pulmonary records    Past Medical History:  Diagnosis Date   Arthritis    Bradycardia 2014   Calcification of bronchial airway 06/23/2018   01/2017 - See on CT imaging of the chest    Cataract    Cerebral vascular disease 06/19/2018   06/19/2018 - MRI with chronic microvascular ischemic change   Diverticulosis    Dyspnea    periodically   Dysrhythmia    a - fib   Heart murmur    ??? bruit   History of stress test    a. 04/09/13-nuclear stress-no ischemia  low risk   Hypertension    Menopause    Osteoporosis    PAF (paroxysmal atrial fibrillation) ([place])    a. s/p prior DCCV; b. On flecainide & coumadin (CHA2DS2VASc = 4);  c. 03/2016 Echo: EF 55-60%, mod LVH, mod AI, mild to mod TR, PASP 79mmHg.   Pelvic fracture ([place])    Past Surgical History:  Procedure Laterality Date   BIOPSY BREAST     BIOPSY BREAST     right & benign   BREAST SURGERY     CARDIOVERSION N/A 10/30/2016   Procedure: CARDIOVERSION;  Surgeon: Larey Dresser, MD;  Location: [place];  Service: Cardiovascular;  Laterality: N/A;   CARDIOVERSION N/A 08/20/2017   Procedure: CARDIOVERSION;  Surgeon: Jerline Pain, MD;  Location: [facility];  Service: Cardiovascular;  Laterality: N/A;   CATARACT EXTRACTION     EYE SURGERY     cataracts   TEE WITHOUT CARDIOVERSION N/A 10/30/2016   Procedure: TRANSESOPHAGEAL ECHOCARDIOGRAM (TEE);  Surgeon: Larey Dresser, MD;  Location: [place];  Service: Cardiovascular;  Laterality: N/A;   TOTAL HIP ARTHROPLASTY Right 01/27/2017   Procedure: TOTAL HIP ARTHROPLASTY ANTERIOR APPROACH;  Surgeon: Rod Can, MD;  Location: [place];  Service: Orthopedics;  Laterality: Right;     Current Meds  Medication Sig   apixaban (ELIQUIS) 2.5 MG TABS tablet Take 2.5 mg by mouth 2 (two) times daily.    calcium-vitamin D (OSCAL WITH D) 250-125 MG-UNIT per tablet Take 1 tablet by mouth 2 (two) times daily.   Cyanocobalamin (VITAMIN B-12 IJ) Inject once a month   diltiazem (DILACOR XR) 180 MG 24 hr capsule Take 1 capsule (180 mg total) by mouth daily.   folic acid (FOLVITE) 1 MG tablet Take 2 mg by mouth daily.    furosemide (LASIX) 20 MG tablet Take 1 tablet (20 mg total) by mouth daily.   gabapentin (NEURONTIN) 100 MG capsule Take 100 mg by mouth at bedtime.    levothyroxine (SYNTHROID, LEVOTHROID) 75 MCG tablet TAKE 1 TABLET BY MOUTH  DAILY   lisinopril (PRINIVIL,ZESTRIL) 20 MG tablet Take 1 tablet (20 mg total) by mouth  daily. (Patient taking differently: Take 20 mg by mouth every evening. )   lisinopril-hydrochlorothiazide (PRINZIDE,ZESTORETIC) 20-12.5 MG tablet Take 1 tablet by mouth daily. (Patient taking differently: Take 1 tablet by mouth every morning. )   Melatonin 5 MG TABS Take 5 mg by mouth at bedtime.   methotrexate (RHEUMATREX) 2.5 MG tablet Take 10 mg by mouth once a week. Caution:Chemotherapy. Protect from light.   Multiple Vitamin (MULTIVITAMIN) capsule Take 1 capsule by mouth daily.   potassium chloride SA (KLOR-CON M20) 20 MEQ tablet Take 1 tablet (20 mEq total) by mouth daily.  Allergies:   Benazepril hcl   Social History   Tobacco Use   Smoking status: Never Smoker   Smokeless tobacco: Never Used  Substance Use Topics   Alcohol use: No   Drug use: No     Family Hx: The patient's family history includes Cancer in her brother, daughter, and maternal aunt; Heart attack in her brother and father; Heart disease in her brother, brother, and father; Stroke in her mother; Vision loss in her father.  ROS:   Please see the history of present illness.     All other systems reviewed and are negative.   Labs/Other Tests and Data Reviewed:    Recent Labs: 06/16/2018: BNP 175.8 11/06/2018: Hemoglobin 11.4; Platelets 177 11/13/2018: TSH 0.746 02/11/2019: ALT 21; ALT 21; BUN 27; Creatinine, Ser 1.45; Potassium 4.5; Sodium 141   Recent Lipid Panel Lab Results  Component Value Date/Time   CHOL 176 12/09/2018 04:35 PM   CHOL 131 04/14/2013 08:36 AM   TRIG 74 12/09/2018 04:35 PM   TRIG 67 09/15/2017 08:45 AM   TRIG 69 04/14/2013 08:36 AM   HDL 63 12/09/2018 04:35 PM   HDL 71 09/15/2017 08:45 AM   HDL 53 04/14/2013 08:36 AM   CHOLHDL 2.8 12/09/2018 04:35 PM   LDLCALC 98 12/09/2018 04:35 PM   LDLCALC 64 04/14/2013 08:36 AM   LDLDIRECT 98 04/20/2014 10:15 AM    Wt Readings from Last 3 Encounters:  02/12/19 118 lb 9.6 oz (53.8 kg)  02/11/19 118 lb 12.8 oz (53.9 kg)    02/11/19 118 lb 9.6 oz (53.8 kg)     Exam:    Vital Signs:  BP 110/66 Comment: pt unable to obtain, bp cuff showing error   Pulse 70    Ht 5' (1.524 m)    Wt 118 lb 9.6 oz (53.8 kg)    SpO2 97%    BMI 23.16 kg/m     Unable to assess  ASSESSMENT & PLAN:    HTN:     Blood pressures been normal.  This will be treated in the context of managing her heart rhythm.  ATRIAL FIB:   Susan Fitzgerald has a CHA2DS2 - VASc score of 4 with a risk of stroke of 4%.  She is back in atrial fibrillation.  At this point I am to try to manage with anticoagulation and rate control.  Toward that end and then increase her Cardizem to 240 mg daily.  She will call early next week and I will decide whether I need another therapy such as digoxin.   DYSPNEA:    She has idiopathic pulmonary fibrosis.  She could not afford Ofev at the current cost.  We talked about whether she might start this in the future.  There is no drug interaction with Cardizem.  There is no known interaction although there is not any data about interaction with anticoagulation.  COVID-19 Education: The signs and symptoms of COVID-19 were discussed with the patient and how to seek care for testing (follow up with PCP or arrange E-visit).  The importance of social distancing was discussed today.  Patient Risk:   After full review of this patients clinical status, I feel that they are at least moderate risk at this time.  Time:   Today, I have spent 25 minutes with the patient with telehealth technology discussing atrial fibrillation, the treatment plan and [place]..     Medication Adjustments/Labs and Tests Ordered: Current medicines are reviewed at length with the patient  today.  Concerns regarding medicines are outlined above.  Tests Ordered: No orders of the defined types were placed in this encounter.  Medication Changes: Meds ordered this encounter  Medications   DISCONTD: diltiazem (CARDIZEM CD) 240 MG 24 hr capsule    Sig:  Take 1 capsule (240 mg total) by mouth daily.    Dispense:  30 capsule    Refill:  0   diltiazem (CARDIZEM CD) 240 MG 24 hr capsule    Sig: Take 1 capsule (240 mg total) by mouth daily.    Dispense:  90 capsule    Refill:  3    Disposition:  She will call in one week to set up a visit.   Signed, Minus Breeding, MD  02/14/2019 4:29 PM    [facility]

## 2019-02-12 NOTE — Telephone Encounter (Signed)
New message   Pt c/o medication issue:  1. Name of Medication:   diltiazem (DILACOR XR) 180 MG 24 hr capsule     2. How are you currently taking this medication (dosage and times per day)?n/a  3. Are you having a reaction (difficulty breathing--STAT)?n/a  4. What is your medication issue? Patient is waiting to have this medication called into [facility] in [place], [place].

## 2019-02-12 NOTE — Telephone Encounter (Signed)
Called and spoke with Patient.  Patient was concerned about the cost of Ofev. Patient called her insurance company and [employer].  Patient was quoted the cost of $3,000/30month supply.  Patient stated she could not afford the cost of Ofev.  Patient stated she had received a call from [employer], nurse educator.  Patient stated she was told of Patient assistance.   Explained application process, [employer].  Patient stated understanding.  Patient agreed to go forward with Ofev.  Nothing further at this time.

## 2019-02-12 NOTE — Telephone Encounter (Signed)
Diltiazem was increased to 240 mg 02/12/19 at office visit . Pharmacy have received script but had to order will have script ready tom./cy  Pt aware ./cy

## 2019-02-14 ENCOUNTER — Encounter: Payer: Self-pay | Admitting: Cardiology

## 2019-02-16 ENCOUNTER — Telehealth: Payer: Self-pay | Admitting: Pulmonary Disease

## 2019-02-16 ENCOUNTER — Institutional Professional Consult (permissible substitution): Payer: Medicare Other | Admitting: Pulmonary Disease

## 2019-02-16 NOTE — Telephone Encounter (Signed)
Spoke with [name] Basta and she is checking on the status for PA for Ofev for pt. There was no PA initiated so I initiated the PA on CMM to [employer]. We have to await response from insurance so I will route to [place] to follow up    [facility] Key: [id number] - PA Case ID: [id number]  Need help? Call us at [phone]    Status  Sent to [employer]   Drug Ofev 150MG  capsules Form [employer] Medicare Part D Electronic Prior Authorization Form (2017 NCPDP)

## 2019-02-17 NOTE — Telephone Encounter (Signed)
PA is still in process 

## 2019-02-17 NOTE — Telephone Encounter (Signed)
Called and spoke with patient today regarding PA status PA for Ofev has been approved 02/16/19 thru 11/18/2019 Pt expressed understanding, stated it will be delivered tomorrow Nothing further needed.

## 2019-02-18 ENCOUNTER — Telehealth: Payer: Self-pay | Admitting: Cardiology

## 2019-02-18 NOTE — Telephone Encounter (Signed)
New Message   Patient states she's calling to report in as requested by Nya.

## 2019-02-18 NOTE — Telephone Encounter (Signed)
Will forward to Nya to arrange telephone visit

## 2019-02-19 ENCOUNTER — Telehealth: Payer: Self-pay | Admitting: Pulmonary Disease

## 2019-02-19 DIAGNOSIS — Z5181 Encounter for therapeutic drug level monitoring: Secondary | ICD-10-CM

## 2019-02-19 NOTE — Telephone Encounter (Signed)
Patient is aware nothing further needed at this time.  

## 2019-02-19 NOTE — Telephone Encounter (Signed)
Pt had BMET and hepatic function drawn on 3/26. She is calling for results.

## 2019-02-19 NOTE — Telephone Encounter (Signed)
Hepatic panel was normal, CMET showed kidney functio was slightly off. Please recheck CMET and Hepatic panel (LFT's) in 1 month. This might already be ordered.

## 2019-02-22 NOTE — Telephone Encounter (Signed)
Virtual visit schedule for 04/07@2pm 

## 2019-02-23 ENCOUNTER — Telehealth (INDEPENDENT_AMBULATORY_CARE_PROVIDER_SITE_OTHER): Payer: Medicare Other | Admitting: Cardiology

## 2019-02-23 ENCOUNTER — Other Ambulatory Visit: Payer: Self-pay

## 2019-02-23 ENCOUNTER — Encounter: Payer: Self-pay | Admitting: Cardiology

## 2019-02-23 ENCOUNTER — Telehealth: Payer: Self-pay | Admitting: *Deleted

## 2019-02-23 VITALS — BP 132/77 | HR 79 | Ht 60.0 in | Wt 117.0 lb

## 2019-02-23 DIAGNOSIS — I482 Chronic atrial fibrillation, unspecified: Secondary | ICD-10-CM | POA: Diagnosis not present

## 2019-02-23 DIAGNOSIS — J849 Interstitial pulmonary disease, unspecified: Secondary | ICD-10-CM

## 2019-02-23 NOTE — Telephone Encounter (Signed)
Virtual Visit Pre-Appointment Phone Call  Steps For Call:  1. Confirm consent - "In the setting of the current Covid19 crisis, you are scheduled for a (phone or video) visit with your provider on (date) at (time).  Just as we do with many in-office visits, in order for you to participate in this visit, we must obtain consent.  If you'd like, I can send this to your mychart (if signed up) or email for you to review.  Otherwise, I can obtain your verbal consent now.  All virtual visits are billed to your insurance company just like a normal visit would be.  By agreeing to a virtual visit, we'd like you to understand that the technology does not allow for your provider to perform an examination, and thus may limit your provider's ability to fully assess your condition.  Finally, though the technology is pretty good, we cannot assure that it will always work on either your or our end, and in the setting of a video visit, we may have to convert it to a phone-only visit.  In either situation, we cannot ensure that we have a secure connection.  Are you willing to proceed?"  2. Give patient instructions for WebEx download to smartphone as below if video visit  3. Advise patient to be prepared with any vital sign or heart rhythm information, their current medicines, and a piece of paper and pen handy for any instructions they may receive the day of their visit  4. Inform patient they will receive a phone call 15 minutes prior to their appointment time (may be from unknown caller ID) so they should be prepared to answer  5. Confirm that appointment type is correct in Epic appointment notes (video vs telephone)    TELEPHONE CALL NOTE  Susan Fitzgerald has been deemed a candidate for a follow-up tele-health visit to limit community exposure during the Covid-19 pandemic. I spoke with the patient via phone to ensure availability of phone/video source, confirm preferred email & phone number, and discuss  instructions and expectations.  I reminded Susan Fitzgerald to be prepared with any vital sign and/or heart rhythm information that could potentially be obtained via home monitoring, at the time of her visit. I reminded Susan Fitzgerald to expect a phone call at the time of her visit if her visit.  Did the patient verbally acknowledge consent to treatment? Yes  [name] 02/23/2019 2:03 PM   DOWNLOADING THE [place], go to [employer] and type in WebEx in the search bar. [place] [employer], the blue/green circle. The app is free but as with any other app downloads, their phone may require them to verify saved payment information or Apple password. The patient does NOT have to create an account.  - If Android, ask patient to go to [employer] and type in WebEx in the search bar. [place] [employer], the blue/green circle. The app is free but as with any other app downloads, their phone may require them to verify saved payment information or Android password. The patient does NOT have to create an account.   CONSENT FOR TELE-HEALTH VISIT - PLEASE REVIEW  I hereby voluntarily request, consent and authorize [facility] and its employed or contracted physicians, physician assistants, nurse practitioners or other licensed health care professionals (the Practitioner), to provide me with telemedicine health care services (the "Services") as deemed necessary by the treating Practitioner. I acknowledge  and consent to receive the Services by the Practitioner via telemedicine. I understand that the telemedicine visit will involve communicating with the Practitioner through live audiovisual communication technology and the disclosure of certain medical information by electronic transmission. I acknowledge that I have been given the opportunity to request an in-person assessment or other available alternative prior to the telemedicine visit and am  voluntarily participating in the telemedicine visit.  I understand that I have the right to withhold or withdraw my consent to the use of telemedicine in the course of my care at any time, without affecting my right to future care or treatment, and that the Practitioner or I may terminate the telemedicine visit at any time. I understand that I have the right to inspect all information obtained and/or recorded in the course of the telemedicine visit and may receive copies of available information for a reasonable fee.  I understand that some of the potential risks of receiving the Services via telemedicine include:  Marland Kitchen Delay or interruption in medical evaluation due to technological equipment failure or disruption; . Information transmitted may not be sufficient (e.g. poor resolution of images) to allow for appropriate medical decision making by the Practitioner; and/or  . In rare instances, security protocols could fail, causing a breach of personal health information.  Furthermore, I acknowledge that it is my responsibility to provide information about my medical history, conditions and care that is complete and accurate to the best of my ability. I acknowledge that Practitioner's advice, recommendations, and/or decision may be based on factors not within their control, such as incomplete or inaccurate data provided by me or distortions of diagnostic images or specimens that may result from electronic transmissions. I understand that the practice of medicine is not an exact science and that Practitioner makes no warranties or guarantees regarding treatment outcomes. I acknowledge that I will receive a copy of this consent concurrently upon execution via email to the email address I last provided but may also request a printed copy by calling the office of [place].    I understand that my insurance will be billed for this visit.   I have read or had this consent read to me. . I understand the  contents of this consent, which adequately explains the benefits and risks of the Services being provided via telemedicine.  . I have been provided ample opportunity to ask questions regarding this consent and the Services and have had my questions answered to my satisfaction. . I give my informed consent for the services to be provided through the use of telemedicine in my medical care  By participating in this telemedicine visit I agree to the above.

## 2019-02-23 NOTE — Progress Notes (Signed)
Virtual Visit via Video Note   This visit type was conducted due to national recommendations for restrictions regarding the COVID-19 Pandemic (e.g. social distancing) in an effort to limit this patient's exposure and mitigate transmission in our community.  Due to her co-morbid illnesses, this patient is at least at moderate risk for complications without adequate follow up.  This format is felt to be most appropriate for this patient at this time.  All issues noted in this document were discussed and addressed.  A limited physical exam was performed with this format.  Please refer to the patient's chart for her consent to telehealth for [facility].   Evaluation Performed:  Follow-up visit  Date:  02/23/2019   ID:  Susan Fitzgerald, Susan Fitzgerald 31, 1933, MRN [id number]  Patient Location: Home  Provider Location: Home  PCP:  Chipper Herb, MD  Cardiologist:  Minus Breeding, MD  Electrophysiologist:  None   Chief Complaint:  Irregular heart beat  History of Present Illness:    Susan Fitzgerald is a 85 y.o. female who presents via audio/video conferencing for a telehealth visit today.    Susan Fitzgerald is a 85 y.o. female who presents via audio/video conferencing for a telehealth visit today.    The patient presents for evaluation of atrial fibrillation. She was cardioverted. However, she developed recurrent atrial fib. She had her flecainide does reduced because of a prolonged QT interval. The flecainide was subsequently discontinued as she had reverted to atrial fib. She has been followed in the atrial fib clinic. She did describe chest pain and was sent for [facility] which did not suggest ischemia. She does have a mildly reduced EF (40 - 45% on echo.) Her last echo in March 2019 demonstrated an improved EF of 55 - 60%. She was started on amiodarone and underwent DCCV. She called recently with epidsodes of weak spells. She has been in and out of fib but she does not think  that this correlates with her sensation. She was told over the phone to reduce her pindolol. Last year she had increased dyspnea and decreased O2 sats. She was treated for pneumonia. She was referred to pulmonary. A CT was done. There was a thought that she might have an amiodarone pulmonary process. Other processes could not be excluded such as a reaction to her methotrexate. There was also suggestion of pulmonary edema. She has been treated with diuresis. She did have an increased creat but this is improved. She was treated with steroids and did improve.   She has not been told that she has idiopathic idiopathic pulmonary fibrosis.  I talked to her last week and she was having increased heart rate.  I increased her Cardizem and we scheduled this appointment to see if she was having any improvement.  Since then her blood pressures have been okay and she is tolerated the increased dose of Cardizem and it sounds like her heart rates have been in the 70s to 80s.  It still irregular and she might feel this but she is not having any presyncope or syncope.  She is not having any chest pressure, neck or arm discomfort.  Of note she did get approval of an funding for Ofev.  She just started this The patient does not symptoms concerning for COVID-19 infection (fever, chills, cough, or new SHORTNESS OF BREATH).    The patient does not have symptoms concerning for COVID-19 infection (fever, chills, cough, or new shortness of breath).  Past Medical History:  Diagnosis Date   Arthritis    Bradycardia 2014   Calcification of bronchial airway 06/23/2018   01/2017 - See on CT imaging of the chest    Cataract    Cerebral vascular disease 06/19/2018   06/19/2018 - MRI with chronic microvascular ischemic change   Diverticulosis    Dyspnea    periodically   Dysrhythmia    a - fib   Heart murmur    ??? bruit   History of stress test    a. 04/09/13-nuclear stress-no ischemia low risk    Hypertension    Menopause    Osteoporosis    PAF (paroxysmal atrial fibrillation) ([place])    a. s/p prior DCCV; b. On flecainide & coumadin (CHA2DS2VASc = 4);  c. 03/2016 Echo: EF 55-60%, mod LVH, mod AI, mild to mod TR, PASP 49mmHg.   Pelvic fracture ([place])    Past Surgical History:  Procedure Laterality Date   BIOPSY BREAST     BIOPSY BREAST     right & benign   BREAST SURGERY     CARDIOVERSION N/A 10/30/2016   Procedure: CARDIOVERSION;  Surgeon: Larey Dresser, MD;  Location: [place];  Service: Cardiovascular;  Laterality: N/A;   CARDIOVERSION N/A 08/20/2017   Procedure: CARDIOVERSION;  Surgeon: Jerline Pain, MD;  Location: [place] [facility];  Service: Cardiovascular;  Laterality: N/A;   CATARACT EXTRACTION     EYE SURGERY     cataracts   TEE WITHOUT CARDIOVERSION N/A 10/30/2016   Procedure: TRANSESOPHAGEAL ECHOCARDIOGRAM (TEE);  Surgeon: Larey Dresser, MD;  Location: [place];  Service: Cardiovascular;  Laterality: N/A;   TOTAL HIP ARTHROPLASTY Right 01/27/2017   Procedure: TOTAL HIP ARTHROPLASTY ANTERIOR APPROACH;  Surgeon: Rod Can, MD;  Location: [place];  Service: Orthopedics;  Laterality: Right;     Current Meds  Medication Sig   apixaban (ELIQUIS) 2.5 MG TABS tablet Take 2.5 mg by mouth 2 (two) times daily.    calcium-vitamin D (OSCAL WITH D) 250-125 MG-UNIT per tablet Take 1 tablet by mouth 2 (two) times daily.   Cyanocobalamin (VITAMIN B-12 IJ) Inject once a month   diltiazem (CARDIZEM CD) 240 MG 24 hr capsule Take 1 capsule (240 mg total) by mouth daily.   folic acid (FOLVITE) 1 MG tablet Take 2 mg by mouth daily.    furosemide (LASIX) 20 MG tablet Take 1 tablet (20 mg total) by mouth daily.   gabapentin (NEURONTIN) 100 MG capsule Take 100 mg by mouth at bedtime.    levothyroxine (SYNTHROID, LEVOTHROID) 75 MCG tablet TAKE 1 TABLET BY MOUTH  DAILY   lisinopril (PRINIVIL,ZESTRIL) 20 MG tablet Take 1 tablet (20 mg total) by mouth daily.  (Patient taking differently: Take 20 mg by mouth every evening. )   lisinopril-hydrochlorothiazide (PRINZIDE,ZESTORETIC) 20-12.5 MG tablet Take 1 tablet by mouth daily. (Patient taking differently: Take 1 tablet by mouth every morning. )   Melatonin 5 MG TABS Take 5 mg by mouth at bedtime.   Multiple Vitamin (MULTIVITAMIN) capsule Take 1 capsule by mouth daily.   OFEV 150 MG CAPS Take 1 tablet by mouth 2 (two) times daily.   potassium chloride SA (KLOR-CON M20) 20 MEQ tablet Take 1 tablet (20 mEq total) by mouth daily.     Allergies:   Benazepril hcl   Social History   Tobacco Use   Smoking status: Never Smoker   Smokeless tobacco: Never Used  Substance Use Topics   Alcohol use: No   Drug use:  No     Family Hx: The patient's family history includes Cancer in her brother, daughter, and maternal aunt; Heart attack in her brother and father; Heart disease in her brother, brother, and father; Stroke in her mother; Vision loss in her father.  ROS:   Please see the history of present illness.    Positive for chronic SOB Otherwise as stated in the HPI and negative for all other systems.  Prior CV studies:   The following studies were reviewed today:   Labs/Other Tests and Data Reviewed:    EKG:  No ECG reviewed.  Recent Labs: 06/16/2018: BNP 175.8 11/06/2018: Hemoglobin 11.4; Platelets 177 11/13/2018: TSH 0.746 02/11/2019: ALT 21; ALT 21; BUN 27; Creatinine, Ser 1.45; Potassium 4.5; Sodium 141   Recent Lipid Panel Lab Results  Component Value Date/Time   CHOL 176 12/09/2018 04:35 PM   CHOL 131 04/14/2013 08:36 AM   TRIG 74 12/09/2018 04:35 PM   TRIG 67 09/15/2017 08:45 AM   TRIG 69 04/14/2013 08:36 AM   HDL 63 12/09/2018 04:35 PM   HDL 71 09/15/2017 08:45 AM   HDL 53 04/14/2013 08:36 AM   CHOLHDL 2.8 12/09/2018 04:35 PM   LDLCALC 98 12/09/2018 04:35 PM   LDLCALC 64 04/14/2013 08:36 AM   LDLDIRECT 98 04/20/2014 10:15 AM    Wt Readings from Last 3 Encounters:    02/23/19 117 lb (53.1 kg)  02/12/19 118 lb 9.6 oz (53.8 kg)  02/11/19 118 lb 12.8 oz (53.9 kg)     Objective:    Vital Signs:  BP 132/77    Pulse 79    Ht 5' (1.524 m)    Wt 117 lb (53.1 kg)    BMI 22.85 kg/m    Well nourished, well developed female in no acute distress.  Mild pursed lip breathing.    ASSESSMENT & PLAN:     HTN: Blood pressures tolerates the increased Cardizem.   No change in therapy.   ATRIAL FIB:Her rate does seem better controlled.  I had considered whether she might need digoxin but I think she is doing okay.  By exam today she is still in fibrillation when I listen to her heartbeat on her monitors. Ms.Carleen S Vaughnhas a CHA2DS2 - VASc score of 4 with a risk of stroke of 4%.    She will continue on her current meds including anticoagulation  DYSPNEA: She has idiopathic pulmonary fibrosis.    She has not started on  Ofev.  Of note I looked at this previously and there is no interaction with any of her ongoing meds.    COVID-19 Education: The signs and symptoms of COVID-19 were discussed with the patient and how to seek care for testing (follow up with PCP or arrange E-visit).  The importance of social distancing was discussed today.  She wants to go golfing with her girlfriends we discussed how she would still socially distance.  Time:   Today, I have spent 20 minutes with the patient with telehealth technology discussing the above problems.     Medication Adjustments/Labs and Tests Ordered: Current medicines are reviewed at length with the patient today.  Concerns regarding medicines are outlined above.  Tests Ordered: No orders of the defined types were placed in this encounter.  Medication Changes: No orders of the defined types were placed in this encounter.   Disposition:  Follow up in 2 month(s)  Signed, Minus Breeding, MD  02/23/2019 2:22 PM    [facility]

## 2019-02-23 NOTE — Patient Instructions (Signed)
Medication Instructions:  Continue current medications  If you need a refill on your cardiac medications before your next appointment, please call your pharmacy.  Labwork: None Ordered   Testing/Procedures: None Ordered  Follow-Up: . Your physician recommends that you schedule a follow-up appointment in: 2 Months   At [facility], you and your health needs are our priority.  As part of our continuing mission to provide you with exceptional heart care, we have created designated Provider Care Teams.  These Care Teams include your primary Cardiologist (physician) and Advanced Practice Providers (APPs -  Physician Assistants and Nurse Practitioners) who all work together to provide you with the care you need, when you need it.  Thank you for choosing [facility] at [facility]!!

## 2019-02-24 ENCOUNTER — Telehealth: Payer: Self-pay | Admitting: Pulmonary Disease

## 2019-02-24 NOTE — Telephone Encounter (Signed)
Called and spoke with pt in regards to the [facility] as pt has been on it x5 days now. Stated to pt that grants and patient assistance is usually handled by [facility] and also if she could possibly be qualified for anything else and pt stated she had received info in the mail about possibly being able to be qualified for a grant. I stated to pt to pt to fill that out as well as it would never hurt to see if she could also be qualified for the grant too. Pt expressed understanding. Nothing further needed.

## 2019-03-02 ENCOUNTER — Other Ambulatory Visit: Payer: Self-pay | Admitting: *Deleted

## 2019-03-02 MED ORDER — OFEV 150 MG PO CAPS: 150.0000 mg | ORAL_CAPSULE | Freq: Two times a day (BID) | ORAL | 5 refills | Status: DC

## 2019-03-09 ENCOUNTER — Telehealth: Payer: Self-pay | Admitting: Pulmonary Disease

## 2019-03-09 NOTE — Telephone Encounter (Signed)
Called pt and spoke with husband Susan Fitzgerald letting him know that I will send Rx of OFEV to [facility] for pt but stated to him that we send the med a month at a time due to making sure pts are tolerating med okay without any problems. [name] expressed understanding. Stated to Susan Fitzgerald that an Rx was sent to [place] [facility] for pt 03/02/2019 with 5 additional refills so stated to him for pt to call [facility] to see if they are able to get RX filled for pt and if they needed a new Rx to call us and we would take care of it. [name] expressed understanding. Nothing further needed.

## 2019-03-15 ENCOUNTER — Ambulatory Visit (INDEPENDENT_AMBULATORY_CARE_PROVIDER_SITE_OTHER): Payer: Medicare Other | Admitting: *Deleted

## 2019-03-15 ENCOUNTER — Other Ambulatory Visit: Payer: Self-pay

## 2019-03-15 ENCOUNTER — Telehealth: Payer: Self-pay | Admitting: Pulmonary Disease

## 2019-03-15 DIAGNOSIS — L659 Nonscarring hair loss, unspecified: Secondary | ICD-10-CM | POA: Diagnosis not present

## 2019-03-15 DIAGNOSIS — Z5181 Encounter for therapeutic drug level monitoring: Secondary | ICD-10-CM | POA: Diagnosis not present

## 2019-03-15 DIAGNOSIS — Z23 Encounter for immunization: Secondary | ICD-10-CM

## 2019-03-15 MED ORDER — CYANOCOBALAMIN 1000 MCG/ML IJ SOLN
1000.0000 ug | INTRAMUSCULAR | Status: DC
  Administered 2019-03-15 – 2020-01-03 (×10): 1000 ug via INTRAMUSCULAR

## 2019-03-15 NOTE — Addendum Note (Signed)
Addended by: Liliane Bade on: 03/15/2019 02:58 PM   Modules accepted: Orders

## 2019-03-15 NOTE — Telephone Encounter (Signed)
Labs ordered for therapeutic drug monitoring by Dr.Mannam are CMET and Hepatic Panel.  Called [phone] to try and reach Janett Billow at [facility]. Let linel ring 20 times, and no one picked up line, no vmail option. Will try again later.

## 2019-03-15 NOTE — Progress Notes (Signed)
Pt given Cyanocobalamin inj Pt also given 2nd Shingrix vaccine Tolerated both injs well

## 2019-03-15 NOTE — Telephone Encounter (Signed)
Called [facility] and was told the order for the CMET needs to be ordered for [facility] as the collecting lab so they can send it to be processed. It was ordered under [place] which they cannot do.  Advised the order will be changed and updated.  Nothing further needed at this time.

## 2019-03-16 LAB — COMPREHENSIVE METABOLIC PANEL
ALT: 20 IU/L (ref 0–32)
AST: 32 IU/L (ref 0–40)
Albumin/Globulin Ratio: 1.6 (ref 1.2–2.2)
Albumin: 4.2 g/dL (ref 3.6–4.6)
Alkaline Phosphatase: 92 IU/L (ref 39–117)
BUN/Creatinine Ratio: 21 (ref 12–28)
BUN: 25 mg/dL (ref 8–27)
Bilirubin Total: 0.3 mg/dL (ref 0.0–1.2)
CO2: 27 mmol/L (ref 20–29)
Calcium: 9.4 mg/dL (ref 8.7–10.3)
Chloride: 99 mmol/L (ref 96–106)
Creatinine, Ser: 1.21 mg/dL — ABNORMAL HIGH (ref 0.57–1.00)
GFR calc Af Amer: 47 mL/min/{1.73_m2} — ABNORMAL LOW (ref >59)
GFR calc non Af Amer: 41 mL/min/{1.73_m2} — ABNORMAL LOW (ref >59)
Globulin, Total: 2.7 g/dL (ref 1.5–4.5)
Glucose: 76 mg/dL (ref 65–99)
Potassium: 3.8 mmol/L (ref 3.5–5.2)
Sodium: 142 mmol/L (ref 134–144)
Total Protein: 6.9 g/dL (ref 6.0–8.5)

## 2019-03-16 LAB — HEPATIC FUNCTION PANEL: Bilirubin, Direct: 0.12 mg/dL (ref 0.00–0.40)

## 2019-03-18 ENCOUNTER — Telehealth: Payer: Self-pay | Admitting: Cardiology

## 2019-03-18 DIAGNOSIS — I4891 Unspecified atrial fibrillation: Secondary | ICD-10-CM

## 2019-03-18 NOTE — Telephone Encounter (Signed)
Pt stated that during her Virtual Visit with Dr. Percival Spanish ~10 days ago, they talked about putting the pt on a medication if her heart would continue to race. She wanted to wait, but her heart rate after activity gets up into the 130s and stays there for about 5 minutes. She also feels like her heart rate is irregular.

## 2019-03-18 NOTE — Telephone Encounter (Signed)
Spoke with pt for long time and still notes elevated HR ranging from 78-146  These readings are from pulse ox and also  checks B/P every day for few days and then skips some days and HR readings show 77-91  Also noted  1 reading of 53 but didn't think that was accurate. Per last office note discussed maybe trying Digoxin  Per pt feels okay at this time played 18 holes of golf yesterday Will forward to Dr Percival Spanish for review and recommendations ./cy

## 2019-03-19 ENCOUNTER — Telehealth: Payer: Self-pay | Admitting: Pulmonary Disease

## 2019-03-19 NOTE — Telephone Encounter (Signed)
Labs are stable.  No changes.  Wyn Quaker, FNP

## 2019-03-19 NOTE — Telephone Encounter (Signed)
Called and spoke with pt letting her know the results of her labwork. Pt expressed understanding. Nothing further needed.

## 2019-03-19 NOTE — Telephone Encounter (Signed)
Holter monitor ordered and message send to Markus Daft and Katrina Bowmen to contact pt

## 2019-03-19 NOTE — Telephone Encounter (Signed)
Pt had labs performed at [facility] and labs were done there that were ordered by Dr. [name] Browner.  Since the labs were ordered by Dr. [name] Browner of CMET and Hepatic Panel, pt was told that she would have to receive the results of lab tests by our office since these were per Dr. [name] Browner for therapeutic drug monitoring of OFEV.  Aaron Edelman, please advise on the results of labs for pt. Thanks!

## 2019-03-19 NOTE — Telephone Encounter (Signed)
If she feels OK I likely won't add the digoxin just yet.  I would like for her to wear a 24 hour Holter

## 2019-03-23 DIAGNOSIS — R7989 Other specified abnormal findings of blood chemistry: Secondary | ICD-10-CM | POA: Diagnosis not present

## 2019-03-23 DIAGNOSIS — M5030 Other cervical disc degeneration, unspecified cervical region: Secondary | ICD-10-CM | POA: Diagnosis not present

## 2019-03-23 DIAGNOSIS — M5136 Other intervertebral disc degeneration, lumbar region: Secondary | ICD-10-CM | POA: Diagnosis not present

## 2019-03-23 DIAGNOSIS — M0579 Rheumatoid arthritis with rheumatoid factor of multiple sites without organ or systems involvement: Secondary | ICD-10-CM | POA: Diagnosis not present

## 2019-03-23 DIAGNOSIS — M150 Primary generalized (osteo)arthritis: Secondary | ICD-10-CM | POA: Diagnosis not present

## 2019-03-25 ENCOUNTER — Telehealth: Payer: Self-pay | Admitting: *Deleted

## 2019-03-25 NOTE — Telephone Encounter (Signed)
[employer] to mail ZIO XT long term holter monitor to patients home.  Patient to wear 1-3 days, then mail back to [facility] in [employer].  Instructions reviewed briefly as the are included in the monitor kit.

## 2019-03-30 ENCOUNTER — Ambulatory Visit (INDEPENDENT_AMBULATORY_CARE_PROVIDER_SITE_OTHER): Payer: Medicare Other

## 2019-03-30 DIAGNOSIS — I4891 Unspecified atrial fibrillation: Secondary | ICD-10-CM

## 2019-04-09 ENCOUNTER — Telehealth: Payer: Self-pay

## 2019-04-09 DIAGNOSIS — I4891 Unspecified atrial fibrillation: Secondary | ICD-10-CM | POA: Diagnosis not present

## 2019-04-09 NOTE — Telephone Encounter (Signed)

## 2019-04-13 ENCOUNTER — Other Ambulatory Visit: Payer: Self-pay

## 2019-04-13 NOTE — Progress Notes (Signed)
Virtual Visit via Video Note   This visit type was conducted due to national recommendations for restrictions regarding the COVID-19 Pandemic (e.g. social distancing) in an effort to limit this patient's exposure and mitigate transmission in our community.  Due to her co-morbid illnesses, this patient is at least at moderate risk for complications without adequate follow up.  This format is felt to be most appropriate for this patient at this time.  All issues noted in this document were discussed and addressed.  A limited physical exam was performed with this format.  Please refer to the patient's chart for her consent to telehealth for [facility].   Evaluation Performed:  Follow-up visit  Date:  04/14/2019   ID:  Susan Fitzgerald, Susan Fitzgerald Apr 22, 1932, MRN [id number]  Patient Location: Home  Provider Location: Home  PCP:  Chipper Herb, MD  Cardiologist:  Minus Breeding, MD  Electrophysiologist:  None   Chief Complaint:  Irregular heart beat  History of Present Illness:    Susan Fitzgerald is a 85 y.o. female who presents via audio/video conferencing for a telehealth visit today.    The patient presents for evaluation of atrial fibrillation. She was cardioverted. However, she developed recurrent atrial fib. She had her flecainide does reduced because of a prolonged QT interval. The flecainide was subsequently discontinued as she had reverted to atrial fib. She has been followed in the atrial fib clinic. She did describe chest pain and was sent for [facility] which did not suggest ischemia. She does have a mildly reduced EF (40 - 45% on echo.) Her last echo in March 2019 demonstrated an improved EF of 55 - 60%. She was started on amiodarone and underwent DCCV. She called recently with epidsodes of weak spells. She has been in and out of fib but she does not think that this correlates with her sensation. She was told over the phone to reduce her pindolol. Last year she had  increased dyspnea and decreased O2 sats. She was treated for pneumonia. She was referred to pulmonary. A CT was done. There was a thought that she might have an amiodarone pulmonary process. Other processes could not be excluded such as a reaction to her methotrexate. There was also suggestion of pulmonary edema. She has been treated with diuresis. She did have an increased creat but this is improved. She was treated with steroids and did improve.   She has not been told that she has idiopathic idiopathic pulmonary fibrosis.  She wore a Holter.  I reviewed this today.  Her rate is elevated and she is 100% atrial fibrillation.  She comes down to the 90s when she is sleeping.  However, she is 120s to 140s if she is up and moving around.  She feels her heart beating hard sometimes but is not really bothering her.  She is not having any new shortness of breath, PND or orthopnea.  She is not having any new weakness, presyncope or syncope.  She was able to go out and do a little golfing once she wore this monitor.  She has a chronic dyspnea related to her chronic lung disease.  The patient does not symptoms concerning for COVID-19 infection (fever, chills, cough, or new SHORTNESS OF BREATH).   Past Medical History:  Diagnosis Date   Arthritis    Bradycardia 2014   Calcification of bronchial airway 06/23/2018   01/2017 - See on CT imaging of the chest    Cataract    Cerebral  vascular disease 06/19/2018   06/19/2018 - MRI with chronic microvascular ischemic change   Diverticulosis    Dyspnea    periodically   Dysrhythmia    a - fib   Heart murmur    ??? bruit   History of stress test    a. 04/09/13-nuclear stress-no ischemia low risk   Hypertension    Menopause    Osteoporosis    PAF (paroxysmal atrial fibrillation) ([place])    a. s/p prior DCCV; b. On flecainide & coumadin (CHA2DS2VASc = 4);  c. 03/2016 Echo: EF 55-60%, mod LVH, mod AI, mild to mod TR, PASP 16mmHg.   Pelvic  fracture ([place])    Past Surgical History:  Procedure Laterality Date   BIOPSY BREAST     BIOPSY BREAST     right & benign   BREAST SURGERY     CARDIOVERSION N/A 10/30/2016   Procedure: CARDIOVERSION;  Surgeon: Larey Dresser, MD;  Location: [place];  Service: Cardiovascular;  Laterality: N/A;   CARDIOVERSION N/A 08/20/2017   Procedure: CARDIOVERSION;  Surgeon: Jerline Pain, MD;  Location: [place] [facility];  Service: Cardiovascular;  Laterality: N/A;   CATARACT EXTRACTION     EYE SURGERY     cataracts   TEE WITHOUT CARDIOVERSION N/A 10/30/2016   Procedure: TRANSESOPHAGEAL ECHOCARDIOGRAM (TEE);  Surgeon: Larey Dresser, MD;  Location: [place];  Service: Cardiovascular;  Laterality: N/A;   TOTAL HIP ARTHROPLASTY Right 01/27/2017   Procedure: TOTAL HIP ARTHROPLASTY ANTERIOR APPROACH;  Surgeon: Rod Can, MD;  Location: [place];  Service: Orthopedics;  Laterality: Right;     Current Meds  Medication Sig   apixaban (ELIQUIS) 2.5 MG TABS tablet Take 2.5 mg by mouth 2 (two) times daily.    calcium-vitamin D (OSCAL WITH D) 250-125 MG-UNIT per tablet Take 1 tablet by mouth 2 (two) times daily.   diltiazem (CARDIZEM CD) 240 MG 24 hr capsule Take 1 capsule (240 mg total) by mouth daily.   folic acid (FOLVITE) 1 MG tablet Take 1 mg by mouth daily.    furosemide (LASIX) 20 MG tablet Take 1 tablet (20 mg total) by mouth daily.   gabapentin (NEURONTIN) 100 MG capsule Take 100 mg by mouth at bedtime.    levothyroxine (SYNTHROID, LEVOTHROID) 75 MCG tablet TAKE 1 TABLET BY MOUTH  DAILY   lisinopril (PRINIVIL,ZESTRIL) 20 MG tablet Take 1 tablet (20 mg total) by mouth daily. (Patient taking differently: Take 20 mg by mouth every evening. )   lisinopril-hydrochlorothiazide (PRINZIDE,ZESTORETIC) 20-12.5 MG tablet Take 1 tablet by mouth daily. (Patient taking differently: Take 1 tablet by mouth every morning. )   Melatonin 5 MG TABS Take 5 mg by mouth at bedtime.   Multiple  Vitamin (MULTIVITAMIN) capsule Take 1 capsule by mouth daily.   OFEV 150 MG CAPS Take 1 capsule (150 mg total) by mouth 2 (two) times daily.   potassium chloride SA (KLOR-CON M20) 20 MEQ tablet Take 1 tablet (20 mEq total) by mouth daily.   Current Facility-Administered Medications for the 04/14/19 encounter (Telemedicine) with Minus Breeding, MD  Medication   cyanocobalamin ((VITAMIN B-12)) injection 1,000 mcg     Allergies:   Benazepril hcl   Social History   Tobacco Use   Smoking status: Never Smoker   Smokeless tobacco: Never Used  Substance Use Topics   Alcohol use: No   Drug use: No     Family Hx: The patient's family history includes Cancer in her brother, daughter, and maternal aunt; Heart attack in  her brother and father; Heart disease in her brother, brother, and father; Stroke in her mother; Vision loss in her father.  ROS:   Please see the history of present illness.    Positive for chronic SOB Otherwise as stated in the HPI and negative for all other systems.  Prior CV studies:   The following studies were reviewed today:   Labs/Other Tests and Data Reviewed:    EKG:  No ECG reviewed.  Recent Labs: 06/16/2018: BNP 175.8 11/06/2018: Hemoglobin 11.4; Platelets 177 11/13/2018: TSH 0.746 03/15/2019: ALT 20; BUN 25; Creatinine, Ser 1.21; Potassium 3.8; Sodium 142   Recent Lipid Panel Lab Results  Component Value Date/Time   CHOL 176 12/09/2018 04:35 PM   CHOL 131 04/14/2013 08:36 AM   TRIG 74 12/09/2018 04:35 PM   TRIG 67 09/15/2017 08:45 AM   TRIG 69 04/14/2013 08:36 AM   HDL 63 12/09/2018 04:35 PM   HDL 71 09/15/2017 08:45 AM   HDL 53 04/14/2013 08:36 AM   CHOLHDL 2.8 12/09/2018 04:35 PM   LDLCALC 98 12/09/2018 04:35 PM   LDLCALC 64 04/14/2013 08:36 AM   LDLDIRECT 98 04/20/2014 10:15 AM    Wt Readings from Last 3 Encounters:  04/14/19 117 lb 12.8 oz (53.4 kg)  02/23/19 117 lb (53.1 kg)  02/12/19 118 lb 9.6 oz (53.8 kg)     Objective:      Vital Signs:  BP 129/82    Pulse 79    Temp 98 F (36.7 C)    Ht 5' (1.524 m)    Wt 117 lb 12.8 oz (53.4 kg)    BMI 23.01 kg/m    NA   ASSESSMENT & PLAN:     HTN:   This will be managed in the context of treating her atrial fib ventricular rate.    ATRIAL FIB:   Heart rate is elevated.  And then increase the Cardizem to 300 mg daily.  Probably need digoxin added next.  We talked about how to keep a blood pressure and heart rate diary for me so I can continue to make med titrations.  Susan S Vaughnhas a CHA2DS2 - VASc score of 4 with a risk of stroke of 4%.    She will continue on her current meds including anticoagulation  DYSPNEA: She has idiopathic pulmonary fibrosis.  This is unchanged.  No change in therapy.  COVID-19 Education: The signs and symptoms of COVID-19 were discussed with the patient and how to seek care for testing (follow up with PCP or arrange E-visit).  The importance of social distancing was discussed today.  She wants to go golfing with her girlfriends we discussed how she would still socially distance.  Time:   Today, I have spent 69minutes with the patient with telehealth technology discussing the above problems.     Medication Adjustments/Labs and Tests Ordered: Current medicines are reviewed at length with the patient today.  Concerns regarding medicines are outlined above.   Tests Ordered: No orders of the defined types were placed in this encounter.  Medication Changes: No orders of the defined types were placed in this encounter.   Disposition:  Follow up me in 6 weeks.   Signed, Minus Breeding, MD  04/14/2019 10:23 AM    [facility]

## 2019-04-14 ENCOUNTER — Telehealth (INDEPENDENT_AMBULATORY_CARE_PROVIDER_SITE_OTHER): Payer: Medicare Other | Admitting: Cardiology

## 2019-04-14 ENCOUNTER — Encounter: Payer: Self-pay | Admitting: Cardiology

## 2019-04-14 ENCOUNTER — Ambulatory Visit (INDEPENDENT_AMBULATORY_CARE_PROVIDER_SITE_OTHER): Payer: Medicare Other | Admitting: *Deleted

## 2019-04-14 ENCOUNTER — Ambulatory Visit: Payer: Medicare Other

## 2019-04-14 ENCOUNTER — Other Ambulatory Visit: Payer: Self-pay

## 2019-04-14 VITALS — BP 129/82 | HR 79 | Temp 98.0°F | Ht 60.0 in | Wt 117.8 lb

## 2019-04-14 DIAGNOSIS — I4891 Unspecified atrial fibrillation: Secondary | ICD-10-CM

## 2019-04-14 DIAGNOSIS — L659 Nonscarring hair loss, unspecified: Secondary | ICD-10-CM | POA: Diagnosis not present

## 2019-04-14 DIAGNOSIS — E538 Deficiency of other specified B group vitamins: Secondary | ICD-10-CM

## 2019-04-14 MED ORDER — DILTIAZEM HCL ER COATED BEADS 300 MG PO CP24: 300.0000 mg | ORAL_CAPSULE | Freq: Every day | ORAL | 3 refills | Status: DC

## 2019-04-14 NOTE — Progress Notes (Signed)
Pt given cyanocobalamin inj Tolerated well 

## 2019-04-14 NOTE — Patient Instructions (Addendum)
Medication Instructions:  Please increase your Cardizem to 300 mg daily. Continue all other medications as listed.  If you need a refill on your cardiac medications before your next appointment, please call your pharmacy.   Follow-Up: Please follow up with Dr Percival Spanish in 6 weeks by virtual visit as scheduled.  Thank you for choosing [facility]!!

## 2019-04-16 ENCOUNTER — Telehealth: Payer: Self-pay | Admitting: Pulmonary Disease

## 2019-04-16 DIAGNOSIS — Z5181 Encounter for therapeutic drug level monitoring: Secondary | ICD-10-CM

## 2019-04-16 DIAGNOSIS — J849 Interstitial pulmonary disease, unspecified: Secondary | ICD-10-CM

## 2019-04-16 NOTE — Telephone Encounter (Signed)
Spoke with patient. She stated that she is seeing her PCP on Monday and will labs drawn on Tuesday. She wanted to know if she needed to have any additional labs drawn for Dr. [name] Browner as she is currently on Ofev 150mg . Advised her I would check, she verbalized understanding.   She also stated that she has been on Ofev 150mg  for about 2 months now. She has started to experience nausea. This occurs about every other day around 1230. Per patient, it is not extreme. Denied having any diarrhea or any other side effects. She just wanted to let someone know.   She last had LFTs and CMET on 03/15/19.   Kenney Houseman, can you please advise if she will need to have these done again for therapeutic drug monitoring? Thanks!

## 2019-04-16 NOTE — Telephone Encounter (Signed)
Called and spoke with pt letting her know that we were going to order lft labwork for her and that she should be able to get this done by PCP and pt verbalized understanding. Nothing further needed.

## 2019-04-16 NOTE — Telephone Encounter (Signed)
She can get her LFT's rechecked if not already ordered. Nausea is common. Please let us know if it gets worse.

## 2019-04-19 ENCOUNTER — Encounter: Payer: Self-pay | Admitting: Family Medicine

## 2019-04-19 ENCOUNTER — Ambulatory Visit (INDEPENDENT_AMBULATORY_CARE_PROVIDER_SITE_OTHER): Payer: Medicare Other | Admitting: Family Medicine

## 2019-04-19 ENCOUNTER — Other Ambulatory Visit: Payer: Self-pay

## 2019-04-19 DIAGNOSIS — E039 Hypothyroidism, unspecified: Secondary | ICD-10-CM | POA: Diagnosis not present

## 2019-04-19 DIAGNOSIS — E538 Deficiency of other specified B group vitamins: Secondary | ICD-10-CM | POA: Diagnosis not present

## 2019-04-19 DIAGNOSIS — I4891 Unspecified atrial fibrillation: Secondary | ICD-10-CM | POA: Diagnosis not present

## 2019-04-19 DIAGNOSIS — I10 Essential (primary) hypertension: Secondary | ICD-10-CM | POA: Diagnosis not present

## 2019-04-19 DIAGNOSIS — N184 Chronic kidney disease, stage 4 (severe): Secondary | ICD-10-CM

## 2019-04-19 DIAGNOSIS — J849 Interstitial pulmonary disease, unspecified: Secondary | ICD-10-CM

## 2019-04-19 DIAGNOSIS — E559 Vitamin D deficiency, unspecified: Secondary | ICD-10-CM

## 2019-04-19 DIAGNOSIS — M459 Ankylosing spondylitis of unspecified sites in spine: Secondary | ICD-10-CM

## 2019-04-19 DIAGNOSIS — I700 Atherosclerosis of aorta: Secondary | ICD-10-CM

## 2019-04-19 DIAGNOSIS — I482 Chronic atrial fibrillation, unspecified: Secondary | ICD-10-CM

## 2019-04-19 NOTE — Addendum Note (Signed)
Addended by: Zannie Cove on: 04/19/2019 01:54 PM   Modules accepted: Orders

## 2019-04-19 NOTE — Patient Instructions (Addendum)
Continue to practice good hand and respiratory hygiene Continue to stay active and get as much exercise as possible i.e. playing golf Drink plenty of water and fluids and stay well-hydrated Continue to follow-up with respiratory specialist, Dr. Valeta Harms, cardiologist, Dr. Percival Spanish, and rheumatologist, Dr. Melissa Noon group. Continue to get B12 shots regularly Continue to monitor blood pressures at home and weights at home Keep blood pressure and heart rate diary as recommended by Dr. Archie Patten so that when you see him in about 6 weeks he can make any further adjustments that he may need to make with your medications at that time.

## 2019-04-19 NOTE — Progress Notes (Signed)
Virtual Visit Via telephone Note I connected with@ on 04/19/19 by telephone and verified that I am speaking with the correct person or authorized healthcare agent using two identifiers. Susan Fitzgerald is currently located at home and there are no unauthorized people in close proximity. I completed this visit while in a private location in my home .  This visit type was conducted due to national recommendations for restrictions regarding the COVID-19 Pandemic (e.g. social distancing).  This format is felt to be most appropriate for this patient at this time.  All issues noted in this document were discussed and addressed.  No physical exam was performed.    I discussed the limitations, risks, security and privacy concerns of performing an evaluation and management service by telephone and the availability of in person appointments. I also discussed with the patient that there may be a patient responsible charge related to this service. The patient expressed understanding and agreed to proceed.   Date:  04/19/2019    ID:  [name]      1932/04/25        [id number]   Patient Care Team Patient Care Team: Chipper Herb, MD as PCP - General (Family Medicine) Minus Breeding, MD as PCP - Cardiology (Cardiology) Druscilla Brownie, MD as Consulting Physician (Dermatology) Hennie Duos, MD as Consulting Physician (Rheumatology) Marlaine Hind, MD as Consulting Physician (Physical Medicine and Rehabilitation)  Reason for Visit: Primary Care Follow-up     History of Present Illness & Review of Systems:     Susan Fitzgerald is a 85 y.o. year old female primary care patient that presents today for a telehealth visit.  Patient is alert pleasant and doing well.  She denies any chest pain pressure tightness.  She does have some shortness of breath at times.  Not any worsening in this.  She says her stomach is doing well and actually is tending toward being a little bit constipated.  She has  some nausea occasionally and thinks this may be due to some medicine that she is taking from the pulmonologist.  She has not seen any blood in the stool or had any black tarry bowel movements.  She is passing her water well.  She has minimal edema.  She has had the nausea that seems to be occasional.  She is followed regularly by the cardiologist the pulmonologist and the rheumatologist.  She sees a cardiologist again in about 6 weeks.  He is monitoring her heart rate and blood pressure while trying to make some adjustments with her medication.  She does have activity of playing golf and seems to do well with this.  Review of systems as stated, otherwise negative.  The patient does not have symptoms concerning for COVID-19 infection (fever, chills, cough, or new shortness of breath).      Current Medications (Verified) Allergies as of 04/19/2019      Reactions   Benazepril Hcl Cough      Medication List       Accurate as of April 19, 2019  9:59 AM. If you have any questions, ask your nurse or doctor.        azaTHIOprine 50 MG tablet Commonly known as:  IMURAN Take 50 mg by mouth daily.   calcium-vitamin D 250-125 MG-UNIT tablet Commonly known as:  OSCAL WITH D Take 1 tablet by mouth 2 (two) times daily.   diltiazem 300 MG 24 hr capsule Commonly known as:  CARDIZEM CD  Take 1 capsule (300 mg total) by mouth daily.   Eliquis 2.5 MG Tabs tablet Generic drug:  apixaban Take 2.5 mg by mouth 2 (two) times daily.   folic acid 1 MG tablet Commonly known as:  FOLVITE Take 1 mg by mouth daily.   furosemide 20 MG tablet Commonly known as:  LASIX Take 1 tablet (20 mg total) by mouth daily.   gabapentin 100 MG capsule Commonly known as:  NEURONTIN Take 100 mg by mouth at bedtime.   levothyroxine 75 MCG tablet Commonly known as:  SYNTHROID TAKE 1 TABLET BY MOUTH  DAILY   lisinopril 20 MG tablet Commonly known as:  ZESTRIL Take 1 tablet (20 mg total) by mouth daily. What changed:   when to take this   lisinopril-hydrochlorothiazide 20-12.5 MG tablet Commonly known as:  ZESTORETIC Take 1 tablet by mouth daily. What changed:  when to take this   Melatonin 5 MG Tabs Take 5 mg by mouth at bedtime.   multivitamin capsule Take 1 capsule by mouth daily.   Ofev 150 MG Caps Generic drug:  Nintedanib Take 1 capsule (150 mg total) by mouth 2 (two) times daily.   potassium chloride SA 20 MEQ tablet Commonly known as:  [place]-Con M20 Take 1 tablet (20 mEq total) by mouth daily.   VITAMIN B-12 IJ Inject once a month           Allergies (Verified)    Benazepril hcl  Past Medical History Past Medical History:  Diagnosis Date  . Arthritis   . Bradycardia 2014  . Calcification of bronchial airway 06/23/2018   01/2017 - See on CT imaging of the chest   . Cataract   . Cerebral vascular disease 06/19/2018   06/19/2018 - MRI with chronic microvascular ischemic change  . Diverticulosis   . Dyspnea    periodically  . Dysrhythmia    a - fib  . Heart murmur    ??? bruit  . History of stress test    a. 04/09/13-nuclear stress-no ischemia low risk  . Hypertension   . Menopause   . Osteoporosis   . PAF (paroxysmal atrial fibrillation) ([place])    a. s/p prior DCCV; b. On flecainide & coumadin (CHA2DS2VASc = 4);  c. 03/2016 Echo: EF 55-60%, mod LVH, mod AI, mild to mod TR, PASP 24mmHg.  Marland Kitchen Pelvic fracture [facility])      Past Surgical History:  Procedure Laterality Date  . BIOPSY BREAST    . BIOPSY BREAST     right & benign  . BREAST SURGERY    . CARDIOVERSION N/A 10/30/2016   Procedure: CARDIOVERSION;  Surgeon: Larey Dresser, MD;  Location: [place];  Service: Cardiovascular;  Laterality: N/A;  . CARDIOVERSION N/A 08/20/2017   Procedure: CARDIOVERSION;  Surgeon: Jerline Pain, MD;  Location[facility];  Service: Cardiovascular;  Laterality: N/A;  . CATARACT EXTRACTION    . EYE SURGERY     cataracts  . TEE WITHOUT CARDIOVERSION N/A 10/30/2016   Procedure:  TRANSESOPHAGEAL ECHOCARDIOGRAM (TEE);  Surgeon: Larey Dresser, MD;  Location: [place];  Service: Cardiovascular;  Laterality: N/A;  . TOTAL HIP ARTHROPLASTY Right 01/27/2017   Procedure: TOTAL HIP ARTHROPLASTY ANTERIOR APPROACH;  Surgeon: Rod Can, MD;  Location: [place];  Service: Orthopedics;  Laterality: Right;    Social History   Socioeconomic History  . Marital status: Married    Spouse name: Rush Landmark   . Number of children: 2  . Years of education: Not on file  .  Highest education level: Not on file  Occupational History  . Not on file  Social Needs  . Financial resource strain: Not hard at all  . Food insecurity:    Worry: Never true    Inability: Never true  . Transportation needs:    Medical: No    Non-medical: No  Tobacco Use  . Smoking status: Never Smoker  . Smokeless tobacco: Never Used  Substance and Sexual Activity  . Alcohol use: No  . Drug use: No  . Sexual activity: Yes  Lifestyle  . Physical activity:    Days per week: 2 days    Minutes per session: 60 min  . Stress: Only a little  Relationships  . Social connections:    Talks on phone: More than three times a week    Gets together: More than three times a week    Attends religious service: More than 4 times per year    Active member of club or organization: Yes    Attends meetings of clubs or organizations: More than 4 times per year    Relationship status: Married  Other Topics Concern  . Not on file  Social History Narrative  . Not on file     Family History  Problem Relation Age of Onset  . Stroke Mother        cerebral hemorrhage  . Heart disease Father        MI  . Heart attack Father   . Vision loss Father   . Heart disease Brother   . Heart attack Brother   . Cancer Maternal Aunt        breast  . Cancer Brother   . Heart disease Brother   . Cancer Daughter        breast      Labs/Other Tests and Data Reviewed:    Wt Readings from Last 3 Encounters:  04/14/19 117  lb 12.8 oz (53.4 kg)  02/23/19 117 lb (53.1 kg)  02/12/19 118 lb 9.6 oz (53.8 kg)   Temp Readings from Last 3 Encounters:  04/14/19 98 F (36.7 C)  02/11/19 98.3 F (36.8 C) (Oral)  12/09/18 (!) 97 F (36.1 C) (Oral)   BP Readings from Last 3 Encounters:  04/14/19 129/82  02/23/19 132/77  02/12/19 110/66   Pulse Readings from Last 3 Encounters:  04/14/19 79  02/23/19 79  02/12/19 70     Lab Results  Component Value Date   HGBA1C 5.0 12/04/2018   Lab Results  Component Value Date   LDLCALC 98 12/09/2018   CREATININE 1.21 (H) 03/15/2019       Chemistry      Component Value Date/Time   NA 142 03/15/2019 1500   K 3.8 03/15/2019 1500   CL 99 03/15/2019 1500   CO2 27 03/15/2019 1500   BUN 25 03/15/2019 1500   CREATININE 1.21 (H) 03/15/2019 1500   CREATININE 0.89 05/31/2013 1148      Component Value Date/Time   CALCIUM 9.4 03/15/2019 1500   ALKPHOS 92 03/15/2019 1500   AST 32 03/15/2019 1500   ALT 20 03/15/2019 1500   BILITOT 0.3 03/15/2019 1500         OBSERVATIONS/ OBJECTIVE:     The patient was alert and relate her history well to me.  Her weight was 117.8 pounds her blood pressure has been running in the 130/82 range or less.  Her pulse rate at home was 75 but varies often between 65 and 130  range.  She feels well when she is out and more active.  She continues to practice good hand and respiratory hygiene.  She has multiple medical problems which are being managed by multiple specialists.  She does take over-the-counter biotin collagen 2500/100.  She is taking 2 daily.  Physical exam deferred due to nature of telephonic visit.  ASSESSMENT & PLAN    Time:   Today, I have spent 34 minutes with the patient via telephone discussing the above including Covid precautions.     Visit Diagnoses: 1. B12 deficiency -Continue with B12 injections  2. Essential hypertension -Continue to monitor blood pressure regularly along with pulse rates  3. CKD  (chronic kidney disease) stage 4, GFR 15-29 ml/min (HCC) -Continue to avoid NSAIDs drink plenty of water and fluids  4. Atrial fibrillation, unspecified type [facility]) -Follow-up with cardiology regularly and work closely with Dr. Percival Spanish  5. Hypothyroidism, unspecified type -Continue with current treatment and check thyroid profile with next blood work  6. Aortic atherosclerosis ([place]) -NU with aggressive therapeutic lifestyle changes and follow-up with cardiology as planned  7. Atrial fibrillation, chronic -Follow-up with cardiology as planned  8. 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 Amil Amen as planned  9. Interstitial lung disease ([place]) -Follow-up with pulmonology as planned  Patient Instructions  Continue to practice good hand and respiratory hygiene Continue to stay active and get as much exercise as possible i.e. playing golf Drink plenty of water and fluids and stay well-hydrated Continue to follow-up with respiratory specialist, Dr. Valeta Harms, cardiologist, Dr. Percival Spanish, and rheumatologist, Dr. Melissa Noon group. Continue to get B12 shots regularly Continue to monitor blood pressures at home and weights at home Keep blood pressure and heart rate diary as recommended by Dr. Archie Patten so that when you see him in about 6 weeks he can make any further adjustments that he may need to make with your medications at that time.  Make sure patient gets her routine mammograms when needed     The above assessment and management plan was discussed with the patient. The patient verbalized understanding of and has agreed to the management plan. Patient is aware to call the clinic if symptoms persist or worsen. Patient is aware when to return to the clinic for a follow-up visit. Patient educated on when it is appropriate to go to the emergency department.    Chipper Herb, MD [address],  [place], [place] [address] Ph [phone]   Arrie Senate MD

## 2019-04-20 ENCOUNTER — Other Ambulatory Visit: Payer: Self-pay

## 2019-04-20 ENCOUNTER — Other Ambulatory Visit: Payer: Medicare Other

## 2019-04-20 DIAGNOSIS — E538 Deficiency of other specified B group vitamins: Secondary | ICD-10-CM

## 2019-04-20 DIAGNOSIS — M459 Ankylosing spondylitis of unspecified sites in spine: Secondary | ICD-10-CM

## 2019-04-20 DIAGNOSIS — E559 Vitamin D deficiency, unspecified: Secondary | ICD-10-CM | POA: Diagnosis not present

## 2019-04-20 DIAGNOSIS — I700 Atherosclerosis of aorta: Secondary | ICD-10-CM | POA: Diagnosis not present

## 2019-04-20 DIAGNOSIS — E039 Hypothyroidism, unspecified: Secondary | ICD-10-CM

## 2019-04-20 DIAGNOSIS — I4891 Unspecified atrial fibrillation: Secondary | ICD-10-CM

## 2019-04-20 DIAGNOSIS — J849 Interstitial pulmonary disease, unspecified: Secondary | ICD-10-CM

## 2019-04-20 DIAGNOSIS — I482 Chronic atrial fibrillation, unspecified: Secondary | ICD-10-CM

## 2019-04-20 DIAGNOSIS — N184 Chronic kidney disease, stage 4 (severe): Secondary | ICD-10-CM

## 2019-04-20 DIAGNOSIS — I10 Essential (primary) hypertension: Secondary | ICD-10-CM

## 2019-04-20 LAB — LIPID PANEL

## 2019-04-21 ENCOUNTER — Ambulatory Visit: Payer: Medicare Other | Admitting: Pulmonary Disease

## 2019-04-21 LAB — BMP8+EGFR
BUN/Creatinine Ratio: 19 (ref 12–28)
BUN: 25 mg/dL (ref 8–27)
CO2: 25 mmol/L (ref 20–29)
Calcium: 9.5 mg/dL (ref 8.7–10.3)
Chloride: 100 mmol/L (ref 96–106)
Creatinine, Ser: 1.33 mg/dL — ABNORMAL HIGH (ref 0.57–1.00)
GFR calc Af Amer: 42 mL/min/{1.73_m2} — ABNORMAL LOW (ref >59)
GFR calc non Af Amer: 36 mL/min/{1.73_m2} — ABNORMAL LOW (ref >59)
Glucose: 84 mg/dL (ref 65–99)
Potassium: 3.6 mmol/L (ref 3.5–5.2)
Sodium: 143 mmol/L (ref 134–144)

## 2019-04-21 LAB — CBC WITH DIFFERENTIAL/PLATELET
Basophils Absolute: 0.1 10*3/uL (ref 0.0–0.2)
Basos: 1 %
EOS (ABSOLUTE): 0.2 10*3/uL (ref 0.0–0.4)
Eos: 4 %
Hematocrit: 36.4 % (ref 34.0–46.6)
Hemoglobin: 12.3 g/dL (ref 11.1–15.9)
Immature Grans (Abs): 0 10*3/uL (ref 0.0–0.1)
Immature Granulocytes: 0 %
Lymphocytes Absolute: 1.6 10*3/uL (ref 0.7–3.1)
Lymphs: 35 %
MCH: 32.4 pg (ref 26.6–33.0)
MCHC: 33.8 g/dL (ref 31.5–35.7)
MCV: 96 fL (ref 79–97)
Monocytes Absolute: 0.6 10*3/uL (ref 0.1–0.9)
Monocytes: 12 %
Neutrophils Absolute: 2.2 10*3/uL (ref 1.4–7.0)
Neutrophils: 48 %
Platelets: 165 10*3/uL (ref 150–450)
RBC: 3.8 x10E6/uL (ref 3.77–5.28)
RDW: 13.2 % (ref 11.7–15.4)
WBC: 4.6 10*3/uL (ref 3.4–10.8)

## 2019-04-21 LAB — THYROID PANEL WITH TSH
Free Thyroxine Index: 2.8 (ref 1.2–4.9)
T3 Uptake Ratio: 31 % (ref 24–39)
T4, Total: 9.1 ug/dL (ref 4.5–12.0)
TSH: 1.52 u[IU]/mL (ref 0.450–4.500)

## 2019-04-21 LAB — HEPATIC FUNCTION PANEL
ALT: 18 IU/L (ref 0–32)
AST: 29 IU/L (ref 0–40)
Albumin: 4.1 g/dL (ref 3.6–4.6)
Alkaline Phosphatase: 78 IU/L (ref 39–117)
Bilirubin Total: 0.4 mg/dL (ref 0.0–1.2)
Bilirubin, Direct: 0.14 mg/dL (ref 0.00–0.40)
Total Protein: 6.6 g/dL (ref 6.0–8.5)

## 2019-04-21 LAB — LIPID PANEL
Chol/HDL Ratio: 2.2 ratio (ref 0.0–4.4)
Cholesterol, Total: 153 mg/dL (ref 100–199)
HDL: 71 mg/dL (ref >39)
LDL Calculated: 69 mg/dL (ref 0–99)
Triglycerides: 66 mg/dL (ref 0–149)
VLDL Cholesterol Cal: 13 mg/dL (ref 5–40)

## 2019-04-21 LAB — VITAMIN D 25 HYDROXY (VIT D DEFICIENCY, FRACTURES): Vit D, 25-Hydroxy: 49.4 ng/mL (ref 30.0–100.0)

## 2019-04-21 LAB — VITAMIN B12: Vitamin B-12: >2000 pg/mL — ABNORMAL HIGH (ref 232–1245)

## 2019-04-28 ENCOUNTER — Ambulatory Visit: Payer: Medicare Other | Admitting: Cardiology

## 2019-04-28 ENCOUNTER — Telehealth: Payer: Self-pay | Admitting: Pulmonary Disease

## 2019-04-28 NOTE — Telephone Encounter (Signed)
She takes a lot of meds. Ask her if she is taking the morning dose with a lot of other medications at the same time. If she is have her wait a few hours after the other meds she takes and eat a snack and take it at 10 am. Lets see if this will help. Thanks

## 2019-04-28 NOTE — Telephone Encounter (Signed)
Called pt and advised message from the provider. Pt understood and verbalized understanding. Nothing further is needed.    

## 2019-04-28 NOTE — Telephone Encounter (Signed)
Called the patient back. She stated she started [facility] 02/19/19. Had the nausea back then, but it usually would pass. But in the last 2 or 3 weeks she now has been vomiting usually between 11:30 am to 12:00 pm after she has taken the medication in the morning at 8:30 or 9:00 with her breakfast. But the vomiting does not happen in the evening after eating dinner and taking the second dosage for the day.  Patient stated she takes several medications, but noticed this issue after taking the OFEV. Patient stated she knows she needs to take the medication because of the CT results and the benefits from taking the medication. But wanted to let us know this was happening.  Message routed to Eric Form, app of the day for recommendations.  Sarah, please take a look at the information above from the patient. Can she adjust the times she takes the medication? Can she get something for the nausea and vomiting?

## 2019-05-03 ENCOUNTER — Encounter: Payer: Self-pay | Admitting: Family

## 2019-05-03 ENCOUNTER — Ambulatory Visit (INDEPENDENT_AMBULATORY_CARE_PROVIDER_SITE_OTHER): Payer: Medicare Other | Admitting: Family

## 2019-05-03 ENCOUNTER — Other Ambulatory Visit: Payer: Self-pay

## 2019-05-03 DIAGNOSIS — R399 Unspecified symptoms and signs involving the genitourinary system: Secondary | ICD-10-CM | POA: Diagnosis not present

## 2019-05-03 DIAGNOSIS — M150 Primary generalized (osteo)arthritis: Secondary | ICD-10-CM | POA: Diagnosis not present

## 2019-05-03 DIAGNOSIS — M5136 Other intervertebral disc degeneration, lumbar region: Secondary | ICD-10-CM | POA: Diagnosis not present

## 2019-05-03 DIAGNOSIS — M5030 Other cervical disc degeneration, unspecified cervical region: Secondary | ICD-10-CM | POA: Diagnosis not present

## 2019-05-03 DIAGNOSIS — M0579 Rheumatoid arthritis with rheumatoid factor of multiple sites without organ or systems involvement: Secondary | ICD-10-CM | POA: Diagnosis not present

## 2019-05-03 DIAGNOSIS — R7989 Other specified abnormal findings of blood chemistry: Secondary | ICD-10-CM | POA: Diagnosis not present

## 2019-05-03 LAB — URINALYSIS, COMPLETE
Bilirubin, UA: NEGATIVE
Glucose, UA: NEGATIVE
Ketones, UA: NEGATIVE
Nitrite, UA: NEGATIVE
Protein,UA: NEGATIVE
Specific Gravity, UA: 1.01 (ref 1.005–1.030)
Urobilinogen, Ur: 0.2 mg/dL (ref 0.2–1.0)
pH, UA: 5 (ref 5.0–7.5)

## 2019-05-03 LAB — MICROSCOPIC EXAMINATION: Renal Epithel, UA: NONE SEEN /hpf

## 2019-05-03 MED ORDER — CEPHALEXIN 500 MG PO CAPS: 500.0000 mg | ORAL_CAPSULE | Freq: Two times a day (BID) | ORAL | 0 refills | Status: DC

## 2019-05-03 NOTE — Progress Notes (Signed)
   Virtual Visit via telephone Note  I connected with Susan Fitzgerald on 05/03/19 at 1:41 pm by telephone and verified that I am speaking with the correct person using two identifiers. Susan Fitzgerald is currently located at home and husband is currently with her during visit. The provider, Evelina Dun, FNP is located in their office at time of visit.  I discussed the limitations, risks, security and privacy concerns of performing an evaluation and management service by telephone and the availability of in person appointments. I also discussed with the patient that there may be a patient responsible charge related to this service. The patient expressed understanding and agreed to proceed.   History and Present Illness:  Dysuria  This is a new problem. The current episode started in the past 7 days. The problem occurs intermittently. The problem has been waxing and waning. The quality of the pain is described as burning. The pain is at a severity of 2/10. The pain is mild. Associated symptoms include frequency, hesitancy and urgency. Pertinent negatives include no discharge, hematuria, nausea or vomiting. She has tried increased fluids for the symptoms. The treatment provided mild relief. There is no history of recurrent UTIs.      Review of Systems  Gastrointestinal: Negative for nausea and vomiting.  Genitourinary: Positive for dysuria, frequency, hesitancy and urgency. Negative for hematuria.  All other systems reviewed and are negative.    Observations/Objective: No SOB or distress noted   Assessment and Plan: 1. UTI symptoms Force fluids AZO over the counter X2 days RTO if symptoms worsen or do not improve  Culture pending - Urinalysis, Complete - Urine Culture - cephALEXin (KEFLEX) 500 MG capsule; Take 1 capsule (500 mg total) by mouth 2 (two) times daily.  Dispense: 14 capsule; Refill: 0     I discussed the assessment and treatment plan with the patient. The patient was  provided an opportunity to ask questions and all were answered. The patient agreed with the plan and demonstrated an understanding of the instructions.   The patient was advised to call back or seek an in-person evaluation if the symptoms worsen or if the condition fails to improve as anticipated.  The above assessment and management plan was discussed with the patient. The patient verbalized understanding of and has agreed to the management plan. Patient is aware to call the clinic if symptoms persist or worsen. Patient is aware when to return to the clinic for a follow-up visit. Patient educated on when it is appropriate to go to the emergency department.   Time call ended:  1:49 pm  I provided  8 minutes of non-face-to-face time during this encounter.    Evelina Dun, FNP

## 2019-05-06 LAB — URINE CULTURE

## 2019-05-13 ENCOUNTER — Telehealth: Payer: Self-pay | Admitting: Pulmonary Disease

## 2019-05-13 MED ORDER — ONDANSETRON 4 MG PO TBDP: 4.0000 mg | ORAL_TABLET | Freq: Three times a day (TID) | ORAL | 1 refills | Status: DC | PRN

## 2019-05-13 NOTE — Telephone Encounter (Signed)
Called and spoke with pt letting her know the information stated by Aaron Edelman that he wanted [place] to send in Zofran Rx for her to try to see if this would help. Pt verbalized understanding. Rx has been sent in for pt. Nothing further needed.

## 2019-05-13 NOTE — Telephone Encounter (Signed)
Okay to prescribe Zofran which the patient can use every 8 hours as needed for nausea.  Zofran 4 mg ODT Can take 1 tablet every 8 hours as needed for nausea 30 tablets, 1 refill  Please place order  Contact our office if symptoms are not improving.  Wyn Quaker, FNP

## 2019-05-13 NOTE — Telephone Encounter (Signed)
Called and spoke with Patient.  Patient stated Susan Fitzgerald is making her have nausea, vomiting, and occasional diarrhea. Patient stated her AM dose is taken with food, and 3 hours after taking it, she always has nausea, and vomits around every other day.  Patient stated she has diarrhea 1 or 2 times per week.  Patient stated evening dose does not seem to bother her.  Patient stated her new delivery is to be delivered Monday, 05/17/19.  Patient is requesting something for nausea. Patient scheduled with Dr Valeta Harms 05/19/19.  Message routed to [place] to advise on nausea  Last OV Dr [name] Browner, 02/11/19- Instructions  I have reviewed your scans which showed pulmonary fibrosis likely from rheumatoid arthritis I agree with holding the amiodarone.  We will discuss with Dr. Amil Amen about alternatives to methotrexate  We will check comprehensive metabolic panel today We will start you on a medication called Ofev for the pulmonary fibrosis. Follow-up in 1 month

## 2019-05-14 ENCOUNTER — Telehealth: Payer: Self-pay | Admitting: *Deleted

## 2019-05-14 ENCOUNTER — Other Ambulatory Visit: Payer: Self-pay | Admitting: Family Medicine

## 2019-05-14 ENCOUNTER — Other Ambulatory Visit: Payer: Self-pay

## 2019-05-14 NOTE — Telephone Encounter (Signed)
    COVID-19 Pre-Screening Questions:  . In the past 7 to 10 days have you had a cough,  shortness of breath, headache, congestion, fever (100 or greater) body aches, chills, sore throat, or sudden loss of taste or sense of smell? . Have you been around anyone with known Covid 19. . Have you been around anyone who is awaiting Covid 19 test results in the past 7 to 10 days? . Have you been around anyone who has been exposed to Covid 19, or has mentioned symptoms of Covid 19 within the past 7 to 10 days?  If you have any concerns/questions about symptoms patients report during screening (either on the phone or at [facility]). Contact the provider seeing the patient or DOD for further guidance.  If neither are available contact a member of the leadership team.           Patient answered no to all questions. She knows to wear a mask, to come into office alone and only 15 minutes early. Also, to call if her health changes. CP/cma

## 2019-05-17 ENCOUNTER — Other Ambulatory Visit: Payer: Self-pay

## 2019-05-17 ENCOUNTER — Ambulatory Visit (INDEPENDENT_AMBULATORY_CARE_PROVIDER_SITE_OTHER): Payer: Medicare Other | Admitting: *Deleted

## 2019-05-17 ENCOUNTER — Ambulatory Visit: Payer: Medicare Other

## 2019-05-17 DIAGNOSIS — L659 Nonscarring hair loss, unspecified: Secondary | ICD-10-CM | POA: Diagnosis not present

## 2019-05-17 DIAGNOSIS — E538 Deficiency of other specified B group vitamins: Secondary | ICD-10-CM

## 2019-05-17 NOTE — Progress Notes (Signed)
Pt given Cyanocobalamin inj Tolerated well 

## 2019-05-17 NOTE — Progress Notes (Signed)
HPI  The patient presents for evaluation of atrial fibrillation. She was cardioverted. However, she developed recurrent atrial fib. She had her flecainide does reduced because of a prolonged QT interval. The flecainide was subsequently discontinued as she had reverted to atrial fib. She has been followed in the atrial fib clinic. She did describe chest pain and was sent for [facility] which did not suggest ischemia. She does have a mildly reduced EF (40 - 45% on echo.) Her last echo in March 2019 demonstrated an improved EF of 55 - 60%. She was started on amiodarone and underwent DCCV. She called recently with epidsodes of weak spells. She has been in and out of fib but she does not think that this correlates with her sensation. She was told over the phone to reduce her pindolol. Last year she had increased dyspnea and decreased O2 sats. She was treated for pneumonia. She was referred to pulmonary. A CT was done. There was a thought that she might have an amiodarone pulmonary process. Other processes could not be excluded such as a reaction to her methotrexate. There was also suggestion of pulmonary edema. She has been treated with diuresis. She did have an increased creat but this is improved. She was treated with steroids and did improve.She has not been told that she has idiopathic idiopathicpulmonary fibrosis.  She wore a Holter.   Her rate is elevated and she is 100% atrial fibrillation.  She comes down to the 90s when she is sleeping.  At the last visit I increased her calcium channel blocker.  She thinks she is doing pretty well.  She is had some nausea and is currently taking some ondansetron.  She had a little bit of chest discomfort and thinks this is related to her OFEV.  She tolerated increased dose of the calcium channel blocker from last visit.  She brings me an extensive diary of heart rates and blood pressures and they have been well controlled.  Her  heart rate seems to go up sometimes with activity sometimes in the 120s but it comes down nicely and is not sustained.  She not feeling the palpitations and has had no presyncope or syncope.  He has been golfing.   Allergies  Allergen Reactions   Benazepril Hcl Cough    Current Outpatient Medications  Medication Sig Dispense Refill   apixaban (ELIQUIS) 2.5 MG TABS tablet Take 2.5 mg by mouth 2 (two) times daily.      azaTHIOprine (IMURAN) 50 MG tablet Take 50 mg by mouth daily.      calcium-vitamin D (OSCAL WITH D) 250-125 MG-UNIT per tablet Take 1 tablet by mouth 2 (two) times daily.     diltiazem (CARDIZEM CD) 300 MG 24 hr capsule Take 1 capsule (300 mg total) by mouth daily. 90 capsule 3   folic acid (FOLVITE) 1 MG tablet Take 1 mg by mouth daily.      gabapentin (NEURONTIN) 100 MG capsule Take 100 mg by mouth at bedtime.      levothyroxine (SYNTHROID, LEVOTHROID) 75 MCG tablet TAKE 1 TABLET BY MOUTH  DAILY 90 tablet 3   lisinopril (PRINIVIL,ZESTRIL) 20 MG tablet Take 1 tablet (20 mg total) by mouth daily. (Patient taking differently: Take 20 mg by mouth every evening. ) 90 tablet 3   lisinopril-hydrochlorothiazide (PRINZIDE,ZESTORETIC) 20-12.5 MG tablet Take 1 tablet by mouth daily. (Patient taking differently: Take 1 tablet by mouth every morning. ) 90 tablet 3   Melatonin 5 MG TABS  Take 5 mg by mouth at bedtime.     Multiple Vitamin (MULTIVITAMIN) capsule Take 1 capsule by mouth daily.     OFEV 150 MG CAPS Take 1 capsule (150 mg total) by mouth 2 (two) times daily. 60 capsule 5   ondansetron (ZOFRAN-ODT) 4 MG disintegrating tablet Take 1 tablet (4 mg total) by mouth every 8 (eight) hours as needed for nausea or vomiting. 30 tablet 1   potassium chloride SA (KLOR-CON M20) 20 MEQ tablet Take 1 tablet (20 mEq total) by mouth daily. 90 tablet 1   Cyanocobalamin (VITAMIN B-12 IJ) Inject once a month     furosemide (LASIX) 20 MG tablet TAKE 1 TO 2 TABLETS BY  MOUTH DAILY AS  NEEDED 180 tablet 1   Current Facility-Administered Medications  Medication Dose Route Frequency Provider Last Rate Last Dose   cyanocobalamin ((VITAMIN B-12)) injection 1,000 mcg  1,000 mcg Intramuscular Q30 days Chipper Herb, MD   1,000 mcg at 05/17/19 1427    Past Medical History:  Diagnosis Date   Arthritis    Bradycardia 2014   Calcification of bronchial airway 06/23/2018   01/2017 - See on CT imaging of the chest    Cataract    Cerebral vascular disease 06/19/2018   06/19/2018 - MRI with chronic microvascular ischemic change   Diverticulosis    Dyspnea    periodically   Dysrhythmia    a - fib   Heart murmur    ??? bruit   History of stress test    a. 04/09/13-nuclear stress-no ischemia low risk   Hypertension    Menopause    Osteoporosis    PAF (paroxysmal atrial fibrillation) ([place])    a. s/p prior DCCV; b. On flecainide & coumadin (CHA2DS2VASc = 4);  c. 03/2016 Echo: EF 55-60%, mod LVH, mod AI, mild to mod TR, PASP 67mmHg.   Pelvic fracture ([place])     Past Surgical History:  Procedure Laterality Date   BIOPSY BREAST     BIOPSY BREAST     right & benign   BREAST SURGERY     CARDIOVERSION N/A 10/30/2016   Procedure: CARDIOVERSION;  Surgeon: Larey Dresser, MD;  Location: [place];  Service: Cardiovascular;  Laterality: N/A;   CARDIOVERSION N/A 08/20/2017   Procedure: CARDIOVERSION;  Surgeon: Jerline Pain, MD;  Location: [facility];  Service: Cardiovascular;  Laterality: N/A;   CATARACT EXTRACTION     EYE SURGERY     cataracts   TEE WITHOUT CARDIOVERSION N/A 10/30/2016   Procedure: TRANSESOPHAGEAL ECHOCARDIOGRAM (TEE);  Surgeon: Larey Dresser, MD;  Location: [place];  Service: Cardiovascular;  Laterality: N/A;   TOTAL HIP ARTHROPLASTY Right 01/27/2017   Procedure: TOTAL HIP ARTHROPLASTY ANTERIOR APPROACH;  Surgeon: Rod Can, MD;  Location: [place];  Service: Orthopedics;  Laterality: Right;    ROS   As stated in the HPI and  negative for all other systems.   PHYSICAL EXAM BP 120/70    Pulse (!) 105    Ht 5' (1.524 m)    Wt 119 lb (54 kg)    BMI 23.24 kg/m   GENERAL:  Well appearing NECK:  No jugular venous distention, waveform within normal limits, carotid upstroke brisk and symmetric, no bruits, no thyromegaly LUNGS:  Clear to auscultation bilaterally CHEST:  Unremarkable HEART:  PMI not displaced or sustained,S1 and S2 within normal limits, no S3, no clicks, no rubs, no murmurs, irregular ABD:  Flat, positive bowel sounds normal in frequency in pitch, no  bruits, no rebound, no guarding, no midline pulsatile mass, no hepatomegaly, no splenomegaly EXT:  2 plus pulses throughout, no edema, no cyanosis no clubbing   EKG:    Atrial fibrillation, rate 105, left axis deviation, poor anterior R wave progression, no acute ST-T wave changes.  Lab Results  Component Value Date   TSH 1.520 04/20/2019   ALT 18 04/20/2019   AST 29 04/20/2019   ALKPHOS 78 04/20/2019   BILITOT 0.4 04/20/2019   PROT 6.6 04/20/2019   ALBUMIN 4.1 04/20/2019     ASSESSMENT AND PLAN  HTN:    Blood pressures controlled she will increase the dose of Cardizem.  If I need to go up on this dose in the future I will probably reduce the lisinopril.  ATRIAL FIB:   Susan Fitzgerald has a CHA2DS2 - VASc score of 4.  She tolerates anticoagulation.  I think she is got reasonable rate control at this point and much better than it was previously.  She will keep an eye on this if it creeps up I will increase to 360 mg Cardizem.   DYSPNEA:    This is much improved.  No change in therapy.

## 2019-05-19 ENCOUNTER — Encounter: Payer: Self-pay | Admitting: Cardiology

## 2019-05-19 ENCOUNTER — Encounter: Payer: Self-pay | Admitting: Pulmonary Disease

## 2019-05-19 ENCOUNTER — Ambulatory Visit (INDEPENDENT_AMBULATORY_CARE_PROVIDER_SITE_OTHER): Payer: Medicare Other | Admitting: Cardiology

## 2019-05-19 ENCOUNTER — Ambulatory Visit (INDEPENDENT_AMBULATORY_CARE_PROVIDER_SITE_OTHER): Payer: Medicare Other | Admitting: Pulmonary Disease

## 2019-05-19 ENCOUNTER — Other Ambulatory Visit: Payer: Self-pay

## 2019-05-19 VITALS — BP 120/70 | HR 76 | Ht 60.0 in | Wt 118.8 lb

## 2019-05-19 VITALS — BP 120/70 | HR 105 | Ht 60.0 in | Wt 119.0 lb

## 2019-05-19 DIAGNOSIS — M069 Rheumatoid arthritis, unspecified: Secondary | ICD-10-CM | POA: Diagnosis not present

## 2019-05-19 DIAGNOSIS — J849 Interstitial pulmonary disease, unspecified: Secondary | ICD-10-CM

## 2019-05-19 DIAGNOSIS — I482 Chronic atrial fibrillation, unspecified: Secondary | ICD-10-CM

## 2019-05-19 DIAGNOSIS — I5032 Chronic diastolic (congestive) heart failure: Secondary | ICD-10-CM

## 2019-05-19 DIAGNOSIS — I10 Essential (primary) hypertension: Secondary | ICD-10-CM

## 2019-05-19 MED ORDER — APIXABAN 2.5 MG PO TABS: 2.5000 mg | ORAL_TABLET | Freq: Two times a day (BID) | ORAL | 3 refills | Status: DC

## 2019-05-19 NOTE — Progress Notes (Signed)
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  Subjective:   PATIENT ID: Susan Fitzgerald GENDER: female DOB: January 25, 1932, MRN: [id number]  Chief Complaint  Patient presents with   Follow-up    She states she has recently brought a pulse ox and she monitors her oxygen levels. She states it runs between 95-97%. She states her breathing has been good and she has no new concerns.     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   Over the past year has notice her SOB. However of the past several months her SOB has been limiting her activity. She was an avid golfer and has recently been unable due to her SOB. She was started on prednisone for costochondritis back in June. During that time while she was on prednisone she felt much better. Started on amiodarone around 2 years ago in 2017, currently taking 233m daily, Higher loading dose at start.  Records from 05/25/2018, Dr. BAmil Amen rheumatology.  Patient had mild RF positive, positive anti-CCP, hand synovitis on exam, hand x-rays May 2017 with right ulnar styloid and early erosions of the right wrist and the second MCP.  She was treated for some time with methotrexate once weekly.  This has been stopped currently after having recent elevation in serum creatinine.  OV 07/08/2018 -since she was last seen in the office we obtained a high-resolution CT scan.  Per radiology there was evidence of congestive heart failure.  She was since followed up with cardiology and they increased her diuretic regimen.  She was started on oxygen therapy after her last visit.  OV 08/10/2018: She was started on tapering prednisone dose at her last visit.  Unfortunately on 07/26/2018 patient suffered a fall as well as a large skin tear  along the left anterior tibia.  She recently completed her last dose of prednisone this past Thursday.  She is now feeling like she is almost back to normal.  She does feel little weak.  She had no longer been using her oxygen as she is not felt like she is needed it.  She has been checking her pulse ox at home all of which have been in the mid 90s.  She denies dyspnea on exertion and shortness of breath cough or hemoptysis.  OV 11/27/2018: Patient seen in the office today after enrollment in pulmonary rehabilitation.  She has been doing well.  Her functional status has greatly improved since we last met.  She has been going to pulmonary rehab twice a week.  At this point time she is no longer requiring oxygen her sats maintained in the mid 90s even with significant exertion during pulmonary rehab.  After that she is normally going shopping on her own at this time.  Her husband is very glad to see that she is almost back to her baseline state after we initially met this past summer.  She has been restarted on methotrexate by her rheumatologist.  She did not tolerate the leflunomide due to significant hair loss.  Otherwise she has no significant respiratory complaints.  OV 01/20/2019: Patient seen today in follow-up.  She had recent high-resolution CT scan which revealed persistence of basilar fibrotic changes.  Some evidence of air trapping.  Today in the office we reviewed these images with  her and her husband.  Also prior to the point she had pulmonary function test which revealed an FVC of 2.1 L 114% predicted, FEV1 1.67 L, 120% predicted, ratio 77, TLC 78% and a DLCO 56% predicted.  She has been completing pulmonary rehabilitation as of today she has completed 26 sessions.  She is able to walk at a 2.7 on the treadmill.  She does feel like she has been progressing well there.  She has graduated to the 4 pound weights which she is very happy about.  She does feel that her physicality and her breathing ability  with exercise tolerance has greatly improved and has enjoyed pulmonary rehabilitation.  At this point she is somewhat concerned about the CT scan findings that she did receive in her my chart information.  We have discussed those today.  OV 05/19/2019: Since she was last seen in the office she has had continued improvement in her DOE. She was seen by Dr. [name] Browner in the ILD clinic and upon further discussion, risks, benefits and alternatives discussion she opted to start [place]. She has had ongoing nausea and occasional vomiting which she attributes to the medication. However, at this time with prn zofran use it is tolerable. She has been much more active with her ladies golfing group at [place].   Past Medical History:  Diagnosis Date   Arthritis    Bradycardia 2014   Calcification of bronchial airway 06/23/2018   01/2017 - See on CT imaging of the chest    Cataract    Cerebral vascular disease 06/19/2018   06/19/2018 - MRI with chronic microvascular ischemic change   Diverticulosis    Dyspnea    periodically   Dysrhythmia    a - fib   Heart murmur    ??? bruit   History of stress test    a. 04/09/13-nuclear stress-no ischemia low risk   Hypertension    Menopause    Osteoporosis    PAF (paroxysmal atrial fibrillation) (A[facility])    a. s/p prior DCCV; b. On flecainide & coumadin (CHA2DS2VASc = 4);  c. 03/2016 Echo: EF 55-60%, mod LVH, mod AI, mild to mod TR, PASP 51mHg.   Pelvic fracture (HCC)      Family History  Problem Relation Age of Onset   Stroke Mother        cerebral hemorrhage   Heart disease Father        MI   Heart attack Father    Vision loss Father    Heart disease Brother    Heart attack Brother    Cancer Maternal Aunt        breast   Cancer Brother    Heart disease Brother    Cancer Daughter        breast    Past Surgical History:  Procedure Laterality Date   BIOPSY BREAST     BIOPSY BREAST     right & benign   BREAST SURGERY      CARDIOVERSION N/A 10/30/2016   Procedure: CARDIOVERSION;  Surgeon: DLarey Dresser MD;  [place] [facility]  Service: Cardiovascular;  Laterality: N/A;   CARDIOVERSION N/A 08/20/2017   Procedure: CARDIOVERSION;  Surgeon: SJerline Pain MD;  [facility];  Service: Cardiovascular;  Laterality: N/A;   CATARACT EXTRACTION     EYE SURGERY     cataracts   TEE WITHOUT CARDIOVERSION N/A 10/30/2016   Procedure: TRANSESOPHAGEAL ECHOCARDIOGRAM (TEE);  Surgeon: DLarey Dresser MD;  [facility] [facility]  Service: Cardiovascular;  Laterality: N/A;   TOTAL HIP ARTHROPLASTY Right 01/27/2017   Procedure: TOTAL HIP ARTHROPLASTY ANTERIOR APPROACH;  Surgeon: Rod Can, MD;  Location: [facility];  Service: Orthopedics;  Laterality: Right;    Social History   Socioeconomic History   Marital status: Married    Spouse name: Engineer, technical sales    Number of children: 2   Years of education: Not on file   Highest education level: Not on file  Occupational History   Not on file  Social Needs   Financial resource strain: Not hard at all   Food insecurity    Worry: Never true    Inability: Never true   Transportation needs    Medical: No    Non-medical: No  Tobacco Use   Smoking status: Never Smoker   Smokeless tobacco: Never Used  Substance and Sexual Activity   Alcohol use: No   Drug use: No   Sexual activity: Yes  Lifestyle   Physical activity    Days per week: 2 days    Minutes per session: 60 min   Stress: Only a little  Relationships   Social connections    Talks on phone: More than three times a week    Gets together: More than three times a week    Attends religious service: More than 4 times per year    Active member of club or organization: Yes    Attends meetings of clubs or organizations: More than 4 times per year    Relationship status: Married   Intimate partner violence    Fear of current or ex partner: No    Emotionally abused: No    Physically  abused: No    Forced sexual activity: No  Other Topics Concern   Not on file  Social History Narrative   Not on file     Allergies  Allergen Reactions   Benazepril Hcl Cough     Outpatient Medications Prior to Visit  Medication Sig Dispense Refill   apixaban (ELIQUIS) 2.5 MG TABS tablet Take 1 tablet (2.5 mg total) by mouth 2 (two) times daily. 180 tablet 3   azaTHIOprine (IMURAN) 50 MG tablet Take 50 mg by mouth daily.      calcium-vitamin D (OSCAL WITH D) 250-125 MG-UNIT per tablet Take 1 tablet by mouth 2 (two) times daily.     Cyanocobalamin (VITAMIN B-12 IJ) Inject once a month     diltiazem (CARDIZEM CD) 300 MG 24 hr capsule Take 1 capsule (300 mg total) by mouth daily. 90 capsule 3   folic acid (FOLVITE) 1 MG tablet Take 1 mg by mouth daily.      furosemide (LASIX) 20 MG tablet TAKE 1 TO 2 TABLETS BY  MOUTH DAILY AS NEEDED 180 tablet 1   gabapentin (NEURONTIN) 100 MG capsule Take 100 mg by mouth at bedtime.      levothyroxine (SYNTHROID, LEVOTHROID) 75 MCG tablet TAKE 1 TABLET BY MOUTH  DAILY 90 tablet 3   lisinopril (PRINIVIL,ZESTRIL) 20 MG tablet Take 1 tablet (20 mg total) by mouth daily. (Patient taking differently: Take 20 mg by mouth every evening. ) 90 tablet 3   lisinopril-hydrochlorothiazide (PRINZIDE,ZESTORETIC) 20-12.5 MG tablet Take 1 tablet by mouth daily. (Patient taking differently: Take 1 tablet by mouth every morning. ) 90 tablet 3   Melatonin 5 MG TABS Take 5 mg by mouth at bedtime.     Multiple Vitamin (MULTIVITAMIN) capsule Take 1 capsule by mouth daily.     OFEV  150 MG CAPS Take 1 capsule (150 mg total) by mouth 2 (two) times daily. 60 capsule 5   ondansetron (ZOFRAN-ODT) 4 MG disintegrating tablet Take 1 tablet (4 mg total) by mouth every 8 (eight) hours as needed for nausea or vomiting. 30 tablet 1   potassium chloride SA (KLOR-CON M20) 20 MEQ tablet Take 1 tablet (20 mEq total) by mouth daily. 90 tablet 1   Facility-Administered  Medications Prior to Visit  Medication Dose Route Frequency Provider Last Rate Last Dose   cyanocobalamin ((VITAMIN B-12)) injection 1,000 mcg  1,000 mcg Intramuscular Q30 days Chipper Herb, MD   1,000 mcg at 05/17/19 1427    Review of Systems  Constitutional: Negative for chills, fever, malaise/fatigue and weight loss.  HENT: Negative for hearing loss, sore throat and tinnitus.   Eyes: Negative for blurred vision and double vision.  Respiratory: Positive for shortness of breath. Negative for cough, hemoptysis, sputum production, wheezing and stridor.   Cardiovascular: Negative for chest pain, palpitations, orthopnea, leg swelling and PND.  Gastrointestinal: Positive for nausea and vomiting. Negative for abdominal pain, constipation, diarrhea and heartburn.  Genitourinary: Negative for dysuria, hematuria and urgency.  Musculoskeletal: Negative for joint pain and myalgias.  Skin: Negative for itching and rash.  Neurological: Negative for dizziness, tingling, weakness and headaches.  Endo/Heme/Allergies: Negative for environmental allergies. Does not bruise/bleed easily.  Psychiatric/Behavioral: Negative for depression. The patient is not nervous/anxious and does not have insomnia.   All other systems reviewed and are negative.   Objective:  Physical Exam Vitals signs reviewed.  Constitutional:      General: She is not in acute distress.    Appearance: She is well-developed.  HENT:     Head: Normocephalic and atraumatic.  Eyes:     General: No scleral icterus.    Conjunctiva/sclera: Conjunctivae normal.     Pupils: Pupils are equal, round, and reactive to light.  Neck:     Musculoskeletal: Neck supple.     Vascular: No JVD.     Trachea: No tracheal deviation.  Cardiovascular:     Rate and Rhythm: Normal rate. Rhythm irregular.     Heart sounds: Normal heart sounds. No murmur.  Pulmonary:     Effort: Pulmonary effort is normal. No tachypnea, accessory muscle usage or  respiratory distress.     Breath sounds: No stridor. No wheezing, rhonchi or rales.     Comments: Bibasilar insp crackles Abdominal:     General: Bowel sounds are normal. There is no distension.     Palpations: Abdomen is soft.     Tenderness: There is no abdominal tenderness.  Musculoskeletal:        General: No tenderness.  Lymphadenopathy:     Cervical: No cervical adenopathy.  Skin:    General: Skin is warm and dry.     Capillary Refill: Capillary refill takes less than 2 seconds.     Findings: No rash.  Neurological:     Mental Status: She is alert and oriented to person, place, and time.  Psychiatric:        Behavior: Behavior normal.     Vitals:   05/19/19 1515  BP: 120/70  Pulse: 76  SpO2: 97%  Weight: 118 lb 12.8 oz (53.9 kg)  Height: 5' (1.524 m)   97% on RA  BMI Readings from Last 3 Encounters:  05/19/19 23.20 kg/m  05/19/19 23.24 kg/m  04/14/19 23.01 kg/m   Wt Readings from Last 3 Encounters:  05/19/19 118 lb 12.8 oz (  53.9 kg)  05/19/19 119 lb (54 kg)  04/14/19 117 lb 12.8 oz (53.4 kg)    CBC    Component Value Date/Time   WBC 4.6 04/20/2019 0828   WBC 4.8 08/15/2017 1206   RBC 3.80 04/20/2019 0828   RBC 4.05 08/15/2017 1206   HGB 12.3 04/20/2019 0828   HCT 36.4 04/20/2019 0828   PLT 165 04/20/2019 0828   MCV 96 04/20/2019 0828   MCH 32.4 04/20/2019 0828   MCH 30.6 08/15/2017 1206   MCHC 33.8 04/20/2019 0828   MCHC 32.0 08/15/2017 1206   RDW 13.2 04/20/2019 0828   LYMPHSABS 1.6 04/20/2019 0828   MONOABS 0.8 02/10/2017 0332   EOSABS 0.2 04/20/2019 0828   BASOSABS 0.1 04/20/2019 0828   Lab Results  Component Value Date   ALT 18 04/20/2019   AST 29 04/20/2019   ALKPHOS 78 04/20/2019   BILITOT 0.4 04/20/2019   Lab Results  Component Value Date   ESRSEDRATE 18 06/16/2018   Lab Results  Component Value Date   CREATININE 1.33 (H) 04/20/2019    Chest Imaging: CT imaging from 2017 was reviewed with basilar intersitial changes (my  read)  CXR from 05/2018 reviewed with bilateral prominent interstitial markings. (my read)    High-resolution CT from August 2019 Evidence of interlobular septal thickening and patchy ground glass opacities.  There is evidence in the upper lobes as well as the lower lobes however there is small amounts of bilateral pleural fluid.  This could represent pulmonary edema and decompensation of her known heart failure.  However the upper lobe predominance and is also seen more in the right than the left.  This could represent drug-induced disease. The patient's images have been independently reviewed by me.    High-resolution CT chest 01/13/2019: Peripheral and basilar pattern of subpleural reticulation and groundglass with areas of retraction and bronchiectasis.  These areas are more confluent and organized since the August follow-up images.  She does have stable calcified granulomas.  There is few small scattered areas of air trapping.  Stable T11 compression fraction.  Read from our thoracic radiologist with pulmonary parenchymal pattern of fibrosis that may be due to fibrotic NSIP versus UIP.  These images were reviewed with the patient today in the office. The patient's images have been independently reviewed by me.    Pulmonary Functions Testing Results:  07/07/2018 TLC - 56%  6MWT: Distance 288 meters, 2LNC   PFT Results Latest Ref Rng & Units 01/20/2019  FVC-Pre L 2.25  FVC-Predicted Pre % 118  FVC-Post L 2.17  FVC-Predicted Post % 114  Pre FEV1/FVC % % 73  Post FEV1/FCV % % 77  FEV1-Pre L 1.64  FEV1-Predicted Pre % 118  FEV1-Post L 1.67  DLCO UNC% % 56  DLCO COR %Predicted % 55     FeNO: None   Pathology: None   Echocardiogram:  01/27/2018 Study Conclusions  - Left ventricle: The cavity size was normal. Wall thickness was   increased in a pattern of mild LVH. There was mild focal basal   hypertrophy of the septum. Systolic function was normal. The   estimated ejection fraction  was in the range of 55% to 60%. Wall   motion was normal; there were no regional wall motion   abnormalities. Doppler parameters are consistent with abnormal   left ventricular relaxation (grade 1 diastolic dysfunction). - Aortic valve: There was mild regurgitation. - Ascending aorta: The ascending aorta was mildly dilated. - Mitral valve: Calcified annulus. -  Pulmonary arteries: Systolic pressure was mildly increased.  Impressions:  - Normal LV systolic function; mild diastolic dysfunction; mild   LVH; calcified aortic valve with mild AI; mildly dilated   ascending aorta; mild TR; mild pulmonary hypertension.  Heart Catheterization: none     Assessment & Plan:     ICD-10-CM   1. Rheumatoid arthritis, involving unspecified site, unspecified rheumatoid factor presence ([place])  M06.9   2. Interstitial pulmonary disease (HCC)  J84.9 CT Chest High Resolution  3. Diastolic dysfunction with chronic heart failure (HCC)  I50.32   4. 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  She also has a history of rheumatoid arthritis on methotrexate.  At this point she has ongoing fibrotic lung disease of unclear etiology.  This is seen in response to the fibrosis on her high-resolution CT scan imaging.  She was referred to our interstitial lung disease clinic.  The decision was made to place her on off F.  She has been on off it for the past 3 months.  Her methotrexate also has been transitioned to treatment with Imuran of her RA.  At this point, I think she needs to continue her daily activity level.  Encouraged her to continue to participate in her golf league as much as possible and to stay active.  As for the nausea and vomiting associated with  off if she can continue PRN use of Zofran.  She can return to clinic to see [place] in 3 months with repeat high-resolution CT scan imaging.  Greater than 50% of this patient's 25-minute visit was been face-to-face discussing above recommendations and treatment plan as well as reviewing medication management for the treatment of her interstitial lung disease to include anti-fibrotic's as well as immune suppression on Imuran for her RA.  I appreciate the ongoing input from Dr. Amil Amen and Dr. [name] Browner.   Current Outpatient Medications:    apixaban (ELIQUIS) 2.5 MG TABS tablet, Take 1 tablet (2.5 mg total) by mouth 2 (two) times daily., Disp: 180 tablet, Rfl: 3   azaTHIOprine (IMURAN) 50 MG tablet, Take 50 mg by mouth daily. , Disp: , Rfl:    calcium-vitamin D (OSCAL WITH D) 250-125 MG-UNIT per tablet, Take 1 tablet by mouth 2 (two) times daily., Disp: , Rfl:    Cyanocobalamin (VITAMIN B-12 IJ), Inject once a month, Disp: , Rfl:    diltiazem (CARDIZEM CD) 300 MG 24 hr capsule, Take 1 capsule (300 mg total) by mouth daily., Disp: 90 capsule, Rfl: 3   folic acid (FOLVITE) 1 MG tablet, Take 1 mg by mouth daily. , Disp: , Rfl:    furosemide (LASIX) 20 MG tablet, TAKE 1 TO 2 TABLETS BY  MOUTH DAILY AS NEEDED, Disp: 180 tablet, Rfl: 1   gabapentin (NEURONTIN) 100 MG capsule, Take 100 mg by mouth at bedtime. , Disp: , Rfl:    levothyroxine (SYNTHROID, LEVOTHROID) 75 MCG tablet, TAKE 1 TABLET BY MOUTH  DAILY, Disp: 90 tablet, Rfl: 3   lisinopril (PRINIVIL,ZESTRIL) 20 MG tablet, Take 1 tablet (20 mg total) by mouth daily. (Patient taking differently: Take 20 mg by mouth every evening. ), Disp: 90 tablet, Rfl: 3   lisinopril-hydrochlorothiazide (PRINZIDE,ZESTORETIC) 20-12.5 MG tablet, Take 1 tablet by mouth daily. (Patient taking differently: Take  1 tablet by mouth every morning. ), Disp: 90 tablet, Rfl: 3   Melatonin 5 MG TABS, Take 5 mg by mouth at bedtime., Disp: , Rfl:    Multiple Vitamin  (MULTIVITAMIN) capsule, Take 1 capsule by mouth daily., Disp: , Rfl:    OFEV 150 MG CAPS, Take 1 capsule (150 mg total) by mouth 2 (two) times daily., Disp: 60 capsule, Rfl: 5   ondansetron (ZOFRAN-ODT) 4 MG disintegrating tablet, Take 1 tablet (4 mg total) by mouth every 8 (eight) hours as needed for nausea or vomiting., Disp: 30 tablet, Rfl: 1   potassium chloride SA (KLOR-CON M20) 20 MEQ tablet, Take 1 tablet (20 mEq total) by mouth daily., Disp: 90 tablet, Rfl: 1  Current Facility-Administered Medications:    cyanocobalamin ((VITAMIN B-12)) injection 1,000 mcg, 1,000 mcg, Intramuscular, Q30 days, Chipper Herb, MD, 1,000 mcg at 05/17/19 1427   Garner Nash, DO [facility] 05/19/2019 3:46 PM

## 2019-05-19 NOTE — Patient Instructions (Addendum)
Thank you for visiting Dr. Valeta Harms at [facility].  Today we recommend the following: Orders Placed This Encounter  Procedures  . CT Chest High Resolution   Return in about 3 months (around 08/19/2019), or if symptoms worsen or fail to improve.

## 2019-05-19 NOTE — Patient Instructions (Signed)
Medication Instructions:  The current medical regimen is effective;  continue present plan and medications.  If you need a refill on your cardiac medications before your next appointment, please call your pharmacy.   Follow-Up: Follow up in 6 months with Dr. Hochrein.  You will receive a letter in the mail 2 months before you are due.  Please call us when you receive this letter to schedule your follow up appointment.  Thank you for choosing [facility]!!      

## 2019-06-15 ENCOUNTER — Other Ambulatory Visit: Payer: Self-pay

## 2019-06-16 ENCOUNTER — Ambulatory Visit (INDEPENDENT_AMBULATORY_CARE_PROVIDER_SITE_OTHER): Payer: Medicare Other

## 2019-06-16 DIAGNOSIS — D225 Melanocytic nevi of trunk: Secondary | ICD-10-CM | POA: Diagnosis not present

## 2019-06-16 DIAGNOSIS — L821 Other seborrheic keratosis: Secondary | ICD-10-CM | POA: Diagnosis not present

## 2019-06-16 DIAGNOSIS — E538 Deficiency of other specified B group vitamins: Secondary | ICD-10-CM

## 2019-06-16 DIAGNOSIS — L570 Actinic keratosis: Secondary | ICD-10-CM | POA: Diagnosis not present

## 2019-06-16 DIAGNOSIS — L659 Nonscarring hair loss, unspecified: Secondary | ICD-10-CM | POA: Diagnosis not present

## 2019-06-16 DIAGNOSIS — D1801 Hemangioma of skin and subcutaneous tissue: Secondary | ICD-10-CM | POA: Diagnosis not present

## 2019-06-16 DIAGNOSIS — Z85828 Personal history of other malignant neoplasm of skin: Secondary | ICD-10-CM | POA: Diagnosis not present

## 2019-06-17 ENCOUNTER — Telehealth: Payer: Self-pay | Admitting: Family Medicine

## 2019-06-18 ENCOUNTER — Telehealth: Payer: Self-pay | Admitting: Pulmonary Disease

## 2019-06-18 NOTE — Telephone Encounter (Signed)
Patient aware and states she has been tested at local testing center

## 2019-06-18 NOTE — Telephone Encounter (Signed)
Should quarantine and if symptoms develop we will send for testing

## 2019-06-18 NOTE — Telephone Encounter (Signed)
Called and spoke with pt. Pt said she was exposed someone 7/25 who had tested positive to covid and went to a place in [place] to be tested. Pt said she is still waiting on the results of the covid test and wanted to know if there was anything she needed to do.  Pt told me that she has had no symptoms. I told pt to finish out the quarantine period and closely monitor her symptoms and to call us if there was anything she needed. Pt verbalized understanding.  Nothing further needed.

## 2019-06-30 ENCOUNTER — Telehealth: Payer: Self-pay | Admitting: Pulmonary Disease

## 2019-06-30 ENCOUNTER — Encounter: Payer: Self-pay | Admitting: Family Medicine

## 2019-06-30 ENCOUNTER — Ambulatory Visit (INDEPENDENT_AMBULATORY_CARE_PROVIDER_SITE_OTHER): Payer: Medicare Other | Admitting: Family Medicine

## 2019-06-30 VITALS — Wt 112.0 lb

## 2019-06-30 DIAGNOSIS — N183 Chronic kidney disease, stage 3 unspecified: Secondary | ICD-10-CM

## 2019-06-30 DIAGNOSIS — J849 Interstitial pulmonary disease, unspecified: Secondary | ICD-10-CM

## 2019-06-30 DIAGNOSIS — I4891 Unspecified atrial fibrillation: Secondary | ICD-10-CM

## 2019-06-30 DIAGNOSIS — K521 Toxic gastroenteritis and colitis: Secondary | ICD-10-CM | POA: Diagnosis not present

## 2019-06-30 DIAGNOSIS — E039 Hypothyroidism, unspecified: Secondary | ICD-10-CM | POA: Diagnosis not present

## 2019-06-30 NOTE — Telephone Encounter (Signed)
Pt calling back. 2nd call.

## 2019-06-30 NOTE — Telephone Encounter (Signed)
ATC pt, line went to voicemail. LMTCB x1. Will hold in triage to f/u on for tomorrow should pt call back.

## 2019-06-30 NOTE — Progress Notes (Signed)
Telephone visit  Subjective: CC: diarrhea PCP: Susan Norlander, DO IWP:Susan Fitzgerald is a 85 y.o. female calls for telephone consult today. Patient provides verbal consent for consult held via phone.  Location of patient: home Location of provider: [facility] Others present for call: none  1. Diarrhea She reports she has had diarrhea for several months.  Symptoms onset with use of Ofev for pulmonary fibrosis.  She denies hematochezia/ melena.  She does report weight loss ~ 5lb.  She has also had nausea that is controlled with an antiemetic each morning.  She has been using imodium prn diarrhea but it will often result in constipation.  No fevers, chills.  She reports decreased appetite but she has been trying to maintain her eating.  She has f/u with Dr Valeta Harms in September.  Medical history is significant for CKD, atrial fibrillation.   ROS: Per HPI  Allergies  Allergen Reactions  . Benazepril Hcl Cough   Past Medical History:  Diagnosis Date  . Arthritis   . Bradycardia 2014  . Calcification of bronchial airway 06/23/2018   01/2017 - See on CT imaging of the chest   . Cataract   . Cerebral vascular disease 06/19/2018   06/19/2018 - MRI with chronic microvascular ischemic change  . Diverticulosis   . Dyspnea    periodically  . Dysrhythmia    a - fib  . Heart murmur    ??? bruit  . History of stress test    a. 04/09/13-nuclear stress-no ischemia low risk  . Hypertension   . Menopause   . Osteoporosis   . PAF (paroxysmal atrial fibrillation) ([place])    a. s/p prior DCCV; b. On flecainide & coumadin (CHA2DS2VASc = 4);  c. 03/2016 Echo: EF 55-60%, mod LVH, mod AI, mild to mod TR, PASP 63mmHg.  Marland Kitchen Pelvic fracture ([facility])     Current Outpatient Medications:  .  apixaban (ELIQUIS) 2.5 MG TABS tablet, Take 1 tablet (2.5 mg total) by mouth 2 (two) times daily., Disp: 180 tablet, Rfl: 3 .  azaTHIOprine (IMURAN) 50 MG tablet, Take 50 mg by mouth daily. , Disp: , Rfl:  .  calcium-vitamin D  (OSCAL WITH D) 250-125 MG-UNIT per tablet, Take 1 tablet by mouth 2 (two) times daily., Disp: , Rfl:  .  Cyanocobalamin (VITAMIN B-12 IJ), Inject once a month, Disp: , Rfl:  .  diltiazem (CARDIZEM CD) 300 MG 24 hr capsule, Take 1 capsule (300 mg total) by mouth daily., Disp: 90 capsule, Rfl: 3 .  folic acid (FOLVITE) 1 MG tablet, Take 1 mg by mouth daily. , Disp: , Rfl:  .  furosemide (LASIX) 20 MG tablet, TAKE 1 TO 2 TABLETS BY  MOUTH DAILY AS NEEDED, Disp: 180 tablet, Rfl: 1 .  gabapentin (NEURONTIN) 100 MG capsule, Take 100 mg by mouth at bedtime. , Disp: , Rfl:  .  levothyroxine (SYNTHROID, LEVOTHROID) 75 MCG tablet, TAKE 1 TABLET BY MOUTH  DAILY, Disp: 90 tablet, Rfl: 3 .  lisinopril (PRINIVIL,ZESTRIL) 20 MG tablet, Take 1 tablet (20 mg total) by mouth daily. (Patient taking differently: Take 20 mg by mouth every evening. ), Disp: 90 tablet, Rfl: 3 .  lisinopril-hydrochlorothiazide (PRINZIDE,ZESTORETIC) 20-12.5 MG tablet, Take 1 tablet by mouth daily. (Patient taking differently: Take 1 tablet by mouth every morning. ), Disp: 90 tablet, Rfl: 3 .  Melatonin 5 MG TABS, Take 5 mg by mouth at bedtime., Disp: , Rfl:  .  Multiple Vitamin (MULTIVITAMIN) capsule, Take 1 capsule by mouth  daily., Disp: , Rfl:  .  OFEV 150 MG CAPS, Take 1 capsule (150 mg total) by mouth 2 (two) times daily., Disp: 60 capsule, Rfl: 5 .  ondansetron (ZOFRAN-ODT) 4 MG disintegrating tablet, Take 1 tablet (4 mg total) by mouth every 8 (eight) hours as needed for nausea or vomiting., Disp: 30 tablet, Rfl: 1 .  potassium chloride SA (KLOR-CON M20) 20 MEQ tablet, Take 1 tablet (20 mEq total) by mouth daily., Disp: 90 tablet, Rfl: 1  Current Facility-Administered Medications:  .  cyanocobalamin ((VITAMIN B-12)) injection 1,000 mcg, 1,000 mcg, Intramuscular, Q30 days, Chipper Herb, MD, 1,000 mcg at 06/16/19 1011  Assessment/ Plan: 85 y.o. female   1. Drug-induced diarrhea I suspect diarrhea is drug-induced.  Doubt that  this is infectious in nature.  She is on a couple of medications that could promote increased diarrhea.  We discussed that about 76% of people have had reported diarrhea with the OFEV.  I have recommended that she consider talking to pulmonology about the medication and perhaps they could consider adjusting the medicine to a lower dose versus giving a drug holiday.  I do worry that the risks of the medication may outweigh the benefits given known CKD, risk of dehydration and electrolyte imbalance in this elderly patient with atrial fibrillation.  We will check her kidney function, CBC and thyroid panel.  I reviewed her last TSH was within normal range and I at this time do not suspect that this diarrhea is related to her thyroid.  I advised her to start a probiotic, hydrate well.  Okay to use Gatorade. - Basic metabolic panel; Future - CBC; Future - Thyroid Panel With TSH; Future  2. Hypothyroidism, unspecified type - Thyroid Panel With TSH; Future  3. CKD (chronic kidney disease) stage 3. Evaluate for possible acute on chronic kidney injury - CBC; Future  4. Atrial fibrillation, unspecified type ([place]) - CBC; Future   Start time: 9:25am End time: 9:42am  Total time spent on patient care (including telephone call/ virtual visit): 26 minutes  Antoine, [place] [phone]

## 2019-06-30 NOTE — Telephone Encounter (Signed)
Please call at [phone].

## 2019-07-01 MED ORDER — OFEV 100 MG PO CAPS: 100.0000 mg | ORAL_CAPSULE | Freq: Two times a day (BID) | ORAL | 5 refills | Status: DC

## 2019-07-01 NOTE — Telephone Encounter (Signed)
Attempted to call pt but unable to reach. Left message for pt to return call. 

## 2019-07-01 NOTE — Telephone Encounter (Signed)
Pt returning call

## 2019-07-01 NOTE — Telephone Encounter (Signed)
Called and spoke with pt who stated she has begun to have increased diarrhea again with the OFEV. Pt said that she has skipped some doses of the OFEV but said that when skipping some doses, she has had the diarrhea again.  Pt said that she has gotten immodium that she has taken to see if it would help with the diarrhea but said that when taking the imodium it has made her constipated at times and then after being constipated, she had then taken a laxative which then produced some loose stools. Pt said with the OFEV, it has increased the diarrhea.  Pt said that she has been on the [facility] since March 2020 now. Pt wanted to know if there was another alternative that can be done whether have the OFEV changed to her taking it once a day. Stated to pt that we could check with Dr. Valeta Harms to see if he might want to change the dose from the 150mg  OFEV to the 100mg  dose to see if that would help with the diarrhea. Stated to her as soon as we heard from Dr. Valeta Harms in regards to what he recommends we would let her know. Pt verbalized understanding.  Dr. Valeta Harms, please advise on this what you recommend with pt's OFEV and her still having issues with diarrhea being on the 150mg  dose.

## 2019-07-01 NOTE — Telephone Encounter (Signed)
Agree. Stop ofev for a week and restart at lower dose of 100 mg bid Use imodium for symptomatic relief

## 2019-07-01 NOTE — Telephone Encounter (Signed)
Routing to Dr. [name] Browner as well for him to further look at this.

## 2019-07-01 NOTE — Telephone Encounter (Signed)
PCCM:  Yes I think decreasing the dose and using the imodium would be appropriate.   I will ask Dr. [name] Browner to see what he thinks. He started the [facility] for her PF.   Please see telephone note.   Garner Nash, DO [facility] 07/01/2019 2:44 PM

## 2019-07-01 NOTE — Telephone Encounter (Signed)
Called the patient to advise of Dr. Matilde Bash response. She agreed and stated she found that if she uses the imodium on a regular basis it actually causes constipation. Patient stated she contacted her primary doctors office and was told to try taking probiotics and Gatoraide to see if that will help her GI issue.  Patient confirmed she does have an appointment to be seen by her primary doctor office tomorrow and will have labs done. Patient will request the lab results are sent to Dr. Matilde Bash attention.   Patient confirmed that she did not take the Ofev today, but did still have diarrhea. Patient voiced understanding. Nothing further needed.  Prescription for Ofev 100 mg BID sent to [facility]. LVM for the patient to advise this was done and to call if she has any additional questions.

## 2019-07-02 ENCOUNTER — Other Ambulatory Visit: Payer: Self-pay

## 2019-07-02 ENCOUNTER — Other Ambulatory Visit: Payer: Medicare Other

## 2019-07-02 DIAGNOSIS — E039 Hypothyroidism, unspecified: Secondary | ICD-10-CM | POA: Diagnosis not present

## 2019-07-02 DIAGNOSIS — I4891 Unspecified atrial fibrillation: Secondary | ICD-10-CM | POA: Diagnosis not present

## 2019-07-02 DIAGNOSIS — N183 Chronic kidney disease, stage 3 unspecified: Secondary | ICD-10-CM

## 2019-07-02 DIAGNOSIS — K521 Toxic gastroenteritis and colitis: Secondary | ICD-10-CM | POA: Diagnosis not present

## 2019-07-02 NOTE — Telephone Encounter (Signed)
Called the patient and confirmed the message was received. Advised the patient that once she starts taking the 100 mg to call us if the symptoms return. Patient voiced understanding. Nothing further needed at this time.

## 2019-07-03 LAB — CBC
Hematocrit: 36.8 % (ref 34.0–46.6)
Hemoglobin: 12.2 g/dL (ref 11.1–15.9)
MCH: 32.5 pg (ref 26.6–33.0)
MCHC: 33.2 g/dL (ref 31.5–35.7)
MCV: 98 fL — ABNORMAL HIGH (ref 79–97)
Platelets: 180 10*3/uL (ref 150–450)
RBC: 3.75 x10E6/uL — ABNORMAL LOW (ref 3.77–5.28)
RDW: 13.3 % (ref 11.7–15.4)
WBC: 4.4 10*3/uL (ref 3.4–10.8)

## 2019-07-03 LAB — THYROID PANEL WITH TSH
Free Thyroxine Index: 3 (ref 1.2–4.9)
T3 Uptake Ratio: 31 % (ref 24–39)
T4, Total: 9.6 ug/dL (ref 4.5–12.0)
TSH: 1.34 u[IU]/mL (ref 0.450–4.500)

## 2019-07-03 LAB — BASIC METABOLIC PANEL
BUN/Creatinine Ratio: 19 (ref 12–28)
BUN: 30 mg/dL — ABNORMAL HIGH (ref 8–27)
CO2: 29 mmol/L (ref 20–29)
Calcium: 9.4 mg/dL (ref 8.7–10.3)
Chloride: 101 mmol/L (ref 96–106)
Creatinine, Ser: 1.6 mg/dL — ABNORMAL HIGH (ref 0.57–1.00)
GFR calc Af Amer: 34 mL/min/{1.73_m2} — ABNORMAL LOW (ref >59)
GFR calc non Af Amer: 29 mL/min/{1.73_m2} — ABNORMAL LOW (ref >59)
Glucose: 72 mg/dL (ref 65–99)
Potassium: 4.3 mmol/L (ref 3.5–5.2)
Sodium: 143 mmol/L (ref 134–144)

## 2019-07-06 ENCOUNTER — Other Ambulatory Visit: Payer: Self-pay | Admitting: Family Medicine

## 2019-07-07 ENCOUNTER — Telehealth: Payer: Self-pay | Admitting: *Deleted

## 2019-07-07 NOTE — Telephone Encounter (Signed)
Pt notified of results Verbalizes understanding 

## 2019-07-13 DIAGNOSIS — M5030 Other cervical disc degeneration, unspecified cervical region: Secondary | ICD-10-CM | POA: Diagnosis not present

## 2019-07-13 DIAGNOSIS — M150 Primary generalized (osteo)arthritis: Secondary | ICD-10-CM | POA: Diagnosis not present

## 2019-07-13 DIAGNOSIS — M0579 Rheumatoid arthritis with rheumatoid factor of multiple sites without organ or systems involvement: Secondary | ICD-10-CM | POA: Diagnosis not present

## 2019-07-13 DIAGNOSIS — M5136 Other intervertebral disc degeneration, lumbar region: Secondary | ICD-10-CM | POA: Diagnosis not present

## 2019-07-19 ENCOUNTER — Other Ambulatory Visit: Payer: Self-pay

## 2019-07-20 ENCOUNTER — Ambulatory Visit (INDEPENDENT_AMBULATORY_CARE_PROVIDER_SITE_OTHER): Payer: Medicare Other | Admitting: *Deleted

## 2019-07-20 DIAGNOSIS — L659 Nonscarring hair loss, unspecified: Secondary | ICD-10-CM | POA: Diagnosis not present

## 2019-07-20 DIAGNOSIS — E538 Deficiency of other specified B group vitamins: Secondary | ICD-10-CM

## 2019-07-20 DIAGNOSIS — Z23 Encounter for immunization: Secondary | ICD-10-CM

## 2019-07-20 NOTE — Progress Notes (Signed)
Pt given Cyanocobalamin inj And flu vaccine Tolerated well

## 2019-07-27 ENCOUNTER — Other Ambulatory Visit: Payer: Self-pay | Admitting: Pulmonary Disease

## 2019-07-28 ENCOUNTER — Telehealth: Payer: Self-pay

## 2019-07-28 NOTE — Telephone Encounter (Signed)
Message is blank. Check with pcc to see if anything was needed. She states it was sent to her blank. Will close encounter.

## 2019-07-30 ENCOUNTER — Other Ambulatory Visit: Payer: Self-pay

## 2019-07-30 ENCOUNTER — Ambulatory Visit (INDEPENDENT_AMBULATORY_CARE_PROVIDER_SITE_OTHER): Payer: Medicare Other | Admitting: Pulmonary Disease

## 2019-07-30 ENCOUNTER — Encounter: Payer: Self-pay | Admitting: Pulmonary Disease

## 2019-07-30 VITALS — BP 110/70 | HR 84 | Temp 97.2°F | Ht 60.0 in | Wt 112.8 lb

## 2019-07-30 DIAGNOSIS — R768 Other specified abnormal immunological findings in serum: Secondary | ICD-10-CM

## 2019-07-30 DIAGNOSIS — R7989 Other specified abnormal findings of blood chemistry: Secondary | ICD-10-CM | POA: Diagnosis not present

## 2019-07-30 DIAGNOSIS — M0579 Rheumatoid arthritis with rheumatoid factor of multiple sites without organ or systems involvement: Secondary | ICD-10-CM

## 2019-07-30 DIAGNOSIS — J8410 Pulmonary fibrosis, unspecified: Secondary | ICD-10-CM | POA: Diagnosis not present

## 2019-07-30 DIAGNOSIS — J849 Interstitial pulmonary disease, unspecified: Secondary | ICD-10-CM | POA: Diagnosis not present

## 2019-07-30 NOTE — Progress Notes (Signed)
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  Subjective:   PATIENT ID: Susan Fitzgerald GENDER: female DOB: [birth year], MRN: [id number]  Chief Complaint  Patient presents with   Follow-up    Reports no new She reports her breathing has been at her basline.     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  C/o SOB amiodarone 2017 through 2019.  Brief time of stopping MTX due to elevated serum creatinine.  OV 07/08/2018 -HRCT, ?  Edema or GGO.  On oxygen walking with walker.  Started prednisone for possible ILD exacerbation versus drug-induced ILD  OV 08/10/2018: Follow-up on prednisone. Unfortunately on 07/26/2018 patient suffered a fall as well as a large skin tear along the left anterior tibia.  Feeling weak still on oxygen.  O2 sats low 90s.  OV 11/27/2018: Continued enrollment in pulmonary rehab.  Functional status significantly improved.  OV 01/20/2019: Repeat HRCT, basilar fibrosis.  PFTs FVC of 2.1 L 114% predicted, FEV1 1.67 L, 120% predicted, ratio 77, TLC 78% and a DLCO 56% predicted.  She has been completing pulmonary rehabilitation as of today she has completed 26 sessions.  She is able to walk at a 2.7 on the treadmill.  Doing well getting back to normal functional status.  Referred to ILD clinic.  OV 02/11/2019: Seen by Dr. [name] Browner.  Started on [place].  Following did have issues with diarrhea.  OV 05/19/2019: Since she was last seen in the office she has had continued improvement in her DOE. She was seen by Dr. [name] Browner in the ILD clinic and upon further discussion, risks, benefits and alternatives discussion she opted to start [place]. She has had ongoing nausea and occasional vomiting which she attributes to the medication. However, at this time with  prn zofran use it is tolerable. She has been much more active with her ladies golfing group at [place].   OV 07/30/2019: Doing well today.  Patient has no complaints.  She did have a severe bout of diarrhea related to her anti-fibrotic.  She went on a drug holiday for 7 days.  After the drug holiday she dose reduced 200 mg daily.  With regular use of Imodium and addition of probiotics to her regimen her bowel movements are somewhat more stable.  She routinely feels nauseated.  She uses her Zofran when she plans to go play golf.  At this time she is able to enjoy at least 2 rounds per week with her ladies league.  Overall she is doing well after switching from azathioprine with management of her RA by Dr. Amil Amen.   Past Medical History:  Diagnosis Date   Arthritis    Bradycardia 2014   Calcification of bronchial airway 06/23/2018   01/2017 - See on CT imaging of the chest    Cataract    Cerebral vascular disease 06/19/2018   06/19/2018 - MRI with chronic microvascular ischemic change   Diverticulosis    Dyspnea    periodically   Dysrhythmia    a - fib   Heart murmur    ??? bruit   History of stress test    a. 04/09/13-nuclear stress-no ischemia low risk   Hypertension    Menopause    Osteoporosis    PAF (paroxysmal atrial fibrillation) ([place])  a. s/p prior DCCV; b. On flecainide & coumadin (CHA2DS2VASc = 4);  c. 03/2016 Echo: EF 55-60%, mod LVH, mod AI, mild to mod TR, PASP 69mHg.   Pelvic fracture (HCC)      Family History  Problem Relation Age of Onset   Stroke Mother        cerebral hemorrhage   Heart disease Father        MI   Heart attack Father    Vision loss Father    Heart disease Brother    Heart attack Brother    Cancer Maternal Aunt        breast   Cancer Brother    Heart disease Brother    Cancer Daughter        breast    Past Surgical History:  Procedure Laterality Date   BIOPSY BREAST     BIOPSY BREAST     right & benign    BREAST SURGERY     CARDIOVERSION N/A 10/30/2016   Procedure: CARDIOVERSION;  Surgeon: DLarey Dresser MD;  [facility]  Service: Cardiovascular;  Laterality: N/A;   CARDIOVERSION N/A 08/20/2017   Procedure: CARDIOVERSION;  Surgeon: SJerline Pain MD;  [facility];  Service: Cardiovascular;  Laterality: N/A;   CATARACT EXTRACTION     EYE SURGERY     cataracts   TEE WITHOUT CARDIOVERSION N/A 10/30/2016   Procedure: TRANSESOPHAGEAL ECHOCARDIOGRAM (TEE);  Surgeon: DLarey Dresser MD;  [facility]  Service: Cardiovascular;  Laterality: N/A;   TOTAL HIP ARTHROPLASTY Right 01/27/2017   Procedure: TOTAL HIP ARTHROPLASTY ANTERIOR APPROACH;  Surgeon: BRod Can MD;  [facility]  Service: Orthopedics;  Laterality: Right;    Social History   Socioeconomic History   Marital status: Married    Spouse name: BEngineer, technical sales   Number of children: 2   Years of education: Not on file   Highest education level: Not on file  Occupational History   Not on file  Social Needs   Financial resource strain: Not hard at all   Food insecurity    Worry: Never true    Inability: Never true   Transportation needs    Medical: No    Non-medical: No  Tobacco Use   Smoking status: Never Smoker   Smokeless tobacco: Never Used  Substance and Sexual Activity   Alcohol use: No   Drug use: No   Sexual activity: Yes  Lifestyle   Physical activity    Days per week: 2 days    Minutes per session: 60 min   Stress: Only a little  Relationships   Social connections    Talks on phone: More than three times a week    Gets together: More than three times a week    Attends religious service: More than 4 times per year    Active member of club or organization: Yes    Attends meetings of clubs or organizations: More than 4 times per year    Relationship status: Married   Intimate partner violence    Fear of current or ex partner: No    Emotionally abused:  No    Physically abused: No    Forced sexual activity: No  Other Topics Concern   Not on file  Social History Narrative   Not on file     Allergies  Allergen Reactions   Benazepril Hcl Cough     Outpatient Medications Prior to Visit  Medication Sig Dispense Refill  apixaban (ELIQUIS) 2.5 MG TABS tablet Take 1 tablet (2.5 mg total) by mouth 2 (two) times daily. 180 tablet 3   azaTHIOprine (IMURAN) 50 MG tablet Take 50 mg by mouth daily.      calcium-vitamin D (OSCAL WITH D) 250-125 MG-UNIT per tablet Take 1 tablet by mouth 2 (two) times daily.     Cyanocobalamin (VITAMIN B-12 IJ) Inject once a month     diltiazem (CARDIZEM CD) 300 MG 24 hr capsule Take 1 capsule (300 mg total) by mouth daily. 90 capsule 3   folic acid (FOLVITE) 1 MG tablet Take 1 mg by mouth daily.      furosemide (LASIX) 20 MG tablet TAKE 1 TO 2 TABLETS BY  MOUTH DAILY AS NEEDED 180 tablet 1   gabapentin (NEURONTIN) 100 MG capsule Take 100 mg by mouth at bedtime.      levothyroxine (SYNTHROID, LEVOTHROID) 75 MCG tablet TAKE 1 TABLET BY MOUTH  DAILY 90 tablet 3   lisinopril (PRINIVIL,ZESTRIL) 20 MG tablet Take 1 tablet (20 mg total) by mouth daily. (Patient taking differently: Take 20 mg by mouth every evening. ) 90 tablet 3   lisinopril-hydrochlorothiazide (PRINZIDE,ZESTORETIC) 20-12.5 MG tablet Take 1 tablet by mouth daily. (Patient taking differently: Take 1 tablet by mouth every morning. ) 90 tablet 3   Melatonin 5 MG TABS Take 5 mg by mouth at bedtime.     Multiple Vitamin (MULTIVITAMIN) capsule Take 1 capsule by mouth daily.     Nintedanib (OFEV) 100 MG CAPS Take 1 capsule (100 mg total) by mouth 2 (two) times daily. This prescription sent due to the 150 mg BID causing diarrhea. This prescription for 100 mg BID sent at Dr. Matilde Bash request. (Patient taking differently: Take 100 mg by mouth 2 (two) times daily. This prescription for 100 mg BID sent at Dr. Matilde Bash request. (decreased from 162m due  to diarrhea)) 60 capsule 5   ondansetron (ZOFRAN-ODT) 4 MG disintegrating tablet DISSOLVE 1 TABLET IN MOUTH EVERY 8 HOURS AS NEEDED FOR NAUSEA AND VOMITING 30 tablet 0   potassium chloride SA (K-DUR) 20 MEQ tablet TAKE 1 TABLET BY MOUTH  DAILY 90 tablet 0   Facility-Administered Medications Prior to Visit  Medication Dose Route Frequency Provider Last Rate Last Dose   cyanocobalamin ((VITAMIN B-12)) injection 1,000 mcg  1,000 mcg Intramuscular Q30 days MChipper Herb MD   1,000 mcg at 07/20/19 1540    Review of Systems  Constitutional: Negative for chills, fever, malaise/fatigue and weight loss.  HENT: Negative for hearing loss, sore throat and tinnitus.   Eyes: Negative for blurred vision and double vision.  Respiratory: Negative for cough, hemoptysis, sputum production, shortness of breath, wheezing and stridor.   Cardiovascular: Negative for chest pain, palpitations, orthopnea, leg swelling and PND.  Gastrointestinal: Negative for abdominal pain, constipation, diarrhea, heartburn, nausea and vomiting.  Genitourinary: Negative for dysuria, hematuria and urgency.  Musculoskeletal: Positive for joint pain and myalgias.  Skin: Negative for itching and rash.  Neurological: Negative for dizziness, tingling, weakness and headaches.  Endo/Heme/Allergies: Negative for environmental allergies. Does not bruise/bleed easily.  Psychiatric/Behavioral: Negative for depression. The patient is not nervous/anxious and does not have insomnia.   All other systems reviewed and are negative.   Objective:  Physical Exam Vitals signs reviewed.  Constitutional:      General: She is not in acute distress.    Appearance: She is well-developed.  HENT:     Head: Normocephalic and atraumatic.     Mouth/Throat:  Pharynx: No oropharyngeal exudate.  Eyes:     Conjunctiva/sclera: Conjunctivae normal.     Pupils: Pupils are equal, round, and reactive to light.  Neck:     Vascular: No JVD.      Trachea: No tracheal deviation.     Comments: Loss of supraclavicular fat Cardiovascular:     Rate and Rhythm: Normal rate and regular rhythm.     Heart sounds: S1 normal and S2 normal.     Comments: Distant heart tones Pulmonary:     Effort: No tachypnea or accessory muscle usage.     Breath sounds: No stridor. Decreased breath sounds (throughout all lung fields) present. No wheezing, rhonchi or rales.     Comments: Bibasilar and lateral field crackles. Abdominal:     General: Bowel sounds are normal. There is no distension.     Palpations: Abdomen is soft.     Tenderness: There is no abdominal tenderness.  Musculoskeletal:        General: Deformity (muscle wasting ) present.     Comments: BL LE hyperpigmentations and venous stasis   Skin:    General: Skin is warm and dry.     Capillary Refill: Capillary refill takes less than 2 seconds.     Findings: No rash.  Neurological:     Mental Status: She is alert and oriented to person, place, and time.  Psychiatric:        Behavior: Behavior normal.     Vitals:   07/30/19 1050  BP: 110/70  Pulse: 84  Temp: (!) 97.2 F (36.2 C)  TempSrc: Temporal  SpO2: 97%  Weight: 112 lb 12.8 oz (51.2 kg)  Height: 5' (1.524 m)   97% on RA  BMI Readings from Last 3 Encounters:  07/30/19 22.03 kg/m  06/30/19 21.87 kg/m  05/19/19 23.20 kg/m   Wt Readings from Last 3 Encounters:  07/30/19 112 lb 12.8 oz (51.2 kg)  06/30/19 112 lb (50.8 kg)  05/19/19 118 lb 12.8 oz (53.9 kg)    CBC    Component Value Date/Time   WBC 4.4 07/02/2019 1144   WBC 4.8 08/15/2017 1206   RBC 3.75 (L) 07/02/2019 1144   RBC 4.05 08/15/2017 1206   HGB 12.2 07/02/2019 1144   HCT 36.8 07/02/2019 1144   PLT 180 07/02/2019 1144   MCV 98 (H) 07/02/2019 1144   MCH 32.5 07/02/2019 1144   MCH 30.6 08/15/2017 1206   MCHC 33.2 07/02/2019 1144   MCHC 32.0 08/15/2017 1206   RDW 13.3 07/02/2019 1144   LYMPHSABS 1.6 04/20/2019 0828   MONOABS 0.8 02/10/2017 0332     EOSABS 0.2 04/20/2019 0828   BASOSABS 0.1 04/20/2019 0828   Lab Results  Component Value Date   ALT 18 04/20/2019   AST 29 04/20/2019   ALKPHOS 78 04/20/2019   BILITOT 0.4 04/20/2019   Lab Results  Component Value Date   ESRSEDRATE 18 06/16/2018   Lab Results  Component Value Date   CREATININE 1.60 (H) 07/02/2019    Chest Imaging: CT imaging from 2017 was reviewed with basilar intersitial changes (my read)  CXR from 05/2018 reviewed with bilateral prominent interstitial markings. (my read)    High-resolution CT from August 2019 Evidence of interlobular septal thickening and patchy ground glass opacities.  There is evidence in the upper lobes as well as the lower lobes however there is small amounts of bilateral pleural fluid.  This could represent pulmonary edema and decompensation of her known heart failure.  However the  upper lobe predominance and is also seen more in the right than the left.  This could represent drug-induced disease. The patient's images have been independently reviewed by me.   ° °High-resolution CT chest 01/13/2019: °Peripheral and basilar pattern of subpleural reticulation and groundglass with areas of retraction and bronchiectasis.  These areas are more confluent and organized since the August follow-up images.  She does have stable calcified granulomas.  There is few small scattered areas of air trapping.  Stable T11 compression fraction.  Read from our thoracic radiologist with pulmonary parenchymal pattern of fibrosis that may be due to fibrotic NSIP versus UIP.  These images were reviewed with the patient today in the office. °The patient's images have been independently reviewed by me.   ° °Pulmonary Functions Testing Results: ° °07/07/2018 °TLC - 56% ° °6MWT: Distance 288 meters, 2LNC  ° °PFT Results Latest Ref Rng & Units 01/20/2019  °FVC-Pre L 2.25  °FVC-Predicted Pre % 118  °FVC-Post L 2.17  °FVC-Predicted Post % 114  °Pre FEV1/FVC % % 73  °Post FEV1/FCV % % 77   °FEV1-Pre L 1.64  °FEV1-Predicted Pre % 118  °FEV1-Post L 1.67  °DLCO UNC% % 56  °DLCO COR %Predicted % 55  °  ° °FeNO: None  ° °Pathology: None  ° °Echocardiogram:  °01/27/2018 °Study Conclusions °  °- Left ventricle: The cavity size was normal. Wall thickness was °  increased in a pattern of mild LVH. There was mild focal basal °  hypertrophy of the septum. Systolic function was normal. The °  estimated ejection fraction was in the range of 55% to 60%. Wall °  motion was normal; there were no regional wall motion °  abnormalities. Doppler parameters are consistent with abnormal °  left ventricular relaxation (grade 1 diastolic dysfunction). °- Aortic valve: There was mild regurgitation. °- Ascending aorta: The ascending aorta was mildly dilated. °- Mitral valve: Calcified annulus. °- Pulmonary arteries: Systolic pressure was mildly increased. °  °Impressions: °  °- Normal LV systolic function; mild diastolic dysfunction; mild °  LVH; calcified aortic valve with mild AI; mildly dilated °  ascending aorta; mild TR; mild pulmonary hypertension. ° °Heart Catheterization: none  °   °Assessment & Plan:  ° °  ICD-10-CM   °1. Interstitial pulmonary disease (HCC)  J84.9   °2. Pulmonary fibrosis (HCC)  J84.10   °3. ILD (interstitial lung disease) (HCC)  J84.9   °4. Rheumatoid arthritis involving multiple sites with positive rheumatoid factor (HCC)  M05.79   °5. Cyclic citrullinated peptide (CCP) antibody positive  R79.89   ° ° °Discussion: ° °85-year-old female history of RA possible RA ILD, possible drug-induced injury, amiodarone, presumed diagnosis after cessation of drug.  Overall doubt with progressive fibrotic change within the lung parenchyma.  Was started on OFEV full dose and ultimately had to dose reduced 200 mg a day secondary to diarrhea. ° °We do need to be mindful of risk benefits of continuation of anti-fibrotic due to the risk of development of dehydration secondary to diarrhea and patient's CKD 3 stage.  I  agree with Dr. Gottschalk on being very observant for this.  At this time it does appear she is tolerating this well. ° °The second thing I am concerned about is her ongoing weight loss.  I encouraged her to increase her daily calorie intake as well as addition of a supplement such as Ensure daily. ° °For the time being we will continue OFEV at dose reduction, continue Imodium, continue Zofran for as   needed nausea. Continue Imuran per rheumatology. Continue to stay physically active.  Repeat HRCT should be scheduled for October.  They should be calling her to get this set up.  Return to clinic in 3 months.  Please call if there is any changes in symptomatology.  Or side effects from medication become more difficult to control.  Greater than 50% of this patient's 25-minute office was been face-to-face discussing above recommendations treatment plan.   Current Outpatient Medications:    apixaban (ELIQUIS) 2.5 MG TABS tablet, Take 1 tablet (2.5 mg total) by mouth 2 (two) times daily., Disp: 180 tablet, Rfl: 3   azaTHIOprine (IMURAN) 50 MG tablet, Take 50 mg by mouth daily. , Disp: , Rfl:    calcium-vitamin D (OSCAL WITH D) 250-125 MG-UNIT per tablet, Take 1 tablet by mouth 2 (two) times daily., Disp: , Rfl:    Cyanocobalamin (VITAMIN B-12 IJ), Inject once a month, Disp: , Rfl:    diltiazem (CARDIZEM CD) 300 MG 24 hr capsule, Take 1 capsule (300 mg total) by mouth daily., Disp: 90 capsule, Rfl: 3   folic acid (FOLVITE) 1 MG tablet, Take 1 mg by mouth daily. , Disp: , Rfl:    furosemide (LASIX) 20 MG tablet, TAKE 1 TO 2 TABLETS BY  MOUTH DAILY AS NEEDED, Disp: 180 tablet, Rfl: 1   gabapentin (NEURONTIN) 100 MG capsule, Take 100 mg by mouth at bedtime. , Disp: , Rfl:    levothyroxine (SYNTHROID, LEVOTHROID) 75 MCG tablet, TAKE 1 TABLET BY MOUTH  DAILY, Disp: 90 tablet, Rfl: 3   lisinopril (PRINIVIL,ZESTRIL) 20 MG tablet, Take 1 tablet (20 mg total) by mouth daily. (Patient taking  differently: Take 20 mg by mouth every evening. ), Disp: 90 tablet, Rfl: 3   lisinopril-hydrochlorothiazide (PRINZIDE,ZESTORETIC) 20-12.5 MG tablet, Take 1 tablet by mouth daily. (Patient taking differently: Take 1 tablet by mouth every morning. ), Disp: 90 tablet, Rfl: 3   Melatonin 5 MG TABS, Take 5 mg by mouth at bedtime., Disp: , Rfl:    Multiple Vitamin (MULTIVITAMIN) capsule, Take 1 capsule by mouth daily., Disp: , Rfl:    Nintedanib (OFEV) 100 MG CAPS, Take 1 capsule (100 mg total) by mouth 2 (two) times daily. This prescription sent due to the 150 mg BID causing diarrhea. This prescription for 100 mg BID sent at Dr. Matilde Bash request. (Patient taking differently: Take 100 mg by mouth 2 (two) times daily. This prescription for 100 mg BID sent at Dr. Matilde Bash request. (decreased from 186m due to diarrhea)), Disp: 60 capsule, Rfl: 5   ondansetron (ZOFRAN-ODT) 4 MG disintegrating tablet, DISSOLVE 1 TABLET IN MOUTH EVERY 8 HOURS AS NEEDED FOR NAUSEA AND VOMITING, Disp: 30 tablet, Rfl: 0   potassium chloride SA (K-DUR) 20 MEQ tablet, TAKE 1 TABLET BY MOUTH  DAILY, Disp: 90 tablet, Rfl: 0  Current Facility-Administered Medications:    cyanocobalamin ((VITAMIN B-12)) injection 1,000 mcg, 1,000 mcg, Intramuscular, Q30 days, MChipper Herb MD, 1,000 mcg at 07/20/19 1[employer]pineIcard, DO [facility] 07/30/2019 11:40 AM

## 2019-07-30 NOTE — Patient Instructions (Addendum)
Thank you for visiting Dr. Valeta Harms at [facility]. Today we recommend the following:  Stay on same regimen.  Planned CT Scan in October.  We will follow up on getting this scheduled.   Return in about 3 months (around 10/29/2019).    Please do your part to reduce the spread of COVID-19.

## 2019-08-06 ENCOUNTER — Other Ambulatory Visit: Payer: Self-pay

## 2019-08-09 ENCOUNTER — Ambulatory Visit (INDEPENDENT_AMBULATORY_CARE_PROVIDER_SITE_OTHER): Payer: Medicare Other | Admitting: Family Medicine

## 2019-08-09 ENCOUNTER — Encounter: Payer: Self-pay | Admitting: Family Medicine

## 2019-08-09 ENCOUNTER — Other Ambulatory Visit: Payer: Self-pay

## 2019-08-09 VITALS — BP 138/81 | HR 96 | Temp 97.9°F | Ht 60.0 in | Wt 113.0 lb

## 2019-08-09 DIAGNOSIS — M67912 Unspecified disorder of synovium and tendon, left shoulder: Secondary | ICD-10-CM | POA: Diagnosis not present

## 2019-08-09 DIAGNOSIS — S81801A Unspecified open wound, right lower leg, initial encounter: Secondary | ICD-10-CM

## 2019-08-09 DIAGNOSIS — S51012A Laceration without foreign body of left elbow, initial encounter: Secondary | ICD-10-CM

## 2019-08-09 DIAGNOSIS — N183 Chronic kidney disease, stage 3 unspecified: Secondary | ICD-10-CM

## 2019-08-09 DIAGNOSIS — N179 Acute kidney failure, unspecified: Secondary | ICD-10-CM

## 2019-08-09 NOTE — Progress Notes (Signed)
Subjective: CC: Follow-up acute on chronic kidney injury, wound PCP: [name], DO WLN:[id number] Susan Fitzgerald is a 85 y.o. female presenting to clinic today for:  1.  Acute on chronic kidney injury Patient noted to have a rise in her creatinine to 1.6 on her labs in August.  This was thought to be secondary to diarrhea provoked by her pulmonary medications.  She has history of interstitial pulmonary disease and is followed by Dr. Valeta Harms for this.  She notes that they reduced her dose from 150 to 100 mg and that she does feel that her symptoms are a little less prevalent.  The diarrhea has essentially resolved and if anything she is constipated now.  She goes on to state that she has nausea fairly regularly and treats herself with Zofran.  She schedules her medications after meals.  2.  Skin tears Patient reports that she sustained 2 separate skin tears at different occasions.  The one on her right anterior leg was about 2 months ago.  She notes that it is healing but seems to be taking a long time and is made quite a bit of scab that does not seem like it is "ready to come off".  She is not applying any thing specific to it but wanted me to have a look at it. The lesion that is on her left elbow occurred more recently after she bumped against a piece of furniture.  She applied liquid Band-Aid to the affected area but notes that she did not take a great deal of time to clean it.  3.  Shoulder pain Patient reports left-sided shoulder pain particularly with trying to reach behind her back.  She notes that she has a great golf swing and does not have problems playing golf.  Does not report any significant weakness.  No treatments thus far but when she ran this by a different specialist they thought it might be her rotator cuff.  She does not want to pursue any surgical intervention.  ROS: Per HPI  Allergies  Allergen Reactions  . Benazepril Hcl Cough   Past Medical History:  Diagnosis Date  .  Arthritis   . Bradycardia 2014  . Calcification of bronchial airway 06/23/2018   01/2017 - See on CT imaging of the chest   . Cataract   . Cerebral vascular disease 06/19/2018   06/19/2018 - MRI with chronic microvascular ischemic change  . Diverticulosis   . Dyspnea    periodically  . Dysrhythmia    a - fib  . Heart murmur    ??? bruit  . History of stress test    a. 04/09/13-nuclear stress-no ischemia low risk  . Hypertension   . Menopause   . Osteoporosis   . PAF (paroxysmal atrial fibrillation) ([place])    a. s/p prior DCCV; b. On flecainide & coumadin (CHA2DS2VASc = 4);  c. 03/2016 Echo: EF 55-60%, mod LVH, mod AI, mild to mod TR, PASP 90mmHg.  Marland Kitchen Pelvic fracture (HCC)     Current Outpatient Medications:  .  apixaban (ELIQUIS) 2.5 MG TABS tablet, Take 1 tablet (2.5 mg total) by mouth 2 (two) times daily., Disp: 180 tablet, Rfl: 3 .  azaTHIOprine (IMURAN) 50 MG tablet, Take 50 mg by mouth daily. , Disp: , Rfl:  .  calcium-vitamin D (OSCAL WITH D) 250-125 MG-UNIT per tablet, Take 1 tablet by mouth 2 (two) times daily., Disp: , Rfl:  .  Cyanocobalamin (VITAMIN B-12 IJ), Inject once a month, Disp: ,  Rfl:  .  diltiazem (CARDIZEM CD) 300 MG 24 hr capsule, Take 1 capsule (300 mg total) by mouth daily., Disp: 90 capsule, Rfl: 3 .  folic acid (FOLVITE) 1 MG tablet, Take 1 mg by mouth daily. , Disp: , Rfl:  .  furosemide (LASIX) 20 MG tablet, TAKE 1 TO 2 TABLETS BY  MOUTH DAILY AS NEEDED, Disp: 180 tablet, Rfl: 1 .  gabapentin (NEURONTIN) 100 MG capsule, Take 100 mg by mouth at bedtime. , Disp: , Rfl:  .  levothyroxine (SYNTHROID, LEVOTHROID) 75 MCG tablet, TAKE 1 TABLET BY MOUTH  DAILY, Disp: 90 tablet, Rfl: 3 .  lisinopril (PRINIVIL,ZESTRIL) 20 MG tablet, Take 1 tablet (20 mg total) by mouth daily. (Patient taking differently: Take 20 mg by mouth every evening. ), Disp: 90 tablet, Rfl: 3 .  lisinopril-hydrochlorothiazide (PRINZIDE,ZESTORETIC) 20-12.5 MG tablet, Take 1 tablet by mouth daily.  (Patient taking differently: Take 1 tablet by mouth every morning. ), Disp: 90 tablet, Rfl: 3 .  Melatonin 5 MG TABS, Take 5 mg by mouth at bedtime., Disp: , Rfl:  .  Multiple Vitamin (MULTIVITAMIN) capsule, Take 1 capsule by mouth daily., Disp: , Rfl:  .  Nintedanib (OFEV) 100 MG CAPS, Take 1 capsule (100 mg total) by mouth 2 (two) times daily. This prescription sent due to the 150 mg BID causing diarrhea. This prescription for 100 mg BID sent at Dr. Matilde Bash request. (Patient taking differently: Take 100 mg by mouth 2 (two) times daily. This prescription for 100 mg BID sent at Dr. Matilde Bash request. (decreased from 150mg  due to diarrhea)), Disp: 60 capsule, Rfl: 5 .  ondansetron (ZOFRAN-ODT) 4 MG disintegrating tablet, DISSOLVE 1 TABLET IN MOUTH EVERY 8 HOURS AS NEEDED FOR NAUSEA AND VOMITING, Disp: 30 tablet, Rfl: 0 .  potassium chloride SA (K-DUR) 20 MEQ tablet, TAKE 1 TABLET BY MOUTH  DAILY, Disp: 90 tablet, Rfl: 0  Current Facility-Administered Medications:  .  cyanocobalamin ((VITAMIN B-12)) injection 1,000 mcg, 1,000 mcg, Intramuscular, Q30 days, Chipper Herb, MD, 1,000 mcg at 07/20/19 1540 Social History   Socioeconomic History  . Marital status: Married    Spouse name: Rush Landmark   . Number of children: 2  . Years of education: Not on file  . Highest education level: Not on file  Occupational History  . Not on file  Social Needs  . Financial resource strain: Not hard at all  . Food insecurity    Worry: Never true    Inability: Never true  . Transportation needs    Medical: No    Non-medical: No  Tobacco Use  . Smoking status: Never Smoker  . Smokeless tobacco: Never Used  Substance and Sexual Activity  . Alcohol use: No  . Drug use: No  . Sexual activity: Yes  Lifestyle  . Physical activity    Days per week: 2 days    Minutes per session: 60 min  . Stress: Only a little  Relationships  . Social connections    Talks on phone: More than three times a week    Gets  together: More than three times a week    Attends religious service: More than 4 times per year    Active member of club or organization: Yes    Attends meetings of clubs or organizations: More than 4 times per year    Relationship status: Married  . Intimate partner violence    Fear of current or ex partner: No    Emotionally abused: No  Physically abused: No    Forced sexual activity: No  Other Topics Concern  . Not on file  Social History Narrative  . Not on file   Family History  Problem Relation Age of Onset  . Stroke Mother        cerebral hemorrhage  . Heart disease Father        MI  . Heart attack Father   . Vision loss Father   . Heart disease Brother   . Heart attack Brother   . Cancer Maternal Aunt        breast  . Cancer Brother   . Heart disease Brother   . Cancer Daughter        breast    Objective: Office vital signs reviewed. BP 138/81   Pulse 96   Temp 97.9 F (36.6 C) (Temporal)   Ht 5' (1.524 m)   Wt 113 lb (51.3 kg)   BMI 22.07 kg/m   Physical Examination:  General: Awake, alert, well nourished, No acute distress HEENT: Normal, sclera white, MMM Cardio: irregularly irregular but rate controlled. S1S2 heard, no murmurs appreciated Pulm: clear to auscultation bilaterally, no wheezes, rhonchi or rales; normal work of breathing on room air MSK: She has decreased active range of motion in internal rotation of the left shoulder.  No visible deformities noted. Skin: She has quite a bit of hyperpigmentation/venous stasis changes noted along the right lower extremity greater than left lower extremity.  On the right anterior shin she has an eschar noted.  On the left elbow she has a healing skin tear with glue still in place.  No evidence of secondary infection.  Assessment/ Plan: 85 y.o. female   1. AKI (acute kidney injury) (HCC) Check BMP.  I encouraged hydration - Basic Metabolic Panel  2. Stage 3 chronic kidney disease ([place])  3. Skin tear  of left elbow without complication, initial encounter No evidence of secondary infection.  I gave her Vaseline bandages to apply to the affected area and recommended that she keep the area protected.  4. Leg wound, right, initial encounter The lesion on the right lower extremity is appears to be an eschar formation.  We discussed consideration for debridement if no significant improvement with wound care at home.  She will contact me if referral is needed.  5. Rotator cuff disorder, left I suspect that she likely has rotator cuff injury plus or minus mild adhesive capsulitis.  I advised her that it may be worth having checked out under ultrasound with sports medicine.  I gave her the information for Dr. Micheline Chapman in [place].  Should she need a formal referral she will contact me.   No orders of the defined types were placed in this encounter.  No orders of the defined types were placed in this encounter.    [name], DO [place] [phone]

## 2019-08-09 NOTE — Patient Instructions (Addendum)
You had labs performed today.  You will be contacted with the results of the labs once they are available, usually in the next 3 business days for routine lab work.  If you have an active my chart account, they will be released to your MyChart.  If you prefer to have these labs released to you via telephone, please let us know.  Apply the vaseline gauze to the wounds.  If no improvement, I recommend re-evaluation with dermatology.  Dr Micheline Chapman with Sports Medicine in [place] is who can look at your shoulder under ultrasound. [address] [place], [place] [address] Phone: [phone]

## 2019-08-19 ENCOUNTER — Ambulatory Visit: Payer: Medicare Other

## 2019-08-19 ENCOUNTER — Other Ambulatory Visit: Payer: Self-pay

## 2019-08-20 ENCOUNTER — Ambulatory Visit (INDEPENDENT_AMBULATORY_CARE_PROVIDER_SITE_OTHER): Payer: Medicare Other

## 2019-08-20 DIAGNOSIS — L659 Nonscarring hair loss, unspecified: Secondary | ICD-10-CM | POA: Diagnosis not present

## 2019-08-20 DIAGNOSIS — E538 Deficiency of other specified B group vitamins: Secondary | ICD-10-CM

## 2019-08-20 NOTE — Progress Notes (Signed)
Cyanocobalamin given to right deltoid.  Patient tolerated well.

## 2019-08-23 ENCOUNTER — Telehealth: Payer: Self-pay | Admitting: Cardiology

## 2019-08-23 DIAGNOSIS — Z79899 Other long term (current) drug therapy: Secondary | ICD-10-CM

## 2019-08-23 NOTE — Telephone Encounter (Signed)
S/w pt she states that she will go to her Family medicine office in the am to have this drawn. She will call if there are any issues

## 2019-08-23 NOTE — Telephone Encounter (Signed)
Returned call to patient of Dr. Percival Spanish who reports blood in stool (bright red blood) since Saturday AM which occurred 3 times in total with BMs. She reports 2 loose BMs yesterday but no blood with this. She reports blood when wiping on toilet tissue and that the bleeding would "drip". She has ongoing hemorrhoids, with no pain, no bleeding previously. She held her eliquis yesterday. She reports Ofev causes her diarrhea - she may contact pulmonary about this as well.   She saw Dr. Henrene Pastor with GI July 2019 for similar issues - she was scheduled for colonoscopy at this time but cancelled it.   Advised will route to [facility] & Dr. Percival Spanish to advise on eliquis. Suggested she also contact GI doctor as well

## 2019-08-23 NOTE — Telephone Encounter (Signed)
CBC ordered  LM that recommendations will be sent to MyChart

## 2019-08-23 NOTE — Telephone Encounter (Signed)
Needs CBC.  Can hold Eliquis until we get this result.

## 2019-08-23 NOTE — Telephone Encounter (Signed)
New message   Patient states that she is having in blood in stool. Patient wants to know if this could be caused by apixaban (ELIQUIS) 2.5 MG TABS tablet. Please call.

## 2019-08-24 ENCOUNTER — Ambulatory Visit: Payer: Medicare Other | Admitting: Family Medicine

## 2019-08-24 ENCOUNTER — Encounter: Payer: Self-pay | Admitting: Family Medicine

## 2019-08-24 ENCOUNTER — Telehealth: Payer: Self-pay | Admitting: Pulmonary Disease

## 2019-08-24 ENCOUNTER — Ambulatory Visit (INDEPENDENT_AMBULATORY_CARE_PROVIDER_SITE_OTHER): Payer: Medicare Other | Admitting: Family Medicine

## 2019-08-24 ENCOUNTER — Telehealth: Payer: Self-pay | Admitting: Internal Medicine

## 2019-08-24 DIAGNOSIS — Z7901 Long term (current) use of anticoagulants: Secondary | ICD-10-CM | POA: Diagnosis not present

## 2019-08-24 DIAGNOSIS — J849 Interstitial pulmonary disease, unspecified: Secondary | ICD-10-CM | POA: Diagnosis not present

## 2019-08-24 DIAGNOSIS — K921 Melena: Secondary | ICD-10-CM

## 2019-08-24 DIAGNOSIS — Z79899 Other long term (current) drug therapy: Secondary | ICD-10-CM | POA: Diagnosis not present

## 2019-08-24 NOTE — Progress Notes (Signed)
Virtual Visit via Telephone Note  I connected with Susan Fitzgerald on 08/24/19 at 8:54 AM by telephone and verified that I am speaking with the correct person using two identifiers. Susan Fitzgerald is currently located at home and nobody is currently with her during this visit. The provider, Loman Brooklyn, FNP is located in their home at time of visit.  I discussed the limitations, risks, security and privacy concerns of performing an evaluation and management service by telephone and the availability of in person appointments. I also discussed with the patient that there may be a patient responsible charge related to this service. The patient expressed understanding and agreed to proceed.  Subjective: PCP: [name], DO  Chief Complaint  Patient presents with  . Rectal Bleeding   Patient reports Saturday morning there was quite a bit of blood in her stool and it was dripping from her bottom. The blood was bright red in color. She has multiple bowel movements a day which she reports are diarrhea which is caused by the Ofev. She has not seen any blood today. She does have a history of hemorrhoids. Denies dizziness, shortness of breath, rapid heart rate, or fatigue. She also takes Eliquis for atrial fibrillation. Patient has contacted her cardiologist who advised that she hold the Eliquis and have CBC drawn, which will be done at our office today. She tried to call her pulmonologist regarding the Ofev but was unable to get through. She spoke to her pharmacist who recommended she hold the Ofev due to side effects of bleeding. She was scheduled for a colonoscopy in the summer of last year but cancelled it as she did not feel she could go through with the prep and the "staining in her underwear" resolved.    ROS: Per HPI  Current Outpatient Medications:  .  apixaban (ELIQUIS) 2.5 MG TABS tablet, Take 1 tablet (2.5 mg total) by mouth 2 (two) times daily., Disp: 180 tablet, Rfl: 3 .   azaTHIOprine (IMURAN) 50 MG tablet, Take 50 mg by mouth daily. , Disp: , Rfl:  .  calcium-vitamin D (OSCAL WITH D) 250-125 MG-UNIT per tablet, Take 1 tablet by mouth 2 (two) times daily., Disp: , Rfl:  .  Cyanocobalamin (VITAMIN B-12 IJ), Inject once a month, Disp: , Rfl:  .  diltiazem (CARDIZEM CD) 300 MG 24 hr capsule, Take 1 capsule (300 mg total) by mouth daily., Disp: 90 capsule, Rfl: 3 .  folic acid (FOLVITE) 1 MG tablet, Take 1 mg by mouth daily. , Disp: , Rfl:  .  furosemide (LASIX) 20 MG tablet, TAKE 1 TO 2 TABLETS BY  MOUTH DAILY AS NEEDED, Disp: 180 tablet, Rfl: 1 .  gabapentin (NEURONTIN) 100 MG capsule, Take 100 mg by mouth at bedtime. , Disp: , Rfl:  .  levothyroxine (SYNTHROID, LEVOTHROID) 75 MCG tablet, TAKE 1 TABLET BY MOUTH  DAILY, Disp: 90 tablet, Rfl: 3 .  lisinopril (PRINIVIL,ZESTRIL) 20 MG tablet, Take 1 tablet (20 mg total) by mouth daily. (Patient taking differently: Take 20 mg by mouth every evening. ), Disp: 90 tablet, Rfl: 3 .  lisinopril-hydrochlorothiazide (PRINZIDE,ZESTORETIC) 20-12.5 MG tablet, Take 1 tablet by mouth daily. (Patient taking differently: Take 1 tablet by mouth every morning. ), Disp: 90 tablet, Rfl: 3 .  Melatonin 5 MG TABS, Take 5 mg by mouth at bedtime., Disp: , Rfl:  .  Multiple Vitamin (MULTIVITAMIN) capsule, Take 1 capsule by mouth daily., Disp: , Rfl:  .  Nintedanib (OFEV)  100 MG CAPS, Take 1 capsule (100 mg total) by mouth 2 (two) times daily. This prescription sent due to the 150 mg BID causing diarrhea. This prescription for 100 mg BID sent at Dr. Matilde Bash request. (Patient taking differently: Take 100 mg by mouth 2 (two) times daily. This prescription for 100 mg BID sent at Dr. Matilde Bash request. (decreased from 150mg  due to diarrhea)), Disp: 60 capsule, Rfl: 5 .  ondansetron (ZOFRAN-ODT) 4 MG disintegrating tablet, DISSOLVE 1 TABLET IN MOUTH EVERY 8 HOURS AS NEEDED FOR NAUSEA AND VOMITING, Disp: 30 tablet, Rfl: 0 .  potassium chloride SA (K-DUR)  20 MEQ tablet, TAKE 1 TABLET BY MOUTH  DAILY, Disp: 90 tablet, Rfl: 0  Current Facility-Administered Medications:  .  cyanocobalamin ((VITAMIN B-12)) injection 1,000 mcg, 1,000 mcg, Intramuscular, Q30 days, Chipper Herb, MD, 1,000 mcg at 08/20/19 1436  Allergies  Allergen Reactions  . Benazepril Hcl Cough   Past Medical History:  Diagnosis Date  . Arthritis   . Bradycardia 2014  . Calcification of bronchial airway 06/23/2018   01/2017 - See on CT imaging of the chest   . Cataract   . Cerebral vascular disease 06/19/2018   06/19/2018 - MRI with chronic microvascular ischemic change  . Diverticulosis   . Dyspnea    periodically  . Dysrhythmia    a - fib  . Heart murmur    ??? bruit  . History of stress test    a. 04/09/13-nuclear stress-no ischemia low risk  . Hypertension   . Menopause   . Osteoporosis   . PAF (paroxysmal atrial fibrillation) ([place])    a. s/p prior DCCV; b. On flecainide & coumadin (CHA2DS2VASc = 4);  c. 03/2016 Echo: EF 55-60%, mod LVH, mod AI, mild to mod TR, PASP 17mmHg.  Marland Kitchen Pelvic fracture (HCC)     Observations/Objective: A&O  No respiratory distress or wheezing audible over the phone Mood, judgement, and thought processes all WNL  Assessment and Plan: 1. Frank blood in stool - Continue to hold Eliquis and Ofev. CBC today. Discussed STAT referral to GI due to bleeding. Discussed symptoms that would warrant her to go to the ER.  - Ambulatory referral to Gastroenterology  2. Anticoagulant long-term use - Hold Eliquis due to GI bleeding. F/U with cardiology after CBC results.  - Ambulatory referral to Gastroenterology  3. Interstitial pulmonary disease ([place]) - Hold Ofev due to GI bleeding. Patient to contact pulmonologist today.  - Ambulatory referral to Gastroenterology   Follow Up Instructions:  I discussed the assessment and treatment plan with the patient. The patient was provided an opportunity to ask questions and all were answered. The patient  agreed with the plan and demonstrated an understanding of the instructions.   The patient was advised to call back or seek an in-person evaluation if the symptoms worsen or if the condition fails to improve as anticipated.  The above assessment and management plan was discussed with the patient. The patient verbalized understanding of and has agreed to the management plan. Patient is aware to call the clinic if symptoms persist or worsen. Patient is aware when to return to the clinic for a follow-up visit. Patient educated on when it is appropriate to go to the emergency department.   Time call ended: 9:12 AM  I provided 20 minutes of non-face-to-face time during this encounter.  Hendricks Limes, MSN, APRN, FNP-C [facility] 08/24/19

## 2019-08-24 NOTE — Patient Instructions (Signed)
Gastrointestinal Bleeding °Gastrointestinal (GI) bleeding is bleeding somewhere along the digestive tract, between the mouth and the anus. This tract includes the mouth, esophagus, stomach, small intestine, large intestine, and anus. The large intestine is often called the colon. °GI bleeding can be caused by various problems. The severity of these problems can range from mild to serious or even life-threatening. If you have GI bleeding, you may find blood in your stools (feces), you may have black stools, or you may vomit blood. If there is a lot of bleeding, you may need to stay in the hospital. °What are the causes? °This condition may be caused by: °· Inflammation, irritation, or swelling of the esophagus (esophagitis). The esophagus is part of the body that moves food from your mouth to your stomach. °· Swollen veins in the rectum (hemorrhoids). °· Areas of painful tearing in the anus that are often caused by passing hard stool (anal fissures). °· Pouches that form on the colon over time, with age, and may bleed a lot (diverticulosis). °· Inflammation (diverticulitis) in areas with diverticulosis. This can cause pain, fever, and bloody stools, although bleeding may be mild. °· Growths (polyps) or cancer. Colon cancer often starts out as precancerous polyps. °· Gastritis and ulcers. With these, bleeding may come from the upper GI tract, near the stomach. °What increases the risk? °You are more likely to develop this condition if you: °· Have an infection in your stomach from a type of bacteria called Helicobacter pylori. °· Take certain medicines, such as: °? NSAIDs. °? Aspirin. °? Selective serotonin reuptake inhibitors (SSRIs). °? Steroids. °? Antiplatelet or anticoagulant medicines. °· Smoke. °· Drink alcohol. °What are the signs or symptoms? °Common symptoms of this condition include: °· Bright red blood in your vomit, or vomit that looks like coffee grounds. °· Bloody, black, or tarry stools. °? Bleeding  from the lower GI tract will usually cause red or maroon blood in the stools. °? Bleeding from the upper GI tract may cause black, tarry stools that are often stronger smelling than usual. °? In certain cases, if the bleeding is fast enough, the stools may be red. °· Pain or cramping in the abdomen. °How is this diagnosed? °This condition may be diagnosed based on: °· Your medical history and a physical exam. °· Various tests, such as: °? Blood tests. °? Stool tests. °? X-rays and other imaging tests. °? Esophagogastroduodenoscopy (EGD). In this test, a flexible, lighted tube is used to look at your esophagus, stomach, and small intestine. °? Colonoscopy. In this test, a flexible, lighted tube is used to look at your colon. °How is this treated? °Treatment for this condition depends on the cause of the bleeding. For example: °· For bleeding from the esophagus, stomach, small intestine, or colon, the health care provider doing your EGD or colonoscopy may be able to stop the bleeding as part of the procedure. °· Inflammation or infection of the colon can be treated with medicines. °· Certain rectal problems can be treated with creams, suppositories, or warm baths. °· Medicines may be given to reduce acid in your stomach. °· Surgery is sometimes needed. °· Blood transfusions are sometimes needed if a lot of blood has been lost. °If bleeding is mild, you may be allowed to go home. If there is a lot of bleeding, you will need to stay in the hospital for observation. °Follow these instructions at home: ° °· Take over-the-counter and prescription medicines only as told by your health care provider. °·   Eat foods that are high in fiber, such as beans, whole grains, and fresh fruits and vegetables. This will help to keep your stools soft. Eating 1-3 prunes each day works well for many people. °· Drink enough fluid to keep your urine pale yellow. °· Keep all follow-up visits as told by your health care provider. This is  important. °Contact a health care provider if: °· Your symptoms do not improve. °Get help right away if: °· Your bleeding does not stop. °· You feel light-headed or you faint. °· You feel weak. °· You have severe cramps in your back or abdomen. °· You pass large blood clots in your stool. °· Your symptoms are getting worse. °· You have chest pain or fast heartbeats. °Summary °· Gastrointestinal (GI) bleeding is bleeding somewhere along the digestive tract, between the mouth and anus. GI bleeding can be caused by various problems. The severity of these problems can range from mild to serious or even life-threatening. °· Treatment for this condition depends on the cause of the bleeding. °· Take over-the-counter and prescription medicines only as told by your health care provider. °· Keep all follow-up visits as told by your health care provider. This is important. °· Get help right away if your bleeding increases, your symptoms are getting worse, or you have new symptoms. °This information is not intended to replace advice given to you by your health care provider. Make sure you discuss any questions you have with your health care provider. °Document Released: 11/01/2000 Document Revised: 06/17/2018 Document Reviewed: 06/17/2018 °Elsevier Patient Education © 2020 [employer] ° °

## 2019-08-24 NOTE — Telephone Encounter (Signed)
Pt is being referred for Frank blood in stool. First available appt with APP is on 10/14, pt would like something sooner than that, she states that she has been passing blood. Pls call her.

## 2019-08-24 NOTE — Telephone Encounter (Signed)
Spoke with pt. States that she thinks that she is having side effects of OFEV. Reports diarrhea, bloody stool and rectal bleeding. She is having about 5 bowel movements per day. The blood is on the toilet paper and in the toilet with her stool. Reports that she stopped OFEV Sunday morning. Pt has also stopped her Eliquis per Dr. Percival Spanish. She has called all of her physicians about this issue >> PCP, Cardiology and now [place]. Pt would like Dr. Juline Patch recommendations.  Dr. Valeta Harms - please advise. Thanks!

## 2019-08-24 NOTE — Telephone Encounter (Signed)
Pt.notified

## 2019-08-24 NOTE — Telephone Encounter (Signed)
Pt scheduled to see Dr. Henrene Pastor 08/26/19 at 8:40am, please notify pt of appt date and time.

## 2019-08-24 NOTE — Telephone Encounter (Signed)
Spoke with the pt and notified of recs per Dr. Valeta Harms  Pt verbalized understanding  Nothing further needed

## 2019-08-24 NOTE — Telephone Encounter (Signed)
PCCM:  Agree. Hold OFEV. Stay hydrated.  Drink plenty of fluids and not only water.  Include fluids with solute like gatorade or pedilyte   Garner Nash, DO [facility] 08/24/2019 9:56 AM

## 2019-08-25 ENCOUNTER — Telehealth: Payer: Self-pay | Admitting: Cardiology

## 2019-08-25 LAB — CBC
Hematocrit: 37.5 % (ref 34.0–46.6)
Hemoglobin: 12.7 g/dL (ref 11.1–15.9)
MCH: 34 pg — ABNORMAL HIGH (ref 26.6–33.0)
MCHC: 33.9 g/dL (ref 31.5–35.7)
MCV: 100 fL — ABNORMAL HIGH (ref 79–97)
Platelets: 160 10*3/uL (ref 150–450)
RBC: 3.74 x10E6/uL — ABNORMAL LOW (ref 3.77–5.28)
RDW: 12.5 % (ref 11.7–15.4)
WBC: 7.1 10*3/uL (ref 3.4–10.8)

## 2019-08-25 NOTE — Telephone Encounter (Signed)
Yes restart and if bleeding happens again she should talk to [name], DO

## 2019-08-25 NOTE — Telephone Encounter (Signed)
New Message  Patient is calling in for lab results. Please give patient a call back.

## 2019-08-25 NOTE — Telephone Encounter (Signed)
Patient made aware of results and verbalized understanding.  Notes recorded by Minus Breeding, MD on 08/25/2019 at 1:33 PM EDT  Labs OK. Call Ms. Bubb with the results and send results to [name], DO.  She stated that she has been holding the Eliquis since Sunday due to rectal bleeding. The bleeding has stopped for the last 2 days. She would like to know if she should start the Eliquis again.  She also stated that she has been off of the Ofev which has been known to cause bleeding.   She has an appointment with Dr. Henrene Pastor, GI, in the morning to discuss the bleeding.

## 2019-08-26 ENCOUNTER — Telehealth: Payer: Self-pay | Admitting: Internal Medicine

## 2019-08-26 ENCOUNTER — Ambulatory Visit: Payer: Medicare Other | Admitting: Internal Medicine

## 2019-08-26 ENCOUNTER — Other Ambulatory Visit: Payer: Self-pay

## 2019-08-26 ENCOUNTER — Encounter: Payer: Self-pay | Admitting: Internal Medicine

## 2019-08-26 VITALS — BP 112/62 | HR 90 | Temp 97.7°F | Ht 60.0 in | Wt 114.0 lb

## 2019-08-26 DIAGNOSIS — Z800 Family history of malignant neoplasm of digestive organs: Secondary | ICD-10-CM

## 2019-08-26 DIAGNOSIS — K219 Gastro-esophageal reflux disease without esophagitis: Secondary | ICD-10-CM

## 2019-08-26 DIAGNOSIS — R194 Change in bowel habit: Secondary | ICD-10-CM | POA: Diagnosis not present

## 2019-08-26 DIAGNOSIS — R142 Eructation: Secondary | ICD-10-CM

## 2019-08-26 DIAGNOSIS — Z7901 Long term (current) use of anticoagulants: Secondary | ICD-10-CM

## 2019-08-26 DIAGNOSIS — K625 Hemorrhage of anus and rectum: Secondary | ICD-10-CM | POA: Diagnosis not present

## 2019-08-26 NOTE — Telephone Encounter (Signed)
Dr. Henrene Pastor please see note below and advise.

## 2019-08-26 NOTE — Telephone Encounter (Signed)
Pt had OV today and is scheduled for a colonoscopy.  She reported that she has been belching often and would like to schedule an EGD as well.  Please advise.

## 2019-08-26 NOTE — Telephone Encounter (Signed)
The patient has verbalized her understanding.

## 2019-08-26 NOTE — Progress Notes (Signed)
HISTORY OF PRESENT ILLNESS:  Susan Fitzgerald is a 85 y.o. female, golf enthusiast, who is sent today by her primary care provider regarding significant transient rectal bleeding.  Patient was initially seen in this office June 03, 2018 regarding minor rectal bleeding and Hemoccult positive stool.  She does have a family history of colon cancer and has undergone multiple prior colonoscopies in the past.  Her last examination with Dr. Teena Irani was November 2011 revealing diverticulosis only.  Patient was scheduled for colonoscopy last year though this was canceled due to other medical issues.  She does have a history of chronic atrial fibrillation for which she is on Eliquis.  As well, mild pulmonary fibrosis for which she was taken off of amiodarone and is currently on OFEV.  Since starting this medication she has had problems with nausea and diarrhea.  She believes the medication is responsible for her bleeding.  Her PCP stopped Eliquis.  Her multiple bleeding episodes occurred 5 and 6 days ago.  Has had nothing for the past 2 days.  She resumed Eliquis therapy on her own.  She denies abdominal pain.  Change in bowel habits as described.  GI review of systems is otherwise negative.  Review of outside x-rays include ultrasound from June 2018 which revealed no abdominal abnormalities.  Review of outside blood work from July 02, 2019 shows unremarkable CBC with hemoglobin 12.2.  She remains active playing golf.  REVIEW OF SYSTEMS:  All non-GI ROS negative unless otherwise stated in the HPI except for fatigue, arthritis, back pain, irregular heartbeat, sleeping problems, shortness of breath on occasion  Past Medical History:  Diagnosis Date  . Arthritis   . Bradycardia 2014  . Calcification of bronchial airway 06/23/2018   01/2017 - See on CT imaging of the chest   . Cataract   . Cerebral vascular disease 06/19/2018   06/19/2018 - MRI with chronic microvascular ischemic change  . Diverticulosis   . Dyspnea     periodically  . Dysrhythmia    a - fib  . Heart murmur    ??? bruit  . History of stress test    a. 04/09/13-nuclear stress-no ischemia low risk  . Hypertension   . Menopause   . Osteoporosis   . PAF (paroxysmal atrial fibrillation) ([place])    a. s/p prior DCCV; b. On flecainide & coumadin (CHA2DS2VASc = 4);  c. 03/2016 Echo: EF 55-60%, mod LVH, mod AI, mild to mod TR, PASP 49mmHg.  Marland Kitchen Pelvic fracture [facility])     Past Surgical History:  Procedure Laterality Date  . BIOPSY BREAST    . BIOPSY BREAST     right & benign  . BREAST SURGERY    . CARDIOVERSION N/A 10/30/2016   Procedure: CARDIOVERSION;  Surgeon: Larey Dresser, MD;  Location: [facility] [place];  Service: Cardiovascular;  Laterality: N/A;  . CARDIOVERSION N/A 08/20/2017   Procedure: CARDIOVERSION;  Surgeon: Jerline Pain, MD;  Location: [facility];  Service: Cardiovascular;  Laterality: N/A;  . CATARACT EXTRACTION    . EYE SURGERY     cataracts  . TEE WITHOUT CARDIOVERSION N/A 10/30/2016   Procedure: TRANSESOPHAGEAL ECHOCARDIOGRAM (TEE);  Surgeon: Larey Dresser, MD;  Location: [place];  Service: Cardiovascular;  Laterality: N/A;  . TOTAL HIP ARTHROPLASTY Right 01/27/2017   Procedure: TOTAL HIP ARTHROPLASTY ANTERIOR APPROACH;  Surgeon: Rod Can, MD;  Location: [place];  Service: Orthopedics;  Laterality: Right;    Social History Susan Fitzgerald  reports that she  has never smoked. She has never used smokeless tobacco. She reports that she does not drink alcohol or use drugs.  family history includes Cancer in her brother, daughter, and maternal aunt; Heart attack in her brother and father; Heart disease in her brother, brother, and father; Stroke in her mother; Vision loss in her father.  Allergies  Allergen Reactions  . Benazepril Hcl Cough       PHYSICAL EXAMINATION: Vital signs: BP 112/62   Pulse 90   Temp 97.7 F (36.5 C)   Ht 5' (1.524 m)   Wt 114 lb (51.7 kg)   BMI 22.26 kg/m   Constitutional:  generally well-appearing, no acute distress Psychiatric: alert and oriented x3, cooperative Eyes: extraocular movements intact, anicteric, conjunctiva pink Mouth: oral pharynx moist, no lesions Neck: supple no lymphadenopathy Cardiovascular: heart is irregularly irregular, no murmur Lungs: clear to auscultation bilaterally Abdomen: soft, nontender, nondistended, no obvious ascites, no peritoneal signs, normal bowel sounds, no organomegaly Rectal: Deferred until colonoscopy Extremities: no clubbing, cyanosis, or lower extremity edema bilaterally Skin: no lesions on visible extremities Neuro: No focal deficits.  Cranial nerves intact  ASSESSMENT:  1.  Moderate transient rectal bleeding. 2.  Family history of colon cancer 3.  Last colonoscopy November 2011 with diverticulosis.  Rule out interval cancer 4.  Multiple medical problems including atrial fibrillation on Eliquis 5.  Change in bowel habits with loose stools secondary to OFEV.  Common side effect  PLAN:  1.  Colonoscopy.  The patient is HIGH RISK given her age, comorbidities, and the need to address chronic anticoagulation therapy.The nature of the procedure, as well as the risks, benefits, and alternatives were carefully and thoroughly reviewed with the patient. Ample time for discussion and questions allowed. The patient understood, was satisfied, and agreed to proceed. 2.  Patient was advised to hold Eliquis the day before the procedure and the day of the procedure.  Anticipate starting therapy immediately post procedure unless significant therapeutics required.  She understands the pros and cons of transient discontinuation of Eliquis therapy.

## 2019-08-26 NOTE — Patient Instructions (Signed)
You have been scheduled for a colonoscopy. Please follow written instructions given to you at your visit today.  Please pick up your prep supplies at the pharmacy within the next 1-3 days. If you use inhalers (even only as needed), please bring them with you on the day of your procedure.   

## 2019-08-26 NOTE — Telephone Encounter (Signed)
Okay to add on EGD.  Block the 10 AM slot that morning so that I do not overbook.  Please arrange and let the patient know.  Thanks

## 2019-08-27 NOTE — Telephone Encounter (Signed)
Labs done and patient back on Eliquis

## 2019-08-30 ENCOUNTER — Telehealth: Payer: Self-pay | Admitting: Internal Medicine

## 2019-08-30 ENCOUNTER — Encounter: Payer: Medicare Other | Admitting: Internal Medicine

## 2019-08-30 NOTE — Telephone Encounter (Signed)
Spoke with pt and let her know this should not cause a problem with her scheduled procedure.

## 2019-08-30 NOTE — Telephone Encounter (Signed)
Patient called- she has an colon/endo next Wed. and she said she saw on the paper that says if you go to the ER/Hopsital at all before procedure to let us know. She said that she is having a CT of her lungs done tomorrow at the hospital and did not know if that was okay. She said if she needs to push the CT to be able to still have her colon/endo she would. Said that if she does not answer her phone you can leave a message stating what she needed to do.

## 2019-08-31 ENCOUNTER — Ambulatory Visit (INDEPENDENT_AMBULATORY_CARE_PROVIDER_SITE_OTHER)
Admission: RE | Admit: 2019-08-31 | Discharge: 2019-08-31 | Disposition: A | Discharge status: Home / Self Care | Payer: Medicare Other | Source: Ambulatory Visit | Attending: Pulmonary Disease | Admitting: Pulmonary Disease

## 2019-08-31 ENCOUNTER — Other Ambulatory Visit: Payer: Self-pay

## 2019-08-31 DIAGNOSIS — J849 Interstitial pulmonary disease, unspecified: Secondary | ICD-10-CM | POA: Diagnosis not present

## 2019-08-31 DIAGNOSIS — R918 Other nonspecific abnormal finding of lung field: Secondary | ICD-10-CM | POA: Diagnosis not present

## 2019-09-02 ENCOUNTER — Telehealth: Payer: Self-pay

## 2019-09-02 NOTE — Telephone Encounter (Signed)
-----   Message from Marshell Garfinkel, MD sent at 09/01/2019  9:32 AM EDT ----- We can make office visit to discuss symptoms. Options are to hold off for a few weeks and restart at a lower dose on 100 mg bid with loperamide, zofran etc.   She can also get in touch with the patient education coordinator for [facility] who are great at helping patients through the symptoms. Let me know if you want me to co ordinate. Thanks  Praveen ----- Message ----- From: Garner Nash, DO Sent: 08/31/2019   2:18 PM EDT To: [name], DO, Marshell Garfinkel, MD, #  [place], I called and spoke with the patient regarding her CT scan results.  She was appreciative of the call.  Stable in comparison to her February images.  Praveen, I just want to let you know that she stopped her OFEV due to persistent diarrhea.  She actually also had bloody stools.  She is holding off at this time for restarting it.  She did want me to reach out to you to see if you had any additional suggestions.  Otherwise she is making plans to stay off of it.  Ashly, she is off the OFEV at this time. Too many GI symptoms.   Thanks,  BLI  Garner Nash, DO [facility] 08/31/2019 2:14 PM

## 2019-09-02 NOTE — Telephone Encounter (Signed)
Dr. Mannam please advise. °

## 2019-09-02 NOTE — Telephone Encounter (Signed)
LMTCB

## 2019-09-02 NOTE — Telephone Encounter (Signed)
Patient returning call.  [phone]

## 2019-09-03 NOTE — Telephone Encounter (Signed)
Call made to patient, made an appointment for 09/10/2019 with Dr. [name] Browner to discuss ofev. Nothing further needed at this time. She reports she has been off the ofev since October 4th. She reports she started having rectal bleeding and she does not feel it is the right medication for her. I made her aware this will be addressed during that virtual visit. She also mentioned she would like to compare to esbriet which her pharmacists told her has fewer side effects.   Nothing further needed at this time.

## 2019-09-06 ENCOUNTER — Telehealth: Payer: Self-pay | Admitting: Internal Medicine

## 2019-09-06 ENCOUNTER — Encounter: Payer: Self-pay | Admitting: Internal Medicine

## 2019-09-06 NOTE — Telephone Encounter (Signed)
Clarified that patient should hold her Eliquis the day before and the day of the procedure.

## 2019-09-07 ENCOUNTER — Telehealth: Payer: Self-pay

## 2019-09-07 NOTE — Telephone Encounter (Signed)
Covid-19 screening questions   Do you now or have you had a fever in the last 14 days? NO   Do you have any respiratory symptoms of shortness of breath or cough now or in the last 14 days? NO  Do you have any family members or close contacts with diagnosed or suspected Covid-19 in the past 14 days? NO  Have you been tested for Covid-19 and found to be positive? NO        

## 2019-09-08 ENCOUNTER — Encounter: Payer: Self-pay | Admitting: Internal Medicine

## 2019-09-08 ENCOUNTER — Other Ambulatory Visit: Payer: Self-pay

## 2019-09-08 ENCOUNTER — Ambulatory Visit (AMBULATORY_SURGERY_CENTER): Payer: Medicare Other | Admitting: Internal Medicine

## 2019-09-08 VITALS — BP 97/79 | HR 120 | Temp 98.1°F | Resp 10 | Ht 60.0 in | Wt 114.0 lb

## 2019-09-08 DIAGNOSIS — K219 Gastro-esophageal reflux disease without esophagitis: Secondary | ICD-10-CM | POA: Diagnosis not present

## 2019-09-08 DIAGNOSIS — R110 Nausea: Secondary | ICD-10-CM | POA: Diagnosis not present

## 2019-09-08 DIAGNOSIS — R197 Diarrhea, unspecified: Secondary | ICD-10-CM

## 2019-09-08 DIAGNOSIS — K5730 Diverticulosis of large intestine without perforation or abscess without bleeding: Secondary | ICD-10-CM

## 2019-09-08 DIAGNOSIS — R1013 Epigastric pain: Secondary | ICD-10-CM | POA: Diagnosis not present

## 2019-09-08 DIAGNOSIS — R194 Change in bowel habit: Secondary | ICD-10-CM

## 2019-09-08 DIAGNOSIS — K648 Other hemorrhoids: Secondary | ICD-10-CM | POA: Diagnosis not present

## 2019-09-08 DIAGNOSIS — K625 Hemorrhage of anus and rectum: Secondary | ICD-10-CM

## 2019-09-08 DIAGNOSIS — Z7901 Long term (current) use of anticoagulants: Secondary | ICD-10-CM

## 2019-09-08 DIAGNOSIS — R195 Other fecal abnormalities: Secondary | ICD-10-CM

## 2019-09-08 MED ORDER — SODIUM CHLORIDE 0.9 % IV SOLN: 500.0000 mL | Freq: Once | INTRAVENOUS | Status: DC

## 2019-09-08 NOTE — Op Note (Signed)
[facility] Patient Name: Susan Fitzgerald Procedure Date: 09/08/2019 11:52 AM MRN: [id number] Endoscopist: Docia Chuck. Henrene Pastor , MD Age: 85 Referring MD:  Date of Birth: [birth year] Gender: Female Account #: [id number] Procedure:                Upper GI endoscopy Indications:              Dyspepsia, nausea Medicines:                Monitored Anesthesia Care Procedure:                Pre-Anesthesia Assessment:                           - Prior to the procedure, a History and Physical                            was performed, and patient medications and                            allergies were reviewed. The patient's tolerance of                            previous anesthesia was also reviewed. The risks                            and benefits of the procedure and the sedation                            options and risks were discussed with the patient.                            All questions were answered, and informed consent                            was obtained. Prior Anticoagulants: The patient has                            taken Eliquis (apixaban), last dose was 2 days                            prior to procedure. ASA Grade Assessment: III - A                            patient with severe systemic disease. After                            reviewing the risks and benefits, the patient was                            deemed in satisfactory condition to undergo the                            procedure.  After obtaining informed consent, the endoscope was                            passed under direct vision. Throughout the                            procedure, the patient's blood pressure, pulse, and                            oxygen saturations were monitored continuously. The                            Endoscope was introduced through the mouth, and                            advanced to the second part of duodenum. The upper      GI endoscopy was accomplished without difficulty.                            The patient tolerated the procedure well. Scope In: Scope Out: Findings:                 The esophagus was normal.                           The stomach was normal save 1 diminutive benign                            fundic gland type polyp in the proximal portion.                           The examined duodenum was normal.                           The cardia and gastric fundus were normal on                            retroflexion. Complications:            No immediate complications. Estimated Blood Loss:     Estimated blood loss: none. Impression:               1. Essentially normal upper endoscopy                           2. Medication related dyspepsia seems likely. Recommendation:           - Patient has a contact number available for                            emergencies. The signs and symptoms of potential                            delayed complications were discussed with the                            patient.  Return to normal activities tomorrow.                            Written discharge instructions were provided to the                            patient.                           - Resume previous diet.                           - Continue present medications. RESUME [place]                           - Return to the care of your primary provider Docia Chuck. Henrene Pastor, MD 09/08/2019 12:24:36 PM This report has been signed electronically.

## 2019-09-08 NOTE — Progress Notes (Signed)
Pt's states no medical or surgical changes since previsit or office visit.  [place]

## 2019-09-08 NOTE — Progress Notes (Signed)
Called to room to assist during endoscopic procedure.  Patient ID and intended procedure confirmed with present staff. Received instructions for my participation in the procedure from the performing physician.  

## 2019-09-08 NOTE — Progress Notes (Signed)
Report to PACU, RN, vss, BBS= Clear.  

## 2019-09-08 NOTE — Op Note (Signed)
[name]: Susan Fitzgerald Procedure Date: 09/08/2019 11:52 AM MRN: [id number] Endoscopist: Docia Chuck. Henrene Pastor , MD Age: 85 Referring MD:  Date of Birth: 07/13/1932 Gender: Female Account #: [id number] Procedure:                Colonoscopy with biopsies Indications:              Rectal bleeding, Diarrhea. Previous colonoscopies                            elsewhere. Last examination November 2011 with Dr.                            Amedeo Plenty (diverticulosis) Medicines:                Monitored Anesthesia Care Procedure:                Pre-Anesthesia Assessment:                           - Prior to the procedure, a History and Physical                            was performed, and patient medications and                            allergies were reviewed. The patient's tolerance of                            previous anesthesia was also reviewed. The risks                            and benefits of the procedure and the sedation                            options and risks were discussed with the patient.                            All questions were answered, and informed consent                            was obtained. Prior Anticoagulants: The patient has                            taken Eliquis (apixaban), last dose was 2 days                            prior to procedure. ASA Grade Assessment: III - A                            patient with severe systemic disease. After                            reviewing the risks and benefits, the patient was  deemed in satisfactory condition to undergo the                            procedure.                           After obtaining informed consent, the colonoscope                            was passed under direct vision. Throughout the                            procedure, the patient's blood pressure, pulse, and                            oxygen saturations were monitored continuously. The                      Colonoscope was introduced through the anus and                            advanced to the the cecum, identified by                            appendiceal orifice and ileocecal valve. The                            ileocecal valve, appendiceal orifice, and rectum                            were photographed. The quality of the bowel                            preparation was excellent. The colonoscopy was                            performed without difficulty. The patient tolerated                            the procedure well. The bowel preparation used was                            SUPREP via split dose instruction. Scope In: 11:57:46 AM Scope Out: 12:09:50 PM Scope Withdrawal Time: 0 hours 7 minutes 29 seconds  Total Procedure Duration: 0 hours 12 minutes 4 seconds  Findings:                 Multiple diverticula were found in the sigmoid                            colon.                           Internal hemorrhoids were found during  retroflexion. The hemorrhoids were moderate and                            inflamed. Complications:            No immediate complications. Estimated blood loss:                            None. Estimated Blood Loss:     Estimated blood loss: none. Impression:               - Diverticulosis in the sigmoid colon.                           - Internal hemorrhoids. This is the cause for                            rectal bleeding                           - No specimens collected. Recommendation:           - Repeat colonoscopy is not recommended for                            surveillance.                           - Patient has a contact number available for                            emergencies. The signs and symptoms of potential                            delayed complications were discussed with the                            patient. Return to normal activities tomorrow.                             Written discharge instructions were provided to the                            patient.                           - Resume previous diet.                           - Continue present medications. RESUME ELIQUIS                            TODAY.                           - Await pathology results.                           - METAMUCIL 2 tablespoons DAILY. This  may help both                            the diarrhea and hemorrhoids [name], MD 09/08/2019 12:15:13 PM This report has been signed electronically.

## 2019-09-08 NOTE — Patient Instructions (Signed)
Thank you for allowing [place] to care for you today!  Resume previous medications today, including Eliquis.   Return to your normal activities tomorrow.  Recommend adding 2 tablespoons of Metamucil daily  to help with diarrhea and hemorrhoids.    YOU HAD AN ENDOSCOPIC PROCEDURE TODAY AT [facility]:   Refer to the procedure report that was given to you for any specific questions about what was found during the examination.  If the procedure report does not answer your questions, please call your gastroenterologist to clarify.  If you requested that your care partner not be given the details of your procedure findings, then the procedure report has been included in a sealed envelope for you to review at your convenience later.  YOU SHOULD EXPECT: Some feelings of bloating in the abdomen. Passage of more gas than usual.  Walking can help get rid of the air that was put into your GI tract during the procedure and reduce the bloating. If you had a lower endoscopy (such as a colonoscopy or flexible sigmoidoscopy) you may notice spotting of blood in your stool or on the toilet paper. If you underwent a bowel prep for your procedure, you may not have a normal bowel movement for a few days.  Please Note:  You might notice some irritation and congestion in your nose or some drainage.  This is from the oxygen used during your procedure.  There is no need for concern and it should clear up in a day or so.  SYMPTOMS TO REPORT IMMEDIATELY:   Following lower endoscopy (colonoscopy or flexible sigmoidoscopy):  Excessive amounts of blood in the stool  Significant tenderness or worsening of abdominal pains  Swelling of the abdomen that is new, acute  Fever of 100F or higher   Following upper endoscopy (EGD)  Vomiting of blood or coffee ground material  New chest pain or pain under the shoulder blades  Painful or persistently difficult swallowing  New shortness of breath  Fever of 100F or  higher  Black, tarry-looking stools  For urgent or emergent issues, a gastroenterologist can be reached at any hour by calling [phone].   DIET:  We do recommend a small meal at first, but then you may proceed to your regular diet.  Drink plenty of fluids but you should avoid alcoholic beverages for 24 hours.  ACTIVITY:  You should plan to take it easy for the rest of today and you should NOT DRIVE or use heavy machinery until tomorrow (because of the sedation medicines used during the test).    FOLLOW UP: Our staff will call the number listed on your records 48-72 hours following your procedure to check on you and address any questions or concerns that you may have regarding the information given to you following your procedure. If we do not reach you, we will leave a message.  We will attempt to reach you two times.  During this call, we will ask if you have developed any symptoms of COVID 19. If you develop any symptoms (ie: fever, flu-like symptoms, shortness of breath, cough etc.) before then, please call [phone].  If you test positive for Covid 19 in the 2 weeks post procedure, please call and report this information to [place].    If any biopsies were taken you will be contacted by phone or by letter within the next 1-3 weeks.  Please call us at [phone] if you have not heard about the biopsies in 3 weeks.  SIGNATURES/CONFIDENTIALITY: You and/or your care partner have signed paperwork which will be entered into your electronic medical record.  These signatures attest to the fact that that the information above on your After Visit Summary has been reviewed and is understood.  Full responsibility of the confidentiality of this discharge information lies with you and/or your care-partner.

## 2019-09-09 ENCOUNTER — Telehealth: Payer: Self-pay | Admitting: Internal Medicine

## 2019-09-09 NOTE — Telephone Encounter (Signed)
Pt left msg with answering service reporting that she has been having a lot of sneezing since her procedure yesterday.

## 2019-09-09 NOTE — Telephone Encounter (Signed)
Spoke with pt and she states that yesterday evening and this am she had a lot of sneezing and that made her nose run some. Discussed with her it was probably some irritation from the oxygen. She reports she is better now.

## 2019-09-10 ENCOUNTER — Telehealth: Payer: Self-pay | Admitting: Pharmacist

## 2019-09-10 ENCOUNTER — Other Ambulatory Visit: Payer: Self-pay

## 2019-09-10 ENCOUNTER — Encounter: Payer: Self-pay | Admitting: Pulmonary Disease

## 2019-09-10 ENCOUNTER — Telehealth: Payer: Self-pay

## 2019-09-10 ENCOUNTER — Ambulatory Visit: Payer: Medicare Other | Admitting: Pulmonary Disease

## 2019-09-10 ENCOUNTER — Encounter: Payer: Self-pay | Admitting: Internal Medicine

## 2019-09-10 VITALS — BP 122/74 | HR 73 | Temp 97.2°F | Ht 60.0 in | Wt 115.0 lb

## 2019-09-10 DIAGNOSIS — J849 Interstitial pulmonary disease, unspecified: Secondary | ICD-10-CM | POA: Diagnosis not present

## 2019-09-10 NOTE — Patient Instructions (Signed)
We will send a comprehensive metabolic panel to reevaluate your liver Given your diarrhea and recent bleeding I agree with stopping the Ofev We will need alternative medication called Esbriet.  We will start the paperwork to get insurance approval Follow-up in 1 to 2 months.

## 2019-09-10 NOTE — Progress Notes (Signed)
Susan Fitzgerald    [id number]    January 01, 1932  Primary Care Physician:Gottschalk, Koleen Distance, DO  Referring Physician: [name], DO [place],  [place] [address]  Chief complaint: Follow-up for RA ILD  HPI: 85 year old with past medical history of atrial fibrillation, rheumatoid arthritis, pulmonary fibrosis.  Referred here for a second opinion on pulmonary fibrosis from Susan Fitzgerald  Had a CT scan in 2019 which showed baseline fibrotic changes.  At that point she was on amiodarone for atrial fibrillation which was held.  She had been on amiodarone from 2017 for about 2 years.  She was treated for CHF on CT scan and prednisone with improvement in symptoms of dyspnea    She has been diagnosed with rheumatoid arthritis around 2017.  She follows with Susan Fitzgerald.  She was initially on methotrexate which was held briefly in 2019 due to elevated creatinine, but restarted around November 2019 for worsening symptoms.  She did not tolerate leflunomide due to significant hair loss.  Recently finished pulmonary rehab with improvement in symptoms of dyspnea.  She is back to golfing However for the last 3 days she is felt increasing palpitations and heart rate.  She has a call pending with cardiology for reassessment of atrial fibrillation.  Extended ILD questionnaire 02/12/2019-no known exposures, no mold, hot tub, Jacuzzi, humidifier, no birds at home. Smoking history: Never smoker Travel history: No significant travel history Relevant family history: No family history of lung disease or pulmonary fibrosis.  Interim history: Susan Fitzgerald is here for review of antifibrotics and management.  She was started on Ofev around April 2020.  Her methotrexate was stopped and she is on azathioprine for rheumatoid arthritis She has had some side effects from Ofev including alternating diarrhea, constipation.  We had reduced the dose to 100 mg twice daily with no relief.   More recently she  has developed lower Fitzgerald bleed.  Underwent EGD, colonoscopy by Dr. Henrene Fitzgerald, Susan Fitzgerald and found to have internal hemorrhoids.  She is now completely off Ofev since August 2020  States that dyspnea on exertion is stable with no new issues  Outpatient Encounter Medications as of 09/10/2019  Medication Sig  . apixaban (ELIQUIS) 2.5 MG TABS tablet Take 1 tablet (2.5 mg total) by mouth 2 (two) times daily.  Marland Kitchen azaTHIOprine (IMURAN) 50 MG tablet Take 50 mg by mouth daily.   . calcium-vitamin D (OSCAL WITH D) 250-125 MG-UNIT per tablet Take 1 tablet by mouth 2 (two) times daily.  . Cyanocobalamin (VITAMIN B-12 IJ) Inject once a month  . diltiazem (CARDIZEM CD) 300 MG 24 hr capsule Take 1 capsule (300 mg total) by mouth daily.  . folic acid (FOLVITE) 1 MG tablet Take 1 mg by mouth daily.   . furosemide (LASIX) 20 MG tablet TAKE 1 TO 2 TABLETS BY  MOUTH DAILY AS NEEDED  . gabapentin (NEURONTIN) 100 MG capsule Take 100 mg by mouth at bedtime.   Marland Kitchen levothyroxine (SYNTHROID, LEVOTHROID) 75 MCG tablet TAKE 1 TABLET BY MOUTH  DAILY  . lisinopril (PRINIVIL,ZESTRIL) 20 MG tablet Take 1 tablet (20 mg total) by mouth daily. (Patient taking differently: Take 20 mg by mouth every evening. )  . lisinopril-hydrochlorothiazide (PRINZIDE,ZESTORETIC) 20-12.5 MG tablet Take 1 tablet by mouth daily. (Patient taking differently: Take 1 tablet by mouth every morning. )  . Melatonin 5 MG TABS Take 5 mg by mouth at bedtime.  . Multiple Vitamin (MULTIVITAMIN) capsule  Take 1 capsule by mouth daily.  . ondansetron (ZOFRAN-ODT) 4 MG disintegrating tablet DISSOLVE 1 TABLET IN MOUTH EVERY 8 HOURS AS NEEDED FOR NAUSEA AND VOMITING  . potassium chloride SA (K-DUR) 20 MEQ tablet TAKE 1 TABLET BY MOUTH  DAILY  . [DISCONTINUED] Nintedanib (OFEV) 100 MG CAPS Take 1 capsule (100 mg total) by mouth 2 (two) times daily. This prescription sent due to the 150 mg BID causing diarrhea. This prescription for 100 mg BID sent at Susan Fitzgerald request.  (Patient not taking: Reported on 08/26/2019)   Facility-Administered Encounter Medications as of 09/10/2019  Medication  . cyanocobalamin ((VITAMIN B-12)) injection 1,000 mcg   Physical Exam: Blood pressure 122/74, pulse 73, temperature (!) 97.2 F (36.2 C), temperature source Temporal, height 5' (1.524 m), weight 115 lb (52.2 kg), SpO2 94 %. Gen:      No acute distress HEENT:  EOMI, sclera anicteric Neck:     No masses; no thyromegaly Lungs:    Basal crackles CV:         Regular rate and rhythm; no murmurs Abd:      + bowel sounds; soft, non-tender; no palpable masses, no distension Ext:    No edema; adequate peripheral perfusion Skin:      Warm and dry; no rash Neuro: alert and oriented x 3 Psych: normal mood and affect  Data Reviewed: Imaging: CT chest 02/09/2017- mild reticulation, interstitial prominence at the bases. CT chest 06/23/2018- groundglass attenuation, interlobular septal thickening, subpleural reticulation mild air trapping. CT chest 01/13/2019- peripheral and basilar pattern of subpleural reticulation, groundglass and traction bronchiectasis.  No honeycombing.  Probable UIP pattern. CT high-resolution 08/31/2019-stable pulmonary fibrosis probable UIP pattern I reviewed the images personally.  PFTs: 01/20/2019 FVC 2.17 [114%), FEV1 1.67 [120%],/F 77, TLC 78%, DLCO 56% Minimal restriction with moderate diffusion impairment.  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  Other considerations include amiodarone toxicity but she has been off amiodarone for some time now.  Methotrexate can cause pulmonary fibrosis in this pattern as well. Would not recommend work-up for IPF such as lung biopsy as it would not change our recommended management. Agree with holding amiodarone given presence of pulmonary fibrosis.  We have tried  Ofev but she is intolerant of therapy and with her Fitzgerald bleed, use of Eliquis we would like to avoid this going forward. Had a discussion with patient today about alternatives including monitoring without therapy or trying Esbriet.  Esbriet has not been studied as much with rheumatoid arthritis but likely has the same mechanism of action.    She is agreeable to trying Esbriet.  Will place an order for those and ask pharmacy to help with insurance approval. Check comprehensive metabolic panel to monitor liver  Plan/Recommendations: - Start paperwork for esbriet anti-fibrotic therapy - Comprehensive metabolic panel.  Marshell Garfinkel MD [facility] 09/10/2019, 12:15 PM  CC: Susan Norlander, DO

## 2019-09-10 NOTE — Telephone Encounter (Signed)
  Follow up Call-  Call back number 09/08/2019  Post procedure Call Back phone  # [phone] or [phone]  Permission to leave phone message Yes  Some recent data might be hidden     Patient questions:  Do you have a fever, pain , or abdominal swelling? No. Pain Score  0 *  Have you tolerated food without any problems? Yes.    Have you been able to return to your normal activities? Yes.    Do you have any questions about your discharge instructions: Diet   No. Medications  No. Follow up visit  No.  Do you have questions or concerns about your Care? No.  Actions: * If pain score is 4 or above: No action needed, pain <4.  1. Have you developed a fever since your procedure? no  2.   Have you had an respiratory symptoms (SOB or cough) since your procedure? no  3.   Have you tested positive for COVID 19 since your procedure no  4.   Have you had any family members/close contacts diagnosed with the COVID 19 since your procedure?  no   If yes to any of these questions please route to Joylene John, RN and Alphonsa Gin, Therapist, sports.

## 2019-09-10 NOTE — Telephone Encounter (Signed)
Received paperwork for [employer].  Will start working on coverage and update when we hear a response.   Mariella Saa, PharmD, [place], [place] Clinical Specialty Pharmacist [phone]  09/10/2019 3:42 PM

## 2019-09-10 NOTE — Telephone Encounter (Signed)
-----   Message from Marshell Garfinkel, MD sent at 09/10/2019  3:03 PM EDT ----- Trying her on esbriet. Rosell Khouri- can you help with approvals? thanks

## 2019-09-13 ENCOUNTER — Other Ambulatory Visit: Payer: Medicare Other

## 2019-09-13 DIAGNOSIS — J849 Interstitial pulmonary disease, unspecified: Secondary | ICD-10-CM | POA: Diagnosis not present

## 2019-09-13 NOTE — Telephone Encounter (Signed)
Submitted a Prior Authorization request to [facility] for [place] via Cover My Meds. Will update once we receive a response.    PA Case ID: [id number]

## 2019-09-13 NOTE — Telephone Encounter (Signed)
Received email from patient about December appointment being an error.  Responding to [place] email letting her know that her doctor does want to see her in that time frame.  If she needs to reschedule her appointment gave her the office number to call.

## 2019-09-13 NOTE — Addendum Note (Signed)
Addended by: Earlene Plater on: 09/13/2019 11:49 AM   Modules accepted: Orders

## 2019-09-14 ENCOUNTER — Telehealth: Payer: Self-pay | Admitting: Pulmonary Disease

## 2019-09-14 DIAGNOSIS — J849 Interstitial pulmonary disease, unspecified: Secondary | ICD-10-CM

## 2019-09-14 LAB — COMPREHENSIVE METABOLIC PANEL
ALT: 11 IU/L (ref 0–32)
AST: 24 IU/L (ref 0–40)
Albumin/Globulin Ratio: 1.6 (ref 1.2–2.2)
Albumin: 3.9 g/dL (ref 3.6–4.6)
Alkaline Phosphatase: 81 IU/L (ref 39–117)
BUN/Creatinine Ratio: 19 (ref 12–28)
BUN: 21 mg/dL (ref 8–27)
Bilirubin Total: 0.4 mg/dL (ref 0.0–1.2)
CO2: 26 mmol/L (ref 20–29)
Calcium: 9.3 mg/dL (ref 8.7–10.3)
Chloride: 101 mmol/L (ref 96–106)
Creatinine, Ser: 1.1 mg/dL — ABNORMAL HIGH (ref 0.57–1.00)
GFR calc Af Amer: 53 mL/min/{1.73_m2} — ABNORMAL LOW (ref >59)
GFR calc non Af Amer: 46 mL/min/{1.73_m2} — ABNORMAL LOW (ref >59)
Globulin, Total: 2.5 g/dL (ref 1.5–4.5)
Glucose: 81 mg/dL (ref 65–99)
Potassium: 4.5 mmol/L (ref 3.5–5.2)
Sodium: 141 mmol/L (ref 134–144)
Total Protein: 6.4 g/dL (ref 6.0–8.5)

## 2019-09-14 NOTE — Telephone Encounter (Signed)
Called and spoke to pt. Pt states at her last OV with Dr. [name] Browner it was discussed about a repeat PFT. Although, I didn't see it mentioned in the last OV note from 09/10/2019.   Dr. [name] Browner please advise if pt needs a PFT. Thanks.

## 2019-09-15 NOTE — Telephone Encounter (Signed)
Yes. Please order spirometry and diffusion capacity. Thanks Can be done on day of return visit in December.

## 2019-09-15 NOTE — Telephone Encounter (Signed)
Order placed for PFT.  Please schedule PFT for next available. Thank you!

## 2019-09-15 NOTE — Telephone Encounter (Signed)
Received a fax regarding Prior Authorization from [facility] for [place]. Authorization has been DENIED because Exclusion of other known causes of ILD.  Pharmacy team will submit appeal.  PA# [id number] Phone# [phone]  8:31 AM Beatriz Chancellor, CPhT

## 2019-09-16 ENCOUNTER — Other Ambulatory Visit: Payer: Self-pay

## 2019-09-17 ENCOUNTER — Ambulatory Visit (INDEPENDENT_AMBULATORY_CARE_PROVIDER_SITE_OTHER): Payer: Medicare Other

## 2019-09-17 ENCOUNTER — Encounter: Payer: Self-pay | Admitting: Pharmacist

## 2019-09-17 DIAGNOSIS — L659 Nonscarring hair loss, unspecified: Secondary | ICD-10-CM

## 2019-09-17 DIAGNOSIS — E538 Deficiency of other specified B group vitamins: Secondary | ICD-10-CM

## 2019-09-17 NOTE — Progress Notes (Signed)
Cyanocobalamin injection given to left deltoid.  Patient tolerated well. 

## 2019-09-17 NOTE — Progress Notes (Signed)
Appeal Letter for [employer].

## 2019-09-21 ENCOUNTER — Telehealth: Payer: Self-pay | Admitting: Pulmonary Disease

## 2019-09-21 NOTE — Telephone Encounter (Signed)
Spoke with pt, she is requesting an update on Esbriet. I advised her that we had to send in an appeal letter and that we were waiting on a response from her insurance company. Can we please call pt when we have a response. Thanks

## 2019-09-23 NOTE — Telephone Encounter (Signed)
Patient scheduled with Dr. [name] Browner on 11/03/2019 - no pft available prior - is it okay to wait until January and have pt follow up with an APP - or does this need to be scheduled at the hospital -pr

## 2019-09-24 NOTE — Telephone Encounter (Signed)
Can be done in [place]

## 2019-09-24 NOTE — Telephone Encounter (Signed)
Forwarding back to front office staff as Juluis Rainier.

## 2019-09-27 NOTE — Telephone Encounter (Signed)
Patient scheduled for pft on 11/26/2019-pr

## 2019-09-28 NOTE — Telephone Encounter (Signed)
Amber, please advise if you have an update for pt. Thanks!

## 2019-09-29 NOTE — Telephone Encounter (Signed)
Printed & placed in folder to be scheduled.

## 2019-10-01 NOTE — Telephone Encounter (Signed)
I got the letter that Susan Fitzgerald was approved after appeal. Authorization # [id number]  Will CC our pharmacists so that she can get the medication delivered.  Marshell Garfinkel MD [facility] 10/01/2019, 8:46 AM

## 2019-10-01 NOTE — Telephone Encounter (Signed)
Ran test claim for 1 month of Esbriet, patient's copay is $398.56. Patient can apply for an IPF grant or [employer] PAP.  1:44 PM Beatriz Chancellor, CPhT

## 2019-10-01 NOTE — Telephone Encounter (Signed)
Submitted Patient Assistance Application to [employer] for [employer] along with provider portion, PA and income documents. Will follow status and will update patient when we receive a response.  Will send documents to scan center.  Fax# [phone] Phone# [phone]  3:22 PM Beatriz Chancellor, CPhT

## 2019-10-01 NOTE — Telephone Encounter (Signed)
Called patient and updated on status of Esbriet referral. Will follow up once we receive response from [place].  3:54 PM Susan Fitzgerald, CPhT

## 2019-10-06 ENCOUNTER — Other Ambulatory Visit: Payer: Self-pay

## 2019-10-06 ENCOUNTER — Ambulatory Visit (INDEPENDENT_AMBULATORY_CARE_PROVIDER_SITE_OTHER): Payer: Medicare Other | Admitting: Family Medicine

## 2019-10-06 DIAGNOSIS — Z98811 Dental restoration status: Secondary | ICD-10-CM

## 2019-10-06 DIAGNOSIS — Z96641 Presence of right artificial hip joint: Secondary | ICD-10-CM | POA: Diagnosis not present

## 2019-10-06 DIAGNOSIS — Z20822 Contact with and (suspected) exposure to covid-19: Secondary | ICD-10-CM

## 2019-10-06 MED ORDER — AMOXICILLIN 500 MG PO CAPS: 2000.0000 mg | ORAL_CAPSULE | Freq: Once | ORAL | 0 refills | Status: AC

## 2019-10-06 MED ORDER — AMOXICILLIN 500 MG PO CAPS: 2000.0000 mg | ORAL_CAPSULE | Freq: Once | ORAL | 0 refills | Status: DC

## 2019-10-06 NOTE — Progress Notes (Signed)
Telephone visit  Subjective: CC: dental appointment PCP: [name], DO OEV:OJJKK Susan Fitzgerald is a 85 y.o. female calls for telephone consult today. Patient provides verbal consent for consult held via phone.  Location of patient: home Location of provider: Working remotely from home Others present for call: spouse  1.  Upcoming dental procedure Patient reports she has an upcoming dental procedure on Monday where she will be having about an hour duration of dental work on a crown.  She has a history of right hip replacement and is immunosuppressed.  Previously, they have prescribed her amoxicillin prior to the appointment and she is calling for this today.  She goes on to report that her husband has been having some symptoms concerning for COVID-19 and she is currently undergoing testing.  She had her test performed today in [place].  Though she does report she is not symptomatic.   ROS: Per HPI  Allergies  Allergen Reactions  . Benazepril Hcl Cough   Past Medical History:  Diagnosis Date  . Arthritis   . Bradycardia 2014  . Calcification of bronchial airway 06/23/2018   01/2017 - See on CT imaging of the chest   . Cataract   . Cerebral vascular disease 06/19/2018   06/19/2018 - MRI with chronic microvascular ischemic change  . Diverticulosis   . Dyspnea    periodically  . Dysrhythmia    a - fib  . Heart murmur    ??? bruit  . History of stress test    a. 04/09/13-nuclear stress-no ischemia low risk  . Hypertension   . Menopause   . Osteoporosis   . PAF (paroxysmal atrial fibrillation) ([place])    a. s/p prior DCCV; b. On flecainide & coumadin (CHA2DS2VASc = 4);  c. 03/2016 Echo: EF 55-60%, mod LVH, mod AI, mild to mod TR, PASP 56mmHg.  Marland Kitchen Pelvic fracture ([place])   . Pulmonary fibrosis ([place]) 2018  . Rheumatoid arthritis ([place]) 2017    Current Outpatient Medications:  .  apixaban (ELIQUIS) 2.5 MG TABS tablet, Take 1 tablet (2.5 mg total) by mouth 2 (two) times daily.,  Disp: 180 tablet, Rfl: 3 .  azaTHIOprine (IMURAN) 50 MG tablet, Take 50 mg by mouth daily. , Disp: , Rfl:  .  calcium-vitamin D (OSCAL WITH D) 250-125 MG-UNIT per tablet, Take 1 tablet by mouth 2 (two) times daily., Disp: , Rfl:  .  Cyanocobalamin (VITAMIN B-12 IJ), Inject once a month, Disp: , Rfl:  .  diltiazem (CARDIZEM CD) 300 MG 24 hr capsule, Take 1 capsule (300 mg total) by mouth daily., Disp: 90 capsule, Rfl: 3 .  folic acid (FOLVITE) 1 MG tablet, Take 1 mg by mouth daily. , Disp: , Rfl:  .  furosemide (LASIX) 20 MG tablet, TAKE 1 TO 2 TABLETS BY  MOUTH DAILY AS NEEDED, Disp: 180 tablet, Rfl: 1 .  gabapentin (NEURONTIN) 100 MG capsule, Take 100 mg by mouth at bedtime. , Disp: , Rfl:  .  levothyroxine (SYNTHROID, LEVOTHROID) 75 MCG tablet, TAKE 1 TABLET BY MOUTH  DAILY, Disp: 90 tablet, Rfl: 3 .  lisinopril (PRINIVIL,ZESTRIL) 20 MG tablet, Take 1 tablet (20 mg total) by mouth daily. (Patient taking differently: Take 20 mg by mouth every evening. ), Disp: 90 tablet, Rfl: 3 .  lisinopril-hydrochlorothiazide (PRINZIDE,ZESTORETIC) 20-12.5 MG tablet, Take 1 tablet by mouth daily. (Patient taking differently: Take 1 tablet by mouth every morning. ), Disp: 90 tablet, Rfl: 3 .  Melatonin 5 MG TABS, Take 5 mg by  mouth at bedtime., Disp: , Rfl:  .  Multiple Vitamin (MULTIVITAMIN) capsule, Take 1 capsule by mouth daily., Disp: , Rfl:  .  ondansetron (ZOFRAN-ODT) 4 MG disintegrating tablet, DISSOLVE 1 TABLET IN MOUTH EVERY 8 HOURS AS NEEDED FOR NAUSEA AND VOMITING, Disp: 30 tablet, Rfl: 0 .  potassium chloride SA (K-DUR) 20 MEQ tablet, TAKE 1 TABLET BY MOUTH  DAILY, Disp: 90 tablet, Rfl: 0  Current Facility-Administered Medications:  .  cyanocobalamin ((VITAMIN B-12)) injection 1,000 mcg, 1,000 mcg, Intramuscular, Q30 days, Chipper Herb, MD, 1,000 mcg at 09/17/19 1049  Assessment/ Plan: 85 y.o. female   1. History of hip replacement, total, right Given immunosuppression, will prophylax the  patient prior to her dental procedure with 2 g of amoxicillin 1 hour prior to dental procedure.  She is currently awaiting COVID-19 testing results.  Though she remains asymptomatic - amoxicillin (AMOXIL) 500 MG capsule; Take 4 capsules (2,000 mg total) by mouth once for 1 dose. 1 hour before dental procedure.  Dispense: 20 capsule; Refill: 0  2. Dental crown present Plan for dental crown removal - amoxicillin (AMOXIL) 500 MG capsule; Take 4 capsules (2,000 mg total) by mouth once for 1 dose. 1 hour before dental procedure.  Dispense: 20 capsule; Refill: 0   Start time: 1:50pm End time: 2:02pm  Total time spent on patient care (including telephone call/ virtual visit): 15 minutes  [place], [place] [phone]

## 2019-10-06 NOTE — Telephone Encounter (Signed)
Called [employer] to follow up on patient's application. Rep Jeneen Rinks advised they are going to reach out to the patient to discuss assistance. They have left the patient 1 message. Will follow up on Friday.  Phone# [phone]  10:56 AM Beatriz Chancellor, CPhT

## 2019-10-07 ENCOUNTER — Telehealth: Payer: Self-pay | Admitting: Pulmonary Disease

## 2019-10-07 NOTE — Telephone Encounter (Signed)
Pt returned call

## 2019-10-07 NOTE — Telephone Encounter (Signed)
It is ok to take Vitamin C and Zinc with her meds  I am not sure what airborne means on the last message.

## 2019-10-07 NOTE — Telephone Encounter (Signed)
Spoke with patient. She is aware of Dr. Matilde Bash recommendations.   She also wanted to let Dr. [name] Browner know that she and her husband were tested for COVID due to their symptoms.   Nothing further needed at time of call.

## 2019-10-07 NOTE — Telephone Encounter (Signed)
LMTCB

## 2019-10-07 NOTE — Telephone Encounter (Signed)
Spoke with pt, she wants to know if it is ok to take Airborne with the medications she is on. She wanted to take this to help with her immune system during this time.   She went to have a covid test done because her husband has some symptoms and lost his sense of smell, hacking cough. And a low grade fever of 100.0 degrees. He was taking Tylenol for his fever and its going to take 2-3 days before their test comes back. She states she feels ok but with some fatigue. She just wanted to let Dr. [name] Browner know what was going on.

## 2019-10-07 NOTE — Telephone Encounter (Signed)
pt is wanting to know if she shoudl be taking a multi vitamin with Vitamin C and Zinc - suggested to her - wants to make sure that it will be ok with all the meds she takes  She also wants [place] to know that she was having a low grade fever and hacking cough// COVID test was yesterday and 2-3 days for results - he was having some of the same symptoms CB# [phone]

## 2019-10-08 ENCOUNTER — Telehealth: Payer: Self-pay | Admitting: Pulmonary Disease

## 2019-10-08 LAB — NOVEL CORONAVIRUS, NAA: SARS-CoV-2, NAA: DETECTED — AB

## 2019-10-08 NOTE — Telephone Encounter (Signed)
I spoke with patient. She is doing ok with no symptoms at present Advised her to isolate at home, rest, stay well-hydrated She will call back if her symptoms worsen in which case she may need to get admitted to [facility]  Can schedule tele visit next week with APP as Dr. Valeta Harms and I don't have opening in clinic.  Marshell Garfinkel MD [facility] 10/08/2019, 5:21 PM

## 2019-10-08 NOTE — Telephone Encounter (Signed)
Spoke with pt, she and her husband tested positive for Covid 19. She states she feels ok and doesn't feel really sick. She is checking her oxygen and her temperature and everything has been normal. She just wanted to let Dr. [name] Browner know. Does she need to schedule a televisit for follow up? Please advise if you have any instructions for pt.

## 2019-10-08 NOTE — Telephone Encounter (Signed)
I called pt to make an appt. She is scheduled to see SG 10/18/2019 at 10:00am. Nothing further is needed.

## 2019-10-08 NOTE — Telephone Encounter (Signed)
PCCM: I called just to check in on her. She and husband doing well resting at home.  Thankful for the call.  Garner Nash, DO [facility] 10/08/2019 5:55 PM

## 2019-10-09 ENCOUNTER — Other Ambulatory Visit: Payer: Self-pay | Admitting: Family Medicine

## 2019-10-11 NOTE — Telephone Encounter (Signed)
Called [employer] to follow up on patient's status, patient was mailed an updated consent form as the form she signed with last year's form. They were able to speak to patient and patient will sign and return form.  Phone# [phone]  2:08 PM Beatriz Chancellor, CPhT

## 2019-10-18 ENCOUNTER — Ambulatory Visit (INDEPENDENT_AMBULATORY_CARE_PROVIDER_SITE_OTHER): Payer: Medicare Other | Admitting: Acute Care

## 2019-10-18 ENCOUNTER — Encounter: Payer: Self-pay | Admitting: Acute Care

## 2019-10-18 ENCOUNTER — Other Ambulatory Visit: Payer: Self-pay

## 2019-10-18 VITALS — HR 62

## 2019-10-18 DIAGNOSIS — U071 COVID-19: Secondary | ICD-10-CM

## 2019-10-18 DIAGNOSIS — J849 Interstitial pulmonary disease, unspecified: Secondary | ICD-10-CM

## 2019-10-18 NOTE — Patient Instructions (Signed)
It was nice speaking with you today. I am so glad you are continuing to do ok. Continue monitoring your oxygen saturation levels and call if you have drops to 88% that do not resolve quickly Continue to hydrate well, rest , and check your temperature at intervals Slowly increase your activity. Do not over do, but we do want you getting up and moving about as you can, but pace yourself Gravity is the lungs best friend.  Rexene Edison, NP will call you Friday 12/4 to check on you again, but call sooner if you have any changes . You have a follow up appointment with Dr. [name] Browner 12/16 Please contact office for sooner follow up if symptoms do not improve or worsen or seek emergency care

## 2019-10-18 NOTE — Progress Notes (Signed)
PCCM: Thanks for calling her Garner Nash, DO [facility] 10/18/2019 11:28 AM

## 2019-10-18 NOTE — Progress Notes (Signed)
Virtual Visit via Video Note  I connected with Susan Fitzgerald on 10/18/19 at 10:00 AM EST by a video enabled telemedicine application and verified that I am speaking with the correct person using two identifiers.  Location: Patient: At home Provider: In the office at [address] [place], [place], [address]   I discussed the limitations of evaluation and management by telemedicine and the availability of in person appointments. The patient expressed understanding and agreed to proceed.   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  C/o SOB amiodarone 2017 through 2019.  Brief time of stopping MTX due to elevated serum creatinine.  OV 07/08/2018 -HRCT, ?  Edema or GGO.  On oxygen walking with walker.  Started prednisone for possible ILD exacerbation versus drug-induced ILD    History of Present Illness: Pt. Presents for follow up. She and her husband both tested positive for COVID 10/06/2019. We have been following up with her remotely to make sure she is doing well, and to make sure she does not need to seek care at [facility]. At last check she was not having any symptoms. Temperature and oxygen levels have been normal. She did not feel sick.  She has been isolating at home , resting and staying well hydrated. She has been told to call the office for worsening of symptoms in case she needs to be admitted to [facility].  She presents today for check in. She states she has been having some shortness of breath with exertion. She states she gets short of breath with minimal exertion. She states that sitting she is fine , but she does get short of breath with minimal exertion. She states her oxygen  level are 94-95% at rest on room air. She has been dropping to as low as 91-92%. She is not wearing any oxygen. She is speaking to me in full sentences. She does not sound like she is in any distress. She states she has been told by the health nurses that they do not need to isolate any more, but they are staying at home by choice. She denies any fever, 98.4 this morning. She does not have a cough. She has a mild loss of taste of smell. She has been resting and staying well hydrated.She would like to have a call later this week . She has been advised to sleep on her stomach by a family friend. I told her she can do that if she can tolerate it, but if she does not tolerate it well, I have advised her to sleep as she normally does as she is gradually improving. She denies fever, chest pain, orthopnea.    Observations/Objective: 10/18/2019>>Weight 111 10/18/2019>>Temp 98.4 10/18/2019>>Saturations are 94% 10/06/2019>> COVID positive PFT 01/2019 >> FVC of 2.1 L 114% predicted, FEV1 1.67 L, 120% predicted, ratio 77, TLC 78% and a DLCO 56%  06/2018>>HRCT, ?  Edema or GGO.  On oxygen walking with walker.  Started prednisone for possible ILD exacerbation versus drug-induced ILD 01/20/2019: Repeat HRCT, basilar fibrosis 08/2019 : Repeat HTCT:compatible with interstitial lung favored to reflect either early/mild usual interstitial pneumonia (UIP), or potentially fibrotic phase nonspecific interstitial pneumonia (NSIP) disease  Assessment and Plan: Dyspnea  with exertion Saturation 94% on RA Speaking in full sentences , in NAD Plan Continue monitoring saturations Call the office if they are < 88% and do not quickly rebound Please contact office for sooner follow up if symptoms do not improve or worsen or seek emergency care  Continue to rest, hydrate and slowly increase activity as you can Follow up with Pulmonary Rehab once it re-opens Follow up call 10/22/2019 with Rexene Edison NP at 10 am Follow up OV with  Dr. [name] Browner 11/03/2019    Follow Up Instructions: Follow up Tele Visit with Rexene Edison NP 10/22/2019 Follow up with Dr. [name] Browner 11/03/2019 as is scheduled   I discussed the assessment and treatment plan with the patient. The patient was provided an opportunity to ask questions and all were answered. The patient agreed with the plan and demonstrated an understanding of the instructions.   The patient was advised to call back or seek an in-person evaluation if the symptoms worsen or if the condition fails to improve as anticipated.  I provided 35 minutes minutes of non-face-to-face time during this encounter.   Magdalen Spatz, NP 10/18/2019 11:09 AM

## 2019-10-20 ENCOUNTER — Ambulatory Visit: Payer: Medicare Other | Admitting: Pulmonary Disease

## 2019-10-20 ENCOUNTER — Ambulatory Visit: Payer: Medicare Other

## 2019-10-20 NOTE — Telephone Encounter (Signed)
[facility], that have transferred patient to [facility]. Patient's order is ready to ship for zero copay. But they have been unable to reach patient to schedule. Patient is currently sick with Covid, will follow up.  Pharmacy phone# [phone]  10:25 AM Beatriz Chancellor, CPhT

## 2019-10-22 ENCOUNTER — Ambulatory Visit (INDEPENDENT_AMBULATORY_CARE_PROVIDER_SITE_OTHER): Payer: Medicare Other | Admitting: Adult Health

## 2019-10-22 ENCOUNTER — Other Ambulatory Visit: Payer: Self-pay

## 2019-10-22 ENCOUNTER — Telehealth: Payer: Self-pay | Admitting: Adult Health

## 2019-10-22 ENCOUNTER — Encounter: Payer: Self-pay | Admitting: Adult Health

## 2019-10-22 DIAGNOSIS — J849 Interstitial pulmonary disease, unspecified: Secondary | ICD-10-CM | POA: Diagnosis not present

## 2019-10-22 DIAGNOSIS — U071 COVID-19: Secondary | ICD-10-CM | POA: Diagnosis not present

## 2019-10-22 NOTE — Patient Instructions (Signed)
Fluids and rest Advance activity as tolerated Healthy high-protein diet Begin Esbriet as planned tomorrow 1 capsule 3 times daily for 1 week then 2 capsules 3 times daily for 1 week then 3 capsules 3 times daily.  Follow-up with Dr. [name] Browner in 2 weeks as planned and as needed Please contact office for sooner follow up if symptoms do not improve or worsen or seek emergency care

## 2019-10-22 NOTE — Telephone Encounter (Signed)
It should have come with dosing instructions .   Days 1 to 7: 267 mg 3 times daily (total dose: 801 mg/day) (1 capsule Three times a day  ) x 1 week   Days 8 to 14: 534 mg 3 times daily (total dose: 1,602 mg/day) (2 capsules Three times a day  ) 2nd week   Day 15 and thereafter: 801 mg 3 times daily (total dose: 2,403 mg/day). Maximum dose: 2,403 mg/day. (3 capsules Twice daily  ) 3 rd week .

## 2019-10-22 NOTE — Telephone Encounter (Signed)
Called and spoke with pt who wanted clarification on dosage for the Esbriet medication on how she is supposed to start out taking the med.  Tammy, please advise on this for pt as she cannot remember what you discussed with her during televisit today.

## 2019-10-22 NOTE — Telephone Encounter (Signed)
Called and spoke with pt letting her know the info from TP in regards to how she is to be taking the Esbriet.pt verbalized understanding. Nothing further needed.

## 2019-10-22 NOTE — Progress Notes (Signed)
Virtual Visit via Telephone Note  I connected with SHAYLA HEMING on 10/22/19 at 10:00 AM EST by telephone and verified that I am speaking with the correct person using two identifiers.  Location: Patient: Home Provider: Office    I discussed the limitations, risks, security and privacy concerns of performing an evaluation and management service by telephone and the availability of in person appointments. I also discussed with the patient that there may be a patient responsible charge related to this service. The patient expressed understanding and agreed to proceed.   History of Present Illness: 85 year old female followed for RA-ILD , progressive pulmonary fibrosis from 2018-2020 probable UIP pattern.  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  Has previously been on amiodarone but has been off for sometimes.  Previously intolerant to Ofev. Medical history significant for A. fib, rheumatoid arthritis (diagnosed 2017)  Today's televisit is a follow-up for COVID-19.  Patient says that her and her husband tested positive for COVID-19 on October 06, 2019.  Patient says she initially had some cough low-grade fever and shortness of breath.  However this has improved and she is really considered herself to only have mild symptoms with fatigue.  She is still feeling well.  She denies any fever currently.  No increased cough or congestion.  She is has no loss of taste or smell.  She does get winded with heavy activity.  But this has been at her baseline.  Appetite is at baseline she says her appetite is always a little low.  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  She previously was intolerant to Ofev in the past.  She is starting Psychiatric nurse.   Observations/Objective: Imaging: CT chest 02/09/2017- mild reticulation, interstitial prominence at the bases. CT chest 06/23/2018- groundglass attenuation, interlobular septal thickening, subpleural  reticulation mild air trapping. CT chest 01/13/2019- peripheral and basilar pattern of subpleural reticulation, groundglass and traction bronchiectasis.  No honeycombing.  Probable UIP pattern. CT high-resolution 08/31/2019-stable pulmonary fibrosis probable UIP pattern I reviewed the images personally.  PFTs: 01/20/2019 FVC 2.17 [114%), FEV1 1.67 [120%],/F 77, TLC 78%, DLCO 56% Minimal restriction with moderate diffusion impairment.  Labs: CCP 02/15/2016-24 Rheumatoid factor 02/14/2018-23.2  Assessment and Plan: COVID-19 infection-patient has some mild URI symptoms.  She was diagnosed with positive results on November 18.  She seems to be improving nicely.  With no substantial symptoms currently.  Advised to push fluids, high-protein healthy diet and advance activity as tolerated.  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  Patient was intolerant to Ofev.  She is planning to begin [employer].  Plan  Patient Instructions  Fluids and rest Advance activity as tolerated Healthy high-protein diet Begin Esbriet as planned tomorrow 1 capsule 3 times daily for 1 week then 2 capsules 3 times daily for 1 week then 3 capsules 3 times daily.  Follow-up with Dr. [name] Browner in 2 weeks as planned and as needed Please contact office for sooner follow up if symptoms do not improve or worsen or seek emergency care        Follow Up Instructions: Follow-up in 2 weeks as planned and as needed Please contact office for sooner follow up if symptoms do not improve or worsen or seek emergency care     I discussed the assessment and treatment plan with the patient. The patient was provided an opportunity to ask questions and all were answered. The patient agreed with the plan and demonstrated an understanding of  the instructions.   The patient was advised to call back or seek an in-person evaluation if the symptoms worsen or if the condition fails to improve as  anticipated.  I provided 22  minutes of non-face-to-face time during this encounter.   Rexene Edison, NP

## 2019-10-27 NOTE — Telephone Encounter (Signed)
Appeal approved and prescription shipped from [facility] on 10/20/2019.  Closing encounter.  Mariella Saa, PharmD, [place], [place] [facility] Pharmacist [phone]  10/27/2019 9:24 AM

## 2019-11-01 ENCOUNTER — Other Ambulatory Visit: Payer: Self-pay

## 2019-11-01 ENCOUNTER — Ambulatory Visit (INDEPENDENT_AMBULATORY_CARE_PROVIDER_SITE_OTHER): Payer: Medicare Other | Admitting: *Deleted

## 2019-11-01 VITALS — BP 133/79 | HR 74 | Temp 99.0°F | Ht 60.0 in | Wt 111.0 lb

## 2019-11-01 DIAGNOSIS — Z0000 Encounter for general adult medical examination without abnormal findings: Secondary | ICD-10-CM

## 2019-11-01 NOTE — Progress Notes (Signed)
MEDICARE ANNUAL WELLNESS VISIT  11/01/2019  Telephone Visit Disclaimer This Medicare AWV was conducted by telephone due to national recommendations for restrictions regarding the COVID-19 Pandemic (e.g. social distancing).  I verified, using two identifiers, that I am speaking with Susan Fitzgerald or their authorized healthcare agent. I discussed the limitations, risks, security, and privacy concerns of performing an evaluation and management service by telephone and the potential availability of an in-person appointment in the future. The patient expressed understanding and agreed to proceed.   Subjective:  Susan Fitzgerald is a 85 y.o. female patient of Susan Norlander, DO who had a Medicare Annual Wellness Visit today via telephone. Susan Fitzgerald is Retired and lives with their spouse. she has 1 living child. she reports that she is socially active and does interact with friends/family regularly. she is moderately physically active and enjoys golfing and baking.  Patient Care Team: Susan Norlander, DO as PCP - General (Family Medicine) Minus Breeding, MD as PCP - Cardiology (Cardiology) Druscilla Brownie, MD as Consulting Physician (Dermatology) Hennie Duos, MD as Consulting Physician (Rheumatology) Marlaine Hind, MD as Consulting Physician (Physical Medicine and Rehabilitation)  Advanced Directives 11/01/2019 10/28/2018 10/08/2018 08/20/2017 04/09/2017 02/10/2017 02/09/2017  Does Patient Have a Medical Advance Directive? Yes Yes Yes No No Yes Yes  Type of Paramedic of [place];Out of facility DNR (pink MOST or yellow form) [place];Living will [place] will Living will  Does patient want to make changes to medical advance directive? No - Patient declined No - Patient declined No - Patient declined - - No - Patient declined -  Copy of [place] in Chart? No - copy requested No - copy  requested No - copy requested - - - -  Would patient like information on creating a medical advance directive? - - - No - Patient declined No - Patient declined - -  Pre-existing out of facility DNR order (yellow form or pink MOST form) - - - - - - -    Hospital Utilization Over the Past 12 Months: # of hospitalizations or ER visits: 0 # of surgeries: 0  Review of Systems    Patient reports that her overall health is unchanged compared to last year.  History obtained from chart review and patient  Patient Reported Readings (BP, Pulse, CBG, Weight, etc) 133/79, P 74, T 99 Wt 111  Pain Assessment Pain : No/denies pain     Current Medications & Allergies (verified) Allergies as of 11/01/2019      Reactions   Benazepril Hcl Cough      Medication List       Accurate as of November 01, 2019 11:10 AM. If you have any questions, ask your nurse or doctor.        apixaban 2.5 MG Tabs tablet Commonly known as: Eliquis Take 1 tablet (2.5 mg total) by mouth 2 (two) times daily.   azaTHIOprine 50 MG tablet Commonly known as: IMURAN Take 50 mg by mouth daily.   calcium-vitamin D 250-125 MG-UNIT tablet Commonly known as: OSCAL WITH D Take 1 tablet by mouth 2 (two) times daily.   diltiazem 300 MG 24 hr capsule Commonly known as: CARDIZEM CD Take 1 capsule (300 mg total) by mouth daily.   Esbriet 267 MG Tabs Generic drug: Pirfenidone   folic acid 1 MG tablet Commonly known as: FOLVITE Take 1 mg by mouth daily.   furosemide  20 MG tablet Commonly known as: LASIX TAKE 1 TO 2 TABLETS BY  MOUTH DAILY AS NEEDED   gabapentin 100 MG capsule Commonly known as: NEURONTIN Take 100 mg by mouth at bedtime.   levothyroxine 75 MCG tablet Commonly known as: SYNTHROID TAKE 1 TABLET BY MOUTH  DAILY   lisinopril 20 MG tablet Commonly known as: ZESTRIL Take 1 tablet (20 mg total) by mouth daily. What changed: when to take this   lisinopril-hydrochlorothiazide 20-12.5 MG  tablet Commonly known as: ZESTORETIC Take 1 tablet by mouth daily. What changed: when to take this   Melatonin 5 MG Tabs Take 5 mg by mouth at bedtime.   multivitamin capsule Take 1 capsule by mouth daily.   ondansetron 4 MG disintegrating tablet Commonly known as: ZOFRAN-ODT DISSOLVE 1 TABLET IN MOUTH EVERY 8 HOURS AS NEEDED FOR NAUSEA AND VOMITING   potassium chloride SA 20 MEQ tablet Commonly known as: KLOR-CON TAKE 1 TABLET BY MOUTH  DAILY   VITAMIN B-12 IJ Inject once a month       History (reviewed): Past Medical History:  Diagnosis Date  . Arthritis   . Bradycardia 2014  . Calcification of bronchial airway 06/23/2018   01/2017 - See on CT imaging of the chest   . Cataract   . Cerebral vascular disease 06/19/2018   06/19/2018 - MRI with chronic microvascular ischemic change  . Diverticulosis   . Dyspnea    periodically  . Dysrhythmia    a - fib  . Heart murmur    ??? bruit  . History of stress test    a. 04/09/13-nuclear stress-no ischemia low risk  . Hypertension   . Menopause   . Osteoporosis   . PAF (paroxysmal atrial fibrillation) ([place])    a. s/p prior DCCV; b. On flecainide & coumadin (CHA2DS2VASc = 4);  c. 03/2016 Echo: EF 55-60%, mod LVH, mod AI, mild to mod TR, PASP 25mmHg.  Marland Kitchen Pelvic fracture ([place])   . Pulmonary fibrosis ([place]) 2018  . Rheumatoid arthritis ([place]) 2017   Past Surgical History:  Procedure Laterality Date  . BIOPSY BREAST    . BIOPSY BREAST     right & benign  . BREAST SURGERY    . CARDIOVERSION N/A 10/30/2016   Procedure: CARDIOVERSION;  Surgeon: Larey Dresser, MD;  Location: [place];  Service: Cardiovascular;  Laterality: N/A;  . CARDIOVERSION N/A 08/20/2017   Procedure: CARDIOVERSION;  Surgeon: Jerline Pain, MD;  [facility] ENDOSCOPY;  Service: Cardiovascular;  Laterality: N/A;  . CATARACT EXTRACTION    . COLONOSCOPY    . EYE SURGERY     cataracts  . TEE WITHOUT CARDIOVERSION N/A 10/30/2016   Procedure: TRANSESOPHAGEAL  ECHOCARDIOGRAM (TEE);  Surgeon: Larey Dresser, MD;  Location: [place];  Service: Cardiovascular;  Laterality: N/A;  . TOTAL HIP ARTHROPLASTY Right 01/27/2017   Procedure: TOTAL HIP ARTHROPLASTY ANTERIOR APPROACH;  Surgeon: Rod Can, MD;  Location: [place];  Service: Orthopedics;  Laterality: Right;   Family History  Problem Relation Age of Onset  . Stroke Mother        cerebral hemorrhage  . Heart disease Father        MI  . Heart attack Father   . Vision loss Father   . Heart disease Brother   . Heart attack Brother   . Cancer Maternal Aunt        breast  . Cancer Brother   . Heart disease Brother   . Cancer Daughter  breast  . Colon cancer Neg Hx   . Colon polyps Neg Hx   . Rectal cancer Neg Hx   . Stomach cancer Neg Hx    Social History   Socioeconomic History  . Marital status: Married    Spouse name: Rush Landmark   . Number of children: 2  . Years of education: 78  . Highest education level: Not on file  Occupational History  . Not on file  Tobacco Use  . Smoking status: Never Smoker  . Smokeless tobacco: Never Used  Substance and Sexual Activity  . Alcohol use: No  . Drug use: No  . Sexual activity: Yes  Other Topics Concern  . Not on file  Social History Narrative  . Not on file   Social Determinants of Health   Financial Resource Strain:   . Difficulty of Paying Living Expenses: Not on file  Food Insecurity:   . Worried About Charity fundraiser in the Last Year: Not on file  . Ran Out of Food in the Last Year: Not on file  Transportation Needs:   . Lack of Transportation (Medical): Not on file  . Lack of Transportation (Non-Medical): Not on file  Physical Activity:   . Days of Exercise per Week: Not on file  . Minutes of Exercise per Session: Not on file  Stress:   . Feeling of Stress : Not on file  Social Connections:   . Frequency of Communication with Friends and Family: Not on file  . Frequency of Social Gatherings with Friends and  Family: Not on file  . Attends Religious Services: Not on file  . Active Member of Clubs or Organizations: Not on file  . Attends Archivist Meetings: Not on file  . Marital Status: Not on file    Activities of Daily Living In your present state of health, do you have any difficulty performing the following activities: 11/01/2019  Hearing? N  Vision? N  Difficulty concentrating or making decisions? N  Walking or climbing stairs? N  Dressing or bathing? N  Doing errands, shopping? N  Preparing Food and eating ? N  Using the Toilet? N  In the past six months, have you accidently leaked urine? N  Do you have problems with loss of bowel control? N  Managing your Medications? N  Managing your Finances? N  Housekeeping or managing your Housekeeping? N  Some recent data might be hidden    Patient Education/ Literacy How often do you need to have someone help you when you read instructions, pamphlets, or other written materials from your doctor or pharmacy?: 1 - Never What is the last grade level you completed in school?: 12  Exercise Current Exercise Habits: The patient does not participate in regular exercise at present, Exercise limited by: None identified  Diet Patient reports consuming 2 meals a day and 1 snack(s) a day Patient reports that her primary diet is: Low fat Patient reports that she does have regular access to food.   Depression Screen PHQ 2/9 Scores 11/01/2019 08/09/2019 02/11/2019 01/21/2019 12/09/2018 10/28/2018 10/08/2018  PHQ - 2 Score 0 0 0 0 1 0 0  PHQ- 9 Score - 0 - 2 - - 4  Exception Documentation - - - - - - -     Fall Risk Fall Risk  11/01/2019 08/09/2019 02/11/2019 12/09/2018 10/28/2018  Falls in the past year? 0 0 0 1 1  Number falls in past yr: - - - 0 0  Injury with Fall? - - - 1 0  Comment - - - - -  Risk for fall due to : - - - - History of fall(s)  Risk for fall due to: Comment - - - - -  Follow up - - - - Education provided;Falls  prevention discussed     Objective:  ILEENE ALLIE seemed alert and oriented and she participated appropriately during our telephone visit.  Blood Pressure Weight BMI  BP Readings from Last 3 Encounters:  11/01/19 133/79  09/10/19 122/74  09/08/19 97/79   Wt Readings from Last 3 Encounters:  11/01/19 111 lb (50.3 kg)  09/10/19 115 lb (52.2 kg)  09/08/19 114 lb (51.7 kg)   BMI Readings from Last 1 Encounters:  11/01/19 21.68 kg/m    *Unable to obtain current vital signs, weight, and BMI due to telephone visit type  Hearing/Vision  . Oni did not seem to have difficulty with hearing/understanding during the telephone conversation . Reports that she has had a formal eye exam by an eye care professional within the past year . Reports that she has not had a formal hearing evaluation within the past year *Unable to fully assess hearing and vision during telephone visit type  Cognitive Function: 6CIT Screen 11/01/2019  What Year? 0 points  What month? 0 points  What time? 0 points  Count back from 20 0 points  Months in reverse 0 points  Repeat phrase 2 points  Total Score 2   (Normal:0-7, Significant for Dysfunction: >8)  Normal Cognitive Function Screening: Yes   Immunization & Health Maintenance Record Immunization History  Administered Date(s) Administered  . Fluad Quad(high Dose 65+) 07/20/2019  . Influenza, High Dose Seasonal PF 09/20/2016, 09/19/2017, 08/25/2018  . Influenza,inj,Quad PF,6+ Mos 08/23/2013, 09/05/2014, 08/28/2015  . Pneumococcal Conjugate-13 09/05/2014  . Pneumococcal Polysaccharide-23 10/28/2018  . Td 09/02/2011  . Tdap 09/02/2011  . Zoster Recombinat (Shingrix) 11/13/2018, 03/15/2019    Health Maintenance  Topic Date Due  . MAMMOGRAM  09/30/2019  . DEXA SCAN  09/29/2020  . TETANUS/TDAP  09/01/2021  . INFLUENZA VACCINE  Completed  . PNA vac Low Risk Adult  Completed       Assessment  This is a routine wellness examination for IDELLE REIMANN.  Health Maintenance: Due or Overdue Health Maintenance Due  Topic Date Due  . MAMMOGRAM  09/30/2019    Susan Fitzgerald does not need a referral for Community Assistance: Care Management:   no Social Work:    no Prescription Assistance:  no Nutrition/Diabetes Education:  no   Plan:  Personalized Goals Goals Addressed            This Visit's Progress   . LIFESTYLE - DECREASE FALLS RISK (pt-stated)   On track    Keep active and safe, while being very careful      Personalized Health Maintenance & Screening Recommendations    Lung Cancer Screening Recommended: no (Low Dose CT Chest recommended if Age 33-80 years, 30 pack-year currently smoking OR have quit w/in past 15 years) Hepatitis C Screening recommended: no HIV Screening recommended: no  Advanced Directives: Written information was not prepared per patient's request.  Referrals & Orders No orders of the defined types were placed in this encounter.   Follow-up Plan . Follow-up with Susan Norlander, DO as planned  Pt continues to be very active and alert. She continues to play golf . Her hearing and vision are good. She is up to date  on all health maintenance. She will be having her mammogram 11/02/19. Goals set. I have personally reviewed and noted the following in the patient's chart:   . Medical and social history . Use of alcohol, tobacco or illicit drugs  . Current medications and supplements . Functional ability and status . Nutritional status . Physical activity . Advanced directives . List of other physicians . Hospitalizations, surgeries, and ER visits in previous 12 months . Vitals . Screenings to include cognitive, depression, and falls . Referrals and appointments  In addition, I have reviewed and discussed with Susan Fitzgerald certain preventive protocols, quality metrics, and best practice recommendations. A written personalized care plan for preventive services as well as general  preventive health recommendations is available and can be mailed to the patient at her request.      Faylene Million Charmaine,LPN  [phone]

## 2019-11-02 ENCOUNTER — Ambulatory Visit (INDEPENDENT_AMBULATORY_CARE_PROVIDER_SITE_OTHER): Payer: Medicare Other

## 2019-11-02 DIAGNOSIS — L659 Nonscarring hair loss, unspecified: Secondary | ICD-10-CM | POA: Diagnosis not present

## 2019-11-02 DIAGNOSIS — E538 Deficiency of other specified B group vitamins: Secondary | ICD-10-CM

## 2019-11-02 DIAGNOSIS — Z1231 Encounter for screening mammogram for malignant neoplasm of breast: Secondary | ICD-10-CM | POA: Diagnosis not present

## 2019-11-02 NOTE — Progress Notes (Signed)
Cyanocobalamin injection given to right deltoid.  Patient tolerated well. 

## 2019-11-03 ENCOUNTER — Encounter: Payer: Self-pay | Admitting: Pulmonary Disease

## 2019-11-03 ENCOUNTER — Telehealth (INDEPENDENT_AMBULATORY_CARE_PROVIDER_SITE_OTHER): Payer: Medicare Other | Admitting: Pulmonary Disease

## 2019-11-03 DIAGNOSIS — M0579 Rheumatoid arthritis with rheumatoid factor of multiple sites without organ or systems involvement: Secondary | ICD-10-CM

## 2019-11-03 DIAGNOSIS — U071 COVID-19: Secondary | ICD-10-CM

## 2019-11-03 DIAGNOSIS — J849 Interstitial pulmonary disease, unspecified: Secondary | ICD-10-CM | POA: Diagnosis not present

## 2019-11-03 DIAGNOSIS — Z5181 Encounter for therapeutic drug level monitoring: Secondary | ICD-10-CM | POA: Diagnosis not present

## 2019-11-03 NOTE — Telephone Encounter (Signed)
Pt received medication

## 2019-11-03 NOTE — Patient Instructions (Signed)
Glad you are doing well with regard to your breathing Continue taking the Esbriet as ordered.  Increase to 3 tablets 3 times a day this week Use Zofran as needed for nausea We will refer you to pharmacy to review your medications We will call in labs including comprehensive metabolic panel, CBC to be done at your primary care at [facility] family practice Follow-up in 4 weeks

## 2019-11-03 NOTE — Progress Notes (Signed)
Virtual Visit via Video Note  I connected with Susan Fitzgerald on 11/03/19 at 10:00 AM EST by a video enabled telemedicine application and verified that I am speaking with the correct person using two identifiers.  Location: Patient: Home Provider: Pulmonary office, [place] discussed the limitations of evaluation and management by telemedicine and the availability of in person appointments. The patient expressed understanding and agreed to proceed.  History of Present Illness: Follow-up for RA ILD, pulmonary fibrosis, recent COVID-81 infection  85 year old with past medical history of atrial fibrillation, rheumatoid arthritis, pulmonary fibrosis. 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  This is probably poorly tolerated due to diarrhea, constipation and lower GI bleed Antifibrotic's change to Esbriet in December 2020   Observations/Objective: Continues on Esbriet.  Currently at 2 tablets 3 times a day with plans to increase to 3 tablets 3 times a day later this week She is tolerating this well with minimal GI symptoms  She and her husband were diagnosed with COVID-19 on November 18.  She has self isolated and is doing well now with no symptoms of dyspnea She is excited because she went back to the golf course and completed 18 holes without issue  Assessment and Plan: RA ILD Continue Esbriet.  Increase to full dose of 3 tablets 3 times daily Zofran as needed for nausea Check CMP, CBC for monitoring  Follow Up Instructions: Follow-up in 4-week   I discussed the assessment and treatment plan with the patient. The patient was provided an opportunity to ask questions and all were answered. The patient agreed with the plan and demonstrated an understanding of the instructions.   The patient was advised to call back or seek an in-person evaluation if the symptoms worsen or if the condition fails to improve as anticipated.  I  provided 25 minutes of non-face-to-face time during this encounter.   Marshell Garfinkel, MD

## 2019-11-08 ENCOUNTER — Other Ambulatory Visit: Payer: Self-pay | Admitting: Cardiology

## 2019-11-15 ENCOUNTER — Telehealth: Payer: Self-pay | Admitting: Pulmonary Disease

## 2019-11-15 NOTE — Telephone Encounter (Signed)
Dr. [name] Browner, please advise if you are okay prescribing something for pt to take prior to her PFT on 2/26.

## 2019-11-18 NOTE — Telephone Encounter (Signed)
Prescribe xanax 0.25 mg once to be taken 1/2 hr before procedure

## 2019-11-22 ENCOUNTER — Other Ambulatory Visit: Payer: Medicare Other

## 2019-11-22 ENCOUNTER — Other Ambulatory Visit: Payer: Self-pay

## 2019-11-22 ENCOUNTER — Telehealth: Payer: Self-pay | Admitting: Pulmonary Disease

## 2019-11-22 DIAGNOSIS — J849 Interstitial pulmonary disease, unspecified: Secondary | ICD-10-CM | POA: Diagnosis not present

## 2019-11-22 LAB — CBC WITH DIFFERENTIAL/PLATELET
Basophils Absolute: 0.1 10*3/uL (ref 0.0–0.2)
Basos: 1 %
EOS (ABSOLUTE): 0.1 10*3/uL (ref 0.0–0.4)
Eos: 2 %
Hematocrit: 36.6 % (ref 34.0–46.6)
Hemoglobin: 12.4 g/dL (ref 11.1–15.9)
Immature Grans (Abs): 0 10*3/uL (ref 0.0–0.1)
Immature Granulocytes: 0 %
Lymphocytes Absolute: 1.4 10*3/uL (ref 0.7–3.1)
Lymphs: 27 %
MCH: 33.7 pg — ABNORMAL HIGH (ref 26.6–33.0)
MCHC: 33.9 g/dL (ref 31.5–35.7)
MCV: 100 fL — ABNORMAL HIGH (ref 79–97)
Monocytes Absolute: 0.6 10*3/uL (ref 0.1–0.9)
Monocytes: 12 %
Neutrophils Absolute: 3 10*3/uL (ref 1.4–7.0)
Neutrophils: 58 %
Platelets: 155 10*3/uL (ref 150–450)
RBC: 3.68 x10E6/uL — ABNORMAL LOW (ref 3.77–5.28)
RDW: 12.7 % (ref 11.7–15.4)
WBC: 5.1 10*3/uL (ref 3.4–10.8)

## 2019-11-22 MED ORDER — ALPRAZOLAM 0.25 MG PO TABS: ORAL_TABLET | ORAL | 0 refills | Status: DC

## 2019-11-22 NOTE — Telephone Encounter (Signed)
Called pt's pharmacy and spoke with pharmacist Stew providing him a verbal Rx for pt's xanax. Called pt but was unable to reach. Left pt a detailed message letting her know this had been done and that the Rx was for her to take 1/2 hour prior to PFT 2/26. Nothing further needed.

## 2019-11-22 NOTE — Addendum Note (Signed)
Addended by: Liliane Bade on: 11/22/2019 10:53 AM   Modules accepted: Orders

## 2019-11-22 NOTE — Telephone Encounter (Signed)
Spoke with patient. She had several things to discuss. She received a letter from the [facility] advising her that she since she has not used her grant in 100+ days, she will lose her grant on 11/26/19. She wanted to make sure this was not related to her Esbriet. Reviewed patient's chart and explained to her it looks like the [facility] was used for her Ofev, which she is no longer taking. She verbalized understanding.   She also wanted to know if she should receive the COVID vaccine. She and her husband tested positive on 10/06/19 and have fully recovered. Advised her that I was not sure and would send a message to Dr. [name] Browner. She verbalized understanding.   Lastly, she wanted to let us know that she went to her PCP today to have her labs done. Advised her that I would Dr. [name] Browner know. The labs are showing as pending in her lab.   Dr. [name] Browner, please advise on the COVID vaccine. Thanks!

## 2019-11-23 LAB — COMPREHENSIVE METABOLIC PANEL
ALT: 8 IU/L (ref 0–32)
AST: 21 IU/L (ref 0–40)
Albumin/Globulin Ratio: 1.4 (ref 1.2–2.2)
Albumin: 3.6 g/dL (ref 3.6–4.6)
Alkaline Phosphatase: 77 IU/L (ref 39–117)
BUN/Creatinine Ratio: 22 (ref 12–28)
BUN: 25 mg/dL (ref 8–27)
Bilirubin Total: 0.3 mg/dL (ref 0.0–1.2)
CO2: 27 mmol/L (ref 20–29)
Calcium: 9.3 mg/dL (ref 8.7–10.3)
Chloride: 101 mmol/L (ref 96–106)
Creatinine, Ser: 1.14 mg/dL — ABNORMAL HIGH (ref 0.57–1.00)
GFR calc Af Amer: 51 mL/min/{1.73_m2} — ABNORMAL LOW (ref >59)
GFR calc non Af Amer: 44 mL/min/{1.73_m2} — ABNORMAL LOW (ref >59)
Globulin, Total: 2.5 g/dL (ref 1.5–4.5)
Glucose: 60 mg/dL — ABNORMAL LOW (ref 65–99)
Potassium: 3.8 mmol/L (ref 3.5–5.2)
Sodium: 142 mmol/L (ref 134–144)
Total Protein: 6.1 g/dL (ref 6.0–8.5)

## 2019-11-25 NOTE — Telephone Encounter (Signed)
Please let her know that labs are stable.  I would advise her to get the Covid vaccine when it is available for her as we are not sure how long the immunity from infection lasts.

## 2019-11-25 NOTE — Telephone Encounter (Signed)
Spoke with pt,aware of recs.  Nothing further needed.  

## 2019-11-26 ENCOUNTER — Ambulatory Visit: Payer: Medicare Other | Admitting: Adult Health

## 2019-11-30 ENCOUNTER — Other Ambulatory Visit: Payer: Self-pay

## 2019-12-01 ENCOUNTER — Ambulatory Visit (INDEPENDENT_AMBULATORY_CARE_PROVIDER_SITE_OTHER): Payer: Medicare Other

## 2019-12-01 DIAGNOSIS — E538 Deficiency of other specified B group vitamins: Secondary | ICD-10-CM

## 2019-12-01 DIAGNOSIS — L659 Nonscarring hair loss, unspecified: Secondary | ICD-10-CM | POA: Diagnosis not present

## 2019-12-06 ENCOUNTER — Other Ambulatory Visit: Payer: Self-pay

## 2019-12-06 ENCOUNTER — Encounter (HOSPITAL_COMMUNITY): Payer: Self-pay

## 2019-12-06 ENCOUNTER — Emergency Department (HOSPITAL_COMMUNITY)
Admission: EM | Admit: 2019-12-06 | Discharge: 2019-12-06 | Disposition: A | Discharge status: Home / Self Care | Payer: Medicare Other | Attending: Emergency Medicine | Admitting: Emergency Medicine

## 2019-12-06 DIAGNOSIS — Z79899 Other long term (current) drug therapy: Secondary | ICD-10-CM | POA: Insufficient documentation

## 2019-12-06 DIAGNOSIS — I10 Essential (primary) hypertension: Secondary | ICD-10-CM | POA: Diagnosis not present

## 2019-12-06 DIAGNOSIS — Z96641 Presence of right artificial hip joint: Secondary | ICD-10-CM | POA: Diagnosis not present

## 2019-12-06 DIAGNOSIS — Z7901 Long term (current) use of anticoagulants: Secondary | ICD-10-CM | POA: Insufficient documentation

## 2019-12-06 DIAGNOSIS — E039 Hypothyroidism, unspecified: Secondary | ICD-10-CM | POA: Insufficient documentation

## 2019-12-06 DIAGNOSIS — R040 Epistaxis: Secondary | ICD-10-CM | POA: Diagnosis not present

## 2019-12-06 NOTE — ED Provider Notes (Signed)
[facility] DEPT Provider Note   CSN: [id number] Arrival date & time: 12/06/19  1817     History Chief Complaint  Patient presents with  . Epistaxis    Susan Fitzgerald is a 86 y.o. female presenting for evaluation of nosebleed.  Patient states bleeding began about 1 hour ago.  She bent forward to get something out of refrigerator when she started profusely bleeding.  No falls or trauma.  She has been on Eliquis for the past 3 to 4 years, no change in dose.  Patient states initially she tried to help her but had back and lay down to stop the bleeding, this did not help and instead she could feel the blood going down her throat.  Thus she came to the ER.  On the way over to the ER, she was pinching and holding pressure on her nose.  Bleeding stopped by the time she arrived to the ER.  She denies headaches, vision changes, recent URI symptoms including nasal congestion.  She has a history of high blood pressure, has been taking her medication as prescribed.  Patient states when she becomes stressed, usually when she is in the ER, she becomes extremely hypertensive.   HPI     Past Medical History:  Diagnosis Date  . Arthritis   . Bradycardia 2014  . Calcification of bronchial airway 06/23/2018   01/2017 - See on CT imaging of the chest   . Cataract   . Cerebral vascular disease 06/19/2018   06/19/2018 - MRI with chronic microvascular ischemic change  . Diverticulosis   . Dyspnea    periodically  . Dysrhythmia    a - fib  . Heart murmur    ??? bruit  . History of stress test    a. 04/09/13-nuclear stress-no ischemia low risk  . Hypertension   . Menopause   . Osteoporosis   . PAF (paroxysmal atrial fibrillation) ([place])    a. s/p prior DCCV; b. On flecainide & coumadin (CHA2DS2VASc = 4);  c. 03/2016 Echo: EF 55-60%, mod LVH, mod AI, mild to mod TR, PASP 76mmHg.  Marland Kitchen Pelvic fracture ([place])   . Pulmonary fibrosis ([place]) 2018  . Rheumatoid arthritis ([place]) 2017     Patient Active Problem List   Diagnosis Date Noted  . Medication management 10/05/2018  . Cough 09/30/2018  . Calcification of bronchial airway 06/23/2018  . Right ventricular enlargement 06/23/2018  . Interstitial pulmonary disease ([place]) 06/23/2018  . Elevated liver enzymes 06/23/2018  . Cerebral vascular disease 06/19/2018  . New onset of confusion 06/08/2018  . Weakness 02/25/2018  . Hypothyroidism 09/18/2017  . Spinal stenosis of lumbar region with neurogenic claudication 03/20/2017  . Avascular necrosis of hip, right ([place]) 01/27/2017  . Avascular necrosis of bone of right hip ([place]) 01/27/2017  . Abdominal aortic atherosclerosis ([place]) 09/24/2016  . Rheumatoid arthritis ([place]) 05/07/2016  . Symptomatic bradycardia 06/02/2013  . Hypertension 04/21/2013  . Osteoporosis 04/21/2013  . Atrial fibrillation, chronic ([place]) 02/09/2013    Past Surgical History:  Procedure Laterality Date  . BIOPSY BREAST    . BIOPSY BREAST     right & benign  . BREAST SURGERY    . CARDIOVERSION N/A 10/30/2016   Procedure: CARDIOVERSION;  Surgeon: Larey Dresser, MD;  Location: [facility];  Service: Cardiovascular;  Laterality: N/A;  . CARDIOVERSION N/A 08/20/2017   Procedure: CARDIOVERSION;  Surgeon: Jerline Pain, MD;  Location: [facility] ENDOSCOPY;  Service: Cardiovascular;  Laterality: N/A;  . CATARACT  EXTRACTION    . COLONOSCOPY    . EYE SURGERY     cataracts  . TEE WITHOUT CARDIOVERSION N/A 10/30/2016   Procedure: TRANSESOPHAGEAL ECHOCARDIOGRAM (TEE);  Surgeon: Larey Dresser, MD;  Location: [facility];  Service: Cardiovascular;  Laterality: N/A;  . TOTAL HIP ARTHROPLASTY Right 01/27/2017   Procedure: TOTAL HIP ARTHROPLASTY ANTERIOR APPROACH;  Surgeon: Rod Can, MD;  Location: [facility];  Service: Orthopedics;  Laterality: Right;     OB History    Gravida  2   Para  2   Term  2   Preterm      AB      Living  2     SAB      TAB      Ectopic      Multiple      Live  Births              Family History  Problem Relation Age of Onset  . Stroke Mother        cerebral hemorrhage  . Heart disease Father        MI  . Heart attack Father   . Vision loss Father   . Heart disease Brother   . Heart attack Brother   . Cancer Maternal Aunt        breast  . Cancer Brother   . Heart disease Brother   . Cancer Daughter        breast  . Colon cancer Neg Hx   . Colon polyps Neg Hx   . Rectal cancer Neg Hx   . Stomach cancer Neg Hx     Social History   Tobacco Use  . Smoking status: Never Smoker  . Smokeless tobacco: Never Used  Substance Use Topics  . Alcohol use: No  . Drug use: No    Home Medications Prior to Admission medications   Medication Sig Start Date End Date Taking? Authorizing Provider  ALPRAZolam Duanne Moron) 0.25 MG tablet Take 1/2 hour prior to Pulmonary Function Test. 11/22/19   Marshell Garfinkel, MD  apixaban (ELIQUIS) 2.5 MG TABS tablet Take 1 tablet (2.5 mg total) by mouth 2 (two) times daily. 05/19/19   Minus Breeding, MD  azaTHIOprine (IMURAN) 50 MG tablet Take 50 mg by mouth daily.  03/23/19   [provider]  calcium-vitamin D (OSCAL WITH D) 250-125 MG-UNIT per tablet Take 1 tablet by mouth 2 (two) times daily.    [provider]  Cyanocobalamin (VITAMIN B-12 IJ) Inject once a month    [provider]  diltiazem (CARDIZEM CD) 300 MG 24 hr capsule Take 1 capsule (300 mg total) by mouth daily. 04/14/19   Minus Breeding, MD  ESBRIET 267 MG TABS  10/04/19   [provider]  folic acid (FOLVITE) 1 MG tablet Take 1 mg by mouth daily.     [provider]  furosemide (LASIX) 20 MG tablet TAKE 1 TO 2 TABLETS BY  MOUTH DAILY AS NEEDED 05/14/19   Chipper Herb, MD  gabapentin (NEURONTIN) 100 MG capsule Take 100 mg by mouth at bedtime.     [provider]  levothyroxine (SYNTHROID, LEVOTHROID) 75 MCG tablet TAKE 1 TABLET BY MOUTH  DAILY 02/11/19   Chipper Herb, MD  lisinopril (ZESTRIL) 20  MG tablet TAKE 1 TABLET BY MOUTH  DAILY 11/08/19   Minus Breeding, MD  lisinopril-hydrochlorothiazide (ZESTORETIC) 20-12.5 MG tablet TAKE 1 TABLET BY MOUTH ONCE DAILY IN THE MORNING 11/08/19  Minus Breeding, MD  Melatonin 5 MG TABS Take 5 mg by mouth at bedtime.    [provider]  Multiple Vitamin (MULTIVITAMIN) capsule Take 1 capsule by mouth daily.    [provider]  ondansetron (ZOFRAN-ODT) 4 MG disintegrating tablet DISSOLVE 1 TABLET IN MOUTH EVERY 8 HOURS AS NEEDED FOR NAUSEA AND VOMITING 07/27/19   Lauraine Rinne, NP  potassium chloride SA (KLOR-CON) 20 MEQ tablet TAKE 1 TABLET BY MOUTH  DAILY 10/11/19   Ronnie Doss M, DO  Probiotic Product (PROBIOTIC-10 ULTIMATE PO) Take 1 tablet by mouth daily.    [provider]    Allergies    Benazepril hcl  Review of Systems   Review of Systems  HENT: Positive for nosebleeds.   Hematological: Bruises/bleeds easily.  All other systems reviewed and are negative.   Physical Exam Updated Vital Signs BP (!) 167/98 (BP Location: Right Arm)   Pulse 100   Temp 98.6 F (37 C) (Oral)   Resp 17   Ht 5\' 3"  (1.6 m)   Wt 53.1 kg   SpO2 96%   BMI 20.73 kg/m   Physical Exam Vitals and nursing note reviewed.  Constitutional:      General: She is not in acute distress.    Appearance: She is well-developed.     Comments: Sitting comfortably in the bed in no acute distress  HENT:     Head: Normocephalic and atraumatic.     Comments: No current epistaxis.  No obvious abnormality noted in the nares or nasal mucosal edema. Cardiovascular:     Rate and Rhythm: Normal rate and regular rhythm.     Pulses: Normal pulses.  Pulmonary:     Effort: Pulmonary effort is normal.     Breath sounds: Normal breath sounds.  Abdominal:     General: There is no distension.  Musculoskeletal:        General: Normal range of motion.     Cervical back: Normal range of motion.  Skin:    General: Skin is warm.     Capillary  Refill: Capillary refill takes less than 2 seconds.     Findings: No rash.  Neurological:     Mental Status: She is alert and oriented to person, place, and time.     ED Results / Procedures / Treatments   Labs (all labs ordered are listed, but only abnormal results are displayed) Labs Reviewed - No data to display  EKG None  Radiology No results found.  Procedures Procedures (including critical care time)  Medications Ordered in ED Medications - No data to display  ED Course  I have reviewed the triage vital signs and the nursing notes.  Pertinent labs & imaging results that were available during my care of the patient were reviewed by me and considered in my medical decision making (see chart for details).    MDM Rules/Calculators/A&P                      Pt presenting for evaluation of nosebleed.  Physical exam reassuring, she appears nontoxic.  Bleeding stopped prior to arrival.  She is on Eliquis, this is likely contributing to her nosebleed.  Patient is mildly hypertensive, this improved with rest and reassurance.  Likely stress related.  As bleeding has stopped, no further intervention performed in the ED.  Discussed that if bleeding is to continue or recur, she should hold firm pressure.  Discussed follow-up with ENT if symptoms persist.  Case discussed with attending, Dr. Roderic Palau agrees to plan.  This time, patient appears safe for discharge.  Return precautions given.  Patient states she understands and agrees to plan.  Final Clinical Impression(s) / ED Diagnoses Final diagnoses:  Epistaxis    Rx / DC Orders ED Discharge Orders    None       Franchot Heidelberg, PA-C 12/06/19 2213    Milton Ferguson, MD 12/06/19 2255

## 2019-12-06 NOTE — ED Triage Notes (Signed)
Patient states she began having a nosebleed x 45 minutes ago. Patient is on Eliquis.

## 2019-12-06 NOTE — Discharge Instructions (Signed)
Do not take your Eliquis tonight, restart tomorrow morning. If you have another nosebleed, hold from pressure for 20 minutes.  If bleeding does not stop, return to the emergency room. Do not touch, blow, or irritate your nose, this could prompt another nosebleed. Follow-up with the ear nose and throat doctor listed below if you have recurrent nosebleeds or as needed for further evaluation. Return to the emergency room if you develop severe headache, fevers, or new, worsening, or concerning symptoms.

## 2019-12-07 ENCOUNTER — Other Ambulatory Visit: Payer: Self-pay

## 2019-12-07 MED ORDER — DILTIAZEM HCL ER COATED BEADS 300 MG PO CP24: 300.0000 mg | ORAL_CAPSULE | Freq: Every day | ORAL | 3 refills | Status: DC

## 2019-12-11 ENCOUNTER — Telehealth: Payer: Self-pay | Admitting: Internal Medicine

## 2019-12-11 NOTE — Telephone Encounter (Signed)
Cardiology [facility] Note  Returned page from patient. She was in the ED on Monday of this week with epistaxis. She takes apixaban for stroke prevention in AF. No prior history of stroke. She stopped apixaban for 1 day then restarted. Epistaxis resolved.   Earlier today her epistaxis recurred. Says it started spontaneously without trauma or warning. Lasted about 1 hour then resolved spontaneously. Very concerning to her as she feels as though it was a large amount of blood earlier today.Took her AM dose of apixaban but hasn't taken her PM dose yet. Currently having no bleeding. No trouble breathing. Feels back to normal but feels scared that this will happen again.  I recommended the patient discontinue apixaban at this time and not restart until she hears back from Dr Percival Spanish. She does not take aspirin or any other antiplatelet medications. It sounds like she may benefit from getting in to see someone from ENT soon. I recommended that she keep a bottle of Afrin nearby in case it starts again. If her bleeding starts again she should spray 5 to 10 sprays of Afrin in the affected nostril and then come to the ED immediately. If she has no more bleeding, then she will remain off apixaban for the time being and wait to hear back from Dr. Percival Spanish.   The patient understands that she should go to the closest ED if her symptoms recur or if she develops any new/concerning symptoms. A copy of this note will be sent to Dr. Percival Spanish.   Marcie Mowers, MD Cardiology Fellow, PGY-7

## 2019-12-13 ENCOUNTER — Other Ambulatory Visit: Payer: Self-pay | Admitting: *Deleted

## 2019-12-13 ENCOUNTER — Encounter: Payer: Self-pay | Admitting: *Deleted

## 2019-12-13 ENCOUNTER — Telehealth: Payer: Self-pay | Admitting: Cardiology

## 2019-12-13 NOTE — Telephone Encounter (Signed)
Nees to hold anticoagulation until she can get the nose looked at.  Tell her to let us no if she cannot get anything scheduled and I will try tot move it up.

## 2019-12-13 NOTE — Telephone Encounter (Signed)
Spoke with patient and she had heavy nose bleeds 1/18 and 1/23  1/18 she went to ED 1/23 she was on her way to ED. She did speak with on call MD and was advised to hold Eliquis No Eliquis since Friday night  She has been using saline spray and humidifier Number to [facility] ENT given to patient, she will call for appointment  Will forward to [facility] for review

## 2019-12-13 NOTE — Telephone Encounter (Signed)
Patient calling to speak with Dr. Percival Spanish or his nurse. She has had 2 extreme nose bleeds. One in which sent her to the ER. She called the answering service over the weekend and was advised to stop eliquis until she hears from Dr. Percival Spanish. States she expected to hear from him by now and was following up.

## 2019-12-13 NOTE — Telephone Encounter (Signed)
Follow up    Patient is following up on Dr. Percival Spanish recommendation about stopping her Eliquis and nose bleeds.

## 2019-12-13 NOTE — Patient Outreach (Signed)
Referral from [facility] for pharmacy assistance.  Spoke with Susan Fitzgerald and completed a screening call. She reports she has been on some incredibly expensive medications for her pulmonary fibrosis and AFIB. However currently, she is getting her medication with the assistance of [facility] and a grant. Since it is January and the start of a new year her insurance pays for her Eliquis also.  She reports when she goes into the donut whole it is difficult to pay for the Eliquis.  She reports both she and her husband had COVID in November but they made it through the illness without hospitalization.  She has had a trip to the ED recently for a nose bleed. The bleeding had actually stopped by the time she got to the hospital. Her blood pressure was elevated and they monitored that until it came back down to 140/80.  She has been running a humidiifer and using saline spray to moisturize her nasal passages. She has also been told that if she bleeds again, she can try a spray of Afrin to constrict the nasal capillaries and help stop the bleeding.   NP has requested that pt call when she knows she is approaching the donut hole so that we may be able to apply for assistance for her Eliquis.  Sending a successful outreach letter. No other care management needs at this time.  Susan Fitzgerald. Susan Neither, MSN, [facility] Gerontological Nurse Practitioner [facility] Care Management [phone]

## 2019-12-13 NOTE — Telephone Encounter (Signed)
Advised patient, verbalized understanding. She has appointment with ENT tomorrow and will call back with update

## 2019-12-14 ENCOUNTER — Telehealth: Payer: Self-pay | Admitting: Cardiology

## 2019-12-14 ENCOUNTER — Ambulatory Visit: Payer: Medicare Other

## 2019-12-14 DIAGNOSIS — R040 Epistaxis: Secondary | ICD-10-CM | POA: Diagnosis not present

## 2019-12-14 DIAGNOSIS — I482 Chronic atrial fibrillation, unspecified: Secondary | ICD-10-CM | POA: Diagnosis not present

## 2019-12-14 NOTE — Telephone Encounter (Signed)
Spoke with patient - first nosebleed was on 1/18, second on 1/23 - this one lasted an hour.  Pt got close to ED when stopped so went home.  Went to ENT today and they suggested holding Eliquis until Feb 1.  Patient concerned that this will be 9 days without Eliquis.  Reviewed chart.  Pt has CHADS2-VASc score of 5 (age x 2, female, htn, cad).  No bleeding since Saturday morning.   Suggested that patient try restarting Eliquis on Thursday, this will give her 5 days off since last nose bleed.  She is scheduled to see Dr. Percival Spanish in [place] next week.

## 2019-12-14 NOTE — Telephone Encounter (Signed)
Will route to PharmD to advise on the Eliquis.

## 2019-12-14 NOTE — Telephone Encounter (Signed)
  Pt c/o medication issue:  1. Name of Medication: apixaban (ELIQUIS) 2.5 MG TABS tablet  2. How are you currently taking this medication (dosage and times per day)? Twice a day  3. Are you having a reaction (difficulty breathing--STAT)? no  4. What is your medication issue? Patient calling stating she saw a ear nose and throat doctor this morning for her nose bleeds. He told her to stop taking Eliquis until 2/1, which would be 9 days off the medication. She would like to know if this would be okay.

## 2019-12-17 DIAGNOSIS — M5136 Other intervertebral disc degeneration, lumbar region: Secondary | ICD-10-CM | POA: Diagnosis not present

## 2019-12-17 DIAGNOSIS — M0579 Rheumatoid arthritis with rheumatoid factor of multiple sites without organ or systems involvement: Secondary | ICD-10-CM | POA: Diagnosis not present

## 2019-12-17 DIAGNOSIS — M5030 Other cervical disc degeneration, unspecified cervical region: Secondary | ICD-10-CM | POA: Diagnosis not present

## 2019-12-17 DIAGNOSIS — M150 Primary generalized (osteo)arthritis: Secondary | ICD-10-CM | POA: Diagnosis not present

## 2019-12-17 DIAGNOSIS — R7989 Other specified abnormal findings of blood chemistry: Secondary | ICD-10-CM | POA: Diagnosis not present

## 2019-12-20 DIAGNOSIS — Z7189 Other specified counseling: Secondary | ICD-10-CM | POA: Insufficient documentation

## 2019-12-20 NOTE — Progress Notes (Signed)
Cardiology Office Note   Date:  12/22/2019   ID:  Susan, Fitzgerald [birth year], MRN [id number]  PCP:  [name], DO  Cardiologist:   Minus Breeding, MD   Chief Complaint  Patient presents with  . Epistaxis      History of Present Illness: Susan Fitzgerald is a 86 y.o. female who presents for evaluation of atrial fibrillation. She was cardioverted. However, she developed recurrent atrial fib. She had her flecainide does reduced because of a prolonged QT interval. The flecainide was subsequently discontinued as she had reverted to atrial fib. She has been followed in the atrial fib clinic. She did describe chest pain and was sent for [facility] which did not suggest ischemia. She does have a mildly reduced EF (40 - 45% on echo.) Her last echo in March 2019 demonstrated an improved EF of 55 - 60%. She was started on amiodarone and underwent DCCV. She had increased dyspnea and decreased O2 sats. She was treated for pneumonia. She was referred to pulmonary. A CT was done. There was a thought that she might have an amiodarone pulmonary process. Other processes could not be excluded such as a reaction to her methotrexate. There was also suggestion of pulmonary edema. She has been treated with diuresis. She did have an increased creat but this is improved. She was treated with steroids and did improve.She has not been told that she has idiopathic idiopathicpulmonary fibrosis.  She wore a Holter.   Her rate is elevated and she is 100% atrial fibrillation.    Most recently she had nosebleeds and had to hold the Eliquis.  She saw ENT.  She had no acute bleeding and was managed conservatively.   This was her second episode of bleeding in a few days.  Since then she has been using saline and taking other precautions.  She restarted the anticoagulation and has had no further bleeding although she is quite anxious about this.  She has been in A. fib persistently  now.  However, the rate seems to be controlled and she feels me rates that were reasonably controlled.  She has had no presyncope or syncope.  She has had no new chest pressure, neck or arm discomfort.   Past Medical History:  Diagnosis Date  . Arthritis   . Bradycardia 2014  . Calcification of bronchial airway 06/23/2018   01/2017 - See on CT imaging of the chest   . Cataract   . Cerebral vascular disease 06/19/2018   06/19/2018 - MRI with chronic microvascular ischemic change  . Diverticulosis   . Dyspnea    periodically  . Dysrhythmia    a - fib  . Heart murmur    ??? bruit  . History of stress test    a. 04/09/13-nuclear stress-no ischemia low risk  . Hypertension   . Menopause   . Osteoporosis   . PAF (paroxysmal atrial fibrillation) ([place])    a. s/p prior DCCV; b. On flecainide & coumadin (CHA2DS2VASc = 4);  c. 03/2016 Echo: EF 55-60%, mod LVH, mod AI, mild to mod TR, PASP 100mmHg.  Marland Kitchen Pelvic fracture ([place])   . Pulmonary fibrosis ([place]) 2018  . Rheumatoid arthritis ([place]) 2017    Past Surgical History:  Procedure Laterality Date  . BIOPSY BREAST    . BIOPSY BREAST     right & benign  . BREAST SURGERY    . CARDIOVERSION N/A 10/30/2016   Procedure: CARDIOVERSION;  Surgeon:  Larey Dresser, MD;  Location: [place];  Service: Cardiovascular;  Laterality: N/A;  . CARDIOVERSION N/A 08/20/2017   Procedure: CARDIOVERSION;  Surgeon: Jerline Pain, MD;  Location[facility];  Service: Cardiovascular;  Laterality: N/A;  . CATARACT EXTRACTION    . COLONOSCOPY    . EYE SURGERY     cataracts  . TEE WITHOUT CARDIOVERSION N/A 10/30/2016   Procedure: TRANSESOPHAGEAL ECHOCARDIOGRAM (TEE);  Surgeon: Larey Dresser, MD;  Location: [place];  Service: Cardiovascular;  Laterality: N/A;  . TOTAL HIP ARTHROPLASTY Right 01/27/2017   Procedure: TOTAL HIP ARTHROPLASTY ANTERIOR APPROACH;  Surgeon: Rod Can, MD;  Location: [place];  Service: Orthopedics;  Laterality: Right;     Current  Outpatient Medications  Medication Sig Dispense Refill  . apixaban (ELIQUIS) 2.5 MG TABS tablet Take 1 tablet (2.5 mg total) by mouth 2 (two) times daily. 180 tablet 3  . azaTHIOprine (IMURAN) 50 MG tablet Take 50 mg by mouth daily.     . calcium-vitamin D (OSCAL WITH D) 250-125 MG-UNIT per tablet Take 1 tablet by mouth 2 (two) times daily.    Marland Kitchen diltiazem (CARDIZEM CD) 300 MG 24 hr capsule Take 1 capsule (300 mg total) by mouth daily. 90 capsule 3  . ESBRIET 267 MG TABS 267 mg. Take three tablets by mouth three times daily    . folic acid (FOLVITE) 1 MG tablet Take 1 mg by mouth daily.     . furosemide (LASIX) 20 MG tablet TAKE 1 TO 2 TABLETS BY  MOUTH DAILY AS NEEDED 180 tablet 1  . gabapentin (NEURONTIN) 100 MG capsule Take 100 mg by mouth at bedtime.     Marland Kitchen levothyroxine (SYNTHROID, LEVOTHROID) 75 MCG tablet TAKE 1 TABLET BY MOUTH  DAILY 90 tablet 3  . lisinopril (ZESTRIL) 20 MG tablet TAKE 1 TABLET BY MOUTH  DAILY 90 tablet 3  . lisinopril-hydrochlorothiazide (ZESTORETIC) 20-12.5 MG tablet TAKE 1 TABLET BY MOUTH ONCE DAILY IN THE MORNING 90 tablet 3  . Melatonin 5 MG TABS Take 5 mg by mouth at bedtime.    . Multiple Vitamin (MULTIVITAMIN) capsule Take 1 capsule by mouth daily.    . potassium chloride SA (KLOR-CON) 20 MEQ tablet TAKE 1 TABLET BY MOUTH  DAILY 90 tablet 0  . Probiotic Product (PROBIOTIC-10 ULTIMATE PO) Take 1 tablet by mouth daily.    Marland Kitchen ALPRAZolam (XANAX) 0.25 MG tablet Take 1/2 hour prior to Pulmonary Function Test. (Patient not taking: Reported on 12/22/2019) 1 tablet 0  . Cyanocobalamin (VITAMIN B-12 IJ) Inject once a month    . ondansetron (ZOFRAN-ODT) 4 MG disintegrating tablet DISSOLVE 1 TABLET IN MOUTH EVERY 8 HOURS AS NEEDED FOR NAUSEA AND VOMITING (Patient not taking: Reported on 12/22/2019) 30 tablet 0   Current Facility-Administered Medications  Medication Dose Route Frequency Provider Last Rate Last Admin  . cyanocobalamin ((VITAMIN B-12)) injection 1,000 mcg  1,000  mcg Intramuscular Q30 days Chipper Herb, MD   1,000 mcg at 12/01/19 1124    Allergies:   Benazepril hcl    ROS:  Please see the history of present illness.   Otherwise, review of systems are positive for none.   All other systems are reviewed and negative.    PHYSICAL EXAM: VS:  BP (!) 110/58   Pulse (!) 103   Ht 5' (1.524 m)   Wt 119 lb (54 kg)   BMI 23.24 kg/m  , BMI Body mass index is 23.24 kg/m. GENERAL:  Well appearing NECK:  No jugular venous distention, waveform within normal limits, carotid upstroke brisk and symmetric, no bruits, no thyromegaly LUNGS:  Clear to auscultation bilaterally CHEST:  Unremarkable HEART:  PMI not displaced or sustained,S1 and S2 within normal limits, no S3, no clicks, no rubs, no murmurs, irregular ABD:  Flat, positive bowel sounds normal in frequency in pitch, no bruits, no rebound, no guarding, no midline pulsatile mass, no hepatomegaly, no splenomegaly EXT:  2 plus pulses throughout, no edema, no cyanosis no clubbing   EKG:  EKG is ordered today. The ekg ordered today demonstrates atrial fibrillation, rate 103, left axis deviation, poor anterior R wave progression, no acute ST-T wave changes.   Recent Labs: 07/02/2019: TSH 1.340 11/22/2019: ALT 8; BUN 25; Creatinine, Ser 1.14; Hemoglobin 12.4; Platelets 155; Potassium 3.8; Sodium 142    Lipid Panel    Component Value Date/Time   CHOL 153 04/20/2019 0828   CHOL 131 04/14/2013 0836   TRIG 66 04/20/2019 0828   TRIG 67 09/15/2017 0845   TRIG 69 04/14/2013 0836   HDL 71 04/20/2019 0828   HDL 71 09/15/2017 0845   HDL 53 04/14/2013 0836   CHOLHDL 2.2 04/20/2019 0828   LDLCALC 69 04/20/2019 0828   LDLCALC 64 04/14/2013 0836   LDLDIRECT 98 04/20/2014 1015      Wt Readings from Last 3 Encounters:  12/22/19 119 lb (54 kg)  12/06/19 117 lb (53.1 kg)  11/01/19 111 lb (50.3 kg)      Other studies Reviewed: Additional studies/ records that were reviewed today include: ED records, ENT  records. Review of the above records demonstrates:  Please see elsewhere in the note.     ASSESSMENT AND PLAN:  HTN:     Blood pressures actually been running slightly low.  However, gets a little bit labile so I will not make any changes.  If it runs low in the future I will titrate her meds down although keeping her meds for rate control.   ATRIAL FIB:   Ms. HILLARIE HARRIGAN has a CHA2DS2 - VASc score of 4.   I reviewed all the data as above.  We had a long discussion about the risks of further nosebleeds.  I think if she has another episode where she has to go to the emergency room and see ENT and there is nothing that can be cauterized or mechanically treated then she had to stop the anticoagulation and we would have to consider watchman.  For now she will continue the meds as listed.  I will see her back in 1 month and consider whether she needs cardioversion.  She has to be on anticoagulation uninterrupted however.  DYSPNEA:    This is baseline.  She is due for pulmonary function testing.  COVID EDUCATION: She is doing the vaccine later this week.   Current medicines are reviewed at length with the patient today.  The patient does not have concerns regarding medicines.  The following changes have been made:  no change  Labs/ tests ordered today include:   Orders Placed This Encounter  Procedures  . EKG 12-Lead     Disposition:   FU with me in one month.     Signed, Minus Breeding, MD  12/22/2019 2:17 PM    [facility]

## 2019-12-21 ENCOUNTER — Other Ambulatory Visit: Payer: Self-pay | Admitting: Family Medicine

## 2019-12-21 ENCOUNTER — Other Ambulatory Visit: Payer: Medicare Other

## 2019-12-21 DIAGNOSIS — D485 Neoplasm of uncertain behavior of skin: Secondary | ICD-10-CM | POA: Diagnosis not present

## 2019-12-21 DIAGNOSIS — D225 Melanocytic nevi of trunk: Secondary | ICD-10-CM | POA: Diagnosis not present

## 2019-12-21 DIAGNOSIS — D1801 Hemangioma of skin and subcutaneous tissue: Secondary | ICD-10-CM | POA: Diagnosis not present

## 2019-12-21 DIAGNOSIS — L821 Other seborrheic keratosis: Secondary | ICD-10-CM | POA: Diagnosis not present

## 2019-12-21 DIAGNOSIS — L905 Scar conditions and fibrosis of skin: Secondary | ICD-10-CM | POA: Diagnosis not present

## 2019-12-21 DIAGNOSIS — L218 Other seborrheic dermatitis: Secondary | ICD-10-CM | POA: Diagnosis not present

## 2019-12-21 DIAGNOSIS — L820 Inflamed seborrheic keratosis: Secondary | ICD-10-CM | POA: Diagnosis not present

## 2019-12-21 DIAGNOSIS — R399 Unspecified symptoms and signs involving the genitourinary system: Secondary | ICD-10-CM

## 2019-12-21 LAB — URINALYSIS, COMPLETE
Bilirubin, UA: NEGATIVE
Glucose, UA: NEGATIVE
Ketones, UA: NEGATIVE
Leukocytes,UA: NEGATIVE
Nitrite, UA: NEGATIVE
Protein,UA: NEGATIVE
RBC, UA: NEGATIVE
Specific Gravity, UA: 1.02 (ref 1.005–1.030)
Urobilinogen, Ur: 0.2 mg/dL (ref 0.2–1.0)
pH, UA: 7 (ref 5.0–7.5)

## 2019-12-21 LAB — MICROSCOPIC EXAMINATION
Bacteria, UA: NONE SEEN
RBC, Urine: NONE SEEN /hpf (ref 0–2)
Renal Epithel, UA: NONE SEEN /hpf

## 2019-12-22 ENCOUNTER — Ambulatory Visit (INDEPENDENT_AMBULATORY_CARE_PROVIDER_SITE_OTHER): Payer: Medicare Other

## 2019-12-22 ENCOUNTER — Other Ambulatory Visit: Payer: Self-pay

## 2019-12-22 ENCOUNTER — Ambulatory Visit (INDEPENDENT_AMBULATORY_CARE_PROVIDER_SITE_OTHER): Payer: Medicare Other | Admitting: Family Medicine

## 2019-12-22 ENCOUNTER — Encounter: Payer: Self-pay | Admitting: Cardiology

## 2019-12-22 ENCOUNTER — Ambulatory Visit: Payer: Medicare Other | Admitting: Cardiology

## 2019-12-22 VITALS — BP 110/58 | HR 103 | Ht 60.0 in | Wt 119.0 lb

## 2019-12-22 DIAGNOSIS — R040 Epistaxis: Secondary | ICD-10-CM | POA: Diagnosis not present

## 2019-12-22 DIAGNOSIS — M545 Low back pain, unspecified: Secondary | ICD-10-CM

## 2019-12-22 DIAGNOSIS — R0602 Shortness of breath: Secondary | ICD-10-CM

## 2019-12-22 DIAGNOSIS — Z7189 Other specified counseling: Secondary | ICD-10-CM | POA: Diagnosis not present

## 2019-12-22 DIAGNOSIS — I10 Essential (primary) hypertension: Secondary | ICD-10-CM

## 2019-12-22 DIAGNOSIS — I4821 Permanent atrial fibrillation: Secondary | ICD-10-CM | POA: Diagnosis not present

## 2019-12-22 NOTE — Patient Instructions (Signed)
Acute Back Pain, Adult Acute back pain is sudden and usually short-lived. It is often caused by an injury to the muscles and tissues in the back. The injury may result from:  A muscle or ligament getting overstretched or torn (strained). Ligaments are tissues that connect bones to each other. Lifting something improperly can cause a back strain.  Wear and tear (degeneration) of the spinal disks. Spinal disks are circular tissue that provides cushioning between the bones of the spine (vertebrae).  Twisting motions, such as while playing sports or doing yard work.  A hit to the back.  Arthritis. You may have a physical exam, lab tests, and imaging tests to find the cause of your pain. Acute back pain usually goes away with rest and home care. Follow these instructions at home: Managing pain, stiffness, and swelling  Take over-the-counter and prescription medicines only as told by your health care provider.  Your health care provider may recommend applying ice during the first 24-48 hours after your pain starts. To do this: ? Put ice in a plastic bag. ? Place a towel between your skin and the bag. ? Leave the ice on for 20 minutes, 2-3 times a day.  If directed, apply heat to the affected area as often as told by your health care provider. Use the heat source that your health care provider recommends, such as a moist heat pack or a heating pad. ? Place a towel between your skin and the heat source. ? Leave the heat on for 20-30 minutes. ? Remove the heat if your skin turns bright red. This is especially important if you are unable to feel pain, heat, or cold. You have a greater risk of getting burned. Activity   Do not stay in bed. Staying in bed for more than 1-2 days can delay your recovery.  Sit up and stand up straight. Avoid leaning forward when you sit, or hunching over when you stand. ? If you work at a desk, sit close to it so you do not need to lean over. Keep your chin tucked  in. Keep your neck drawn back, and keep your elbows bent at a right angle. Your arms should look like the letter "L." ? Sit high and close to the steering wheel when you drive. Add lower back (lumbar) support to your car seat, if needed.  Take short walks on even surfaces as soon as you are able. Try to increase the length of time you walk each day.  Do not sit, drive, or stand in one place for more than 30 minutes at a time. Sitting or standing for long periods of time can put stress on your back.  Do not drive or use heavy machinery while taking prescription pain medicine.  Use proper lifting techniques. When you bend and lift, use positions that put less stress on your back: ? Bend your knees. ? Keep the load close to your body. ? Avoid twisting.  Exercise regularly as told by your health care provider. Exercising helps your back heal faster and helps prevent back injuries by keeping muscles strong and flexible.  Work with a physical therapist to make a safe exercise program, as recommended by your health care provider. Do any exercises as told by your physical therapist. Lifestyle  Maintain a healthy weight. Extra weight puts stress on your back and makes it difficult to have good posture.  Avoid activities or situations that make you feel anxious or stressed. Stress and anxiety increase muscle   tension and can make back pain worse. Learn ways to manage anxiety and stress, such as through exercise. General instructions  Sleep on a firm mattress in a comfortable position. Try lying on your side with your knees slightly bent. If you lie on your back, put a pillow under your knees.  Follow your treatment plan as told by your health care provider. This may include: ? Cognitive or behavioral therapy. ? Acupuncture or massage therapy. ? Meditation or yoga. Contact a health care provider if:  You have pain that is not relieved with rest or medicine.  You have increasing pain going down  into your legs or buttocks.  Your pain does not improve after 2 weeks.  You have pain at night.  You lose weight without trying.  You have a fever or chills. Get help right away if:  You develop new bowel or bladder control problems.  You have unusual weakness or numbness in your arms or legs.  You develop nausea or vomiting.  You develop abdominal pain.  You feel faint. Summary  Acute back pain is sudden and usually short-lived.  Use proper lifting techniques. When you bend and lift, use positions that put less stress on your back.  Take over-the-counter and prescription medicines and apply heat or ice as directed by your health care provider. This information is not intended to replace advice given to you by your health care provider. Make sure you discuss any questions you have with your health care provider. Document Revised: 02/23/2019 Document Reviewed: 06/18/2017 Elsevier Patient Education  2020 Elsevier Inc.  

## 2019-12-22 NOTE — Progress Notes (Signed)
Telephone visit  Subjective: CC: low back pain PCP: Susan Norlander, DO FTD:Susan Fitzgerald is a 86 y.o. female calls for telephone consult today. Patient provides verbal consent for consult held via phone.  Due to COVID-19 pandemic this visit was conducted virtually. This visit type was conducted due to national recommendations for restrictions regarding the COVID-19 Pandemic (e.g. social distancing, sheltering in place) in an effort to limit this patient's exposure and mitigate transmission in our community. All issues noted in this document were discussed and addressed.  A physical exam was not performed with this format.   Location of patient: home Location of provider: Working remotely from home Others present for call: none  1. Low back pain She has chronic low back pain but pain has gotten somewhat worse with getting up and starting to walk.  Pain is located in the lower back below the belt line. She left a urine Fitzgerald.  No radiation of pain.  She has been using Tylenol 500mg  with some relief and icy hot.  Cannot use NSAIDs due to Eliquis use.  Denies numbness and tingling, LE weakness, urinary retention or fecal incontinence.  She has upcoming PFTs on 2/26.  She has a COVID test prior to that.  Patient reports that over the last 10 days has "been an adventure".  She has had 2 nosebleeds, one of which she went to the ED for.  She has seen ENT but no interventions were planned.  She has been using nasal saline spray and gel.  She is scheduled for the COVID vaccine this Friday.   ROS: Per HPI  Allergies  Allergen Reactions  . Benazepril Hcl Cough   Past Medical History:  Diagnosis Date  . Arthritis   . Bradycardia 2014  . Calcification of bronchial airway 06/23/2018   01/2017 - See on CT imaging of the chest   . Cataract   . Cerebral vascular disease 06/19/2018   06/19/2018 - MRI with chronic microvascular ischemic change  . Diverticulosis   . Dyspnea    periodically  .  Dysrhythmia    a - fib  . Heart murmur    ??? bruit  . History of stress test    a. 04/09/13-nuclear stress-no ischemia low risk  . Hypertension   . Menopause   . Osteoporosis   . PAF (paroxysmal atrial fibrillation) ([place])    a. s/p prior DCCV; b. On flecainide & coumadin (CHA2DS2VASc = 4);  c. 03/2016 Echo: EF 55-60%, mod LVH, mod AI, mild to mod TR, PASP 17mmHg.  Marland Kitchen Pelvic fracture ([place])   . Pulmonary fibrosis ([place]) 2018  . Rheumatoid arthritis ([place]) 2017    Current Outpatient Medications:  .  ALPRAZolam (XANAX) 0.25 MG tablet, Take 1/2 hour prior to Pulmonary Function Test., Disp: 1 tablet, Rfl: 0 .  apixaban (ELIQUIS) 2.5 MG TABS tablet, Take 1 tablet (2.5 mg total) by mouth 2 (two) times daily., Disp: 180 tablet, Rfl: 3 .  azaTHIOprine (IMURAN) 50 MG tablet, Take 50 mg by mouth daily. , Disp: , Rfl:  .  calcium-vitamin D (OSCAL WITH D) 250-125 MG-UNIT per tablet, Take 1 tablet by mouth 2 (two) times daily., Disp: , Rfl:  .  Cyanocobalamin (VITAMIN B-12 IJ), Inject once a month, Disp: , Rfl:  .  diltiazem (CARDIZEM CD) 300 MG 24 hr capsule, Take 1 capsule (300 mg total) by mouth daily., Disp: 90 capsule, Rfl: 3 .  ESBRIET 267 MG TABS, , Disp: , Rfl:  .  folic  acid (FOLVITE) 1 MG tablet, Take 1 mg by mouth daily. , Disp: , Rfl:  .  furosemide (LASIX) 20 MG tablet, TAKE 1 TO 2 TABLETS BY  MOUTH DAILY AS NEEDED, Disp: 180 tablet, Rfl: 1 .  gabapentin (NEURONTIN) 100 MG capsule, Take 100 mg by mouth at bedtime. , Disp: , Rfl:  .  levothyroxine (SYNTHROID, LEVOTHROID) 75 MCG tablet, TAKE 1 TABLET BY MOUTH  DAILY, Disp: 90 tablet, Rfl: 3 .  lisinopril (ZESTRIL) 20 MG tablet, TAKE 1 TABLET BY MOUTH  DAILY, Disp: 90 tablet, Rfl: 3 .  lisinopril-hydrochlorothiazide (ZESTORETIC) 20-12.5 MG tablet, TAKE 1 TABLET BY MOUTH ONCE DAILY IN THE MORNING, Disp: 90 tablet, Rfl: 3 .  Melatonin 5 MG TABS, Take 5 mg by mouth at bedtime., Disp: , Rfl:  .  Multiple Vitamin (MULTIVITAMIN) capsule, Take 1  capsule by mouth daily., Disp: , Rfl:  .  ondansetron (ZOFRAN-ODT) 4 MG disintegrating tablet, DISSOLVE 1 TABLET IN MOUTH EVERY 8 HOURS AS NEEDED FOR NAUSEA AND VOMITING, Disp: 30 tablet, Rfl: 0 .  potassium chloride SA (KLOR-CON) 20 MEQ tablet, TAKE 1 TABLET BY MOUTH  DAILY, Disp: 90 tablet, Rfl: 0 .  Probiotic Product (PROBIOTIC-10 ULTIMATE PO), Take 1 tablet by mouth daily., Disp: , Rfl:   Current Facility-Administered Medications:  .  cyanocobalamin ((VITAMIN B-12)) injection 1,000 mcg, 1,000 mcg, Intramuscular, Q30 days, Susan Herb, MD, 1,000 mcg at 12/01/19 1124  Assessment/ Plan: 87 y.o. female   1. Acute bilateral low back pain without sciatica Acute on chronic.  Check xray.  I reviewed her CT from 2018 which showed degenerative changes at that time.  Discussed consideration for muscle relaxer +/- prednisone.  Not a candidate for NSAIDs.  Pain is relatively controlled with Tylenol.  Ok to take 1 tablet 4 times daily prn.   - DG Lumbar Spine 2-3 Views; Future  2. Epistaxis Possibly related to dry air + use of Eliquis.  Now resolved.  We discussed continued use of saline spray, humidification and use of Afrin if needed in the future.  Follow up as needed on this issue  Start time: 7:58am End time: 8:17am  Total time spent on patient care (including telephone call/ virtual visit): 22 minutes  [place], [place] [phone]

## 2019-12-22 NOTE — Patient Instructions (Signed)
Medication Instructions:  The current medical regimen is effective;  continue present plan and medications.  *If you need a refill on your cardiac medications before your next appointment, please call your pharmacy*  Follow-Up: At [facility], you and your health needs are our priority.  As part of our continuing mission to provide you with exceptional heart care, we have created designated Provider Care Teams.  These Care Teams include your primary Cardiologist (physician) and Advanced Practice Providers (APPs -  Physician Assistants and Nurse Practitioners) who all work together to provide you with the care you need, when you need it.  Your next appointment:   4 week(s)  The format for your next appointment:   In Person  Provider:   Minus Breeding, MD  Thank you for choosing [facility]!!

## 2019-12-23 ENCOUNTER — Telehealth: Payer: Self-pay | Admitting: Adult Health

## 2019-12-23 ENCOUNTER — Ambulatory Visit: Payer: Medicare Other

## 2019-12-23 LAB — URINE CULTURE

## 2019-12-23 NOTE — Telephone Encounter (Signed)
Pt calling about her covid test on 2.23. pt stated that she been having nose bleeds and she is scared that when they swabbed her nose that it will onset the nose bleeding again. She spoke with her cardiologist and they also informed her that it may onset the nose bleeding again.

## 2019-12-23 NOTE — Telephone Encounter (Signed)
Called and spoke to pt. Pt states she has recently had 2 bad nose bleeds (12/06/2019 and 12/10/2019), lasting over 1.5 hours, pt has seen ENT. Pt states she had to go to the ED for one episodes. Pt is questioning if she could postpone her PFT to a later date due to the epistaxis episodes being so recent and the COVID swab potentially causing another nose bleed. Pt also questioning if she needs a follow up now (per last OV, she is due for f/u now) or if she can wait since she isnt doing the PFT. Pt states overall she feels well and denies any new s/s. Advised pt we can reschedule her to the next available PFT which will be in March, but pt is requesting message be sent to Dr. [name] Browner before any new appts are made or cancelled.   Dr. [name] Browner please advise. Thanks.

## 2019-12-23 NOTE — Telephone Encounter (Signed)
Called pt and advised message from the provider. Pt understood and verbalized understanding. Nothing further is needed.    

## 2019-12-23 NOTE — Telephone Encounter (Signed)
Agree that we can reschedule the PFTs

## 2019-12-23 NOTE — Telephone Encounter (Signed)
I cancelled covid test and appts for now.

## 2019-12-24 ENCOUNTER — Ambulatory Visit: Payer: Medicare Other | Attending: Internal Medicine

## 2019-12-24 DIAGNOSIS — Z23 Encounter for immunization: Secondary | ICD-10-CM | POA: Insufficient documentation

## 2019-12-24 NOTE — Progress Notes (Signed)
   Covid-19 Vaccination Clinic  Name:  [name]    MRN: [id number] DOB: August 01, 1932  12/24/2019  Susan Fitzgerald was observed post Covid-19 immunization for 15 minutes without incidence. She was provided with Vaccine Information Sheet and instruction to access the V-Safe system.   Susan Fitzgerald was instructed to call 911 with any severe reactions post vaccine: Marland Kitchen Difficulty breathing  . Swelling of your face and throat  . A fast heartbeat  . A bad rash all over your body  . Dizziness and weakness    Immunizations Administered    Name Date Dose VIS Date Route   Pfizer COVID-19 Vaccine 12/24/2019  9:57 AM 0.3 mL 10/29/2019 Intramuscular   Manufacturer: [place]   Lot: [id number]   [place]: [id number]

## 2019-12-31 ENCOUNTER — Other Ambulatory Visit: Payer: Self-pay

## 2020-01-03 ENCOUNTER — Other Ambulatory Visit: Payer: Self-pay

## 2020-01-03 ENCOUNTER — Ambulatory Visit (INDEPENDENT_AMBULATORY_CARE_PROVIDER_SITE_OTHER): Payer: Medicare Other | Admitting: *Deleted

## 2020-01-03 DIAGNOSIS — L659 Nonscarring hair loss, unspecified: Secondary | ICD-10-CM | POA: Diagnosis not present

## 2020-01-03 DIAGNOSIS — E538 Deficiency of other specified B group vitamins: Secondary | ICD-10-CM | POA: Diagnosis not present

## 2020-01-04 ENCOUNTER — Encounter: Payer: Self-pay | Admitting: Family Medicine

## 2020-01-04 LAB — VITAMIN B12: Vitamin B-12: >2000 pg/mL — ABNORMAL HIGH (ref 232–1245)

## 2020-01-11 ENCOUNTER — Other Ambulatory Visit (HOSPITAL_COMMUNITY): Payer: Medicare Other

## 2020-01-14 ENCOUNTER — Ambulatory Visit: Payer: Medicare Other | Admitting: Adult Health

## 2020-01-19 ENCOUNTER — Ambulatory Visit: Payer: Medicare Other | Attending: Internal Medicine

## 2020-01-19 DIAGNOSIS — Z23 Encounter for immunization: Secondary | ICD-10-CM | POA: Insufficient documentation

## 2020-01-19 NOTE — Progress Notes (Signed)
   Covid-19 Vaccination Clinic  Name:  Susan Fitzgerald    MRN: [id number] DOB: 10/07/1932  01/19/2020  [name] was observed post Covid-19 immunization for 15 minutes without incident. She was provided with Vaccine Information Sheet and instruction to access the V-Safe system.   [name] was instructed to call 911 with any severe reactions post vaccine: Marland Kitchen Difficulty breathing  . Swelling of face and throat  . A fast heartbeat  . A bad rash all over body  . Dizziness and weakness   Immunizations Administered    Name Date Dose VIS Date Route   Pfizer COVID-19 Vaccine 01/19/2020  9:54 AM 0.3 mL 10/29/2019 Intramuscular   Manufacturer: [place]   Lot: [id number]   [place]: [id number]

## 2020-01-21 ENCOUNTER — Other Ambulatory Visit: Payer: Self-pay | Admitting: Family Medicine

## 2020-01-25 NOTE — Progress Notes (Signed)
Cardiology Office Note   Date:  01/26/2020   ID:  Daylee, Delahoz 1932-04-30, MRN [id number]  PCP:  [name], DO  Cardiologist:   Minus Breeding, MD   No chief complaint on file.     History of Present Illness: Susan Fitzgerald is a 86 y.o. female who presents for evaluation of atrial fibrillation. She was cardioverted. However, she developed recurrent atrial fib. She had her flecainide does reduced because of a prolonged QT interval. The flecainide was subsequently discontinued as she had reverted to atrial fib. She has been followed in the atrial fib clinic. She did describe chest pain and was sent for [facility] which did not suggest ischemia. She does have a mildly reduced EF (40 - 45% on echo.) Her last echo in March 2019 demonstrated an improved EF of 55 - 60%. She was started on amiodarone and underwent DCCV. She had increased dyspnea and decreased O2 sats. She was treated for pneumonia. She was referred to pulmonary. A CT was done. There was a thought that she might have an amiodarone pulmonary process. Other processes could not be excluded such as a reaction to her methotrexate. There was also suggestion of pulmonary edema. She has been treated with diuresis. She did have an increased creat but this is improved. She was treated with steroids and did improve.She has not been told that she has idiopathic idiopathicpulmonary fibrosis.  She wore a Holter.   Her rate is elevated and she is 100% atrial fibrillation.    She had nosebleeds and had to hold the Eliquis.  She saw ENT.  She had no acute bleeding and was managed conservatively.   Since I last saw her she has not had any further nosebleeds.  She has been doing.  She has been doing good nares care.  He does still have breathlessness which he thinks is a little worse with her fibrillation.  She knows her heart is out of rhythm but she is not seeing any real rapid rates.  Today's heart  rate is actually higher than previous.  She is not having any presyncope or syncope.  She is not having any chest pressure, neck or arm discomfort.  She has had no weight gain or edema.   Past Medical History:  Diagnosis Date  . Arthritis   . Bradycardia 2014  . Calcification of bronchial airway 06/23/2018   01/2017 - See on CT imaging of the chest   . Cataract   . Cerebral vascular disease 06/19/2018   06/19/2018 - MRI with chronic microvascular ischemic change  . Diverticulosis   . Dyspnea    periodically  . Dysrhythmia    a - fib  . Heart murmur    ??? bruit  . History of stress test    a. 04/09/13-nuclear stress-no ischemia low risk  . Hypertension   . Menopause   . Osteoporosis   . PAF (paroxysmal atrial fibrillation) ([place])    a. s/p prior DCCV; b. On flecainide & coumadin (CHA2DS2VASc = 4);  c. 03/2016 Echo: EF 55-60%, mod LVH, mod AI, mild to mod TR, PASP 64mmHg.  Marland Kitchen Pelvic fracture ([place])   . Pulmonary fibrosis ([place]) 2018  . Rheumatoid arthritis ([place]) 2017    Past Surgical History:  Procedure Laterality Date  . BIOPSY BREAST    . BIOPSY BREAST     right & benign  . BREAST SURGERY    . CARDIOVERSION N/A 10/30/2016  Procedure: CARDIOVERSION;  Surgeon: Larey Dresser, MD;  Location: [place];  Service: Cardiovascular;  Laterality: N/A;  . CARDIOVERSION N/A 08/20/2017   Procedure: CARDIOVERSION;  Surgeon: Jerline Pain, MD;  Location: [facility] ENDOSCOPY;  Service: Cardiovascular;  Laterality: N/A;  . CATARACT EXTRACTION    . COLONOSCOPY    . EYE SURGERY     cataracts  . TEE WITHOUT CARDIOVERSION N/A 10/30/2016   Procedure: TRANSESOPHAGEAL ECHOCARDIOGRAM (TEE);  Surgeon: Larey Dresser, MD;  Location: [place];  Service: Cardiovascular;  Laterality: N/A;  . TOTAL HIP ARTHROPLASTY Right 01/27/2017   Procedure: TOTAL HIP ARTHROPLASTY ANTERIOR APPROACH;  Surgeon: Rod Can, MD;  Location: [place];  Service: Orthopedics;  Laterality: Right;     Current Outpatient  Medications  Medication Sig Dispense Refill  . ALPRAZolam (XANAX) 0.25 MG tablet Take 1/2 hour prior to Pulmonary Function Test. 1 tablet 0  . apixaban (ELIQUIS) 2.5 MG TABS tablet Take 1 tablet (2.5 mg total) by mouth 2 (two) times daily. 180 tablet 3  . azaTHIOprine (IMURAN) 50 MG tablet Take 50 mg by mouth daily.     . calcium-vitamin D (OSCAL WITH D) 250-125 MG-UNIT per tablet Take 1 tablet by mouth 2 (two) times daily.    . Cyanocobalamin (VITAMIN B-12 IJ) Inject once a month    . diltiazem (CARDIZEM CD) 300 MG 24 hr capsule Take 1 capsule (300 mg total) by mouth daily. 90 capsule 3  . ESBRIET 267 MG TABS 267 mg. Take three tablets by mouth three times daily    . folic acid (FOLVITE) 1 MG tablet Take 1 mg by mouth daily.     . furosemide (LASIX) 20 MG tablet TAKE 1 TO 2 TABLETS BY  MOUTH DAILY AS NEEDED 180 tablet 0  . gabapentin (NEURONTIN) 100 MG capsule Take 100 mg by mouth at bedtime.     Marland Kitchen levothyroxine (SYNTHROID, LEVOTHROID) 75 MCG tablet TAKE 1 TABLET BY MOUTH  DAILY 90 tablet 3  . lisinopril (ZESTRIL) 20 MG tablet TAKE 1 TABLET BY MOUTH  DAILY 90 tablet 3  . lisinopril-hydrochlorothiazide (ZESTORETIC) 20-12.5 MG tablet TAKE 1 TABLET BY MOUTH ONCE DAILY IN THE MORNING 90 tablet 3  . Melatonin 5 MG TABS Take 5 mg by mouth at bedtime.    . Multiple Vitamin (MULTIVITAMIN) capsule Take 1 capsule by mouth daily.    . ondansetron (ZOFRAN-ODT) 4 MG disintegrating tablet DISSOLVE 1 TABLET IN MOUTH EVERY 8 HOURS AS NEEDED FOR NAUSEA AND VOMITING 30 tablet 0  . potassium chloride SA (KLOR-CON) 20 MEQ tablet TAKE 1 TABLET BY MOUTH  DAILY 90 tablet 0  . Probiotic Product (PROBIOTIC-10 ULTIMATE PO) Take 1 tablet by mouth daily.     Current Facility-Administered Medications  Medication Dose Route Frequency Provider Last Rate Last Admin  . cyanocobalamin ((VITAMIN B-12)) injection 1,000 mcg  1,000 mcg Intramuscular Q30 days Chipper Herb, MD   1,000 mcg at 01/03/20 1045    Allergies:    Benazepril hcl    ROS:  Please see the history of present illness.   Otherwise, review of systems are positive for none.   All other systems are reviewed and negative.    PHYSICAL EXAM: VS:  BP 130/80   Pulse (!) 104   Ht 5' (1.524 m)   Wt 121 lb (54.9 kg)   BMI 23.63 kg/m  , BMI Body mass index is 23.63 kg/m. GENERAL:  Well appearing NECK:  No jugular venous distention, waveform within normal limits, carotid  upstroke brisk and symmetric, no bruits, no thyromegaly LUNGS:  Clear to auscultation bilaterally CHEST:  Unremarkable HEART:  PMI not displaced or sustained,S1 and S2 within normal limits, no S3, no clicks, no rubs, no murmurs ABD:  Flat, positive bowel sounds normal in frequency in pitch, no bruits, no rebound, no guarding, no midline pulsatile mass, no hepatomegaly, no splenomegaly EXT:  2 plus pulses throughout, no edema, no cyanosis no clubbing   EKG:  EKG is not ordered today.    Recent Labs: 07/02/2019: TSH 1.340 11/22/2019: ALT 8; BUN 25; Creatinine, Ser 1.14; Hemoglobin 12.4; Platelets 155; Potassium 3.8; Sodium 142    Lipid Panel    Component Value Date/Time   CHOL 153 04/20/2019 0828   CHOL 131 04/14/2013 0836   TRIG 66 04/20/2019 0828   TRIG 67 09/15/2017 0845   TRIG 69 04/14/2013 0836   HDL 71 04/20/2019 0828   HDL 71 09/15/2017 0845   HDL 53 04/14/2013 0836   CHOLHDL 2.2 04/20/2019 0828   LDLCALC 69 04/20/2019 0828   LDLCALC 64 04/14/2013 0836   LDLDIRECT 98 04/20/2014 1015      Wt Readings from Last 3 Encounters:  01/26/20 121 lb (54.9 kg)  12/22/19 119 lb (54 kg)  12/06/19 117 lb (53.1 kg)      Other studies Reviewed: Additional studies/ records that were reviewed today include: None. Review of the above records demonstrates:  Please see elsewhere in the note.     ASSESSMENT AND PLAN:  HTN:    Her BP is a little labile but otherwise typically controlled.  No change in therapy.  ATRIAL FIB:   Susan Fitzgerald has a CHA2DS2 - VASc  score of 4.   I think it is prudent to try cardioversion.  She is not going to tolerate antiarrhythmics at this point and failed a couple.  Is not clear that she would feel less breathless in sinus periods reasonable to try.  She understands the low percentage that she will stay in sinus rhythm.  However, she wants to proceed.  She is tolerating her anticoagulation and will continue.   DYSPNEA:    This is as above.   COVID EDUCATION: She is getting her second dose.    Current medicines are reviewed at length with the patient today.  The patient does not have concerns regarding medicines.  The following changes have been made:  no change  Labs/ tests ordered today include:   No orders of the defined types were placed in this encounter.    Disposition:   FU with me in 3 months.     Signed, Minus Breeding, MD  01/26/2020 3:43 PM    [facility]

## 2020-01-25 NOTE — H&P (View-Only) (Signed)
Cardiology Office Note   Date:  01/26/2020   ID:  Susan, Fitzgerald 10-06-1932, MRN [id number]  PCP:  [name], DO  Cardiologist:   Minus Breeding, MD   No chief complaint on file.     History of Present Illness: Susan Fitzgerald is a 86 y.o. female who presents for evaluation of atrial fibrillation. She was cardioverted. However, she developed recurrent atrial fib. She had her flecainide does reduced because of a prolonged QT interval. The flecainide was subsequently discontinued as she had reverted to atrial fib. She has been followed in the atrial fib clinic. She did describe chest pain and was sent for [facility] which did not suggest ischemia. She does have a mildly reduced EF (40 - 45% on echo.) Her last echo in March 2019 demonstrated an improved EF of 55 - 60%. She was started on amiodarone and underwent DCCV. She had increased dyspnea and decreased O2 sats. She was treated for pneumonia. She was referred to pulmonary. A CT was done. There was a thought that she might have an amiodarone pulmonary process. Other processes could not be excluded such as a reaction to her methotrexate. There was also suggestion of pulmonary edema. She has been treated with diuresis. She did have an increased creat but this is improved. She was treated with steroids and did improve.She has not been told that she has idiopathic idiopathicpulmonary fibrosis.  She wore a Holter.   Her rate is elevated and she is 100% atrial fibrillation.    She had nosebleeds and had to hold the Eliquis.  She saw ENT.  She had no acute bleeding and was managed conservatively.   Since I last saw her she has not had any further nosebleeds.  She has been doing.  She has been doing good nares care.  He does still have breathlessness which he thinks is a little worse with her fibrillation.  She knows her heart is out of rhythm but she is not seeing any real rapid rates.  Today's heart  rate is actually higher than previous.  She is not having any presyncope or syncope.  She is not having any chest pressure, neck or arm discomfort.  She has had no weight gain or edema.   Past Medical History:  Diagnosis Date  . Arthritis   . Bradycardia 2014  . Calcification of bronchial airway 06/23/2018   01/2017 - See on CT imaging of the chest   . Cataract   . Cerebral vascular disease 06/19/2018   06/19/2018 - MRI with chronic microvascular ischemic change  . Diverticulosis   . Dyspnea    periodically  . Dysrhythmia    a - fib  . Heart murmur    ??? bruit  . History of stress test    a. 04/09/13-nuclear stress-no ischemia low risk  . Hypertension   . Menopause   . Osteoporosis   . PAF (paroxysmal atrial fibrillation) ([place])    a. s/p prior DCCV; b. On flecainide & coumadin (CHA2DS2VASc = 4);  c. 03/2016 Echo: EF 55-60%, mod LVH, mod AI, mild to mod TR, PASP 22mmHg.  Marland Kitchen Pelvic fracture ([place])   . Pulmonary fibrosis ([place]) 2018  . Rheumatoid arthritis ([place]) 2017    Past Surgical History:  Procedure Laterality Date  . BIOPSY BREAST    . BIOPSY BREAST     right & benign  . BREAST SURGERY    . CARDIOVERSION N/A 10/30/2016  Procedure: CARDIOVERSION;  Surgeon: Larey Dresser, MD;  Location: [place];  Service: Cardiovascular;  Laterality: N/A;  . CARDIOVERSION N/A 08/20/2017   Procedure: CARDIOVERSION;  Surgeon: Jerline Pain, MD;  Location[facility] ENDOSCOPY;  Service: Cardiovascular;  Laterality: N/A;  . CATARACT EXTRACTION    . COLONOSCOPY    . EYE SURGERY     cataracts  . TEE WITHOUT CARDIOVERSION N/A 10/30/2016   Procedure: TRANSESOPHAGEAL ECHOCARDIOGRAM (TEE);  Surgeon: Larey Dresser, MD;  Location: [facility];  Service: Cardiovascular;  Laterality: N/A;  . TOTAL HIP ARTHROPLASTY Right 01/27/2017   Procedure: TOTAL HIP ARTHROPLASTY ANTERIOR APPROACH;  Surgeon: Rod Can, MD;  Location: [place];  Service: Orthopedics;  Laterality: Right;     Current Outpatient  Medications  Medication Sig Dispense Refill  . ALPRAZolam (XANAX) 0.25 MG tablet Take 1/2 hour prior to Pulmonary Function Test. 1 tablet 0  . apixaban (ELIQUIS) 2.5 MG TABS tablet Take 1 tablet (2.5 mg total) by mouth 2 (two) times daily. 180 tablet 3  . azaTHIOprine (IMURAN) 50 MG tablet Take 50 mg by mouth daily.     . calcium-vitamin D (OSCAL WITH D) 250-125 MG-UNIT per tablet Take 1 tablet by mouth 2 (two) times daily.    . Cyanocobalamin (VITAMIN B-12 IJ) Inject once a month    . diltiazem (CARDIZEM CD) 300 MG 24 hr capsule Take 1 capsule (300 mg total) by mouth daily. 90 capsule 3  . ESBRIET 267 MG TABS 267 mg. Take three tablets by mouth three times daily    . folic acid (FOLVITE) 1 MG tablet Take 1 mg by mouth daily.     . furosemide (LASIX) 20 MG tablet TAKE 1 TO 2 TABLETS BY  MOUTH DAILY AS NEEDED 180 tablet 0  . gabapentin (NEURONTIN) 100 MG capsule Take 100 mg by mouth at bedtime.     Marland Kitchen levothyroxine (SYNTHROID, LEVOTHROID) 75 MCG tablet TAKE 1 TABLET BY MOUTH  DAILY 90 tablet 3  . lisinopril (ZESTRIL) 20 MG tablet TAKE 1 TABLET BY MOUTH  DAILY 90 tablet 3  . lisinopril-hydrochlorothiazide (ZESTORETIC) 20-12.5 MG tablet TAKE 1 TABLET BY MOUTH ONCE DAILY IN THE MORNING 90 tablet 3  . Melatonin 5 MG TABS Take 5 mg by mouth at bedtime.    . Multiple Vitamin (MULTIVITAMIN) capsule Take 1 capsule by mouth daily.    . ondansetron (ZOFRAN-ODT) 4 MG disintegrating tablet DISSOLVE 1 TABLET IN MOUTH EVERY 8 HOURS AS NEEDED FOR NAUSEA AND VOMITING 30 tablet 0  . potassium chloride SA (KLOR-CON) 20 MEQ tablet TAKE 1 TABLET BY MOUTH  DAILY 90 tablet 0  . Probiotic Product (PROBIOTIC-10 ULTIMATE PO) Take 1 tablet by mouth daily.     Current Facility-Administered Medications  Medication Dose Route Frequency Provider Last Rate Last Admin  . cyanocobalamin ((VITAMIN B-12)) injection 1,000 mcg  1,000 mcg Intramuscular Q30 days Chipper Herb, MD   1,000 mcg at 01/03/20 1045    Allergies:    Benazepril hcl    ROS:  Please see the history of present illness.   Otherwise, review of systems are positive for none.   All other systems are reviewed and negative.    PHYSICAL EXAM: VS:  BP 130/80   Pulse (!) 104   Ht 5' (1.524 m)   Wt 121 lb (54.9 kg)   BMI 23.63 kg/m  , BMI Body mass index is 23.63 kg/m. GENERAL:  Well appearing NECK:  No jugular venous distention, waveform within normal limits, carotid  upstroke brisk and symmetric, no bruits, no thyromegaly LUNGS:  Clear to auscultation bilaterally CHEST:  Unremarkable HEART:  PMI not displaced or sustained,S1 and S2 within normal limits, no S3, no clicks, no rubs, no murmurs ABD:  Flat, positive bowel sounds normal in frequency in pitch, no bruits, no rebound, no guarding, no midline pulsatile mass, no hepatomegaly, no splenomegaly EXT:  2 plus pulses throughout, no edema, no cyanosis no clubbing   EKG:  EKG is not ordered today.    Recent Labs: 07/02/2019: TSH 1.340 11/22/2019: ALT 8; BUN 25; Creatinine, Ser 1.14; Hemoglobin 12.4; Platelets 155; Potassium 3.8; Sodium 142    Lipid Panel    Component Value Date/Time   CHOL 153 04/20/2019 0828   CHOL 131 04/14/2013 0836   TRIG 66 04/20/2019 0828   TRIG 67 09/15/2017 0845   TRIG 69 04/14/2013 0836   HDL 71 04/20/2019 0828   HDL 71 09/15/2017 0845   HDL 53 04/14/2013 0836   CHOLHDL 2.2 04/20/2019 0828   LDLCALC 69 04/20/2019 0828   LDLCALC 64 04/14/2013 0836   LDLDIRECT 98 04/20/2014 1015      Wt Readings from Last 3 Encounters:  01/26/20 121 lb (54.9 kg)  12/22/19 119 lb (54 kg)  12/06/19 117 lb (53.1 kg)      Other studies Reviewed: Additional studies/ records that were reviewed today include: None. Review of the above records demonstrates:  Please see elsewhere in the note.     ASSESSMENT AND PLAN:  HTN:    Her BP is a little labile but otherwise typically controlled.  No change in therapy.  ATRIAL FIB:   Susan Fitzgerald has a CHA2DS2 - VASc  score of 4.   I think it is prudent to try cardioversion.  She is not going to tolerate antiarrhythmics at this point and failed a couple.  Is not clear that she would feel less breathless in sinus periods reasonable to try.  She understands the low percentage that she will stay in sinus rhythm.  However, she wants to proceed.  She is tolerating her anticoagulation and will continue.   DYSPNEA:    This is as above.   COVID EDUCATION: She is getting her second dose.    Current medicines are reviewed at length with the patient today.  The patient does not have concerns regarding medicines.  The following changes have been made:  no change  Labs/ tests ordered today include:   No orders of the defined types were placed in this encounter.    Disposition:   FU with me in 3 months.     Signed, Minus Breeding, MD  01/26/2020 3:43 PM     [facility]

## 2020-01-26 ENCOUNTER — Encounter: Payer: Self-pay | Admitting: Cardiology

## 2020-01-26 ENCOUNTER — Other Ambulatory Visit: Payer: Self-pay

## 2020-01-26 ENCOUNTER — Ambulatory Visit: Payer: Medicare Other | Admitting: Cardiology

## 2020-01-26 ENCOUNTER — Encounter: Payer: Self-pay | Admitting: *Deleted

## 2020-01-26 VITALS — BP 130/80 | HR 104 | Ht 60.0 in | Wt 121.0 lb

## 2020-01-26 DIAGNOSIS — I10 Essential (primary) hypertension: Secondary | ICD-10-CM | POA: Diagnosis not present

## 2020-01-26 DIAGNOSIS — I4819 Other persistent atrial fibrillation: Secondary | ICD-10-CM

## 2020-01-26 NOTE — Patient Instructions (Signed)
Medication Instructions:  The current medical regimen is effective;  continue present plan and medications.  *If you need a refill on your cardiac medications before your next appointment, please call your pharmacy*  Testing/Procedures: Your physician has requested that you have a Cardioversion.  Electrical Cardioversion uses a jolt of electricity to your heart either through paddles or wired patches attached to your chest. This is a controlled, usually prescheduled, procedure. This procedure is done at the hospital and you are not awake during the procedure. You usually go home the day of the procedure. Please see the instruction sheet given to you today for more information.  Follow-Up: At [facility], you and your health needs are our priority.  As part of our continuing mission to provide you with exceptional heart care, we have created designated Provider Care Teams.  These Care Teams include your primary Cardiologist (physician) and Advanced Practice Providers (APPs -  Physician Assistants and Nurse Practitioners) who all work together to provide you with the care you need, when you need it.  We recommend signing up for the patient portal called "MyChart".  Sign up information is provided on this After Visit Summary.  MyChart is used to connect with patients for Virtual Visits (Telemedicine).  Patients are able to view lab/test results, encounter notes, upcoming appointments, etc.  Non-urgent messages can be sent to your provider as well.   To learn more about what you can do with MyChart, go to [email or web address].    Your next appointment:   After your cardioversion  The format for your next appointment:   In Person  Provider:   Minus Breeding, MD  Thank you for choosing [facility]!!

## 2020-01-31 ENCOUNTER — Other Ambulatory Visit: Payer: Self-pay

## 2020-01-31 ENCOUNTER — Other Ambulatory Visit (HOSPITAL_COMMUNITY)
Admission: RE | Admit: 2020-01-31 | Discharge: 2020-01-31 | Disposition: A | Discharge status: Home / Self Care | Payer: Medicare Other | Source: Ambulatory Visit | Attending: Cardiovascular Disease | Admitting: Cardiovascular Disease

## 2020-01-31 DIAGNOSIS — Z20822 Contact with and (suspected) exposure to covid-19: Secondary | ICD-10-CM | POA: Diagnosis not present

## 2020-01-31 DIAGNOSIS — Z01812 Encounter for preprocedural laboratory examination: Secondary | ICD-10-CM | POA: Insufficient documentation

## 2020-02-01 LAB — SARS CORONAVIRUS 2 (TAT 6-24 HRS): SARS Coronavirus 2: NEGATIVE

## 2020-02-02 ENCOUNTER — Ambulatory Visit (HOSPITAL_COMMUNITY)
Admission: RE | Admit: 2020-02-02 | Discharge: 2020-02-02 | Disposition: A | Discharge status: Home / Self Care | Payer: Medicare Other | Attending: Cardiovascular Disease | Admitting: Cardiovascular Disease

## 2020-02-02 ENCOUNTER — Encounter (HOSPITAL_COMMUNITY)
Admission: RE | Disposition: A | Discharge status: Home / Self Care | Payer: Self-pay | Source: Home / Self Care | Attending: Cardiovascular Disease

## 2020-02-02 ENCOUNTER — Ambulatory Visit (HOSPITAL_COMMUNITY): Payer: Medicare Other | Admitting: Certified Registered Nurse Anesthetist

## 2020-02-02 ENCOUNTER — Encounter: Payer: Self-pay | Admitting: *Deleted

## 2020-02-02 ENCOUNTER — Other Ambulatory Visit: Payer: Self-pay

## 2020-02-02 ENCOUNTER — Encounter (HOSPITAL_COMMUNITY): Payer: Self-pay | Admitting: Cardiovascular Disease

## 2020-02-02 DIAGNOSIS — Z79890 Hormone replacement therapy (postmenopausal): Secondary | ICD-10-CM | POA: Diagnosis not present

## 2020-02-02 DIAGNOSIS — M810 Age-related osteoporosis without current pathological fracture: Secondary | ICD-10-CM | POA: Insufficient documentation

## 2020-02-02 DIAGNOSIS — H269 Unspecified cataract: Secondary | ICD-10-CM | POA: Insufficient documentation

## 2020-02-02 DIAGNOSIS — I679 Cerebrovascular disease, unspecified: Secondary | ICD-10-CM | POA: Diagnosis not present

## 2020-02-02 DIAGNOSIS — I10 Essential (primary) hypertension: Secondary | ICD-10-CM | POA: Diagnosis not present

## 2020-02-02 DIAGNOSIS — I480 Paroxysmal atrial fibrillation: Secondary | ICD-10-CM | POA: Insufficient documentation

## 2020-02-02 DIAGNOSIS — J8410 Pulmonary fibrosis, unspecified: Secondary | ICD-10-CM | POA: Diagnosis not present

## 2020-02-02 DIAGNOSIS — M069 Rheumatoid arthritis, unspecified: Secondary | ICD-10-CM | POA: Diagnosis not present

## 2020-02-02 DIAGNOSIS — Z96641 Presence of right artificial hip joint: Secondary | ICD-10-CM | POA: Insufficient documentation

## 2020-02-02 DIAGNOSIS — I4891 Unspecified atrial fibrillation: Secondary | ICD-10-CM | POA: Diagnosis not present

## 2020-02-02 DIAGNOSIS — Z79899 Other long term (current) drug therapy: Secondary | ICD-10-CM | POA: Insufficient documentation

## 2020-02-02 DIAGNOSIS — E039 Hypothyroidism, unspecified: Secondary | ICD-10-CM | POA: Diagnosis not present

## 2020-02-02 DIAGNOSIS — I4819 Other persistent atrial fibrillation: Secondary | ICD-10-CM | POA: Diagnosis not present

## 2020-02-02 DIAGNOSIS — Z7901 Long term (current) use of anticoagulants: Secondary | ICD-10-CM | POA: Insufficient documentation

## 2020-02-02 HISTORY — PX: CARDIOVERSION: SHX1299

## 2020-02-02 LAB — POCT I-STAT, CHEM 8
BUN: 29 mg/dL — ABNORMAL HIGH (ref 8–23)
Calcium, Ion: 1.21 mmol/L (ref 1.15–1.40)
Chloride: 102 mmol/L (ref 98–111)
Creatinine, Ser: 1.1 mg/dL — ABNORMAL HIGH (ref 0.44–1.00)
Glucose, Bld: 80 mg/dL (ref 70–99)
HCT: 39 % (ref 36.0–46.0)
Hemoglobin: 13.3 g/dL (ref 12.0–15.0)
Potassium: 3.8 mmol/L (ref 3.5–5.1)
Sodium: 141 mmol/L (ref 135–145)
TCO2: 34 mmol/L — ABNORMAL HIGH (ref 22–32)

## 2020-02-02 SURGERY — CARDIOVERSION
Anesthesia: General

## 2020-02-02 MED ORDER — SODIUM CHLORIDE 0.9 % IV SOLN: INTRAVENOUS | Status: DC | PRN

## 2020-02-02 MED ORDER — LIDOCAINE 2% (20 MG/ML) 5 ML SYRINGE
INTRAMUSCULAR | Status: DC | PRN
  Administered 2020-02-02: 60 mg via INTRAVENOUS

## 2020-02-02 MED ORDER — PROPOFOL 10 MG/ML IV BOLUS
INTRAVENOUS | Status: DC | PRN
  Administered 2020-02-02: 50 mg via INTRAVENOUS

## 2020-02-02 NOTE — Anesthesia Procedure Notes (Signed)
Procedure Name: General with mask airway Date/Time: 02/02/2020 11:34 AM Performed by: Janene Harvey, CRNA Pre-anesthesia Checklist: Patient identified, Emergency Drugs available, Suction available and Patient being monitored Oxygen Delivery Method: Ambu bag Dental Injury: Teeth and Oropharynx as per pre-operative assessment

## 2020-02-02 NOTE — Anesthesia Preprocedure Evaluation (Addendum)
Anesthesia Evaluation  Patient identified by MRN, date of birth, ID band Patient awake    Reviewed: Allergy & Precautions, NPO status , Patient's Chart, lab work & pertinent test results  History of Anesthesia Complications Negative for: history of anesthetic complications  Airway Mallampati: II  TM Distance: >3 FB Neck ROM: Full    Dental  (+) Dental Advisory Given, Teeth Intact   Pulmonary   Pulmonary fibrosis    Pulmonary exam normal        Cardiovascular hypertension, Pt. on medications + dysrhythmias Atrial Fibrillation  Rhythm:Irregular Rate:Tachycardia   '19 TTE - mild LVH. Mild focal basal hypertrophy of the septum. EF 55% to 60%. Grade 1 diastolic dysfunction. Mild AI. The ascending aorta was mildly dilated. PASP was mildly increased    Neuro/Psych negative neurological ROS  negative psych ROS   GI/Hepatic negative GI ROS, Neg liver ROS,   Endo/Other  Hypothyroidism   Renal/GU negative Renal ROS     Musculoskeletal  (+) Arthritis , Rheumatoid disorders,    Abdominal   Peds  Hematology  On eliqiuis    Anesthesia Other Findings Covid neg 01/31/20   Reproductive/Obstetrics                            Anesthesia Physical Anesthesia Plan  ASA: III  Anesthesia Plan: General   Post-op Pain Management:    Induction: Intravenous  PONV Risk Score and Plan: 3 and Treatment may vary due to age or medical condition and Propofol infusion  Airway Management Planned: Mask and Natural Airway  Additional Equipment: None  Intra-op Plan:   Post-operative Plan:   Informed Consent: I have reviewed the patients History and Physical, chart, labs and discussed the procedure including the risks, benefits and alternatives for the proposed anesthesia with the patient or authorized representative who has indicated his/her understanding and acceptance.       Plan Discussed with: CRNA  and Anesthesiologist  Anesthesia Plan Comments:        Anesthesia Quick Evaluation

## 2020-02-02 NOTE — CV Procedure (Signed)
   DIRECT CURRENT CARDIOVERSION  NAME:  Susan Fitzgerald    MRN: [id number] DOB:  1932/10/29    ADMIT DATE: 02/02/2020  Indication:  Symptomatic atrial fibrillation  Procedure Note:  The patient signed informed consent.  They have had had therapeutic anticoagulation with eliquis greater than 3 weeks.  Anesthesia was administered by Dr. Fransisco Beau.  Adequate airway was maintained throughout and vital followed per protocol.  They were cardioverted x 1 with 200J of biphasic synchronized energy.  They converted to NSR.  There were no apparent complications.  The patient had normal neuro status and respiratory status post procedure with vitals stable as recorded elsewhere.    Follow up: They will continue on current medical therapy and follow up with cardiology as scheduled.  [place] T. Audie Box, [place]  [address], [place], [place] [address] [phone]  11:42 AM

## 2020-02-02 NOTE — Interval H&P Note (Signed)
History and Physical Interval Note:  02/02/2020 10:52 AM  Susan Fitzgerald  has presented today for surgery, with the diagnosis of AFIB.  The various methods of treatment have been discussed with the patient and family. After consideration of risks, benefits and other options for treatment, the patient has consented to  Procedure(s): CARDIOVERSION (N/A) as a surgical intervention.  The patient's history has been reviewed, patient examined, no change in status, stable for surgery.  I have reviewed the patient's chart and labs.  Questions were answered to the patient's satisfaction.    Symptomatic afib. No missed doses of eliquis. DCCV today.  [place] T. Audie Box, [place]  [address], [place], [place] [address] [phone]  10:53 AM

## 2020-02-02 NOTE — Transfer of Care (Signed)
Immediate Anesthesia Transfer of Care Note  Patient: Susan Fitzgerald  Procedure(s) Performed: CARDIOVERSION (N/A )  Patient Location: Endoscopy Unit  Anesthesia Type:General  Level of Consciousness: drowsy  Airway & Oxygen Therapy: Patient Spontanous Breathing and Patient connected to nasal cannula oxygen  Post-op Assessment: Report given to RN and Post -op Vital signs reviewed and stable  Post vital signs: Reviewed  Last Vitals:  Vitals Value Taken Time  BP    Temp    Pulse    Resp    SpO2      Last Pain:  Vitals:   02/02/20 1057  TempSrc: Oral  PainSc: 0-No pain         Complications: No apparent anesthesia complications

## 2020-02-02 NOTE — Anesthesia Postprocedure Evaluation (Signed)
Anesthesia Post Note  Patient: JAYLEI FUERTE  Procedure(s) Performed: CARDIOVERSION (N/A )     Patient location during evaluation: PACU Anesthesia Type: General Level of consciousness: awake and alert Pain management: pain level controlled Vital Signs Assessment: post-procedure vital signs reviewed and stable Respiratory status: spontaneous breathing, nonlabored ventilation and respiratory function stable Cardiovascular status: blood pressure returned to baseline and stable Postop Assessment: no apparent nausea or vomiting Anesthetic complications: no    Last Vitals:  Vitals:   02/02/20 1152 02/02/20 1155  BP: 137/76 140/63  Pulse: 63 68  Resp: 17 (!) 24  Temp:    SpO2: 99% 97%    Last Pain:  Vitals:   02/02/20 1155  TempSrc:   PainSc: 0-No pain                 Audry Pili

## 2020-02-14 ENCOUNTER — Ambulatory Visit (INDEPENDENT_AMBULATORY_CARE_PROVIDER_SITE_OTHER): Payer: Medicare Other

## 2020-02-14 ENCOUNTER — Other Ambulatory Visit: Payer: Self-pay

## 2020-02-14 DIAGNOSIS — E538 Deficiency of other specified B group vitamins: Secondary | ICD-10-CM

## 2020-02-14 MED ORDER — CYANOCOBALAMIN 1000 MCG/ML IJ SOLN
1000.0000 ug | INTRAMUSCULAR | Status: DC
  Administered 2020-02-14 – 2021-03-23 (×7): 1000 ug via INTRAMUSCULAR

## 2020-02-14 NOTE — Progress Notes (Signed)
Cyanocobalamin injection given to left deltoid.  Patient tolerated well. 

## 2020-02-16 ENCOUNTER — Other Ambulatory Visit: Payer: Self-pay | Admitting: *Deleted

## 2020-02-16 MED ORDER — LEVOTHYROXINE SODIUM 75 MCG PO TABS: 75.0000 ug | ORAL_TABLET | Freq: Every day | ORAL | 1 refills | Status: DC

## 2020-03-24 ENCOUNTER — Other Ambulatory Visit (HOSPITAL_COMMUNITY): Payer: Medicare Other

## 2020-03-28 ENCOUNTER — Telehealth: Payer: Self-pay | Admitting: Pulmonary Disease

## 2020-03-28 ENCOUNTER — Other Ambulatory Visit: Payer: Self-pay | Admitting: Pulmonary Disease

## 2020-03-28 ENCOUNTER — Ambulatory Visit: Payer: Medicare Other | Admitting: Pulmonary Disease

## 2020-03-28 MED ORDER — ESBRIET 267 MG PO TABS: 801.0000 mg | ORAL_TABLET | Freq: Three times a day (TID) | ORAL | 0 refills | Status: DC

## 2020-03-28 NOTE — Telephone Encounter (Signed)
PM refilled Esbriet#90 Pt notified and follow has been scheduled. Nothing further needed.

## 2020-03-28 NOTE — Telephone Encounter (Signed)
Last office visit was 11/03/19 Pt requesting refill on Esbriet 3 tabs 3 times per day. Called pt to make aware of plan below. She just received her last refill of 30 days. How would you like to proceed?   Assessment and Plan: RA ILD Continue Esbriet.  Increase to full dose of 3 tablets 3 times daily Zofran as needed for nausea Check CMP, CBC for monitoring  Follow Up Instructions: Follow-up in 4-week

## 2020-03-29 NOTE — Telephone Encounter (Signed)
Error

## 2020-03-31 ENCOUNTER — Other Ambulatory Visit: Payer: Self-pay

## 2020-03-31 ENCOUNTER — Ambulatory Visit (INDEPENDENT_AMBULATORY_CARE_PROVIDER_SITE_OTHER): Payer: Medicare Other | Admitting: *Deleted

## 2020-03-31 DIAGNOSIS — E538 Deficiency of other specified B group vitamins: Secondary | ICD-10-CM | POA: Diagnosis not present

## 2020-03-31 NOTE — Progress Notes (Signed)
Patient in today for B 12 injection. 1000 mcg given IM in right deltoid. Patient tolerated well.  

## 2020-04-03 NOTE — Telephone Encounter (Signed)
Please order hepatic panel and follow up visit at next available with me or APP

## 2020-04-03 NOTE — Telephone Encounter (Signed)
Prescription called in for 90 tablets vs. 270 tablets which would equal a 30 day supply.  Received a fax from [facility] requesting prescription clarification for quantity and directions.  Gave verbal clarification to pharmacist Estill Bamberg for Esbriet 267 mg 3 tablets 3 times daily with meals for a quantity of 270 tablets with 2 refills.   She is overdue for labs.  Last  visit was televisit on 11/03/2019 with follow up in 4 weeks.    Recommend follow up appointment with labs.    Mariella Saa, PharmD, [place], [place] [facility] Pharmacist [phone]  04/03/2020 3:04 PM

## 2020-04-04 ENCOUNTER — Telehealth: Payer: Self-pay | Admitting: Pulmonary Disease

## 2020-04-04 DIAGNOSIS — Z5181 Encounter for therapeutic drug level monitoring: Secondary | ICD-10-CM

## 2020-04-04 DIAGNOSIS — J849 Interstitial pulmonary disease, unspecified: Secondary | ICD-10-CM

## 2020-04-04 NOTE — Telephone Encounter (Signed)
Yes. She will need CMP to monitor while she is on esbriet

## 2020-04-04 NOTE — Telephone Encounter (Signed)
[place] and spoke with Amy, pharmacist in regards to call from pt about her Esbriet needing clarified. Per Amy, [employer] [facility] had already called and given a verbal on 5/11 about the quantity.  Called and spoke with pt letting her know that her Rx has been taken care of and she verbalized understanding. Pt wanted to know if she was due for labwork due to being on the [place]. Dr. [name] Browner, please advise on this and please advise what labs you want to have done if she is needing labwork.

## 2020-04-04 NOTE — Telephone Encounter (Signed)
Order has been placed for the CMP. Called and spoke with pt letting her know this had been done so she can go to PCP for labwork. Pt verbalized understanding. Nothing further needed.

## 2020-04-07 ENCOUNTER — Telehealth: Payer: Self-pay | Admitting: Family Medicine

## 2020-04-07 ENCOUNTER — Other Ambulatory Visit: Payer: Self-pay | Admitting: *Deleted

## 2020-04-07 DIAGNOSIS — E039 Hypothyroidism, unspecified: Secondary | ICD-10-CM

## 2020-04-07 NOTE — Telephone Encounter (Signed)
Aware, future order for thyroid and tsh ordered.

## 2020-04-07 NOTE — Telephone Encounter (Signed)
Order more labs?

## 2020-04-07 NOTE — Telephone Encounter (Signed)
Thyroid panel with TSH for me please.

## 2020-04-10 ENCOUNTER — Other Ambulatory Visit: Payer: Self-pay

## 2020-04-10 ENCOUNTER — Other Ambulatory Visit: Payer: Medicare Other

## 2020-04-10 DIAGNOSIS — M0579 Rheumatoid arthritis with rheumatoid factor of multiple sites without organ or systems involvement: Secondary | ICD-10-CM | POA: Diagnosis not present

## 2020-04-10 DIAGNOSIS — E039 Hypothyroidism, unspecified: Secondary | ICD-10-CM | POA: Diagnosis not present

## 2020-04-10 DIAGNOSIS — Z79899 Other long term (current) drug therapy: Secondary | ICD-10-CM | POA: Diagnosis not present

## 2020-04-11 LAB — THYROID PANEL WITH TSH
Free Thyroxine Index: 2.9 (ref 1.2–4.9)
T3 Uptake Ratio: 30 % (ref 24–39)
T4, Total: 9.6 ug/dL (ref 4.5–12.0)
TSH: 1.1 u[IU]/mL (ref 0.450–4.500)

## 2020-04-16 ENCOUNTER — Other Ambulatory Visit: Payer: Self-pay | Admitting: Family Medicine

## 2020-04-19 DIAGNOSIS — M150 Primary generalized (osteo)arthritis: Secondary | ICD-10-CM | POA: Diagnosis not present

## 2020-04-19 DIAGNOSIS — M0579 Rheumatoid arthritis with rheumatoid factor of multiple sites without organ or systems involvement: Secondary | ICD-10-CM | POA: Diagnosis not present

## 2020-04-19 DIAGNOSIS — R7989 Other specified abnormal findings of blood chemistry: Secondary | ICD-10-CM | POA: Diagnosis not present

## 2020-04-19 DIAGNOSIS — M5030 Other cervical disc degeneration, unspecified cervical region: Secondary | ICD-10-CM | POA: Diagnosis not present

## 2020-04-19 DIAGNOSIS — M5136 Other intervertebral disc degeneration, lumbar region: Secondary | ICD-10-CM | POA: Diagnosis not present

## 2020-05-02 NOTE — Progress Notes (Signed)
Cardiology Office Note   Date:  05/03/2020   ID:  Susan Fitzgerald, DOB 1932-02-16, MRN [id number]  PCP:  [name], DO  Cardiologist:   Minus Breeding, MD   Chief Complaint  Patient presents with  . Palpitations      History of Present Illness: Susan Fitzgerald is a 86 y.o. female who presents for evaluation of atrial fibrillation. She was cardioverted. However, she developed recurrent atrial fib. She failed flecainide.  She has been followed in the atrial fib clinic. She did describe chest pain and was sent for [facility] which did not suggest ischemia. She does have a mildly reduced EF (40 - 45% on echo.) Her last echo in March 2019 demonstrated an improved EF of 55 - 60%. She was started on amiodarone and underwent DCCV. She had increased dyspnea and decreased O2 sats. She was treated for pneumonia. She was referred to pulmonary. A CT was done. There was a thought that she might have an amiodarone pulmonary process. Other processes could not be excluded such as a reaction to her methotrexate. There was also suggestion of pulmonary edema. She has been treated with diuresis. She did have an increased creat but this is improved. She was treated with steroids and did improve.She has not been told that she has idiopathic idiopathicpulmonary fibrosis.    She had cardioversion again in March which she probably only helped sinus rhythm for about 4 weeks.  She does not really notice she felt remarkably different during that time.  She felt a little more confident.  However, it is not clear to her that her breathing was better.  Its not clear that she had any more energy.  She does notice the skipping heartbeats but she is not having any presyncope or syncope.  She is not having any chest pressure, neck or arm discomfort.  She is golfing 2 times per week which is really what is very important to her.  She tolerates anticoagulation.  She has had no weight  gain or swelling.  She has had no further epistaxis.  She thinks her breathing is at baseline and has follow-up pulmonary soon.    Past Medical History:  Diagnosis Date  . Calcification of bronchial airway 06/23/2018   01/2017 - See on CT imaging of the chest   . Cataract   . Cerebral vascular disease 06/19/2018   06/19/2018 - MRI with chronic microvascular ischemic change  . Diverticulosis   . Hypertension   . Osteoporosis   . PAF (paroxysmal atrial fibrillation) ([place])    a. s/p prior DCCV; b. On flecainide & coumadin (CHA2DS2VASc = 4);  c. 03/2016 Echo: EF 55-60%, mod LVH, mod AI, mild to mod TR, PASP 99mmHg.  Marland Kitchen Pelvic fracture ([place])   . Pulmonary fibrosis ([place]) 2018  . Rheumatoid arthritis ([place]) 2017    Past Surgical History:  Procedure Laterality Date  . BIOPSY BREAST    . BIOPSY BREAST     right & benign  . BREAST SURGERY    . CARDIOVERSION N/A 10/30/2016   Procedure: CARDIOVERSION;  Surgeon: Larey Dresser, MD;  Location: [place];  Service: Cardiovascular;  Laterality: N/A;  . CARDIOVERSION N/A 08/20/2017   Procedure: CARDIOVERSION;  Surgeon: Jerline Pain, MD;  Location: [facility] ENDOSCOPY;  Service: Cardiovascular;  Laterality: N/A;  . CARDIOVERSION N/A 02/02/2020   Procedure: CARDIOVERSION;  Surgeon: Geralynn Rile, MD;  Location: [facility];  Service: Cardiovascular;  Laterality: N/A;  .  CATARACT EXTRACTION    . COLONOSCOPY    . EYE SURGERY     cataracts  . TEE WITHOUT CARDIOVERSION N/A 10/30/2016   Procedure: TRANSESOPHAGEAL ECHOCARDIOGRAM (TEE);  Surgeon: Larey Dresser, MD;  Location: [facility];  Service: Cardiovascular;  Laterality: N/A;  . TOTAL HIP ARTHROPLASTY Right 01/27/2017   Procedure: TOTAL HIP ARTHROPLASTY ANTERIOR APPROACH;  Surgeon: Rod Can, MD;  Location: [place];  Service: Orthopedics;  Laterality: Right;     Current Outpatient Medications  Medication Sig Dispense Refill  . apixaban (ELIQUIS) 2.5 MG TABS tablet Take 1 tablet (2.5 mg  total) by mouth 2 (two) times daily. 180 tablet 3  . azaTHIOprine (IMURAN) 50 MG tablet Take 50 mg by mouth daily.     . calcium-vitamin D (OSCAL WITH D) 250-125 MG-UNIT per tablet Take 1 tablet by mouth 2 (two) times daily.    Marland Kitchen diltiazem (CARDIZEM CD) 300 MG 24 hr capsule Take 1 capsule (300 mg total) by mouth daily. 90 capsule 3  . ESBRIET 267 MG TABS Take 3 tablets (801 mg total) by mouth in the morning, at noon, and at bedtime. Take three tablets by mouth three times dail 90 tablet 0  . folic acid (FOLVITE) 1 MG tablet Take 1 mg by mouth daily.     . furosemide (LASIX) 20 MG tablet TAKE 1 TO 2 TABLETS BY  MOUTH DAILY AS NEEDED (Patient taking differently: Take 20 mg by mouth daily. ) 180 tablet 0  . gabapentin (NEURONTIN) 100 MG capsule Take 100 mg by mouth at bedtime.     Marland Kitchen levothyroxine (SYNTHROID) 75 MCG tablet Take 1 tablet (75 mcg total) by mouth daily. 90 tablet 1  . lisinopril (ZESTRIL) 20 MG tablet TAKE 1 TABLET BY MOUTH  DAILY (Patient taking differently: Take 20 mg by mouth daily. ) 90 tablet 3  . lisinopril-hydrochlorothiazide (ZESTORETIC) 20-12.5 MG tablet TAKE 1 TABLET BY MOUTH ONCE DAILY IN THE MORNING (Patient taking differently: Take 1 tablet by mouth at bedtime. ) 90 tablet 3  . Melatonin 5 MG TABS Take 5 mg by mouth at bedtime.    . Multiple Vitamin (MULTIVITAMIN) capsule Take 1 capsule by mouth daily.    . potassium chloride SA (KLOR-CON) 20 MEQ tablet TAKE 1 TABLET BY MOUTH  DAILY 90 tablet 3  . Probiotic Product (PROBIOTIC-10 ULTIMATE PO) Take 1 tablet by mouth daily.    Marland Kitchen ALPRAZolam (XANAX) 0.25 MG tablet Take 1/2 hour prior to Pulmonary Function Test. 1 tablet 0   Current Facility-Administered Medications  Medication Dose Route Frequency Provider Last Rate Last Admin  . cyanocobalamin ((VITAMIN B-12)) injection 1,000 mcg  1,000 mcg Intramuscular Q30 days Ronnie Doss M, DO   1,000 mcg at 03/31/20 1513    Allergies:   Benazepril hcl    ROS:  Please see the  history of present illness.   Otherwise, review of systems are positive for none.   All other systems are reviewed and negative.    PHYSICAL EXAM: VS:  BP 124/72   Pulse (!) 104   Ht 5' (1.524 m)   Wt 117 lb (53.1 kg)   BMI 22.85 kg/m  , BMI Body mass index is 22.85 kg/m. GENERAL:  Well appearing NECK:  No jugular venous distention, waveform within normal limits, carotid upstroke brisk and symmetric, no bruits, no thyromegaly LUNGS:  Clear to auscultation bilaterally CHEST: Few basilar crackles HEART:  PMI not displaced or sustained,S1 and S2 within normal limits, no S3, no clicks, no  rubs, no murmurs, irregular ABD:  Flat, positive bowel sounds normal in frequency in pitch, no bruits, no rebound, no guarding, no midline pulsatile mass, no hepatomegaly, no splenomegaly EXT:  2 plus pulses throughout, no edema, no cyanosis no clubbing   EKG:  EKG is  ordered today. Atrial fibrillation, rate 104, axis within normal limits, intervals within normal limits, no acute ST-T wave changes.   Recent Labs: 11/22/2019: ALT 8; Platelets 155 02/02/2020: BUN 29; Creatinine, Ser 1.10; Hemoglobin 13.3; Potassium 3.8; Sodium 141 04/10/2020: TSH 1.100    Lipid Panel    Component Value Date/Time   CHOL 153 04/20/2019 0828   CHOL 131 04/14/2013 0836   TRIG 66 04/20/2019 0828   TRIG 67 09/15/2017 0845   TRIG 69 04/14/2013 0836   HDL 71 04/20/2019 0828   HDL 71 09/15/2017 0845   HDL 53 04/14/2013 0836   CHOLHDL 2.2 04/20/2019 0828   LDLCALC 69 04/20/2019 0828   LDLCALC 64 04/14/2013 0836   LDLDIRECT 98 04/20/2014 1015      Wt Readings from Last 3 Encounters:  05/03/20 117 lb (53.1 kg)  02/02/20 118 lb (53.5 kg)  01/26/20 121 lb (54.9 kg)      Other studies Reviewed: Additional studies/ records that were reviewed today include: BP diary. Review of the above records demonstrates: See elsewhere  ASSESSMENT AND PLAN:  HTN:    Her BP is a little bit labile.  I reviewed extensive readings  from her blood pressure diary.  Overall it is well controlled.  Systolics are sometimes in the 150s and sometimes in the 100 range.  No change in therapy.   ATRIAL FIB:   Ms. VERNIDA MCNICHOLAS has a CHA2DS2 - VASc score of 4.   The rate is a little bit fast today.  However, it is always in the 80s to 90s and well controlled when she takes it.  We had a long discussion about potential further benefit on Tikosyn loading and further cardioversion.  However, at this point she is not inclined to want to do this as she does not think being in regular rhythm is made a huge impact on how she feels.  We discussed the fact that it would be more difficult to repeat visit this decision later as she is less likely to maintain sinus rhythm the longer she is in chronic for him.  She tolerates anticoagulation.  No change in therapy.  Of note she is on the appropriate dose of anticoagulation.  Renal function has been stable.  DYSPNEA:    This is better than it has been in the past.  Is probably multifactorial in part related to her chronic lung condition.  She has follow-up with pulmonary.  She has a good quality of life and is able to golf.  No change in therapy.   COVID EDUCATION:   She is vaccinated.    Current medicines are reviewed at length with the patient today.  The patient does not have concerns regarding medicines.  The following changes have been made:  no change  Labs/ tests ordered today include:   Orders Placed This Encounter  Procedures  . EKG 12-Lead     Disposition:   FU with me in 3 months.     Signed, Minus Breeding, MD  05/03/2020 12:52 PM    [facility]

## 2020-05-03 ENCOUNTER — Ambulatory Visit: Payer: Medicare Other | Admitting: Cardiology

## 2020-05-03 ENCOUNTER — Encounter: Payer: Self-pay | Admitting: Cardiology

## 2020-05-03 ENCOUNTER — Other Ambulatory Visit: Payer: Self-pay

## 2020-05-03 VITALS — BP 124/72 | HR 104 | Ht 60.0 in | Wt 117.0 lb

## 2020-05-03 DIAGNOSIS — I482 Chronic atrial fibrillation, unspecified: Secondary | ICD-10-CM | POA: Diagnosis not present

## 2020-05-03 DIAGNOSIS — I10 Essential (primary) hypertension: Secondary | ICD-10-CM | POA: Diagnosis not present

## 2020-05-03 DIAGNOSIS — R0602 Shortness of breath: Secondary | ICD-10-CM

## 2020-05-03 NOTE — Patient Instructions (Signed)
Medication Instructions:  The current medical regimen is effective;  continue present plan and medications.  *If you need a refill on your cardiac medications before your next appointment, please call your pharmacy*  Follow-Up: At [facility], you and your health needs are our priority.  As part of our continuing mission to provide you with exceptional heart care, we have created designated Provider Care Teams.  These Care Teams include your primary Cardiologist (physician) and Advanced Practice Providers (APPs -  Physician Assistants and Nurse Practitioners) who all work together to provide you with the care you need, when you need it.  We recommend signing up for the patient portal called "MyChart".  Sign up information is provided on this After Visit Summary.  MyChart is used to connect with patients for Virtual Visits (Telemedicine).  Patients are able to view lab/test results, encounter notes, upcoming appointments, etc.  Non-urgent messages can be sent to your provider as well.   To learn more about what you can do with MyChart, go to [email or web address].    Your next appointment:   6 month(s)  The format for your next appointment:   In Person  Provider:   James Hochrein, MD   Thank you for choosing [facility]!!     

## 2020-05-12 ENCOUNTER — Other Ambulatory Visit (HOSPITAL_COMMUNITY): Payer: Medicare Other

## 2020-05-16 ENCOUNTER — Ambulatory Visit: Payer: Medicare Other | Admitting: Pulmonary Disease

## 2020-05-17 ENCOUNTER — Ambulatory Visit: Payer: Medicare Other | Admitting: Pulmonary Disease

## 2020-05-31 ENCOUNTER — Ambulatory Visit (INDEPENDENT_AMBULATORY_CARE_PROVIDER_SITE_OTHER): Payer: Medicare Other | Admitting: *Deleted

## 2020-05-31 ENCOUNTER — Other Ambulatory Visit: Payer: Self-pay

## 2020-05-31 DIAGNOSIS — E538 Deficiency of other specified B group vitamins: Secondary | ICD-10-CM | POA: Diagnosis not present

## 2020-05-31 NOTE — Progress Notes (Signed)
Patient in today for B 12 injection. 1000 mcg given IM in Left deltoid. Patient tolerated well.

## 2020-06-02 ENCOUNTER — Other Ambulatory Visit (HOSPITAL_COMMUNITY)
Admission: RE | Admit: 2020-06-02 | Discharge: 2020-06-02 | Disposition: A | Discharge status: Home / Self Care | Payer: Medicare Other | Source: Ambulatory Visit | Attending: Pulmonary Disease | Admitting: Pulmonary Disease

## 2020-06-02 ENCOUNTER — Other Ambulatory Visit: Payer: Self-pay

## 2020-06-02 DIAGNOSIS — Z20822 Contact with and (suspected) exposure to covid-19: Secondary | ICD-10-CM | POA: Diagnosis not present

## 2020-06-02 DIAGNOSIS — Z01812 Encounter for preprocedural laboratory examination: Secondary | ICD-10-CM | POA: Diagnosis not present

## 2020-06-02 LAB — SARS CORONAVIRUS 2 (TAT 6-24 HRS): SARS Coronavirus 2: NEGATIVE

## 2020-06-05 ENCOUNTER — Telehealth: Payer: Self-pay | Admitting: Pulmonary Disease

## 2020-06-05 ENCOUNTER — Ambulatory Visit (INDEPENDENT_AMBULATORY_CARE_PROVIDER_SITE_OTHER): Payer: Medicare Other | Admitting: Pulmonary Disease

## 2020-06-05 ENCOUNTER — Other Ambulatory Visit: Payer: Self-pay

## 2020-06-05 DIAGNOSIS — J849 Interstitial pulmonary disease, unspecified: Secondary | ICD-10-CM

## 2020-06-05 LAB — PULMONARY FUNCTION TEST
DL/VA % pred: 68 %
DL/VA: 2.87 ml/min/mmHg/L
DLCO cor % pred: 65 %
DLCO cor: 10.3 ml/min/mmHg
DLCO unc % pred: 65 %
DLCO unc: 10.3 ml/min/mmHg
FEF 25-75 Post: 1.42 L/sec
FEF 25-75 Pre: 1.06 L/sec
FEF2575-%Change-Post: 33 %
FEF2575-%Pred-Post: 167 %
FEF2575-%Pred-Pre: 125 %
FEV1-%Change-Post: 5 %
FEV1-%Pred-Post: 127 %
FEV1-%Pred-Pre: 120 %
FEV1-Post: 1.68 L
FEV1-Pre: 1.59 L
FEV1FVC-%Change-Post: 6 %
FEV1FVC-%Pred-Pre: 100 %
FEV6-%Change-Post: 0 %
FEV6-%Pred-Post: 129 %
FEV6-%Pred-Pre: 129 %
FEV6-Post: 2.17 L
FEV6-Pre: 2.17 L
FEV6FVC-%Change-Post: 0 %
FEV6FVC-%Pred-Post: 107 %
FEV6FVC-%Pred-Pre: 107 %
FVC-%Change-Post: 0 %
FVC-%Pred-Post: 119 %
FVC-%Pred-Pre: 120 %
FVC-Post: 2.17 L
FVC-Pre: 2.18 L
Post FEV1/FVC ratio: 77 %
Post FEV6/FVC ratio: 100 %
Pre FEV1/FVC ratio: 73 %
Pre FEV6/FVC Ratio: 100 %
RV % pred: 101 %
RV: 2.32 L
TLC % pred: 99 %
TLC: 4.36 L

## 2020-06-05 NOTE — Progress Notes (Signed)
Full PFT performed today. °

## 2020-06-05 NOTE — Telephone Encounter (Signed)
Call returned pt inquiring re: taking Xanax. Informed about 30 mins prior to PFT  Nothing else needed.Marland KitchenMarland Kitchen

## 2020-06-07 ENCOUNTER — Other Ambulatory Visit: Payer: Self-pay

## 2020-06-07 ENCOUNTER — Ambulatory Visit: Payer: Medicare Other | Admitting: Pulmonary Disease

## 2020-06-07 ENCOUNTER — Encounter: Payer: Self-pay | Admitting: Pulmonary Disease

## 2020-06-07 VITALS — BP 130/68 | HR 87 | Temp 98.4°F | Ht 60.0 in | Wt 117.8 lb

## 2020-06-07 DIAGNOSIS — M0579 Rheumatoid arthritis with rheumatoid factor of multiple sites without organ or systems involvement: Secondary | ICD-10-CM

## 2020-06-07 DIAGNOSIS — R0602 Shortness of breath: Secondary | ICD-10-CM | POA: Diagnosis not present

## 2020-06-07 DIAGNOSIS — Z5181 Encounter for therapeutic drug level monitoring: Secondary | ICD-10-CM

## 2020-06-07 DIAGNOSIS — J849 Interstitial pulmonary disease, unspecified: Secondary | ICD-10-CM

## 2020-06-07 LAB — COMPREHENSIVE METABOLIC PANEL
ALT: 10 U/L (ref 0–35)
AST: 22 U/L (ref 0–37)
Albumin: 3.8 g/dL (ref 3.5–5.2)
Alkaline Phosphatase: 64 U/L (ref 39–117)
BUN: 28 mg/dL — ABNORMAL HIGH (ref 6–23)
CO2: 32 mEq/L (ref 19–32)
Calcium: 9.4 mg/dL (ref 8.4–10.5)
Chloride: 103 mEq/L (ref 96–112)
Creatinine, Ser: 1.31 mg/dL — ABNORMAL HIGH (ref 0.40–1.20)
GFR: 38.48 mL/min — ABNORMAL LOW (ref >60.00)
Glucose, Bld: 86 mg/dL (ref 70–99)
Potassium: 4.3 mEq/L (ref 3.5–5.1)
Sodium: 140 mEq/L (ref 135–145)
Total Bilirubin: 0.5 mg/dL (ref 0.2–1.2)
Total Protein: 6.7 g/dL (ref 6.0–8.3)

## 2020-06-07 LAB — CBC WITH DIFFERENTIAL/PLATELET
Basophils Absolute: 0 10*3/uL (ref 0.0–0.1)
Basophils Relative: 1.1 % (ref 0.0–3.0)
Eosinophils Absolute: 0.1 10*3/uL (ref 0.0–0.7)
Eosinophils Relative: 2.1 % (ref 0.0–5.0)
HCT: 37 % (ref 36.0–46.0)
Hemoglobin: 12.5 g/dL (ref 12.0–15.0)
Lymphocytes Relative: 22.1 % (ref 12.0–46.0)
Lymphs Abs: 0.9 10*3/uL (ref 0.7–4.0)
MCHC: 33.8 g/dL (ref 30.0–36.0)
MCV: 100.5 fl — ABNORMAL HIGH (ref 78.0–100.0)
Monocytes Absolute: 0.5 10*3/uL (ref 0.1–1.0)
Monocytes Relative: 13 % — ABNORMAL HIGH (ref 3.0–12.0)
Neutro Abs: 2.5 10*3/uL (ref 1.4–7.7)
Neutrophils Relative %: 61.7 % (ref 43.0–77.0)
Platelets: 133 10*3/uL — ABNORMAL LOW (ref 150.0–400.0)
RBC: 3.69 Mil/uL — ABNORMAL LOW (ref 3.87–5.11)
RDW: 13.7 % (ref 11.5–15.5)
WBC: 4.1 10*3/uL (ref 4.0–10.5)

## 2020-06-07 NOTE — Progress Notes (Addendum)
Susan Fitzgerald    [id number]    03-Jun-1932  Primary Care Physician:Gottschalk, Koleen Distance, DO  Referring Physician: [name], DO [place],  [place] [address]  Problem list: RA ILD On imuran Ofev poorly tolerated due to side effects On Esbriet since December 5231  HPI: 86 year old with past medical history of atrial fibrillation, rheumatoid arthritis, pulmonary fibrosis.  Referred here for a second opinion on pulmonary fibrosis from Dr. Valeta Harms  She has been diagnosed with rheumatoid arthritis around 2017.  She follows with Dr. Amil Amen.  She was initially on methotrexate which was held briefly in 2019 due to elevated creatinine, but restarted around November 2019 for worsening symptoms.  She did not tolerate leflunomide due to significant hair loss.  Methotrexate was stopped and she was started started on azathioprine after discussion with Dr. 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  This was poorly tolerated due to diarrhea, constipation and lower GI bleed Antifibrotic's change to Esbriet in December 2020  Finished pulmonary rehab in 2020 with improvement in symptoms of dyspnea.  She is back to golfing  Diagnosed with COVID-07 October 2019.  Did not require hospitalization. Follows with Dr. Percival Spanish for management of atrial fibrillation  Extended ILD questionnaire 02/12/2019-no known exposures, no mold, hot tub, Jacuzzi, humidifier, no birds at home. Smoking history: Never smoker Travel history: No significant travel history Relevant family history: No family history of lung disease or pulmonary fibrosis.  Interim history: Continues on Esbriet without issues.  She is tolerating this much better than Ofev Continues to golf and is happy to be back being active No new issues today  States that dyspnea on exertion is stable  Outpatient Encounter Medications as of 06/07/2020   Medication Sig  . apixaban (ELIQUIS) 2.5 MG TABS tablet Take 1 tablet (2.5 mg total) by mouth 2 (two) times daily.  Marland Kitchen azaTHIOprine (IMURAN) 50 MG tablet Take 50 mg by mouth daily.   . calcium-vitamin D (OSCAL WITH D) 250-125 MG-UNIT per tablet Take 1 tablet by mouth 2 (two) times daily.  Marland Kitchen diltiazem (CARDIZEM CD) 300 MG 24 hr capsule Take 1 capsule (300 mg total) by mouth daily.  . ESBRIET 267 MG TABS Take 3 tablets (801 mg total) by mouth in the morning, at noon, and at bedtime. Take three tablets by mouth three times dail  . furosemide (LASIX) 20 MG tablet TAKE 1 TO 2 TABLETS BY  MOUTH DAILY AS NEEDED (Patient taking differently: Take 20 mg by mouth daily. )  . gabapentin (NEURONTIN) 100 MG capsule Take 100 mg by mouth at bedtime.   Marland Kitchen levothyroxine (SYNTHROID) 75 MCG tablet Take 1 tablet (75 mcg total) by mouth daily.  Marland Kitchen lisinopril (ZESTRIL) 20 MG tablet TAKE 1 TABLET BY MOUTH  DAILY (Patient taking differently: Take 20 mg by mouth daily. )  . lisinopril-hydrochlorothiazide (ZESTORETIC) 20-12.5 MG tablet TAKE 1 TABLET BY MOUTH ONCE DAILY IN THE MORNING (Patient taking differently: Take 1 tablet by mouth at bedtime. )  . Melatonin 5 MG TABS Take 5 mg by mouth at bedtime.  . Multiple Vitamin (MULTIVITAMIN) capsule Take 1 capsule by mouth daily.  . potassium chloride SA (KLOR-CON) 20 MEQ tablet TAKE 1 TABLET BY MOUTH  DAILY  . Probiotic Product (PROBIOTIC-10 ULTIMATE PO) Take 1 tablet by mouth daily.  . [DISCONTINUED] ALPRAZolam (XANAX) 0.25 MG tablet Take 1/2 hour prior to  Pulmonary Function Test.  . [DISCONTINUED] folic acid (FOLVITE) 1 MG tablet Take 1 mg by mouth daily.    Facility-Administered Encounter Medications as of 06/07/2020  Medication  . cyanocobalamin ((VITAMIN B-12)) injection 1,000 mcg   Physical Exam: Blood pressure 130/68, pulse 87, temperature 98.4 F (36.9 C), temperature source Oral, height 5' (1.524 m), weight 117 lb 12.8 oz (53.4 kg), SpO2 99 %. Gen:      No acute  distress HEENT:  EOMI, sclera anicteric Neck:     No masses; no thyromegaly Lungs:    Clear to auscultation bilaterally; normal respiratory effort CV:         Regular rate and rhythm; no murmurs Abd:      + bowel sounds; soft, non-tender; no palpable masses, no distension Ext:    No edema; adequate peripheral perfusion Skin:      Warm and dry; no rash Neuro: alert and oriented x 3 Psych: normal mood and affect  Data Reviewed: Imaging: CT chest 02/09/2017- mild reticulation, interstitial prominence at the bases. CT chest 06/23/2018- groundglass attenuation, interlobular septal thickening, subpleural reticulation mild air trapping. CT chest 01/13/2019- peripheral and basilar pattern of subpleural reticulation, groundglass and traction bronchiectasis.  No honeycombing.  Probable UIP pattern. CT high-resolution 08/31/2019-stable pulmonary fibrosis probable UIP pattern I reviewed the images personally.  PFTs: 01/20/2019 FVC 2.17 [114%), FEV1 1.67 [120%],/F 77, TLC 78%, DLCO 56% Minimal restriction with moderate diffusion impairment.  06/05/2020 FVC 2.17 [119%], FEV1 1.68 [127%], F/F 77, TLC 4.36 [99%], DLCO 10.30 [65%] Mild diffusion impairment.  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  Other considerations include amiodarone toxicity but she has been off amiodarone for some time now.  Methotrexate can cause pulmonary fibrosis in this pattern as well. Would not recommend work-up for IPF such as lung biopsy as it would not change our recommended management.   Did not tolerate Ofev but is doing well with esbriet PFTs today reviewed with slight improvement in lung volumes and diffusion capacity Finished pulmonary rehab and remains active with golf  Check CBC, CMP, N-terminal BNP today for  monitoring High-resolution CT in 3 months and follow-up in clinic  Plan/Recommendations: - Continue Esbriet - Labs for monitoring - CT scan in 3 months l.  Marshell Garfinkel MD [facility] 06/07/2020, 10:43 AM  CC: [name], DO

## 2020-06-07 NOTE — Patient Instructions (Addendum)
I have reviewed your pulmonary function test today which shows slight improvement in lung capacity which is good news.  We will check some labs today including CBC and CMP, N-terminal proBNP Schedule high-resolution CT in 3 months Follow-up in clinic in 3 months

## 2020-06-07 NOTE — Addendum Note (Signed)
Addended by: Suzzanne Cloud E on: 06/07/2020 11:16 AM   Modules accepted: Orders

## 2020-06-08 LAB — PRO B NATRIURETIC PEPTIDE: NT-Pro BNP: 2630 pg/mL — ABNORMAL HIGH (ref 0–738)

## 2020-06-26 ENCOUNTER — Telehealth: Payer: Self-pay | Admitting: Cardiology

## 2020-06-26 DIAGNOSIS — I480 Paroxysmal atrial fibrillation: Secondary | ICD-10-CM

## 2020-06-26 DIAGNOSIS — R7989 Other specified abnormal findings of blood chemistry: Secondary | ICD-10-CM

## 2020-06-26 DIAGNOSIS — R0602 Shortness of breath: Secondary | ICD-10-CM

## 2020-06-26 NOTE — Telephone Encounter (Signed)
  Patient is calling stating she is returning a call from our office from earlier today.

## 2020-06-26 NOTE — Telephone Encounter (Signed)
Minus Breeding, MD  06/18/2020 3:33 PM EDT     BNP was elevated and Dr. [name] Browner is requesting an echocardiogram. Please order this. Please call patient to let her know of this request and order. Thanks. Call Susan Fitzgerald with the results and send results to [name], DO   Pt is aware of lab results and the order to have an echocardiogram completed.  Pt would prefer having it completed in [place].  She is aware she will be called to be scheduled.  Aware if not at home number she will be called on her cell number as requested. [phone].

## 2020-07-05 ENCOUNTER — Other Ambulatory Visit: Payer: Self-pay

## 2020-07-05 ENCOUNTER — Ambulatory Visit (HOSPITAL_COMMUNITY)
Admission: RE | Admit: 2020-07-05 | Discharge: 2020-07-05 | Disposition: A | Discharge status: Home / Self Care | Payer: Medicare Other | Source: Ambulatory Visit | Attending: Cardiology | Admitting: Cardiology

## 2020-07-05 DIAGNOSIS — I480 Paroxysmal atrial fibrillation: Secondary | ICD-10-CM | POA: Insufficient documentation

## 2020-07-05 DIAGNOSIS — R7989 Other specified abnormal findings of blood chemistry: Secondary | ICD-10-CM | POA: Diagnosis not present

## 2020-07-05 DIAGNOSIS — R0602 Shortness of breath: Secondary | ICD-10-CM | POA: Insufficient documentation

## 2020-07-05 LAB — ECHOCARDIOGRAM COMPLETE
Area-P 1/2: 3.74 cm2
P 1/2 time: 466 msec
S' Lateral: 2.76 cm

## 2020-07-05 NOTE — Progress Notes (Signed)
*  PRELIMINARY RESULTS* Echocardiogram 2D Echocardiogram has been performed.  Samuel [name] 07/05/2020, 12:42 PM

## 2020-07-14 ENCOUNTER — Telehealth: Payer: Self-pay | Admitting: Pulmonary Disease

## 2020-07-14 NOTE — Telephone Encounter (Signed)
Labs routed to Dr Amil Amen from 06/07/20  Spoke with the pt and notified of this and she verbalized understanding  Nothing further needed

## 2020-07-17 ENCOUNTER — Other Ambulatory Visit: Payer: Self-pay | Admitting: Pulmonary Disease

## 2020-07-23 ENCOUNTER — Other Ambulatory Visit: Payer: Self-pay | Admitting: Family Medicine

## 2020-08-07 ENCOUNTER — Other Ambulatory Visit: Payer: Self-pay | Admitting: Pulmonary Disease

## 2020-08-15 ENCOUNTER — Telehealth: Payer: Self-pay | Admitting: Pulmonary Disease

## 2020-08-16 NOTE — Telephone Encounter (Signed)
Called and spoke with pt and answered questions she had in regards to the covid booster. Nothing further needed.

## 2020-08-17 ENCOUNTER — Telehealth: Payer: Self-pay | Admitting: Cardiology

## 2020-08-17 NOTE — Telephone Encounter (Signed)
I spoke with patient and reviewed echo results with her 

## 2020-08-17 NOTE — Telephone Encounter (Signed)
    Pt is returning Pat's call to get echo result

## 2020-08-28 ENCOUNTER — Other Ambulatory Visit: Payer: Self-pay | Admitting: Pulmonary Disease

## 2020-08-29 ENCOUNTER — Telehealth: Payer: Self-pay

## 2020-08-29 ENCOUNTER — Other Ambulatory Visit (INDEPENDENT_AMBULATORY_CARE_PROVIDER_SITE_OTHER): Payer: Medicare Other

## 2020-08-29 DIAGNOSIS — R300 Dysuria: Secondary | ICD-10-CM

## 2020-08-29 DIAGNOSIS — E538 Deficiency of other specified B group vitamins: Secondary | ICD-10-CM | POA: Diagnosis not present

## 2020-08-29 LAB — MICROSCOPIC EXAMINATION: WBC, UA: >30 /hpf — AB (ref 0–5)

## 2020-08-29 LAB — URINALYSIS, COMPLETE
Bilirubin, UA: NEGATIVE
Glucose, UA: NEGATIVE
Nitrite, UA: NEGATIVE
Specific Gravity, UA: 1.02 (ref 1.005–1.030)
Urobilinogen, Ur: 0.2 mg/dL (ref 0.2–1.0)
pH, UA: 6 (ref 5.0–7.5)

## 2020-08-29 NOTE — Telephone Encounter (Signed)
Is it ok for this patient to drop off a urine specimen?

## 2020-08-29 NOTE — Telephone Encounter (Signed)
Patient notified. Came by and left urine and front notified patient that she would need a televisit appt. Patient agreed and appt was made.

## 2020-08-29 NOTE — Telephone Encounter (Signed)
Yes, please make sure she at least has a televisit with someone

## 2020-08-30 ENCOUNTER — Encounter: Payer: Self-pay | Admitting: Family Medicine

## 2020-08-30 ENCOUNTER — Ambulatory Visit (INDEPENDENT_AMBULATORY_CARE_PROVIDER_SITE_OTHER): Payer: Medicare Other | Admitting: Family Medicine

## 2020-08-30 DIAGNOSIS — N3001 Acute cystitis with hematuria: Secondary | ICD-10-CM | POA: Diagnosis not present

## 2020-08-30 MED ORDER — CEPHALEXIN 500 MG PO CAPS: 500.0000 mg | ORAL_CAPSULE | Freq: Four times a day (QID) | ORAL | 0 refills | Status: DC

## 2020-08-30 NOTE — Patient Instructions (Signed)
Urinary Tract Infection, Adult A urinary tract infection (UTI) is an infection of any part of the urinary tract. The urinary tract includes:  The kidneys.  The ureters.  The bladder.  The urethra. These organs make, store, and get rid of pee (urine) in the body. What are the causes? This is caused by germs (bacteria) in your genital area. These germs grow and cause swelling (inflammation) of your urinary tract. What increases the risk? You are more likely to develop this condition if:  You have a small, thin tube (catheter) to drain pee.  You cannot control when you pee or poop (incontinence).  You are female, and: ? You use these methods to prevent pregnancy:  A medicine that kills sperm (spermicide).  A device that blocks sperm (diaphragm). ? You have low levels of a female hormone (estrogen). ? You are pregnant.  You have genes that add to your risk.  You are sexually active.  You take antibiotic medicines.  You have trouble peeing because of: ? A prostate that is bigger than normal, if you are female. ? A blockage in the part of your body that drains pee from the bladder (urethra). ? A kidney stone. ? A nerve condition that affects your bladder (neurogenic bladder). ? Not getting enough to drink. ? Not peeing often enough.  You have other conditions, such as: ? Diabetes. ? A weak disease-fighting system (immune system). ? Sickle cell disease. ? Gout. ? Injury of the spine. What are the signs or symptoms? Symptoms of this condition include:  Needing to pee right away (urgently).  Peeing often.  Peeing small amounts often.  Pain or burning when peeing.  Blood in the pee.  Pee that smells bad or not like normal.  Trouble peeing.  Pee that is cloudy.  Fluid coming from the vagina, if you are female.  Pain in the belly or lower back. Other symptoms include:  Throwing up (vomiting).  No urge to eat.  Feeling mixed up (confused).  Being tired  and grouchy (irritable).  A fever.  Watery poop (diarrhea). How is this treated? This condition may be treated with:  Antibiotic medicine.  Other medicines.  Drinking enough water. Follow these instructions at home:  Medicines  Take over-the-counter and prescription medicines only as told by your doctor.  If you were prescribed an antibiotic medicine, take it as told by your doctor. Do not stop taking it even if you start to feel better. General instructions  Make sure you: ? Pee until your bladder is empty. ? Do not hold pee for a long time. ? Empty your bladder after sex. ? Wipe from front to back after pooping if you are a female. Use each tissue one time when you wipe.  Drink enough fluid to keep your pee pale yellow.  Keep all follow-up visits as told by your doctor. This is important. Contact a doctor if:  You do not get better after 1-2 days.  Your symptoms go away and then come back. Get help right away if:  You have very bad back pain.  You have very bad pain in your lower belly.  You have a fever.  You are sick to your stomach (nauseous).  You are throwing up. Summary  A urinary tract infection (UTI) is an infection of any part of the urinary tract.  This condition is caused by germs in your genital area.  There are many risk factors for a UTI. These include having a small, thin   tube to drain pee and not being able to control when you pee or poop.  Treatment includes antibiotic medicines for germs.  Drink enough fluid to keep your pee pale yellow. This information is not intended to replace advice given to you by your health care provider. Make sure you discuss any questions you have with your health care provider. Document Revised: 10/22/2018 Document Reviewed: 05/14/2018 Elsevier Patient Education  2020 Elsevier Inc.  

## 2020-08-30 NOTE — Progress Notes (Signed)
Virtual Visit via Telephone Note  I connected with FANNIE GATHRIGHT on 08/30/20 at 8:58 AM by telephone and verified that I am speaking with the correct person using two identifiers. ROSELLA CRANDELL is currently located at home and her husband is currently with her during this visit. The provider, Loman Brooklyn, FNP is located in their office at time of visit.  I discussed the limitations, risks, security and privacy concerns of performing an evaluation and management service by telephone and the availability of in person appointments. I also discussed with the patient that there may be a patient responsible charge related to this service. The patient expressed understanding and agreed to proceed.  Subjective: PCP: [name], DO  Chief Complaint  Patient presents with  . Urinary Tract Infection   Urinary Tract Infection: Patient complains of dysuria and frequency. She has had symptoms for 1 week.  Patient denies back pain and fever. Patient does have a history of recurrent UTI.  Patient does not have a history of pyelonephritis.    ROS: Per HPI  Current Outpatient Medications:  .  apixaban (ELIQUIS) 2.5 MG TABS tablet, Take 1 tablet (2.5 mg total) by mouth 2 (two) times daily., Disp: 180 tablet, Rfl: 3 .  azaTHIOprine (IMURAN) 50 MG tablet, Take 50 mg by mouth daily. , Disp: , Rfl:  .  calcium-vitamin D (OSCAL WITH D) 250-125 MG-UNIT per tablet, Take 1 tablet by mouth 2 (two) times daily., Disp: , Rfl:  .  diltiazem (CARDIZEM CD) 300 MG 24 hr capsule, Take 1 capsule (300 mg total) by mouth daily., Disp: 90 capsule, Rfl: 3 .  ESBRIET 267 MG TABS, TAKE 3 TABLETS (801MG ) BY  MOUTH THREE TIMES DAILY  WITH FOOD, Disp: 270 tablet, Rfl: 0 .  furosemide (LASIX) 20 MG tablet, TAKE 1 TO 2 TABLETS BY  MOUTH DAILY AS NEEDED (Patient taking differently: Take 20 mg by mouth daily. ), Disp: 180 tablet, Rfl: 0 .  gabapentin (NEURONTIN) 100 MG capsule, Take 100 mg by mouth at bedtime. , Disp: ,  Rfl:  .  levothyroxine (SYNTHROID) 75 MCG tablet, TAKE 1 TABLET BY MOUTH  DAILY, Disp: 90 tablet, Rfl: 1 .  lisinopril (ZESTRIL) 20 MG tablet, TAKE 1 TABLET BY MOUTH  DAILY (Patient taking differently: Take 20 mg by mouth daily. ), Disp: 90 tablet, Rfl: 3 .  lisinopril-hydrochlorothiazide (ZESTORETIC) 20-12.5 MG tablet, TAKE 1 TABLET BY MOUTH ONCE DAILY IN THE MORNING (Patient taking differently: Take 1 tablet by mouth at bedtime. ), Disp: 90 tablet, Rfl: 3 .  Melatonin 5 MG TABS, Take 5 mg by mouth at bedtime., Disp: , Rfl:  .  Multiple Vitamin (MULTIVITAMIN) capsule, Take 1 capsule by mouth daily., Disp: , Rfl:  .  potassium chloride SA (KLOR-CON) 20 MEQ tablet, TAKE 1 TABLET BY MOUTH  DAILY, Disp: 90 tablet, Rfl: 3 .  Probiotic Product (PROBIOTIC-10 ULTIMATE PO), Take 1 tablet by mouth daily., Disp: , Rfl:   Current Facility-Administered Medications:  .  cyanocobalamin ((VITAMIN B-12)) injection 1,000 mcg, 1,000 mcg, Intramuscular, Q30 days, Ronnie Doss M, DO, 1,000 mcg at 08/29/20 1618  Allergies  Allergen Reactions  . Benazepril Hcl Cough   Past Medical History:  Diagnosis Date  . Calcification of bronchial airway 06/23/2018   01/2017 - See on CT imaging of the chest   . Cataract   . Cerebral vascular disease 06/19/2018   06/19/2018 - MRI with chronic microvascular ischemic change  . Diverticulosis   .  Hypertension   . Osteoporosis   . PAF (paroxysmal atrial fibrillation) ([place])    a. s/p prior DCCV; b. On flecainide & coumadin (CHA2DS2VASc = 4);  c. 03/2016 Echo: EF 55-60%, mod LVH, mod AI, mild to mod TR, PASP 14mmHg.  Marland Kitchen Pelvic fracture ([place])   . Pulmonary fibrosis ([place]) 2018  . Rheumatoid arthritis ([place]) 2017    Observations/Objective: A&O  No respiratory distress or wheezing audible over the phone Mood, judgement, and thought processes all WNL   Assessment and Plan: 1. Acute cystitis with hematuria - Education provided on UTIs. Encouraged adequate hydration.  -  cephALEXin (KEFLEX) 500 MG capsule; Take 1 capsule (500 mg total) by mouth 4 (four) times daily for 10 days.  Dispense: 40 capsule; Refill: 0   Follow Up Instructions:  I discussed the assessment and treatment plan with the patient. The patient was provided an opportunity to ask questions and all were answered. The patient agreed with the plan and demonstrated an understanding of the instructions.   The patient was advised to call back or seek an in-person evaluation if the symptoms worsen or if the condition fails to improve as anticipated.  The above assessment and management plan was discussed with the patient. The patient verbalized understanding of and has agreed to the management plan. Patient is aware to call the clinic if symptoms persist or worsen. Patient is aware when to return to the clinic for a follow-up visit. Patient educated on when it is appropriate to go to the emergency department.   Time call ended: 9:04 AM  I provided 8 minutes of non-face-to-face time during this encounter.  Hendricks Limes, MSN, APRN, FNP-C [facility] 08/30/20

## 2020-09-01 ENCOUNTER — Other Ambulatory Visit: Payer: Self-pay | Admitting: Family Medicine

## 2020-09-01 ENCOUNTER — Ambulatory Visit: Payer: Medicare Other

## 2020-09-01 DIAGNOSIS — N3001 Acute cystitis with hematuria: Secondary | ICD-10-CM

## 2020-09-01 LAB — URINE CULTURE

## 2020-09-01 MED ORDER — CEFDINIR 300 MG PO CAPS: 300.0000 mg | ORAL_CAPSULE | Freq: Two times a day (BID) | ORAL | 0 refills | Status: DC

## 2020-09-04 MED ORDER — CEFDINIR 300 MG PO CAPS: 300.0000 mg | ORAL_CAPSULE | Freq: Two times a day (BID) | ORAL | 0 refills | Status: AC

## 2020-09-04 NOTE — Progress Notes (Signed)
E-prescribe down. resent 

## 2020-09-06 ENCOUNTER — Telehealth: Payer: Self-pay | Admitting: Cardiology

## 2020-09-06 ENCOUNTER — Ambulatory Visit
Admission: RE | Admit: 2020-09-06 | Discharge: 2020-09-06 | Disposition: A | Discharge status: Home / Self Care | Payer: Medicare Other | Source: Ambulatory Visit | Attending: Pulmonary Disease | Admitting: Pulmonary Disease

## 2020-09-06 DIAGNOSIS — J84112 Idiopathic pulmonary fibrosis: Secondary | ICD-10-CM | POA: Diagnosis not present

## 2020-09-06 DIAGNOSIS — J849 Interstitial pulmonary disease, unspecified: Secondary | ICD-10-CM

## 2020-09-06 DIAGNOSIS — I2510 Atherosclerotic heart disease of native coronary artery without angina pectoris: Secondary | ICD-10-CM | POA: Diagnosis not present

## 2020-09-06 DIAGNOSIS — I700 Atherosclerosis of aorta: Secondary | ICD-10-CM | POA: Diagnosis not present

## 2020-09-06 DIAGNOSIS — I517 Cardiomegaly: Secondary | ICD-10-CM | POA: Diagnosis not present

## 2020-09-06 NOTE — Telephone Encounter (Signed)
Pt having labs with her PCP and asking if Dr. Percival Spanish would like any specific labs prior to her 11/06/20 appt with him... she on Lasix, Lisinopril, eliquis no statin..... last labs cmet, cbc, BNP (2,630) 06/07/20... elevated BUN and Creat.   Will forward to him for his review.

## 2020-09-06 NOTE — Telephone Encounter (Signed)
Please make sure there is a CBC and BMET

## 2020-09-06 NOTE — Telephone Encounter (Signed)
Patient states she will be seeing her PCP [facility] 09/15/20 for lab work. She would like to know if Dr. Percival Spanish has any labs he wants her to have done before her appointment with him in December. Their office number: [phone]

## 2020-09-07 NOTE — Telephone Encounter (Signed)
Spoke with the pt and advised her Dr. Rosezella Florida reccommendations to have a BMET and CBC added to her PCP labs being done 09/18/20.

## 2020-09-08 DIAGNOSIS — H40033 Anatomical narrow angle, bilateral: Secondary | ICD-10-CM | POA: Diagnosis not present

## 2020-09-08 DIAGNOSIS — H2513 Age-related nuclear cataract, bilateral: Secondary | ICD-10-CM | POA: Diagnosis not present

## 2020-09-11 ENCOUNTER — Telehealth: Payer: Self-pay

## 2020-09-15 ENCOUNTER — Other Ambulatory Visit (INDEPENDENT_AMBULATORY_CARE_PROVIDER_SITE_OTHER): Payer: Medicare Other

## 2020-09-15 ENCOUNTER — Other Ambulatory Visit: Payer: Self-pay

## 2020-09-15 ENCOUNTER — Other Ambulatory Visit: Payer: Self-pay | Admitting: Family Medicine

## 2020-09-15 DIAGNOSIS — E039 Hypothyroidism, unspecified: Secondary | ICD-10-CM

## 2020-09-15 DIAGNOSIS — I700 Atherosclerosis of aorta: Secondary | ICD-10-CM

## 2020-09-15 DIAGNOSIS — N184 Chronic kidney disease, stage 4 (severe): Secondary | ICD-10-CM

## 2020-09-16 LAB — CBC WITH DIFFERENTIAL/PLATELET
Basophils Absolute: 0.1 10*3/uL (ref 0.0–0.2)
Basos: 1 %
EOS (ABSOLUTE): 0.1 10*3/uL (ref 0.0–0.4)
Eos: 2 %
Hematocrit: 39.8 % (ref 34.0–46.6)
Hemoglobin: 13.4 g/dL (ref 11.1–15.9)
Immature Grans (Abs): 0 10*3/uL (ref 0.0–0.1)
Immature Granulocytes: 0 %
Lymphocytes Absolute: 1.4 10*3/uL (ref 0.7–3.1)
Lymphs: 26 %
MCH: 32.8 pg (ref 26.6–33.0)
MCHC: 33.7 g/dL (ref 31.5–35.7)
MCV: 98 fL — ABNORMAL HIGH (ref 79–97)
Monocytes Absolute: 0.6 10*3/uL (ref 0.1–0.9)
Monocytes: 12 %
Neutrophils Absolute: 3 10*3/uL (ref 1.4–7.0)
Neutrophils: 59 %
Platelets: 149 10*3/uL — ABNORMAL LOW (ref 150–450)
RBC: 4.08 x10E6/uL (ref 3.77–5.28)
RDW: 12.2 % (ref 11.7–15.4)
WBC: 5.1 10*3/uL (ref 3.4–10.8)

## 2020-09-16 LAB — CMP14+EGFR
ALT: 11 IU/L (ref 0–32)
AST: 23 IU/L (ref 0–40)
Albumin/Globulin Ratio: 1.5 (ref 1.2–2.2)
Albumin: 4 g/dL (ref 3.6–4.6)
Alkaline Phosphatase: 74 IU/L (ref 44–121)
BUN/Creatinine Ratio: 24 (ref 12–28)
BUN: 26 mg/dL (ref 8–27)
Bilirubin Total: 0.4 mg/dL (ref 0.0–1.2)
CO2: 27 mmol/L (ref 20–29)
Calcium: 9.6 mg/dL (ref 8.7–10.3)
Chloride: 100 mmol/L (ref 96–106)
Creatinine, Ser: 1.09 mg/dL — ABNORMAL HIGH (ref 0.57–1.00)
GFR calc Af Amer: 53 mL/min/{1.73_m2} — ABNORMAL LOW (ref >59)
GFR calc non Af Amer: 46 mL/min/{1.73_m2} — ABNORMAL LOW (ref >59)
Globulin, Total: 2.6 g/dL (ref 1.5–4.5)
Glucose: 80 mg/dL (ref 65–99)
Potassium: 3.7 mmol/L (ref 3.5–5.2)
Sodium: 140 mmol/L (ref 134–144)
Total Protein: 6.6 g/dL (ref 6.0–8.5)

## 2020-09-16 LAB — THYROID PANEL WITH TSH
Free Thyroxine Index: 2 (ref 1.2–4.9)
T3 Uptake Ratio: 27 % (ref 24–39)
T4, Total: 7.3 ug/dL (ref 4.5–12.0)
TSH: 1.95 u[IU]/mL (ref 0.450–4.500)

## 2020-09-16 LAB — LIPID PANEL
Chol/HDL Ratio: 2.2 ratio (ref 0.0–4.4)
Cholesterol, Total: 172 mg/dL (ref 100–199)
HDL: 79 mg/dL (ref >39)
LDL Chol Calc (NIH): 81 mg/dL (ref 0–99)
Triglycerides: 59 mg/dL (ref 0–149)
VLDL Cholesterol Cal: 12 mg/dL (ref 5–40)

## 2020-09-18 ENCOUNTER — Other Ambulatory Visit: Payer: Self-pay | Admitting: Cardiology

## 2020-09-18 ENCOUNTER — Encounter: Payer: Self-pay | Admitting: Family Medicine

## 2020-09-18 ENCOUNTER — Other Ambulatory Visit: Payer: Self-pay

## 2020-09-18 ENCOUNTER — Ambulatory Visit (INDEPENDENT_AMBULATORY_CARE_PROVIDER_SITE_OTHER): Payer: Medicare Other | Admitting: Family Medicine

## 2020-09-18 ENCOUNTER — Telehealth: Payer: Self-pay | Admitting: Pulmonary Disease

## 2020-09-18 VITALS — BP 129/82 | HR 82 | Temp 97.8°F | Ht 60.0 in | Wt 117.0 lb

## 2020-09-18 DIAGNOSIS — J849 Interstitial pulmonary disease, unspecified: Secondary | ICD-10-CM

## 2020-09-18 DIAGNOSIS — N3941 Urge incontinence: Secondary | ICD-10-CM

## 2020-09-18 DIAGNOSIS — N1831 Chronic kidney disease, stage 3a: Secondary | ICD-10-CM

## 2020-09-18 DIAGNOSIS — E039 Hypothyroidism, unspecified: Secondary | ICD-10-CM | POA: Diagnosis not present

## 2020-09-18 DIAGNOSIS — I700 Atherosclerosis of aorta: Secondary | ICD-10-CM | POA: Diagnosis not present

## 2020-09-18 DIAGNOSIS — I482 Chronic atrial fibrillation, unspecified: Secondary | ICD-10-CM

## 2020-09-18 NOTE — Telephone Encounter (Signed)
Spoke to patient and applied for new grant information.Patient was approved for new PF grant through [employer] for copay assistance. Coverage dates are 09/06/20-09/05/21  Pharmacy Card [phone] [id number]  Called pharmacy and provided information. Called patient to advise.  Patient is requesting a call back to discuss results of HRCT from 09/06/20. Patient also is requesting refill for her Esbriet rx to be sent to [facility].

## 2020-09-18 NOTE — Progress Notes (Signed)
Subjective: CC: Follow-up labs PCP: [name], DO DQQ:IWLNL Susan Fitzgerald is a 86 y.o. female presenting to clinic today for:  1.  Atrial fibrillation, hypertension, CKD3a Patient reports compliance with lisinopril 20 mg every afternoon and 20-12.5 mg with hydrochlorothiazide every morning.  She is also on Eliquis for anticoagulation and diltiazem.  She failed to get another cardioversion.  There have been talks about starting Tikosyn but she is reluctant to start this.  Not having any heart palpitations, hematochezia, melena, epistaxis, vaginal bleeding or hematuria.  She monitors blood pressures and they range anywhere from systolics of 89Q to systolics of 119E.  There is no rhyme or reason to this.  She does try to take Lasix daily unless she is going to golf  2.  Hypothyroidism Patient is compliant with Synthroid.  Again, no reports of heart palpitations, tremor, change in bowel habit or weight.  3.  Urinary incontinence Patient reports urge incontinence that seems to be more prominent with Lasix use.  She often will dribble if she needs to go really badly.  Denies any stress incontinence.  Uses a pad daily.   ROS: Per HPI  Allergies  Allergen Reactions   Benazepril Hcl Cough   Past Medical History:  Diagnosis Date   Calcification of bronchial airway 06/23/2018   01/2017 - See on CT imaging of the chest    Cataract    Cerebral vascular disease 06/19/2018   06/19/2018 - MRI with chronic microvascular ischemic change   Diverticulosis    Hypertension    Osteoporosis    PAF (paroxysmal atrial fibrillation) ([place])    a. s/p prior DCCV; b. On flecainide & coumadin (CHA2DS2VASc = 4);  c. 03/2016 Echo: EF 55-60%, mod LVH, mod AI, mild to mod TR, PASP 32mmHg.   Pelvic fracture ([facility])    Pulmonary fibrosis ([place]) 2018   Rheumatoid arthritis ([place]) 2017    Current Outpatient Medications:    apixaban (ELIQUIS) 2.5 MG TABS tablet, Take 1 tablet (2.5 mg total) by mouth 2 (two)  times daily., Disp: 180 tablet, Rfl: 3   azaTHIOprine (IMURAN) 50 MG tablet, Take 50 mg by mouth daily. , Disp: , Rfl:    calcium-vitamin D (OSCAL WITH D) 250-125 MG-UNIT per tablet, Take 1 tablet by mouth 2 (two) times daily., Disp: , Rfl:    diltiazem (CARDIZEM CD) 300 MG 24 hr capsule, Take 1 capsule (300 mg total) by mouth daily., Disp: 90 capsule, Rfl: 3   ESBRIET 267 MG TABS, TAKE 3 TABLETS (801MG ) BY  MOUTH THREE TIMES DAILY  WITH FOOD, Disp: 270 tablet, Rfl: 0   furosemide (LASIX) 20 MG tablet, TAKE 1 TO 2 TABLETS BY  MOUTH DAILY AS NEEDED (Patient taking differently: Take 20 mg by mouth daily. ), Disp: 180 tablet, Rfl: 0   gabapentin (NEURONTIN) 100 MG capsule, Take 100 mg by mouth at bedtime. , Disp: , Rfl:    levothyroxine (SYNTHROID) 75 MCG tablet, TAKE 1 TABLET BY MOUTH  DAILY, Disp: 90 tablet, Rfl: 1   lisinopril (ZESTRIL) 20 MG tablet, TAKE 1 TABLET BY MOUTH  DAILY (Patient taking differently: Take 20 mg by mouth daily. ), Disp: 90 tablet, Rfl: 3   lisinopril-hydrochlorothiazide (ZESTORETIC) 20-12.5 MG tablet, TAKE 1 TABLET BY MOUTH ONCE DAILY IN THE MORNING (Patient taking differently: Take 1 tablet by mouth at bedtime. ), Disp: 90 tablet, Rfl: 3   Melatonin 5 MG TABS, Take 5 mg by mouth at bedtime., Disp: , Rfl:    Multiple  Vitamin (MULTIVITAMIN) capsule, Take 1 capsule by mouth daily., Disp: , Rfl:    potassium chloride SA (KLOR-CON) 20 MEQ tablet, TAKE 1 TABLET BY MOUTH  DAILY, Disp: 90 tablet, Rfl: 3   Probiotic Product (PROBIOTIC-10 ULTIMATE PO), Take 1 tablet by mouth daily., Disp: , Rfl:   Current Facility-Administered Medications:    cyanocobalamin ((VITAMIN B-12)) injection 1,000 mcg, 1,000 mcg, Intramuscular, Q30 days, Susan Huizar M, DO, 1,000 mcg at 08/29/20 1618 Social History   Socioeconomic History   Marital status: Married    Spouse name: Conservation officer, nature of children: 2   Years of education: 12   Highest education level: Not on file    Occupational History   Not on file  Tobacco Use   Smoking status: Never Smoker   Smokeless tobacco: Never Used  Scientific laboratory technician Use: Never used  Substance and Sexual Activity   Alcohol use: No   Drug use: No   Sexual activity: Yes  Other Topics Concern   Not on file  Social History Narrative   Not on file   Social Determinants of Health   Financial Resource Strain:    Difficulty of Paying Living Expenses: Not on file  Food Insecurity:    Worried About Charity fundraiser in the Last Year: Not on file   [employer] of Food in the Last Year: Not on file  Transportation Needs:    Lack of Transportation (Medical): Not on file   Lack of Transportation (Non-Medical): Not on file  Physical Activity:    Days of Exercise per Week: Not on file   Minutes of Exercise per Session: Not on file  Stress:    Feeling of Stress : Not on file  Social Connections:    Frequency of Communication with Friends and Family: Not on file   Frequency of Social Gatherings with Friends and Family: Not on file   Attends Religious Services: Not on file   Active Member of Clubs or Organizations: Not on file   Attends Archivist Meetings: Not on file   Marital Status: Not on file  Intimate Partner Violence:    Fear of Current or Ex-Partner: Not on file   Emotionally Abused: Not on file   Physically Abused: Not on file   Sexually Abused: Not on file   Family History  Problem Relation Age of Onset   Stroke Mother        cerebral hemorrhage   Heart disease Father        MI   Heart attack Father    Vision loss Father    Heart disease Brother    Heart attack Brother    Cancer Maternal Aunt        breast   Cancer Brother    Heart disease Brother    Cancer Daughter        breast   Colon cancer Neg Hx    Colon polyps Neg Hx    Rectal cancer Neg Hx    Stomach cancer Neg Hx     Objective: Office vital signs reviewed. BP 129/82    Pulse 82     Temp 97.8 F (36.6 C) (Temporal)    Ht 5' (1.524 Fitzgerald)    Wt 117 lb (53.1 kg)    SpO2 98%    BMI 22.85 kg/Fitzgerald   Physical Examination:  General: Awake, alert, well nourished, No acute distress HEENT: Normal; sclera white.  No exophthalmos Cardio: Irregularly irregular, S1S2  heard, no murmurs appreciated Pulm: clear to auscultation bilaterally, no wheezes, rhonchi or rales; normal work of breathing on room air Extremities: warm, well perfused, No edema, cyanosis or clubbing; +2 pulses bilaterally MSK: Ambulating independently Neuro: No tremor  Assessment/ Plan: 86 y.o. female   1. Atrial fibrillation, chronic (HCC) Rate controlled.  No red flags with anticoagulation.  She was given a months worth of Eliquis samples today.  She will come back to see Almyra Free for patient assistance.  2. Stage 3a chronic kidney disease ([place]) Renal function is stable.  Reviewed today's labs  3. Acquired hypothyroidism Thyroid panel normal.  Continue current regimen  4. Aortic atherosclerosis (HCC) Continue statin  5. Interstitial pulmonary disease (HCC) Stable.  Will CC labs to Dr. [name] Browner  6. Urge incontinence of urine We discussed Kegel exercises.  May consider Myrbetriq if symptoms do not improve   No orders of the defined types were placed in this encounter.  No orders of the defined types were placed in this encounter.    [name], DO [facility] [phone]

## 2020-09-18 NOTE — Telephone Encounter (Signed)
CT shows stable lung scarring with no progression. Continue current therapy   Spoke with pt and notified of the results per Dr [name] Browner  She verbalized understanding  Nothing further needed

## 2020-09-19 ENCOUNTER — Ambulatory Visit (INDEPENDENT_AMBULATORY_CARE_PROVIDER_SITE_OTHER): Payer: Medicare Other | Admitting: Pharmacist

## 2020-09-19 ENCOUNTER — Ambulatory Visit: Payer: Medicare Other

## 2020-09-19 DIAGNOSIS — I4819 Other persistent atrial fibrillation: Secondary | ICD-10-CM

## 2020-09-19 MED ORDER — APIXABAN 2.5 MG PO TABS
2.5000 mg | ORAL_TABLET | Freq: Two times a day (BID) | ORAL | 3 refills | Status: DC
Start: 2020-09-19 — End: 2021-09-24

## 2020-09-19 NOTE — Progress Notes (Signed)
    09/19/2020 Name: Susan Fitzgerald MRN: [id number] DOB: 1932-09-08   S: [address] [place] for medication management and medication assistance for Eliquis.  Patient is currently on eliquis for atrial fibrillation.  She is stable and tolerating medication.  She is now in the medicare coverage gap and experiencing very high copays. Insurance coverage/medication affordability:  [employer] medicare  Patient reports adherence with medications.  O: CBC    Component Value Date/Time   WBC 5.1 09/15/2020 0845   WBC 4.1 06/07/2020 1117   RBC 4.08 09/15/2020 0845   RBC 3.69 (L) 06/07/2020 1117   HGB 13.4 09/15/2020 0845   HCT 39.8 09/15/2020 0845   PLT 149 (L) 09/15/2020 0845   MCV 98 (H) 09/15/2020 0845   MCH 32.8 09/15/2020 0845   MCH 30.6 08/15/2017 1206   MCHC 33.7 09/15/2020 0845   MCHC 33.8 06/07/2020 1117   RDW 12.2 09/15/2020 0845   LYMPHSABS 1.4 09/15/2020 0845   MONOABS 0.5 06/07/2020 1117   EOSABS 0.1 09/15/2020 0845   BASOSABS 0.1 09/15/2020 0845   GFR 46 on 09/15/20   A/P:  Patient has been compliant with Eliquis, but now cost has increased due to coverage gap.  Information faxed to the health department to obtain medication assistance for Eliquis. Patient to follow up regarding this process.   Samples of Eliquis 5mg  given to patient to bridge supply (YFV#[id number], EXP 8/23)  Information about your medication: Anticoagulant  Generic Name (Brand): Apixaban (Eliquis)  Apixaban is used to reduce the risk of forming blood clots that cause a stroke due to an irregular heartbeat.  Common SIDE EFFECTS you may experience include: extremity pain and increased risk of bleeding or bruising.  This drug must be taken consistently as prescribed to maintain its effect.  Multiple drug-drug interactions exist for this medication.  Consult healthcare professional prior to starting a new drug.  Tell your physicians and dentists that you are taking this drug before elective surgery or  invasive procedures and before any new drug is prescribed.  Contact your health care provider if you experience: any signs of blood loss or unusual bleeding.    Patient instructions provided.  Total time in face to face counseling 25 minutes.     Regina Eck, PharmD, BCPS Clinical Pharmacist, [place]  [facility] Phone [phone]

## 2020-10-02 ENCOUNTER — Ambulatory Visit: Payer: Medicare Other

## 2020-10-08 ENCOUNTER — Other Ambulatory Visit: Payer: Self-pay | Admitting: Pulmonary Disease

## 2020-10-18 DIAGNOSIS — M5030 Other cervical disc degeneration, unspecified cervical region: Secondary | ICD-10-CM | POA: Diagnosis not present

## 2020-10-18 DIAGNOSIS — M5136 Other intervertebral disc degeneration, lumbar region: Secondary | ICD-10-CM | POA: Diagnosis not present

## 2020-10-18 DIAGNOSIS — M150 Primary generalized (osteo)arthritis: Secondary | ICD-10-CM | POA: Diagnosis not present

## 2020-10-18 DIAGNOSIS — R7989 Other specified abnormal findings of blood chemistry: Secondary | ICD-10-CM | POA: Diagnosis not present

## 2020-10-18 DIAGNOSIS — M0579 Rheumatoid arthritis with rheumatoid factor of multiple sites without organ or systems involvement: Secondary | ICD-10-CM | POA: Diagnosis not present

## 2020-10-24 NOTE — Progress Notes (Signed)
Cardiology Office Note   Date:  10/25/2020   ID:  Susan Fitzgerald, Susan Fitzgerald 03-21-1932, MRN [id number]  PCP:  [name], DO  Cardiologist:   Minus Breeding, MD   Chief Complaint  Patient presents with  . Shortness of Breath      History of Present Illness: Susan Fitzgerald is a 86 y.o. female who presents for evaluation of atrial fibrillation. She was cardioverted. However, she developed recurrent atrial fib. She failed flecainide.  She has been followed in the atrial fib clinic. She did describe chest pain and was sent for [facility] which did not suggest ischemia. She does have a mildly reduced EF (40 - 45% on echo.) Her last echo in March 2019 demonstrated an improved EF of 55 - 60%. She was started on amiodarone and underwent DCCV. She had increased dyspnea and decreased O2 sats. She was treated for pneumonia. She was referred to pulmonary. A CT was done. There was a thought that she might have an amiodarone pulmonary process. Other processes could not be excluded such as a reaction to her methotrexate. There was also suggestion of pulmonary edema. She has been treated with diuresis. She did have an increased creat but this is improved. She was treated with steroids and did improve.She has not been told that she has idiopathic idiopathicpulmonary fibrosis.  She had cardioversion again in March 2020 which she probably only helped sinus rhythm for about 4 weeks.  She does not really notice she felt remarkably different during that time.    Since I last saw her she has done well.  She almost golfed her age for 55 holes the other day.  She says her breathing is better.  She is having a little more stamina.  She denies any chest discomfort, neck or arm discomfort.  She has had no presyncope or syncope.  She does occasionally have some palpitations.   Past Medical History:  Diagnosis Date  . Calcification of bronchial airway 06/23/2018   01/2017 - See on  CT imaging of the chest   . Cataract   . Cerebral vascular disease 06/19/2018   06/19/2018 - MRI with chronic microvascular ischemic change  . Diverticulosis   . Hypertension   . Osteoporosis   . PAF (paroxysmal atrial fibrillation) ([place])    a. s/p prior DCCV; b. On flecainide & coumadin (CHA2DS2VASc = 4);  c. 03/2016 Echo: EF 55-60%, mod LVH, mod AI, mild to mod TR, PASP 97mmHg.  Marland Kitchen Pelvic fracture ([place])   . Pulmonary fibrosis ([place]) 2018  . Rheumatoid arthritis ([place]) 2017    Past Surgical History:  Procedure Laterality Date  . BIOPSY BREAST    . BIOPSY BREAST     right & benign  . BREAST SURGERY    . CARDIOVERSION N/A 10/30/2016   Procedure: CARDIOVERSION;  Surgeon: Larey Dresser, MD;  Location: [place];  Service: Cardiovascular;  Laterality: N/A;  . CARDIOVERSION N/A 08/20/2017   Procedure: CARDIOVERSION;  Surgeon: Jerline Pain, MD;  Location: [facility] ENDOSCOPY;  Service: Cardiovascular;  Laterality: N/A;  . CARDIOVERSION N/A 02/02/2020   Procedure: CARDIOVERSION;  Surgeon: Geralynn Rile, MD;  Location: [place];  Service: Cardiovascular;  Laterality: N/A;  . CATARACT EXTRACTION    . COLONOSCOPY    . EYE SURGERY     cataracts  . TEE WITHOUT CARDIOVERSION N/A 10/30/2016   Procedure: TRANSESOPHAGEAL ECHOCARDIOGRAM (TEE);  Surgeon: Larey Dresser, MD;  Location: [place];  Service: Cardiovascular;  Laterality: N/A;  . TOTAL HIP ARTHROPLASTY Right 01/27/2017   Procedure: TOTAL HIP ARTHROPLASTY ANTERIOR APPROACH;  Surgeon: Rod Can, MD;  Location: [place];  Service: Orthopedics;  Laterality: Right;     Current Outpatient Medications  Medication Sig Dispense Refill  . apixaban (ELIQUIS) 2.5 MG TABS tablet Take 1 tablet (2.5 mg total) by mouth 2 (two) times daily. 180 tablet 3  . azaTHIOprine (IMURAN) 50 MG tablet Take 50 mg by mouth daily.     . calcium-vitamin D (OSCAL WITH D) 250-125 MG-UNIT per tablet Take 1 tablet by mouth 2 (two) times daily.    Marland Kitchen diltiazem  (CARDIZEM CD) 300 MG 24 hr capsule Take 1 capsule (300 mg total) by mouth daily. 90 capsule 3  . ESBRIET 267 MG TABS TAKE 3 TABLETS (801MG ) BY  MOUTH THREE TIMES DAILY  WITH FOOD 270 tablet 0  . furosemide (LASIX) 20 MG tablet TAKE 1 TO 2 TABLETS BY  MOUTH DAILY AS NEEDED 180 tablet 0  . gabapentin (NEURONTIN) 100 MG capsule Take 100 mg by mouth at bedtime.     Marland Kitchen levothyroxine (SYNTHROID) 75 MCG tablet TAKE 1 TABLET BY MOUTH  DAILY 90 tablet 1  . lisinopril (ZESTRIL) 20 MG tablet TAKE 1 TABLET BY MOUTH  DAILY 90 tablet 3  . lisinopril-hydrochlorothiazide (ZESTORETIC) 20-12.5 MG tablet TAKE 1 TABLET BY MOUTH  DAILY IN THE MORNING 90 tablet 3  . Melatonin 5 MG TABS Take 5 mg by mouth at bedtime.    . Multiple Vitamin (MULTIVITAMIN) capsule Take 1 capsule by mouth daily.    . potassium chloride SA (KLOR-CON) 20 MEQ tablet TAKE 1 TABLET BY MOUTH  DAILY 90 tablet 3  . Probiotic Product (PROBIOTIC-10 ULTIMATE PO) Take 1 tablet by mouth daily.     Current Facility-Administered Medications  Medication Dose Route Frequency Provider Last Rate Last Admin  . cyanocobalamin ((VITAMIN B-12)) injection 1,000 mcg  1,000 mcg Intramuscular Q30 days Ronnie Doss M, DO   1,000 mcg at 10/25/20 1013    Allergies:   Benazepril hcl    ROS:  Please see the history of present illness.   Otherwise, review of systems are positive for none.   All other systems are reviewed and negative.    PHYSICAL EXAM: VS:  BP 130/78   Pulse 100   Ht 5' (1.524 m)   Wt 120 lb 6.4 oz (54.6 kg)   SpO2 97%   BMI 23.51 kg/m  , BMI Body mass index is 23.51 kg/m. GENERAL:  Well appearing NECK:  No jugular venous distention, waveform within normal limits, carotid upstroke brisk and symmetric, no bruits, no thyromegaly LUNGS:  Clear to auscultation bilaterally CHEST:  Unremarkable HEART:  PMI not displaced or sustained,S1 and S2 within normal limits, no S3, no S4, no clicks, no rubs, no murmurs ABD:  Flat, positive bowel  sounds normal in frequency in pitch, no bruits, no rebound, no guarding, no midline pulsatile mass, no hepatomegaly, no splenomegaly EXT:  2 plus pulses throughout, no edema, no cyanosis no clubbing   EKG:  EKG is  ordered today. Atrial fibrillation, rate 101, axis within normal limits, intervals within normal limits, no acute ST-T wave changes.  PVCs   Recent Labs: 06/07/2020: NT-Pro BNP 2,630 09/15/2020: ALT 11; BUN 26; Creatinine, Ser 1.09; Hemoglobin 13.4; Platelets 149; Potassium 3.7; Sodium 140; TSH 1.950    Lipid Panel    Component Value Date/Time   CHOL 172 09/15/2020 0845   CHOL 131  04/14/2013 0836   TRIG 59 09/15/2020 0845   TRIG 67 09/15/2017 0845   TRIG 69 04/14/2013 0836   HDL 79 09/15/2020 0845   HDL 71 09/15/2017 0845   HDL 53 04/14/2013 0836   CHOLHDL 2.2 09/15/2020 0845   LDLCALC 81 09/15/2020 0845   LDLCALC 64 04/14/2013 0836   LDLDIRECT 98 04/20/2014 1015      Wt Readings from Last 3 Encounters:  10/25/20 120 lb 6.4 oz (54.6 kg)  09/18/20 117 lb (53.1 kg)  06/07/20 117 lb 12.8 oz (53.4 kg)      Other studies Reviewed: Additional studies/ records that were reviewed today include:  Labs Review of the above records demonstrates: See elsewhere  ASSESSMENT AND PLAN:  HTN:    Her BP is slightly elevated but it is very well controlled, she brings me a diary.  No change in therapy.   ATRIAL FIB:   Ms. DARLETTA NOBLETT has a CHA2DS2 - VASc score of 4.   She tolerates anticoagulation or rate control.  No change in therapy.  DYSPNEA:   This is improved on the meds as listed.  She had a CT recently and I reviewed this.  There was mild interstitial lung disease.  She has followed by Dr. [name] Browner    Current medicines are reviewed at length with the patient today.  The patient does not have concerns regarding medicines.  The following changes have been made:  None  Labs/ tests ordered today include:  None  Orders Placed This Encounter  Procedures  . EKG  12-Lead     Disposition:   FU with me in 6 months.     Signed, Minus Breeding, MD  10/25/2020 11:30 AM    [facility]

## 2020-10-25 ENCOUNTER — Ambulatory Visit (INDEPENDENT_AMBULATORY_CARE_PROVIDER_SITE_OTHER): Payer: Medicare Other | Admitting: *Deleted

## 2020-10-25 ENCOUNTER — Telehealth: Payer: Self-pay

## 2020-10-25 ENCOUNTER — Encounter: Payer: Self-pay | Admitting: Cardiology

## 2020-10-25 ENCOUNTER — Ambulatory Visit: Payer: Medicare Other | Admitting: Cardiology

## 2020-10-25 ENCOUNTER — Other Ambulatory Visit: Payer: Self-pay

## 2020-10-25 VITALS — BP 130/78 | HR 100 | Ht 60.0 in | Wt 120.4 lb

## 2020-10-25 DIAGNOSIS — I482 Chronic atrial fibrillation, unspecified: Secondary | ICD-10-CM | POA: Diagnosis not present

## 2020-10-25 DIAGNOSIS — E538 Deficiency of other specified B group vitamins: Secondary | ICD-10-CM | POA: Diagnosis not present

## 2020-10-25 DIAGNOSIS — R0602 Shortness of breath: Secondary | ICD-10-CM

## 2020-10-25 DIAGNOSIS — I10 Essential (primary) hypertension: Secondary | ICD-10-CM | POA: Diagnosis not present

## 2020-10-25 NOTE — Telephone Encounter (Signed)
It is not a recommendation for women over 84 years old "The USPSTF recommends biennial screening mammography for women aged 35 to 47 years. "

## 2020-10-25 NOTE — Patient Instructions (Signed)
Medication Instructions:  The current medical regimen is effective;  continue present plan and medications.  *If you need a refill on your cardiac medications before your next appointment, please call your pharmacy*  Follow-Up: At [facility], you and your health needs are our priority.  As part of our continuing mission to provide you with exceptional heart care, we have created designated Provider Care Teams.  These Care Teams include your primary Cardiologist (physician) and Advanced Practice Providers (APPs -  Physician Assistants and Nurse Practitioners) who all work together to provide you with the care you need, when you need it.  We recommend signing up for the patient portal called "MyChart".  Sign up information is provided on this After Visit Summary.  MyChart is used to connect with patients for Virtual Visits (Telemedicine).  Patients are able to view lab/test results, encounter notes, upcoming appointments, etc.  Non-urgent messages can be sent to your provider as well.   To learn more about what you can do with MyChart, go to [email or web address].    Your next appointment:   6 month(s)  The format for your next appointment:   In Person  Provider:   James Hochrein, MD   Thank you for choosing [facility]!!     

## 2020-10-25 NOTE — Progress Notes (Signed)
B12 injection given and tolerated well.  

## 2020-10-25 NOTE — Telephone Encounter (Signed)
Please review and advise.

## 2020-10-25 NOTE — Telephone Encounter (Signed)
Patient notified and agreed.  

## 2020-11-01 ENCOUNTER — Ambulatory Visit (INDEPENDENT_AMBULATORY_CARE_PROVIDER_SITE_OTHER): Payer: Medicare Other

## 2020-11-01 DIAGNOSIS — Z0000 Encounter for general adult medical examination without abnormal findings: Secondary | ICD-10-CM | POA: Diagnosis not present

## 2020-11-01 NOTE — Progress Notes (Signed)
MEDICARE ANNUAL WELLNESS VISIT  11/01/2020  Telephone Visit Disclaimer This Medicare AWV was conducted by telephone due to national recommendations for restrictions regarding the COVID-19 Pandemic (e.g. social distancing).  I verified, using two identifiers, that I am speaking with Susan Fitzgerald or their authorized healthcare agent. I discussed the limitations, risks, security, and privacy concerns of performing an evaluation and management service by telephone and the potential availability of an in-person appointment in the future. The patient expressed understanding and agreed to proceed.  Location of Patient: Home Location of Provider (nurse):  [facility]  Subjective:    Susan Fitzgerald is a 86 y.o. female patient of [name], DO who had a Medicare Annual Wellness Visit today via telephone. Susan Fitzgerald is Retired and lives with their spouse. She has two children, four grandchildren and two great grandchildren, Her daughter passed  away in 2004 with breast cancer. She reports that she is socially active and does interact with friends/family regularly. She is moderately physically active and enjoys golfing and spending time with family and friends.  Patient Care Team: [name], DO as PCP - General (Family Medicine) Minus Breeding, MD as PCP - Cardiology (Cardiology) Druscilla Brownie, MD as Consulting Physician (Dermatology) Hennie Duos, MD as Consulting Physician (Rheumatology) Marlaine Hind, MD as Consulting Physician (Physical Medicine and Rehabilitation)  Advanced Directives 11/01/2020 12/06/2019 11/01/2019 10/28/2018 10/08/2018 08/20/2017 04/09/2017  Does Patient Have a Medical Advance Directive? No;Yes Yes Yes Yes Yes No No  Type of Paramedic of [place];Living will Living will;Healthcare Power of [place];Out of facility DNR (pink MOST or yellow form) [place];Living will [place] - -  Does patient want to make changes to medical advance directive? No - Patient declined - No - Patient declined No - Patient declined No - Patient declined - -  Copy of [place] in Chart? - - No - copy requested No - copy requested No - copy requested - -  Would patient like information on creating a medical advance directive? No - Patient declined - - - - No - Patient declined No - Patient declined  Pre-existing out of facility DNR order (yellow form or pink MOST form) - - - - - - -    Hospital Utilization Over the Past 12 Months: # of hospitalizations or ER visits: 0 # of surgeries: 0  Review of Systems    Patient reports that her overall health is better compared to last year.  History obtained from chart review and the patient  Patient Reported Readings (BP, Pulse, CBG, Weight, etc) 139/87, pulse 68  Pain Assessment Pain : No/denies pain     Current Medications & Allergies (verified) Allergies as of 11/01/2020      Reactions   Benazepril Hcl Cough      Medication List       Accurate as of November 01, 2020 10:38 AM. If you have any questions, ask your nurse or doctor.        apixaban 2.5 MG Tabs tablet Commonly known as: Eliquis Take 1 tablet (2.5 mg total) by mouth 2 (two) times daily.   azaTHIOprine 50 MG tablet Commonly known as: IMURAN Take 50 mg by mouth daily.   calcium-vitamin D 250-125 MG-UNIT tablet Commonly known as: OSCAL WITH D Take 1 tablet by mouth 2 (two) times daily.   diltiazem 300 MG 24 hr capsule Commonly known as: CARDIZEM CD  Take 1 capsule (300 mg total) by mouth daily.   Esbriet 267 MG Tabs Generic drug: Pirfenidone TAKE 3 TABLETS (801MG ) BY  MOUTH THREE TIMES DAILY  WITH FOOD   furosemide 20 MG tablet Commonly known as: LASIX TAKE 1 TO 2 TABLETS BY  MOUTH DAILY AS NEEDED   gabapentin 100 MG capsule Commonly known as: NEURONTIN Take 100 mg by mouth at bedtime.   levothyroxine 75 MCG  tablet Commonly known as: SYNTHROID TAKE 1 TABLET BY MOUTH  DAILY   lisinopril 20 MG tablet Commonly known as: ZESTRIL TAKE 1 TABLET BY MOUTH  DAILY What changed: when to take this   lisinopril-hydrochlorothiazide 20-12.5 MG tablet Commonly known as: ZESTORETIC TAKE 1 TABLET BY MOUTH  DAILY IN THE MORNING   melatonin 5 MG Tabs Take 5 mg by mouth at bedtime.   multivitamin capsule Take 1 capsule by mouth daily.   potassium chloride SA 20 MEQ tablet Commonly known as: KLOR-CON TAKE 1 TABLET BY MOUTH  DAILY   PROBIOTIC-10 ULTIMATE PO Take 1 tablet by mouth daily.       History (reviewed): Past Medical History:  Diagnosis Date   Calcification of bronchial airway 06/23/2018   01/2017 - See on CT imaging of the chest    Cataract    Cerebral vascular disease 06/19/2018   06/19/2018 - MRI with chronic microvascular ischemic change   Diverticulosis    Hypertension    Osteoporosis    PAF (paroxysmal atrial fibrillation) ([place])    a. s/p prior DCCV; b. On flecainide & coumadin (CHA2DS2VASc = 4);  c. 03/2016 Echo: EF 55-60%, mod LVH, mod AI, mild to mod TR, PASP 76mmHg.   Pelvic fracture ([facility])    Pulmonary fibrosis ([place]) 2018   Rheumatoid arthritis ([place]) 2017   Past Surgical History:  Procedure Laterality Date   BIOPSY BREAST     BIOPSY BREAST     right & benign   BREAST SURGERY     CARDIOVERSION N/A 10/30/2016   Procedure: CARDIOVERSION;  Surgeon: Larey Dresser, MD;  Location: [place];  Service: Cardiovascular;  Laterality: N/A;   CARDIOVERSION N/A 08/20/2017   Procedure: CARDIOVERSION;  Surgeon: Jerline Pain, MD;  Location: [place] ENDOSCOPY;  Service: Cardiovascular;  Laterality: N/A;   CARDIOVERSION N/A 02/02/2020   Procedure: CARDIOVERSION;  Surgeon: Geralynn Rile, MD;  Location: [place];  Service: Cardiovascular;  Laterality: N/A;   CATARACT EXTRACTION     COLONOSCOPY     EYE SURGERY     cataracts   TEE WITHOUT CARDIOVERSION N/A  10/30/2016   Procedure: TRANSESOPHAGEAL ECHOCARDIOGRAM (TEE);  Surgeon: Larey Dresser, MD;  Location: [place];  Service: Cardiovascular;  Laterality: N/A;   TOTAL HIP ARTHROPLASTY Right 01/27/2017   Procedure: TOTAL HIP ARTHROPLASTY ANTERIOR APPROACH;  Surgeon: Rod Can, MD;  Location: [place];  Service: Orthopedics;  Laterality: Right;   Family History  Problem Relation Age of Onset   Stroke Mother        cerebral hemorrhage   Heart disease Father        MI   Heart attack Father    Vision loss Father    Heart disease Brother    Heart attack Brother    Cancer Maternal Aunt        breast   Cancer Brother    Heart disease Brother    Cancer Daughter        breast   Colon cancer Neg Hx    Colon polyps Neg Hx  Rectal cancer Neg Hx    Stomach cancer Neg Hx    Social History   Socioeconomic History   Marital status: Married    Spouse name: Engineer, technical sales    Number of children: 2   Years of education: 12   Highest education level: Not on file  Occupational History   Not on file  Tobacco Use   Smoking status: Never Smoker   Smokeless tobacco: Never Used  Vaping Use   Vaping Use: Never used  Substance and Sexual Activity   Alcohol use: No   Drug use: No   Sexual activity: Yes  Other Topics Concern   Not on file  Social History Narrative   Not on file   Social Determinants of Health   Financial Resource Strain: Not on file  Food Insecurity: Not on file  Transportation Needs: Not on file  Physical Activity: Not on file  Stress: Not on file  Social Connections: Not on file    Activities of Daily Living In your present state of health, do you have any difficulty performing the following activities: 11/01/2020  Hearing? N  Vision? N  Difficulty concentrating or making decisions? N  Walking or climbing stairs? N  Dressing or bathing? N  Doing errands, shopping? N  Preparing Food and eating ? N  Using the Toilet? N  In the past six  months, have you accidently leaked urine? N  Do you have problems with loss of bowel control? N  Managing your Medications? N  Managing your Finances? N  Housekeeping or managing your Housekeeping? N  Some recent data might be hidden    Patient Education/ Literacy How often do you need to have someone help you when you read instructions, pamphlets, or other written materials from your doctor or pharmacy?: 1 - Never  Exercise Current Exercise Habits: Structured exercise class, Type of exercise: Other - see comments (patients plays golf frequently), Time (Minutes): 60, Frequency (Times/Week): 3, Weekly Exercise (Minutes/Week): 180, Intensity: Mild, Exercise limited by: orthopedic condition(s)  Diet Patient reports consuming 3 meals a day and 1 snack(s) a day Patient reports that her primary diet is: Regular Patient reports that she does have regular access to food.   Depression Screen PHQ 2/9 Scores 09/18/2020 12/13/2019 11/01/2019 08/09/2019 02/11/2019 01/21/2019 12/09/2018  PHQ - 2 Score 0 0 0 0 0 0 1  PHQ- 9 Score 0 - - 0 - 2 -  Exception Documentation - - - - - - -     Fall Risk Fall Risk  11/01/2020 09/18/2020 11/01/2019 08/09/2019 02/11/2019  Falls in the past year? 0 0 0 0 0  Number falls in past yr: - - - - -  Injury with Fall? - - - - -  Comment - - - - -  Risk for fall due to : - - - - -  Risk for fall due to: Comment - - - - -  Follow up Falls evaluation completed - - - -     Objective:  Susan Fitzgerald seemed alert and oriented and she participated appropriately during our telephone visit.  Blood Pressure Weight BMI  BP Readings from Last 3 Encounters:  10/25/20 130/78  09/18/20 129/82  06/07/20 130/68   Wt Readings from Last 3 Encounters:  10/25/20 120 lb 6.4 oz (54.6 kg)  09/18/20 117 lb (53.1 kg)  06/07/20 117 lb 12.8 oz (53.4 kg)   BMI Readings from Last 1 Encounters:  10/25/20 23.51 kg/m    *Unable to  obtain current vital signs, weight, and BMI due to  telephone visit type  Hearing/Vision   Susan Fitzgerald did not seem to have difficulty with hearing/understanding during the telephone conversation  Reports that she has had a formal eye exam by an eye care professional within the past year  Reports that she has not had a formal hearing evaluation within the past year *Unable to fully assess hearing and vision during telephone visit type  Cognitive Function: 6CIT Screen 11/01/2020 11/01/2019  What Year? 0 points 0 points  What month? 0 points 0 points  What time? 0 points 0 points  Count back from 20 0 points 0 points  Months in reverse 0 points 0 points  Repeat phrase 0 points 2 points  Total Score 0 2   (Normal:0-7, Significant for Dysfunction: >8)  Normal Cognitive Function Screening: Yes   Immunization & Health Maintenance Record Immunization History  Administered Date(s) Administered   Fluad Quad(high Dose 65+) 07/20/2019, 08/23/2020   Influenza, High Dose Seasonal PF 09/20/2016, 09/19/2017, 08/25/2018   Influenza,inj,Quad PF,6+ Mos 08/23/2013, 09/05/2014, 08/28/2015   PFIZER SARS-COV-2 Vaccination 12/24/2019, 01/19/2020   Pneumococcal Conjugate-13 09/05/2014   Pneumococcal Polysaccharide-23 10/28/2018   Td 09/02/2011   Tdap 09/02/2011   Zoster Recombinat (Shingrix) 11/13/2018, 03/15/2019    Health Maintenance  Topic Date Due   COVID-19 Vaccine (3 - Booster for [place] series) 07/21/2020   DEXA SCAN  09/29/2020   MAMMOGRAM  11/01/2020   TETANUS/TDAP  09/01/2021   INFLUENZA VACCINE  Completed   PNA vac Low Risk Adult  Completed       Assessment  This is a routine wellness examination for [employer].  Health Maintenance: Due or Overdue Health Maintenance Due  Topic Date Due   COVID-19 Vaccine (3 - Booster for [place] series) 07/21/2020   DEXA SCAN  09/29/2020   MAMMOGRAM  11/01/2020    Susan Fitzgerald does not need a referral for Community Assistance: Care Management:   no Social  Work:    no Prescription Assistance:  no Nutrition/Diabetes Education:  no   Plan:  Personalized Goals Goals Addressed            This Visit's Progress    Patient Stated       11/01/2020 AWV Goal: Fall Prevention   Over the next year, patient will decrease their risk for falls by: o Using assistive devices, such as a cane or walker, as needed o Identifying fall risks within their home and correcting them by: - Removing throw rugs - Adding handrails to stairs or ramps - Removing clutter and keeping a clear pathway throughout the home - Increasing light, especially at night - Adding shower handles/bars - Raising toilet seat o Identifying potential personal risk factors for falls: - Medication side effects - Incontinence/urgency - Vestibular dysfunction - Hearing loss - Musculoskeletal disorders - Neurological disorders - Orthostatic hypotension        Personalized Health Maintenance & Screening Recommendations  Td vaccine Bone densitometry screening  Lung Cancer Screening Recommended: no (Low Dose CT Chest recommended if Age 39-80 years, 30 pack-year currently smoking OR have quit w/in past 15 years) Hepatitis C Screening recommended: no HIV Screening recommended: no  Advanced Directives: Written information was not prepared per patient's request.  Referrals & Orders No orders of the defined types were placed in this encounter.   Follow-up Plan  Follow-up with [name], DO as planned  Schedule bone density screening    I have personally reviewed and  noted the following in the patients chart:    Medical and social history  Use of alcohol, tobacco or illicit drugs   Current medications and supplements  Functional ability and status  Nutritional status  Physical activity  Advanced directives  List of other physicians  Hospitalizations, surgeries, and ER visits in previous 12 months  Vitals  Screenings to include cognitive,  depression, and falls  Referrals and appointments  In addition, I have reviewed and discussed with Susan Fitzgerald certain preventive protocols, quality metrics, and best practice recommendations. A written personalized care plan for preventive services as well as general preventive health recommendations is available and can be mailed to the patient at her request.      Felicity Coyer, LPN    76/70/1100    Patient declined after visit summary.

## 2020-11-16 ENCOUNTER — Other Ambulatory Visit: Payer: Self-pay | Admitting: Pulmonary Disease

## 2020-11-22 ENCOUNTER — Telehealth: Payer: Self-pay | Admitting: Pulmonary Disease

## 2020-11-22 NOTE — Telephone Encounter (Signed)
Returned patient's call. Patient's PCP applied for [employer] for [employer] and was approved.  Patient was approved for [employer] for PF back in November and it expires on 09/05/21 by our office. This is currently being used to fill her medication through [employer]  Advised patient that [place]so Amy will rescind approval if she has both. Patient states she would like to continue filling through [facility] [employer] specialty and keep her [employer]. She will contact [place].  Patient will contact office if she has any issues or concerns. Nothing further needed at this time.

## 2020-11-27 ENCOUNTER — Other Ambulatory Visit: Payer: Self-pay | Admitting: Cardiology

## 2020-12-05 ENCOUNTER — Telehealth: Payer: Self-pay | Admitting: Cardiology

## 2020-12-05 NOTE — Telephone Encounter (Signed)
   Pt c/o medication issue:  1. Name of Medication:   CARTIA XT 300 MG 24 hr capsule   2. How are you currently taking this medication (dosage and times per day)? TAKE 1 CAPSULE BY MOUTH DAILY  3. Are you having a reaction (difficulty breathing--STAT)?   4. What is your medication issue? Pt said [facility]'t have a stock of this medication, she called her local pharmacy ([place]) and they said they have generic Diltiazem 300 mg (slow release). She wanted to ask Dr. Percival Spanish if its ok for her to switch to Diltiazem 300 mg (slow release), if yes please send prescription to [facility].

## 2020-12-06 NOTE — Telephone Encounter (Signed)
OK to substitute.

## 2020-12-08 MED ORDER — DILTIAZEM HCL ER COATED BEADS 300 MG PO CP24: 300.0000 mg | ORAL_CAPSULE | Freq: Every day | ORAL | 3 refills | Status: DC

## 2020-12-08 NOTE — Telephone Encounter (Signed)
Medication substituted per Dr. Percival Spanish. New prescription for Diltiazem sent to [facility]. Cartia XT discontinued.

## 2020-12-10 ENCOUNTER — Other Ambulatory Visit: Payer: Self-pay | Admitting: Family Medicine

## 2020-12-25 DIAGNOSIS — L821 Other seborrheic keratosis: Secondary | ICD-10-CM | POA: Diagnosis not present

## 2020-12-25 DIAGNOSIS — Z85828 Personal history of other malignant neoplasm of skin: Secondary | ICD-10-CM | POA: Diagnosis not present

## 2020-12-25 DIAGNOSIS — L820 Inflamed seborrheic keratosis: Secondary | ICD-10-CM | POA: Diagnosis not present

## 2020-12-25 DIAGNOSIS — L905 Scar conditions and fibrosis of skin: Secondary | ICD-10-CM | POA: Diagnosis not present

## 2020-12-25 DIAGNOSIS — L728 Other follicular cysts of the skin and subcutaneous tissue: Secondary | ICD-10-CM | POA: Diagnosis not present

## 2020-12-25 DIAGNOSIS — L570 Actinic keratosis: Secondary | ICD-10-CM | POA: Diagnosis not present

## 2020-12-25 DIAGNOSIS — D225 Melanocytic nevi of trunk: Secondary | ICD-10-CM | POA: Diagnosis not present

## 2020-12-25 DIAGNOSIS — L814 Other melanin hyperpigmentation: Secondary | ICD-10-CM | POA: Diagnosis not present

## 2020-12-29 ENCOUNTER — Ambulatory Visit (INDEPENDENT_AMBULATORY_CARE_PROVIDER_SITE_OTHER): Payer: Medicare Other

## 2020-12-29 ENCOUNTER — Other Ambulatory Visit: Payer: Self-pay

## 2020-12-29 DIAGNOSIS — E538 Deficiency of other specified B group vitamins: Secondary | ICD-10-CM | POA: Diagnosis not present

## 2020-12-29 NOTE — Progress Notes (Signed)
Cyanocobalamin injection given to left deltoid.  Patient tolerated well. 

## 2021-01-19 DIAGNOSIS — M0579 Rheumatoid arthritis with rheumatoid factor of multiple sites without organ or systems involvement: Secondary | ICD-10-CM | POA: Diagnosis not present

## 2021-01-19 DIAGNOSIS — Z79899 Other long term (current) drug therapy: Secondary | ICD-10-CM | POA: Diagnosis not present

## 2021-01-23 ENCOUNTER — Telehealth: Payer: Self-pay

## 2021-01-24 NOTE — Telephone Encounter (Signed)
Appt made

## 2021-01-30 ENCOUNTER — Ambulatory Visit (INDEPENDENT_AMBULATORY_CARE_PROVIDER_SITE_OTHER): Payer: Medicare Other

## 2021-01-30 ENCOUNTER — Telehealth: Payer: Self-pay

## 2021-01-30 ENCOUNTER — Other Ambulatory Visit: Payer: Self-pay

## 2021-01-30 DIAGNOSIS — E039 Hypothyroidism, unspecified: Secondary | ICD-10-CM

## 2021-01-30 DIAGNOSIS — Z780 Asymptomatic menopausal state: Secondary | ICD-10-CM

## 2021-01-30 DIAGNOSIS — M810 Age-related osteoporosis without current pathological fracture: Secondary | ICD-10-CM

## 2021-01-30 DIAGNOSIS — N1831 Chronic kidney disease, stage 3a: Secondary | ICD-10-CM

## 2021-01-30 DIAGNOSIS — Z7901 Long term (current) use of anticoagulants: Secondary | ICD-10-CM

## 2021-01-30 NOTE — Telephone Encounter (Signed)
Labs ordered.

## 2021-02-06 NOTE — Telephone Encounter (Signed)
No call back - this encounter will be closed.  

## 2021-02-24 ENCOUNTER — Other Ambulatory Visit: Payer: Self-pay | Admitting: Family Medicine

## 2021-02-26 NOTE — Telephone Encounter (Signed)
Last office visit 09/18/20 Last refill 01/21/20, #180, no refills

## 2021-03-12 ENCOUNTER — Other Ambulatory Visit: Payer: Self-pay | Admitting: Pulmonary Disease

## 2021-03-19 ENCOUNTER — Telehealth: Payer: Self-pay | Admitting: Pulmonary Disease

## 2021-03-19 ENCOUNTER — Other Ambulatory Visit: Payer: Medicare Other

## 2021-03-19 ENCOUNTER — Telehealth: Payer: Self-pay

## 2021-03-19 ENCOUNTER — Other Ambulatory Visit: Payer: Self-pay

## 2021-03-19 DIAGNOSIS — D696 Thrombocytopenia, unspecified: Secondary | ICD-10-CM

## 2021-03-19 DIAGNOSIS — M069 Rheumatoid arthritis, unspecified: Secondary | ICD-10-CM | POA: Diagnosis not present

## 2021-03-19 DIAGNOSIS — E039 Hypothyroidism, unspecified: Secondary | ICD-10-CM | POA: Diagnosis not present

## 2021-03-19 LAB — CMP14+EGFR
ALT: 11 IU/L (ref 0–32)
AST: 21 IU/L (ref 0–40)
Albumin/Globulin Ratio: 1.8 (ref 1.2–2.2)
Albumin: 4.1 g/dL (ref 3.6–4.6)
Alkaline Phosphatase: 81 IU/L (ref 44–121)
BUN/Creatinine Ratio: 22 (ref 12–28)
BUN: 24 mg/dL (ref 8–27)
Bilirubin Total: 0.4 mg/dL (ref 0.0–1.2)
CO2: 27 mmol/L (ref 20–29)
Calcium: 9.7 mg/dL (ref 8.7–10.3)
Chloride: 103 mmol/L (ref 96–106)
Creatinine, Ser: 1.09 mg/dL — ABNORMAL HIGH (ref 0.57–1.00)
Globulin, Total: 2.3 g/dL (ref 1.5–4.5)
Glucose: 81 mg/dL (ref 65–99)
Potassium: 4 mmol/L (ref 3.5–5.2)
Sodium: 144 mmol/L (ref 134–144)
Total Protein: 6.4 g/dL (ref 6.0–8.5)
eGFR: 49 mL/min/{1.73_m2} — ABNORMAL LOW (ref >59)

## 2021-03-19 LAB — CBC WITH DIFFERENTIAL/PLATELET
Basophils Absolute: 0 10*3/uL (ref 0.0–0.2)
Basos: 1 %
EOS (ABSOLUTE): 0.2 10*3/uL (ref 0.0–0.4)
Eos: 3 %
Hematocrit: 38.9 % (ref 34.0–46.6)
Hemoglobin: 13.3 g/dL (ref 11.1–15.9)
Immature Grans (Abs): 0 10*3/uL (ref 0.0–0.1)
Immature Granulocytes: 0 %
Lymphocytes Absolute: 1.2 10*3/uL (ref 0.7–3.1)
Lymphs: 22 %
MCH: 33.8 pg — ABNORMAL HIGH (ref 26.6–33.0)
MCHC: 34.2 g/dL (ref 31.5–35.7)
MCV: 99 fL — ABNORMAL HIGH (ref 79–97)
Monocytes Absolute: 0.7 10*3/uL (ref 0.1–0.9)
Monocytes: 13 %
Neutrophils Absolute: 3.4 10*3/uL (ref 1.4–7.0)
Neutrophils: 61 %
Platelets: 138 10*3/uL — ABNORMAL LOW (ref 150–450)
RBC: 3.94 x10E6/uL (ref 3.77–5.28)
RDW: 12.2 % (ref 11.7–15.4)
WBC: 5.6 10*3/uL (ref 3.4–10.8)

## 2021-03-19 NOTE — Telephone Encounter (Signed)
Pt aware - already came in

## 2021-03-19 NOTE — Telephone Encounter (Signed)
Called and spoke with patient who states that her granddaughter was around her yesterday and she tested positive for Covid-19 today. Patient is wanting to know what/if there is anything she should do. Patient stated that she does not have any symptoms and feels fine and she is fully vaccinated as well. Did provide the patient with the phone number to get a Covid test in the meantime; [phone]. Advised patient that she does not need to get tested unless she starts having symptoms. Told her to use good judgement the next 5 days or so if going out or doing things incase she does start to develop symptoms to not expose anyone. Also, patient wanted to know if Dr. [name] Browner recommends her getting her second booster shot. Advised patient that Dr. [name] Browner is working nights the next couple days and that it would probably be tomorrow before she heard back from [place]. She expressed understanding.   Dr. [name] Browner please advise

## 2021-03-19 NOTE — Telephone Encounter (Signed)
Pt stated that her granddaughter was around her yesterday and she tested positive for Covid-19 today and the pt is wanting to know what advice to do. Pt stated that she does not have any symptoms and feels fine and she is fully vaccinated as well. Did provide the pt with the phone number to get a Covid test in the meantime; [phone]. Also, pt wanted to know if Dr. [name] Browner recommends her getting her second booster shot. Pls regard; [phone]

## 2021-03-20 ENCOUNTER — Ambulatory Visit: Payer: Medicare Other | Admitting: Family Medicine

## 2021-03-20 NOTE — Telephone Encounter (Signed)
Called and spoke with patient about Dr Matilde Bash recommendations. Patient states she is continues to have no symptoms currently and feels fine. Patient expressed full understanding of Dr Matilde Bash recommendations and stated she will call us if she tests positive. Nothing further needed at this time.

## 2021-03-20 NOTE — Telephone Encounter (Signed)
Please have her call us if she tests postive and we can refer for outpatient therapies such as antibodies or pills.

## 2021-03-23 ENCOUNTER — Encounter: Payer: Self-pay | Admitting: Family Medicine

## 2021-03-23 ENCOUNTER — Ambulatory Visit (INDEPENDENT_AMBULATORY_CARE_PROVIDER_SITE_OTHER): Payer: Medicare Other | Admitting: Family Medicine

## 2021-03-23 ENCOUNTER — Other Ambulatory Visit: Payer: Self-pay

## 2021-03-23 VITALS — BP 120/65 | HR 101 | Temp 98.1°F | Ht 60.0 in | Wt 120.8 lb

## 2021-03-23 DIAGNOSIS — I482 Chronic atrial fibrillation, unspecified: Secondary | ICD-10-CM | POA: Diagnosis not present

## 2021-03-23 DIAGNOSIS — E538 Deficiency of other specified B group vitamins: Secondary | ICD-10-CM | POA: Diagnosis not present

## 2021-03-23 DIAGNOSIS — N1831 Chronic kidney disease, stage 3a: Secondary | ICD-10-CM

## 2021-03-23 DIAGNOSIS — I700 Atherosclerosis of aorta: Secondary | ICD-10-CM | POA: Diagnosis not present

## 2021-03-23 DIAGNOSIS — M5136 Other intervertebral disc degeneration, lumbar region: Secondary | ICD-10-CM

## 2021-03-23 DIAGNOSIS — E039 Hypothyroidism, unspecified: Secondary | ICD-10-CM

## 2021-03-23 DIAGNOSIS — J849 Interstitial pulmonary disease, unspecified: Secondary | ICD-10-CM

## 2021-03-23 MED ORDER — ONDANSETRON 4 MG PO TBDP: 4.0000 mg | ORAL_TABLET | Freq: Three times a day (TID) | ORAL | 0 refills | Status: DC | PRN

## 2021-03-23 NOTE — Progress Notes (Signed)
Subjective: CC: Hypothyroidism PCP: [name], DO OBS:JGGEZ S Kluge is a 87 y.o. female presenting to clinic today for:  1. Hypothyroidism Compliant with thyroid medication.  Takes appropriately separated from her calcium and vitamin D by at least 4 hours.  No reports of change in voice, difficulty swallowing, tremor  2. Rheumatoid arthritis/ Low back pain Continues to see Dr. Amil Amen as directed for rheumatoid arthritis.  She does admit that she has difficulty with low back pain that is primarily left-sided but occasionally happens on the right side as well.  This is worse with certain activities where she is in a standing position and seems to be slightly relieved by sitting.  She does not report any falls, has changes in sensation of the lower extremities.  She does have a history of degenerative changes in the back such that she is to see a neurosurgeon in [place] and get back injections.  The back injections were especially helpful but she worries about progression of her back and would like to get imaging to reevaluate  3.  Ear fullness Patient would like to get her ears checked out see if they need to be cleaned ROS: Per HPI  Allergies  Allergen Reactions  . Benazepril Hcl Cough   Past Medical History:  Diagnosis Date  . Calcification of bronchial airway 06/23/2018   01/2017 - See on CT imaging of the chest   . Cataract   . Cerebral vascular disease 06/19/2018   06/19/2018 - MRI with chronic microvascular ischemic change  . Diverticulosis   . Hypertension   . Osteoporosis   . PAF (paroxysmal atrial fibrillation) ([place])    a. s/p prior DCCV; b. On flecainide & coumadin (CHA2DS2VASc = 4);  c. 03/2016 Echo: EF 55-60%, mod LVH, mod AI, mild to mod TR, PASP 78mmHg.  Marland Kitchen Pelvic fracture ([place])   . Pulmonary fibrosis ([place]) 2018  . Rheumatoid arthritis ([place]) 2017    Current Outpatient Medications:  .  furosemide (LASIX) 20 MG tablet, TAKE 1 TO 2 TABLETS BY  MOUTH DAILY AS  NEEDED, Disp: 180 tablet, Rfl: 3 .  apixaban (ELIQUIS) 2.5 MG TABS tablet, Take 1 tablet (2.5 mg total) by mouth 2 (two) times daily., Disp: 180 tablet, Rfl: 3 .  azaTHIOprine (IMURAN) 50 MG tablet, Take 50 mg by mouth daily. , Disp: , Rfl:  .  calcium-vitamin D (OSCAL WITH D) 250-125 MG-UNIT per tablet, Take 1 tablet by mouth 2 (two) times daily., Disp: , Rfl:  .  diltiazem (CARDIZEM CD) 300 MG 24 hr capsule, Take 1 capsule (300 mg total) by mouth daily., Disp: 90 capsule, Rfl: 3 .  ESBRIET 267 MG TABS, TAKE 3 TABLETS (801MG ) BY  MOUTH THREE TIMES DAILY  WITH FOOD, Disp: 270 tablet, Rfl: 3 .  gabapentin (NEURONTIN) 100 MG capsule, Take 100 mg by mouth at bedtime. , Disp: , Rfl:  .  levothyroxine (SYNTHROID) 75 MCG tablet, TAKE 1 TABLET BY MOUTH  DAILY, Disp: 90 tablet, Rfl: 2 .  lisinopril (ZESTRIL) 20 MG tablet, TAKE 1 TABLET BY MOUTH  DAILY (Patient taking differently: Take 20 mg by mouth at bedtime.), Disp: 90 tablet, Rfl: 3 .  lisinopril-hydrochlorothiazide (ZESTORETIC) 20-12.5 MG tablet, TAKE 1 TABLET BY MOUTH  DAILY IN THE MORNING, Disp: 90 tablet, Rfl: 3 .  Melatonin 5 MG TABS, Take 5 mg by mouth at bedtime., Disp: , Rfl:  .  Multiple Vitamin (MULTIVITAMIN) capsule, Take 1 capsule by mouth daily., Disp: , Rfl:  .  potassium chloride SA (KLOR-CON) 20 MEQ tablet, TAKE 1 TABLET BY MOUTH  DAILY, Disp: 90 tablet, Rfl: 3 .  Probiotic Product (PROBIOTIC-10 ULTIMATE PO), Take 1 tablet by mouth daily., Disp: , Rfl:   Current Facility-Administered Medications:  .  cyanocobalamin ((VITAMIN B-12)) injection 1,000 mcg, 1,000 mcg, Intramuscular, Q30 days, [facility] [place] M, DO, 1,000 mcg at 12/29/20 1037 Social History   Socioeconomic History  . Marital status: Married    Spouse name: Rush Landmark   . Number of children: 2  . Years of education: 22  . Highest education level: Not on file  Occupational History  . Not on file  Tobacco Use  . Smoking status: Never Smoker  . Smokeless tobacco: Never  Used  Vaping Use  . Vaping Use: Never used  Substance and Sexual Activity  . Alcohol use: No  . Drug use: No  . Sexual activity: Yes  Other Topics Concern  . Not on file  Social History Narrative  . Not on file   Social Determinants of Health   Financial Resource Strain: Not on file  Food Insecurity: Not on file  Transportation Needs: Not on file  Physical Activity: Not on file  Stress: Not on file  Social Connections: Not on file  Intimate Partner Violence: Not on file   Family History  Problem Relation Age of Onset  . Stroke Mother        cerebral hemorrhage  . Heart disease Father        MI  . Heart attack Father   . Vision loss Father   . Heart disease Brother   . Heart attack Brother   . Cancer Maternal Aunt        breast  . Cancer Brother   . Heart disease Brother   . Cancer Daughter        breast  . Colon cancer Neg Hx   . Colon polyps Neg Hx   . Rectal cancer Neg Hx   . Stomach cancer Neg Hx     Objective: Office vital signs reviewed. BP 120/65   Pulse (!) 101   Temp 98.1 F (36.7 C)   Ht 5' (1.524 m)   Wt 120 lb 12.8 oz (54.8 kg)   SpO2 96%   BMI 23.59 kg/m   Physical Examination:  General: Awake, alert, well nourished, No acute distress HEENT: Normal; left ear with cerumen impaction obscuring TM visualization Cardio: regular rate and rhythm, S1S2 heard, no murmurs appreciated Pulm: clear to auscultation bilaterally, no wheezes, rhonchi or rales; normal work of breathing on room air Extremities: warm, well perfused, No edema, cyanosis or clubbing; +2 pulses bilaterally MSK: normal gait and hunched station  Thoracic spine: Thoracolumbar scoliosis noted with the right a rib hump.  Lumbar spine: Scoliosis as above but no midline tenderness palpation.    Assessment/ Plan: 87 y.o. female   Acquired hypothyroidism  Atrial fibrillation, chronic (HCC)  Aortic atherosclerosis (HCC)  Stage 3a chronic kidney disease ([place])  Interstitial  pulmonary disease (HCC)  Degenerative disc disease, lumbar - Plan: MR Lumbar Spine Wo Contrast  TSH and free T4 added to labs.  Unsure why these were not collected.  We reviewed the remainder of her labs today.  Mildly elevated MCV suggesting ongoing B12 deficiency.  B12 injection was administered.  She has follow-up scheduled with hematology for ongoing low platelets.  Uncertain etiology as I am not sure that she is on any medications which would cause.  She is chronically anticoagulated which  I would like to make sure that we can do everything possible to make sure her platelets stay within normal range to ensure adequate coagulation if she sustains a cut or injury  Zofran renewed for as needed use given medication needed for interstitial pulmonary disease.  Discussed sparing use  MRI of lumbar spine ordered given degenerative disc disease that has been refractory to back injections in the past.  Pending results I anticipate we will refer her back to her neurosurgeon in [place].  No orders of the defined types were placed in this encounter.  No orders of the defined types were placed in this encounter.    [name], DO [place] [phone]

## 2021-03-27 ENCOUNTER — Telehealth: Payer: Self-pay

## 2021-03-27 LAB — TSH: TSH: 1.42 u[IU]/mL (ref 0.450–4.500)

## 2021-03-27 LAB — T4, FREE: Free T4: 1.44 ng/dL (ref 0.82–1.77)

## 2021-03-27 LAB — SPECIMEN STATUS REPORT

## 2021-03-27 NOTE — Telephone Encounter (Signed)
Patient would like to know status of her referral for an MRI.  The referral number is [id number].

## 2021-03-28 ENCOUNTER — Ambulatory Visit: Payer: Medicare Other

## 2021-03-28 NOTE — Telephone Encounter (Signed)
Patient states that she is having a lot of pain in her upper back as well and would like that evaluated as well. She states that the pain in her upper back at times causes her to double over.

## 2021-03-28 NOTE — Telephone Encounter (Signed)
Patient aware.

## 2021-03-28 NOTE — Telephone Encounter (Signed)
So she had stated that she had pain in multiple areas and I explained to her that we needed a medical indication to perform studies and that pan-MRI is not feasible.  Would start with lumbar MRI and see spinal specialist as we discussed.

## 2021-03-28 NOTE — Telephone Encounter (Signed)
Pt wants both her lumbar and upper back checked on the mri. She said the imaging place said she would need new order because they have order for lumbar only.

## 2021-03-29 ENCOUNTER — Telehealth: Payer: Self-pay

## 2021-03-29 ENCOUNTER — Telehealth: Payer: Self-pay | Admitting: Pulmonary Disease

## 2021-03-29 DIAGNOSIS — Z20822 Contact with and (suspected) exposure to covid-19: Secondary | ICD-10-CM | POA: Diagnosis not present

## 2021-03-29 DIAGNOSIS — U071 COVID-19: Secondary | ICD-10-CM

## 2021-03-29 NOTE — Telephone Encounter (Signed)
Yes. She will need a COVID PCR test today as home tests may not always pick up the infection. She can also repeat the home test. Will need referral to outpatient Covid therapies if positive. Please tell her to keep [place] updated

## 2021-03-29 NOTE — Telephone Encounter (Signed)
Called and spoke with patient. I helped her to find a covid testing site near her. [facility] [place] [facility] not [facility] testing anymore so she would have to go to the [place] location. I was able to get her scheduled for the [place] location today at 1240pm. She is aware that she will receive a confirmation text. She is also aware to call us back after she receives her covid test results.   Nothing further needed.

## 2021-03-29 NOTE — Telephone Encounter (Signed)
Called and spoke with patient who states that she has a dry cough, temperature ranging from 99.9-102.6, states that at 6 am her temp was 102.6 and a headache. Symptoms started 3 days ago. Did home test this morning for Covid her'scame back negative, but husband's was positive. Pt has pulmonary fibrosis and wants advice as to if she should test again or if there are any other recommendations.   Dr. [name] Browner please advise

## 2021-03-29 NOTE — Telephone Encounter (Signed)
Patient called in to about covid testing. Patient states that she did a test at home on her spouse and it was positive. Patient was looking for a place that offers rapid testing. Patient advise that are test are not rapid. Patient states that she will do the home test and contact her provider if it comes back positive this morning to see about the antibiotic treatment.

## 2021-03-30 ENCOUNTER — Telehealth: Payer: Self-pay | Admitting: Pulmonary Disease

## 2021-03-30 NOTE — Telephone Encounter (Signed)
Called by [facility] who reports [facility] She reports a 3 day history of cough and fever to 102.6 once. She has a history of AFIB and Pulmonary Fibrosis Ideally, she should be started on Paxlovid or Molnupiravir. However, she gets her drugs via mail order and has no local pharmacies that are open at this time. I advised her to call the PCCM office in the morning so that the on-call physician can phone in a prescription to her local pharmacy. Should her breathing deteriorate further, she was instructed to go the the Emergency Department for further evaluation and treatment.

## 2021-03-31 NOTE — Telephone Encounter (Signed)
Called by patient regarding confirmatory COVID PCR being positive. Patient double vaccinated and boosted. Symptoms stable. Placed consult for OP COVID treatment Told her our office would reach out Monday but to go to ER if symptoms worsen. Will ask Dr. [name] Browner if he would like anything else done.  Erskine Emery MD PCCM

## 2021-03-31 NOTE — Addendum Note (Signed)
Addended by: Ina Homes on: 03/31/2021 08:30 AM   Modules accepted: Orders

## 2021-04-01 ENCOUNTER — Other Ambulatory Visit: Payer: Self-pay | Admitting: Nurse Practitioner

## 2021-04-01 ENCOUNTER — Encounter: Payer: Self-pay | Admitting: Nurse Practitioner

## 2021-04-01 DIAGNOSIS — I10 Essential (primary) hypertension: Secondary | ICD-10-CM

## 2021-04-01 DIAGNOSIS — I4819 Other persistent atrial fibrillation: Secondary | ICD-10-CM

## 2021-04-01 DIAGNOSIS — U071 COVID-19: Secondary | ICD-10-CM

## 2021-04-01 DIAGNOSIS — R059 Cough, unspecified: Secondary | ICD-10-CM

## 2021-04-01 MED ORDER — MOLNUPIRAVIR EUA 200MG CAPSULE: 4.0000 | ORAL_CAPSULE | Freq: Two times a day (BID) | ORAL | 0 refills | Status: AC

## 2021-04-01 NOTE — Progress Notes (Signed)
Outpatient Oral COVID Treatment Note  I connected with Susan Fitzgerald on 04/01/2021/5:42 PM by telephone and verified that I am speaking with the correct person using two identifiers.  I discussed the limitations, risks, security, and privacy concerns of performing an evaluation and management service by telephone and the availability of in person appointments. I also discussed with the patient that there may be a patient responsible charge related to this service. The patient expressed understanding and agreed to proceed.  Patient location: home  Provider location: office   Diagnosis: COVID-19 infection  Purpose of visit: Discussion of potential use of Molnupiravir or Paxlovid, a new treatment for mild to moderate COVID-19 viral infection in non-hospitalized patients.   Subjective: Patient is a 87 y.o. female who has been diagnosed with COVID 19 viral infection.  Their symptoms began on 04/05/2021 with cough, congestion, low-grade fever.     Past Medical History:  Diagnosis Date  . Calcification of bronchial airway 06/23/2018   01/2017 - See on CT imaging of the chest   . Cataract   . Cerebral vascular disease 06/19/2018   06/19/2018 - MRI with chronic microvascular ischemic change  . Diverticulosis   . Hypertension   . Osteoporosis   . PAF (paroxysmal atrial fibrillation) ([place])    a. s/p prior DCCV; b. On flecainide & coumadin (CHA2DS2VASc = 4);  c. 03/2016 Echo: EF 55-60%, mod LVH, mod AI, mild to mod TR, PASP 26mHg.  .[place]   . [place]  . [place]    Allergies  Allergen Reactions  . Benazepril Hcl Cough     Current Outpatient Medications:  .  apixaban (ELIQUIS) 2.5 MG TABS tablet, Take 1 tablet (2.5 mg total) by mouth 2 (two) times daily., Disp: 180 tablet, Rfl: 3 .  azaTHIOprine (IMURAN) 50 MG tablet, Take 50 mg by mouth daily. , Disp: , Rfl:  .  calcium-vitamin D (OSCAL WITH D) 250-125 MG-UNIT per tablet, Take 1 tablet  by mouth 2 (two) times daily., Disp: , Rfl:  .  diltiazem (CARDIZEM CD) 300 MG 24 hr capsule, Take 1 capsule (300 mg total) by mouth daily., Disp: 90 capsule, Rfl: 3 .  ESBRIET 267 MG TABS, TAKE 3 TABLETS (801MG) BY  MOUTH THREE TIMES DAILY  WITH FOOD, Disp: 270 tablet, Rfl: 3 .  furosemide (LASIX) 20 MG tablet, TAKE 1 TO 2 TABLETS BY  MOUTH DAILY AS NEEDED, Disp: 180 tablet, Rfl: 3 .  gabapentin (NEURONTIN) 100 MG capsule, Take 100 mg by mouth at bedtime. , Disp: , Rfl:  .  levothyroxine (SYNTHROID) 75 MCG tablet, TAKE 1 TABLET BY MOUTH  DAILY, Disp: 90 tablet, Rfl: 2 .  lisinopril (ZESTRIL) 20 MG tablet, TAKE 1 TABLET BY MOUTH  DAILY (Patient taking differently: Take 20 mg by mouth at bedtime.), Disp: 90 tablet, Rfl: 3 .  lisinopril-hydrochlorothiazide (ZESTORETIC) 20-12.5 MG tablet, TAKE 1 TABLET BY MOUTH  DAILY IN THE MORNING, Disp: 90 tablet, Rfl: 3 .  Melatonin 5 MG TABS, Take 5 mg by mouth at bedtime., Disp: , Rfl:  .  Multiple Vitamin (MULTIVITAMIN) capsule, Take 1 capsule by mouth daily., Disp: , Rfl:  .  ondansetron (ZOFRAN ODT) 4 MG disintegrating tablet, Take 1 tablet (4 mg total) by mouth every 8 (eight) hours as needed for nausea or vomiting., Disp: 20 tablet, Rfl: 0 .  potassium chloride SA (KLOR-CON) 20 MEQ tablet, TAKE 1 TABLET BY MOUTH  DAILY, Disp: 90 tablet, Rfl: 3 .  Probiotic Product (PROBIOTIC-10 ULTIMATE PO), Take 1 tablet by mouth daily., Disp: , Rfl:   Current Facility-Administered Medications:  .  cyanocobalamin ((VITAMIN B-12)) injection 1,000 mcg, 1,000 mcg, Intramuscular, Q30 days, [place]sch[place], Ashly M, DO, 1,000 mcg at 03/23/21 1054  Objective: Patient appears/sounds congestion. They are in no apparent distress.  Breathing is non labored.  Mood and behavior are normal.  Laboratory Data:  Recent Results (from the past 2160 hour(s))  CBC with Differential/Platelet     Status: Abnormal   Collection Time: 03/19/21  9:01 AM  Result Value Ref Range   WBC 5.6 3.4 - 10.8  x10E3/uL   RBC 3.94 3.77 - 5.28 x10E6/uL   Hemoglobin 13.3 11.1 - 15.9 g/dL   Hematocrit 38.9 34.0 - 46.6 %   MCV 99 (H) 79 - 97 fL   MCH 33.8 (H) 26.6 - 33.0 pg   MCHC 34.2 31.5 - 35.7 g/dL   RDW 12.2 11.7 - 15.4 %   Platelets 138 (L) 150 - 450 x10E3/uL   Neutrophils 61 Not Estab. %   Lymphs 22 Not Estab. %   Monocytes 13 Not Estab. %   Eos 3 Not Estab. %   Basos 1 Not Estab. %   Neutrophils Absolute 3.4 1.4 - 7.0 x10E3/uL   Lymphocytes Absolute 1.2 0.7 - 3.1 x10E3/uL   Monocytes Absolute 0.7 0.1 - 0.9 x10E3/uL   EOS (ABSOLUTE) 0.2 0.0 - 0.4 x10E3/uL   Basophils Absolute 0.0 0.0 - 0.2 x10E3/uL   Immature Granulocytes 0 Not Estab. %   Immature Grans (Abs) 0.0 0.0 - 0.1 x10E3/uL  CMP14+EGFR     Status: Abnormal   Collection Time: 03/19/21  9:01 AM  Result Value Ref Range   Glucose 81 65 - 99 mg/dL   BUN 24 8 - 27 mg/dL   Creatinine, Ser 1.09 (H) 0.57 - 1.00 mg/dL   eGFR 49 (L) >59 mL/min/1.73   BUN/Creatinine Ratio 22 12 - 28   Sodium 144 134 - 144 mmol/L   Potassium 4.0 3.5 - 5.2 mmol/L   Chloride 103 96 - 106 mmol/L   CO2 27 20 - 29 mmol/L   Calcium 9.7 8.7 - 10.3 mg/dL   Total Protein 6.4 6.0 - 8.5 g/dL   Albumin 4.1 3.6 - 4.6 g/dL   Globulin, Total 2.3 1.5 - 4.5 g/dL   Albumin/Globulin Ratio 1.8 1.2 - 2.2   Bilirubin Total 0.4 0.0 - 1.2 mg/dL   Alkaline Phosphatase 81 44 - 121 IU/L   AST 21 0 - 40 IU/L   ALT 11 0 - 32 IU/L  TSH     Status: None   Collection Time: 03/19/21  9:01 AM  Result Value Ref Range   TSH 1.420 0.450 - 4.500 uIU/mL  T4, free     Status: None   Collection Time: 03/19/21  9:01 AM  Result Value Ref Range   Free T4 1.44 0.82 - 1.77 ng/dL  Specimen status report     Status: None   Collection Time: 03/19/21  9:01 AM  Result Value Ref Range   specimen status report Comment     Comment: Written Authorization Written Authorization Written Authorization Received. Authorization received from [facility] 03-26-2021 Logged by Leona Carry      Assessment: 87 y.o. female with mild/moderate COVID 19 viral infection diagnosed on 03/31/2021 at high risk for progression to severe COVID 19.  Plan:  This patient is a 87 y.o. female that meets the following criteria for Emergency Use Authorization  of: Molnupiravir  1. Age >18 yr 2. SARS-COV-2 positive test 3. Symptom onset < 5 days 4. Mild-to-moderate COVID disease with high risk for severe progression to hospitalization or death   I have spoken and communicated the following to the patient or parent/caregiver regarding: 1. Molnupiravir is an unapproved drug that is authorized for use under an [employer].  2. There are no adequate, approved, available products for the treatment of COVID-19 in adults who have mild-to-moderate COVID-19 and are at high risk for progressing to severe COVID-19, including hospitalization or death. 3. Other therapeutics are currently authorized. For additional information on all products authorized for treatment or prevention of COVID-19, please see [employer].  4. There are benefits and risks of taking this treatment as outlined in the "Fact Sheet for Patients and Caregivers."  5. "Fact Sheet for Patients and Caregivers" was reviewed with patient. A hard copy will be provided to patient from pharmacy prior to the patient receiving treatment. 6. Patients should continue to self-isolate and use infection control measures (e.g., wear mask, isolate, social distance, avoid sharing personal items, clean and disinfect "high touch" surfaces, and frequent handwashing) according to CDC guidelines.  7. The patient or parent/caregiver has the option to accept or refuse treatment. 8. [employer] has established a pregnancy surveillance program. 9. Females of childbearing potential should use a reliable method of contraception  correctly and consistently, as applicable, for the duration of treatment and for 4 days after the last dose of Molnupiravir. 6. Males of reproductive potential who are sexually active with females of childbearing potential should use a reliable method of contraception correctly and consistently during treatment and for at least 3 months after the last dose. 11. Pregnancy status and risk was assessed. Patient verbalized understanding of precautions.   After reviewing above information with the patient, the patient agrees to receive molnupiravir.  Follow up instructions:    . Take prescription BID x 5 days as directed . Reach out to pharmacist for counseling on medication if desired . For concerns regarding further COVID symptoms please follow up with your PCP or urgent care . For urgent or life-threatening issues, seek care at your local emergency department  The patient was provided an opportunity to ask questions, and all were answered. The patient agreed with the plan and demonstrated an understanding of the instructions.   Script sent to [facility] and opted to pick up RX.  The patient was advised to call their PCP or seek an in-person evaluation if the symptoms worsen or if the condition fails to improve as anticipated.   I provided 20  minutes of non face-to-face telephone visit time during this encounter, and > 50% was spent counseling as documented under my assessment & plan.  Jobe Gibbon, NP 04/01/2021 /5:42 PM

## 2021-04-09 ENCOUNTER — Other Ambulatory Visit: Payer: Self-pay

## 2021-04-09 ENCOUNTER — Telehealth: Payer: Self-pay | Admitting: Family Medicine

## 2021-04-09 ENCOUNTER — Ambulatory Visit (INDEPENDENT_AMBULATORY_CARE_PROVIDER_SITE_OTHER): Payer: Medicare Other | Admitting: Family Medicine

## 2021-04-09 DIAGNOSIS — F418 Other specified anxiety disorders: Secondary | ICD-10-CM

## 2021-04-09 MED ORDER — LORAZEPAM 0.5 MG PO TABS: ORAL_TABLET | ORAL | 0 refills | Status: DC

## 2021-04-09 NOTE — Telephone Encounter (Signed)
Please put in the 630 after hours slot. Prefer video visit but can do televisit if does not have smart phone

## 2021-04-09 NOTE — Progress Notes (Signed)
Telephone visit  Subjective: CC: situational PCP: [name], DO NTZ:GYFVC Susan Fitzgerald is a 87 y.o. female calls for telephone consult today. Patient provides verbal consent for consult held via phone.  Due to COVID-19 pandemic this visit was conducted virtually. This visit type was conducted due to national recommendations for restrictions regarding the COVID-19 Pandemic (e.g. social distancing, sheltering in place) in an effort to limit this patient's exposure and mitigate transmission in our community. All issues noted in this document were discussed and addressed.  A physical exam was not performed with this format.   Location of patient: home Location of provider: [facility] Others present for call: none  1. Situational anxiety Patient to have MRI soon.  Worried about situational anxiety as she suffers from claustrophobia and worries about having a panic attack.  She was given alprazolam once and this seemed to help.  She prefers to have something that does not last as long however.  Her husband will drive her to and from the MRI.   ROS: Per HPI  Allergies  Allergen Reactions  . Benazepril Hcl Cough   Past Medical History:  Diagnosis Date  . Calcification of bronchial airway 06/23/2018   01/2017 - See on CT imaging of the chest   . Cataract   . Cerebral vascular disease 06/19/2018   06/19/2018 - MRI with chronic microvascular ischemic change  . Diverticulosis   . Hypertension   . Osteoporosis   . PAF (paroxysmal atrial fibrillation) ([place])    a. s/p prior DCCV; b. On flecainide & coumadin (CHA2DS2VASc = 4);  c. 03/2016 Echo: EF 55-60%, mod LVH, mod AI, mild to mod TR, PASP 86mmHg.  Marland Kitchen Pelvic fracture ([place])   . Pulmonary fibrosis ([place]) 2018  . Rheumatoid arthritis ([place]) 2017    Current Outpatient Medications:  .  apixaban (ELIQUIS) 2.5 MG TABS tablet, Take 1 tablet (2.5 mg total) by mouth 2 (two) times daily., Disp: 180 tablet, Rfl: 3 .  azaTHIOprine (IMURAN) 50 MG tablet, Take 50  mg by mouth daily. , Disp: , Rfl:  .  calcium-vitamin D (OSCAL WITH D) 250-125 MG-UNIT per tablet, Take 1 tablet by mouth 2 (two) times daily., Disp: , Rfl:  .  diltiazem (CARDIZEM CD) 300 MG 24 hr capsule, Take 1 capsule (300 mg total) by mouth daily., Disp: 90 capsule, Rfl: 3 .  ESBRIET 267 MG TABS, TAKE 3 TABLETS (801MG ) BY  MOUTH THREE TIMES DAILY  WITH FOOD, Disp: 270 tablet, Rfl: 3 .  furosemide (LASIX) 20 MG tablet, TAKE 1 TO 2 TABLETS BY  MOUTH DAILY AS NEEDED, Disp: 180 tablet, Rfl: 3 .  gabapentin (NEURONTIN) 100 MG capsule, Take 100 mg by mouth at bedtime. , Disp: , Rfl:  .  levothyroxine (SYNTHROID) 75 MCG tablet, TAKE 1 TABLET BY MOUTH  DAILY, Disp: 90 tablet, Rfl: 2 .  lisinopril (ZESTRIL) 20 MG tablet, TAKE 1 TABLET BY MOUTH  DAILY (Patient taking differently: Take 20 mg by mouth at bedtime.), Disp: 90 tablet, Rfl: 3 .  lisinopril-hydrochlorothiazide (ZESTORETIC) 20-12.5 MG tablet, TAKE 1 TABLET BY MOUTH  DAILY IN THE MORNING, Disp: 90 tablet, Rfl: 3 .  Melatonin 5 MG TABS, Take 5 mg by mouth at bedtime., Disp: , Rfl:  .  Multiple Vitamin (MULTIVITAMIN) capsule, Take 1 capsule by mouth daily., Disp: , Rfl:  .  ondansetron (ZOFRAN ODT) 4 MG disintegrating tablet, Take 1 tablet (4 mg total) by mouth every 8 (eight) hours as needed for nausea or vomiting., Disp:  20 tablet, Rfl: 0 .  potassium chloride SA (KLOR-CON) 20 MEQ tablet, TAKE 1 TABLET BY MOUTH  DAILY, Disp: 90 tablet, Rfl: 3 .  Probiotic Product (PROBIOTIC-10 ULTIMATE PO), Take 1 tablet by mouth daily., Disp: , Rfl:   Current Facility-Administered Medications:  .  cyanocobalamin ((VITAMIN B-12)) injection 1,000 mcg, 1,000 mcg, Intramuscular, Q30 days, Ladanian Kelter M, DO, 1,000 mcg at 03/23/21 1054  Assessment/ Plan: 87 y.o. female   Situational anxiety - Plan: LORazepam (ATIVAN) 0.5 MG tablet  Possible side effects discussed.  Husband to drive to and from MRI.  The Narcotic Database has been reviewed.  There were no  red flags.      Start time: 1:04pm End time: 1:09pm  Total time spent on patient care (including telephone call/ virtual visit): 5 minutes  [place], [place] [phone]

## 2021-04-10 ENCOUNTER — Other Ambulatory Visit: Payer: Self-pay

## 2021-04-10 ENCOUNTER — Ambulatory Visit
Admission: RE | Admit: 2021-04-10 | Discharge: 2021-04-10 | Disposition: A | Discharge status: Home / Self Care | Payer: Medicare Other | Source: Ambulatory Visit | Attending: Family Medicine | Admitting: Family Medicine

## 2021-04-10 DIAGNOSIS — M545 Low back pain, unspecified: Secondary | ICD-10-CM | POA: Diagnosis not present

## 2021-04-10 DIAGNOSIS — M48061 Spinal stenosis, lumbar region without neurogenic claudication: Secondary | ICD-10-CM | POA: Diagnosis not present

## 2021-04-10 DIAGNOSIS — M5136 Other intervertebral disc degeneration, lumbar region: Secondary | ICD-10-CM

## 2021-04-10 NOTE — Progress Notes (Signed)
[facility] [address] [place] [address]   CLINIC:  Medical Oncology/Hematology  CONSULT NOTE  Patient Care Team: [name], DO as PCP - General (Family Medicine) Minus Breeding, MD as PCP - Cardiology (Cardiology) Druscilla Brownie, MD as Consulting Physician (Dermatology) Hennie Duos, MD as Consulting Physician (Rheumatology) Marlaine Hind, MD as Consulting Physician (Physical Medicine and Rehabilitation)  CHIEF COMPLAINTS/PURPOSE OF CONSULTATION:  Thrombocytopenia  HISTORY OF PRESENTING ILLNESS:  Susan Fitzgerald 87 y.o. female is here at the request of her primary care physician (Dr. Ronnie Doss of [facility]).    Per review of labs, she has had mild thrombocytopenia for the last 8 months with platelets 133 (06/07/2020) and more recently platelets 138 (03/19/2021).  She did have a previous episode of mild thrombocytopenia in March 2018, with lowest platelets 106 (01/29/2017).  She also has slight elevation in her MCV at 99.0, which may indicate nutritional deficiency (such as B15 or folic acid) or early MDS, which may also be affecting her platelets.  She does not have any hemoglobin derangements or WBC abnormalities apparent on CBC.  She admits to easy bruising, but notes that she is also on Eliquis for her chronic atrial fibrillation.  She does not have any current signs or symptoms of blood loss (no epistaxis, hematemesis, hemoptysis, hematuria, hematochezia, or melena), but did have an episode of rectal bleeding in October 2020 that was secondary to internal hemorrhoids.   She denies any B-symptoms, masses, or lymphadenopathy.  She does have chronic fatigue and reports that her energy is at 50%.  Appetite is also about 50%, but she forces herself to eat and is maintaining a stable weight.  Her past medical history is notable for rheumatoid arthritis, for which she takes azathioprine.  She also is on pirfenidone  for her pulmonary fibrosis (thought to be due to amiodarone use).  She denies any recent medication changes.  No known problems with her liver or spleen.  PMH otherwise significant for chronic atrial fibrillation (Eliquis), pulmonary fibrosis (pirfenidone), rheumatoid arthritis (azathioprine),  and multiple other chronic comorbidities noted elsewhere in medical record.  Susan Fitzgerald lives at home with her husband and is functionally independent.  She enjoys an active retirement and plays golf several times per week.  She is retired from work as Agricultural engineer.  No chemical or pesticide exposure. Lifelong nonsmoker; no alcohol or illicit drugs.  She denies any family history of blood disorders. Positive for heart disease and stroke.  The patient's daughter is deceased from breast cancer at age 80.  Patient's maternal aunt also had breast cancer. Her brother had colon cancer.     MEDICAL HISTORY:  Past Medical History:  Diagnosis Date  . Calcification of bronchial airway 06/23/2018   01/2017 - See on CT imaging of the chest   . Cataract   . Cerebral vascular disease 06/19/2018   06/19/2018 - MRI with chronic microvascular ischemic change  . Diverticulosis   . Hypertension   . Osteoporosis   . PAF (paroxysmal atrial fibrillation) ([facility])    a. s/p prior DCCV; b. On flecainide & coumadin (CHA2DS2VASc = 4);  c. 03/2016 Echo: EF 55-60%, mod LVH, mod AI, mild to mod TR, PASP 49mHg.  .[place]land [place]elvic fracture ([facility]   . Pulmonary fibrosis ([facility] 2018  . Rheumatoid arthritis ([facility] 2017    SURGICAL HISTORY: Past Surgical History:  Procedure Laterality Date  . BIOPSY BREAST    . BIOPSY BREAST  right & benign  . BREAST SURGERY    . CARDIOVERSION N/A 10/30/2016   Procedure: CARDIOVERSION;  Surgeon: Larey Dresser, MD;  Location: [facility];  Service: Cardiovascular;  Laterality: N/A;  . CARDIOVERSION N/A 08/20/2017   Procedure: CARDIOVERSION;  Surgeon: Jerline Pain, MD;  Location: [facility] ENDOSCOPY;  Service:  Cardiovascular;  Laterality: N/A;  . CARDIOVERSION N/A 02/02/2020   Procedure: CARDIOVERSION;  Surgeon: Geralynn Rile, MD;  Location: [facility];  Service: Cardiovascular;  Laterality: N/A;  . CATARACT EXTRACTION    . COLONOSCOPY    . EYE SURGERY     cataracts  . TEE WITHOUT CARDIOVERSION N/A 10/30/2016   Procedure: TRANSESOPHAGEAL ECHOCARDIOGRAM (TEE);  Surgeon: Larey Dresser, MD;  Location: [facility];  Service: Cardiovascular;  Laterality: N/A;  . TOTAL HIP ARTHROPLASTY Right 01/27/2017   Procedure: TOTAL HIP ARTHROPLASTY ANTERIOR APPROACH;  Surgeon: Rod Can, MD;  Location: [facility];  Service: Orthopedics;  Laterality: Right;    SOCIAL HISTORY: Social History   Socioeconomic History  . Marital status: Married    Spouse name: Rush Landmark   . Number of children: 2  . Years of education: 47  . Highest education level: Not on file  Occupational History  . Occupation: RETIRED  Tobacco Use  . Smoking status: Never Smoker  . Smokeless tobacco: Never Used  Vaping Use  . Vaping Use: Never used  Substance and Sexual Activity  . Alcohol use: No  . Drug use: No  . Sexual activity: Yes  Other Topics Concern  . Not on file  Social History Narrative  . Not on file   Social Determinants of Health   Financial Resource Strain: Low Risk   . Difficulty of Paying Living Expenses: Not hard at all  Food Insecurity: No Food Insecurity  . Worried About Charity fundraiser in the Last Year: Never true  . Ran Out of Food in the Last Year: Never true  Transportation Needs: No Transportation Needs  . Lack of Transportation (Medical): No  . Lack of Transportation (Non-Medical): No  Physical Activity: Sufficiently Active  . Days of Exercise per Week: 2 days  . Minutes of Exercise per Session: 90 min  Stress: No Stress Concern Present  . Feeling of Stress : Not at all  Social Connections: Moderately Integrated  . Frequency of Communication with Friends and Family: More than three  times a week  . Frequency of Social Gatherings with Friends and Family: Twice a week  . Attends Religious Services: More than 4 times per year  . Active Member of Clubs or Organizations: No  . Attends Archivist Meetings: Never  . Marital Status: Married  Human resources officer Violence: Not At Risk  . Fear of Current or Ex-Partner: No  . Emotionally Abused: No  . Physically Abused: No  . Sexually Abused: No    FAMILY HISTORY: Family History  Problem Relation Age of Onset  . Stroke Mother        cerebral hemorrhage  . Heart disease Father        MI  . Heart attack Father   . Vision loss Father   . Heart disease Brother   . Heart attack Brother   . Cancer Maternal Aunt        breast  . Cancer Brother   . Heart disease Brother   . Cancer Daughter        breast  . Heart disease Son   . Colon cancer Neg Hx   .  Colon polyps Neg Hx   . Rectal cancer Neg Hx   . Stomach cancer Neg Hx     ALLERGIES:  is allergic to benazepril hcl.  MEDICATIONS:  Current Outpatient Medications  Medication Sig Dispense Refill  . apixaban (ELIQUIS) 2.5 MG TABS tablet Take 1 tablet (2.5 mg total) by mouth 2 (two) times daily. 180 tablet 3  . azaTHIOprine (IMURAN) 50 MG tablet Take 50 mg by mouth daily.     . calcium-vitamin D (OSCAL WITH D) 250-125 MG-UNIT per tablet Take 1 tablet by mouth 2 (two) times daily.    Marland Kitchen diltiazem (CARDIZEM CD) 300 MG 24 hr capsule Take 1 capsule (300 mg total) by mouth daily. 90 capsule 3  . ESBRIET 267 MG TABS TAKE 3 TABLETS (801MG) BY  MOUTH THREE TIMES DAILY  WITH FOOD 270 tablet 3  . furosemide (LASIX) 20 MG tablet TAKE 1 TO 2 TABLETS BY  MOUTH DAILY AS NEEDED 180 tablet 3  . gabapentin (NEURONTIN) 100 MG capsule Take 100 mg by mouth at bedtime.     Marland Kitchen levothyroxine (SYNTHROID) 75 MCG tablet TAKE 1 TABLET BY MOUTH  DAILY 90 tablet 2  . lisinopril (ZESTRIL) 20 MG tablet TAKE 1 TABLET BY MOUTH  DAILY (Patient taking differently: Take 20 mg by mouth at bedtime.)  90 tablet 3  . lisinopril-hydrochlorothiazide (ZESTORETIC) 20-12.5 MG tablet TAKE 1 TABLET BY MOUTH  DAILY IN THE MORNING 90 tablet 3  . Melatonin 5 MG TABS Take 5 mg by mouth at bedtime.    . Multiple Vitamin (MULTIVITAMIN) capsule Take 1 capsule by mouth daily.    . potassium chloride SA (KLOR-CON) 20 MEQ tablet TAKE 1 TABLET BY MOUTH  DAILY 90 tablet 3  . Probiotic Product (PROBIOTIC-10 ULTIMATE PO) Take 1 tablet by mouth daily.    . ondansetron (ZOFRAN ODT) 4 MG disintegrating tablet Take 1 tablet (4 mg total) by mouth every 8 (eight) hours as needed for nausea or vomiting. (Patient not taking: Reported on 04/11/2021) 20 tablet 0   Current Facility-Administered Medications  Medication Dose Route Frequency Provider Last Rate Last Admin  . cyanocobalamin ((VITAMIN B-12)) injection 1,000 mcg  1,000 mcg Intramuscular Q30 days Ronnie Doss M, DO   1,000 mcg at 03/23/21 1054    REVIEW OF SYSTEMS:   Review of Systems  Constitutional: Positive for appetite change (chronically low appetite, 50%) and fatigue (chronic, energy 50%). Negative for chills, diaphoresis, fever and unexpected weight change.  HENT:   Negative for lump/mass and nosebleeds.   Eyes: Negative for eye problems.  Respiratory: Positive for shortness of breath (chronic due to pulmonary fibrosis). Negative for cough and hemoptysis.   Cardiovascular: Positive for palpitations (chronic atrial fibrillation). Negative for chest pain and leg swelling.  Gastrointestinal: Negative for abdominal pain, blood in stool, constipation, diarrhea, nausea and vomiting.  Genitourinary: Negative for hematuria.   Skin: Negative.   Neurological: Positive for numbness. Negative for dizziness, headaches and light-headedness.  Hematological: Does not bruise/bleed easily.  Psychiatric/Behavioral: Positive for sleep disturbance.      PHYSICAL EXAMINATION: ECOG PERFORMANCE STATUS: 1 - Symptomatic but completely ambulatory  Vitals:   04/11/21  0851  BP: (!) 150/58  Pulse: 90  Resp: 17  Temp: (!) 96.8 F (36 C)  SpO2: 98%   Filed Weights   04/11/21 0851  Weight: 119 lb 11.2 oz (54.3 kg)    Physical Exam Constitutional:      Appearance: Normal appearance.  HENT:     Head: Normocephalic and  atraumatic.     Mouth/Throat:     Mouth: Mucous membranes are moist.  Eyes:     Extraocular Movements: Extraocular movements intact.     Pupils: Pupils are equal, round, and reactive to light.  Cardiovascular:     Rate and Rhythm: Normal rate. Rhythm irregularly irregular.     Pulses: Normal pulses.     Heart sounds: Normal heart sounds.  Pulmonary:     Effort: Pulmonary effort is normal.     Breath sounds: Normal breath sounds.  Abdominal:     General: Bowel sounds are normal.     Palpations: Abdomen is soft.     Tenderness: There is no abdominal tenderness.  Musculoskeletal:        General: No swelling.     Right lower leg: No edema.     Left lower leg: No edema.  Lymphadenopathy:     Cervical: No cervical adenopathy.  Skin:    General: Skin is warm and dry.  Neurological:     General: No focal deficit present.     Mental Status: She is alert and oriented to person, place, and time.  Psychiatric:        Mood and Affect: Mood normal.        Behavior: Behavior normal.       LABORATORY DATA:  I have reviewed the data as listed Recent Results (from the past 2160 hour(s))  CBC with Differential/Platelet     Status: Abnormal   Collection Time: 03/19/21  9:01 AM  Result Value Ref Range   WBC 5.6 3.4 - 10.8 x10E3/uL   RBC 3.94 3.77 - 5.28 x10E6/uL   Hemoglobin 13.3 11.1 - 15.9 g/dL   Hematocrit 38.9 34.0 - 46.6 %   MCV 99 (H) 79 - 97 fL   MCH 33.8 (H) 26.6 - 33.0 pg   MCHC 34.2 31.5 - 35.7 g/dL   RDW 12.2 11.7 - 15.4 %   Platelets 138 (L) 150 - 450 x10E3/uL   Neutrophils 61 Not Estab. %   Lymphs 22 Not Estab. %   Monocytes 13 Not Estab. %   Eos 3 Not Estab. %   Basos 1 Not Estab. %   Neutrophils Absolute  3.4 1.4 - 7.0 x10E3/uL   Lymphocytes Absolute 1.2 0.7 - 3.1 x10E3/uL   Monocytes Absolute 0.7 0.1 - 0.9 x10E3/uL   EOS (ABSOLUTE) 0.2 0.0 - 0.4 x10E3/uL   Basophils Absolute 0.0 0.0 - 0.2 x10E3/uL   Immature Granulocytes 0 Not Estab. %   Immature Grans (Abs) 0.0 0.0 - 0.1 x10E3/uL  CMP14+EGFR     Status: Abnormal   Collection Time: 03/19/21  9:01 AM  Result Value Ref Range   Glucose 81 65 - 99 mg/dL   BUN 24 8 - 27 mg/dL   Creatinine, Ser 1.09 (H) 0.57 - 1.00 mg/dL   eGFR 49 (L) >59 mL/min/1.73   BUN/Creatinine Ratio 22 12 - 28   Sodium 144 134 - 144 mmol/L   Potassium 4.0 3.5 - 5.2 mmol/L   Chloride 103 96 - 106 mmol/L   CO2 27 20 - 29 mmol/L   Calcium 9.7 8.7 - 10.3 mg/dL   Total Protein 6.4 6.0 - 8.5 g/dL   Albumin 4.1 3.6 - 4.6 g/dL   Globulin, Total 2.3 1.5 - 4.5 g/dL   Albumin/Globulin Ratio 1.8 1.2 - 2.2   Bilirubin Total 0.4 0.0 - 1.2 mg/dL   Alkaline Phosphatase 81 44 - 121 IU/L   AST 21 0 -  40 IU/L   ALT 11 0 - 32 IU/L  TSH     Status: None   Collection Time: 03/19/21  9:01 AM  Result Value Ref Range   TSH 1.420 0.450 - 4.500 uIU/mL  T4, free     Status: None   Collection Time: 03/19/21  9:01 AM  Result Value Ref Range   Free T4 1.44 0.82 - 1.77 ng/dL  Specimen status report     Status: None   Collection Time: 03/19/21  9:01 AM  Result Value Ref Range   specimen status report Comment     Comment: Written Authorization Written Authorization Written Authorization Received. Authorization received from [employer] 03-26-2021 Logged by Leona Carry   Hepatitis B surface antibody,qualitative     Status: None   Collection Time: 04/11/21 10:21 AM  Result Value Ref Range   Hep B S Ab NON REACTIVE NON REACTIVE    Comment: (NOTE) Inconsistent with immunity, less than 10 mIU/mL.  Performed at [facility], [place] [address] [place], [place] [address]   Hepatitis B core antibody, total     Status: None   Collection Time: 04/11/21 10:21 AM  Result  Value Ref Range   Hep B Core Total Ab NON REACTIVE NON REACTIVE    Comment: Performed at [facility] [address], [place], [place] [address]  Vitamin B12     Status: Abnormal   Collection Time: 04/11/21 10:22 AM  Result Value Ref Range   Vitamin B-12 1,575 (H) 180 - 914 pg/mL    Comment: RESULTS CONFIRMED BY MANUAL DILUTION Performed at [facility], [address], [place], [place] [address]   Folate     Status: None   Collection Time: 04/11/21 10:22 AM  Result Value Ref Range   Folate 61.9 >5.9 ng/mL    Comment: RESULTS CONFIRMED BY MANUAL DILUTION Performed at [facility], [address], [place], [place] [address]   Hepatitis B surface antigen     Status: None   Collection Time: 04/11/21 10:22 AM  Result Value Ref Range   Hepatitis B Surface Ag NON REACTIVE NON REACTIVE    Comment: Performed at [facility], [place] [address], [place], [place] [address]    RADIOGRAPHIC STUDIES: I have personally reviewed the radiological images as listed and agreed with the findings in the report. MR Lumbar Spine Wo Contrast  Result Date: 04/11/2021 CLINICAL DATA:  Low back pain with increased fracture risk EXAM: MRI LUMBAR SPINE WITHOUT CONTRAST TECHNIQUE: Multiplanar, multisequence MR imaging of the lumbar spine was performed. No intravenous contrast was administered. COMPARISON:  Lumbar myelogram 04/09/2017 FINDINGS: Segmentation:  5 lumbar type vertebrae as previously established. Alignment: Kyphotic curvature related to remote T11 compression fracture.Mild retrolisthesis at T12-L1 and L1-2. Vertebrae: No acute fracture, evidence of discitis, or bone lesion. Remote and healed T11 compression fracture with advanced anterior height loss. Mild discogenic endplate edema at E93-Y1 which is left eccentric. Conus medullaris and cauda equina: Conus extends to the L2 level. Conus and cauda equina appear normal. Paraspinal and other soft tissues: No evidence of mass or inflammation Disc levels:  T12- L1: Disc narrowing and bulging with mild retrolisthesis. L1-L2: Disc narrowing and bulging with mild retrolisthesis. L2-L3: Disc narrowing and bulging with small right inferior foraminal protrusion. Facet spurring and ligamentum flavum thickening. Mild spinal stenosis. L3-L4: Disc narrowing and bulging with endplate spurring. Degenerative facet spurring. Mild spinal stenosis L4-L5: Disc narrowing and bulging with endplate spurring. Degenerative facet spurring  on both sides. Mild borderline moderate spinal stenosis. There is bilateral subarticular recess narrowing with impingement likelihood greater on the right. A cystic appearing space of the right subarticular recess on axial T2 weighted imaging is most likely dura, no synovial cyst seen on sagittal images. Moderate right foraminal narrowing. L5-S1:Disc narrowing and bulging with endplate ridging. Degenerative facet spurring asymmetric to the right. Advanced right foraminal stenosis. Right subarticular recess narrowing. IMPRESSION: 1. No acute finding such as compression fracture. Remote T11 compression fracture with advanced height loss. 2. Generalized spinal degeneration without detected progression from 2018. 3. Right foraminal impingement at L4-5 and especially L5-S1. Electronically Signed   By: Monte Fantasia M.D.   On: 04/11/2021 04:30    ASSESSMENT: 1.  Mild thrombocytopenia - Per review of labs, she has had mild thrombocytopenia for the last 8 months with platelets 133 (06/07/2020) and more recently platelets 138 (03/19/2021). - She did have a self-limited episode of mild thrombocytopenia in March 2018, with lowest platelets 106 (01/29/2017). - She also has slight elevation in her MCV at 99.0, which may indicate nutritional deficiency (such as H21 or folic acid) or early MDS, which may also be affecting her platelets. - She does not have any anemia, hemoglobin derangements, or WBC abnormalities apparent on CBC. - Most recent abdominal imaging  via abdominal ultrasound (09/24/2016): Spleen size and appearance within normal limits - Differential diagnosis is wide, and includes immune-mediated thrombocytopenia (positive history of rheumatoid arthritis), drug-induced isolated thrombocytopenia (azathioprine), nutritional deficiencies, and early MDS  2. Other history - PMH:  Chronic atrial fibrillation (Eliquis), pulmonary fibrosis (pirfenidone), rheumatoid arthritis (azathioprine),  and other comorbidities noted elsewhere in medical record -Susan Fitzgerald lives at home with her husband and is functionally independent.  She enjoys an active retirement and plays golf several times per week.  She is retired from work as Agricultural engineer. - Lifelong nonsmoker; no alcohol or illicit drugs.  No chemical or pesticide exposure. - She denies any family history of blood disorders. Positive for heart disease and stroke.  The patient's daughter is deceased from breast cancer at age 14.  Patient's maternal aunt also had breast cancer.  Her brother had colon cancer.    PLAN:  1.  Mild thrombocytopenia - Differential diagnosis is wide, and includes immune-mediated thrombocytopenia (positive history of rheumatoid arthritis), drug-induced isolated thrombocytopenia (azathioprine), nutritional deficiencies, and early MDS - Check nutritional deficiencies with vitamin B12, methylmalonic acid, folate, copper, iron/TIBC, ferritin - We will check repeat CBC with pathology smear review for any possible abnormal platelet morphology - We will check common infectious causes of thrombocytopenia such as hepatitis, and H. Pylori - RTC in 2-3 weeks to discuss results and next steps   PLAN SUMMARY & DISPOSITION: - Labs today -  RTC in 2-3 weeks to discuss results and next steps   All questions were answered. The patient knows to call the clinic with any problems, questions or concerns.  Medical decision making: Moderate (1 new problem under work-up, review of external notes,  review of previous tests, ordering new tests)  Time spent on visit: I spent 25 minutes counseling the patient face to face. The total time spent in the appointment was 40 minutes and more than 50% was on counseling.   I, Tarri Abernethy PA-C, have seen this patient in conjunction with Dr. Derek Jack. Greater than 50% of visit was performed by Dr. Delton Coombes.  Addendum: I have independently evaluated this patient face-to-face and formulated my assessment and plan.  I agree with HPI  written by Tarri Abernethy, PA-C.  She has mild thrombocytopenia since July 2021.  She has been on azathioprine for rheumatoid arthritis close to 2 years.  Prior to that she was on methotrexate.  She does not have any bleeding issues.  We discussed the differential diagnosis for mild thrombocytopenia including drug-induced, immune mediated and early MDS.  We will do work-up for common nutritional deficiencies and infectious etiologies.  We will see her back in 2 to 3 weeks to discuss results.   Derek Jack, MD 04/11/21 7:27 PM

## 2021-04-11 ENCOUNTER — Encounter (HOSPITAL_COMMUNITY): Payer: Self-pay | Admitting: Hematology

## 2021-04-11 ENCOUNTER — Inpatient Hospital Stay (HOSPITAL_COMMUNITY): Payer: Medicare Other | Attending: Hematology | Admitting: Hematology

## 2021-04-11 ENCOUNTER — Inpatient Hospital Stay (HOSPITAL_COMMUNITY): Payer: Medicare Other

## 2021-04-11 VITALS — BP 150/58 | HR 90 | Temp 96.8°F | Resp 17 | Ht 60.0 in | Wt 119.7 lb

## 2021-04-11 DIAGNOSIS — M069 Rheumatoid arthritis, unspecified: Secondary | ICD-10-CM

## 2021-04-11 DIAGNOSIS — R200 Anesthesia of skin: Secondary | ICD-10-CM

## 2021-04-11 DIAGNOSIS — Z803 Family history of malignant neoplasm of breast: Secondary | ICD-10-CM

## 2021-04-11 DIAGNOSIS — E639 Nutritional deficiency, unspecified: Secondary | ICD-10-CM | POA: Diagnosis not present

## 2021-04-11 DIAGNOSIS — K648 Other hemorrhoids: Secondary | ICD-10-CM

## 2021-04-11 DIAGNOSIS — R002 Palpitations: Secondary | ICD-10-CM | POA: Diagnosis not present

## 2021-04-11 DIAGNOSIS — D696 Thrombocytopenia, unspecified: Secondary | ICD-10-CM

## 2021-04-11 DIAGNOSIS — R5382 Chronic fatigue, unspecified: Secondary | ICD-10-CM | POA: Insufficient documentation

## 2021-04-11 DIAGNOSIS — Z809 Family history of malignant neoplasm, unspecified: Secondary | ICD-10-CM | POA: Diagnosis not present

## 2021-04-11 DIAGNOSIS — Z79899 Other long term (current) drug therapy: Secondary | ICD-10-CM | POA: Insufficient documentation

## 2021-04-11 DIAGNOSIS — Z7901 Long term (current) use of anticoagulants: Secondary | ICD-10-CM | POA: Diagnosis not present

## 2021-04-11 DIAGNOSIS — Z823 Family history of stroke: Secondary | ICD-10-CM

## 2021-04-11 DIAGNOSIS — Z821 Family history of blindness and visual loss: Secondary | ICD-10-CM | POA: Diagnosis not present

## 2021-04-11 DIAGNOSIS — I10 Essential (primary) hypertension: Secondary | ICD-10-CM | POA: Insufficient documentation

## 2021-04-11 DIAGNOSIS — Z8249 Family history of ischemic heart disease and other diseases of the circulatory system: Secondary | ICD-10-CM

## 2021-04-11 DIAGNOSIS — R0602 Shortness of breath: Secondary | ICD-10-CM

## 2021-04-11 DIAGNOSIS — G479 Sleep disorder, unspecified: Secondary | ICD-10-CM | POA: Insufficient documentation

## 2021-04-11 DIAGNOSIS — I482 Chronic atrial fibrillation, unspecified: Secondary | ICD-10-CM | POA: Diagnosis not present

## 2021-04-11 LAB — HEPATITIS B SURFACE ANTIBODY,QUALITATIVE: Hep B S Ab: NONREACTIVE

## 2021-04-11 LAB — HEPATITIS B CORE ANTIBODY, TOTAL: Hep B Core Total Ab: NONREACTIVE

## 2021-04-11 LAB — VITAMIN B12: Vitamin B-12: 1575 pg/mL — ABNORMAL HIGH (ref 180–914)

## 2021-04-11 LAB — HEPATITIS B SURFACE ANTIGEN: Hepatitis B Surface Ag: NONREACTIVE

## 2021-04-11 LAB — FOLATE: Folate: 61.9 ng/mL (ref >5.9)

## 2021-04-11 NOTE — Patient Instructions (Addendum)
[facility] Discharge Instructions  You were seen today by Dr. Delton Coombes and Tarri Abernethy PA-C for your mild thrombocytopenia (slightly decreased platelets).  There are many things that could cause this in your case - such as autoimmune thrombocytopenia, vitamin deficiencies, early signs of bone marrow disease (MDS), or drug-related thrombocytopenia from your azathioprine.  We will run some tests to check for common causes of low platelets.  Regardless of what is causing your platelets to be decreased, this is very mild and does not need any treatment at this time.  We will check labs and continue to monitor you.  LABS: Labs today before you leave the hospital - on the first floor   OTHER TESTS: None  MEDICATIONS: No changes - you can continue to take your Eliquis and azathioprine  FOLLOW-UP APPOINTMENT: Return in 2 weeks to discuss results and next steps   Thank you for choosing [facility] to provide your oncology and hematology care.  To afford each patient quality time with our provider, please arrive at least 15 minutes before your scheduled appointment time.   If you have a lab appointment with the [facility] please come in thru the Main Entrance and check in at the main information desk.  You need to re-schedule your appointment should you arrive 10 or more minutes late.  We strive to give you quality time with our providers, and arriving late affects you and other patients whose appointments are after yours.  Also, if you no show three or more times for appointments you may be dismissed from the clinic at the providers discretion.     Again, thank you for choosing [facility].  Our hope is that these requests will decrease the amount of time that you wait before being seen by our physicians.       _____________________________________________________________  Should you have questions after  your visit to [facility], please contact our office at [phone] and follow the prompts.  Our office hours are 8:00 a.m. and 4:30 p.m. Monday - Friday.  Please note that voicemails left after 4:00 p.m. may not be returned until the following business day.  We are closed weekends and major holidays.  You do have access to a nurse 24-7, just call the main number to the clinic [phone] and do not press any options, hold on the line and a nurse will answer the phone.    For prescription refill requests, have your pharmacy contact our office and allow 72 hours.    Due to Covid, you will need to wear a mask upon entering the hospital. If you do not have a mask, a mask will be given to you at the Main Entrance upon arrival. For doctor visits, patients may have 1 support person age 40 or older with them. For treatment visits, patients can not have anyone with them due to social distancing guidelines and our immunocompromised population.

## 2021-04-12 LAB — COPPER, SERUM: Copper: 137 ug/dL (ref 80–158)

## 2021-04-12 LAB — HCV INTERPRETATION

## 2021-04-12 LAB — PATHOLOGIST SMEAR REVIEW

## 2021-04-12 LAB — HCV AB W REFLEX TO QUANT PCR: HCV Ab: <0.1 s/co ratio (ref 0.0–0.9)

## 2021-04-12 LAB — H PYLORI, IGM, IGG, IGA AB
H Pylori IgG: 0.48 Index Value (ref 0.00–0.79)
H. Pylogi, Iga Abs: <9 units (ref 0.0–8.9)
H. Pylogi, Igm Abs: <9 units (ref 0.0–8.9)

## 2021-04-15 LAB — METHYLMALONIC ACID, SERUM: Methylmalonic Acid, Quantitative: 191 nmol/L (ref 0–378)

## 2021-04-20 ENCOUNTER — Other Ambulatory Visit: Payer: Self-pay | Admitting: Family Medicine

## 2021-04-20 ENCOUNTER — Other Ambulatory Visit: Payer: Self-pay | Admitting: *Deleted

## 2021-04-20 MED ORDER — ESBRIET 267 MG PO TABS
801.0000 mg | ORAL_TABLET | Freq: Three times a day (TID) | ORAL | 3 refills | Status: DC
Start: 2021-04-20 — End: 2021-07-27

## 2021-04-24 DIAGNOSIS — M150 Primary generalized (osteo)arthritis: Secondary | ICD-10-CM | POA: Diagnosis not present

## 2021-04-24 DIAGNOSIS — Z79899 Other long term (current) drug therapy: Secondary | ICD-10-CM | POA: Diagnosis not present

## 2021-04-24 DIAGNOSIS — M0579 Rheumatoid arthritis with rheumatoid factor of multiple sites without organ or systems involvement: Secondary | ICD-10-CM | POA: Diagnosis not present

## 2021-04-24 DIAGNOSIS — M5030 Other cervical disc degeneration, unspecified cervical region: Secondary | ICD-10-CM | POA: Diagnosis not present

## 2021-04-24 DIAGNOSIS — R7989 Other specified abnormal findings of blood chemistry: Secondary | ICD-10-CM | POA: Diagnosis not present

## 2021-04-24 DIAGNOSIS — M5136 Other intervertebral disc degeneration, lumbar region: Secondary | ICD-10-CM | POA: Diagnosis not present

## 2021-04-24 DIAGNOSIS — J849 Interstitial pulmonary disease, unspecified: Secondary | ICD-10-CM | POA: Diagnosis not present

## 2021-04-24 DIAGNOSIS — Z6821 Body mass index (BMI) 21.0-21.9, adult: Secondary | ICD-10-CM | POA: Diagnosis not present

## 2021-04-25 ENCOUNTER — Ambulatory Visit: Payer: Medicare Other | Admitting: Cardiology

## 2021-04-25 ENCOUNTER — Ambulatory Visit (HOSPITAL_COMMUNITY): Payer: Medicare Other | Admitting: Physician Assistant

## 2021-04-30 NOTE — Progress Notes (Signed)
Cardiology Office Note   Date:  05/01/2021   ID:  Susan Fitzgerald, DOB Mar 14, 1932, MRN [id number]  PCP:  [name], DO  Cardiologist:   Minus Breeding, MD   No chief complaint on file.     History of Present Illness: Susan Fitzgerald is a 87 y.o. female who presents for evaluation of atrial fibrillation. She was cardioverted.  However, she developed recurrent atrial fib.  She failed flecainide.    She has been followed in the atrial fib clinic.  She did describe chest pain and was sent for [facility] which did not suggest ischemia.  She does have a mildly reduced EF (40 - 45% on echo.) Her last echo in March 2019 demonstrated an improved EF of 55 - 60%.    She was started on amiodarone and underwent DCCV. She had increased dyspnea and decreased O2 sats.  She was treated for pneumonia.  She was referred to pulmonary.  A CT was done.  There was a thought that she might have an amiodarone pulmonary process.  Other processes could not be excluded such as a reaction to her methotrexate.  There was also suggestion of pulmonary edema.  She has been treated with diuresis.   She did have an increased creat but this is improved.   She was treated with steroids and did improve.    She has not been told that she has idiopathic idiopathic pulmonary fibrosis.  She had cardioversion again in March 2020 which she probably only helped sinus rhythm for about 4 weeks.  She does not really notice she felt remarkably different during that time.    Since I last saw her she has had no new acute complaints.  She had COVID for second time which she described as a very bad head cold.  She has not had any new shortness of breath, PND or orthopnea.  She has not had any new chest pressure, neck or arm discomfort.  She had no weight gain or edema.  She wonders if she might be a little more short of breath than she had been previously but she still able to go up and down the stairs to do laundry without  significant problems.  She does feel her heart beating hard in the morning until she gets up out of bed.    Past Medical History:  Diagnosis Date   Calcification of bronchial airway 06/23/2018   01/2017 - See on CT imaging of the chest    Cataract    Cerebral vascular disease 06/19/2018   06/19/2018 - MRI with chronic microvascular ischemic change   Diverticulosis    Hypertension    Osteoporosis    PAF (paroxysmal atrial fibrillation) ([place])    a. s/p prior DCCV; b. On flecainide & coumadin (CHA2DS2VASc = 4);  c. 03/2016 Echo: EF 55-60%, mod LVH, mod AI, mild to mod TR, PASP 5mmHg.   Pelvic fracture ([place])    Pulmonary fibrosis ([place]) 2018   Rheumatoid arthritis ([place]) 2017    Past Surgical History:  Procedure Laterality Date   BIOPSY BREAST     BIOPSY BREAST     right & benign   BREAST SURGERY     CARDIOVERSION N/A 10/30/2016   Procedure: CARDIOVERSION;  Surgeon: Larey Dresser, MD;  Location: [place];  Service: Cardiovascular;  Laterality: N/A;   CARDIOVERSION N/A 08/20/2017   Procedure: CARDIOVERSION;  Surgeon: Jerline Pain, MD;  Location: [place];  Service:  Cardiovascular;  Laterality: N/A;   CARDIOVERSION N/A 02/02/2020   Procedure: CARDIOVERSION;  Surgeon: Geralynn Rile, MD;  Location: [place];  Service: Cardiovascular;  Laterality: N/A;   CATARACT EXTRACTION     COLONOSCOPY     EYE SURGERY     cataracts   TEE WITHOUT CARDIOVERSION N/A 10/30/2016   Procedure: TRANSESOPHAGEAL ECHOCARDIOGRAM (TEE);  Surgeon: Larey Dresser, MD;  Location: [place];  Service: Cardiovascular;  Laterality: N/A;   TOTAL HIP ARTHROPLASTY Right 01/27/2017   Procedure: TOTAL HIP ARTHROPLASTY ANTERIOR APPROACH;  Surgeon: Rod Can, MD;  Location: [place];  Service: Orthopedics;  Laterality: Right;     Current Outpatient Medications  Medication Sig Dispense Refill   apixaban (ELIQUIS) 2.5 MG TABS tablet Take 1 tablet (2.5 mg total) by mouth 2 (two) times daily. 180 tablet  3   azaTHIOprine (IMURAN) 50 MG tablet Take 50 mg by mouth daily.      calcium-vitamin D (OSCAL WITH D) 250-125 MG-UNIT per tablet Take 1 tablet by mouth 2 (two) times daily.     diltiazem (CARDIZEM CD) 300 MG 24 hr capsule Take 1 capsule (300 mg total) by mouth daily. 90 capsule 3   ESBRIET 267 MG TABS Take 3 tablets (801 mg total) by mouth 3 (three) times daily. 90 tablet 3   furosemide (LASIX) 20 MG tablet TAKE 1 TO 2 TABLETS BY  MOUTH DAILY AS NEEDED 180 tablet 3   gabapentin (NEURONTIN) 100 MG capsule Take 100 mg by mouth at bedtime.      levothyroxine (SYNTHROID) 75 MCG tablet TAKE 1 TABLET BY MOUTH  DAILY 90 tablet 2   lisinopril (ZESTRIL) 20 MG tablet TAKE 1 TABLET BY MOUTH  DAILY (Patient taking differently: Take 20 mg by mouth at bedtime.) 90 tablet 3   lisinopril-hydrochlorothiazide (ZESTORETIC) 20-12.5 MG tablet TAKE 1 TABLET BY MOUTH  DAILY IN THE MORNING 90 tablet 3   Melatonin 5 MG TABS Take 5 mg by mouth at bedtime.     Multiple Vitamin (MULTIVITAMIN) capsule Take 1 capsule by mouth daily.     potassium chloride SA (KLOR-CON) 20 MEQ tablet TAKE 1 TABLET BY MOUTH  DAILY 90 tablet 1   Probiotic Product (PROBIOTIC-10 ULTIMATE PO) Take 1 tablet by mouth daily.     Current Facility-Administered Medications  Medication Dose Route Frequency Provider Last Rate Last Admin   cyanocobalamin ((VITAMIN B-12)) injection 1,000 mcg  1,000 mcg Intramuscular Q30 days Ronnie Doss M, DO   1,000 mcg at 03/23/21 1054    Allergies:   Benazepril hcl    ROS:  Please see the history of present illness.   Otherwise, review of systems are positive for .   All other systems are reviewed and negative.    PHYSICAL EXAM: VS:  BP (!) 106/58 (BP Location: Left Arm, Patient Position: Sitting)   Pulse 94   Ht 5' (1.524 m)   Wt 119 lb 9.6 oz (54.3 kg)   SpO2 95%   BMI 23.36 kg/m  , BMI Body mass index is 23.36 kg/m. GENERAL:  Well appearing NECK:  No jugular venous distention, waveform within  normal limits, carotid upstroke brisk and symmetric, no bruits, no thyromegaly LUNGS:  Clear to auscultation bilaterally CHEST:  Unremarkable HEART:  PMI not displaced or sustained,S1 and S2 within normal limits, no S3, no S4, no clicks, no rubs, no murmurs ABD:  Flat, positive bowel sounds normal in frequency in pitch, no bruits, no rebound, no guarding, no midline pulsatile mass, no  hepatomegaly, no splenomegaly EXT:  2 plus pulses throughout, no edema, no cyanosis no clubbing   EKG:  EKG is  ordered today. Atrial fibrillation, rate 94, axis within normal limits, intervals within normal limits, no acute ST-T wave changes.  PVCs   Recent Labs: 06/07/2020: NT-Pro BNP 2,630 03/19/2021: ALT 11; BUN 24; Creatinine, Ser 1.09; Hemoglobin 13.3; Platelets 138; Potassium 4.0; Sodium 144; TSH 1.420    Lipid Panel    Component Value Date/Time   CHOL 172 09/15/2020 0845   CHOL 131 04/14/2013 0836   TRIG 59 09/15/2020 0845   TRIG 67 09/15/2017 0845   TRIG 69 04/14/2013 0836   HDL 79 09/15/2020 0845   HDL 71 09/15/2017 0845   HDL 53 04/14/2013 0836   CHOLHDL 2.2 09/15/2020 0845   LDLCALC 81 09/15/2020 0845   LDLCALC 64 04/14/2013 0836   LDLDIRECT 98 04/20/2014 1015      Wt Readings from Last 3 Encounters:  05/01/21 119 lb 9.6 oz (54.3 kg)  04/11/21 119 lb 11.2 oz (54.3 kg)  03/23/21 120 lb 12.8 oz (54.8 kg)      Other studies Reviewed: Additional studies/ records that were reviewed today include:  Labs Review of the above records demonstrates:   See elsewhere  ASSESSMENT AND PLAN:  HTN:    Her BP is actually running on the low side.  She is going to keep an eye on this and if it is persistently low I would back off on her lisinopril.   ATRIAL FIB:   Susan Fitzgerald has a CHA2DS2 - VASc score of 4.  She tolerates anticoagulation.  No change in therapy.  She sometimes feels palpitations which may be the ventricular ectopy but this really has not changed.  No change in  therapy.  DYSPNEA:   I was impressed that her exam demonstrated diffuse fine crackles and had previously.  No change in therapy.    Current medicines are reviewed at length with the patient today.  The patient does not have concerns regarding medicines.  The following changes have been made:  None  Labs/ tests ordered today include:  None  No orders of the defined types were placed in this encounter.    Disposition:   FU with me in 12  months.     Signed, Minus Breeding, MD  05/01/2021 3:24 PM    [facility]

## 2021-05-01 ENCOUNTER — Encounter: Payer: Self-pay | Admitting: Cardiology

## 2021-05-01 ENCOUNTER — Other Ambulatory Visit: Payer: Self-pay

## 2021-05-01 ENCOUNTER — Ambulatory Visit: Payer: Medicare Other | Admitting: Cardiology

## 2021-05-01 VITALS — BP 106/58 | HR 94 | Ht 60.0 in | Wt 119.6 lb

## 2021-05-01 DIAGNOSIS — I4819 Other persistent atrial fibrillation: Secondary | ICD-10-CM

## 2021-05-01 DIAGNOSIS — R0602 Shortness of breath: Secondary | ICD-10-CM

## 2021-05-01 DIAGNOSIS — I10 Essential (primary) hypertension: Secondary | ICD-10-CM | POA: Diagnosis not present

## 2021-05-01 NOTE — Patient Instructions (Signed)
Medication Instructions:  NO CHANGES  *If you need a refill on your cardiac medications before your next appointment, please call your pharmacy*   Lab Work: NOT NEEDED   Testing/Procedures:  NOT NEEDED  Follow-Up: At [facility], you and your health needs are our priority.  As part of our continuing mission to provide you with exceptional heart care, we have created designated Provider Care Teams.  These Care Teams include your primary Cardiologist (physician) and Advanced Practice Providers (APPs -  Physician Assistants and Nurse Practitioners) who all work together to provide you with the care you need, when you need it.     Your next appointment:   12 month(s)  The format for your next appointment:   In Person  Provider:   Minus Breeding, MD   Other Instructions

## 2021-05-01 NOTE — Progress Notes (Signed)
Virtual Visit via Telephone Note [facility]  I connected with Susan Fitzgerald on 05/02/21 at 9:02 AM by telephone and verified that I am speaking with the correct person using two identifiers.  Location: Patient: Home Provider: [place]   I discussed the limitations, risks, security and privacy concerns of performing an evaluation and management service by telephone and the availability of in person appointments. I also discussed with the patient that there may be a patient responsible charge related to this service. The patient expressed understanding and agreed to proceed.   History of Present Illness: [name] is contacted today for follow-up of her thrombocytopenia.  She was seen for initial consultation by Dr. Delton Coombes and Tarri Abernethy PA-C on 04/11/2021 and returns to discuss lab results from work-up initiated at that time.  Since her last visit, she reports that she is continuing to do well.  No hospitalizations, infections, surgeries, or other changes in her baseline health status since her last visit.    She continues to report easy bruising while on Eliquis, but denies signs or symptoms of blood loss (no epistaxis, hematemesis, hemoptysis, hematuria, hematochezia, or melena).  She denies any B symptoms, masses, or lymphadenopathy.  She continues to have chronic fatigue, reports that her energy is 50%, which she says is her baseline lately.  Appetite is 25%, but she reports that she forces herself to eat and is maintaining a stable weight.    Observations/Objective: Review of Systems  Constitutional:  Positive for malaise/fatigue (energy 50%). Negative for chills, diaphoresis, fever and weight loss.  Respiratory:  Negative for cough and shortness of breath.   Cardiovascular:  Negative for chest pain and leg swelling.  Gastrointestinal:  Negative for abdominal pain, blood in stool, diarrhea, melena, nausea and vomiting.  Neurological:   Negative for dizziness and headaches.    PHYSICAL EXAM (per limitations of virtual telephone visit): The patient is alert and oriented x 3, exhibiting adequate mentation, good mood, and ability to speak in full sentences and execute sound judgement.   Assessment: 1.  Mild thrombocytopenia - Per review of labs, she has had mild thrombocytopenia for the last 8 months with platelets 133 (06/07/2020) and more recently platelets 138 (03/19/2021). - She did have a self-limited episode of mild thrombocytopenia in March 2018, with lowest platelets 106 (01/29/2017). - She also has slight elevation in her MCV at 99.0, which may indicate nutritional deficiency (such as U54 or folic acid) or early MDS, which may also be affecting her platelets. - She does not have any anemia, hemoglobin derangements, or WBC abnormalities apparent on CBC. - Most recent abdominal imaging via abdominal ultrasound (09/24/2016): Spleen size and appearance within normal limits - Hematology work-up (04/11/2021):  Peripheral smear confirms thrombocytopenia H pylori and hepatitis negative No nutritional deficiencies (unremarkable folate, B12, methylmalonic acid, copper) - Differential diagnosis includes immune-mediated thrombocytopenia (positive history of rheumatoid arthritis), drug-induced isolated thrombocytopenia (azathioprine), and early MDS   2. Other history - PMH:  Chronic atrial fibrillation (Eliquis), pulmonary fibrosis (pirfenidone), rheumatoid arthritis (azathioprine),  and other comorbidities noted elsewhere in medical record -[name] lives at home with her husband and is functionally independent.  She enjoys an active retirement and plays golf several times per week.  She is retired from work as Agricultural engineer. - Lifelong nonsmoker; no alcohol or illicit drugs.  No chemical or pesticide exposure. - She denies any family history of blood disorders. Positive for heart disease and stroke.  The patient's daughter is  deceased  from breast cancer at age 77.  Patient's maternal aunt also had breast cancer.  Her brother had colon cancer.    Plan: 1.  Mild thrombocytopenia - Differential diagnosis favors immune-mediated thrombocytopenia (positive history of rheumatoid arthritis), drug-induced isolated thrombocytopenia (azathioprine), or early MDS - No treatment indicated for thrombocytopenia at this time.  Treatment would be considered if platelets were less than 30 or in the event of significant bleeding. - Repeat CBC and RTC in 3 months    Follow Up Instructions: - Repeat CBC and RTC in 3 months     I discussed the assessment and treatment plan with the patient. The patient was provided an opportunity to ask questions and all were answered. The patient agreed with the plan and demonstrated an understanding of the instructions.   The patient was advised to call back or seek an in-person evaluation if the symptoms worsen or if the condition fails to improve as anticipated.  I provided 14 minutes of non-face-to-face time during this encounter.   Harriett Rush, PA-C 05/02/21 9:16 AM

## 2021-05-02 ENCOUNTER — Inpatient Hospital Stay (HOSPITAL_COMMUNITY): Payer: Medicare Other | Attending: Hematology | Admitting: Physician Assistant

## 2021-05-02 DIAGNOSIS — D696 Thrombocytopenia, unspecified: Secondary | ICD-10-CM

## 2021-05-18 ENCOUNTER — Telehealth: Payer: Self-pay | Admitting: Pulmonary Disease

## 2021-05-18 NOTE — Telephone Encounter (Signed)
Spoke to patient, who stated that she has been taking brand name of Esbriet, however pharmacy recently sent generic Esbriet.   She wants to be sure Dr. [name] Browner is okay with her taking generic.  Dr. [name] Browner, please advise. Thanks

## 2021-05-18 NOTE — Telephone Encounter (Signed)
Called and spoke with Patient. Dr. Mannam's recommendations given. Understanding stated. Nothing further at this time. 

## 2021-05-18 NOTE — Telephone Encounter (Signed)
Ok to take the generic esbriet

## 2021-05-22 ENCOUNTER — Ambulatory Visit (INDEPENDENT_AMBULATORY_CARE_PROVIDER_SITE_OTHER): Payer: Medicare Other | Admitting: Physician Assistant

## 2021-05-22 ENCOUNTER — Encounter: Payer: Self-pay | Admitting: Physician Assistant

## 2021-05-22 ENCOUNTER — Other Ambulatory Visit: Payer: Self-pay

## 2021-05-22 VITALS — BP 102/67 | HR 79 | Temp 97.0°F | Ht 60.0 in | Wt 118.8 lb

## 2021-05-22 DIAGNOSIS — L03115 Cellulitis of right lower limb: Secondary | ICD-10-CM | POA: Diagnosis not present

## 2021-05-22 MED ORDER — CEPHALEXIN 500 MG PO CAPS: 500.0000 mg | ORAL_CAPSULE | Freq: Two times a day (BID) | ORAL | 0 refills | Status: DC

## 2021-05-22 NOTE — Progress Notes (Signed)
  Subjective:     Patient ID: Susan Fitzgerald, female   DOB: 1932-07-04, 87 y.o.   MRN: [id number]  HPI Pt with skin tear to the R lower leg Hx of similar in the past  Concerned due to some surrounding erythema  Review of Systems  Constitutional: Negative.   Skin:  Positive for color change and wound.      Objective:   Physical Exam Vitals and nursing note reviewed.  Skin tear tot he R ant lower leg Sl gaping noted + surrounding erythema No drainage noted Distal resolving ecchy noted No sig edema noted Sl TTP Area cleansed Wound edge approx with steri strip Dressing placed     Assessment:     1. Cellulitis of right lower extremity        Plan:     Wound care reviewed Keflex rx S/S of infection reviewed F/U prn

## 2021-05-22 NOTE — Patient Instructions (Signed)
Cellulitis, Adult  Cellulitis is a skin infection. The infected area is often warm, red, swollen, and sore. It occurs most often in the arms and lower legs. It is very importantto get treated for this condition. What are the causes? This condition is caused by bacteria. The bacteria enter through a break in theskin, such as a cut, burn, insect bite, open sore, or crack. What increases the risk? This condition is more likely to occur in people who: Have a weak body defense system (immune system). Have open cuts, burns, bites, or scrapes on the skin. Are older than 85 years of age. Have a blood sugar problem (diabetes). Have a long-lasting (chronic) liver disease (cirrhosis) or kidney disease. Are very overweight (obese). Have a skin problem, such as: Itchy rash (eczema). Slow movement of blood in the veins (venous stasis). Fluid buildup below the skin (edema). Have been treated with high-energy rays (radiation). Use IV drugs. What are the signs or symptoms? Symptoms of this condition include: Skin that is: Red. Streaking. Spotting. Swollen. Sore or painful when you touch it. Warm. A fever. Chills. Blisters. How is this diagnosed? This condition is diagnosed based on: Medical history. Physical exam. Blood tests. Imaging tests. How is this treated? Treatment for this condition may include: Medicines to treat infections or allergies. Home care, such as: Rest. Placing cold or warm cloths (compresses) on the skin. Hospital care, if the condition is very bad. Follow these instructions at home: Medicines Take over-the-counter and prescription medicines only as told by your doctor. If you were prescribed an antibiotic medicine, take it as told by your doctor. Do not stop taking it even if you start to feel better. General instructions  Drink enough fluid to keep your pee (urine) pale yellow. Do not touch or rub the infected area. Raise (elevate) the infected area above the  level of your heart while you are sitting or lying down. Place cold or warm cloths on the area as told by your doctor. Keep all follow-up visits as told by your doctor. This is important.  Contact a doctor if: You have a fever. You do not start to get better after 1-2 days of treatment. Your bone or joint under the infected area starts to hurt after the skin has healed. Your infection comes back. This can happen in the same area or another area. You have a swollen bump in the area. You have new symptoms. You feel ill and have muscle aches and pains. Get help right away if: Your symptoms get worse. You feel very sleepy. You throw up (vomit) or have watery poop (diarrhea) for a long time. You see red streaks coming from the area. Your red area gets larger. Your red area turns dark in color. These symptoms may represent a serious problem that is an emergency. Do not wait to see if the symptoms will go away. Get medical help right away. Call your local emergency services (911 in the [place] Do not drive yourself to the hospital. Summary Cellulitis is a skin infection. The area is often warm, red, swollen, and sore. This condition is treated with medicines, rest, and cold and warm cloths. Take all medicines only as told by your doctor. Tell your doctor if symptoms do not start to get better after 1-2 days of treatment. This information is not intended to replace advice given to you by your health care provider. Make sure you discuss any questions you have with your healthcare provider. Document Revised: 03/26/2018 Document Reviewed: 03/26/2018  Elsevier Patient Education  2022 Elsevier Inc.  

## 2021-05-28 ENCOUNTER — Telehealth: Payer: Self-pay | Admitting: Family Medicine

## 2021-05-28 DIAGNOSIS — Z7189 Other specified counseling: Secondary | ICD-10-CM

## 2021-05-28 NOTE — Telephone Encounter (Signed)
If she is on generic Synthroid (levothyroxine), she can get this at [facility] for $4.  Let me know if she wants me to send it there.  If she has been on brand however, I think there is a specialty pharmacy that does discounted Synthroid.  Definitely will need Susan Fitzgerald's help on that one though and eliquis (though not sure there is any patient assistance for eliquis at the moment).

## 2021-05-29 ENCOUNTER — Ambulatory Visit (INDEPENDENT_AMBULATORY_CARE_PROVIDER_SITE_OTHER): Payer: Medicare Other | Admitting: Physician Assistant

## 2021-05-29 ENCOUNTER — Other Ambulatory Visit: Payer: Self-pay

## 2021-05-29 ENCOUNTER — Encounter: Payer: Self-pay | Admitting: Physician Assistant

## 2021-05-29 ENCOUNTER — Other Ambulatory Visit: Payer: Self-pay | Admitting: Family Medicine

## 2021-05-29 VITALS — BP 117/69 | HR 57 | Temp 97.4°F | Resp 20 | Ht 60.0 in | Wt 117.0 lb

## 2021-05-29 DIAGNOSIS — L03115 Cellulitis of right lower limb: Secondary | ICD-10-CM | POA: Diagnosis not present

## 2021-05-29 MED ORDER — CEPHALEXIN 500 MG PO CAPS: 500.0000 mg | ORAL_CAPSULE | Freq: Two times a day (BID) | ORAL | 0 refills | Status: DC

## 2021-05-29 NOTE — Patient Instructions (Signed)
Cellulitis, Adult  Cellulitis is a skin infection. The infected area is often warm, red, swollen, and sore. It occurs most often in the arms and lower legs. It is very importantto get treated for this condition. What are the causes? This condition is caused by bacteria. The bacteria enter through a break in theskin, such as a cut, burn, insect bite, open sore, or crack. What increases the risk? This condition is more likely to occur in people who: Have a weak body defense system (immune system). Have open cuts, burns, bites, or scrapes on the skin. Are older than 85 years of age. Have a blood sugar problem (diabetes). Have a long-lasting (chronic) liver disease (cirrhosis) or kidney disease. Are very overweight (obese). Have a skin problem, such as: Itchy rash (eczema). Slow movement of blood in the veins (venous stasis). Fluid buildup below the skin (edema). Have been treated with high-energy rays (radiation). Use IV drugs. What are the signs or symptoms? Symptoms of this condition include: Skin that is: Red. Streaking. Spotting. Swollen. Sore or painful when you touch it. Warm. A fever. Chills. Blisters. How is this diagnosed? This condition is diagnosed based on: Medical history. Physical exam. Blood tests. Imaging tests. How is this treated? Treatment for this condition may include: Medicines to treat infections or allergies. Home care, such as: Rest. Placing cold or warm cloths (compresses) on the skin. Hospital care, if the condition is very bad. Follow these instructions at home: Medicines Take over-the-counter and prescription medicines only as told by your doctor. If you were prescribed an antibiotic medicine, take it as told by your doctor. Do not stop taking it even if you start to feel better. General instructions  Drink enough fluid to keep your pee (urine) pale yellow. Do not touch or rub the infected area. Raise (elevate) the infected area above the  level of your heart while you are sitting or lying down. Place cold or warm cloths on the area as told by your doctor. Keep all follow-up visits as told by your doctor. This is important.  Contact a doctor if: You have a fever. You do not start to get better after 1-2 days of treatment. Your bone or joint under the infected area starts to hurt after the skin has healed. Your infection comes back. This can happen in the same area or another area. You have a swollen bump in the area. You have new symptoms. You feel ill and have muscle aches and pains. Get help right away if: Your symptoms get worse. You feel very sleepy. You throw up (vomit) or have watery poop (diarrhea) for a long time. You see red streaks coming from the area. Your red area gets larger. Your red area turns dark in color. These symptoms may represent a serious problem that is an emergency. Do not wait to see if the symptoms will go away. Get medical help right away. Call your local emergency services (911 in the [place] Do not drive yourself to the hospital. Summary Cellulitis is a skin infection. The area is often warm, red, swollen, and sore. This condition is treated with medicines, rest, and cold and warm cloths. Take all medicines only as told by your doctor. Tell your doctor if symptoms do not start to get better after 1-2 days of treatment. This information is not intended to replace advice given to you by your health care provider. Make sure you discuss any questions you have with your healthcare provider. Document Revised: 03/26/2018 Document Reviewed: 03/26/2018  Elsevier Patient Education  2022 Elsevier Inc.  

## 2021-05-29 NOTE — Addendum Note (Signed)
Addended by: Everlean Cherry on: 05/29/2021 09:51 AM   Modules accepted: Orders

## 2021-05-29 NOTE — Progress Notes (Signed)
  Subjective:     Patient ID: Susan Fitzgerald, female   DOB: 1932/04/03, 87 y.o.   MRN: [id number]  Pt here for f/u f cellultis to the R lower leg Still with some pain and redness Tolerating med well Minimal clear drainage    Review of Systems  Skin:  Positive for color change and wound.      Objective:   Physical Exam Vitals and nursing note reviewed.  Constitutional:      General: She is not in acute distress.    Appearance: Normal appearance. She is not toxic-appearing.  Neurological:     Mental Status: She is alert.  Steri strips are in place No drainage at appt today No distal edema Still with some surrounding erythema     Assessment:     1. Cellulitis of right lower extremity        Plan:     Will refer Keflex x 1  Keep area clean and dry Reviewed wound care F/U prn

## 2021-05-31 ENCOUNTER — Telehealth: Payer: Self-pay

## 2021-05-31 NOTE — Chronic Care Management (AMB) (Signed)
  Chronic Care Management   Outreach Note  05/31/2021 Name: Susan Fitzgerald MRN: [id number] DOB: Mar 19, 1932  Susan Fitzgerald is a 87 y.o. year old female who is a primary care patient of [name], DO. I reached out to [name] by phone today in response to a referral sent by Ms. Brandelyn S Kerney's PCP, [name], DO      An unsuccessful telephone outreach was attempted today. The patient was referred to the case management team for assistance with care management and care coordination.   Follow Up Plan: A HIPAA compliant phone message was left for the patient providing contact information and requesting a return call.  The care management team will reach out to the patient again over the next 5 days.  If patient returns call to provider office, please advise to call [facility] at [place], [place], [place], [place] [address] Direct Dial: [phone] [email or web address]: [email or web address]

## 2021-06-04 ENCOUNTER — Encounter: Payer: Self-pay | Admitting: Nurse Practitioner

## 2021-06-04 ENCOUNTER — Ambulatory Visit (INDEPENDENT_AMBULATORY_CARE_PROVIDER_SITE_OTHER): Payer: Medicare Other | Admitting: Nurse Practitioner

## 2021-06-04 ENCOUNTER — Telehealth: Payer: Self-pay | Admitting: Pulmonary Disease

## 2021-06-04 DIAGNOSIS — R059 Cough, unspecified: Secondary | ICD-10-CM | POA: Diagnosis not present

## 2021-06-04 MED ORDER — PREDNISONE 10 MG PO TABS: 40.0000 mg | ORAL_TABLET | Freq: Every day | ORAL | 0 refills | Status: AC

## 2021-06-04 NOTE — Telephone Encounter (Signed)
Called and spoke with patient. She stated that she believes she has bronchitis. She has a productive cough with thick, clear phlegm. Increased SOB, fatigue and chest tightness. She denied any fevers, body aches or sore throat. Also denied being around anyone who has been sick recently. Symptoms have been going on for about 5 days.   She is currently on Keflex 500mg  BID for 10 days for cellulitis. She started this medication on 05/29/21.   Pharmacy is [facility].   Dr. [name] Browner, can you please advise? Thanks!

## 2021-06-04 NOTE — Assessment & Plan Note (Signed)
  I called patient and she prefers to speak with pulmonologist instead due to her history of pulmonary/lung disease.

## 2021-06-04 NOTE — Progress Notes (Addendum)
   Virtual Visit  Note Due to COVID-19 pandemic this visit was conducted virtually. This visit type was conducted due to national recommendations for restrictions regarding the COVID-19 Pandemic (e.g. social distancing, sheltering in place) in an effort to limit this patient's exposure and mitigate transmission in our community. All issues noted in this document were discussed and addressed.  A physical exam was not performed with this format.  I connected with Susan Fitzgerald on 06/04/21  at 10:48 am by telephone and verified that I am speaking with the correct person using two identifiers. Susan Fitzgerald is currently located at home during visit. The provider, Ivy Lynn, NP is located in their office at time of visit.  I discussed the limitations, risks, security and privacy concerns of performing an evaluation and management service by telephone and the availability of in person appointments. I also discussed with the patient that there may be a patient responsible charge related to this service. The patient expressed understanding and agreed to proceed.   History and Present Illness:  Cough This is a new problem. The problem has been unchanged.  For the past few days.  Did not complete patient's assessments due to patient wanting to visit with pulmonologist.  Review of Systems  Respiratory:  Positive for cough.   All other systems reviewed and are negative.   Observations/Objective: Televisit patient not in distress. Assessment and Plan:  I called patient and she prefers to speak with pulmonologist instead due to her history of pulmonary/lung disease and believes that this is just an exacerbation.  Follow Up Instructions: Advised patient to call back if symptoms are not resolved.  Patient verbalized understanding.  And waiting on pulmonologist to return call.    I discussed the assessment and treatment plan with the patient. The patient was provided an opportunity to ask  questions and all were answered. The patient agreed with the plan and demonstrated an understanding of the instructions.   The patient was advised to call back or seek an in-person evaluation if the symptoms worsen or if the condition fails to improve as anticipated.  The above assessment and management plan was discussed with the patient. The patient verbalized understanding of and has agreed to the management plan. Patient is aware to call the clinic if symptoms persist or worsen. Patient is aware when to return to the clinic for a follow-up visit. Patient educated on when it is appropriate to go to the emergency department.   Time call ended: 10:53 AM  I provided 5 minutes of  non face-to-face time during this encounter.    Ivy Lynn, NP

## 2021-06-04 NOTE — Telephone Encounter (Signed)
Called and spoke with Patient. Dr. Mannam's recommendations given. Understanding stated. Nothing further at this time. 

## 2021-06-04 NOTE — Telephone Encounter (Signed)
Keflex should be adequate antibiotics.  I have sent in order for prednisone 40mg /day for 5 days to her pharmacy.  Please call back if there is no improvement

## 2021-06-06 NOTE — Chronic Care Management (AMB) (Signed)
  Chronic Care Management   Note  06/06/2021 Name: SHEENA DONEGAN MRN: [id number] DOB: January 10, 1932  HALLY COLELLA is a 87 y.o. year old female who is a primary care patient of [name], DO. I reached out to [name] by phone today in response to a referral sent by Ms. Merikay S Jahnke's PCP, [name], DO      Ms. Stay was given information about Chronic Care Management services today including:  CCM service includes personalized support from designated clinical staff supervised by her physician, including individualized plan of care and coordination with other care providers 24/7 contact phone numbers for assistance for urgent and routine care needs. Service will only be billed when office clinical staff spend 20 minutes or more in a month to coordinate care. Only one practitioner may furnish and bill the service in a calendar month. The patient may stop CCM services at any time (effective at the end of the month) by phone call to the office staff. The patient will be responsible for cost sharing (co-pay) of up to 20% of the service fee (after annual deductible is met).  Patient agreed to services and verbal consent obtained.   Follow up plan: Telephone appointment with care management team member scheduled for:06/19/2021  Noreene Larsson, [address], [place], [place] [address] Direct Dial: [phone] [email or web address]

## 2021-06-18 DIAGNOSIS — C44722 Squamous cell carcinoma of skin of right lower limb, including hip: Secondary | ICD-10-CM | POA: Diagnosis not present

## 2021-06-18 DIAGNOSIS — Z1283 Encounter for screening for malignant neoplasm of skin: Secondary | ICD-10-CM | POA: Diagnosis not present

## 2021-06-18 DIAGNOSIS — D225 Melanocytic nevi of trunk: Secondary | ICD-10-CM | POA: Diagnosis not present

## 2021-06-18 DIAGNOSIS — D485 Neoplasm of uncertain behavior of skin: Secondary | ICD-10-CM | POA: Diagnosis not present

## 2021-06-19 ENCOUNTER — Ambulatory Visit (INDEPENDENT_AMBULATORY_CARE_PROVIDER_SITE_OTHER): Payer: Medicare Other | Admitting: Pharmacist

## 2021-06-19 DIAGNOSIS — I482 Chronic atrial fibrillation, unspecified: Secondary | ICD-10-CM

## 2021-06-19 NOTE — Progress Notes (Signed)
Chronic Care Management Pharmacy Note  06/19/2021 Name:  Susan Fitzgerald MRN:  [id number] DOB:  1932/01/02  Summary: MED MANAGEMENT/ASSIST  Recommendations/Changes made from today's visit:  Atrial Fibrillation: Controlled; current rate/rhythm control: dilt; anticoagulant treatment: ELIQUIS Patient was getting through [facility], however they stated they were no longer servicing the patient Call made to [employer] patient assistance program for eliquis--she is enrolled in program until 11/17/21 Call placed to health dept---they will continue to receive her refills for eliquis (not synthroid--we will call synthroid in to local pharmacy, generic $4) Patient will have to re-enroll for eliquis again next year CHADS2VASc score: 5 Home blood pressure, heart rate readings: 110s/60-70s Denies signs & symptoms of bleeding  Follow Up Plan: No further follow up required:    Subjective: Susan Fitzgerald is an 87 y.o. year old female who is a primary patient of [name], DO.  The CCM team was consulted for assistance with disease management and care coordination needs.    Engaged with patient by telephone for initial visit in response to provider referral for pharmacy case management and/or care coordination services.   Consent to Services:  The patient was given information about Chronic Care Management services, agreed to services, and gave verbal consent prior to initiation of services.  Please see initial visit note for detailed documentation.   Patient Care Team: [name], DO as PCP - General (Family Medicine) Minus Breeding, MD as PCP - Cardiology (Cardiology) Druscilla Brownie, MD as Consulting Physician (Dermatology) Hennie Duos, MD as Consulting Physician (Rheumatology) Marlaine Hind, MD as Consulting Physician (Physical Medicine and Rehabilitation) Lavera Guise, [facility] as Pharmacist (Family Medicine)   Objective:  Lab Results  Component  Value Date   CREATININE 1.09 (H) 03/19/2021   CREATININE 1.09 (H) 09/15/2020   CREATININE 1.31 (H) 06/07/2020    Lab Results  Component Value Date   HGBA1C 5.0 12/04/2018   Last diabetic Eye exam:  Lab Results  Component Value Date/Time   HMDIABEYEEXA No Retinopathy 07/12/2013 12:00 AM    Last diabetic Foot exam: No results found for: HMDIABFOOTEX      Component Value Date/Time   CHOL 172 09/15/2020 0845   CHOL 131 04/14/2013 0836   TRIG 59 09/15/2020 0845   TRIG 67 09/15/2017 0845   TRIG 69 04/14/2013 0836   HDL 79 09/15/2020 0845   HDL 71 09/15/2017 0845   HDL 53 04/14/2013 0836   CHOLHDL 2.2 09/15/2020 0845   LDLCALC 81 09/15/2020 0845   LDLCALC 64 04/14/2013 0836   LDLDIRECT 98 04/20/2014 1015    Hepatic Function Latest Ref Rng & Units 03/19/2021 09/15/2020 06/07/2020  Total Protein 6.0 - 8.5 g/dL 6.4 6.6 6.7  Albumin 3.6 - 4.6 g/dL 4.1 4.0 3.8  AST 0 - 40 IU/L '21 23 22  ' ALT 0 - 32 IU/L '11 11 10  ' Alk Phosphatase 44 - 121 IU/L 81 74 64  Total Bilirubin 0.0 - 1.2 mg/dL 0.4 0.4 0.5  Bilirubin, Direct 0.00 - 0.40 mg/dL - - -    Lab Results  Component Value Date/Time   TSH 1.420 03/19/2021 09:01 AM   TSH 1.950 09/15/2020 08:45 AM   FREET4 1.44 03/19/2021 09:01 AM   FREET4 0.79 08/15/2017 12:00 PM    CBC Latest Ref Rng & Units 03/19/2021 09/15/2020 06/07/2020  WBC 3.4 - 10.8 x10E3/uL 5.6 5.1 4.1  Hemoglobin 11.1 - 15.9 g/dL 13.3 13.4 12.5  Hematocrit 34.0 - 46.6 % 38.9 39.8  37.0  Platelets 150 - 450 x10E3/uL 138(L) 149(L) 133.0(L)    Lab Results  Component Value Date/Time   VD25OH 49.4 04/20/2019 08:28 AM   VD25OH 37.5 09/15/2017 08:45 AM    Clinical ASCVD: No  The ASCVD Risk score Mikey Bussing DC Jr., et al., 2013) failed to calculate for the following reasons:   The 2013 ASCVD risk score is only valid for ages 80 to 37    Other: (CHADS2VASc if Afib, PHQ9 if depression, MMRC or CAT for COPD, ACT, DEXA)  Social History   Tobacco Use  Smoking Status Never   Smokeless Tobacco Never   BP Readings from Last 3 Encounters:  05/29/21 117/69  05/22/21 102/67  05/01/21 (!) 106/58   Pulse Readings from Last 3 Encounters:  05/29/21 (!) 57  05/22/21 79  05/01/21 94   Wt Readings from Last 3 Encounters:  05/29/21 117 lb (53.1 kg)  05/22/21 118 lb 12.8 oz (53.9 kg)  05/01/21 119 lb 9.6 oz (54.3 kg)    Assessment: Review of patient past medical history, allergies, medications, health status, including review of consultants reports, laboratory and other test data, was performed as part of comprehensive evaluation and provision of chronic care management services.   SDOH:  (Social Determinants of Health) assessments and interventions performed:    CCM Care Plan  Allergies  Allergen Reactions   Benazepril Hcl Cough    Medications Reviewed Today     Reviewed by Lavera Guise, [facility] (Pharmacist) on 06/21/21 at [phone]  Med List Status: <None>   Medication Order Taking? Sig Documenting Provider Last Dose Status Informant  apixaban (ELIQUIS) 2.5 MG TABS tablet [id number] No Take 1 tablet (2.5 mg total) by mouth 2 (two) times daily. Ronnie Doss M, DO Taking Active   azaTHIOprine (IMURAN) 50 MG tablet [id number] No Take 50 mg by mouth daily.  [provider] Taking Active Self  calcium-vitamin D (OSCAL WITH D) 250-125 MG-UNIT per tablet [id number] No Take 1 tablet by mouth 2 (two) times daily. [provider] Taking Active Self  cephALEXin (KEFLEX) 500 MG capsule [id number]  Take 1 capsule (500 mg total) by mouth 2 (two) times daily. Lodema Pilot, PA-C  Active   cyanocobalamin ((VITAMIN B-12)) injection 1,000 mcg [id number]   Ronnie Doss M, DO  Active   diltiazem (CARDIZEM CD) 300 MG 24 hr capsule [id number] No Take 1 capsule (300 mg total) by mouth daily. Minus Breeding, MD Taking Active   ESBRIET 267 MG TABS [id number] No Take 3 tablets (801 mg total) by mouth 3 (three) times daily. Marshell Garfinkel, MD Taking Active    furosemide (LASIX) 20 MG tablet [id number] No TAKE 1 TO 2 TABLETS BY  MOUTH DAILY AS NEEDED  Patient taking differently: Take 20 mg by mouth daily.   Ronnie Doss M, DO Taking Active   gabapentin (NEURONTIN) 100 MG capsule [id number] No Take 100 mg by mouth at bedtime.  [provider] Taking Active Self           Med Note Vianne Bulls Jan 26, 2020  4:08 PM)    levothyroxine (SYNTHROID) 75 MCG tablet [id number] No TAKE 1 TABLET BY MOUTH  DAILY Ronnie Doss M, DO Taking Active   lisinopril (ZESTRIL) 20 MG tablet [id number] No TAKE 1 TABLET BY MOUTH  DAILY  Patient taking differently: Take 20 mg by mouth at bedtime.   Minus Breeding, MD Taking Active   lisinopril-hydrochlorothiazide (ZESTORETIC) 20-12.5 MG tablet [id number] No TAKE 1 TABLET  BY MOUTH  DAILY IN THE Alinda Money, MD Taking Active   Melatonin 5 MG TABS [id number] No Take 5 mg by mouth at bedtime. [provider] Taking Active Self  Multiple Vitamin (MULTIVITAMIN) capsule [id number] No Take 1 capsule by mouth daily. [provider] Taking Active Self  potassium chloride SA (KLOR-CON) 20 MEQ tablet [id number] No TAKE 1 TABLET BY MOUTH  DAILY Ronnie Doss M, DO Taking Active   Probiotic Product (PROBIOTIC-10 ULTIMATE PO) [id number] No Take 1 tablet by mouth daily. [provider] Taking Active Self            Patient Active Problem List   Diagnosis Date Noted   Educated about COVID-19 virus infection 12/20/2019   Medication management 10/05/2018   Cough 09/30/2018   Calcification of bronchial airway 06/23/2018   Right ventricular enlargement 06/23/2018   Interstitial pulmonary disease ([place]) 06/23/2018   Elevated liver enzymes 06/23/2018   Cerebral vascular disease 06/19/2018   New onset of confusion 06/08/2018   Weakness 02/25/2018   Hypothyroidism 09/18/2017   Spinal stenosis of lumbar region with neurogenic claudication 03/20/2017   Atrial fibrillation,  persistent ([place]) 02/10/2017   Avascular necrosis of hip, right ([place]) 01/27/2017   Avascular necrosis of bone of right hip ([place]) 01/27/2017   Abdominal aortic atherosclerosis ([place]) 09/24/2016   Rheumatoid arthritis ([place]) 05/07/2016   Symptomatic bradycardia 06/02/2013   Essential hypertension 04/21/2013   Osteoporosis 04/21/2013   Atrial fibrillation, chronic ([place]) 02/09/2013    Immunization History  Administered Date(s) Administered   Fluad Quad(high Dose 65+) 07/20/2019, 08/23/2020   Influenza, High Dose Seasonal PF 09/20/2016, 09/19/2017, 08/25/2018   Influenza,inj,Quad PF,6+ Mos 08/23/2013, 09/05/2014, 08/28/2015   Moderna SARS-COV2 Booster Vaccination 09/08/2020   PFIZER(Purple Top)SARS-COV-2 Vaccination 12/24/2019, 01/19/2020   Pneumococcal Conjugate-13 09/05/2014   Pneumococcal Polysaccharide-23 10/28/2018   Td 09/02/2011   Tdap 09/02/2011   Zoster Recombinat (Shingrix) 11/13/2018, 03/15/2019    Conditions to be addressed/monitored: Atrial Fibrillation  Care Plan : PHARMD MEDICATION MANAGMENT  Updates made by Lavera Guise, [place] since 06/21/2021 12:00 AM     Problem: DISEASE PROGRESSION PREVENTION      Long-Range Goal: AFIB   Note:   Current Barriers:  Unable to independently afford treatment regimen  Pharmacist Clinical Goal(s):  Over the next 90 days, patient will verbalize ability to afford treatment regimen through collaboration with PharmD and provider.    Interventions: 1:1 collaboration with [name], DO regarding development and update of comprehensive plan of care as evidenced by provider attestation and co-signature Inter-disciplinary care team collaboration (see longitudinal plan of care) Comprehensive medication review performed; medication list updated in electronic medical record  Atrial Fibrillation: Controlled; current rate/rhythm control: dilt; anticoagulant treatment: ELIQUIS Patient was getting through [facility], however they stated they were no longer servicing the patient Call made to [employer] patient assistance program for eliquis--she is enrolled in program until 11/17/21 Call placed to health dept---they will continue to receive her refills for eliquis (not synthroid--we will call synthroid in to local pharmacy, generic $4) Patient will have to re-enroll for eliquis again next year CHADS2VASc score: 5 Home blood pressure, heart rate readings: 110s/60-70s Denies signs & symptoms of bleeding  Patient Goals/Self-Care Activities Over the next 90 days, patient will:  - take medications as prescribed  Follow Up Plan: No further follow up required:          Medication Assistance: [facility] DEPT  Patient's preferred pharmacy is:  [employer]  [facility]) - [place], [place] [address] Phone: [phone] Fax: [place], [place] [address] Phone: [phone] Fax: [phone]  [facility] - [place], [place] [address] [place] [address] Phone: [phone] Fax: [phone]   Follow Up:  Patient agrees to Care Plan and Follow-up.  Plan: Telephone follow up appointment with care management team member scheduled for:  10/2021-WILL ATTEMPT TO RE-ENROLL  Regina Eck, PharmD, BCPS Clinical Pharmacist, [place]  II Phone [phone]

## 2021-06-21 MED ORDER — LEVOTHYROXINE SODIUM 75 MCG PO TABS: 75.0000 ug | ORAL_TABLET | Freq: Every day | ORAL | 2 refills | Status: DC

## 2021-06-21 NOTE — Patient Instructions (Signed)
Visit Information  PATIENT GOALS:  Goals Addressed               This Visit's Progress     Patient Stated     AFIB (pt-stated)        Current Barriers:  Unable to independently afford treatment regimen  Pharmacist Clinical Goal(s):  Over the next 90 days, patient will verbalize ability to afford treatment regimen through collaboration with PharmD and provider.    Interventions: 1:1 collaboration with [name], DO regarding development and update of comprehensive plan of care as evidenced by provider attestation and co-signature Inter-disciplinary care team collaboration (see longitudinal plan of care) Comprehensive medication review performed; medication list updated in electronic medical record  Atrial Fibrillation: Controlled; current rate/rhythm control: dilt; anticoagulant treatment: ELIQUIS Patient was getting through [facility], however they stated they were no longer servicing the patient Call made to [employer] patient assistance program for eliquis--she is enrolled in program until 11/17/21 Call placed to health dept---they will continue to receive her refills for eliquis (not synthroid--we will call synthroid in to local pharmacy, generic $4) Patient will have to re-enroll for eliquis again next year CHADS2VASc score: 5 Home blood pressure, heart rate readings: 110s/60-70s Denies signs & symptoms of bleeding  Patient Goals/Self-Care Activities Over the next 90 days, patient will:  - take medications as prescribed  Follow Up Plan: No further follow up required:           The patient verbalized understanding of instructions, educational materials, and care plan provided today and declined offer to receive copy of patient instructions, educational materials, and care plan.   Telephone follow up appointment with care management team member scheduled for: 10/2021  Signature Regina Eck, PharmD, BCPS Clinical Pharmacist, [place]  II Phone [phone]

## 2021-06-27 ENCOUNTER — Other Ambulatory Visit: Payer: Self-pay

## 2021-06-27 ENCOUNTER — Other Ambulatory Visit: Payer: Medicare Other

## 2021-06-27 DIAGNOSIS — M0579 Rheumatoid arthritis with rheumatoid factor of multiple sites without organ or systems involvement: Secondary | ICD-10-CM | POA: Diagnosis not present

## 2021-07-26 ENCOUNTER — Telehealth: Payer: Self-pay | Admitting: Pulmonary Disease

## 2021-07-26 DIAGNOSIS — Z5181 Encounter for therapeutic drug level monitoring: Secondary | ICD-10-CM

## 2021-07-26 DIAGNOSIS — J849 Interstitial pulmonary disease, unspecified: Secondary | ICD-10-CM

## 2021-07-27 MED ORDER — PIRFENIDONE 801 MG PO TABS: 801.0000 mg | ORAL_TABLET | Freq: Three times a day (TID) | ORAL | 0 refills | Status: DC

## 2021-07-27 NOTE — Telephone Encounter (Addendum)
Patient has not been seen in clinic since July 2021.  She is also overdue for updated LFTs while taking pirfenidone. Future lab order placed for hepatic panel.  Rx sent to [facility] for 3 months only.   CMP Latest Ref Rng & Units 03/19/2021 09/15/2020 06/07/2020  Glucose 65 - 99 mg/dL 81 80 86  BUN 8 - 27 mg/dL 24 26 28(H)  Creatinine 0.57 - 1.00 mg/dL 1.09(H) 1.09(H) 1.31(H)  Sodium 134 - 144 mmol/L 144 140 140  Potassium 3.5 - 5.2 mmol/L 4.0 3.7 4.3  Chloride 96 - 106 mmol/L 103 100 103  CO2 20 - 29 mmol/L 27 27 32  Calcium 8.7 - 10.3 mg/dL 9.7 9.6 9.4  Total Protein 6.0 - 8.5 g/dL 6.4 6.6 6.7  Total Bilirubin 0.0 - 1.2 mg/dL 0.4 0.4 0.5  Alkaline Phos 44 - 121 IU/L 81 74 64  AST 0 - 40 IU/L 21 23 22   ALT 0 - 32 IU/L 11 11 10    Routing to triage for appt with Dr. Wendie Simmer, PharmD, MPH, BCPS Clinical Pharmacist (Rheumatology and Pulmonology)

## 2021-07-27 NOTE — Telephone Encounter (Signed)
Called and spoke with pt about message received from pharmacist and have scheduled pt a f/u with Dr. [name] Browner. Nothing further needed.

## 2021-07-27 NOTE — Addendum Note (Signed)
Addended by: Cassandria Anger on: 07/27/2021 01:59 PM   Modules accepted: Orders

## 2021-07-28 NOTE — Telephone Encounter (Signed)
Thanks. Please order PFTs and HRCT if possible to be done before the visit

## 2021-08-08 ENCOUNTER — Other Ambulatory Visit (INDEPENDENT_AMBULATORY_CARE_PROVIDER_SITE_OTHER): Payer: Medicare Other

## 2021-08-08 ENCOUNTER — Other Ambulatory Visit: Payer: Self-pay

## 2021-08-08 ENCOUNTER — Other Ambulatory Visit (HOSPITAL_COMMUNITY): Payer: Medicare Other

## 2021-08-08 DIAGNOSIS — D696 Thrombocytopenia, unspecified: Secondary | ICD-10-CM

## 2021-08-08 DIAGNOSIS — Z5181 Encounter for therapeutic drug level monitoring: Secondary | ICD-10-CM

## 2021-08-08 LAB — COMPREHENSIVE METABOLIC PANEL
ALT: 11 IU/L (ref 0–32)
AST: 22 IU/L (ref 0–40)
Albumin/Globulin Ratio: 1.6 (ref 1.2–2.2)
Albumin: 4 g/dL (ref 3.6–4.6)
Alkaline Phosphatase: 70 IU/L (ref 44–121)
BUN/Creatinine Ratio: 20 (ref 12–28)
BUN: 25 mg/dL (ref 8–27)
Bilirubin Total: 0.4 mg/dL (ref 0.0–1.2)
CO2: 27 mmol/L (ref 20–29)
Calcium: 9.7 mg/dL (ref 8.7–10.3)
Chloride: 99 mmol/L (ref 96–106)
Creatinine, Ser: 1.22 mg/dL — ABNORMAL HIGH (ref 0.57–1.00)
Globulin, Total: 2.5 g/dL (ref 1.5–4.5)
Glucose: 81 mg/dL (ref 65–99)
Potassium: 3.6 mmol/L (ref 3.5–5.2)
Sodium: 141 mmol/L (ref 134–144)
Total Protein: 6.5 g/dL (ref 6.0–8.5)
eGFR: 43 mL/min/{1.73_m2} — ABNORMAL LOW (ref >59)

## 2021-08-08 NOTE — Addendum Note (Signed)
Addended by: Reather Converse on: 08/08/2021 08:50 AM   Modules accepted: Orders

## 2021-08-08 NOTE — Addendum Note (Signed)
Addended by: Reather Converse on: 08/08/2021 08:51 AM   Modules accepted: Orders

## 2021-08-10 LAB — CBC WITH DIFFERENTIAL/PLATELET
Basophils Absolute: 0.1 10*3/uL (ref 0.0–0.2)
Basos: 1 %
EOS (ABSOLUTE): 0.1 10*3/uL (ref 0.0–0.4)
Eos: 3 %
Hematocrit: 38.5 % (ref 34.0–46.6)
Hemoglobin: 13.3 g/dL (ref 11.1–15.9)
Immature Grans (Abs): 0 10*3/uL (ref 0.0–0.1)
Immature Granulocytes: 0 %
Lymphocytes Absolute: 1.3 10*3/uL (ref 0.7–3.1)
Lymphs: 26 %
MCH: 33.1 pg — ABNORMAL HIGH (ref 26.6–33.0)
MCHC: 34.5 g/dL (ref 31.5–35.7)
MCV: 96 fL (ref 79–97)
Monocytes Absolute: 0.6 10*3/uL (ref 0.1–0.9)
Monocytes: 12 %
Neutrophils Absolute: 2.9 10*3/uL (ref 1.4–7.0)
Neutrophils: 58 %
Platelets: 151 10*3/uL (ref 150–450)
RBC: 4.02 x10E6/uL (ref 3.77–5.28)
RDW: 11.9 % (ref 11.7–15.4)
WBC: 4.9 10*3/uL (ref 3.4–10.8)

## 2021-08-10 LAB — METHYLMALONIC ACID, SERUM: Methylmalonic Acid: 136 nmol/L (ref 0–378)

## 2021-08-10 LAB — FOLATE: Folate: >20 ng/mL (ref >3.0)

## 2021-08-10 LAB — VITAMIN B12: Vitamin B-12: 1477 pg/mL — ABNORMAL HIGH (ref 232–1245)

## 2021-08-13 ENCOUNTER — Encounter: Payer: Self-pay | Admitting: Pulmonary Disease

## 2021-08-13 ENCOUNTER — Other Ambulatory Visit: Payer: Self-pay

## 2021-08-13 ENCOUNTER — Ambulatory Visit: Payer: Medicare Other | Admitting: Pulmonary Disease

## 2021-08-13 VITALS — BP 118/72 | HR 103 | Temp 97.8°F | Ht 60.0 in | Wt 117.8 lb

## 2021-08-13 DIAGNOSIS — J849 Interstitial pulmonary disease, unspecified: Secondary | ICD-10-CM

## 2021-08-13 DIAGNOSIS — Z23 Encounter for immunization: Secondary | ICD-10-CM | POA: Diagnosis not present

## 2021-08-13 DIAGNOSIS — M0579 Rheumatoid arthritis with rheumatoid factor of multiple sites without organ or systems involvement: Secondary | ICD-10-CM

## 2021-08-13 DIAGNOSIS — Z5181 Encounter for therapeutic drug level monitoring: Secondary | ICD-10-CM

## 2021-08-13 NOTE — Progress Notes (Signed)
CT               Susan Fitzgerald    [id number]    05-11-1932  Primary Care Physician:Gottschalk, Koleen Distance, DO  Referring Physician: [name], DO [place],  [place] [address]  Problem list: RA ILD On imuran Ofev poorly tolerated due to side effects On Esbriet since December 1252  HPI: 87 year old with past medical history of atrial fibrillation, rheumatoid arthritis, pulmonary fibrosis.  Referred here for a second opinion on pulmonary fibrosis from Dr. Valeta Harms  She has been diagnosed with rheumatoid arthritis around 2017.  She follows with Dr. Amil Amen.  She was initially on methotrexate which was held briefly in 2019 due to elevated creatinine, but restarted around November 2019 for worsening symptoms.  She did not tolerate leflunomide due to significant hair loss.  Methotrexate was stopped and she was started started on azathioprine after discussion with Dr. 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  This was poorly tolerated due to diarrhea, constipation and lower GI bleed Antifibrotic's change to Esbriet in December 2020 which is tolerated much better than Ofev  Finished pulmonary rehab in 2020 with improvement in symptoms of dyspnea.  She is back to golfing  Diagnosed with COVID-07 October 2019.  Did not require hospitalization. Follows with Dr. Percival Spanish for management of atrial fibrillation  Extended ILD questionnaire 02/12/2019-no known exposures, no mold, hot tub, Jacuzzi, humidifier, no birds at home. Smoking history: Never smoker Travel history: No significant travel history Relevant family history: No family history of lung disease or pulmonary fibrosis.  Interim history: Continues on Esbriet.  This was changed to generic pirfenidone in summer 2022.  She does not like the full effect as the pills are bigger and wants to go back to brand-name Esbriet  Overall she is doing well  with good exercise capacity.  Continues to golf several times a week. Dyspnea on exertion is stable.  Outpatient Encounter Medications as of 08/13/2021  Medication Sig   apixaban (ELIQUIS) 2.5 MG TABS tablet Take 1 tablet (2.5 mg total) by mouth 2 (two) times daily.   azaTHIOprine (IMURAN) 50 MG tablet Take 50 mg by mouth daily.    calcium-vitamin D (OSCAL WITH D) 250-125 MG-UNIT per tablet Take 1 tablet by mouth 2 (two) times daily.   diltiazem (CARDIZEM CD) 300 MG 24 hr capsule Take 1 capsule (300 mg total) by mouth daily.   furosemide (LASIX) 20 MG tablet TAKE 1 TO 2 TABLETS BY  MOUTH DAILY AS NEEDED (Patient taking differently: Take 20 mg by mouth daily.)   gabapentin (NEURONTIN) 100 MG capsule Take 100 mg by mouth at bedtime.    levothyroxine (SYNTHROID) 75 MCG tablet Take 1 tablet (75 mcg total) by mouth daily.   lisinopril (ZESTRIL) 20 MG tablet TAKE 1 TABLET BY MOUTH  DAILY (Patient taking differently: Take 20 mg by mouth at bedtime.)   lisinopril-hydrochlorothiazide (ZESTORETIC) 20-12.5 MG tablet TAKE 1 TABLET BY MOUTH  DAILY IN THE MORNING   Melatonin 5 MG TABS Take 5 mg by mouth at bedtime.   Multiple Vitamin (MULTIVITAMIN) capsule Take 1 capsule by mouth daily.   Pirfenidone (ESBRIET) 801 MG TABS Take 801 mg by mouth 3 (three) times daily.   potassium chloride SA (KLOR-CON) 20 MEQ tablet TAKE 1 TABLET BY MOUTH  DAILY   Probiotic Product (PROBIOTIC-10 ULTIMATE PO) Take 1 tablet by mouth daily.   [DISCONTINUED] cephALEXin (  KEFLEX) 500 MG capsule Take 1 capsule (500 mg total) by mouth 2 (two) times daily.   Facility-Administered Encounter Medications as of 08/13/2021  Medication   cyanocobalamin ((VITAMIN B-12)) injection 1,000 mcg   Physical Exam: Blood pressure 118/72, pulse (!) 103, temperature 97.8 F (36.6 C), temperature source Oral, height 5' (1.524 m), weight 117 lb 12.8 oz (53.4 kg), SpO2 97 %. Gen:      No acute distress HEENT:  EOMI, sclera anicteric Neck:     No  masses; no thyromegaly Lungs:    Mild bibasal crackles CV:         Regular rate and rhythm; no murmurs Abd:      + bowel sounds; soft, non-tender; no palpable masses, no distension Ext:    No edema; adequate peripheral perfusion Skin:      Warm and dry; no rash Neuro: alert and oriented x 3 Psych: normal mood and affect   Data Reviewed: Imaging: CT chest 02/09/2017- mild reticulation, interstitial prominence at the bases. CT chest 06/23/2018- groundglass attenuation, interlobular septal thickening, subpleural reticulation mild air trapping. CT chest 01/13/2019- peripheral and basilar pattern of subpleural reticulation, groundglass and traction bronchiectasis.  No honeycombing.  Probable UIP pattern. CT high-resolution 08/31/2019-stable pulmonary fibrosis probable UIP pattern CT high-resolution 09/06/2020-stable probable UIP pattern pulmonary fibrosis I reviewed the images personally.  PFTs: 01/20/2019 FVC 2.17 [114%), FEV1 1.67 [120%],/F 77, TLC 78%, DLCO 56% Minimal restriction with moderate diffusion impairment.  06/05/2020 FVC 2.17 [119%], FEV1 1.68 [127%], F/F 77, TLC 4.36 [99%], DLCO 10.30 [65%] Mild diffusion impairment.  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  Other considerations include amiodarone toxicity but she has been off amiodarone for some time now.  Methotrexate can cause pulmonary fibrosis in this pattern as well. Would not recommend work-up for IPF such as lung biopsy as it would not change our recommended management.   Did not tolerate Ofev but is doing well with esbriet.  She wants to go back on brand-name Esbriet and I will check with pharmacy if this can be done. Finished pulmonary rehab and remains active with golf Recent CBC and hepatic panel on 08/08/2021 are  normal.  Follow-up high-resolution CT  Health maintenance Flu vaccine today Up-to-date with COVID-19 She is going to get the bivalent in the next few weeks.  Plan/Recommendations: - Continue pirfenidone.  Attempt to go back to brand-name Esbriet per patient preference - High-res CT follow-up  Marshell Garfinkel MD [facility] 08/13/2021, 11:29 AM  CC: [name], DO

## 2021-08-13 NOTE — Patient Instructions (Signed)
I am glad you are doing well I will check with pharmacy if he can be put back on brand-name Esbriet  Will order follow-up high-resolution CT in the next month for reevaluation of your lung disease We will give a flu shot today  Follow-up in 3 to 4 months.

## 2021-08-14 NOTE — Progress Notes (Signed)
Virtual Visit via Telephone Note [facility]  I connected with Susan Fitzgerald  on 08/15/21 at  9:59 AM by telephone and verified that I am speaking with the correct person using two identifiers.  Location: Patient: Home Provider: [facility]   I discussed the limitations, risks, security and privacy concerns of performing an evaluation and management service by telephone and the availability of in person appointments. I also discussed with the patient that there may be a patient responsible charge related to this service. The patient expressed understanding and agreed to proceed.   HISTORY OF PRESENT ILLNESS: Susan Fitzgerald follows at our clinic for thrombocytopenia.  She was last evaluated via telemedicine visit by Tarri Abernethy PA-C on 05/02/2021.  At today's phone visit, she reports feeling well.  She denies any recent hospitalizations, surgeries, or changes in her baseline health status.  She admits to easy bruising but denies petechial rash.  She reports that she bleeds easily when she cuts her self, but denies any abnormal bleeding such as epistaxis, hematemesis, hematochezia, melena, or hematuria.  She continues to take Eliquis for her atrial fibrillation.  She denies any B symptoms such as fever, chills, night sweats, unintentional weight loss.  She denies any new masses or lymphadenopathy.  She remains active and golfs several times per week.  She reports 60% energy and 50% appetite.  She is maintaining stable weight at this time.     OBSERVATIONS/OBJECTIVE: Review of Systems  Constitutional:  Negative for chills, diaphoresis, fever, malaise/fatigue and weight loss.  Respiratory:  Positive for shortness of breath (with exertion due to pulmonary fibrosis). Negative for cough.   Cardiovascular:  Positive for palpitations (Afib). Negative for chest pain.  Gastrointestinal:  Negative for abdominal pain, blood in stool, melena, nausea and  vomiting.  Neurological:  Positive for tingling (numbness in right leg, chronic and stable). Negative for dizziness and headaches.  Psychiatric/Behavioral:  The patient has insomnia.     PHYSICAL EXAM (per limitations of virtual telephone visit): The patient is alert and oriented x 3, exhibiting adequate mentation, good mood, and ability to speak in full sentences and execute sound judgement.   ASSESSMENT & PLAN: 1.  Mild thrombocytopenia - Per review of labs, she has had mild thrombocytopenia for the last 8 months with platelets 133 (06/07/2020) and more recently platelets 138 (03/19/2021). - She did have a self-limited episode of mild thrombocytopenia in March 2018, with lowest platelets 106 (01/29/2017). - She also has slight elevation in her MCV at 99.0, which may indicate nutritional deficiency (such as H63 or folic acid) or early MDS, which may also be affecting her platelets. - She does not have any anemia, hemoglobin derangements, or WBC abnormalities apparent on CBC. - Most recent abdominal imaging via abdominal ultrasound (09/24/2016): Spleen size and appearance within normal limits - Hematology work-up (04/11/2021):  Peripheral smear confirms thrombocytopenia H pylori and hepatitis negative No nutritional deficiencies (unremarkable folate, B12, methylmalonic acid, copper) - Most recent labs (08/08/2021) show normal platelets 151.  B12 elevated at 1477, likely reactive.  Normal methylmalonic acid.  Normal folate. - Differential diagnosis includes immune-mediated thrombocytopenia (positive history of rheumatoid arthritis), drug-induced isolated thrombocytopenia (azathioprine), and early MDS - No treatment indicated at this time.  Treatment for thrombocytopenia will be considered if platelets were less than 30, or in the event of significant bleeding. - PLAN: Platelet count is currently normal.  We will repeat labs with office visit in 6 months.  2. Other history - PMH:  Chronic atrial  fibrillation (Eliquis), pulmonary fibrosis (pirfenidone), rheumatoid arthritis (azathioprine),  and other comorbidities noted elsewhere in medical record -[name] lives at home with her husband and is functionally independent.  She enjoys an active retirement and plays golf several times per week.  She is retired from work as Agricultural engineer. - Lifelong nonsmoker; no alcohol or illicit drugs.  No chemical or pesticide exposure. - She denies any family history of blood disorders. Positive for heart disease and stroke.  The patient's daughter is deceased from breast cancer at age 4.  Patient's maternal aunt also had breast cancer.  Her brother had colon cancer.   FOLLOW UP INSTRUCTIONS: Labs in 6 months. RTC after labs.   I discussed the assessment and treatment plan with the patient. The patient was provided an opportunity to ask questions and all were answered. The patient agreed with the plan and demonstrated an understanding of the instructions.   The patient was advised to call back or seek an in-person evaluation if the symptoms worsen or if the condition fails to improve as anticipated.  I provided 18 minutes of non-face-to-face time during this encounter.   Harriett Rush, PA-C 08/15/21 10:17 AM

## 2021-08-15 ENCOUNTER — Inpatient Hospital Stay (HOSPITAL_COMMUNITY): Payer: Medicare Other | Attending: Physician Assistant | Admitting: Physician Assistant

## 2021-08-15 ENCOUNTER — Other Ambulatory Visit: Payer: Self-pay

## 2021-08-15 ENCOUNTER — Telehealth: Payer: Self-pay | Admitting: Pharmacist

## 2021-08-15 ENCOUNTER — Other Ambulatory Visit (HOSPITAL_COMMUNITY): Payer: Self-pay

## 2021-08-15 DIAGNOSIS — D696 Thrombocytopenia, unspecified: Secondary | ICD-10-CM

## 2021-08-15 DIAGNOSIS — J849 Interstitial pulmonary disease, unspecified: Secondary | ICD-10-CM

## 2021-08-15 NOTE — Telephone Encounter (Addendum)
Called patient and reviewed Rachael's findings. Advised that we can try test claim again on 08/19/21. Patient is enrolled into grant for cost assistance   Will transition this into telephone encounter for f/u on 08/19/21   Knox Saliva, PharmD, MPH, BCPS Clinical Pharmacist (Rheumatology and Pulmonology)   ----- Message from Beatriz Chancellor, CPhT sent at 08/15/2021  1:45 PM EDT ----- Ran test claim for [employer], received a refill too soon. Next refill is due 08/19/21. Can try test claim then, if rejects we can try/ PA or PAP ----- Message ----- From: Marshell Garfinkel, MD Sent: 08/13/2021  11:53 AM EDT To: Rx Rheum/Pulm  She is on generic pirfenidone and does not like it as the pills are bigger.  She wants to go back on the brand-name Esbriet.  Can you look into it and see if we can make a switch without affecting cost or her patient assistance?

## 2021-08-15 NOTE — Progress Notes (Signed)
Called patient and reviewed Rachael's findings. Advised that we can try test claim again on 08/19/21. Patient is enrolled into grant for cost assistance  Will transition this into telephone encounter for f/u on 08/19/21  Knox Saliva, PharmD, MPH, BCPS Clinical Pharmacist (Rheumatology and Pulmonology)

## 2021-08-20 ENCOUNTER — Other Ambulatory Visit (HOSPITAL_COMMUNITY): Payer: Self-pay

## 2021-08-20 MED ORDER — ESBRIET 801 MG PO TABS: 801.0000 mg | ORAL_TABLET | Freq: Three times a day (TID) | ORAL | 1 refills | Status: DC

## 2021-08-20 NOTE — Telephone Encounter (Signed)
eGFR is a value that indicates kidney function. If is slightly low bit stable from earlier this year.

## 2021-08-20 NOTE — Telephone Encounter (Signed)
Rx for brand name Esbriet sent to [facility].  Called patient to notify that rx for brand Esbriet sent to pharmacy  Knox Saliva, PharmD, MPH, BCPS Clinical Pharmacist (Rheumatology and Pulmonology)

## 2021-08-20 NOTE — Telephone Encounter (Signed)
PM please advise. Thanks   What does eGFR indicate on lab test?

## 2021-08-20 NOTE — Telephone Encounter (Signed)
Ran test claim, plan will cover Brand as a DAW 1. Copay is $558.05 and patient has grant to assist.

## 2021-09-04 ENCOUNTER — Telehealth: Payer: Self-pay | Admitting: Pulmonary Disease

## 2021-09-04 DIAGNOSIS — J849 Interstitial pulmonary disease, unspecified: Secondary | ICD-10-CM

## 2021-09-04 NOTE — Telephone Encounter (Signed)
Pharmacy calling stating that the pt would like to change back to the esbriet 200 mg  due to the size of the tablet.  PM please advise.  thanks

## 2021-09-05 NOTE — Telephone Encounter (Signed)
I think she means going back to the 267 mg tabs and not the large 801 mg tabs due to size. She will have to take 3 tabs three times/day  Will CC the pharmacy team to make the change in the prescription.

## 2021-09-05 NOTE — Telephone Encounter (Signed)
routing

## 2021-09-10 ENCOUNTER — Other Ambulatory Visit: Payer: Self-pay

## 2021-09-10 ENCOUNTER — Ambulatory Visit (INDEPENDENT_AMBULATORY_CARE_PROVIDER_SITE_OTHER)
Admission: RE | Admit: 2021-09-10 | Discharge: 2021-09-10 | Disposition: A | Discharge status: Home / Self Care | Payer: Medicare Other | Source: Ambulatory Visit | Attending: Pulmonary Disease | Admitting: Pulmonary Disease

## 2021-09-10 DIAGNOSIS — R918 Other nonspecific abnormal finding of lung field: Secondary | ICD-10-CM | POA: Diagnosis not present

## 2021-09-10 DIAGNOSIS — I700 Atherosclerosis of aorta: Secondary | ICD-10-CM | POA: Diagnosis not present

## 2021-09-10 DIAGNOSIS — R911 Solitary pulmonary nodule: Secondary | ICD-10-CM | POA: Diagnosis not present

## 2021-09-10 DIAGNOSIS — J849 Interstitial pulmonary disease, unspecified: Secondary | ICD-10-CM | POA: Diagnosis not present

## 2021-09-10 MED ORDER — ESBRIET 267 MG PO TABS: 801.0000 mg | ORAL_TABLET | Freq: Three times a day (TID) | ORAL | 5 refills | Status: DC

## 2021-09-10 NOTE — Telephone Encounter (Signed)
Rx sent to [facility] for Esbriet 267 mg - 3 tabs three times daily. Confirmed with patient that she does want to take 9 tablets daily and she said that is what she would prefer.  Knox Saliva, PharmD, MPH, BCPS Clinical Pharmacist (Rheumatology and Pulmonology)

## 2021-09-11 ENCOUNTER — Telehealth: Payer: Self-pay | Admitting: Pulmonary Disease

## 2021-09-11 NOTE — Telephone Encounter (Signed)
Patient's grant expired on 09/05/21.  Will have to pursue patient assistance through [place]. Patient would like her portion of application mailed to clinic  Will place provider portion in Dr. Matilde Bash mailbox to be signed  Patient just received a new bottle of Esbriet.  Patient states she will be in town on 09/19/21 for another appointment and will plan to drop off application.  Knox Saliva, PharmD, MPH, BCPS Clinical Pharmacist (Rheumatology and Pulmonology)

## 2021-09-14 ENCOUNTER — Telehealth: Payer: Self-pay | Admitting: Pulmonary Disease

## 2021-09-14 DIAGNOSIS — J849 Interstitial pulmonary disease, unspecified: Secondary | ICD-10-CM

## 2021-09-14 MED ORDER — ESBRIET 267 MG PO TABS: 801.0000 mg | ORAL_TABLET | Freq: Three times a day (TID) | ORAL | 5 refills | Status: DC

## 2021-09-14 NOTE — Telephone Encounter (Signed)
[place] in regards to pt's Esbriet medication to get the quantity fixed. Quantity stated was 180tabs which should have been 270tabs for a 30-day supply.  Spoke with Kirk Ruths, pharmacy technician letting her know what needed to be fixed about pt's Esbriet Rx and she said she would have me speak with a pharmacist. Was transferred to pharmacist Caryl Pina to verify the quantity that pt needed for her Esbriet. Nothing further needed.

## 2021-09-14 NOTE — Telephone Encounter (Signed)
Pt states [facility] called her stating that the 180 tablets of Esbriet sent in was not enough for a 30 day supply-pt takes 9 tablets a day so she needs 270.Pt also received paperwork for our office she needs to sign and return.Please advise [phone] [facility]

## 2021-09-17 ENCOUNTER — Other Ambulatory Visit: Payer: Self-pay | Admitting: *Deleted

## 2021-09-17 DIAGNOSIS — R911 Solitary pulmonary nodule: Secondary | ICD-10-CM

## 2021-09-17 NOTE — Telephone Encounter (Addendum)
Submitted Patient Assistance Application to [employer] for [employer] along with provider portion, med list and insurance card. Be advised that we do NOT have a copy of PA approval letter at this time. Will update patient when we receive a response.  Fax# [phone] Phone# [phone]

## 2021-09-21 NOTE — Telephone Encounter (Signed)
Reached out to [employer] and confirmed that application is still in-process. Verified that all relevant documents were received successfully. Will continue to f/u. 

## 2021-09-24 ENCOUNTER — Encounter: Payer: Self-pay | Admitting: Family Medicine

## 2021-09-24 ENCOUNTER — Other Ambulatory Visit: Payer: Self-pay

## 2021-09-24 ENCOUNTER — Ambulatory Visit (INDEPENDENT_AMBULATORY_CARE_PROVIDER_SITE_OTHER): Payer: Medicare Other | Admitting: Family Medicine

## 2021-09-24 VITALS — BP 106/69 | HR 79 | Temp 98.3°F | Resp 20 | Ht 60.0 in | Wt 119.0 lb

## 2021-09-24 DIAGNOSIS — I482 Chronic atrial fibrillation, unspecified: Secondary | ICD-10-CM

## 2021-09-24 DIAGNOSIS — N1831 Chronic kidney disease, stage 3a: Secondary | ICD-10-CM | POA: Diagnosis not present

## 2021-09-24 DIAGNOSIS — J849 Interstitial pulmonary disease, unspecified: Secondary | ICD-10-CM | POA: Diagnosis not present

## 2021-09-24 DIAGNOSIS — I700 Atherosclerosis of aorta: Secondary | ICD-10-CM | POA: Diagnosis not present

## 2021-09-24 DIAGNOSIS — L570 Actinic keratosis: Secondary | ICD-10-CM | POA: Diagnosis not present

## 2021-09-24 DIAGNOSIS — E039 Hypothyroidism, unspecified: Secondary | ICD-10-CM | POA: Diagnosis not present

## 2021-09-24 DIAGNOSIS — E538 Deficiency of other specified B group vitamins: Secondary | ICD-10-CM

## 2021-09-24 MED ORDER — APIXABAN 2.5 MG PO TABS: 2.5000 mg | ORAL_TABLET | Freq: Two times a day (BID) | ORAL | 3 refills | Status: DC

## 2021-09-24 NOTE — Progress Notes (Signed)
Subjective: CC: Follow-up B12 deficiency, hypothyroidism, atrial fibrillation PCP: [name], DO JYN:Susan Fitzgerald S Susan Fitzgerald is a 87 y.o. female presenting to clinic today for:  1.  B12 deficiency Patient has been monitored for this by her specialist.  She is not had a B12 shot in a while.  She notes that she never became very deficient but she was downtrending and therefore she was treated with B12.  She is not taking any oral B12.  She does have some balance issues occasionally.  2.  Hypothyroidism Patient is compliant with Synthroid.  No reports of tremor, heart palpitations or change in bowel habits  3.  Atrial fibrillation Patient reports compliance with Eliquis, Cardizem.  Does not report any runs of atrial fibrillation as of late.  She brings to me a form for completion for patient assistance of the Eliquis as hers will be ending at the end of the year.  4.  Hypertension with CKD 3A Patient is compliant with lisinopril split 20-12.5 in the morning and 20 mg in the evening.  This was at the behest of her cardiologist, Dr. Percival Spanish.  Her blood pressures run pretty good but they do tend to fluctuate between the low 100s and upper 100s.  She is not sure how much her A. fib is playing a part.  Denies any chest pain, shortness of breath or swelling  5.  Skin lesion Patient reports a skin lesion on the left anterior knee that was similar to the one that she had in the right posterior ankle that was treated with cryotherapy by her dermatologist.  She would like to have the lesion on the left knee treated as it has been there for about 3 months and continues to scale and then scab off   ROS: Per HPI  Allergies  Allergen Reactions   Benazepril Hcl Cough   Past Medical History:  Diagnosis Date   Calcification of bronchial airway 06/23/2018   01/2017 - See on CT imaging of the chest    Cataract    Cerebral vascular disease 06/19/2018   06/19/2018 - MRI with chronic microvascular ischemic  change   Diverticulosis    Hypertension    Osteoporosis    PAF (paroxysmal atrial fibrillation) ([place])    a. s/p prior DCCV; b. On flecainide & coumadin (CHA2DS2VASc = 4);  c. 03/2016 Echo: EF 55-60%, mod LVH, mod AI, mild to mod TR, PASP 70mmHg.   Pelvic fracture (HCC)    Pulmonary fibrosis ([place]) 2018   Rheumatoid arthritis ([place]) 2017    Current Outpatient Medications:    apixaban (ELIQUIS) 2.5 MG TABS tablet, Take 1 tablet (2.5 mg total) by mouth 2 (two) times daily., Disp: 180 tablet, Rfl: 3   azaTHIOprine (IMURAN) 50 MG tablet, Take 50 mg by mouth daily. , Disp: , Rfl:    calcium-vitamin D (OSCAL WITH D) 250-125 MG-UNIT per tablet, Take 1 tablet by mouth 2 (two) times daily., Disp: , Rfl:    diltiazem (CARDIZEM CD) 300 MG 24 hr capsule, Take 1 capsule (300 mg total) by mouth daily., Disp: 90 capsule, Rfl: 3   ESBRIET 267 MG TABS, Take 3 tablets (801 mg total) by mouth with breakfast, with lunch, and with evening meal., Disp: 270 tablet, Rfl: 5   furosemide (LASIX) 20 MG tablet, TAKE 1 TO 2 TABLETS BY  MOUTH DAILY AS NEEDED (Patient taking differently: Take 20 mg by mouth daily.), Disp: 180 tablet, Rfl: 3   gabapentin (NEURONTIN) 100 MG capsule, Take 100  mg by mouth at bedtime. , Disp: , Rfl:    levothyroxine (SYNTHROID) 75 MCG tablet, Take 1 tablet (75 mcg total) by mouth daily., Disp: 90 tablet, Rfl: 2   lisinopril (ZESTRIL) 20 MG tablet, TAKE 1 TABLET BY MOUTH  DAILY (Patient taking differently: Take 20 mg by mouth at bedtime.), Disp: 90 tablet, Rfl: 3   lisinopril-hydrochlorothiazide (ZESTORETIC) 20-12.5 MG tablet, TAKE 1 TABLET BY MOUTH  DAILY IN THE MORNING, Disp: 90 tablet, Rfl: 3   Melatonin 5 MG TABS, Take 5 mg by mouth at bedtime., Disp: , Rfl:    Multiple Vitamin (MULTIVITAMIN) capsule, Take 1 capsule by mouth daily., Disp: , Rfl:    potassium chloride SA (KLOR-CON) 20 MEQ tablet, TAKE 1 TABLET BY MOUTH  DAILY, Disp: 90 tablet, Rfl: 1   Probiotic Product (PROBIOTIC-10 ULTIMATE  PO), Take 1 tablet by mouth daily., Disp: , Rfl:   Current Facility-Administered Medications:    cyanocobalamin ((VITAMIN B-12)) injection 1,000 mcg, 1,000 mcg, Intramuscular, Q30 days, Rilie Glanz M, DO, 1,000 mcg at 03/23/21 1054 Social History   Socioeconomic History   Marital status: Married    Spouse name: Conservation officer, nature of children: 2   Years of education: 12   Highest education level: Not on file  Occupational History   Occupation: RETIRED  Tobacco Use   Smoking status: Never   Smokeless tobacco: Never  Vaping Use   Vaping Use: Never used  Substance and Sexual Activity   Alcohol use: No   Drug use: No   Sexual activity: Yes  Other Topics Concern   Not on file  Social History Narrative   Not on file   Social Determinants of Health   Financial Resource Strain: Low Risk    Difficulty of Paying Living Expenses: Not hard at all  Food Insecurity: No Food Insecurity   Worried About Charity fundraiser in the Last Year: Never true   [place] in the Last Year: Never true  Transportation Needs: No Transportation Needs   Lack of Transportation (Medical): No   Lack of Transportation (Non-Medical): No  Physical Activity: Sufficiently Active   Days of Exercise per Week: 2 days   Minutes of Exercise per Session: 90 min  Stress: No Stress Concern Present   Feeling of Stress : Not at all  Social Connections: Moderately Integrated   Frequency of Communication with Friends and Family: More than three times a week   Frequency of Social Gatherings with Friends and Family: Twice a week   Attends Religious Services: More than 4 times per year   Active Member of [employer] or Organizations: No   Attends Music therapist: Never   Marital Status: Married  Human resources officer Violence: Not At Risk   Fear of Current or Ex-Partner: No   Emotionally Abused: No   Physically Abused: No   Sexually Abused: No   Family History  Problem Relation Age of Onset   Stroke  Mother        cerebral hemorrhage   Heart disease Father        MI   Heart attack Father    Vision loss Father    Heart disease Brother    Heart attack Brother    Cancer Maternal Aunt        breast   Cancer Brother    Heart disease Brother    Cancer Daughter        breast   Heart disease Son  Colon cancer Neg Hx    Colon polyps Neg Hx    Rectal cancer Neg Hx    Stomach cancer Neg Hx     Objective: Office vital signs reviewed. BP 106/69   Pulse 79   Temp 98.3 F (36.8 C)   Resp 20   Ht 5' (1.524 m)   Wt 119 lb (54 kg)   SpO2 98%   BMI 23.24 kg/m   Physical Examination:  General: Awake, alert, well appearing elderly female, No acute distress HEENT: Normal; Notes of thalmus.  No goiter Cardio: Seemingly both regular rate and rhythm, S1S2 heard, no murmurs appreciated Pulm: clear to auscultation bilaterally, no wheezes, rhonchi or rales; normal work of breathing on room air Extremities: warm, she has hyperpigmentation of bilateral lower extremities right worse than left.  Left anterior knee with scaly, well-circumscribed lesion that is nonbleeding ~65mm in size MSK: Ambulating independently  Cryotherapy Procedure:  Risks and benefits of procedure were reviewed with the patient.  Written consent obtained and scanned into the chart.  Lesion of concern was identified and located on left anterior knee.  Liquid nitrogen was applied to area of concern and extending out 1 millimeters beyond the border of the lesion.  Treated area was allowed to come back to room temperature before treating it a second time.  Patient tolerated procedure well and there were no immediate complications.  Home care instructions were reviewed with the patient and a handout was provided.  Assessment/ Plan: 87 y.o. female   Acquired hypothyroidism - Plan: TSH, T4, Free  Stage 3a chronic kidney disease ([place]) - Plan: Renal Function Panel  Atrial fibrillation, chronic (HCC) - Plan: apixaban (ELIQUIS)  2.5 MG TABS tablet  Interstitial pulmonary disease (HCC)  Aortic atherosclerosis (HCC)  Actinic keratosis  Vitamin B12 deficiency - Plan: Vitamin B12  Asymptomatic from a thyroid standpoint.  Check TSH, T4  Renal function panel obtained.  Continue ACE inhibitor's as prescribed  Form completed for Eliquis.  Written prescription provided.  Continue to follow-up with pulmonology for IPF  Not yet due for fasting lipid and is not currently treated with any cholesterol-lowering medications  Actinic keratosis treated with cryotherapy.  She tolerated procedure without difficulty.  We discussed that if it does not fully resolve she is to see her dermatologist for further evaluation  Check B12 level.  No orders of the defined types were placed in this encounter.  No orders of the defined types were placed in this encounter.    [name], DO [place] [phone]

## 2021-09-24 NOTE — Patient Instructions (Signed)
Cryoablation, Care After ?This sheet gives you information about how to care for yourself after your procedure. Your health care provider may also give you more specific instructions. If you have problems or questions, contact your health care provider. ?What can I expect after the procedure? ?After the procedure, it is common to have: ?Soreness around the treatment area. ?Mild pain and swelling in the treatment area. ?Follow these instructions at home: ?Treatment area care ? ?If you have an incision, follow instructions from your health care provider about how to take care of it. Make sure you: ?Wash your hands with soap and water for at least 20 seconds before and after you change your bandage (dressing). If soap and water are not available, use hand sanitizer. ?Change your dressing as told by your health care provider. ?Leave stitches (sutures), skin glue, or adhesive strips in place. These skin closures may need to stay in place for 2 weeks or longer. If adhesive strip edges start to loosen and curl up, you may trim the loose edges. Do not remove adhesive strips completely unless your health care provider tells you to do that. ?Check your treatment area every day for signs of infection. Check for: ?More redness, swelling, or pain. ?Fluid or blood. ?Warmth. ?Pus or a bad smell. ?Keep the treated area clean, dry, and covered with a dressing until it has healed. Clean the area with soap and water or as told by your health care provider. If your bandage gets wet, change it right away. ?Do not take baths, swim, or use a hot tub until your health care provider approves. Ask your health care provider if you may take showers. You may only be allowed to take sponge baths. ?Activity ? ?Follow instructions from your health care provider about any activity limitations, including lifting heavy objects. ?If you were given a sedative during the procedure, it can affect you for several hours. Do not drive or operate machinery  until your health care provider says that it is safe. ?General instructions ?Take over-the-counter and prescription medicines only as told by your health care provider. ?Do not use any products that contain nicotine or tobacco, such as cigarettes, e-cigarettes, and chewing tobacco. These can delay incision healing. If you need help quitting, ask your health care provider. ?Keep all follow-up visits as told by your health care provider. This is important. ?Contact a health care provider if: ?You have increased pain. ?You have a fever. ?You have nausea or vomiting. ?You have any of these signs of infection: ?More redness, swelling, or pain around your treatment area. ?Fluid or blood coming from your treatment area. ?Warmth coming from your treatment area. ?Pus or a bad smell coming from your treatment area. ?You do not have a bowel movement for 2 days. ?Get help right away if you have: ?Severe pain. ?Trouble swallowing or breathing. ?Severe weakness or dizziness. ?Chest pain or shortness of breath. ?These symptoms may represent a serious problem that is an emergency. Do not wait to see if the symptoms will go away. Get medical help right away. Call your local emergency services (911 in the [place] Do not drive yourself to the hospital. ?Summary ?After the procedure, it is common for the treatment area to be sore, mildly painful, and swollen. ?If you have a dressing, change it as told by your health care provider. ?Follow instructions from your health care provider about any activity limitations. ?Contact a health care provider if you have increased pain or a fever. ?This information is   not intended to replace advice given to you by your health care provider. Make sure you discuss any questions you have with your health care provider. ?Document Revised: 08/12/2019 Document Reviewed: 08/12/2019 ?Elsevier Patient Education ? 2022 Elsevier Inc. ? ?

## 2021-09-25 LAB — VITAMIN B12: Vitamin B-12: 1196 pg/mL (ref 232–1245)

## 2021-09-25 LAB — TSH: TSH: 1.42 u[IU]/mL (ref 0.450–4.500)

## 2021-09-25 LAB — RENAL FUNCTION PANEL
Albumin: 3.8 g/dL (ref 3.6–4.6)
BUN/Creatinine Ratio: 21 (ref 12–28)
BUN: 24 mg/dL (ref 8–27)
CO2: 28 mmol/L (ref 20–29)
Calcium: 9.5 mg/dL (ref 8.7–10.3)
Chloride: 102 mmol/L (ref 96–106)
Creatinine, Ser: 1.13 mg/dL — ABNORMAL HIGH (ref 0.57–1.00)
Glucose: 64 mg/dL — ABNORMAL LOW (ref 70–99)
Phosphorus: 3.4 mg/dL (ref 3.0–4.3)
Potassium: 3.8 mmol/L (ref 3.5–5.2)
Sodium: 143 mmol/L (ref 134–144)
eGFR: 47 mL/min/{1.73_m2} — ABNORMAL LOW (ref >59)

## 2021-09-25 LAB — T4, FREE: Free T4: 1.59 ng/dL (ref 0.82–1.77)

## 2021-09-26 NOTE — Telephone Encounter (Signed)
Per [employer] portal, pt has been approved for ESBRIET patient assistance beginning 09/21/2021. Pt will remain active until therapy is discontinued, there are any significant changes to pt's health insurance or financial status, or pt no longer meets program eligibility requirements.   Called pt and verified that they were aware of approval and that shipment of medication had been arranged. Pt has no further questions or concerns at this time. Approval letter printed from portal and sent to scan center along with application. 

## 2021-10-16 DIAGNOSIS — H2513 Age-related nuclear cataract, bilateral: Secondary | ICD-10-CM | POA: Diagnosis not present

## 2021-10-16 DIAGNOSIS — H40033 Anatomical narrow angle, bilateral: Secondary | ICD-10-CM | POA: Diagnosis not present

## 2021-10-18 ENCOUNTER — Other Ambulatory Visit: Payer: Self-pay | Admitting: Family Medicine

## 2021-10-18 ENCOUNTER — Other Ambulatory Visit: Payer: Self-pay | Admitting: Cardiology

## 2021-11-01 ENCOUNTER — Telehealth: Payer: Self-pay

## 2021-11-02 ENCOUNTER — Ambulatory Visit (INDEPENDENT_AMBULATORY_CARE_PROVIDER_SITE_OTHER): Payer: Medicare Other

## 2021-11-02 VITALS — BP 133/80 | HR 88 | Temp 98.6°F | Ht 60.0 in | Wt 118.0 lb

## 2021-11-02 DIAGNOSIS — Z0000 Encounter for general adult medical examination without abnormal findings: Secondary | ICD-10-CM

## 2021-11-02 NOTE — Patient Instructions (Addendum)
Susan Fitzgerald , Thank you for taking time to come for your Medicare Wellness Visit. I appreciate your ongoing commitment to your health goals. Please review the following plan we discussed and let me know if I can assist you in the future.   Screening recommendations/referrals: Colonoscopy: Done 09/08/2019 - no repeat Mammogram: Done 11/04/2019 - repeat not required, but still recommended every 1-2 years Bone Density: Done 01/30/2021 - Repeat every 2 years  Recommended yearly ophthalmology/optometry visit for glaucoma screening and checkup Recommended yearly dental visit for hygiene and checkup  Vaccinations: Influenza vaccine: Done 08/13/2021 - Repeat annually  Pneumococcal vaccine: Done 09/05/2014 & 10/28/2018 Tdap vaccine: Done 09/02/2011 - Repeat in 10 years *Due Shingles vaccine: Done 11/13/2018 & 03/15/2019   Covid-19: Done 12/24/2019, 01/19/2020, 09/08/2020, & 08/20/2021  Advanced directives: Please bring a copy of your health care power of attorney and living will to the office to be added to your chart at your convenience.  Conditions/risks identified: Aim for 30 minutes of exercise or brisk walking each day, drink 6-8 glasses of water and eat lots of fruits and vegetables.   Next appointment: Follow up in one year for your annual wellness visit   Preventive Care 65 Years and Older, Female Preventive care refers to lifestyle choices and visits with your health care provider that can promote health and wellness. What does preventive care include? A yearly physical exam. This is also called an annual well check. Dental exams once or twice a year. Routine eye exams. Ask your health care provider how often you should have your eyes checked. Personal lifestyle choices, including: Daily care of your teeth and gums. Regular physical activity. Eating a healthy diet. Avoiding tobacco and drug use. Limiting alcohol use. Practicing safe sex. Taking low-dose aspirin every day. Taking vitamin  and mineral supplements as recommended by your health care provider. What happens during an annual well check? The services and screenings done by your health care provider during your annual well check will depend on your age, overall health, lifestyle risk factors, and family history of disease. Counseling  Your health care provider may ask you questions about your: Alcohol use. Tobacco use. Drug use. Emotional well-being. Home and relationship well-being. Sexual activity. Eating habits. History of falls. Memory and ability to understand (cognition). Work and work Statistician. Reproductive health. Screening  You may have the following tests or measurements: Height, weight, and BMI. Blood pressure. Lipid and cholesterol levels. These may be checked every 5 years, or more frequently if you are over 39 years old. Skin check. Lung cancer screening. You may have this screening every year starting at age 45 if you have a 30-pack-year history of smoking and currently smoke or have quit within the past 15 years. Fecal occult blood test (FOBT) of the stool. You may have this test every year starting at age 20. Flexible sigmoidoscopy or colonoscopy. You may have a sigmoidoscopy every 5 years or a colonoscopy every 10 years starting at age 36. Hepatitis C blood test. Hepatitis B blood test. Sexually transmitted disease (STD) testing. Diabetes screening. This is done by checking your blood sugar (glucose) after you have not eaten for a while (fasting). You may have this done every 1-3 years. Bone density scan. This is done to screen for osteoporosis. You may have this done starting at age 23. Mammogram. This may be done every 1-2 years. Talk to your health care provider about how often you should have regular mammograms. Talk with your health care provider about  your test results, treatment options, and if necessary, the need for more tests. Vaccines  Your health care provider may recommend  certain vaccines, such as: Influenza vaccine. This is recommended every year. Tetanus, diphtheria, and acellular pertussis (Tdap, Td) vaccine. You may need a Td booster every 10 years. Zoster vaccine. You may need this after age 46. Pneumococcal 13-valent conjugate (PCV13) vaccine. One dose is recommended after age 13. Pneumococcal polysaccharide (PPSV23) vaccine. One dose is recommended after age 29. Talk to your health care provider about which screenings and vaccines you need and how often you need them. This information is not intended to replace advice given to you by your health care provider. Make sure you discuss any questions you have with your health care provider. Document Released: 12/01/2015 Document Revised: 07/24/2016 Document Reviewed: 09/05/2015 Elsevier Interactive Patient Education  2017 [place] Prevention in the Home Falls can cause injuries. They can happen to people of all ages. There are many things you can do to make your home safe and to help prevent falls. What can I do on the outside of my home? Regularly fix the edges of walkways and driveways and fix any cracks. Remove anything that might make you trip as you walk through a door, such as a raised step or threshold. Trim any bushes or trees on the path to your home. Use bright outdoor lighting. Clear any walking paths of anything that might make someone trip, such as rocks or tools. Regularly check to see if handrails are loose or broken. Make sure that both sides of any steps have handrails. Any raised decks and porches should have guardrails on the edges. Have any leaves, snow, or ice cleared regularly. Use sand or salt on walking paths during winter. Clean up any spills in your garage right away. This includes oil or grease spills. What can I do in the bathroom? Use night lights. Install grab bars by the toilet and in the tub and shower. Do not use towel bars as grab bars. Use non-skid mats or  decals in the tub or shower. If you need to sit down in the shower, use a plastic, non-slip stool. Keep the floor dry. Clean up any water that spills on the floor as soon as it happens. Remove soap buildup in the tub or shower regularly. Attach bath mats securely with double-sided non-slip rug tape. Do not have throw rugs and other things on the floor that can make you trip. What can I do in the bedroom? Use night lights. Make sure that you have a light by your bed that is easy to reach. Do not use any sheets or blankets that are too big for your bed. They should not hang down onto the floor. Have a firm chair that has side arms. You can use this for support while you get dressed. Do not have throw rugs and other things on the floor that can make you trip. What can I do in the kitchen? Clean up any spills right away. Avoid walking on wet floors. Keep items that you use a lot in easy-to-reach places. If you need to reach something above you, use a strong step stool that has a grab bar. Keep electrical cords out of the way. Do not use floor polish or wax that makes floors slippery. If you must use wax, use non-skid floor wax. Do not have throw rugs and other things on the floor that can make you trip. What can I do with  my stairs? Do not leave any items on the stairs. Make sure that there are handrails on both sides of the stairs and use them. Fix handrails that are broken or loose. Make sure that handrails are as long as the stairways. Check any carpeting to make sure that it is firmly attached to the stairs. Fix any carpet that is loose or worn. Avoid having throw rugs at the top or bottom of the stairs. If you do have throw rugs, attach them to the floor with carpet tape. Make sure that you have a light switch at the top of the stairs and the bottom of the stairs. If you do not have them, ask someone to add them for you. What else can I do to help prevent falls? Wear shoes that: Do not  have high heels. Have rubber bottoms. Are comfortable and fit you well. Are closed at the toe. Do not wear sandals. If you use a stepladder: Make sure that it is fully opened. Do not climb a closed stepladder. Make sure that both sides of the stepladder are locked into place. Ask someone to hold it for you, if possible. Clearly mark and make sure that you can see: Any grab bars or handrails. First and last steps. Where the edge of each step is. Use tools that help you move around (mobility aids) if they are needed. These include: Canes. Walkers. Scooters. Crutches. Turn on the lights when you go into a dark area. Replace any light bulbs as soon as they burn out. Set up your furniture so you have a clear path. Avoid moving your furniture around. If any of your floors are uneven, fix them. If there are any pets around you, be aware of where they are. Review your medicines with your doctor. Some medicines can make you feel dizzy. This can increase your chance of falling. Ask your doctor what other things that you can do to help prevent falls. This information is not intended to replace advice given to you by your health care provider. Make sure you discuss any questions you have with your health care provider. Document Released: 08/31/2009 Document Revised: 04/11/2016 Document Reviewed: 12/09/2014 Elsevier Interactive Patient Education  2017 [employer].

## 2021-11-02 NOTE — Progress Notes (Signed)
Subjective:   Susan Fitzgerald is a 87 y.o. female who presents for Medicare Annual (Subsequent) preventive examination.  Virtual Visit via Telephone Note  I connected with  Susan Fitzgerald on 11/02/21 at 11:15 AM EST by telephone and verified that I am speaking with the correct person using two identifiers.  Location: Patient: Home Provider: [facility] Persons participating in the virtual visit: patient/Nurse Health Advisor   I discussed the limitations, risks, security and privacy concerns of performing an evaluation and management service by telephone and the availability of in person appointments. The patient expressed understanding and agreed to proceed.  Interactive audio and video telecommunications were attempted between this nurse and patient, however failed, due to patient having technical difficulties OR patient did not have access to video capability.  We continued and completed visit with audio only.  Some vital signs may be absent or patient reported.   Susan Skowron E Whitney Hillegass, LPN   Review of Systems     Cardiac Risk Factors include: advanced age (>55men, >19 women);hypertension;Other (see comment), Risk factor comments: A.Fib, cerebral vascular disease, pulmonary fibrosis     Objective:    Today's Vitals   11/02/21 1121  BP: 133/80  Pulse: 88  Temp: 98.6 F (37 C)  SpO2: 96%  Weight: 118 lb (53.5 kg)  Height: 5' (1.524 m)   Body mass index is 23.05 kg/m.  Advanced Directives 11/02/2021 08/15/2021 05/02/2021 04/11/2021 11/01/2020 12/06/2019 11/01/2019  Does Patient Have a Medical Advance Directive? No Yes Yes Yes No;Yes Yes Yes  Type of Advance Directive - [place];Living will Living will;Healthcare Power of [place];Living will [place];Living will Living will;Healthcare Power of [place];Out of facility DNR (pink MOST or yellow form)  Does patient want to make changes to medical  advance directive? - No - Patient declined No - Patient declined No - Patient declined No - Patient declined - No - Patient declined  Copy of [place] in Chart? - No - copy requested No - copy requested No - copy requested - - No - copy requested  Would patient like information on creating a medical advance directive? No - Patient declined No - Patient declined - - No - Patient declined - -  Pre-existing out of facility DNR order (yellow form or pink MOST form) - - - - - - -    Current Medications (verified) Outpatient Encounter Medications as of 11/02/2021  Medication Sig   apixaban (ELIQUIS) 2.5 MG TABS tablet Take 1 tablet (2.5 mg total) by mouth 2 (two) times daily.   azaTHIOprine (IMURAN) 50 MG tablet Take 50 mg by mouth daily.    calcium-vitamin D (OSCAL WITH D) 250-125 MG-UNIT per tablet Take 1 tablet by mouth 2 (two) times daily.   diltiazem (CARDIZEM CD) 300 MG 24 hr capsule TAKE 1 CAPSULE BY MOUTH  DAILY   ESBRIET 267 MG TABS Take 3 tablets (801 mg total) by mouth with breakfast, with lunch, and with evening meal.   furosemide (LASIX) 20 MG tablet TAKE 1 TO 2 TABLETS BY  MOUTH DAILY AS NEEDED (Patient taking differently: Take 20 mg by mouth daily.)   gabapentin (NEURONTIN) 100 MG capsule Take 100 mg by mouth at bedtime.    lisinopril (ZESTRIL) 20 MG tablet TAKE 1 TABLET BY MOUTH  DAILY   lisinopril-hydrochlorothiazide (ZESTORETIC) 20-12.5 MG tablet TAKE 1 TABLET BY MOUTH  DAILY IN THE MORNING   Melatonin 5 MG  TABS Take 5 mg by mouth at bedtime.   Multiple Vitamin (MULTIVITAMIN) capsule Take 1 capsule by mouth daily.   potassium chloride SA (KLOR-CON M) 20 MEQ tablet TAKE 1 TABLET BY MOUTH  DAILY   Probiotic Product (PROBIOTIC-10 ULTIMATE PO) Take 1 tablet by mouth daily.   levothyroxine (SYNTHROID) 75 MCG tablet Take 1 tablet (75 mcg total) by mouth daily.   No facility-administered encounter medications on file as of 11/02/2021.    Allergies  (verified) Benazepril hcl   History: Past Medical History:  Diagnosis Date   Calcification of bronchial airway 06/23/2018   01/2017 - See on CT imaging of the chest    Cataract    Cerebral vascular disease 06/19/2018   06/19/2018 - MRI with chronic microvascular ischemic change   Diverticulosis    Hypertension    Osteoporosis    PAF (paroxysmal atrial fibrillation) ([place])    a. s/p prior DCCV; b. On flecainide & coumadin (CHA2DS2VASc = 4);  c. 03/2016 Echo: EF 55-60%, mod LVH, mod AI, mild to mod TR, PASP 68mmHg.   Pelvic fracture ([facility])    Pulmonary fibrosis ([place]) 2018   Rheumatoid arthritis ([place]) 2017   Past Surgical History:  Procedure Laterality Date   BIOPSY BREAST     BIOPSY BREAST     right & benign   BREAST SURGERY     CARDIOVERSION N/A 10/30/2016   Procedure: CARDIOVERSION;  Surgeon: Larey Dresser, MD;  Location: [facility];  Service: Cardiovascular;  Laterality: N/A;   CARDIOVERSION N/A 08/20/2017   Procedure: CARDIOVERSION;  Surgeon: Jerline Pain, MD;  Location: [facility];  Service: Cardiovascular;  Laterality: N/A;   CARDIOVERSION N/A 02/02/2020   Procedure: CARDIOVERSION;  Surgeon: Geralynn Rile, MD;  Location: [facility];  Service: Cardiovascular;  Laterality: N/A;   CATARACT EXTRACTION     COLONOSCOPY     EYE SURGERY     cataracts   TEE WITHOUT CARDIOVERSION N/A 10/30/2016   Procedure: TRANSESOPHAGEAL ECHOCARDIOGRAM (TEE);  Surgeon: Larey Dresser, MD;  Location: [facility];  Service: Cardiovascular;  Laterality: N/A;   TOTAL HIP ARTHROPLASTY Right 01/27/2017   Procedure: TOTAL HIP ARTHROPLASTY ANTERIOR APPROACH;  Surgeon: Rod Can, MD;  Location: [facility];  Service: Orthopedics;  Laterality: Right;   Family History  Problem Relation Age of Onset   Stroke Mother        cerebral hemorrhage   Heart disease Father        MI   Heart attack Father    Vision loss Father    Heart disease Brother    Heart attack Brother    Cancer Maternal Aunt         breast   Cancer Brother    Heart disease Brother    Cancer Daughter        breast   Heart disease Son    Colon cancer Neg Hx    Colon polyps Neg Hx    Rectal cancer Neg Hx    Stomach cancer Neg Hx    Social History   Socioeconomic History   Marital status: Married    Spouse name: Engineer, technical sales    Number of children: 2   Years of education: 12   Highest education level: Not on file  Occupational History   Occupation: RETIRED  Tobacco Use   Smoking status: Never   Smokeless tobacco: Never  Vaping Use   Vaping Use: Never used  Substance and Sexual Activity   Alcohol use: No   Drug  use: No   Sexual activity: Yes  Other Topics Concern   Not on file  Social History Narrative   Lives with her husband.   Enjoys playing golf.   Still very active and independent   Social Determinants of Health   Financial Resource Strain: Low Risk    Difficulty of Paying Living Expenses: Not hard at all  Food Insecurity: No Food Insecurity   Worried About Charity fundraiser in the Last Year: Never true   [place] in the Last Year: Never true  Transportation Needs: No Transportation Needs   Lack of Transportation (Medical): No   Lack of Transportation (Non-Medical): No  Physical Activity: Sufficiently Active   Days of Exercise per Week: 2 days   Minutes of Exercise per Session: 90 min  Stress: No Stress Concern Present   Feeling of Stress : Not at all  Social Connections: Moderately Integrated   Frequency of Communication with Friends and Family: More than three times a week   Frequency of Social Gatherings with Friends and Family: Twice a week   Attends Religious Services: More than 4 times per year   Active Member of [employer] or Organizations: No   Attends Music therapist: Never   Marital Status: Married    Tobacco Counseling Counseling given: Not Answered   Clinical Intake:  Pre-visit preparation completed: Yes  Pain : No/denies pain     BMI - recorded:  23.05 Nutritional Status: BMI of 19-24  Normal Nutritional Risks: None Diabetes: No  How often do you need to have someone help you when you read instructions, pamphlets, or other written materials from your doctor or pharmacy?: 1 - Never  Diabetic? no  Interpreter Needed?: No  Information entered by :: Juston Goheen, LPN   Activities of Daily Living In your present state of health, do you have any difficulty performing the following activities: 11/02/2021  Hearing? N  Vision? N  Difficulty concentrating or making decisions? N  Walking or climbing stairs? N  Dressing or bathing? N  Doing errands, shopping? N  Preparing Food and eating ? N  Using the Toilet? N  In the past six months, have you accidently leaked urine? N  Do you have problems with loss of bowel control? N  Managing your Medications? N  Managing your Finances? N  Housekeeping or managing your Housekeeping? N  Some recent data might be hidden    Patient Care Team: [name], DO as PCP - General (Family Medicine) Minus Breeding, MD as PCP - Cardiology (Cardiology) Druscilla Brownie, MD as Consulting Physician (Dermatology) Hennie Duos, MD as Consulting Physician (Rheumatology) Marlaine Hind, MD as Consulting Physician (Physical Medicine and Rehabilitation) Lavera Guise, [facility] as Pharmacist (Family Medicine)  Indicate any recent Medical Services you may have received from other than [facility] providers in the past year (date may be approximate).     Assessment:   This is a routine wellness examination for Tori.  Hearing/Vision screen Hearing Screening - Comments:: Denies hearing difficulties  Vision Screening - Comments:: Denies vision difficulties - up to date with annual eye exams with [facility] in [place]  Dietary issues and exercise activities discussed: Current Exercise Habits: Home exercise routine, Type of exercise: walking;Other - see comments (plays golf), Time (Minutes): 60,  Frequency (Times/Week): 2, Weekly Exercise (Minutes/Week): 120, Intensity: Mild, Exercise limited by: cardiac condition(s);respiratory conditions(s);orthopedic condition(s)   Goals Addressed  This Visit's Progress     Have 3 meals a day   On track     Try to have 3 balanced meals per day including a lean protein source, 2 sources of carbohydrates (whole grains or fruits are good options), and a non-starchy vegetable.  Ensure or Boost are good for supplements as needed.       LIFESTYLE - DECREASE FALLS RISK (pt-stated)   On track     Keep active and safe, while being very careful       Depression Screen PHQ 2/9 Scores 11/02/2021 09/24/2021 05/29/2021 05/22/2021 03/23/2021 11/01/2020 09/18/2020  PHQ - 2 Score 0 0 0 0 0 0 0  PHQ- 9 Score 4 0 0 - - - 0  Exception Documentation - - - - - - -    Fall Risk Fall Risk  11/02/2021 09/24/2021 05/22/2021 03/23/2021 11/01/2020  Falls in the past year? 0 0 0 0 0  Number falls in past yr: 0 - - - -  Injury with Fall? 0 - - - -  Comment - - - - -  Risk for fall due to : History of fall(s);Orthopedic patient - - - -  Risk for fall due to: Comment - - - - -  Follow up Falls prevention discussed - - - Falls evaluation completed    FALL RISK PREVENTION PERTAINING TO THE HOME:  Any stairs in or around the home? Yes  If so, are there any without handrails? Yes  - 3 steps coming into house from garage - thinking of putting hand rails up soon Home free of loose throw rugs in walkways, pet beds, electrical cords, etc? Yes  Adequate lighting in your home to reduce risk of falls? Yes   ASSISTIVE DEVICES UTILIZED TO PREVENT FALLS:  Life alert? No  Use of a cane, walker or w/c? No  Grab bars in the bathroom? No  Shower chair or bench in shower? No  Elevated toilet seat or a handicapped toilet? No   TIMED UP AND GO:  Was the test performed? No .   Cognitive Function: MMSE - Mini Mental State Exam 10/28/2018 05/07/2016 04/10/2015   Orientation to time 5 4 5   Orientation to Place 5 5 5   Registration 3 3 3   Attention/ Calculation 5 4 5   Recall 3 3 3   Language- name 2 objects 2 2 2   Language- repeat 1 1 1   Language- follow 3 step command 3 1 3   Language- read & follow direction 1 1 1   Write a sentence 1 1 1   Copy design 1 - 1  Total score 30 - 30     6CIT Screen 11/02/2021 11/01/2020 11/01/2019  What Year? 0 points 0 points 0 points  What month? 0 points 0 points 0 points  What time? 0 points 0 points 0 points  Count back from 20 0 points 0 points 0 points  Months in reverse 0 points 0 points 0 points  Repeat phrase 0 points 0 points 2 points  Total Score 0 0 2    Immunizations Immunization History  Administered Date(s) Administered   Fluad Quad(high Dose 65+) 07/20/2019, 08/23/2020, 08/13/2021   Influenza, High Dose Seasonal PF 09/20/2016, 09/19/2017, 08/25/2018   Influenza,inj,Quad PF,6+ Mos 08/23/2013, 09/05/2014, 08/28/2015   Moderna SARS-COV2 Booster Vaccination 09/08/2020   PFIZER(Purple Top)SARS-COV-2 Vaccination 12/24/2019, 01/19/2020   Pfizer Covid-19 Vaccine Bivalent Booster 5y-11y 08/20/2021   Pneumococcal Conjugate-13 09/05/2014   Pneumococcal Polysaccharide-23 10/28/2018   Td 09/02/2011   Tdap  09/02/2011   Zoster Recombinat (Shingrix) 11/13/2018, 03/15/2019    TDAP status: Due, Education has been provided regarding the importance of this vaccine. Advised may receive this vaccine at local pharmacy or Health Dept. Aware to provide a copy of the vaccination record if obtained from local pharmacy or Health Dept. Verbalized acceptance and understanding.  Flu Vaccine status: Up to date  Pneumococcal vaccine status: Up to date  Covid-19 vaccine status: Completed vaccines  Qualifies for Shingles Vaccine? Yes   Zostavax completed Yes   Shingrix Completed?: Yes  Screening Tests Health Maintenance  Topic Date Due   COVID-19 Vaccine (4 - Booster for Pfizer series) 10/15/2021   MAMMOGRAM   03/23/2022 (Originally 11/01/2020)   TETANUS/TDAP  09/24/2022 (Originally 09/01/2021)   DEXA SCAN  01/31/2023   Pneumonia Vaccine 24+ Years old  Completed   INFLUENZA VACCINE  Completed   Zoster Vaccines- Shingrix  Completed   HPV VACCINES  Aged Out    Health Maintenance  Health Maintenance Due  Topic Date Due   COVID-19 Vaccine (4 - Booster for [place] series) 10/15/2021    Colorectal cancer screening: No longer required.   Mammogram status: No longer required due to age.  Bone Density status: Completed 01/30/2021. Results reflect: Bone density results: OSTEOPOROSIS. Repeat every 2 years.  Lung Cancer Screening: (Low Dose CT Chest recommended if Age 29-80 years, 30 pack-year currently smoking OR have quit w/in 15years.) does not qualify.   Additional Screening:  Hepatitis C Screening: does not qualify  Vision Screening: Recommended annual ophthalmology exams for early detection of glaucoma and other disorders of the eye. Is the patient up to date with their annual eye exam?  Yes  Who is the provider or what is the name of the office in which the patient attends annual eye exams? [facility] If pt is not established with a provider, would they like to be referred to a provider to establish care? No .   Dental Screening: Recommended annual dental exams for proper oral hygiene  Community Resource Referral / Chronic Care Management: CRR required this visit?  No   CCM required this visit?  No      Plan:     I have personally reviewed and noted the following in the patients chart:   Medical and social history Use of alcohol, tobacco or illicit drugs  Current medications and supplements including opioid prescriptions.  Functional ability and status Nutritional status Physical activity Advanced directives List of other physicians Hospitalizations, surgeries, and ER visits in previous 12 months Vitals Screenings to include cognitive, depression, and  falls Referrals and appointments  In addition, I have reviewed and discussed with patient certain preventive protocols, quality metrics, and best practice recommendations. A written personalized care plan for preventive services as well as general preventive health recommendations were provided to patient.     Sandrea Hammond, LPN   [id number]/12/4095   Nurse Notes: For several months, she has intermittent pain in and around her left elbow - still has good ROM, no redness, swelling, NKI; advised to make appt if worse or no better

## 2021-11-14 DIAGNOSIS — Z79899 Other long term (current) drug therapy: Secondary | ICD-10-CM | POA: Diagnosis not present

## 2021-11-14 DIAGNOSIS — M150 Primary generalized (osteo)arthritis: Secondary | ICD-10-CM | POA: Diagnosis not present

## 2021-11-14 DIAGNOSIS — R7989 Other specified abnormal findings of blood chemistry: Secondary | ICD-10-CM | POA: Diagnosis not present

## 2021-11-14 DIAGNOSIS — J849 Interstitial pulmonary disease, unspecified: Secondary | ICD-10-CM | POA: Diagnosis not present

## 2021-11-14 DIAGNOSIS — M5030 Other cervical disc degeneration, unspecified cervical region: Secondary | ICD-10-CM | POA: Diagnosis not present

## 2021-11-14 DIAGNOSIS — Z6822 Body mass index (BMI) 22.0-22.9, adult: Secondary | ICD-10-CM | POA: Diagnosis not present

## 2021-11-14 DIAGNOSIS — M0579 Rheumatoid arthritis with rheumatoid factor of multiple sites without organ or systems involvement: Secondary | ICD-10-CM | POA: Diagnosis not present

## 2021-11-14 DIAGNOSIS — M5136 Other intervertebral disc degeneration, lumbar region: Secondary | ICD-10-CM | POA: Diagnosis not present

## 2021-11-22 ENCOUNTER — Telehealth: Payer: Self-pay | Admitting: Pharmacy Technician

## 2021-11-22 NOTE — Telephone Encounter (Signed)
Patient has been approved for $9,000 copay grant through [employer]. Approved through 10/23/21 through 10/22/22.  Processing information:  CARD NO. [id number]   BIN [id number]   PCN [id number]   PC GROUP [id number]    HELP DESK [id number]

## 2021-12-03 ENCOUNTER — Telehealth: Payer: Self-pay | Admitting: Family Medicine

## 2021-12-03 NOTE — Telephone Encounter (Signed)
Patient advised that she should continue to fill Esbriet through [facility].  Patient advised she will no longer receive through grant since they open up infrequently and we cannot guarantee annual fund re-enrollment. She verbalized understanding and will call [employer] to schedule shipment of Esbriet  Knox Saliva, PharmD, MPH, BCPS Clinical Pharmacist (Rheumatology and Pulmonology)

## 2021-12-04 ENCOUNTER — Telehealth: Payer: Medicare Other

## 2021-12-25 DIAGNOSIS — D225 Melanocytic nevi of trunk: Secondary | ICD-10-CM | POA: Diagnosis not present

## 2021-12-25 DIAGNOSIS — L408 Other psoriasis: Secondary | ICD-10-CM | POA: Diagnosis not present

## 2021-12-25 DIAGNOSIS — L821 Other seborrheic keratosis: Secondary | ICD-10-CM | POA: Diagnosis not present

## 2021-12-25 DIAGNOSIS — L814 Other melanin hyperpigmentation: Secondary | ICD-10-CM | POA: Diagnosis not present

## 2021-12-25 DIAGNOSIS — L570 Actinic keratosis: Secondary | ICD-10-CM | POA: Diagnosis not present

## 2021-12-25 DIAGNOSIS — L820 Inflamed seborrheic keratosis: Secondary | ICD-10-CM | POA: Diagnosis not present

## 2021-12-25 DIAGNOSIS — L218 Other seborrheic dermatitis: Secondary | ICD-10-CM | POA: Diagnosis not present

## 2021-12-25 DIAGNOSIS — L538 Other specified erythematous conditions: Secondary | ICD-10-CM | POA: Diagnosis not present

## 2022-01-04 ENCOUNTER — Encounter: Payer: Self-pay | Admitting: Family Medicine

## 2022-01-04 ENCOUNTER — Ambulatory Visit (INDEPENDENT_AMBULATORY_CARE_PROVIDER_SITE_OTHER): Payer: Medicare Other | Admitting: Family Medicine

## 2022-01-04 VITALS — BP 144/78 | HR 97 | Temp 97.5°F | Ht 60.0 in | Wt 120.0 lb

## 2022-01-04 DIAGNOSIS — K4030 Unilateral inguinal hernia, with obstruction, without gangrene, not specified as recurrent: Secondary | ICD-10-CM

## 2022-01-04 DIAGNOSIS — L570 Actinic keratosis: Secondary | ICD-10-CM | POA: Diagnosis not present

## 2022-01-04 DIAGNOSIS — H6122 Impacted cerumen, left ear: Secondary | ICD-10-CM

## 2022-01-04 NOTE — Patient Instructions (Signed)
Inguinal Hernia, Adult An inguinal hernia develops when fat or the intestines push through a weak spot in a muscle where the leg meets the lower abdomen (groin). This creates a bulge. This kind of hernia could also be: In the scrotum, if you are female. In folds of skin around the vagina, if you are female. There are three types of inguinal hernias: Hernias that can be pushed back into the abdomen (are reducible). This type rarely causes pain. Hernias that are not reducible (are incarcerated). Hernias that are not reducible and lose their blood supply (are strangulated). This type of hernia requires emergency surgery. What are the causes? This condition is caused by having a weak spot in the muscles or tissues in your groin. This develops over time. The hernia may poke through the weak spot when you suddenly strain your lower abdominal muscles, such as when you: Lift a heavy object. Strain to have a bowel movement. Constipation can lead to straining. Cough. What increases the risk? This condition is more likely to develop in: Males. Pregnant females. People who: Are overweight. Work in jobs that require long periods of standing or heavy lifting. Have had an inguinal hernia before. Smoke or have lung disease. These factors can lead to long-term (chronic) coughing. What are the signs or symptoms? Symptoms may depend on the size of the hernia. Often, a small inguinal hernia has no symptoms. Symptoms of a larger hernia may include: A bulge in the groin area. This is easier to see when standing. It might not be visible when lying down. Pain or burning in the groin. This may get worse when lifting, straining, or coughing. A dull ache or a feeling of pressure in the groin. An unusual bulge in the scrotum, in males. Symptoms of a strangulated inguinal hernia may include: A bulge in your groin that is very painful and tender to the touch. A bulge that turns red or purple. Fever, nausea, and  vomiting. Inability to have a bowel movement or to pass gas. How is this diagnosed? This condition is diagnosed based on your symptoms, your medical history, and a physical exam. Your health care provider may feel your groin area and ask you to cough. How is this treated? Treatment depends on the size of your hernia and whether you have symptoms. If you do not have symptoms, your health care provider may have you watch your hernia carefully and have you come in for follow-up visits. If your hernia is large or if you have symptoms, you may need surgery to repair the hernia. Follow these instructions at home: Lifestyle Avoid lifting heavy objects. Avoid standing for long periods of time. Do not use any products that contain nicotine or tobacco. These products include cigarettes, chewing tobacco, and vaping devices, such as e-cigarettes. If you need help quitting, ask your health care provider. Maintain a healthy weight. Preventing constipation You may need to take these actions to prevent or treat constipation: Drink enough fluid to keep your urine pale yellow. Take over-the-counter or prescription medicines. Eat foods that are high in fiber, such as beans, whole grains, and fresh fruits and vegetables. Limit foods that are high in fat and processed sugars, such as fried or sweet foods. General instructions You may try to push the hernia back in place by very gently pressing on it while lying down. Do not try to force the bulge back in if it will not push in easily. Watch your hernia for any changes in shape, size, or  color. Get help right away if you notice any changes. Take over-the-counter and prescription medicines only as told by your health care provider. Keep all follow-up visits. This is important. Contact a health care provider if: You have a fever or chills. You develop new symptoms. Your symptoms get worse. Get help right away if: You have pain in your groin that suddenly gets  worse. You have a bulge in your groin that: Suddenly gets bigger and does not get smaller. Becomes red or purple or painful to the touch. You are a man and you have a sudden pain in your scrotum, or the size of your scrotum suddenly changes. You cannot push the hernia back in place by very gently pressing on it when you are lying down. You have nausea or vomiting that does not go away. You have a fast heartbeat. You cannot have a bowel movement or pass gas. These symptoms may represent a serious problem that is an emergency. Do not wait to see if the symptoms will go away. Get medical help right away. Call your local emergency services (911 in the [place] Summary An inguinal hernia develops when fat or the intestines push through a weak spot in a muscle where your leg meets your lower abdomen (groin). This condition is caused by having a weak spot in muscles or tissues in your groin. Symptoms may depend on the size of the hernia, and they may include pain or swelling in your groin. A small inguinal hernia often has no symptoms. Treatment may not be needed if you do not have symptoms. If you have symptoms or a large hernia, you may need surgery to repair the hernia. Avoid lifting heavy objects. Also, avoid standing for long periods of time. This information is not intended to replace advice given to you by your health care provider. Make sure you discuss any questions you have with your health care provider. Document Revised: 07/04/2020 Document Reviewed: 07/04/2020 Elsevier Patient Education  2022 [employer].

## 2022-01-04 NOTE — Progress Notes (Signed)
Subjective: CC: Lump in groin PCP: [name], DO Susan Fitzgerald is a 88 y.o. female presenting to clinic today for:  1.  Lump in groin Patient reports several week history of a left-sided lump in her groin.  She denies any pain but it is not something that was present before.  Her bowel movements have remained normal.  No nausea, vomiting, hematochezia or melena.  It goes down when she lays flat.  She continues to play golf without difficulty.  2.  Ear fullness Patient reports bilateral ear fullness that she would like to see if she needs her ears cleaned out today  3.  Skin lesions Patient reports that the skin lesion on the left knee has totally resolved after treatment.  However, she has lesions on her ears bilaterally that her dermatologist has put her on a special cream for.  She admits that she has not been very consistent with it.   ROS: Per HPI  Allergies  Allergen Reactions   Benazepril Hcl Cough   Past Medical History:  Diagnosis Date   Calcification of bronchial airway 06/23/2018   01/2017 - See on CT imaging of the chest    Cataract    Cerebral vascular disease 06/19/2018   06/19/2018 - MRI with chronic microvascular ischemic change   Diverticulosis    Hypertension    Osteoporosis    PAF (paroxysmal atrial fibrillation) ([place])    a. s/p prior DCCV; b. On flecainide & coumadin (CHA2DS2VASc = 4);  c. 03/2016 Echo: EF 55-60%, mod LVH, mod AI, mild to mod TR, PASP 77mmHg.   Pelvic fracture ([facility])    Pulmonary fibrosis ([place]) 2018   Rheumatoid arthritis ([place]) 2017    Current Outpatient Medications:    apixaban (ELIQUIS) 2.5 MG TABS tablet, Take 1 tablet (2.5 mg total) by mouth 2 (two) times daily., Disp: 180 tablet, Rfl: 3   azaTHIOprine (IMURAN) 50 MG tablet, Take 50 mg by mouth daily. , Disp: , Rfl:    calcium-vitamin D (OSCAL WITH D) 250-125 MG-UNIT per tablet, Take 1 tablet by mouth 2 (two) times daily., Disp: , Rfl:    diltiazem (CARDIZEM CD) 300 MG 24 hr  capsule, TAKE 1 CAPSULE BY MOUTH  DAILY, Disp: 90 capsule, Rfl: 3   ESBRIET 267 MG TABS, Take 3 tablets (801 mg total) by mouth with breakfast, with lunch, and with evening meal., Disp: 270 tablet, Rfl: 5   furosemide (LASIX) 20 MG tablet, TAKE 1 TO 2 TABLETS BY  MOUTH DAILY AS NEEDED (Patient taking differently: Take 20 mg by mouth daily.), Disp: 180 tablet, Rfl: 3   gabapentin (NEURONTIN) 100 MG capsule, Take 100 mg by mouth at bedtime. , Disp: , Rfl:    ketoconazole (NIZORAL) 2 % shampoo, Apply topically once a week., Disp: , Rfl:    lisinopril (ZESTRIL) 20 MG tablet, TAKE 1 TABLET BY MOUTH  DAILY, Disp: 90 tablet, Rfl: 3   lisinopril-hydrochlorothiazide (ZESTORETIC) 20-12.5 MG tablet, TAKE 1 TABLET BY MOUTH  DAILY IN THE MORNING, Disp: 90 tablet, Rfl: 3   Melatonin 5 MG TABS, Take 5 mg by mouth at bedtime., Disp: , Rfl:    Multiple Vitamin (MULTIVITAMIN) capsule, Take 1 capsule by mouth daily., Disp: , Rfl:    potassium chloride SA (KLOR-CON M) 20 MEQ tablet, TAKE 1 TABLET BY MOUTH  DAILY, Disp: 90 tablet, Rfl: 1   Probiotic Product (PROBIOTIC-10 ULTIMATE PO), Take 1 tablet by mouth daily., Disp: , Rfl:    levothyroxine (SYNTHROID)  75 MCG tablet, Take 1 tablet (75 mcg total) by mouth daily., Disp: 90 tablet, Rfl: 2 Social History   Socioeconomic History   Marital status: Married    Spouse name: Bill    Number of children: 2   Years of education: 12   Highest education level: Not on file  Occupational History   Occupation: RETIRED  Tobacco Use   Smoking status: Never   Smokeless tobacco: Never  Vaping Use   Vaping Use: Never used  Substance and Sexual Activity   Alcohol use: No   Drug use: No   Sexual activity: Yes  Other Topics Concern   Not on file  Social History Narrative   Lives with her husband.   Enjoys playing golf.   Still very active and independent   Social Determinants of Health   Financial Resource Strain: Low Risk    Difficulty of Paying Living Expenses: Not  hard at all  Food Insecurity: No Food Insecurity   Worried About Charity fundraiser in the Last Year: Never true   [place] in the Last Year: Never true  Transportation Needs: No Transportation Needs   Lack of Transportation (Medical): No   Lack of Transportation (Non-Medical): No  Physical Activity: Sufficiently Active   Days of Exercise per Week: 2 days   Minutes of Exercise per Session: 90 min  Stress: No Stress Concern Present   Feeling of Stress : Not at all  Social Connections: Moderately Integrated   Frequency of Communication with Friends and Family: More than three times a week   Frequency of Social Gatherings with Friends and Family: Twice a week   Attends Religious Services: More than 4 times per year   Active Member of [employer] or Organizations: No   Attends Music therapist: Never   Marital Status: Married  Human resources officer Violence: Not At Risk   Fear of Current or Ex-Partner: No   Emotionally Abused: No   Physically Abused: No   Sexually Abused: No   Family History  Problem Relation Age of Onset   Stroke Mother        cerebral hemorrhage   Heart disease Father        MI   Heart attack Father    Vision loss Father    Heart disease Brother    Heart attack Brother    Cancer Maternal Aunt        breast   Cancer Brother    Heart disease Brother    Cancer Daughter        breast   Heart disease Son    Colon cancer Neg Hx    Colon polyps Neg Hx    Rectal cancer Neg Hx    Stomach cancer Neg Hx     Objective: Office vital signs reviewed. BP (!) 144/78    Pulse 97    Temp (!) 97.5 F (36.4 C)    Ht 5' (1.524 m)    Wt 120 lb (54.4 kg)    SpO2 98%    BMI 23.44 kg/m   Physical Examination:  General: Awake, alert, well nourished, No acute distress HEENT: sclera white, MMM Cardio: regular rate and rhythm, S1S2 heard, no murmurs appreciated Pulm: clear to auscultation bilaterally, no wheezes, rhonchi or rales; normal work of breathing on room  air GI: soft, non-tender, non-distended, bowel sounds present x4, no hepatomegaly, no splenomegaly, no masses GU: Medium egg sized well-circumscribed soft tissue lesion consistent with a hernia noted  in the left inguinal space just above the mons pubis Skin: actinic keratoses noted on bilateral helix  Assessment/ Plan: 88 y.o. female   Inguinal hernia of left side with obstruction and without gangrene  Impacted cerumen of left ear  Actinic keratoses  Findings on exam are clinically consistent with an inguinal hernia.  Differential diagnoses include femoral hernia.  She has no red flag signs or symptoms.  The lesion is easily reducible on exam.  We discussed red flag signs and symptoms which would warrant further evaluation emergently and or by a general surgeon.  Handout was provided.  She will follow-up as needed on this issue  Could not irrigate cerumen of the left ear.  Debrox recommended.  She will return at a later date to have this done  Continue the 5-FU as prescribed by dermatology for her actinic keratoses of bilateral helix of the ears   No orders of the defined types were placed in this encounter.  No orders of the defined types were placed in this encounter.    [name], DO [place] [phone]

## 2022-01-11 ENCOUNTER — Encounter: Payer: Medicare Other | Admitting: Pharmacist

## 2022-01-11 DIAGNOSIS — I4819 Other persistent atrial fibrillation: Secondary | ICD-10-CM

## 2022-01-11 DIAGNOSIS — I10 Essential (primary) hypertension: Secondary | ICD-10-CM

## 2022-01-11 NOTE — Patient Instructions (Signed)
Visit Information  Following are the goals we discussed today:  Current Barriers:  Unable to independently afford treatment regimen  Pharmacist Clinical Goal(s):  Over the next 90 days, patient will verbalize ability to afford treatment regimen through collaboration with PharmD and provider.   Interventions: 1:1 collaboration with [name], DO regarding development and update of comprehensive plan of care as evidenced by provider attestation and co-signature Inter-disciplinary care team collaboration (see longitudinal plan of care) Comprehensive medication review performed; medication list updated in electronic medical record  Atrial Fibrillation: Controlled; current rate/rhythm control: dilt; anticoagulant treatment: ELIQUIS Patient was getting through [facility] in 2022, however they stated they were no longer servicing the patient Call made to [employer] patient assistance program for eliquis--need to send provider portion, will fax next week when in office Patient will have to meet the 3% out of pocket again for 2023--has not met yet, pt aware CHADS2VASc score: 5 Home blood pressure, heart rate readings: 110s/60-70s Denies signs & symptoms of bleeding Active/plays golf  Patient Goals/Self-Care Activities Over the next 90 days, patient will:  - take medications as prescribed  Follow Up Plan: The patient will call PharmD* as advised to when she mets out of pocket.     Plan: Telephone follow up appointment with care management team member scheduled for:  6 months  Signature Regina Eck, PharmD, BCPS Clinical Pharmacist, [place]  II Phone [phone]  Please call the care guide team at [phone] if you need to cancel or reschedule your appointment.   The patient verbalized understanding of instructions, educational materials, and care plan provided today and declined offer to receive copy of patient  instructions, educational materials, and care plan.

## 2022-01-11 NOTE — Progress Notes (Signed)
This encounter was created in error - please disregard.

## 2022-01-18 ENCOUNTER — Other Ambulatory Visit: Payer: Self-pay | Admitting: Family Medicine

## 2022-01-22 ENCOUNTER — Telehealth: Payer: Self-pay | Admitting: Family Medicine

## 2022-02-12 ENCOUNTER — Telehealth: Payer: Self-pay | Admitting: *Deleted

## 2022-02-12 ENCOUNTER — Other Ambulatory Visit: Payer: Medicare Other

## 2022-02-12 DIAGNOSIS — D696 Thrombocytopenia, unspecified: Secondary | ICD-10-CM | POA: Diagnosis not present

## 2022-02-12 DIAGNOSIS — M0579 Rheumatoid arthritis with rheumatoid factor of multiple sites without organ or systems involvement: Secondary | ICD-10-CM | POA: Diagnosis not present

## 2022-02-12 NOTE — Telephone Encounter (Signed)
Pt wants to know which Surgeon you were talking about her seeing if she wanted to for her Inguinal Hernia. Pt doesn't think she needs referral but just wanted the name, I couldn't see a specific name in the notes. ?

## 2022-02-12 NOTE — Telephone Encounter (Signed)
Dr Blake Divine ?

## 2022-02-12 NOTE — Telephone Encounter (Signed)
Pt aware of dr ?

## 2022-02-14 LAB — VITAMIN B12: Vitamin B-12: 1333 pg/mL — ABNORMAL HIGH (ref 232–1245)

## 2022-02-14 LAB — METHYLMALONIC ACID, SERUM: Methylmalonic Acid: 172 nmol/L (ref 0–378)

## 2022-02-14 LAB — FOLATE: Folate: >20 ng/mL (ref >3.0)

## 2022-02-18 ENCOUNTER — Other Ambulatory Visit (HOSPITAL_COMMUNITY): Payer: Medicare Other

## 2022-02-18 ENCOUNTER — Other Ambulatory Visit: Payer: Medicare Other

## 2022-02-18 ENCOUNTER — Telehealth: Payer: Self-pay | Admitting: Family Medicine

## 2022-02-18 NOTE — Telephone Encounter (Signed)
Tommi Myers from [facility] called to let us know that patient did not get her CBC drawn when she came in for labs.   ?She needs this drawn before her appointment with Tarri Abernethy on April 10th. ?LMOM at home to let patient know to come back in for this lab draw. ?

## 2022-02-22 NOTE — Progress Notes (Signed)
? ?Virtual Visit via Telephone Note ?[place] [place] ? ?I connected with Susan Fitzgerald  on 02/25/22 at  11:35 AM by telephone and verified that I am speaking with the correct person using two identifiers. ? ?Location: ?Patient: Home ?Provider: [facility] ?  ?I discussed the limitations, risks, security and privacy concerns of performing an evaluation and management service by telephone and the availability of in person appointments. I also discussed with the patient that there may be a patient responsible charge related to this service. The patient expressed understanding and agreed to proceed. ? ? ?HISTORY OF PRESENT ILLNESS: ?[name] follows at our clinic for thrombocytopenia.  She was last evaluated via telemedicine visit by Tarri Abernethy PA-C on 08/15/2021. ? ?At today's phone visit, she reports feeling fairly well.  She denies any recent hospitalizations, surgeries, or changes in her baseline health status.  She admits to easy bruising but denies petechial rash.  She reports that she bleeds easily when she cuts her self, but denies any abnormal bleeding such as epistaxis, hematemesis, hematochezia, melena, or hematuria.  She continues to take Eliquis for her atrial fibrillation. She denies any B symptoms such as fever, chills, night sweats, unintentional weight loss.  She denies any new masses or lymphadenopathy.  She remains active and golfs several times per week. ?She reports 80% energy and 60% appetite.  She is maintaining stable weight at this time. ? ?  ?OBSERVATIONS/OBJECTIVE: ?Review of Systems  ?Constitutional:  Negative for chills, diaphoresis, fever, malaise/fatigue and weight loss.  ?Respiratory:  Positive for cough and shortness of breath (with exertion).   ?Cardiovascular:  Positive for palpitations (AFib). Negative for chest pain.  ?Gastrointestinal:  Positive for constipation. Negative for abdominal pain, blood in stool, melena, nausea and vomiting.   ?Musculoskeletal:  Positive for back pain.  ?Neurological:  Positive for dizziness and tingling. Negative for headaches.  ?Psychiatric/Behavioral:  The patient has insomnia.    ? ?PHYSICAL EXAM (per limitations of virtual telephone visit): The patient is alert and oriented x 3, exhibiting adequate mentation, good mood, and ability to speak in full sentences and execute sound judgement. ? ? ?ASSESSMENT & PLAN: ?1.  Mild thrombocytopenia ?- Per review of labs, she has had mild thrombocytopenia for the last since July 2021, and a self-limited episode of mild thrombocytopenia in March 2018 during hospitalization ?- She does not have any anemia, hemoglobin derangements, or WBC abnormalities apparent on CBC. ?- Most recent abdominal imaging via abdominal ultrasound (09/24/2016): Spleen size and appearance within normal limits ?- Hematology work-up (04/11/2021):  ?Peripheral smear confirms thrombocytopenia ?H pylori and hepatitis negative ?No nutritional deficiencies (unremarkable folate, B12, methylmalonic acid, copper) ?- Most recent labs (02/12/2022): Normal platelets 155.  Normal B12, folate, methylmalonic acid. ?- She admits to easy bruising but denies any bleeding or petechial rash. ?- Differential diagnosis includes immune-mediated thrombocytopenia (positive history of rheumatoid arthritis), drug-related thrombocytopenia (azathioprine), and early MDS ?- No treatment indicated at this time.  Treatment for thrombocytopenia will be considered if platelets were less than 30, or in the event of significant bleeding. ?- PLAN: RTC in 1 year with same-day labs. ? ?2. Other history ?- PMH:  Chronic atrial fibrillation (Eliquis), pulmonary fibrosis (pirfenidone), rheumatoid arthritis (azathioprine),  and other comorbidities noted elsewhere in medical record ?-[name] lives at home with her husband and is functionally independent.  She enjoys an active retirement and plays golf several times per week.  She is retired from  work as  homemaker. ?- Lifelong nonsmoker; no alcohol or illicit drugs.  No chemical or pesticide exposure. ?- She denies any family history of blood disorders. Positive for heart disease and stroke.  The patient's daughter is deceased from breast cancer at age 26.  Patient's maternal aunt also had breast cancer.  Her brother had colon cancer. ? ?  ?I discussed the assessment and treatment plan with the patient. The patient was provided an opportunity to ask questions and all were answered. The patient agreed with the plan and demonstrated an understanding of the instructions. ?  ?The patient was advised to call back or seek an in-person evaluation if the symptoms worsen or if the condition fails to improve as anticipated. ? ?I provided 22 minutes of non-face-to-face time during this encounter. ? ?Harriett Rush, PA-C ?02/25/22 1:02 PM  ?

## 2022-02-25 ENCOUNTER — Inpatient Hospital Stay (HOSPITAL_COMMUNITY): Payer: Medicare Other | Attending: Physician Assistant | Admitting: Physician Assistant

## 2022-02-25 DIAGNOSIS — D696 Thrombocytopenia, unspecified: Secondary | ICD-10-CM

## 2022-03-01 ENCOUNTER — Telehealth: Payer: Self-pay | Admitting: Family Medicine

## 2022-03-01 DIAGNOSIS — K4030 Unilateral inguinal hernia, with obstruction, without gangrene, not specified as recurrent: Secondary | ICD-10-CM

## 2022-03-01 NOTE — Telephone Encounter (Signed)
Patient calling because she does not want to go to the surgeon that Dr Lajuana Ripple suggested. She stated that she would rather go to someone in [place] and wants a suggestion of where to call. Please call back.  ?

## 2022-03-04 ENCOUNTER — Telehealth: Payer: Self-pay | Admitting: Pulmonary Disease

## 2022-03-04 DIAGNOSIS — J849 Interstitial pulmonary disease, unspecified: Secondary | ICD-10-CM

## 2022-03-04 NOTE — Addendum Note (Signed)
Addended by: [name] on: 03/04/2022 11:13 AM ? ? Modules accepted: Orders ? ?

## 2022-03-04 NOTE — Telephone Encounter (Signed)
Pt aware.

## 2022-03-04 NOTE — Telephone Encounter (Signed)
Referral placed.

## 2022-03-04 NOTE — Telephone Encounter (Signed)
[facility] ?

## 2022-03-04 NOTE — Telephone Encounter (Signed)
Pt called stating that she called [facility] and was told to call her PCP office and get PCP to send them the referral. Pt says she would like to be seen there by Dr Rolm Bookbinder. ?

## 2022-03-06 ENCOUNTER — Ambulatory Visit (INDEPENDENT_AMBULATORY_CARE_PROVIDER_SITE_OTHER): Payer: Medicare Other | Admitting: Family Medicine

## 2022-03-06 ENCOUNTER — Ambulatory Visit: Payer: Medicare Other | Admitting: Pulmonary Disease

## 2022-03-06 ENCOUNTER — Encounter: Payer: Self-pay | Admitting: Pulmonary Disease

## 2022-03-06 ENCOUNTER — Encounter: Payer: Self-pay | Admitting: Family Medicine

## 2022-03-06 VITALS — BP 110/64 | HR 106 | Temp 98.7°F | Ht 60.0 in | Wt 122.0 lb

## 2022-03-06 VITALS — BP 130/78 | HR 90 | Temp 98.2°F | Ht 60.0 in | Wt 121.1 lb

## 2022-03-06 DIAGNOSIS — K4030 Unilateral inguinal hernia, with obstruction, without gangrene, not specified as recurrent: Secondary | ICD-10-CM

## 2022-03-06 DIAGNOSIS — J849 Interstitial pulmonary disease, unspecified: Secondary | ICD-10-CM

## 2022-03-06 DIAGNOSIS — Z5181 Encounter for therapeutic drug level monitoring: Secondary | ICD-10-CM

## 2022-03-06 DIAGNOSIS — R0602 Shortness of breath: Secondary | ICD-10-CM | POA: Diagnosis not present

## 2022-03-06 DIAGNOSIS — L989 Disorder of the skin and subcutaneous tissue, unspecified: Secondary | ICD-10-CM | POA: Diagnosis not present

## 2022-03-06 DIAGNOSIS — R911 Solitary pulmonary nodule: Secondary | ICD-10-CM | POA: Diagnosis not present

## 2022-03-06 MED ORDER — ESBRIET 267 MG PO TABS: 801.0000 mg | ORAL_TABLET | Freq: Three times a day (TID) | ORAL | 5 refills | Status: DC

## 2022-03-06 NOTE — Telephone Encounter (Signed)
Called and spoke with patient who states that she needs a refill on her Esbriet. Will route to pharmacy team ?

## 2022-03-06 NOTE — Progress Notes (Signed)
? ?  Acute Office Visit ? ?Subjective:  ? ?  ?Patient ID: Susan Fitzgerald, female    DOB: 07-Jun-1932, 88 y.o.   MRN: [id number] ? ?Chief Complaint  ?Patient presents with  ? Hernia  ? ? ?HPI ?Patient is in today for hernia follow up. She reports this has been present for 6 weeks. She isn't sure if it has gotten larger. If so, it is not by much. The area is not painful or discolored. She has been referred to [facility]. She will call today to set up an appointment. Denies fever.  ? ?She also has noticed a new lesion on her left shin. It has been present for a few months. It is raised. Somewhat tender and itchy. She also has a new mole on her left neck under her jaw line. She does have a dermatologist that she is established with.  ? ?ROS ?As per HPI.  ? ?   ?Objective:  ?  ?BP 130/78 Comment: at home reading per pt  Pulse 90   Temp 98.2 ?F (36.8 ?C) (Temporal)   Ht 5' (1.524 m)   Wt 121 lb 2 oz (54.9 kg)   BMI 23.66 kg/m?  ?BP Readings from Last 3 Encounters:  ?03/06/22 130/78  ?01/04/22 (!) 144/78  ?11/02/21 133/80  ? ?  ? ?Physical Exam ?Vitals and nursing note reviewed.  ?Constitutional:   ?   General: She is not in acute distress. ?   Appearance: She is not ill-appearing, toxic-appearing or diaphoretic.  ?Cardiovascular:  ?   Rate and Rhythm: Normal rate and regular rhythm.  ?   Heart sounds: Normal heart sounds. No murmur heard. ?Pulmonary:  ?   Effort: Pulmonary effort is normal. No respiratory distress.  ?   Breath sounds: Normal breath sounds.  ?Abdominal:  ?   Palpations: Abdomen is soft.  ?   Hernia: A hernia is present. Hernia is present in the left inguinal area (nontender, soft, no discoloration).  ?Skin: ?   General: Skin is warm and dry.  ?   Findings: Lesion (0.5 cm raised lightly pigmented lesion to left shin, scaly in appearance. 2 mm round lesion to left anterior neck with irregular borders a dark pigmentation) present.  ?Neurological:  ?   Mental Status: She is alert and oriented to person,  place, and time.  ?Psychiatric:     ?   Mood and Affect: Mood normal.     ?   Behavior: Behavior normal.  ? ? ? ? ?   ?Assessment & Plan:  ? ?Karyn was seen today for hernia. ? ?Diagnoses and all orders for this visit: ? ?Inguinal hernia of left side with obstruction and without gangrene ?Referral has been authorized for [facility]. Contact info for office given to patient, she will call today to schedule an appointment.  ? ?Skin lesion ?Atypical lesions. Discussed these will likely need to be biopsied. She will follow up with her dermatologist.  ? ?Return if symptoms worsen or fail to improve. ? ?The patient indicates understanding of these issues and agrees with the plan. ? ?Gwenlyn Perking, FNP ? ? ?

## 2022-03-06 NOTE — Patient Instructions (Signed)
Hernia, Adult     A hernia is the bulging of an organ or tissue through a weak spot in the muscles of the abdomen. Hernias develop most often near the belly button (navel) or the area where the leg meets the lower abdomen (groin). Common types of hernias include: Incisional hernia. This type bulges through a scar from an abdominal surgery. Umbilical hernia. This type develops near the navel. Inguinal hernia. This type develops in the groin or scrotum. Femoral hernia. This type develops below the groin, in the upper thigh area. Hiatal hernia. This type occurs when part of the stomach slides above the muscle that separates the abdomen from the chest (diaphragm). What are the causes? This condition may be caused by: Heavy lifting. Coughing over a long period of time. Straining to have a bowel movement. Constipation can lead to straining. An incision made during abdominal surgery. A physical problem that is present at birth (congenital defect). Being overweight or obese. Smoking. Excess fluid in the abdomen. Undescended testicles in males. What are the signs or symptoms? The main symptom is a skin-colored, rounded bulge in the area of the hernia. However, a bulge may not always be present. It may grow bigger or be more visible when you cough or strain (such as when lifting something heavy). A hernia that can be pushed back into the abdomen (is reducible) rarely causes pain. A hernia that cannot be pushed back into the abdomen (is incarcerated) may lose its blood supply (become strangulated). A hernia that is incarcerated may cause: Pain. Fever. Nausea and vomiting. Swelling. Constipation. How is this diagnosed? A hernia may be diagnosed based on: Your symptoms and medical history. A physical exam. Your health care provider may ask you to cough or move in certain ways to see if the hernia becomes visible. Imaging tests, such as: X-rays. Ultrasound. CT scan. How is this treated? A  hernia that is small and painless may not need to be treated. A hernia that is large or painful may be treated with surgery. Inguinal hernias may be treated with surgery to prevent incarceration or strangulation. Strangulated hernias are always treated with surgery because the strangulation causes a lack of blood supply to the trapped organ or tissue. Surgery to treat a hernia involves pushing the bulge back into place and repairing the weak area of the muscle or abdominal wall. Follow these instructions at home: Activity Avoid straining. Do not lift anything that is heavier than 10 lb (4.5 kg), or the limit that you are told, until your health care provider says that it is safe. When lifting heavy objects, lift with your leg muscles, not your back muscles. Preventing constipation Take actions to prevent constipation. Constipation leads to straining with bowel movements, which can make a hernia worse or cause a hernia repair to break down. Your health care provider may recommend that you take these actions to prevent or treat constipation: Drink enough fluid to keep your urine pale yellow. Take over-the-counter or prescription medicines. Eat foods that are high in fiber, such as beans, whole grains, and fresh fruits and vegetables. Limit foods that are high in fat and processed sugars, such as fried or sweet foods. General instructions When coughing, try to cough gently. You may try to push the hernia back in place by very gently pressing on it while lying down. Do not try to force the bulge back in if it will not push in easily. If you are overweight, work with your health care provider   to lose weight safely. Do not use any products that contain nicotine or tobacco. These products include cigarettes, chewing tobacco, and vaping devices, such as e-cigarettes. If you need help quitting, ask your health care provider. If you are scheduled for hernia repair, watch your hernia for any changes in shape,  size, or color. Tell your health care provider about any changes or new symptoms. Take over-the-counter and prescription medicines only as told by your health care provider. Keep all follow-up visits. This is important. Contact a health care provider if: You develop new pain, swelling, or redness around your hernia. You have signs of constipation, such as: Fewer bowel movements in a week than normal. Difficulty having a bowel movement. Stools that are dry, hard, or larger than normal. Get help right away if: You have a fever or chills. You have abdominal pain that gets worse. You feel nauseous or you vomit. You cannot push the hernia back in place by very gently pressing on it while lying down. Do not try to force the bulge back in if it will not go in easily. The hernia: Changes in shape, size, or color. Feels hard or tender. These symptoms may represent a serious problem that is an emergency. Do not wait to see if the symptoms will go away. Get medical help right away. Call your local emergency services (911 in the [place] Do not drive yourself to the hospital. Summary A hernia is the bulging of an organ or tissue through a weak spot in the muscles of the abdomen. The main symptom is a skin-colored bulge in the hernia area. However, a bulge may not always be present. It may grow bigger or more visible when you cough or strain (such as when having a bowel movement). A hernia that is small and painless may not need to be treated. A hernia that is large or painful may be treated with surgery. Surgery to treat a hernia involves pushing the bulge back into place and repairing the weak part of the abdomen. This information is not intended to replace advice given to you by your health care provider. Make sure you discuss any questions you have with your health care provider. Document Revised: 06/12/2020 Document Reviewed: 06/12/2020 Elsevier Patient Education  2023 Elsevier Inc.  

## 2022-03-06 NOTE — Progress Notes (Signed)
CT ?       ? ?      Susan Fitzgerald    [id number]    [birth year] ? ?Primary Care Physician:Gottschalk, Koleen Distance, DO ? ?Referring Physician: [name], DO ?[address] [place],  [place] [address] ? ?Problem list: ?RA ILD ?On imuran ?Ofev poorly tolerated due to side effects ?On Esbriet since December 2020 ? ?HPI: ?88 year old with past medical history of atrial fibrillation, rheumatoid arthritis, pulmonary fibrosis.  Referred here for a second opinion on pulmonary fibrosis from Dr. Valeta Harms ? ?She has been diagnosed with rheumatoid arthritis around 2017.  She follows with Dr. Amil Amen.  She was initially on methotrexate which was held briefly in 2019 due to elevated creatinine, but restarted around November 2019 for worsening symptoms.  She did not tolerate leflunomide due to significant hair loss.  Methotrexate was stopped and she was started started on azathioprine after discussion with Dr. Amil Amen in March 2020 ? 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  This was poorly tolerated due to diarrhea, constipation and lower GI bleed ?Antifibrotic's change to Esbriet in December 2020 which is tolerated much better than Ofev ? ?Finished pulmonary rehab in 2020 with improvement in symptoms of dyspnea.  She is back to golfing ? ?Diagnosed with COVID-07 October 2019.  Did not require hospitalization. ?Follows with Dr. Percival Spanish for management of atrial fibrillation ? ?Extended ILD questionnaire 02/12/2019-no known exposures, no mold, hot tub, Jacuzzi, humidifier, no birds at home. ?Smoking history: Never smoker ?Travel history: No significant travel history ?Relevant family history: No family history of lung disease or pulmonary fibrosis. ? ?Interim history: ?Continues on Esbriet.  This was changed to generic pirfenidone in summer 2022.  She does not like the full effect as the pills are bigger and went back to Esbriet in September 2022 ? ?Overall she is doing well  with good exercise capacity.  Continues to golf several times a week. ?Dyspnea on exertion is stable. ? ?She needs a refill on her medication as this was not filled in through the pharmacy. ? ?Outpatient Encounter Medications as of 03/06/2022  ?Medication Sig  ? apixaban (ELIQUIS) 2.5 MG TABS tablet Take 1 tablet (2.5 mg total) by mouth 2 (two) times daily.  ? azaTHIOprine (IMURAN) 50 MG tablet Take 50 mg by mouth daily.   ? calcium-vitamin D (OSCAL WITH D) 250-125 MG-UNIT per tablet Take 1 tablet by mouth 2 (two) times daily.  ? diltiazem (CARDIZEM CD) 300 MG 24 hr capsule TAKE 1 CAPSULE BY MOUTH  DAILY  ? furosemide (LASIX) 20 MG tablet TAKE 1 TO 2 TABLETS BY  MOUTH DAILY AS NEEDED (Patient taking differently: Take 20 mg by mouth daily.)  ? gabapentin (NEURONTIN) 100 MG capsule Take 100 mg by mouth at bedtime.   ? ketoconazole (NIZORAL) 2 % shampoo Apply topically once a week.  ? levothyroxine (SYNTHROID) 75 MCG tablet TAKE 1 TABLET BY MOUTH  DAILY  ? lisinopril (ZESTRIL) 20 MG tablet TAKE 1 TABLET BY MOUTH  DAILY  ? lisinopril-hydrochlorothiazide (ZESTORETIC) 20-12.5 MG tablet TAKE 1 TABLET BY MOUTH  DAILY IN THE MORNING  ? Melatonin 5 MG TABS Take 5 mg by mouth at bedtime.  ? Multiple Vitamin (MULTIVITAMIN) capsule Take 1 capsule by mouth daily.  ? potassium chloride SA (KLOR-CON M) 20 MEQ tablet TAKE 1 TABLET BY MOUTH  DAILY  ? Probiotic Product (PROBIOTIC-10 ULTIMATE PO) Take 1 tablet by mouth daily.  ? [DISCONTINUED] ESBRIET  267 MG TABS Take 3 tablets (801 mg total) by mouth with breakfast, with lunch, and with evening meal.  ? ESBRIET 267 MG TABS Take 3 tablets (801 mg total) by mouth with breakfast, with lunch, and with evening meal.  ? ?No facility-administered encounter medications on file as of 03/06/2022.  ? ?Physical Exam: ?Blood pressure 110/64, pulse (!) 106, temperature 98.7 ?F (37.1 ?C), temperature source Oral, height 5' (1.524 m), weight 122 lb (55.3 kg), SpO2 96 %. ?Gen:      No acute  distress ?HEENT:  EOMI, sclera anicteric ?Neck:     No masses; no thyromegaly ?Lungs:    Bibasal crackles ?CV:         Regular rate and rhythm; no murmurs ?Abd:      + bowel sounds; soft, non-tender; no palpable masses, no distension ?Ext:    No edema; adequate peripheral perfusion ?Skin:      Warm and dry; no rash ?Neuro: alert and oriented x 3 ?Psych: normal mood and affect  ? ?Data Reviewed: ?Imaging: ?CT chest 02/09/2017- mild reticulation, interstitial prominence at the bases. ?CT chest 06/23/2018- groundglass attenuation, interlobular septal thickening, subpleural reticulation mild air trapping. ?CT chest 01/13/2019- peripheral and basilar pattern of subpleural reticulation, groundglass and traction bronchiectasis.  No honeycombing.  Probable UIP pattern. ?CT high-resolution 08/31/2019-stable pulmonary fibrosis probable UIP pattern ?CT high-resolution 09/06/2020-stable probable UIP pattern pulmonary fibrosis ?CT high-resolution 09/10/2021-stable pulmonary fibrosis, new 5 mm lung nodule ?I reviewed the images personally. ? ?PFTs: ?01/20/2019 ?FVC 2.17 [114%), FEV1 1.67 [120%],/F 77, TLC 78%, DLCO 56% ?Minimal restriction with moderate diffusion impairment. ? ?06/05/2020 ?FVC 2.17 [119%], FEV1 1.68 [127%], F/F 77, TLC 4.36 [99%], DLCO 10.30 [65%] ?Mild diffusion impairment.  Improvement compared to 2020 ? ?Labs: ?[facility]P 02/15/2016-24 ?Rheumatoid factor 02/14/2018-23.2 ? ?Assessment:  ?Pulmonary fibrosis, RA-ILD ?She has progressive pulmonary fibrosis from 2018-20 in probable UIP pattern ?This is likely from RA-ILD.  Other considerations include amiodarone toxicity but she has been off amiodarone for some time now.  Methotrexate can cause pulmonary fibrosis in this pattern as well. Would not recommend work-up for IPF such as lung biopsy as it would not change our recommended management.  ? ?Did not tolerate Ofev but is doing well with esbriet.  She prefers to take 3 tablets 3 times a day as she does not like the bigger  single dose tablet ?Finished pulmonary rehab and remains active with golf ?Check hepatic panel and proBNP for monitoring ? ?Follow-up high-resolution CT already ordered for September 2023 ? ?Lung nodule ?Follow-up on repeat CT scan ? ?Health maintenance ?Flu vaccine today ?Up-to-date with COVID-19 ? ?Plan/Recommendations: ?- Continue brand-name Esbriet.  Resend prescription ?- Check labs for monitoring ?- High-res CT follow-up ? ?Marshell Garfinkel MD ?[facility] ?03/06/2022, 3:08 PM ? ?CC: [name], DO ? ?

## 2022-03-06 NOTE — Patient Instructions (Signed)
Will work with the pharmacy team to get the prescription for Esbriet. ?We will check labs today including comprehensive metabolic panel and proBNP for dyspnea ?I will see you at the end of September or early October for review of schedule CT scan. ?

## 2022-03-06 NOTE — Telephone Encounter (Signed)
Dr. [name] Browner has been made aware that patient is coming in today and needs refill on her Esbriet. Nothing further needed at this time.  ?

## 2022-03-06 NOTE — Telephone Encounter (Signed)
Pt was requiring f/u appt prior to having additional refills sent in--be advised she has appt scheduled for 3pm this afternoon with Dr. [name] Browner. ?

## 2022-03-06 NOTE — Telephone Encounter (Signed)
Esbriet refill electronically sent to [facility].  I called [facility] to make sure Esbriet prescription was received and spoke with Marinus Maw.  Marinus Maw stated Esbriet prescription was received today and medication was ready for patient consent to ship.  ?I called patient and made her aware she could call this afternoon to schedule home shipment. Advised patient to call office if there are any issues.  Understanding stated.  Nothing further at this time. ?

## 2022-03-07 LAB — COMPREHENSIVE METABOLIC PANEL
ALT: 10 U/L (ref 0–35)
AST: 23 U/L (ref 0–37)
Albumin: 3.8 g/dL (ref 3.5–5.2)
Alkaline Phosphatase: 60 U/L (ref 39–117)
BUN: 27 mg/dL — ABNORMAL HIGH (ref 6–23)
CO2: 34 mEq/L — ABNORMAL HIGH (ref 19–32)
Calcium: 9.2 mg/dL (ref 8.4–10.5)
Chloride: 100 mEq/L (ref 96–112)
Creatinine, Ser: 1.19 mg/dL (ref 0.40–1.20)
GFR: 40.99 mL/min — ABNORMAL LOW (ref >60.00)
Glucose, Bld: 96 mg/dL (ref 70–99)
Potassium: 4 mEq/L (ref 3.5–5.1)
Sodium: 139 mEq/L (ref 135–145)
Total Bilirubin: 0.4 mg/dL (ref 0.2–1.2)
Total Protein: 6.7 g/dL (ref 6.0–8.3)

## 2022-03-08 ENCOUNTER — Encounter: Payer: Self-pay | Admitting: Pulmonary Disease

## 2022-03-08 DIAGNOSIS — J849 Interstitial pulmonary disease, unspecified: Secondary | ICD-10-CM

## 2022-03-08 DIAGNOSIS — I272 Pulmonary hypertension, unspecified: Secondary | ICD-10-CM

## 2022-03-08 LAB — PRO B NATRIURETIC PEPTIDE: NT-Pro BNP: 2041 pg/mL — ABNORMAL HIGH (ref 0–738)

## 2022-03-14 NOTE — Telephone Encounter (Signed)
The elevated BNP can indicate either heart failure or pulmonary HTN which we are monitoring since she has IPF. I called and discussed this with her. ? ?Will cc Dr. Warren Lacy to see if we can get an echo for evaluation ?

## 2022-03-15 NOTE — Telephone Encounter (Signed)
ECHO ordered as requested ?

## 2022-03-27 ENCOUNTER — Telehealth: Payer: Self-pay | Admitting: Family Medicine

## 2022-03-27 NOTE — Telephone Encounter (Signed)
?  Prescription Request ? ?03/27/2022 ? ?Is this a "Controlled Substance" medicine? NO ? ?Have you seen your PCP in the last 2 weeks? NO ? ?If YES, route message to pool  -  If NO, patient needs to be scheduled for appointment. ? ?What is the name of the medication or equipment? Ondansetron-ODT-Tab 4 mg - Not on med list but likes to have on hand and wants #30. Don't take it often. ? ?Have you contacted your pharmacy to request a refill? NO  ? ?Which pharmacy would you like this sent to? [facility] ? ? ?Patient notified that their request is being sent to the clinical staff for review and that they should receive a response within 2 business days.  ?

## 2022-03-27 NOTE — Telephone Encounter (Signed)
I'm glad to prescribe at our visit.  If she is actively nauseated, please put her on the same day schedule and I can prescribe it that way ?

## 2022-03-27 NOTE — Telephone Encounter (Signed)
Please advise Next OV 04/05/22 ?

## 2022-03-27 NOTE — Telephone Encounter (Signed)
Pt ok to wait until 5/19 appt ?

## 2022-03-29 ENCOUNTER — Telehealth: Payer: Self-pay | Admitting: *Deleted

## 2022-03-29 ENCOUNTER — Ambulatory Visit: Payer: Self-pay | Admitting: General Surgery

## 2022-03-29 DIAGNOSIS — K4190 Unilateral femoral hernia, without obstruction or gangrene, not specified as recurrent: Secondary | ICD-10-CM | POA: Diagnosis not present

## 2022-03-29 NOTE — H&P (Signed)
?Chief Complaint: New Patient (Left inguinal hernia ) ?  ?  ?  ?History of Present Illness: ?Susan Fitzgerald is a 88 y.o. female who is seen today as an office consultation at the request of Dr. Lajuana Ripple for evaluation of New Patient (Left inguinal hernia ) ?Susan Fitzgerald   ?Patient is an 88 year old female, with a history of hypertension, hypothyroidism, pulmonary fibrosis, due to arthritis, who comes in with a left inguinal hernia.  Patient states that a small bulge has been there for approximately 2 months.  Patient states that she has not noticed any pain however has noticed is gotten larger.  Patient's had no signs of obstruction to the area.  Patient is very active and plays golf twice a week. ?  ?Patient's had no previous abdominal surgery ?  ?Patient sees Dr. Kimber Relic with pulmonology as well as Dr. Virginia Rochester her cardiologist.  Patient currently is on Eliquis. ?  ?  ?Review of Systems: ?A complete review of systems was obtained from the patient.  I have reviewed this information and discussed as appropriate with the patient.  See HPI as well for other ROS. ?  ?Review of Systems  ?Constitutional: Negative for fever.  ?HENT: Negative for congestion.   ?Eyes: Negative for blurred vision.  ?Respiratory: Negative for cough, shortness of breath and wheezing.   ?Cardiovascular: Negative for chest pain and palpitations.  ?Gastrointestinal: Negative for heartburn.  ?Genitourinary: Negative for dysuria.  ?Musculoskeletal: Negative for myalgias.  ?Skin: Negative for rash.  ?Neurological: Negative for dizziness and headaches.  ?Psychiatric/Behavioral: Negative for depression and suicidal ideas.  ?All other systems reviewed and are negative. ?  ?  ?  ?Medical History: ?Past Medical History ?Past Medical History: ?Diagnosis Date ? Arthritis   ? Hypertension   ? Pulmonary hypertension (CMS-HCC)   ? Thyroid disease   ? ?  ?  ?There is no problem list on file for this patient. ?  ?  ?Past Surgical History ?Past Surgical  History: ?Procedure Laterality Date ? BREAST EXCISIONAL BIOPSY Left   ? ?  ?  ?Allergies ?Allergies ?Allergen Reactions ? Benazepril Hcl Cough ? ?  ?  ?Current Outpatient Medications on File Prior to Visit ?Medication Sig Dispense Refill ? acetaminophen (TYLENOL) 500 mg capsule 1 capsule as needed     ? azaTHIOprine (IMURAN) 50 mg tablet Take 1 tablet by mouth once daily     ? FUROsemide (LASIX) 20 MG tablet       ? gabapentin (NEURONTIN) 100 MG capsule TAKE 1 TO 3 CAPSULES BY  MOUTH ONCE DAILY     ? levothyroxine (SYNTHROID) 75 MCG tablet 1 tablet on an empty stomach in the morning     ? lisinopriL (ZESTRIL) 20 MG tablet 1 tablet     ? melatonin 5 mg Tab Take 5 mg by mouth at bedtime     ? multivitamin capsule Take 1 capsule by mouth once daily     ? potassium chloride (KLOR-CON) 20 MEQ ER tablet       ? Saccharomyces boulardii (FLORASTOR) 250 mg capsule 1 capsule     ?  ?No current facility-administered medications on file prior to visit. ?  ?  ?Family History ?Family History ?Problem Relation Age of Onset ? High blood pressure (Hypertension) Mother   ? Coronary Artery Disease (Blocked arteries around heart) Father   ? High blood pressure (Hypertension) Brother   ? Coronary Artery Disease (Blocked arteries around heart) Brother   ? Colon cancer Brother   ? ?  ?  ?  Social History ?  ?Tobacco Use ?Smoking Status Never ?Smokeless Tobacco Never ?  ?  ?Social History ?Social History ?  ? ?Socioeconomic History ? Marital status: Unknown ?Tobacco Use ? Smoking status: Never ? Smokeless tobacco: Never ?Substance and Sexual Activity ? Alcohol use: Never ? Drug use: Never ? ?  ?  ?Objective: ?  ?  ?Vitals: ?  03/29/22 1005 ?BP: 132/80 ?Pulse: 74 ?Temp: 36.4 ?C (97.6 ?F) ?SpO2: 96% ?Weight: 54.5 kg (120 lb 3.2 oz) ?Height: 152.4 cm (5') ?  ?Body mass index is 23.47 kg/m?Susan Fitzgerald ?Physical Exam ?Constitutional:   ?   Appearance: Normal appearance.  ?HENT:  ?   Head: Normocephalic and atraumatic.  ?   Mouth/Throat:  ?   Mouth: Mucous  membranes are moist.  ?   Pharynx: Oropharynx is clear.  ?Eyes:  ?   General: No scleral icterus. ?   Pupils: Pupils are equal, round, and reactive to light.  ?Cardiovascular:  ?   Rate and Rhythm: Normal rate and regular rhythm.  ?   Pulses: Normal pulses.  ?   Heart sounds: No murmur heard. ?  No friction rub. No gallop.  ?Pulmonary:  ?   Effort: Pulmonary effort is normal. No respiratory distress.  ?   Breath sounds: Normal breath sounds. No stridor.  ?Abdominal:  ?   General: Abdomen is flat.  ?   Hernia: A hernia is present. Hernia is present in the left inguinal area.  ?Musculoskeletal:     ?   General: No swelling.  ?Skin: ?   General: Skin is warm.  ?Neurological:  ?   General: No focal deficit present.  ?   Mental Status: She is alert and oriented to person, place, and time. Mental status is at baseline.  ?Psychiatric:     ?   Mood and Affect: Mood normal.     ?   Thought Content: Thought content normal.     ?   Judgment: Judgment normal.  ?  ?  ?  ?  ?Assessment and Plan: ?Diagnoses and all orders for this visit: ?  ?Unilateral femoral hernia without obstruction or gangrene, recurrence not specified ?  ?  ?Susan Fitzgerald is a 88 y.o. female  ?  ?We will obtain clearance from her pulmonary physician and cardiologist.  Patient will also need to be off her Eliquis prior to surgery. ?  ?1.  We will proceed to the OR for a laparoscopic versus open left femoral hernia repair with mesh. ?2. All risks and benefits were discussed with the patient, to generally include infection, bleeding, damage to surrounding structures, acute and chronic nerve pain, and recurrence. Alternatives were offered and described.  All questions were answered and the patient voiced understanding of the procedure and wishes to proceed at this point. ?  ?  ?  ?  ?  ?  ?No follow-ups on file. ?  ?Ralene Ok, MD, FACS ?[facility], [place] ?General & Minimally Invasive Surgery ? ?

## 2022-03-29 NOTE — Telephone Encounter (Signed)
? ?  Pre-operative Risk Assessment  ?  ?Patient Name: Susan Fitzgerald  ?DOB: February 25, 1932 ?MRN: [id number]  ? ?  ? ?Request for Surgical Clearance   ? ?Procedure:  hernia surgery ? ?Date of Surgery:  Clearance TBD                              ?   ?Surgeon:  Ralene Ok md ?Surgeon's Group or Practice Name:  [facility] ?Phone number:  [phone] ?Fax number:  [phone] ?  ?Type of Clearance Requested:   ?- Pharmacy:  Hold Apixaban (Eliquis) they need direction ?  ?Type of Anesthesia:  General  ?  ?Additional requests/questions:   ? ?Signed, ?Fredia Beets   ?03/29/2022, 1:13 PM   ?

## 2022-04-01 ENCOUNTER — Ambulatory Visit (HOSPITAL_COMMUNITY): Payer: Medicare Other | Attending: Cardiovascular Disease

## 2022-04-01 ENCOUNTER — Telehealth: Payer: Self-pay | Admitting: Pulmonary Disease

## 2022-04-01 ENCOUNTER — Encounter: Payer: Self-pay | Admitting: Cardiology

## 2022-04-01 ENCOUNTER — Telehealth: Payer: Self-pay | Admitting: Family Medicine

## 2022-04-01 DIAGNOSIS — I272 Pulmonary hypertension, unspecified: Secondary | ICD-10-CM | POA: Diagnosis not present

## 2022-04-01 DIAGNOSIS — J849 Interstitial pulmonary disease, unspecified: Secondary | ICD-10-CM

## 2022-04-01 LAB — ECHOCARDIOGRAM COMPLETE
Area-P 1/2: 2.91 cm2
P 1/2 time: 508 msec
S' Lateral: 3.35 cm

## 2022-04-01 NOTE — Telephone Encounter (Signed)
Patient with diagnosis of A Fib on Eliquis for anticoagulation.   ? ?Procedure: hernia surgery ?Date of procedure: TBD ? ? ?CHA2DS2-VASc Score = 5  ?This indicates a 7.2% annual risk of stroke. ?The patient's score is based upon: ?CHF History: 0 ?HTN History: 1 ?Diabetes History: 0 ?Stroke History: 0 ?Vascular Disease History: 1 ?Age Score: 2 ?Gender Score: 1 ? ?CrCl 29 mL/min ?Platelet count 155K ? ?Per office protocol, patient can hold Eliquis for 2 days prior to procedure.   ?

## 2022-04-01 NOTE — Telephone Encounter (Signed)
S/w the pt and she is agreeable to plan of care for IN OFFICE APPT for pre op clearance. Pt has appt 05/29/22 with Dr. Percival Spanish. We have moved appt up sooner for the need of pre op clearance. I did inform the pt that I will not cancel the 05/29/22 appt, that she may d/w Dr. Percival Spanish , if ok to cancel then he will have his nurse cancel the 05/2022 appt. I will send FYI to requesting office the pt appt has been moved sooner for pre op clearance.  ?

## 2022-04-01 NOTE — Telephone Encounter (Signed)
Pt aware she does not have any labs due as of now ?

## 2022-04-01 NOTE — Telephone Encounter (Signed)
Fax received from Dr. Ralene Ok with [facility] to perform hernia surgery on patient.  Patient needs surgery clearance. Patient was seen on 03/06/2022. Office protocol is a risk assessment can be sent to surgeon if patient has been seen in 60 days or less.  ? ?Sending to Dr. [name] Browner for risk assessment or recommendations if patient needs to be seen in office prior to surgical procedure.   ?

## 2022-04-01 NOTE — Telephone Encounter (Signed)
? ?  Name: Susan Fitzgerald  ?DOB: 1932-09-02  ?MRN: [id number] ? ?Primary Cardiologist: Minus Breeding, MD ? ?Chart reviewed as part of pre-operative protocol coverage. Because of Tamyia S Antony's past medical history and time since last visit, she will require a follow-up in-office visit in order to better assess preoperative cardiovascular risk. ? ?Pre-op covering staff: ?- Please schedule appointment and call patient to inform them. If patient already had an upcoming appointment within acceptable timeframe, please add "pre-op clearance" to the appointment notes so provider is aware. ?- Please contact requesting surgeon's office via preferred method (i.e, phone, fax) to inform them of need for appointment prior to surgery. ? ?Patient with diagnosis of A Fib on Eliquis for anticoagulation.   ?  ?Procedure: hernia surgery ?Date of procedure: TBD ?  ?  ?CHA2DS2-VASc Score = 5  ?This indicates a 7.2% annual risk of stroke. ?The patient's score is based upon: ?CHF History: 0 ?HTN History: 1 ?Diabetes History: 0 ?Stroke History: 0 ?Vascular Disease History: 1 ?Age Score: 2 ?Gender Score: 1 ?  ?CrCl 29 mL/min ?Platelet count 155K ?  ?Per office protocol, patient can hold Eliquis for 2 days prior to procedure.  ? ?Thank you, ? ?Lenna Sciara, NP  ?04/01/2022, 11:20 AM  ? ?

## 2022-04-04 NOTE — Telephone Encounter (Signed)
Please advise if patient is cleared for surgery. Patient was seen on 03/06/2022.

## 2022-04-05 ENCOUNTER — Ambulatory Visit (INDEPENDENT_AMBULATORY_CARE_PROVIDER_SITE_OTHER): Payer: Medicare Other | Admitting: Family Medicine

## 2022-04-05 ENCOUNTER — Encounter: Payer: Self-pay | Admitting: Pulmonary Disease

## 2022-04-05 ENCOUNTER — Encounter: Payer: Self-pay | Admitting: Family Medicine

## 2022-04-05 VITALS — BP 131/69 | HR 85 | Temp 97.9°F | Ht 60.0 in | Wt 119.4 lb

## 2022-04-05 DIAGNOSIS — I4819 Other persistent atrial fibrillation: Secondary | ICD-10-CM

## 2022-04-05 DIAGNOSIS — N1831 Chronic kidney disease, stage 3a: Secondary | ICD-10-CM

## 2022-04-05 DIAGNOSIS — E039 Hypothyroidism, unspecified: Secondary | ICD-10-CM

## 2022-04-05 DIAGNOSIS — K4030 Unilateral inguinal hernia, with obstruction, without gangrene, not specified as recurrent: Secondary | ICD-10-CM | POA: Diagnosis not present

## 2022-04-05 DIAGNOSIS — I10 Essential (primary) hypertension: Secondary | ICD-10-CM | POA: Diagnosis not present

## 2022-04-05 DIAGNOSIS — R110 Nausea: Secondary | ICD-10-CM | POA: Diagnosis not present

## 2022-04-05 MED ORDER — ONDANSETRON 4 MG PO TBDP: 4.0000 mg | ORAL_TABLET | Freq: Three times a day (TID) | ORAL | 1 refills | Status: DC | PRN

## 2022-04-05 NOTE — Progress Notes (Signed)
PCCM note  Asked to do preop pulmonary eval for patient as she is due to undergo inguinal hernia repair  Patient has history of pulmonary fibrosis, ILD from rheumatoid arthritis on Ofev.  Symptoms have been stable on medications with good exercise capacity Last PFT showed mild diffusion impairment and preserved lung volumes  She is at increased risk of perioperative pulmonary complications due to age and pulmonary fibrosis but this is not prohibitive for planned surgery.  Per [facility]) preoperative pulmonary risk index in adults she has Low risk: 6.7% pulmonary complication rate  Peri-operative Assessment of Pulmonary Risk for Non-Thoracic Surgery:  ForMs. Mcnicholas, risk of perioperative pulmonary complications is increased by:  Age greater than 78 years, interstitial lung disease    Respiratory complications generally occur in 1% of ASA Class I patients, 5% of ASA Class II and 10% of ASA Class III-IV patients These complications rarely result in mortality and iclude postoperative pneumonia, atelectasis, pulmonary embolism, ARDS and increased time requiring postoperative mechanical ventilation.  Overall, I recommend proceeding with the surgery if the risk for respiratory complications are outweighed by the potential benefits. This will need to be discussed between the patient and surgeon.  To reduce risks of respiratory complications, I recommend: --Pre- and post-operative incentive spirometry performed frequently while awake --Avoiding use of pancuronium during anesthesia.  Marshell Garfinkel MD [facility] 04/05/2022, 2:40 PM

## 2022-04-05 NOTE — Progress Notes (Signed)
Subjective: CC: Chronic follow-up PCP: Susan Norlander, DO AJO:INOMV S Susan Fitzgerald is a 88 y.o. female presenting to clinic today for:  1.  Inguinal hernia Patient is under the care of Dr. Rosendo Fitzgerald for her left-sided inguinal hernia.  She notes that actually had completely reduced at 1 point then recurred several hours later.  She does not report any changes in bowel habits or bleeding in stool.  There is plans to have surgical intervention for this but she has to get cardiac clearance first.  She will have an appoint with Dr. Percival Fitzgerald on the 31st.  2.  Pulmonary disease Patient is compliant with her medications.  She has an appointment with Dr. [name] Fitzgerald every 6 months and CT scan every year.  She is tolerating Esbriet medication better than previous.  3.  Intermittent nausea Patient reports intermittent nausea.  This does not occur often but when it does the Zofran is helpful.  She is asking to have a supply on hand if she needs it.  4.  Hypothyroidism, atrial fibrillation Compliant with all medications.  Does not report any heart palpitations, bleeding.   ROS: Per HPI  Allergies  Allergen Reactions   Benazepril Hcl Cough   Past Medical History:  Diagnosis Date   Calcification of bronchial airway 06/23/2018   01/2017 - See on CT imaging of the chest    Cataract    Cerebral vascular disease 06/19/2018   06/19/2018 - MRI with chronic microvascular ischemic change   Diverticulosis    Hypertension    Osteoporosis    PAF (paroxysmal atrial fibrillation) ([place])    a. s/p prior DCCV; b. On flecainide & coumadin (CHA2DS2VASc = 4);  c. 03/2016 Echo: EF 55-60%, mod LVH, mod AI, mild to mod TR, PASP 73mHg.   Pelvic fracture ([facility])    Pulmonary fibrosis (Susan Fitzgerald 2018   Rheumatoid arthritis ([place] 2017    Current Outpatient Medications:    apixaban (ELIQUIS) 2.5 MG TABS tablet, Take 1 tablet (2.5 mg total) by mouth 2 (two) times daily., Disp: 180 tablet, Rfl: 3   azaTHIOprine (IMURAN) 50 MG tablet,  Take 50 mg by mouth daily. , Disp: , Rfl:    calcium-vitamin D (OSCAL WITH D) 250-125 MG-UNIT per tablet, Take 1 tablet by mouth 2 (two) times daily., Disp: , Rfl:    diltiazem (CARDIZEM CD) 300 MG 24 hr capsule, TAKE 1 CAPSULE BY MOUTH  DAILY, Disp: 90 capsule, Rfl: 3   ESBRIET 267 MG TABS, Take 3 tablets (801 mg total) by mouth with breakfast, with lunch, and with evening meal., Disp: 270 tablet, Rfl: 5   furosemide (LASIX) 20 MG tablet, TAKE 1 TO 2 TABLETS BY  MOUTH DAILY AS NEEDED (Patient taking differently: Take 20 mg by mouth daily.), Disp: 180 tablet, Rfl: 3   gabapentin (NEURONTIN) 100 MG capsule, Take 100 mg by mouth at bedtime. , Disp: , Rfl:    ketoconazole (NIZORAL) 2 % shampoo, Apply topically once a week., Disp: , Rfl:    levothyroxine (SYNTHROID) 75 MCG tablet, TAKE 1 TABLET BY MOUTH  DAILY, Disp: 90 tablet, Rfl: 2   lisinopril (ZESTRIL) 20 MG tablet, TAKE 1 TABLET BY MOUTH  DAILY, Disp: 90 tablet, Rfl: 3   lisinopril-hydrochlorothiazide (ZESTORETIC) 20-12.5 MG tablet, TAKE 1 TABLET BY MOUTH  DAILY IN THE MORNING, Disp: 90 tablet, Rfl: 3   Melatonin 5 MG TABS, Take 5 mg by mouth at bedtime., Disp: , Rfl:    Multiple Vitamin (MULTIVITAMIN) capsule, Take 1 capsule by  mouth daily., Disp: , Rfl:    potassium chloride SA (KLOR-CON M) 20 MEQ tablet, TAKE 1 TABLET BY MOUTH  DAILY, Disp: 90 tablet, Rfl: 1   Probiotic Product (PROBIOTIC-10 ULTIMATE PO), Take 1 tablet by mouth daily., Disp: , Rfl:  Social History   Socioeconomic History   Marital status: Married    Spouse name: Bill    Number of children: 2   Years of education: 12   Highest education level: Not on file  Occupational History   Occupation: RETIRED  Tobacco Use   Smoking status: Never   Smokeless tobacco: Never  Vaping Use   Vaping Use: Never used  Substance and Sexual Activity   Alcohol use: No   Drug use: No   Sexual activity: Yes  Other Topics Concern   Not on file  Social History Narrative   Lives with her  husband.   Enjoys playing golf.   Still very active and independent   Social Determinants of Health   Financial Resource Strain: Low Risk    Difficulty of Paying Living Expenses: Not hard at all  Food Insecurity: No Food Insecurity   Worried About Charity fundraiser in the Last Year: Never true   [place] in the Last Year: Never true  Transportation Needs: No Transportation Needs   Lack of Transportation (Medical): No   Lack of Transportation (Non-Medical): No  Physical Activity: Sufficiently Active   Days of Exercise per Week: 2 days   Minutes of Exercise per Session: 90 min  Stress: No Stress Concern Present   Feeling of Stress : Not at all  Social Connections: Moderately Integrated   Frequency of Communication with Friends and Family: More than three times a week   Frequency of Social Gatherings with Friends and Family: Twice a week   Attends Religious Services: More than 4 times per year   Active Member of [employer] or Organizations: No   Attends Music therapist: Never   Marital Status: Married  Human resources officer Violence: Not At Risk   Fear of Current or Ex-Partner: No   Emotionally Abused: No   Physically Abused: No   Sexually Abused: No   Family History  Problem Relation Age of Onset   Stroke Mother        cerebral hemorrhage   Heart disease Father        MI   Heart attack Father    Vision loss Father    Heart disease Brother    Heart attack Brother    Cancer Maternal Aunt        breast   Cancer Brother    Heart disease Brother    Cancer Daughter        breast   Heart disease Son    Colon cancer Neg Hx    Colon polyps Neg Hx    Rectal cancer Neg Hx    Stomach cancer Neg Hx     Objective: Office vital signs reviewed. BP 131/69   Pulse 85   Temp 97.9 F (36.6 C)   Ht 5' (1.524 m)   Wt 119 lb 6.4 oz (54.2 kg)   SpO2 96%   BMI 23.32 kg/m   Physical Examination:  General: Awake, alert, well nourished, No acute distress HEENT:  Sclera white.  Moist mucous membranes Cardio: irregularly irregular with rate controlled, S1S2 heard, no murmurs appreciated Pulm: clear to auscultation bilaterally, no wheezes, rhonchi or rales; normal work of breathing on room air Extremities:  warm, well perfused, No edema, cyanosis or clubbing; +2 pulses bilaterally MSK: ambulating independently.  Assessment/ Plan: 88 y.o. female   Atrial fibrillation, persistent ([place]) - Plan: Magnesium  Essential hypertension - Plan: Renal Function Panel  Stage 3a chronic kidney disease ([place]) - Plan: Renal Function Panel  Inguinal hernia of left side with obstruction and without gangrene  Nausea - Plan: ondansetron (ZOFRAN-ODT) 4 MG disintegrating tablet  Acquired hypothyroidism - Plan: TSH, T4, Free  Rate controlled.  Check mag level.  She was considering starting a mag supplement  Blood pressure well controlled.  No changes  Check renal function given CKD 3A  Hernia is chronic and stable at this point and she will be expecting some surgical intervention soon  Rare nausea.  ODT Zofran sent.  Discussed sparing use as this can precipitate prolonged QTc and exacerbate A-fib  Asymptomatic from a thyroid standpoint.  Check thyroid levels  No orders of the defined types were placed in this encounter.  No orders of the defined types were placed in this encounter.    Susan Norlander, DO [place] [phone]

## 2022-04-06 ENCOUNTER — Other Ambulatory Visit: Payer: Self-pay | Admitting: Family Medicine

## 2022-04-06 LAB — MAGNESIUM: Magnesium: 1.8 mg/dL (ref 1.6–2.3)

## 2022-04-06 LAB — T4, FREE: Free T4: 2.01 ng/dL — ABNORMAL HIGH (ref 0.82–1.77)

## 2022-04-06 LAB — RENAL FUNCTION PANEL
Albumin: 4 g/dL (ref 3.6–4.6)
BUN/Creatinine Ratio: 22 (ref 12–28)
BUN: 23 mg/dL (ref 8–27)
CO2: 26 mmol/L (ref 20–29)
Calcium: 9.1 mg/dL (ref 8.7–10.3)
Chloride: 102 mmol/L (ref 96–106)
Creatinine, Ser: 1.03 mg/dL — ABNORMAL HIGH (ref 0.57–1.00)
Glucose: 80 mg/dL (ref 70–99)
Phosphorus: 3.1 mg/dL (ref 3.0–4.3)
Potassium: 4.2 mmol/L (ref 3.5–5.2)
Sodium: 144 mmol/L (ref 134–144)
eGFR: 52 mL/min/{1.73_m2} — ABNORMAL LOW (ref >59)

## 2022-04-06 LAB — TSH: TSH: 1.22 u[IU]/mL (ref 0.450–4.500)

## 2022-04-08 ENCOUNTER — Telehealth: Payer: Self-pay | Admitting: Family Medicine

## 2022-04-08 DIAGNOSIS — E039 Hypothyroidism, unspecified: Secondary | ICD-10-CM

## 2022-04-08 NOTE — Telephone Encounter (Signed)
Pt called and labs explained

## 2022-04-10 ENCOUNTER — Ambulatory Visit: Payer: Medicare Other

## 2022-04-16 ENCOUNTER — Ambulatory Visit (INDEPENDENT_AMBULATORY_CARE_PROVIDER_SITE_OTHER): Payer: Medicare Other

## 2022-04-16 ENCOUNTER — Ambulatory Visit (INDEPENDENT_AMBULATORY_CARE_PROVIDER_SITE_OTHER): Payer: Medicare Other | Admitting: Family Medicine

## 2022-04-16 ENCOUNTER — Encounter: Payer: Self-pay | Admitting: Family Medicine

## 2022-04-16 VITALS — BP 137/72 | HR 97 | Temp 97.6°F | Ht 60.0 in | Wt 119.2 lb

## 2022-04-16 DIAGNOSIS — M48062 Spinal stenosis, lumbar region with neurogenic claudication: Secondary | ICD-10-CM

## 2022-04-16 DIAGNOSIS — M545 Low back pain, unspecified: Secondary | ICD-10-CM | POA: Diagnosis not present

## 2022-04-16 MED ORDER — BETAMETHASONE SOD PHOS & ACET 6 (3-3) MG/ML IJ SUSP
6.0000 mg | Freq: Once | INTRAMUSCULAR | Status: AC
  Administered 2022-04-16: 6 mg via INTRAMUSCULAR

## 2022-04-16 MED ORDER — TIZANIDINE HCL 4 MG PO TABS: 4.0000 mg | ORAL_TABLET | Freq: Four times a day (QID) | ORAL | 1 refills | Status: DC | PRN

## 2022-04-16 NOTE — Patient Instructions (Signed)

## 2022-04-16 NOTE — Progress Notes (Signed)
Subjective:  Patient ID: Susan Fitzgerald, female    DOB: 04/03/1932  Age: 88 y.o. MRN: [id number]  CC: Back Pain (Left lower x 4 days)   HPI Susan Fitzgerald presents for onset of low back pain few days after golfing.  Now and wants to get back to as soon as possible.  She does not remember any injury.  She was actually okay the following day.  However the second day after her most recent golf outing she woke up with a lot of pain.  Pain was radiating from the left lower back down the left lateral thigh and the leg.  It is was made worse by weightbearing.  Also by twisting at the waist.  Pain is rated as moderately severe.  Applying ice and heat with no relief.      04/16/2022   10:40 AM 04/05/2022   10:26 AM 03/06/2022    8:05 AM  Depression screen PHQ 2/9  Decreased Interest 0 0 0  Down, Depressed, Hopeless 0 0 0  PHQ - 2 Score 0 0 0  Altered sleeping  1 2  Tired, decreased energy  1 1  Change in appetite  1 1  Feeling bad or failure about yourself   0 0  Trouble concentrating  0 0  Moving slowly or fidgety/restless  0 0  Suicidal thoughts  0 0  PHQ-9 Score  3 4  Difficult doing work/chores  Not difficult at all Somewhat difficult    History Susan Fitzgerald has a past medical history of Calcification of bronchial airway (06/23/2018), Cataract, Cerebral vascular disease (06/19/2018), Diverticulosis, Hypertension, Osteoporosis, PAF (paroxysmal atrial fibrillation) ([place]), Pelvic fracture ([place]), Pulmonary fibrosis ([place]) (2018), and Rheumatoid arthritis ([place]) (2017).   She has a past surgical history that includes Cataract extraction; Biopsy breast; Cardioversion (N/A, 10/30/2016); TEE without cardioversion (N/A, 10/30/2016); Breast surgery; Biopsy breast; Eye surgery; Total hip arthroplasty (Right, 01/27/2017); Cardioversion (N/A, 08/20/2017); Colonoscopy; and Cardioversion (N/A, 02/02/2020).   Her family history includes Cancer in her brother, daughter, and maternal aunt; Heart attack in her brother  and father; Heart disease in her brother, brother, father, and son; Stroke in her mother; Vision loss in her father.She reports that she has never smoked. She has never used smokeless tobacco. She reports that she does not drink alcohol and does not use drugs.    ROS Review of Systems  Constitutional: Negative.   HENT: Negative.    Eyes:  Negative for visual disturbance.  Respiratory:  Negative for shortness of breath.   Cardiovascular:  Negative for chest pain.  Gastrointestinal:  Negative for abdominal pain.  Musculoskeletal:  Negative for arthralgias.   Objective:  BP 137/72   Pulse 97   Temp 97.6 F (36.4 C)   Ht 5' (1.524 m)   Wt 119 lb 3.2 oz (54.1 kg)   SpO2 95%   BMI 23.28 kg/m   BP Readings from Last 3 Encounters:  04/16/22 137/72  04/05/22 131/69  03/06/22 110/64    Wt Readings from Last 3 Encounters:  04/16/22 119 lb 3.2 oz (54.1 kg)  04/05/22 119 lb 6.4 oz (54.2 kg)  03/06/22 122 lb (55.3 kg)     Physical Exam Constitutional:      Appearance: She is well-developed.  HENT:     Head: Normocephalic.  Cardiovascular:     Rate and Rhythm: Normal rate and regular rhythm.     Heart sounds: No murmur heard. Pulmonary:     Effort: Pulmonary effort is normal.  Breath sounds: Normal breath sounds.  Musculoskeletal:        General: Tenderness (At the lateral left thigh and at the left L5 musculature) present.  Neurological:     General: No focal deficit present.      Assessment & Plan:   Susan Fitzgerald was seen today for back pain.  Diagnoses and all orders for this visit:  Spinal stenosis of lumbar region with neurogenic claudication -     betamethasone acetate-betamethasone sodium phosphate (CELESTONE) injection 6 mg -     DG Lumbar Spine 2-3 Views; Future  Other orders -     tiZANidine (ZANAFLEX) 4 MG tablet; Take 1 tablet (4 mg total) by mouth every 6 (six) hours as needed for muscle spasms.       I am having Susan Fitzgerald start on  tiZANidine. I am also having her maintain her multivitamin, calcium-vitamin D, gabapentin, melatonin, azaTHIOprine, Probiotic Product (PROBIOTIC-10 ULTIMATE PO), apixaban, lisinopril-hydrochlorothiazide, lisinopril, diltiazem, ketoconazole, levothyroxine, Esbriet, ondansetron, potassium chloride SA, and furosemide. We administered betamethasone acetate-betamethasone sodium phosphate.  Allergies as of 04/16/2022       Reactions   Benazepril Hcl Cough        Medication List        Accurate as of Apr 16, 2022  5:08 PM. If you have any questions, ask your nurse or doctor.          apixaban 2.5 MG Tabs tablet Commonly known as: Eliquis Take 1 tablet (2.5 mg total) by mouth 2 (two) times daily.   azaTHIOprine 50 MG tablet Commonly known as: IMURAN Take 50 mg by mouth daily.   calcium-vitamin D 250-125 MG-UNIT tablet Commonly known as: OSCAL WITH D Take 1 tablet by mouth 2 (two) times daily.   diltiazem 300 MG 24 hr capsule Commonly known as: CARDIZEM CD TAKE 1 CAPSULE BY MOUTH  DAILY   Esbriet 267 MG Tabs Generic drug: Pirfenidone Take 3 tablets (801 mg total) by mouth with breakfast, with lunch, and with evening meal.   furosemide 20 MG tablet Commonly known as: LASIX TAKE 1 TO 2 TABLETS BY  MOUTH DAILY AS NEEDED   gabapentin 100 MG capsule Commonly known as: NEURONTIN Take 100 mg by mouth at bedtime.   ketoconazole 2 % shampoo Commonly known as: NIZORAL Apply topically once a week.   levothyroxine 75 MCG tablet Commonly known as: SYNTHROID TAKE 1 TABLET BY MOUTH  DAILY   lisinopril 20 MG tablet Commonly known as: ZESTRIL TAKE 1 TABLET BY MOUTH  DAILY   lisinopril-hydrochlorothiazide 20-12.5 MG tablet Commonly known as: ZESTORETIC TAKE 1 TABLET BY MOUTH  DAILY IN THE MORNING   melatonin 5 MG Tabs Take 5 mg by mouth at bedtime.   multivitamin capsule Take 1 capsule by mouth daily.   ondansetron 4 MG disintegrating tablet Commonly known as:  ZOFRAN-ODT Take 1 tablet (4 mg total) by mouth every 8 (eight) hours as needed for nausea or vomiting.   potassium chloride SA 20 MEQ tablet Commonly known as: KLOR-CON M TAKE 1 TABLET BY MOUTH DAILY   PROBIOTIC-10 ULTIMATE PO Take 1 tablet by mouth daily.   tiZANidine 4 MG tablet Commonly known as: ZANAFLEX Take 1 tablet (4 mg total) by mouth every 6 (six) hours as needed for muscle spasms. Started by: Claretta Fraise, MD         Follow-up: Return if symptoms worsen or fail to improve.  Claretta Fraise, M.D.

## 2022-04-16 NOTE — Progress Notes (Unsigned)
Cardiology Office Note   Date:  04/17/2022   ID:  Tsuruko, Murtha [birth year], MRN [id number]  PCP:  [name], DO  Cardiologist:   Minus Breeding, MD   Chief Complaint  Patient presents with   Shortness of Breath    History of Present Illness: Susan Fitzgerald is a 88 y.o. female who presents for evaluation of atrial fibrillation. She was cardioverted.  However, she developed recurrent atrial fib.  She failed flecainide.    She has been followed in the atrial fib clinic.  She did describe chest pain and was sent for [facility] which did not suggest ischemia.  She does have a mildly reduced EF (40 - 45% on echo.) Her last echo in March 2019 demonstrated an improved EF of 55 - 60%.    She was started on amiodarone and underwent DCCV. She had increased dyspnea and decreased O2 sats.  She was treated for pneumonia.  She was referred to pulmonary.  A CT was done.  There was a thought that she might have an amiodarone pulmonary process.  Other processes could not be excluded such as a reaction to her methotrexate.  There was also suggestion of pulmonary edema.  She has been treated with diuresis.   She did have an increased creat but this is improved.   She was treated with steroids and did improve.    She has not been told that she has idiopathic idiopathic pulmonary fibrosis.  She had cardioversion again in March 2020 which she probably only helped sinus rhythm for about 4 weeks.  She does not really notice she felt remarkably different during that time.    Since I last saw her she had an echo with mildly elevated pulmonary pressures and low normal EF not changed from previous.   She did see her pulmonologist not long ago and had a BNP drawn which was elevated above thousand which is why we ordered the echo.  She did not think her breathing was particularly different.  She says she has had some chronic dyspnea but she is not describing new PND or orthopnea.  She has had no  new swelling.  She still gets around and likes to golf every week.  She is actually here for preoperative left hernia inguinal repair   Past Medical History:  Diagnosis Date   Calcification of bronchial airway 06/23/2018   01/2017 - See on CT imaging of the chest    Cataract    Cerebral vascular disease 06/19/2018   06/19/2018 - MRI with chronic microvascular ischemic change   Diverticulosis    Hypertension    Osteoporosis    PAF (paroxysmal atrial fibrillation) ([place])    a. s/p prior DCCV; b. On flecainide & coumadin (CHA2DS2VASc = 4);  c. 03/2016 Echo: EF 55-60%, mod LVH, mod AI, mild to mod TR, PASP 87mHg.   Pelvic fracture (HCC)    [place]   [place] [place]    Past Surgical History:  Procedure Laterality Date   BIOPSY BREAST     BIOPSY BREAST     right & benign   BREAST SURGERY     CARDIOVERSION N/A 10/30/2016   Procedure: CARDIOVERSION;  Surgeon: DLarey Dresser MD;  [place]  Service: Cardiovascular;  Laterality: N/A;   CARDIOVERSION N/A 08/20/2017   Procedure: CARDIOVERSION;  Surgeon: SJerline Pain MD;  [place]  Service: Cardiovascular;  Laterality: N/A;  CARDIOVERSION N/A 02/02/2020   Procedure: CARDIOVERSION;  Surgeon: Geralynn Rile, MD;  Location: [place];  Service: Cardiovascular;  Laterality: N/A;   CATARACT EXTRACTION     COLONOSCOPY     EYE SURGERY     cataracts   TEE WITHOUT CARDIOVERSION N/A 10/30/2016   Procedure: TRANSESOPHAGEAL ECHOCARDIOGRAM (TEE);  Surgeon: Larey Dresser, MD;  Location: [place];  Service: Cardiovascular;  Laterality: N/A;   TOTAL HIP ARTHROPLASTY Right 01/27/2017   Procedure: TOTAL HIP ARTHROPLASTY ANTERIOR APPROACH;  Surgeon: Rod Can, MD;  Location: [place];  Service: Orthopedics;  Laterality: Right;     Current Outpatient Medications  Medication Sig Dispense Refill   apixaban (ELIQUIS) 2.5 MG TABS tablet Take 1 tablet (2.5 mg total) by mouth 2  (two) times daily. 180 tablet 3   azaTHIOprine (IMURAN) 50 MG tablet Take 50 mg by mouth daily.      calcium-vitamin D (OSCAL WITH D) 250-125 MG-UNIT per tablet Take 1 tablet by mouth 2 (two) times daily.     diltiazem (CARDIZEM CD) 300 MG 24 hr capsule TAKE 1 CAPSULE BY MOUTH  DAILY 90 capsule 3   ESBRIET 267 MG TABS Take 3 tablets (801 mg total) by mouth with breakfast, with lunch, and with evening meal. 270 tablet 5   furosemide (LASIX) 20 MG tablet TAKE 1 TO 2 TABLETS BY  MOUTH DAILY AS NEEDED 180 tablet 1   gabapentin (NEURONTIN) 100 MG capsule Take 100 mg by mouth at bedtime.      ketoconazole (NIZORAL) 2 % shampoo Apply topically once a week.     levothyroxine (SYNTHROID) 75 MCG tablet TAKE 1 TABLET BY MOUTH  DAILY 90 tablet 2   lisinopril (ZESTRIL) 20 MG tablet TAKE 1 TABLET BY MOUTH  DAILY 90 tablet 3   lisinopril-hydrochlorothiazide (ZESTORETIC) 20-12.5 MG tablet TAKE 1 TABLET BY MOUTH  DAILY IN THE MORNING 90 tablet 3   Melatonin 5 MG TABS Take 5 mg by mouth at bedtime.     Multiple Vitamin (MULTIVITAMIN) capsule Take 1 capsule by mouth daily.     ondansetron (ZOFRAN-ODT) 4 MG disintegrating tablet Take 1 tablet (4 mg total) by mouth every 8 (eight) hours as needed for nausea or vomiting. 20 tablet 1   potassium chloride SA (KLOR-CON M) 20 MEQ tablet TAKE 1 TABLET BY MOUTH DAILY 90 tablet 1   Probiotic Product (PROBIOTIC-10 ULTIMATE PO) Take 1 tablet by mouth daily.     tiZANidine (ZANAFLEX) 4 MG tablet Take 1 tablet (4 mg total) by mouth every 6 (six) hours as needed for muscle spasms. 30 tablet 1   No current facility-administered medications for this visit.    Allergies:   Benazepril hcl    ROS:  Please see the history of present illness.   Otherwise, review of systems are positive for none.   All other systems are reviewed and negative.    PHYSICAL EXAM: VS:  BP 126/60 (BP Location: Left Arm, Patient Position: Sitting, Cuff Size: Normal)   Pulse 97   Ht 5' (1.524 m)   Wt  119 lb (54 kg)   BMI 23.24 kg/m  , BMI Body mass index is 23.24 kg/m. GENERAL:  Well appearing NECK:  No jugular venous distention, waveform within normal limits, carotid upstroke brisk and symmetric, no bruits, no thyromegaly LUNGS:  Clear to auscultation bilaterally CHEST:  Unremarkable HEART:  PMI not displaced or sustained,S1 and S2 within normal limits, no S3,  no clicks, no rubs, no murmurs, irregular ABD:  Flat, positive bowel sounds normal in frequency in pitch, no bruits, no rebound, no guarding, no midline pulsatile mass, no hepatomegaly, no splenomegaly EXT:  2 plus pulses throughout, no edema, no cyanosis no clubbing   EKG:  EKG is  ordered today. Atrial fibrillation, rate 97, axis within normal limits, intervals within normal limits, no acute ST-T wave changes.  PVCs   Recent Labs: 08/08/2021: Hemoglobin 13.3; Platelets 151 03/06/2022: ALT 10; NT-Pro BNP 2,041 04/05/2022: BUN 23; Creatinine, Ser 1.03; Magnesium 1.8; Potassium 4.2; Sodium 144; TSH 1.220    Lipid Panel    Component Value Date/Time   CHOL 172 09/15/2020 0845   CHOL 131 04/14/2013 0836   TRIG 59 09/15/2020 0845   TRIG 67 09/15/2017 0845   TRIG 69 04/14/2013 0836   HDL 79 09/15/2020 0845   HDL 71 09/15/2017 0845   HDL 53 04/14/2013 0836   CHOLHDL 2.2 09/15/2020 0845   LDLCALC 81 09/15/2020 0845   LDLCALC 64 04/14/2013 0836   LDLDIRECT 98 04/20/2014 1015      Wt Readings from Last 3 Encounters:  04/17/22 119 lb (54 kg)  04/16/22 119 lb 3.2 oz (54.1 kg)  04/05/22 119 lb 6.4 oz (54.2 kg)      Other studies Reviewed: Additional studies/ records that were reviewed today include: Labs, surgical note Review of the above records demonstrates:   See elsewhere  ASSESSMENT AND PLAN:  HTN:    Her BP is controlled.  No change in therapy.   ATRIAL FIB:   Susan Fitzgerald has a CHA2DS2 - VASc score of 4.   She is having no new symptoms.  No change in therapy.  She tolerates anticoagulation.  DYSPNEA:  Her dyspnea is baseline and she does not think particularly limiting.  Despite the elevated BNP I do not suspect excess volume.  I am not going to make any changes and she can take her Lasix as needed if she has any increasing shortness of breath.  The echo was as above.    PREOP: The patient might have femoral hernia repair.  She is not high risk from a cardiovascular standpoint as she has good functional level, has no high risk features or findings and this is not a high risk procedure.  However, she would be at risk for excess volume if this is not watched closely in the course she has atrial fibrillation and her rate will need to be managed.  She could come off the anticoagulation as needed for 2 days prior.  Current medicines are reviewed at length with the patient today.  The patient does not have concerns regarding medicines.  The following changes have been made: None  Labs/ tests ordered today include:   None  Orders Placed This Encounter  Procedures   EKG 12-Lead      Disposition:   FU with me in 6 months.     Signed, Minus Breeding, MD  04/17/2022 9:46 AM    [facility]

## 2022-04-17 ENCOUNTER — Telehealth: Payer: Self-pay | Admitting: Pulmonary Disease

## 2022-04-17 ENCOUNTER — Encounter: Payer: Self-pay | Admitting: Cardiology

## 2022-04-17 ENCOUNTER — Ambulatory Visit: Payer: Medicare Other | Admitting: Cardiology

## 2022-04-17 ENCOUNTER — Encounter: Payer: Self-pay | Admitting: Family Medicine

## 2022-04-17 VITALS — BP 126/60 | HR 97 | Ht 60.0 in | Wt 119.0 lb

## 2022-04-17 DIAGNOSIS — R0602 Shortness of breath: Secondary | ICD-10-CM | POA: Diagnosis not present

## 2022-04-17 DIAGNOSIS — I10 Essential (primary) hypertension: Secondary | ICD-10-CM | POA: Diagnosis not present

## 2022-04-17 DIAGNOSIS — I4819 Other persistent atrial fibrillation: Secondary | ICD-10-CM | POA: Diagnosis not present

## 2022-04-17 NOTE — Patient Instructions (Signed)
Medication Instructions:  The current medical regimen is effective;  continue present plan and medications.  *If you need a refill on your cardiac medications before your next appointment, please call your pharmacy*   Follow-Up: At [facility], you and your health needs are our priority.  As part of our continuing mission to provide you with exceptional heart care, we have created designated Provider Care Teams.  These Care Teams include your primary Cardiologist (physician) and Advanced Practice Providers (APPs -  Physician Assistants and Nurse Practitioners) who all work together to provide you with the care you need, when you need it.  We recommend signing up for the patient portal called "MyChart".  Sign up information is provided on this After Visit Summary.  MyChart is used to connect with patients for Virtual Visits (Telemedicine).  Patients are able to view lab/test results, encounter notes, upcoming appointments, etc.  Non-urgent messages can be sent to your provider as well.   To learn more about what you can do with MyChart, go to [email or web address].    Your next appointment:   6 month(s)  The format for your next appointment:   In Person  Provider:   Minus Breeding, MD [facility])

## 2022-04-18 NOTE — Telephone Encounter (Signed)
Please see surgical clearance note for further info. Closing this encounter.

## 2022-04-18 NOTE — Telephone Encounter (Signed)
OV notes and clearance form have been faxed back to [facility]. Nothing further needed at this time.  

## 2022-04-22 ENCOUNTER — Other Ambulatory Visit: Payer: Medicare Other

## 2022-04-22 ENCOUNTER — Telehealth: Payer: Self-pay | Admitting: Family Medicine

## 2022-04-22 DIAGNOSIS — I4819 Other persistent atrial fibrillation: Secondary | ICD-10-CM

## 2022-04-22 NOTE — Telephone Encounter (Signed)
Pt asked [place] order labs, cbc and cmp for dr. Leigh Aurora so she can get  a refill on her medication

## 2022-04-22 NOTE — Telephone Encounter (Signed)
Patient needs to discuss last labs that were done to find out if she needs to get labs done for another office. Please call back.

## 2022-04-23 LAB — CMP14+EGFR
ALT: 13 IU/L (ref 0–32)
AST: 19 IU/L (ref 0–40)
Albumin/Globulin Ratio: 1.5 (ref 1.2–2.2)
Albumin: 3.9 g/dL (ref 3.6–4.6)
Alkaline Phosphatase: 78 IU/L (ref 44–121)
BUN/Creatinine Ratio: 23 (ref 12–28)
BUN: 25 mg/dL (ref 8–27)
Bilirubin Total: 0.3 mg/dL (ref 0.0–1.2)
CO2: 31 mmol/L — ABNORMAL HIGH (ref 20–29)
Calcium: 9.5 mg/dL (ref 8.7–10.3)
Chloride: 99 mmol/L (ref 96–106)
Creatinine, Ser: 1.11 mg/dL — ABNORMAL HIGH (ref 0.57–1.00)
Globulin, Total: 2.6 g/dL (ref 1.5–4.5)
Glucose: 103 mg/dL — ABNORMAL HIGH (ref 70–99)
Potassium: 3.8 mmol/L (ref 3.5–5.2)
Sodium: 145 mmol/L — ABNORMAL HIGH (ref 134–144)
Total Protein: 6.5 g/dL (ref 6.0–8.5)
eGFR: 48 mL/min/{1.73_m2} — ABNORMAL LOW (ref >59)

## 2022-04-23 LAB — CBC WITH DIFFERENTIAL/PLATELET
Basophils Absolute: 0 10*3/uL (ref 0.0–0.2)
Basos: 1 %
EOS (ABSOLUTE): 0.1 10*3/uL (ref 0.0–0.4)
Eos: 1 %
Hematocrit: 38.9 % (ref 34.0–46.6)
Hemoglobin: 13.6 g/dL (ref 11.1–15.9)
Immature Grans (Abs): 0 10*3/uL (ref 0.0–0.1)
Immature Granulocytes: 0 %
Lymphocytes Absolute: 1.2 10*3/uL (ref 0.7–3.1)
Lymphs: 19 %
MCH: 33.9 pg — ABNORMAL HIGH (ref 26.6–33.0)
MCHC: 35 g/dL (ref 31.5–35.7)
MCV: 97 fL (ref 79–97)
Monocytes Absolute: 0.6 10*3/uL (ref 0.1–0.9)
Monocytes: 10 %
Neutrophils Absolute: 4.5 10*3/uL (ref 1.4–7.0)
Neutrophils: 69 %
Platelets: 173 10*3/uL (ref 150–450)
RBC: 4.01 x10E6/uL (ref 3.77–5.28)
RDW: 11.8 % (ref 11.7–15.4)
WBC: 6.4 10*3/uL (ref 3.4–10.8)

## 2022-04-24 DIAGNOSIS — C44722 Squamous cell carcinoma of skin of right lower limb, including hip: Secondary | ICD-10-CM | POA: Diagnosis not present

## 2022-04-24 DIAGNOSIS — C44622 Squamous cell carcinoma of skin of right upper limb, including shoulder: Secondary | ICD-10-CM | POA: Diagnosis not present

## 2022-05-08 ENCOUNTER — Ambulatory Visit: Payer: Self-pay | Admitting: General Surgery

## 2022-05-08 NOTE — H&P (Signed)
Chief Complaint: New Patient (Left inguinal hernia )       History of Present Illness: Susan Fitzgerald is a 88 y.o. female who is seen today as an office consultation at the request of Dr. Lajuana Ripple for evaluation of New Patient (Left inguinal hernia ) .   Patient is an 88 year old female, with a history of hypertension, hypothyroidism, pulmonary fibrosis, due to arthritis, who comes in with a left inguinal hernia.  Patient states that a small bulge has been there for approximately 2 months.  Patient states that she has not noticed any pain however has noticed is gotten larger.  Patient's had no signs of obstruction to the area.  Patient is very active and plays golf twice a week.   Patient's had no previous abdominal surgery   Patient sees Dr. Kimber Relic with pulmonology as well as Dr. Virginia Rochester her cardiologist.  Patient currently is on Eliquis.     Review of Systems: A complete review of systems was obtained from the patient.  I have reviewed this information and discussed as appropriate with the patient.  See HPI as well for other ROS.   Review of Systems Constitutional: Negative for fever. HENT: Negative for congestion.   Eyes: Negative for blurred vision. Respiratory: Negative for cough, shortness of breath and wheezing.   Cardiovascular: Negative for chest pain and palpitations. Gastrointestinal: Negative for heartburn. Genitourinary: Negative for dysuria. Musculoskeletal: Negative for myalgias. Skin: Negative for rash. Neurological: Negative for dizziness and headaches. Psychiatric/Behavioral: Negative for depression and suicidal ideas.  All other systems reviewed and are negative.       Medical History: Past Medical History Past Medical History: Diagnosis Date  Arthritis    Hypertension    Pulmonary hypertension (CMS-HCC)    Thyroid disease        There is no problem list on file for this patient.     Past Surgical History Past Surgical  History: Procedure Laterality Date  BREAST EXCISIONAL BIOPSY Left        Allergies Allergies Allergen Reactions  Benazepril Hcl Cough      Current Outpatient Medications on File Prior to Visit Medication Sig Dispense Refill  acetaminophen (TYLENOL) 500 mg capsule 1 capsule as needed      azaTHIOprine (IMURAN) 50 mg tablet Take 1 tablet by mouth once daily      FUROsemide (LASIX) 20 MG tablet        gabapentin (NEURONTIN) 100 MG capsule TAKE 1 TO 3 CAPSULES BY  MOUTH ONCE DAILY      levothyroxine (SYNTHROID) 75 MCG tablet 1 tablet on an empty stomach in the morning      lisinopriL (ZESTRIL) 20 MG tablet 1 tablet      melatonin 5 mg Tab Take 5 mg by mouth at bedtime      multivitamin capsule Take 1 capsule by mouth once daily      potassium chloride (KLOR-CON) 20 MEQ ER tablet        Saccharomyces boulardii (FLORASTOR) 250 mg capsule 1 capsule       No current facility-administered medications on file prior to visit.     Family History Family History Problem Relation Age of Onset  High blood pressure (Hypertension) Mother    Coronary Artery Disease (Blocked arteries around heart) Father    High blood pressure (Hypertension) Brother    Coronary Artery Disease (Blocked arteries around heart) Brother    Colon cancer Brother        Social History   Tobacco Use  Smoking Status Never Smokeless Tobacco Never     Social History Social History    Socioeconomic History  Marital status: Unknown Tobacco Use  Smoking status: Never  Smokeless tobacco: Never Substance and Sexual Activity  Alcohol use: Never  Drug use: Never      Objective:     Vitals:   03/29/22 1005 BP: 132/80 Pulse: 74 Temp: 36.4 C (97.6 F) SpO2: 96% Weight: 54.5 kg (120 lb 3.2 oz) Height: 152.4 cm (5')   Body mass index is 23.47 kg/m. Physical Exam Constitutional:      Appearance: Normal appearance.  HENT:     Head: Normocephalic and atraumatic.     Mouth/Throat:     Mouth: Mucous  membranes are moist.     Pharynx: Oropharynx is clear.  Eyes:     General: No scleral icterus.    Pupils: Pupils are equal, round, and reactive to light.  Cardiovascular:     Rate and Rhythm: Normal rate and regular rhythm.     Pulses: Normal pulses.     Heart sounds: No murmur heard.   No friction rub. No gallop.  Pulmonary:     Effort: Pulmonary effort is normal. No respiratory distress.     Breath sounds: Normal breath sounds. No stridor.  Abdominal:     General: Abdomen is flat.     Hernia: A hernia is present. Hernia is present in the left inguinal area.  Musculoskeletal:        General: No swelling.  Skin:    General: Skin is warm.  Neurological:     General: No focal deficit present.     Mental Status: She is alert and oriented to person, place, and time. Mental status is at baseline.  Psychiatric:        Mood and Affect: Mood normal.        Thought Content: Thought content normal.        Judgment: Judgment normal.          Assessment and Plan: Diagnoses and all orders for this visit:   Unilateral femoral hernia without obstruction or gangrene, recurrence not specified     Susan Fitzgerald is a 88 y.o. female    We will obtain clearance from her pulmonary physician and cardiologist.  Patient will also need to be off her Eliquis prior to surgery.   1.  We will proceed to the OR for a laparoscopic versus open left femoral hernia repair with mesh. 2. All risks and benefits were discussed with the patient, to generally include infection, bleeding, damage to surrounding structures, acute and chronic nerve pain, and recurrence. Alternatives were offered and described.  All questions were answered and the patient voiced understanding of the procedure and wishes to proceed at this point.             No follow-ups on file.   Ralene Ok, MD, [facility], [facility]

## 2022-05-15 ENCOUNTER — Ambulatory Visit: Payer: Medicare Other | Admitting: Cardiology

## 2022-05-17 DIAGNOSIS — M0579 Rheumatoid arthritis with rheumatoid factor of multiple sites without organ or systems involvement: Secondary | ICD-10-CM | POA: Diagnosis not present

## 2022-05-17 DIAGNOSIS — J849 Interstitial pulmonary disease, unspecified: Secondary | ICD-10-CM | POA: Diagnosis not present

## 2022-05-17 DIAGNOSIS — M5030 Other cervical disc degeneration, unspecified cervical region: Secondary | ICD-10-CM | POA: Diagnosis not present

## 2022-05-17 DIAGNOSIS — Z6821 Body mass index (BMI) 21.0-21.9, adult: Secondary | ICD-10-CM | POA: Diagnosis not present

## 2022-05-17 DIAGNOSIS — R7989 Other specified abnormal findings of blood chemistry: Secondary | ICD-10-CM | POA: Diagnosis not present

## 2022-05-17 DIAGNOSIS — M1991 Primary osteoarthritis, unspecified site: Secondary | ICD-10-CM | POA: Diagnosis not present

## 2022-05-17 DIAGNOSIS — M5136 Other intervertebral disc degeneration, lumbar region: Secondary | ICD-10-CM | POA: Diagnosis not present

## 2022-05-17 DIAGNOSIS — Z79899 Other long term (current) drug therapy: Secondary | ICD-10-CM | POA: Diagnosis not present

## 2022-05-17 DIAGNOSIS — M65331 Trigger finger, right middle finger: Secondary | ICD-10-CM | POA: Diagnosis not present

## 2022-05-29 ENCOUNTER — Ambulatory Visit: Payer: Medicare Other | Admitting: Cardiology

## 2022-06-05 DIAGNOSIS — Z1283 Encounter for screening for malignant neoplasm of skin: Secondary | ICD-10-CM | POA: Diagnosis not present

## 2022-06-05 DIAGNOSIS — Z08 Encounter for follow-up examination after completed treatment for malignant neoplasm: Secondary | ICD-10-CM | POA: Diagnosis not present

## 2022-06-05 DIAGNOSIS — Z85828 Personal history of other malignant neoplasm of skin: Secondary | ICD-10-CM | POA: Diagnosis not present

## 2022-06-05 DIAGNOSIS — D225 Melanocytic nevi of trunk: Secondary | ICD-10-CM | POA: Diagnosis not present

## 2022-06-05 DIAGNOSIS — C44529 Squamous cell carcinoma of skin of other part of trunk: Secondary | ICD-10-CM | POA: Diagnosis not present

## 2022-06-05 DIAGNOSIS — C44622 Squamous cell carcinoma of skin of right upper limb, including shoulder: Secondary | ICD-10-CM | POA: Diagnosis not present

## 2022-07-06 ENCOUNTER — Other Ambulatory Visit: Payer: Self-pay | Admitting: Family Medicine

## 2022-07-17 ENCOUNTER — Encounter (HOSPITAL_COMMUNITY): Payer: Self-pay | Admitting: General Surgery

## 2022-07-17 NOTE — H&P (Signed)
Chief Complaint: New Patient (Left inguinal hernia )       History of Present Illness: Susan Fitzgerald is a 88 y.o. female who is seen today as an office consultation at the request of Dr. Lajuana Ripple for evaluation of New Patient (Left inguinal hernia ) .   Patient is an 88 year old female, with a history of hypertension, hypothyroidism, pulmonary fibrosis, due to arthritis, who comes in with a left inguinal hernia.  Patient states that a small bulge has been there for approximately 2 months.  Patient states that she has not noticed any pain however has noticed is gotten larger.  Patient's had no signs of obstruction to the area.  Patient is very active and plays golf twice a week.   Patient's had no previous abdominal surgery   Patient sees Dr. Kimber Relic with pulmonology as well as Dr. Virginia Rochester her cardiologist.  Patient currently is on Eliquis.     Review of Systems: A complete review of systems was obtained from the patient.  I have reviewed this information and discussed as appropriate with the patient.  See HPI as well for other ROS.   Review of Systems Constitutional: Negative for fever. HENT: Negative for congestion.   Eyes: Negative for blurred vision. Respiratory: Negative for cough, shortness of breath and wheezing.   Cardiovascular: Negative for chest pain and palpitations. Gastrointestinal: Negative for heartburn. Genitourinary: Negative for dysuria. Musculoskeletal: Negative for myalgias. Skin: Negative for rash. Neurological: Negative for dizziness and headaches. Psychiatric/Behavioral: Negative for depression and suicidal ideas.  All other systems reviewed and are negative.       Medical History: Past Medical History Past Medical History: Diagnosis        Date            Arthritis                         Hypertension                Pulmonary hypertension (CMS-HCC)                         Thyroid disease                  There is no problem list on file for  this patient.     Past Surgical History Past Surgical History: Procedure       Laterality         Date            BREAST EXCISIONAL BIOPSY        Left              Allergies Allergies Allergen           Reactions            Benazepril Hcl Cough       Current Outpatient Medications on File Prior to Visit Medication       Sig       Dispense         Refill            acetaminophen (TYLENOL) 500 mg capsule            1 capsule as needed                            azaTHIOprine (IMURAN) 50 mg tablet  Take 1 tablet by mouth once daily                               FUROsemide (LASIX) 20 MG tablet                                       gabapentin (NEURONTIN) 100 MG capsule TAKE 1 TO 3 CAPSULES BY  MOUTH ONCE DAILY                                    levothyroxine (SYNTHROID) 75 MCG tablet 1 tablet on an empty stomach in the morning                                   lisinopriL (ZESTRIL) 20 MG tablet     1 tablet                                     melatonin 5 mg Tab    Take 5 mg by mouth at bedtime                                  multivitamin capsule   Take 1 capsule by mouth once daily                           potassium chloride (KLOR-CON) 20 MEQ ER tablet                                      Saccharomyces boulardii (FLORASTOR) 250 mg capsule   1 capsule                         No current facility-administered medications on file prior to visit.     Family History Family History Problem           Relation           Age of Onset            High blood pressure (Hypertension)   Mother              Coronary Artery Disease (Blocked arteries around heart)     Father               High blood pressure (Hypertension)   Brother                         Coronary Artery Disease (Blocked arteries around heart)     Brother                         Colon cancer   Brother                    Social History   Tobacco Use Smoking Status           Never Smokeless Tobacco  Never      Social History Social History     Socioeconomic History            Marital status:  Unknown Tobacco Use            Smoking status:          Never            Smokeless tobacco:    Never Substance and Sexual Activity            Alcohol use:    Never            Drug use:        Never       Objective:     Vitals:             03/29/22 1005 BP:      132/80 Pulse:  74 Temp:  36.4 C (97.6 F) SpO2:  96% Weight:            54.5 kg (120 lb 3.2 oz) Height: 152.4 cm (5')   Body mass index is 23.47 kg/m. Physical Exam Constitutional:      Appearance: Normal appearance.  HENT:     Head: Normocephalic and atraumatic.     Mouth/Throat:     Mouth: Mucous membranes are moist.     Pharynx: Oropharynx is clear.  Eyes:     General: No scleral icterus.    Pupils: Pupils are equal, round, and reactive to light.  Cardiovascular:     Rate and Rhythm: Normal rate and regular rhythm.     Pulses: Normal pulses.     Heart sounds: No murmur heard.   No friction rub. No gallop.  Pulmonary:     Effort: Pulmonary effort is normal. No respiratory distress.     Breath sounds: Normal breath sounds. No stridor.  Abdominal:     General: Abdomen is flat.     Hernia: A hernia is present. Hernia is present in the left inguinal area.  Musculoskeletal:        General: No swelling.  Skin:    General: Skin is warm.  Neurological:     General: No focal deficit present.     Mental Status: She is alert and oriented to person, place, and time. Mental status is at baseline.  Psychiatric:        Mood and Affect: Mood normal.        Thought Content: Thought content normal.        Judgment: Judgment normal.          Assessment and Plan: Diagnoses and all orders for this visit:   Unilateral femoral hernia without obstruction or gangrene, recurrence not specified     Susan Fitzgerald is a 88 y.o. female    We will obtain clearance from her pulmonary physician and cardiologist.  Patient will also  need to be off her Eliquis prior to surgery.   1.          We will proceed to the OR for a laparoscopic versus open left femoral hernia repair with mesh. 2.         All risks and benefits were discussed with the patient, to generally include infection, bleeding, damage to surrounding structures, acute and chronic nerve pain, and recurrence. Alternatives were offered and described.  All questions were answered and the patient voiced understanding of the procedure and wishes to proceed at this point.

## 2022-07-17 NOTE — Progress Notes (Signed)
PCP - Dr Ronnie Doss  Cardiologist - Dr Minus Breeding  CT Chest x-ray - 09/10/21 EKG - 04/17/22 Stress Test - 07/03/17 ECHO - 04/01/22 Cardiac Cath - n/a  ICD Pacemaker/Loop - n/a  Sleep Study -  n/a CPAP - none  ERAS: Clear liquids til 8 AM DOS  Anesthesia review: Yes   Last dose of Eliquis was on 07/15/22.  STOP now taking any Aspirin (unless otherwise instructed by your surgeon), Aleve, Naproxen, Ibuprofen, Motrin, Advil, Goody's, BC's, all herbal medications, fish oil, and all vitamins.   Coronavirus Screening Do you have any of the following symptoms:  Cough yes/no: No Fever (>100.89F)  yes/no: No Runny nose yes/no: No Sore throat yes/no: No Difficulty breathing/shortness of breath  yes/no: No  Have you traveled in the last 14 days and where? yes/no: No  Patient verbalized understanding of instructions that were given via phone.

## 2022-07-17 NOTE — Anesthesia Preprocedure Evaluation (Signed)
Anesthesia Evaluation  Patient identified by MRN, date of birth, ID band Patient awake    Reviewed: Allergy & Precautions, NPO status , Patient's Chart, lab work & pertinent test results  Airway Mallampati: II  TM Distance: >3 FB Neck ROM: Full    Dental  (+) Dental Advisory Given, Teeth Intact   Pulmonary  Pulmonary fibrosis    Pulmonary exam normal breath sounds clear to auscultation       Cardiovascular hypertension (159/92 in preop), Pt. on medications pulmonary hypertension (mild pHTN)+CHF (RVSP mildly reduced)  + dysrhythmias (eliquis) Atrial Fibrillation + Valvular Problems/Murmurs (mild to mod MR, mild AI, Mod TR) MR and AI  Rhythm:Irregular Rate:Normal + Systolic murmurs Echo 07/1790 1. Left ventricular ejection fraction, by estimation, is 50 to 55%. The left ventricle has low normal function. The left ventricle has no regional wall motion abnormalities. Left ventricular diastolic function could not be evaluated.  2. Right ventricular systolic function is mildly reduced. The right ventricular size is mildly enlarged. There is mildly elevated pulmonary artery systolic pressure. The estimated right ventricular systolic pressure is 50.5 mmHg.  3. Left atrial size was severely dilated.  4. Right atrial size was severely dilated.  5. The mitral valve is degenerative. Mild to moderate mitral valve regurgitation. No evidence of mitral stenosis.  6. Tricuspid valve regurgitation is moderate.  7. The aortic valve is tricuspid. There is moderate calcification of the aortic valve. There is moderate thickening of the aortic valve. Aortic valve regurgitation is mild. Aortic valve sclerosis/calcification is present, without any evidence of aortic stenosis.  8. The inferior vena cava is dilated in size with <50% respiratory variability, suggesting right atrial pressure of 15 mmHg.    Neuro/Psych PSYCHIATRIC DISORDERS negative  neurological ROS     GI/Hepatic negative GI ROS, Neg liver ROS,   Endo/Other  Hypothyroidism   Renal/GU Renal InsufficiencyRenal diseaseCr 1.11     Musculoskeletal  (+) Arthritis , Osteoarthritis and Rheumatoid disorders,    Abdominal   Peds  Hematology negative hematology ROS (+)   Anesthesia Other Findings   Reproductive/Obstetrics                           Anesthesia Physical Anesthesia Plan  ASA: 4  Anesthesia Plan: General   Post-op Pain Management: Tylenol PO (pre-op)*   Induction: Intravenous  PONV Risk Score and Plan: 4 or greater and Ondansetron, Treatment may vary due to age or medical condition and Dexamethasone  Airway Management Planned:   Additional Equipment: ClearSight  Intra-op Plan:   Post-operative Plan: Extubation in OR  Informed Consent: I have reviewed the patients History and Physical, chart, labs and discussed the procedure including the risks, benefits and alternatives for the proposed anesthesia with the patient or authorized representative who has indicated his/her understanding and acceptance.     Dental advisory given  Plan Discussed with: CRNA  Anesthesia Plan Comments: (PAT note by Karoline Caldwell, PA-C:  Follows with cardiology for history of recurrent atrial fibrillation.  She has failed multiple cardioversions, most recently March 2020.  She is anticoagulated on Eliquis.  Most recent echo 04/01/2022 showed EF 50 to 55%, mildly elevated pulmonary pressures and RV dysfunction, mild to moderate mitral regurgitation, mild aortic regurgitation, mild tricuspid regurgitation.  Last seen by Dr. Percival Spanish 04/17/2022 for preop evaluation.  Per note, "PREOP:The patient might have femoral hernia repair. She is not high risk from a cardiovascular standpoint as she has good functional level, has no  high risk features or findings and this is not a high risk procedure. However, she would be at risk for excess volume if this is  not watched closely in the course she has atrial fibrillation and her rate will need to be managed. 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  Her symptoms were noted to be stable on medications and she has good functional capacity.  Last PFT showed mild diffusion impairment and preserved lung volumes.  Dr. [name] Browner commented on surgical risk from pulmonary standpoint in note 04/05/2022, "PerARISCAT ([place]) preoperative pulmonary risk index in adultsshe has Low risk: 8.2% pulmonary complication rate. Peri-operative Assessment of Pulmonary Risk for Non-Thoracic Surgery: ForMs.[place]ater, risk of perioperative pulmonary complications is increased by: Age greater than 21 years, interstitial lung disease. Respiratory complications generally occur in 1% of ASA Class I patients, 5% of ASA Class II and 10% of ASA Class III-IV patients These complications rarely result in mortalityand iclude postoperative pneumonia, atelectasis, pulmonary embolism, ARDS and increased time requiring postoperative mechanical ventilation. Overall, I recommend proceeding with the surgeryif the risk for respiratory complications are outweighed by the potential benefits. This will need to be discussed between the patient and surgeon. To reduce risksof respiratory complications, I recommend: --Pre- and post-operative incentive spirometry performed frequently while awake --Avoiding use of pancuronium during anesthesia."  Patient will need day of surgery labs and evaluation.  EKG 04/17/2022: Atrial fibrillation.  Rate 97.  Left axis deviation.  Voltage criteria for LVH.  CT Chest 09/10/21: IMPRESSION: Fibrotic interstitial lung disease with no evidence of progression when compared with prior exam. Findings are categorized as probable UIP per consensus guidelines: Diagnosis of Idiopathic Pulmonary Fibrosis: An  Official ATS/ERS/JRS/ALAT Clinical Practice Guideline. [place], Iss 5, [phone], Jul 19 2017.  New solid pulmonary nodule of the left lung apex measuring 5 mm. No follow-up needed if patient is low-risk. Non-contrast chest CT can be considered in 12 months if patient is high-risk. This recommendation follows the consensus statement: Guidelines for Management of Incidental Pulmonary Nodules Detected on CT Images: From the Fleischner Society 2017; Radiology 2017; 284:228-243.  Cardiomegaly and coronary artery disease.  Aortic Atherosclerosis (ICD10-I70.0).  TTE 04/01/2022: 1. Left ventricular ejection fraction, by estimation, is 50 to 55%. The  left ventricle has low normal function. The left ventricle has no regional  wall motion abnormalities. Left ventricular diastolic function could not  be evaluated.  2. Right ventricular systolic function is mildly reduced. The right  ventricular size is mildly enlarged. There is mildly elevated pulmonary  artery systolic pressure. The estimated right ventricular systolic  pressure is 34.1 mmHg.  3. Left atrial size was severely dilated.  4. Right atrial size was severely dilated.  5. The mitral valve is degenerative. Mild to moderate mitral valve  regurgitation. No evidence of mitral stenosis.  6. Tricuspid valve regurgitation is moderate.  7. The aortic valve is tricuspid. There is moderate calcification of the  aortic valve. There is moderate thickening of the aortic valve. Aortic  valve regurgitation is mild. Aortic valve sclerosis/calcification is  present, without any evidence of aortic  stenosis.  8. The inferior vena cava is dilated in size with <50% respiratory  variability, suggesting right atrial pressure of 15 mmHg.   Comparison(s): No significant change from prior study.   Nuclear stress 07/03/2017: . Nuclear stress EF: 51%. . There was  no ST segment deviation noted during stress. . The  study is normal. . This is a low risk study. . The left ventricular ejection fraction is mildly decreased (45-54%).  Low risk stress nuclear study with normal perfusion. Mildly reduced left ventricular global systolic function. Due to the irregular rhythm, this may be an erroneous estimation and should be corroborated with echo or other imaging modalities. )      Anesthesia Quick Evaluation

## 2022-07-17 NOTE — Progress Notes (Signed)
Anesthesia Chart Review: Same day workup  Follows with cardiology for history of recurrent atrial fibrillation.  She has failed multiple cardioversions, most recently March 2020.  She is anticoagulated on Eliquis.  Most recent echo 04/01/2022 showed EF 50 to 55%, mildly elevated pulmonary pressures and RV dysfunction, mild to moderate mitral regurgitation, mild aortic regurgitation, mild tricuspid regurgitation.  Last seen by Dr. Percival Spanish 04/17/2022 for preop evaluation.  Per note, "PREOP: The patient might have femoral hernia repair.  She is not high risk from a cardiovascular standpoint as she has good functional level, has no high risk features or findings and this is not a high risk procedure.  However, she would be at risk for excess volume if this is not watched closely in the course she has atrial fibrillation and her rate will need to be managed.  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  Her symptoms were noted to be stable on medications and she has good functional capacity.  Last PFT showed mild diffusion impairment and preserved lung volumes.  Dr. [name] Browner commented on surgical risk from pulmonary standpoint in note 04/05/2022, "Per ARISCAT (Canet) preoperative pulmonary risk index in adults she has Low risk: 4.0% pulmonary complication rate. Peri-operative Assessment of Pulmonary Risk for Non-Thoracic Surgery: ForMs. [place], risk of perioperative pulmonary complications is increased by: Age greater than 37 years, interstitial lung disease. Respiratory complications generally occur in 1% of ASA Class I patients, 5% of ASA Class II and 10% of ASA Class III-IV patients These complications rarely result in mortality and iclude postoperative pneumonia, atelectasis, pulmonary embolism, ARDS and increased time requiring postoperative mechanical ventilation. Overall, I recommend  proceeding with the surgery if the risk for respiratory complications are outweighed by the potential benefits. This will need to be discussed between the patient and surgeon. To reduce risks of respiratory complications, I recommend: --Pre- and post-operative incentive spirometry performed frequently while awake --Avoiding use of pancuronium during anesthesia."  Patient will need day of surgery labs and evaluation.  EKG 04/17/2022: Atrial fibrillation.  Rate 97.  Left axis deviation.  Voltage criteria for LVH.  CT Chest 09/10/21: IMPRESSION: Fibrotic interstitial lung disease with no evidence of progression when compared with prior exam. Findings are categorized as probable UIP per consensus guidelines: Diagnosis of Idiopathic Pulmonary Fibrosis: An Official ATS/ERS/JRS/ALAT Clinical Practice Guideline. [place], Iss 5, [phone], Jul 19 2017.   New solid pulmonary nodule of the left lung apex measuring 5 mm. No follow-up needed if patient is low-risk. Non-contrast chest CT can be considered in 12 months if patient is high-risk. This recommendation follows the consensus statement: Guidelines for Management of Incidental Pulmonary Nodules Detected on CT Images: From the Fleischner Society 2017; Radiology 2017; 284:228-243.   Cardiomegaly and coronary artery disease.   Aortic Atherosclerosis (ICD10-I70.0).  TTE 04/01/2022:  1. Left ventricular ejection fraction, by estimation, is 50 to 55%. The  left ventricle has low normal function. The left ventricle has no regional  wall motion abnormalities. Left ventricular diastolic function could not  be evaluated.   2. Right ventricular systolic function is mildly reduced. The right  ventricular size is mildly enlarged. There is mildly elevated pulmonary  artery systolic pressure. The estimated right ventricular systolic  pressure is 09.3 mmHg.   3. Left atrial size was severely dilated.   4. Right atrial size was  severely  dilated.   5. The mitral valve is degenerative. Mild to moderate mitral valve  regurgitation. No evidence of mitral stenosis.   6. Tricuspid valve regurgitation is moderate.   7. The aortic valve is tricuspid. There is moderate calcification of the  aortic valve. There is moderate thickening of the aortic valve. Aortic  valve regurgitation is mild. Aortic valve sclerosis/calcification is  present, without any evidence of aortic  stenosis.   8. The inferior vena cava is dilated in size with <50% respiratory  variability, suggesting right atrial pressure of 15 mmHg.   Comparison(s): No significant change from prior study.   Nuclear stress 07/03/2017: Nuclear stress EF: 51%. There was no ST segment deviation noted during stress. The study is normal. This is a low risk study. The left ventricular ejection fraction is mildly decreased (45-54%).   Low risk stress nuclear study with normal perfusion. Mildly reduced left ventricular global systolic function. Due to the irregular rhythm, this may be an erroneous estimation and should be corroborated with echo or other imaging modalities.     Wynonia Musty [facility] Short Stay Center/Anesthesiology Phone [phone] 07/17/2022 11:12 AM

## 2022-07-18 ENCOUNTER — Other Ambulatory Visit: Payer: Self-pay

## 2022-07-18 ENCOUNTER — Ambulatory Visit (HOSPITAL_COMMUNITY): Payer: Medicare Other | Admitting: Physician Assistant

## 2022-07-18 ENCOUNTER — Ambulatory Visit (HOSPITAL_COMMUNITY)
Admission: RE | Admit: 2022-07-18 | Discharge: 2022-07-18 | Disposition: A | Discharge status: Home / Self Care | Payer: Medicare Other | Attending: General Surgery | Admitting: General Surgery

## 2022-07-18 ENCOUNTER — Ambulatory Visit (HOSPITAL_BASED_OUTPATIENT_CLINIC_OR_DEPARTMENT_OTHER): Payer: Medicare Other | Admitting: Physician Assistant

## 2022-07-18 ENCOUNTER — Encounter (HOSPITAL_COMMUNITY)
Admission: RE | Disposition: A | Discharge status: Home / Self Care | Payer: Self-pay | Source: Home / Self Care | Attending: General Surgery

## 2022-07-18 DIAGNOSIS — E039 Hypothyroidism, unspecified: Secondary | ICD-10-CM | POA: Diagnosis not present

## 2022-07-18 DIAGNOSIS — K4090 Unilateral inguinal hernia, without obstruction or gangrene, not specified as recurrent: Secondary | ICD-10-CM | POA: Insufficient documentation

## 2022-07-18 DIAGNOSIS — I110 Hypertensive heart disease with heart failure: Secondary | ICD-10-CM | POA: Diagnosis not present

## 2022-07-18 DIAGNOSIS — I10 Essential (primary) hypertension: Secondary | ICD-10-CM | POA: Insufficient documentation

## 2022-07-18 DIAGNOSIS — I509 Heart failure, unspecified: Secondary | ICD-10-CM | POA: Diagnosis not present

## 2022-07-18 DIAGNOSIS — Z7901 Long term (current) use of anticoagulants: Secondary | ICD-10-CM | POA: Insufficient documentation

## 2022-07-18 DIAGNOSIS — J84112 Idiopathic pulmonary fibrosis: Secondary | ICD-10-CM | POA: Diagnosis not present

## 2022-07-18 DIAGNOSIS — I272 Pulmonary hypertension, unspecified: Secondary | ICD-10-CM | POA: Insufficient documentation

## 2022-07-18 DIAGNOSIS — K4190 Unilateral femoral hernia, without obstruction or gangrene, not specified as recurrent: Secondary | ICD-10-CM | POA: Diagnosis not present

## 2022-07-18 DIAGNOSIS — N289 Disorder of kidney and ureter, unspecified: Secondary | ICD-10-CM | POA: Diagnosis not present

## 2022-07-18 DIAGNOSIS — I4891 Unspecified atrial fibrillation: Secondary | ICD-10-CM | POA: Insufficient documentation

## 2022-07-18 HISTORY — PX: INGUINAL HERNIA REPAIR: SHX194

## 2022-07-18 LAB — CBC
HCT: 38.9 % (ref 36.0–46.0)
Hemoglobin: 13.1 g/dL (ref 12.0–15.0)
MCH: 33.8 pg (ref 26.0–34.0)
MCHC: 33.7 g/dL (ref 30.0–36.0)
MCV: 100.3 fL — ABNORMAL HIGH (ref 80.0–100.0)
Platelets: 153 10*3/uL (ref 150–400)
RBC: 3.88 MIL/uL (ref 3.87–5.11)
RDW: 13.5 % (ref 11.5–15.5)
WBC: 4.9 10*3/uL (ref 4.0–10.5)
nRBC: 0 % (ref 0.0–0.2)

## 2022-07-18 LAB — BASIC METABOLIC PANEL
Anion gap: 9 (ref 5–15)
BUN: 25 mg/dL — ABNORMAL HIGH (ref 8–23)
CO2: 29 mmol/L (ref 22–32)
Calcium: 9.6 mg/dL (ref 8.9–10.3)
Chloride: 104 mmol/L (ref 98–111)
Creatinine, Ser: 1.18 mg/dL — ABNORMAL HIGH (ref 0.44–1.00)
GFR, Estimated: 44 mL/min — ABNORMAL LOW (ref >60)
Glucose, Bld: 86 mg/dL (ref 70–99)
Potassium: 3.4 mmol/L — ABNORMAL LOW (ref 3.5–5.1)
Sodium: 142 mmol/L (ref 135–145)

## 2022-07-18 SURGERY — REPAIR, HERNIA, INGUINAL, LAPAROSCOPIC
Anesthesia: General | Laterality: Left

## 2022-07-18 MED ORDER — PHENYLEPHRINE HCL (PRESSORS) 10 MG/ML IV SOLN
INTRAVENOUS | Status: DC | PRN
  Administered 2022-07-18: 80 ug via INTRAVENOUS
  Administered 2022-07-18 (×2): 40 ug via INTRAVENOUS

## 2022-07-18 MED ORDER — OXYCODONE HCL 5 MG PO TABS
ORAL_TABLET | ORAL | Status: AC
  Filled 2022-07-18: qty 1

## 2022-07-18 MED ORDER — 0.9 % SODIUM CHLORIDE (POUR BTL) OPTIME
TOPICAL | Status: DC | PRN
  Administered 2022-07-18: 1000 mL

## 2022-07-18 MED ORDER — MEPERIDINE HCL 25 MG/ML IJ SOLN: 6.2500 mg | INTRAMUSCULAR | Status: DC | PRN

## 2022-07-18 MED ORDER — PROPOFOL 10 MG/ML IV BOLUS
INTRAVENOUS | Status: AC
  Filled 2022-07-18: qty 20

## 2022-07-18 MED ORDER — CHLORHEXIDINE GLUCONATE CLOTH 2 % EX PADS: 6.0000 | MEDICATED_PAD | Freq: Once | CUTANEOUS | Status: DC

## 2022-07-18 MED ORDER — TRAMADOL HCL 50 MG PO TABS: 50.0000 mg | ORAL_TABLET | Freq: Four times a day (QID) | ORAL | 0 refills | Status: DC | PRN

## 2022-07-18 MED ORDER — ONDANSETRON HCL 4 MG/2ML IJ SOLN
INTRAMUSCULAR | Status: AC
  Filled 2022-07-18: qty 2

## 2022-07-18 MED ORDER — AMISULPRIDE (ANTIEMETIC) 5 MG/2ML IV SOLN: 5.0000 mg | Freq: Once | INTRAVENOUS | Status: DC | PRN

## 2022-07-18 MED ORDER — FENTANYL CITRATE (PF) 100 MCG/2ML IJ SOLN: 25.0000 ug | INTRAMUSCULAR | Status: DC | PRN

## 2022-07-18 MED ORDER — LIDOCAINE 2% (20 MG/ML) 5 ML SYRINGE
INTRAMUSCULAR | Status: AC
  Filled 2022-07-18: qty 5

## 2022-07-18 MED ORDER — BUPIVACAINE HCL 0.25 % IJ SOLN
INTRAMUSCULAR | Status: DC | PRN
  Administered 2022-07-18: 8 mL

## 2022-07-18 MED ORDER — CHLORHEXIDINE GLUCONATE 0.12 % MT SOLN
15.0000 mL | Freq: Once | OROMUCOSAL | Status: AC
  Administered 2022-07-18: 15 mL via OROMUCOSAL
  Filled 2022-07-18: qty 15

## 2022-07-18 MED ORDER — ONDANSETRON HCL 4 MG/2ML IJ SOLN
INTRAMUSCULAR | Status: DC | PRN
  Administered 2022-07-18: 4 mg via INTRAVENOUS

## 2022-07-18 MED ORDER — DEXAMETHASONE SODIUM PHOSPHATE 10 MG/ML IJ SOLN
INTRAMUSCULAR | Status: DC | PRN
  Administered 2022-07-18: 5 mg via INTRAVENOUS

## 2022-07-18 MED ORDER — FENTANYL CITRATE (PF) 250 MCG/5ML IJ SOLN
INTRAMUSCULAR | Status: AC
  Filled 2022-07-18: qty 5

## 2022-07-18 MED ORDER — LACTATED RINGERS IV SOLN
INTRAVENOUS | Status: DC
Start: 2022-07-18 — End: 2022-07-18

## 2022-07-18 MED ORDER — PROPOFOL 10 MG/ML IV BOLUS
INTRAVENOUS | Status: DC | PRN
  Administered 2022-07-18: 80 mg via INTRAVENOUS

## 2022-07-18 MED ORDER — FENTANYL CITRATE (PF) 250 MCG/5ML IJ SOLN
INTRAMUSCULAR | Status: DC | PRN
  Administered 2022-07-18: 50 ug via INTRAVENOUS

## 2022-07-18 MED ORDER — OXYCODONE HCL 5 MG PO TABS
2.5000 mg | ORAL_TABLET | Freq: Once | ORAL | Status: AC | PRN
  Administered 2022-07-18: 2.5 mg via ORAL

## 2022-07-18 MED ORDER — CEFAZOLIN SODIUM-DEXTROSE 2-4 GM/100ML-% IV SOLN
2.0000 g | INTRAVENOUS | Status: AC
  Administered 2022-07-18: 2 g via INTRAVENOUS
  Filled 2022-07-18: qty 100

## 2022-07-18 MED ORDER — DEXAMETHASONE SODIUM PHOSPHATE 10 MG/ML IJ SOLN
INTRAMUSCULAR | Status: AC
  Filled 2022-07-18: qty 1

## 2022-07-18 MED ORDER — ACETAMINOPHEN 500 MG PO TABS
1000.0000 mg | ORAL_TABLET | ORAL | Status: AC
  Administered 2022-07-18: 1000 mg via ORAL
  Filled 2022-07-18: qty 2

## 2022-07-18 MED ORDER — ENSURE PRE-SURGERY PO LIQD: 296.0000 mL | Freq: Once | ORAL | Status: DC

## 2022-07-18 MED ORDER — BUPIVACAINE HCL (PF) 0.25 % IJ SOLN
INTRAMUSCULAR | Status: AC
  Filled 2022-07-18: qty 30

## 2022-07-18 MED ORDER — ROCURONIUM BROMIDE 10 MG/ML (PF) SYRINGE
PREFILLED_SYRINGE | INTRAVENOUS | Status: AC
  Filled 2022-07-18: qty 10

## 2022-07-18 MED ORDER — ROCURONIUM BROMIDE 100 MG/10ML IV SOLN
INTRAVENOUS | Status: DC | PRN
  Administered 2022-07-18: 40 mg via INTRAVENOUS

## 2022-07-18 MED ORDER — ORAL CARE MOUTH RINSE: 15.0000 mL | Freq: Once | OROMUCOSAL | Status: AC

## 2022-07-18 MED ORDER — CHLORHEXIDINE GLUCONATE CLOTH 2 % EX PADS
6.0000 | MEDICATED_PAD | Freq: Once | CUTANEOUS | Status: AC
  Administered 2022-07-18: 6 via TOPICAL

## 2022-07-18 MED ORDER — OXYCODONE HCL 5 MG/5ML PO SOLN: 2.5000 mg | Freq: Once | ORAL | Status: AC | PRN

## 2022-07-18 MED ORDER — SUGAMMADEX SODIUM 200 MG/2ML IV SOLN
INTRAVENOUS | Status: DC | PRN
  Administered 2022-07-18: 110 mg via INTRAVENOUS

## 2022-07-18 MED ORDER — PHENYLEPHRINE 80 MCG/ML (10ML) SYRINGE FOR IV PUSH (FOR BLOOD PRESSURE SUPPORT): PREFILLED_SYRINGE | INTRAVENOUS | Status: DC | PRN

## 2022-07-18 MED ORDER — ONDANSETRON HCL 4 MG/2ML IJ SOLN: 4.0000 mg | Freq: Once | INTRAMUSCULAR | Status: DC | PRN

## 2022-07-18 MED ORDER — ACETAMINOPHEN 500 MG PO TABS: 1000.0000 mg | ORAL_TABLET | Freq: Once | ORAL | Status: DC

## 2022-07-18 MED ORDER — LIDOCAINE HCL (CARDIAC) PF 100 MG/5ML IV SOSY
PREFILLED_SYRINGE | INTRAVENOUS | Status: DC | PRN
  Administered 2022-07-18: 50 mg via INTRATRACHEAL

## 2022-07-18 SURGICAL SUPPLY — 47 items
ADH SKN CLS APL DERMABOND .7 (GAUZE/BANDAGES/DRESSINGS) ×1
BAG COUNTER SPONGE SURGICOUNT (BAG) ×2 IMPLANT
BAG SPNG CNTER NS LX DISP (BAG) ×1
CANISTER SUCT 3000ML PPV (MISCELLANEOUS) IMPLANT
COVER SURGICAL LIGHT HANDLE (MISCELLANEOUS) ×2 IMPLANT
DERMABOND ADVANCED (GAUZE/BANDAGES/DRESSINGS) ×1
DERMABOND ADVANCED .7 DNX12 (GAUZE/BANDAGES/DRESSINGS) ×2 IMPLANT
DISSECTOR BLUNT TIP ENDO 5MM (MISCELLANEOUS) IMPLANT
ELECT REM PT RETURN 9FT ADLT (ELECTROSURGICAL) ×1
ELECTRODE REM PT RTRN 9FT ADLT (ELECTROSURGICAL) ×2 IMPLANT
GLOVE BIO SURGEON STRL SZ7.5 (GLOVE) ×2 IMPLANT
GLOVE SURG SYN 7.5  E (GLOVE) ×1
GLOVE SURG SYN 7.5 E (GLOVE) ×1 IMPLANT
GLOVE SURG SYN 7.5 PF PI (GLOVE) ×2 IMPLANT
GOWN STRL REUS W/ TWL LRG LVL3 (GOWN DISPOSABLE) ×4 IMPLANT
GOWN STRL REUS W/ TWL XL LVL3 (GOWN DISPOSABLE) ×2 IMPLANT
GOWN STRL REUS W/TWL LRG LVL3 (GOWN DISPOSABLE) ×2
GOWN STRL REUS W/TWL XL LVL3 (GOWN DISPOSABLE) ×1
KIT BASIN OR (CUSTOM PROCEDURE TRAY) ×2 IMPLANT
KIT TURNOVER KIT B (KITS) ×2 IMPLANT
MESH 3DMAX MID 4X6 LT LRG (Mesh General) IMPLANT
NDL INSUFFLATION 14GA 120MM (NEEDLE) IMPLANT
NEEDLE INSUFFLATION 14GA 120MM (NEEDLE) IMPLANT
NS IRRIG 1000ML POUR BTL (IV SOLUTION) ×2 IMPLANT
PAD ARMBOARD 7.5X6 YLW CONV (MISCELLANEOUS) ×4 IMPLANT
RELOAD STAPLE 4.0 BLU F/HERNIA (INSTRUMENTS) IMPLANT
RELOAD STAPLE 4.8 BLK F/HERNIA (STAPLE) IMPLANT
RELOAD STAPLE HERNIA 4.0 BLUE (INSTRUMENTS) ×1 IMPLANT
RELOAD STAPLE HERNIA 4.8 BLK (STAPLE) IMPLANT
SCISSORS LAP 5X35 DISP (ENDOMECHANICALS) ×2 IMPLANT
SET IRRIG TUBING LAPAROSCOPIC (IRRIGATION / IRRIGATOR) IMPLANT
SET TUBE SMOKE EVAC HIGH FLOW (TUBING) ×2 IMPLANT
STAPLER HERNIA 12 8.5 360D (INSTRUMENTS) IMPLANT
SUT MNCRL AB 4-0 PS2 18 (SUTURE) ×2 IMPLANT
SUT VIC AB 1 CT1 27 (SUTURE)
SUT VIC AB 1 CT1 27XBRD ANBCTR (SUTURE) IMPLANT
SYRINGE TOOMEY DISP (SYRINGE) ×2 IMPLANT
TOWEL GREEN STERILE (TOWEL DISPOSABLE) ×2 IMPLANT
TOWEL GREEN STERILE FF (TOWEL DISPOSABLE) ×2 IMPLANT
TRAY FOLEY W/BAG SLVR 14FR (SET/KITS/TRAYS/PACK) ×2 IMPLANT
TRAY LAPAROSCOPIC MC (CUSTOM PROCEDURE TRAY) ×2 IMPLANT
TROCAR OPTICAL SHORT 5MM (TROCAR) ×2 IMPLANT
TROCAR OPTICAL SLV SHORT 5MM (TROCAR) ×2 IMPLANT
TROCAR XCEL 12X100 BLDLESS (ENDOMECHANICALS) ×2 IMPLANT
TROCAR Z THREAD OPTICAL 12X100 (TROCAR) IMPLANT
WARMER LAPAROSCOPE (MISCELLANEOUS) ×2 IMPLANT
WATER STERILE IRR 1000ML POUR (IV SOLUTION) ×2 IMPLANT

## 2022-07-18 NOTE — Op Note (Signed)
07/18/2022  11:31 AM  PATIENT:  Susan Fitzgerald  88 y.o. female  PRE-OPERATIVE DIAGNOSIS:  LEFT FEMORAL HERNIA  POST-OPERATIVE DIAGNOSIS:  LEFT INDIRECT INGUINAL HERNIA  PROCEDURE:  Procedure(s): LAPAROSCOPIC LEFT INGUINAL HERNIA REPAIR WITH MESH (Left)  SURGEON:  Surgeon(s) and Role:    * Ralene Ok, MD - Primary  ASSISTANTS: Ewell Poe, RNFA   ANESTHESIA:   local and general  EBL:  minimal   BLOOD ADMINISTERED:none  DRAINS: none   LOCAL MEDICATIONS USED:  BUPIVICAINE   SPECIMEN:  No Specimen  DISPOSITION OF SPECIMEN:  N/A  COUNTS:  YES  TOURNIQUET:  * No tourniquets in log *  DICTATION: .Dragon Dictation Counts: reported as correct x 2   Findings:  The patient had a  left indirect hernia   Indications for procedure:  The patient is a 88 year old female with a left inguinal hernia for several months. Patient complained of symptomatology to the left inguinal area. The patient was taken back for elective inguinal hernia repair.   Details of the procedure: The patient was taken back to the operating room. The patient was placed in supine position with bilateral SCDs in place.  The patient was prepped and draped in the usual sterile fashion.  After appropriate anitbiotics were confirmed, a time-out was confirmed and all facts were verified.   0.25% Marcaine was used to infiltrate the umbilical area. A 11-blade was used to cut down the skin and blunt dissection was used to get the anterior fashion.  The anterior fascia was incised approximately 1 cm and the muscles were retracted laterally. Blunt dissection was then used to create a space in the preperitoneal area. At this time a 10 mm camera was then introduced into the space and advanced the pubic tubercle and a 12 mm trocar was placed over this and insufflation was started.  At this time and space was created from medial to laterally the preperitoneal space.  Cooper's ligament was initially cleaned off.  The hernia  sac was identified in the indirect space. Dissection of the round ligament and the hernia sac was dissected off.  The round ligament was cauterized and transected/   Once the hernia sac was taken down to approximately the umbilicus a Left Bard 3D MID mesh, size: Large, was  introduced into the preperitoneal space.  The mesh was brought over to cover the direct and indirect hernia spaces.  This was anchored into place and secured to Cooper's ligament with 4.66m staples from a Coviden hernia stapler. It was anchored to the anterior abdominal wall with 4.8 mm staples. The hernia sac was seen lying posterior to the mesh. There was no staples placed laterally. The insufflation was evacuated and the peritoneum was seen posterior to the mesh. The trochars were removed. The anterior fascia was reapproximated using #1 Vicryl on a UR- 6.  Intra-abdominal air was evacuated and the Veress needle removed. The skin was reapproximated using 4-0 Monocryl subcuticular fashion and Dermabond. The patient was awakened from general anesthesia and taken to recovery in stable condition.    PLAN OF CARE: Discharge to home after PACU  PATIENT DISPOSITION:  PACU - hemodynamically stable.   Delay start of Pharmacological VTE agent (>24hrs) due to surgical blood loss or risk of bleeding: not applicable

## 2022-07-18 NOTE — Discharge Instructions (Signed)
[facility] _______[facility] ? ?INGUINAL HERNIA REPAIR: POST OP INSTRUCTIONS ? ?Always review your discharge instruction sheet given to you by the facility where your surgery was performed. ?IF YOU HAVE DISABILITY OR FAMILY LEAVE FORMS, YOU MUST BRING THEM TO THE OFFICE FOR PROCESSING.   ?DO NOT GIVE THEM TO YOUR DOCTOR. ? ?1. A  prescription for pain medication may be given to you upon discharge.  Take your pain medication as prescribed, if needed.  If narcotic pain medicine is not needed, then you may take acetaminophen (Tylenol) or ibuprofen (Advil) as needed. ?2. Take your usually prescribed medications unless otherwise directed. ?If you need a refill on your pain medication, please contact your pharmacy.  They will contact our office to request authorization. Prescriptions will not be filled after 5 pm or on week-ends. ?3. You should follow a light diet the first 24 hours after arrival home, such as soup and crackers, etc.  Be sure to include lots of fluids daily.  Resume your normal diet the day after surgery. ?4.Most patients will experience some swelling and bruising around the umbilicus or in the groin and scrotum.  Ice packs and reclining will help.  Swelling and bruising can take several days to resolve.  ?6. It is common to experience some constipation if taking pain medication after surgery.  Increasing fluid intake and taking a stool softener (such as Colace) will usually help or prevent this problem from occurring.  A mild laxative (Milk of Magnesia or Miralax) should be taken according to package directions if there are no bowel movements after 48 hours. ?7. Unless discharge instructions indicate otherwise, you may remove your bandages 24-48 hours after surgery, and you may shower at that time.  You may have steri-strips (small skin tapes) in place directly over the incision.  These strips should be left on the skin for 7-10 days.  If your surgeon used skin glue on the incision, you may  shower in 24 hours.  The glue will flake off over the next 2-3 weeks.  Any sutures or staples will be removed at the office during your follow-up visit. ?8. ACTIVITIES:  You may resume regular (light) daily activities beginning the next day--such as daily self-care, walking, climbing stairs--gradually increasing activities as tolerated.  You may have sexual intercourse when it is comfortable.  Refrain from any heavy lifting or straining until approved by your doctor. ? ?a.You may drive when you are no longer taking prescription pain medication, you can comfortably wear a seatbelt, and you can safely maneuver your car and apply brakes. ?b.RETURN TO WORK:   ?_____________________________________________ ? ?9.You should see your doctor in the office for a follow-up appointment approximately 2-3 weeks after your surgery.  Make sure that you call for this appointment within a day or two after you arrive home to insure a convenient appointment time. ?10.OTHER INSTRUCTIONS: _________________________ ?   _____________________________________ ? ?WHEN TO CALL YOUR DOCTOR: ?Fever over 101.0 ?Inability to urinate ?Nausea and/or vomiting ?Extreme swelling or bruising ?Continued bleeding from incision. ?Increased pain, redness, or drainage from the incision ? ?The clinic staff is available to answer your questions during regular business hours.  Please don?t hesitate to call and ask to speak to one of the nurses for clinical concerns.  If you have a medical emergency, go to the nearest emergency room or call 911.  A surgeon from [facility] is always on call at the hospital ? ? ?[address], [address], [place], [place]    [address] ? ? [address] [address], [place], [place]   [address] ?[phone] ? [phone] ? FAX [phone] ?Web site: [email or web address] ? ?

## 2022-07-18 NOTE — Anesthesia Procedure Notes (Signed)
Procedure Name: Intubation Date/Time: 07/18/2022 11:02 AM  Performed by: Leonor Liv, CRNAPre-anesthesia Checklist: Patient identified, Emergency Drugs available, Suction available and Patient being monitored Patient Re-evaluated:Patient Re-evaluated prior to induction Oxygen Delivery Method: Circle system utilized Preoxygenation: Pre-oxygenation with 100% oxygen Induction Type: IV induction Ventilation: Mask ventilation without difficulty Laryngoscope Size: Mac and 3 Grade View: Grade I Tube type: Oral Tube size: 7.0 mm Number of attempts: 1 Airway Equipment and Method: Stylet Placement Confirmation: ETT inserted through vocal cords under direct vision, positive ETCO2 and breath sounds checked- equal and bilateral Secured at: 21 cm Tube secured with: Tape Dental Injury: Teeth and Oropharynx as per pre-operative assessment

## 2022-07-18 NOTE — Anesthesia Postprocedure Evaluation (Signed)
Anesthesia Post Note  Patient: Susan Fitzgerald  Procedure(s) Performed: LAPAROSCOPIC LEFT INGUINAL HERNIA REPAIR WITH MESH POSSIBLE OPEN (Left)     Patient location during evaluation: PACU Anesthesia Type: General Level of consciousness: sedated and patient cooperative Pain management: pain level controlled Vital Signs Assessment: post-procedure vital signs reviewed and stable Respiratory status: spontaneous breathing Cardiovascular status: stable Anesthetic complications: no   No notable events documented.  Last Vitals:  Vitals:   07/18/22 1215 07/18/22 1230  BP: (!) 184/87 (!) 187/90  Pulse: 77 81  Resp: 14 16  Temp:  36.4 C  SpO2: 99% 98%    Last Pain:  Vitals:   07/18/22 1230  PainSc: [place]

## 2022-07-18 NOTE — Interval H&P Note (Signed)
History and Physical Interval Note:  07/18/2022 9:57 AM  Susan Fitzgerald  has presented today for surgery, with the diagnosis of LEFT FEMORAL HERNIA.  The various methods of treatment have been discussed with the patient and family. After consideration of risks, benefits and other options for treatment, the patient has consented to  Procedure(s): LAPAROSCOPIC LEFT INGUINAL HERNIA REPAIR WITH MESH POSSIBLE OPEN (Left) as a surgical intervention.  The patient's history has been reviewed, patient examined, no change in status, stable for surgery.  I have reviewed the patient's chart and labs.  Questions were answered to the patient's satisfaction.     Ralene Ok

## 2022-07-18 NOTE — Transfer of Care (Signed)
Immediate Anesthesia Transfer of Care Note  Patient: Susan Fitzgerald  Procedure(s) Performed: LAPAROSCOPIC LEFT INGUINAL HERNIA REPAIR WITH MESH POSSIBLE OPEN (Left)  Patient Location: PACU  Anesthesia Type:General  Level of Consciousness: awake, alert  and oriented  Airway & Oxygen Therapy: Patient Spontanous Breathing  Post-op Assessment: Report given to RN, Post -op Vital signs reviewed and stable and Patient moving all extremities  Post vital signs: Reviewed and stable  Last Vitals:  Vitals Value Taken Time  BP    Temp    Pulse 87 07/18/22 1150  Resp 12 07/18/22 1150  SpO2 98 % 07/18/22 1150  Vitals shown include unvalidated device data.  Last Pain:  Vitals:   07/18/22 0931  PainSc: 0-No pain         Complications: No notable events documented.

## 2022-07-19 ENCOUNTER — Encounter (HOSPITAL_COMMUNITY): Payer: Self-pay | Admitting: General Surgery

## 2022-07-23 ENCOUNTER — Other Ambulatory Visit: Payer: Self-pay | Admitting: Cardiology

## 2022-07-23 ENCOUNTER — Other Ambulatory Visit: Payer: Self-pay | Admitting: Family Medicine

## 2022-08-05 ENCOUNTER — Other Ambulatory Visit: Payer: Self-pay | Admitting: *Deleted

## 2022-08-05 DIAGNOSIS — J849 Interstitial pulmonary disease, unspecified: Secondary | ICD-10-CM

## 2022-08-05 MED ORDER — ESBRIET 267 MG PO TABS: 801.0000 mg | ORAL_TABLET | Freq: Three times a day (TID) | ORAL | 5 refills | Status: DC

## 2022-08-06 DIAGNOSIS — Z08 Encounter for follow-up examination after completed treatment for malignant neoplasm: Secondary | ICD-10-CM | POA: Diagnosis not present

## 2022-08-06 DIAGNOSIS — D485 Neoplasm of uncertain behavior of skin: Secondary | ICD-10-CM | POA: Diagnosis not present

## 2022-08-06 DIAGNOSIS — D225 Melanocytic nevi of trunk: Secondary | ICD-10-CM | POA: Diagnosis not present

## 2022-08-06 DIAGNOSIS — L821 Other seborrheic keratosis: Secondary | ICD-10-CM | POA: Diagnosis not present

## 2022-08-06 DIAGNOSIS — L820 Inflamed seborrheic keratosis: Secondary | ICD-10-CM | POA: Diagnosis not present

## 2022-08-06 DIAGNOSIS — Z85828 Personal history of other malignant neoplasm of skin: Secondary | ICD-10-CM | POA: Diagnosis not present

## 2022-08-19 ENCOUNTER — Other Ambulatory Visit: Payer: Medicare Other

## 2022-08-19 DIAGNOSIS — M0579 Rheumatoid arthritis with rheumatoid factor of multiple sites without organ or systems involvement: Secondary | ICD-10-CM | POA: Diagnosis not present

## 2022-09-10 ENCOUNTER — Telehealth: Payer: Self-pay | Admitting: Pharmacist

## 2022-09-10 NOTE — Telephone Encounter (Signed)
Received fax from [employer] requesting reverification of patient's eligibility for patient assistance program for her Esbriet. Spoke with patient and completed form over the phone. Faxed to [employer] ([phone])    Ottie Neglia, PharmD, MPH, BCPS, CPP Clinical Pharmacist (Rheumatology and Pulmonology) 

## 2022-09-11 ENCOUNTER — Telehealth: Payer: Self-pay

## 2022-09-11 ENCOUNTER — Ambulatory Visit (INDEPENDENT_AMBULATORY_CARE_PROVIDER_SITE_OTHER): Payer: Medicare Other

## 2022-09-11 DIAGNOSIS — Z23 Encounter for immunization: Secondary | ICD-10-CM | POA: Diagnosis not present

## 2022-09-11 NOTE — Telephone Encounter (Signed)
Yes to both given cardiovascular and lung disease.  Will be due for PNA 20 in 2024.  Can get RSV vaccine at her convenience.

## 2022-09-11 NOTE — Telephone Encounter (Signed)
Patient want to know if Dr. Lajuana Ripple recommends for her to get the RSV vaccine and if it is recommended based on her history to get any more pneumonia vaccine. Please advise

## 2022-09-12 NOTE — Telephone Encounter (Signed)
Attempted to call pt , left vm for cb  

## 2022-09-12 NOTE — Telephone Encounter (Signed)
Pt has been made aware. 

## 2022-09-17 ENCOUNTER — Ambulatory Visit
Admission: RE | Admit: 2022-09-17 | Discharge: 2022-09-17 | Disposition: A | Discharge status: Home / Self Care | Payer: Medicare Other | Source: Ambulatory Visit | Attending: Pulmonary Disease | Admitting: Pulmonary Disease

## 2022-09-17 DIAGNOSIS — J84112 Idiopathic pulmonary fibrosis: Secondary | ICD-10-CM | POA: Diagnosis not present

## 2022-09-17 DIAGNOSIS — R911 Solitary pulmonary nodule: Secondary | ICD-10-CM

## 2022-09-17 DIAGNOSIS — I700 Atherosclerosis of aorta: Secondary | ICD-10-CM | POA: Diagnosis not present

## 2022-09-17 DIAGNOSIS — R918 Other nonspecific abnormal finding of lung field: Secondary | ICD-10-CM | POA: Diagnosis not present

## 2022-09-17 DIAGNOSIS — J479 Bronchiectasis, uncomplicated: Secondary | ICD-10-CM | POA: Diagnosis not present

## 2022-09-20 ENCOUNTER — Encounter: Payer: Self-pay | Admitting: Pulmonary Disease

## 2022-09-20 ENCOUNTER — Ambulatory Visit: Payer: Medicare Other | Admitting: Pulmonary Disease

## 2022-09-20 VITALS — BP 130/68 | HR 81 | Temp 98.2°F | Ht 60.0 in | Wt 120.8 lb

## 2022-09-20 DIAGNOSIS — R0602 Shortness of breath: Secondary | ICD-10-CM | POA: Diagnosis not present

## 2022-09-20 DIAGNOSIS — J849 Interstitial pulmonary disease, unspecified: Secondary | ICD-10-CM

## 2022-09-20 DIAGNOSIS — Z5181 Encounter for therapeutic drug level monitoring: Secondary | ICD-10-CM | POA: Diagnosis not present

## 2022-09-20 LAB — COMPREHENSIVE METABOLIC PANEL
ALT: 10 U/L (ref 0–35)
AST: 23 U/L (ref 0–37)
Albumin: 3.8 g/dL (ref 3.5–5.2)
Alkaline Phosphatase: 62 U/L (ref 39–117)
BUN: 25 mg/dL — ABNORMAL HIGH (ref 6–23)
CO2: 34 mEq/L — ABNORMAL HIGH (ref 19–32)
Calcium: 9.9 mg/dL (ref 8.4–10.5)
Chloride: 100 mEq/L (ref 96–112)
Creatinine, Ser: 1.12 mg/dL (ref 0.40–1.20)
GFR: 43.92 mL/min — ABNORMAL LOW (ref >60.00)
Glucose, Bld: 84 mg/dL (ref 70–99)
Potassium: 4.3 mEq/L (ref 3.5–5.1)
Sodium: 139 mEq/L (ref 135–145)
Total Bilirubin: 0.3 mg/dL (ref 0.2–1.2)
Total Protein: 7 g/dL (ref 6.0–8.3)

## 2022-09-20 NOTE — Progress Notes (Signed)
CT               Susan Fitzgerald    [id number]    [birth year]  Primary Care Physician:Gottschalk, Koleen Distance, DO  Referring Physician: [name], DO [place],  [place] [address]  Problem list: RA ILD On imuran Ofev poorly tolerated due to side effects On Esbriet since December 2020  HPI: 88 y.o.  with past medical history of atrial fibrillation, rheumatoid arthritis, pulmonary fibrosis.  Referred here for a second opinion on pulmonary fibrosis from Dr. Valeta Harms  She has been diagnosed with rheumatoid arthritis around 2017.  She follows with Dr. Amil Amen.  She was initially on methotrexate which was held briefly in 2019 due to elevated creatinine, but restarted around November 2019 for worsening symptoms.  She did not tolerate leflunomide due to significant hair loss.  Methotrexate was stopped and she was started started on azathioprine after discussion with Dr. 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  This was poorly tolerated due to diarrhea, constipation and lower GI bleed Antifibrotic's change to Esbriet in December 2020 which is tolerated much better than Ofev Esbriet was changed to generic pirfenidone in summer 2022.  She does not like the full effect as the pills are bigger and went back to Esbriet in September 2022  Finished pulmonary rehab in 2020 with improvement in symptoms of dyspnea.  She is back to golfing  Diagnosed with COVID-07 October 2019.  Did not require hospitalization. Follows with Dr. Percival Spanish for management of atrial fibrillation  Extended ILD questionnaire 02/12/2019-no known exposures, no mold, hot tub, Jacuzzi, humidifier, no birds at home. Smoking history: Never smoker Travel history: No significant travel history Relevant family history: No family history of lung disease or pulmonary fibrosis.  Interim history: Has hernia repair earlier this year without any  complication.  She has resumed golfing 3 weeks postsurgery Here for review of CT.  Breathing is stable with no issues.   Outpatient Encounter Medications as of 09/20/2022  Medication Sig   apixaban (ELIQUIS) 2.5 MG TABS tablet Take 1 tablet (2.5 mg total) by mouth 2 (two) times daily.   azaTHIOprine (IMURAN) 50 MG tablet Take 50 mg by mouth daily.    Calcium Carbonate-Vitamin D 600-10 MG-MCG TABS Take 1 tablet by mouth 2 (two) times daily.   diltiazem (CARDIZEM CD) 300 MG 24 hr capsule TAKE 1 CAPSULE BY MOUTH DAILY   ESBRIET 267 MG TABS Take 3 tablets (801 mg total) by mouth with breakfast, with lunch, and with evening meal.   furosemide (LASIX) 20 MG tablet TAKE 1 TO 2 TABLETS BY MOUTH  DAILY AS NEEDED (Patient taking differently: Take 20 mg by mouth daily as needed for edema.)   gabapentin (NEURONTIN) 100 MG capsule Take 100 mg by mouth at bedtime.    ketoconazole (NIZORAL) 2 % shampoo Apply 1 Application topically daily as needed for irritation.   levothyroxine (SYNTHROID) 75 MCG tablet Take 1 tablet (75 mcg total) by mouth daily before breakfast.   lisinopril (ZESTRIL) 20 MG tablet TAKE 1 TABLET BY MOUTH DAILY   lisinopril-hydrochlorothiazide (ZESTORETIC) 20-12.5 MG tablet TAKE 1 TABLET BY MOUTH DAILY IN  THE MORNING   Melatonin 5 MG TABS Take 5 mg by mouth at bedtime.   Multiple Vitamin (MULTIVITAMIN) capsule Take 1 capsule by mouth daily.   potassium chloride SA (KLOR-CON M) 20 MEQ tablet TAKE 1 TABLET BY MOUTH DAILY  Probiotic Product (PROBIOTIC-10 ULTIMATE PO) Take 1 tablet by mouth daily.   traMADol (ULTRAM) 50 MG tablet Take 1 tablet (50 mg total) by mouth every 6 (six) hours as needed. (Patient not taking: Reported on 09/20/2022)   [DISCONTINUED] ondansetron (ZOFRAN-ODT) 4 MG disintegrating tablet Take 1 tablet (4 mg total) by mouth every 8 (eight) hours as needed for nausea or vomiting.   [DISCONTINUED] tiZANidine (ZANAFLEX) 4 MG tablet Take 1 tablet (4 mg total) by mouth every 6  (six) hours as needed for muscle spasms. (Patient not taking: Reported on 07/12/2022)   No facility-administered encounter medications on file as of 09/20/2022.   Physical Exam: Blood pressure 130/68, pulse 81, temperature 98.2 F (36.8 C), temperature source Oral, height 5' (1.524 m), weight 120 lb 12.8 oz (54.8 kg), SpO2 99 %. Gen:      No acute distress HEENT:  EOMI, sclera anicteric Neck:     No masses; no thyromegaly Lungs:    Clear to auscultation bilaterally; normal respiratory effort CV:         Regular rate and rhythm; no murmurs Abd:      + bowel sounds; soft, non-tender; no palpable masses, no distension Ext:    No edema; adequate peripheral perfusion Skin:      Warm and dry; no rash Neuro: alert and oriented x 3 Psych: normal mood and affect   Data Reviewed: Imaging: CT chest 02/09/2017- mild reticulation, interstitial prominence at the bases. CT chest 06/23/2018- groundglass attenuation, interlobular septal thickening, subpleural reticulation mild air trapping. CT chest 01/13/2019- peripheral and basilar pattern of subpleural reticulation, groundglass and traction bronchiectasis.  No honeycombing.  Probable UIP pattern. CT high-resolution 08/31/2019-stable pulmonary fibrosis probable UIP pattern CT high-resolution 09/06/2020-stable probable UIP pattern pulmonary fibrosis CT high-resolution 09/10/2021-stable pulmonary fibrosis, new 5 mm lung nodule CT high-resolution 09/17/2022-mild progression of probable UIP pulmonary fibrosis.  Left apex lung nodule has resolved. I reviewed the images personally.  PFTs: 01/20/2019 FVC 2.17 [114%), FEV1 1.67 [120%],/F 77, TLC 78%, DLCO 56% Minimal restriction with moderate diffusion impairment.  06/05/2020 FVC 2.17 [119%], FEV1 1.68 [127%], F/F 77, TLC 4.36 [99%], DLCO 10.30 [65%] Mild diffusion impairment.  Improvement compared to 2020  Labs: CCP 02/15/2016-24 Rheumatoid factor 02/14/2018-23.2  Cardiac: Echocardiogram 04/01/2022-mild  reduction in RV systolic function, estimated RVSP 42.2, mildly elevated PA systolic pressure.  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  Other considerations include amiodarone toxicity but she has been off amiodarone for some time now.  Methotrexate can cause pulmonary fibrosis in this pattern as well. Would not recommend work-up for IPF such as lung biopsy as it would not change our recommended management.   Did not tolerate Ofev but is doing well with esbriet.  She prefers to take 3 tablets 3 times a day as she does not like the bigger single dose tablet Finished pulmonary rehab and remains active with golf CT reviewed with mild progression which is expected for the disease Continue monitoring Check CMP and proBNP today.  Echo done by Dr. Percival Spanish reviewed with mild pulmonary hypertension.  Since she is doing well clinically we will continue to monitor this closely.  Lung nodule Follow-up on repeat CT scan  Health maintenance Up-to-date with vaccination  Plan/Recommendations: - Continue brand-name Esbriet.  - Check labs for monitoring   Marshell Garfinkel MD [facility] 09/20/2022, 11:34 AM  CC: [name], DO

## 2022-09-20 NOTE — Patient Instructions (Signed)
I am glad you are back to golfing and feeling well CT shows slight progression which is expected Continue current medication We will check some labs today for monitoring Order PFTs and follow-up in 6 months

## 2022-09-21 LAB — PRO B NATRIURETIC PEPTIDE: NT-Pro BNP: 1774 pg/mL — ABNORMAL HIGH (ref 0–738)

## 2022-10-05 ENCOUNTER — Other Ambulatory Visit: Payer: Self-pay | Admitting: Family Medicine

## 2022-10-14 ENCOUNTER — Ambulatory Visit (INDEPENDENT_AMBULATORY_CARE_PROVIDER_SITE_OTHER): Payer: Medicare Other | Admitting: Family Medicine

## 2022-10-14 ENCOUNTER — Encounter: Payer: Self-pay | Admitting: Family Medicine

## 2022-10-14 VITALS — BP 141/71 | HR 88 | Temp 97.6°F | Ht 60.0 in | Wt 123.3 lb

## 2022-10-14 DIAGNOSIS — H6123 Impacted cerumen, bilateral: Secondary | ICD-10-CM | POA: Diagnosis not present

## 2022-10-14 DIAGNOSIS — I4819 Other persistent atrial fibrillation: Secondary | ICD-10-CM | POA: Diagnosis not present

## 2022-10-14 DIAGNOSIS — E039 Hypothyroidism, unspecified: Secondary | ICD-10-CM

## 2022-10-14 DIAGNOSIS — I10 Essential (primary) hypertension: Secondary | ICD-10-CM

## 2022-10-14 DIAGNOSIS — N1831 Chronic kidney disease, stage 3a: Secondary | ICD-10-CM

## 2022-10-14 DIAGNOSIS — L989 Disorder of the skin and subcutaneous tissue, unspecified: Secondary | ICD-10-CM | POA: Diagnosis not present

## 2022-10-14 DIAGNOSIS — N3941 Urge incontinence: Secondary | ICD-10-CM

## 2022-10-14 NOTE — Progress Notes (Signed)
Subjective: CC: Chronic follow-up, multiple concerns PCP: [name], DO ZJQ:BHALP Susan Fitzgerald is a 88 y.o. female presenting to clinic today for:  1.  Hearing difficulty/ear fullness She would like to have her ears checked as they feel full and she thinks they may be irrigated  2.  Atrial fibrillation Patient reports compliance with all medications.  Denies any bleeding episodes.  Has forms today that she would like me to look through so that she can continue the patient assistance program for Eliquis.  She brings a log of blood pressures, some of these are systolics of 37T and some are 150s.  She has an appointment with her cardiologist coming up soon and plans on discussing blood pressure regimen with him.  She takes her lisinopril twice daily, 1 of which has some hydrochlorothiazide added into it.  When she knows that she is going to go golfing she does not take the hydrochlorothiazide or her Lasix.  She pretty much uses Lasix on a daily basis but only at 1 tablet.  She denies any fluid overload.  From an exercise standpoint she is tolerating physical activity without difficulty and continues golfing.  Sometimes she does have some low back pain but this is not really related to golf game and rather more with standing or vacuuming.  She also admits that she only takes Tylenol when she golfs or perhaps stops the issue.  3.  Scalp irritation Patient reports scalp irritation and a spot on her scalp that crusts over and scabs.  She is under the care of dermatology but has not scheduled an appointment for sooner checkup.  Currently on the books for February.  She had several lesions removed over at the office with Dr. Nevada Crane but she prefers to see her own specialist at [facility] dermatology.   4.  Urinary incontinence Patient reports that she has incontinence when she tries to make it to the bathroom.  No dysuria or hematuria.  Symptoms do to be more pronounced when she takes her  diuretic   ROS: Per HPI  Allergies  Allergen Reactions   Benazepril Hcl Cough   Past Medical History:  Diagnosis Date   Calcification of bronchial airway 06/23/2018   01/2017 - See on CT imaging of the chest    Cataract    Cerebral vascular disease 06/19/2018   06/19/2018 - MRI with chronic microvascular ischemic change   Diverticulosis    Hypertension    Osteoporosis    PAF (paroxysmal atrial fibrillation) ([place])    a. s/p prior DCCV; b. On flecainide & coumadin (CHA2DS2VASc = 4);  c. 03/2016 Echo: EF 55-60%, mod LVH, mod AI, mild to mod TR, PASP 52mHg.   Pelvic fracture (HCC)    Pulmonary fibrosis [place] 2018   [place]oid [place]    Current Outpatient Medications:    apixaban (ELIQUIS) 2.5 MG TABS tablet, Take 1 tablet (2.5 mg total) by mouth 2 (two) times daily., Disp: 180 tablet, Rfl: 3   azaTHIOprine (IMURAN) 50 MG tablet, Take 50 mg by mouth daily. , Disp: , Rfl:    Calcium Carbonate-Vitamin D 600-10 MG-MCG TABS, Take 1 tablet by mouth 2 (two) times daily., Disp: , Rfl:    diltiazem (CARDIZEM CD) 300 MG 24 hr capsule, TAKE 1 CAPSULE BY MOUTH DAILY, Disp: 100 capsule, Rfl: 2   ESBRIET 267 MG TABS, Take 3 tablets (801 mg total) by mouth with breakfast, with lunch, and with evening meal., Disp: 270 tablet, Rfl: 5  furosemide (LASIX) 20 MG tablet, TAKE 1 TO 2 TABLETS BY MOUTH  DAILY AS NEEDED, Disp: 200 tablet, Rfl: 0   gabapentin (NEURONTIN) 100 MG capsule, Take 100 mg by mouth at bedtime. , Disp: , Rfl:    ketoconazole (NIZORAL) 2 % shampoo, Apply 1 Application topically daily as needed for irritation., Disp: , Rfl:    levothyroxine (SYNTHROID) 75 MCG tablet, Take 1 tablet (75 mcg total) by mouth daily before breakfast., Disp: 90 tablet, Rfl: 0   lisinopril (ZESTRIL) 20 MG tablet, TAKE 1 TABLET BY MOUTH DAILY, Disp: 100 tablet, Rfl: 2   lisinopril-hydrochlorothiazide (ZESTORETIC) 20-12.5 MG tablet, TAKE 1 TABLET BY MOUTH DAILY IN  THE MORNING, Disp: 100 tablet, Rfl:  2   Melatonin 5 MG TABS, Take 5 mg by mouth at bedtime., Disp: , Rfl:    Multiple Vitamin (MULTIVITAMIN) capsule, Take 1 capsule by mouth daily., Disp: , Rfl:    potassium chloride SA (KLOR-CON M) 20 MEQ tablet, TAKE 1 TABLET BY MOUTH DAILY, Disp: 100 tablet, Rfl: 0   Probiotic Product (PROBIOTIC-10 ULTIMATE PO), Take 1 tablet by mouth daily., Disp: , Rfl:  Social History   Socioeconomic History   Marital status: Married    Spouse name: Engineer, technical sales    Number of children: 2   Years of education: 12   Highest education level: Not on file  Occupational History   Occupation: RETIRED  Tobacco Use   Smoking status: Never   Smokeless tobacco: Never  Vaping Use   Vaping Use: Never used  Substance and Sexual Activity   Alcohol use: No   Drug use: No   Sexual activity: Yes    Birth control/protection: Post-menopausal  Other Topics Concern   Not on file  Social History Narrative   Lives with her husband.   Enjoys playing golf.   Still very active and independent   Social Determinants of Health   Financial Resource Strain: Low Risk  (11/02/2021)   Overall Financial Resource Strain (CARDIA)    Difficulty of Paying Living Expenses: Not hard at all  Food Insecurity: No Food Insecurity (11/02/2021)   Hunger Vital Sign    Worried About Running Out of Food in the Last Year: Never true    Ran Out of Food in the Last Year: Never true  Transportation Needs: No Transportation Needs (11/02/2021)   PRAPARE - Hydrologist (Medical): No    Lack of Transportation (Non-Medical): No  Physical Activity: Sufficiently Active (11/02/2021)   Exercise Vital Sign    Days of Exercise per Week: 2 days    Minutes of Exercise per Session: 90 min  Stress: No Stress Concern Present (11/02/2021)   [place]    Feeling of Stress : Not at all  Social Connections: Moderately Integrated (11/02/2021)   Social Connection and  Isolation Panel [NHANES]    Frequency of Communication with Friends and Family: More than three times a week    Frequency of Social Gatherings with Friends and Family: Twice a week    Attends Religious Services: More than 4 times per year    Active Member of [employer] or Organizations: No    Attends Archivist Meetings: Never    Marital Status: Married  Human resources officer Violence: Not At Risk (11/02/2021)   Humiliation, Afraid, Rape, and Kick questionnaire    Fear of Current or Ex-Partner: No    Emotionally Abused: No    Physically Abused: No  Sexually Abused: No   Family History  Problem Relation Age of Onset   Stroke Mother        cerebral hemorrhage   Heart disease Father        MI   Heart attack Father    Vision loss Father    Heart disease Brother    Heart attack Brother    Cancer Maternal Aunt        breast   Cancer Brother    Heart disease Brother    Cancer Daughter        breast   Heart disease Son    Colon cancer Neg Hx    Colon polyps Neg Hx    Rectal cancer Neg Hx    Stomach cancer Neg Hx     Objective: Office vital signs reviewed. BP (!) 141/71   Pulse 88   Temp 97.6 F (36.4 C)   Ht 5' (1.524 m)   Wt 123 lb 4.8 oz (55.9 kg)   SpO2 97%   BMI 24.08 kg/m   Physical Examination:  General: Awake, alert, well nourished, No acute distress HEENT: Cerumen appreciated in bilateral external auditory canals Cardio: Irregularly irregular with rate controlled., S1S2 heard, no murmurs appreciated Pulm: clear to auscultation bilaterally, no wheezes, rhonchi or rales; normal work of breathing on room air Skin: 1.5 mm x 2 mm pigmented skin lesion with irregular shape/borders noted at the apex of the scalp.  She also has a scaly lesion consistent with an actinic keratosis close to this lesion    Assessment/ Plan: 88 y.o. female   Acquired hypothyroidism - Plan: TSH, T4, Free  Stage 3a chronic kidney disease ([place]) - Plan: CBC  Essential  hypertension  Atrial fibrillation, persistent (HCC) - Plan: CBC  Skin lesion of scalp  Urge incontinence - Plan: Ambulatory referral to Physical Therapy  Bilateral impacted cerumen  Check thyroid levels, CBC given known renal disease.  I reviewed her most recent labs which showed stability of her kidneys  Blood pressure is controlled upon recheck.  She had rate controlled A-fib on exam.  Check CBC given chronic anticoagulation.  I completed the forms for her patient assistance program and a copy will be placed in her medical record.  The original has been given back to her so that she may submit them after completion of her portion.  I recommended that she follow-up with her dermatologist ASAP.  I suspect that she probably has some actinic keratoses but there was a pigmented lesion at the apex of the scalp and since she does not routinely use a cap and is frequently in the sunlight I would like to make sure that this is not any type of malignant or premalignant lesion.  I have referred her for pelvic floor rehabilitation in efforts to improve urge incontinence  Cerumen successfully irrigated.  Orders Placed This Encounter  Procedures   CBC   TSH   T4, Free   No orders of the defined types were placed in this encounter.    [name], DO [place] [phone]

## 2022-10-15 ENCOUNTER — Other Ambulatory Visit: Payer: Self-pay | Admitting: Family Medicine

## 2022-10-15 LAB — CBC
Hematocrit: 39 % (ref 34.0–46.6)
Hemoglobin: 13.2 g/dL (ref 11.1–15.9)
MCH: 33.7 pg — ABNORMAL HIGH (ref 26.6–33.0)
MCHC: 33.8 g/dL (ref 31.5–35.7)
MCV: 100 fL — ABNORMAL HIGH (ref 79–97)
Platelets: 141 10*3/uL — ABNORMAL LOW (ref 150–450)
RBC: 3.92 x10E6/uL (ref 3.77–5.28)
RDW: 12.4 % (ref 11.7–15.4)
WBC: 5 10*3/uL (ref 3.4–10.8)

## 2022-10-15 LAB — T4, FREE: Free T4: 1.54 ng/dL (ref 0.82–1.77)

## 2022-10-15 LAB — TSH: TSH: 1.56 u[IU]/mL (ref 0.450–4.500)

## 2022-10-15 MED ORDER — LEVOTHYROXINE SODIUM 75 MCG PO TABS
75.0000 ug | ORAL_TABLET | Freq: Every day | ORAL | 3 refills | Status: DC
Start: 2022-10-15 — End: 2023-07-22

## 2022-10-17 DIAGNOSIS — D485 Neoplasm of uncertain behavior of skin: Secondary | ICD-10-CM | POA: Diagnosis not present

## 2022-10-17 DIAGNOSIS — L218 Other seborrheic dermatitis: Secondary | ICD-10-CM | POA: Diagnosis not present

## 2022-10-17 DIAGNOSIS — C44722 Squamous cell carcinoma of skin of right lower limb, including hip: Secondary | ICD-10-CM | POA: Diagnosis not present

## 2022-10-17 DIAGNOSIS — L820 Inflamed seborrheic keratosis: Secondary | ICD-10-CM | POA: Diagnosis not present

## 2022-10-17 DIAGNOSIS — L570 Actinic keratosis: Secondary | ICD-10-CM | POA: Diagnosis not present

## 2022-10-17 DIAGNOSIS — D224 Melanocytic nevi of scalp and neck: Secondary | ICD-10-CM | POA: Diagnosis not present

## 2022-10-17 DIAGNOSIS — L538 Other specified erythematous conditions: Secondary | ICD-10-CM | POA: Diagnosis not present

## 2022-10-22 NOTE — Progress Notes (Unsigned)
Cardiology Office Note   Date:  10/23/2022   ID:  Militza, Susan Fitzgerald [birth year], MRN [id number]  PCP:  [name], DO  Cardiologist:   Minus Breeding, MD   Chief Complaint  Patient presents with   Atrial Fibrillation    History of Present Illness: Susan Fitzgerald is a 88 y.o. female who presents for evaluation of atrial fibrillation. She was cardioverted.  However, she developed recurrent atrial fib.  She failed flecainide.    She has been followed in the atrial fib clinic.  She did describe chest pain and was sent for [facility] which did not suggest ischemia.  She does have a mildly reduced EF (40 - 45% on echo.) Her last echo in March 2019 demonstrated an improved EF of 55 - 60%.    She was started on amiodarone and underwent DCCV. She had increased dyspnea and decreased O2 sats.  She was treated for pneumonia.  She was referred to pulmonary.  A CT was done.  There was a thought that she might have an amiodarone pulmonary process.  Other processes could not be excluded such as a reaction to her methotrexate.  There was also suggestion of pulmonary edema.  She has been treated with diuresis.   She did have an increased creat but this is improved.   She was treated with steroids and did improve.    She has not been told that she has idiopathic idiopathic pulmonary fibrosis.  She had cardioversion again in March 2020 which she probably only helped sinus rhythm for about 4 weeks.  She does not really notice she felt remarkably different during that time.    Since I last saw her prior to hernia repair.  She comes today and she had no new complaints.  She does show me that her proBNP is elevated although it is lower than previous.  Despite that she is not having any new shortness of breath, PND or orthopnea.  She has had no new chest pressure, neck or arm discomfort.  She still does a little golfing.  Past Medical History:  Diagnosis Date   Calcification of bronchial airway  06/23/2018   01/2017 - See on CT imaging of the chest    Cataract    Cerebral vascular disease 06/19/2018   06/19/2018 - MRI with chronic microvascular ischemic change   Diverticulosis    Hypertension    Osteoporosis    PAF (paroxysmal atrial fibrillation) ([place])    a. s/p prior DCCV; b. On flecainide & coumadin (CHA2DS2VASc = 4);  c. 03/2016 Echo: EF 55-60%, mod LVH, mod AI, mild to mod TR, PASP 23mHg.   Pelvic fracture ([facility])    Pulmonary fibrosis [place] 2018   [place]    Past Surgical History:  Procedure Laterality Date   BIOPSY BREAST     BIOPSY BREAST     right & benign   BREAST SURGERY     CARDIOVERSION N/A 10/30/2016   Procedure: CARDIOVERSION;  Surgeon: DLarey Dresser MD;  [place] [facility]  Service: Cardiovascular;  Laterality: N/A;   CARDIOVERSION N/A 08/20/2017   Procedure: CARDIOVERSION;  Surgeon: SJerline Pain MD;  [facility]  Service: Cardiovascular;  Laterality: N/A;   CARDIOVERSION N/A 02/02/2020   Procedure: CARDIOVERSION;  Surgeon: OGeralynn Rile MD;  [place] [facility]  Service: Cardiovascular;  Laterality: N/A;   CATARACT EXTRACTION     COLONOSCOPY     EYE SURGERY  cataracts   INGUINAL HERNIA REPAIR Left 07/18/2022   Procedure: LAPAROSCOPIC LEFT INGUINAL HERNIA REPAIR WITH MESH POSSIBLE OPEN;  Surgeon: Ralene Ok, MD;  Location: [place];  Service: General;  Laterality: Left;   TEE WITHOUT CARDIOVERSION N/A 10/30/2016   Procedure: TRANSESOPHAGEAL ECHOCARDIOGRAM (TEE);  Surgeon: Larey Dresser, MD;  Location: [place];  Service: Cardiovascular;  Laterality: N/A;   TOTAL HIP ARTHROPLASTY Right 01/27/2017   Procedure: TOTAL HIP ARTHROPLASTY ANTERIOR APPROACH;  Surgeon: Rod Can, MD;  Location: [place];  Service: Orthopedics;  Laterality: Right;     Current Outpatient Medications  Medication Sig Dispense Refill   apixaban (ELIQUIS) 2.5 MG TABS tablet Take 1 tablet (2.5 mg total) by mouth 2 (two)  times daily. 180 tablet 3   azaTHIOprine (IMURAN) 50 MG tablet Take 50 mg by mouth daily.      Calcium Carbonate-Vitamin D 600-10 MG-MCG TABS Take 1 tablet by mouth 2 (two) times daily.     diltiazem (CARDIZEM CD) 300 MG 24 hr capsule TAKE 1 CAPSULE BY MOUTH DAILY 100 capsule 2   ESBRIET 267 MG TABS Take 3 tablets (801 mg total) by mouth with breakfast, with lunch, and with evening meal. 270 tablet 5   furosemide (LASIX) 20 MG tablet TAKE 1 TO 2 TABLETS BY MOUTH  DAILY AS NEEDED 200 tablet 0   gabapentin (NEURONTIN) 100 MG capsule Take 100 mg by mouth at bedtime.      ketoconazole (NIZORAL) 2 % shampoo Apply 1 Application topically daily as needed for irritation.     levothyroxine (SYNTHROID) 75 MCG tablet Take 1 tablet (75 mcg total) by mouth daily before breakfast. 90 tablet 3   lisinopril (ZESTRIL) 20 MG tablet TAKE 1 TABLET BY MOUTH DAILY 100 tablet 2   lisinopril-hydrochlorothiazide (ZESTORETIC) 20-12.5 MG tablet TAKE 1 TABLET BY MOUTH DAILY IN  THE MORNING 100 tablet 2   Melatonin 5 MG TABS Take 5 mg by mouth at bedtime.     Multiple Vitamin (MULTIVITAMIN) capsule Take 1 capsule by mouth daily.     potassium chloride SA (KLOR-CON M) 20 MEQ tablet TAKE 1 TABLET BY MOUTH DAILY 100 tablet 0   Probiotic Product (PROBIOTIC-10 ULTIMATE PO) Take 1 tablet by mouth daily.     No current facility-administered medications for this visit.    Allergies:   Benazepril hcl    ROS:  Please see the history of present illness.   Otherwise, review of systems are positive for none.   All other systems are reviewed and negative.    PHYSICAL EXAM: VS:  BP 112/66   Pulse 77   Ht 5' (1.524 m)   Wt 121 lb 9.6 oz (55.2 kg)   SpO2 98%   BMI 23.75 kg/m  , BMI Body mass index is 23.75 kg/m. GENERAL:  Well appearing NECK:  No jugular venous distention, waveform within normal limits, carotid upstroke brisk and symmetric, no bruits, no thyromegaly LUNGS:  Clear to auscultation bilaterally CHEST:   Unremarkable HEART:  PMI not displaced or sustained,S1 and S2 within normal limits, no S3, no clicks, no rubs, o murmurs, irregular ABD:  Flat, positive bowel sounds normal in frequency in pitch, no bruits, no rebound, no guarding, no midline pulsatile mass, no hepatomegaly, no splenomegaly EXT:  2 plus pulses throughout, no edema, no cyanosis no clubbing   EKG:  EKG is not ordered today.   Recent Labs: 04/05/2022: Magnesium 1.8 09/20/2022: ALT 10; BUN 25; Creatinine, Ser 1.12; NT-Pro BNP 1,774; Potassium 4.3; Sodium  139 10/14/2022: Hemoglobin 13.2; Platelets 141; TSH 1.560    Lipid Panel    Component Value Date/Time   CHOL 172 09/15/2020 0845   CHOL 131 04/14/2013 0836   TRIG 59 09/15/2020 0845   TRIG 67 09/15/2017 0845   TRIG 69 04/14/2013 0836   HDL 79 09/15/2020 0845   HDL 71 09/15/2017 0845   HDL 53 04/14/2013 0836   CHOLHDL 2.2 09/15/2020 0845   LDLCALC 81 09/15/2020 0845   LDLCALC 64 04/14/2013 0836   LDLDIRECT 98 04/20/2014 1015      Wt Readings from Last 3 Encounters:  10/23/22 121 lb 9.6 oz (55.2 kg)  10/14/22 123 lb 4.8 oz (55.9 kg)  09/20/22 120 lb 12.8 oz (54.8 kg)      Other studies Reviewed: Additional studies/ records that were reviewed today include: Labs Review of the above records demonstrates:   See elsewhere  ASSESSMENT AND PLAN:  HTN:    Her BP is well-controlled.  I reviewed her blood pressure diary.  No change in therapy.   ATRIAL FIB:   Susan Fitzgerald has a CHA2DS2 - VASc score of 4.  No change in therapy.  She tolerates anticoagulation.  DYSPNEA: This seems to be multifactorial.  I did review with her in detail the BNP level.  She does not like taking more diuretic.  Her blood pressure is well-controlled.  She is not having any new symptoms.  I think that we would not manage the BNP but rather symptoms and she understands the concept.  No change in diuretic.   Current medicines are reviewed at length with the patient today.  The patient  does not have concerns regarding medicines.  The following changes have been made: None  Labs/ tests ordered today include:   None  No orders of the defined types were placed in this encounter.     Disposition:   FU with me in 6 months.     Signed, Minus Breeding, MD  10/23/2022 4:01 PM    [facility]

## 2022-10-23 ENCOUNTER — Ambulatory Visit: Payer: Medicare Other | Admitting: Cardiology

## 2022-10-23 ENCOUNTER — Encounter: Payer: Self-pay | Admitting: Cardiology

## 2022-10-23 VITALS — BP 112/66 | HR 77 | Ht 60.0 in | Wt 121.6 lb

## 2022-10-23 DIAGNOSIS — I482 Chronic atrial fibrillation, unspecified: Secondary | ICD-10-CM | POA: Diagnosis not present

## 2022-10-23 DIAGNOSIS — I10 Essential (primary) hypertension: Secondary | ICD-10-CM | POA: Diagnosis not present

## 2022-10-23 DIAGNOSIS — R0602 Shortness of breath: Secondary | ICD-10-CM

## 2022-10-23 NOTE — Patient Instructions (Signed)
Medication Instructions:  The current medical regimen is effective;  continue present plan and medications.  *If you need a refill on your cardiac medications before your next appointment, please call your pharmacy*  Follow-Up: At [facility], you and your health needs are our priority.  As part of our continuing mission to provide you with exceptional heart care, we have created designated Provider Care Teams.  These Care Teams include your primary Cardiologist (physician) and Advanced Practice Providers (APPs -  Physician Assistants and Nurse Practitioners) who all work together to provide you with the care you need, when you need it.  We recommend signing up for the patient portal called "MyChart".  Sign up information is provided on this After Visit Summary.  MyChart is used to connect with patients for Virtual Visits (Telemedicine).  Patients are able to view lab/test results, encounter notes, upcoming appointments, etc.  Non-urgent messages can be sent to your provider as well.   To learn more about what you can do with MyChart, go to [email or web address].    Your next appointment:   6 month(s)  The format for your next appointment:   In Person  Provider:   James Hochrein, MD     Important Information About Sugar       

## 2022-11-01 DIAGNOSIS — Z6822 Body mass index (BMI) 22.0-22.9, adult: Secondary | ICD-10-CM | POA: Diagnosis not present

## 2022-11-01 DIAGNOSIS — M1991 Primary osteoarthritis, unspecified site: Secondary | ICD-10-CM | POA: Diagnosis not present

## 2022-11-01 DIAGNOSIS — M65331 Trigger finger, right middle finger: Secondary | ICD-10-CM | POA: Diagnosis not present

## 2022-11-01 DIAGNOSIS — R7989 Other specified abnormal findings of blood chemistry: Secondary | ICD-10-CM | POA: Diagnosis not present

## 2022-11-01 DIAGNOSIS — Z79899 Other long term (current) drug therapy: Secondary | ICD-10-CM | POA: Diagnosis not present

## 2022-11-01 DIAGNOSIS — J849 Interstitial pulmonary disease, unspecified: Secondary | ICD-10-CM | POA: Diagnosis not present

## 2022-11-01 DIAGNOSIS — M5030 Other cervical disc degeneration, unspecified cervical region: Secondary | ICD-10-CM | POA: Diagnosis not present

## 2022-11-01 DIAGNOSIS — M0579 Rheumatoid arthritis with rheumatoid factor of multiple sites without organ or systems involvement: Secondary | ICD-10-CM | POA: Diagnosis not present

## 2022-11-01 DIAGNOSIS — M5136 Other intervertebral disc degeneration, lumbar region: Secondary | ICD-10-CM | POA: Diagnosis not present

## 2022-11-01 DIAGNOSIS — M25512 Pain in left shoulder: Secondary | ICD-10-CM | POA: Diagnosis not present

## 2022-11-05 ENCOUNTER — Ambulatory Visit (INDEPENDENT_AMBULATORY_CARE_PROVIDER_SITE_OTHER): Payer: Medicare Other

## 2022-11-05 VITALS — Ht 60.0 in | Wt 119.0 lb

## 2022-11-05 DIAGNOSIS — Z0000 Encounter for general adult medical examination without abnormal findings: Secondary | ICD-10-CM

## 2022-11-05 NOTE — Progress Notes (Signed)
Subjective:   Susan Fitzgerald is a 88 y.o. female who presents for Medicare Annual (Subsequent) preventive examination. I connected with  [name] on 11/05/22 by a audio enabled telemedicine application and verified that I am speaking with the correct person using two identifiers.  Patient Location: Home  Provider Location: Home Office  I discussed the limitations of evaluation and management by telemedicine. The patient expressed understanding and agreed to proceed.  Review of Systems     Cardiac Risk Factors include: advanced age (>29mn, >>70women);diabetes mellitus     Objective:    Today's Vitals   11/05/22 1119  Weight: 119 lb (54 kg)  Height: 5' (1.524 m)   Body mass index is 23.24 kg/m.     11/05/2022   11:24 AM 07/18/2022    9:13 AM 02/25/2022   10:54 AM 11/02/2021   11:32 AM 08/15/2021    9:10 AM 05/02/2021    8:44 AM 04/11/2021    8:48 AM  Advanced Directives  Does Patient Have a Medical Advance Directive? Yes Yes No No Yes Yes Yes  Type of AParamedicof [place]app[place]Living will HZeph[place]hills SouthLiving will   [place]Living will Living will;Healthcare Power of [place] will  Does patient want to make changes to medical advance directive?     No - Patient declined No - Patient declined No - Patient declined  Copy of [place] Chart? No - copy requested    No - copy requested No - copy requested No - copy requested  Would patient like information on creating a medical advance directive?   No - Patient declined No - Patient declined No - Patient declined      Current Medications (verified) Outpatient Encounter Medications as of 11/05/2022  Medication Sig   apixaban (ELIQUIS) 2.5 MG TABS tablet Take 1 tablet (2.5 mg total) by mouth 2 (two) times daily.   azaTHIOprine (IMURAN) 50 MG tablet Take 50 mg by mouth daily.    Calcium Carbonate-Vitamin D  600-10 MG-MCG TABS Take 1 tablet by mouth 2 (two) times daily.   diltiazem (CARDIZEM CD) 300 MG 24 hr capsule TAKE 1 CAPSULE BY MOUTH DAILY   ESBRIET 267 MG TABS Take 3 tablets (801 mg total) by mouth with breakfast, with lunch, and with evening meal.   furosemide (LASIX) 20 MG tablet TAKE 1 TO 2 TABLETS BY MOUTH  DAILY AS NEEDED   gabapentin (NEURONTIN) 100 MG capsule Take 100 mg by mouth at bedtime.    ketoconazole (NIZORAL) 2 % shampoo Apply 1 Application topically daily as needed for irritation.   levothyroxine (SYNTHROID) 75 MCG tablet Take 1 tablet (75 mcg total) by mouth daily before breakfast.   lisinopril (ZESTRIL) 20 MG tablet TAKE 1 TABLET BY MOUTH DAILY   lisinopril-hydrochlorothiazide (ZESTORETIC) 20-12.5 MG tablet TAKE 1 TABLET BY MOUTH DAILY IN  THE MORNING   Melatonin 5 MG TABS Take 5 mg by mouth at bedtime.   Multiple Vitamin (MULTIVITAMIN) capsule Take 1 capsule by mouth daily.   potassium chloride SA (KLOR-CON M) 20 MEQ tablet TAKE 1 TABLET BY MOUTH DAILY   Probiotic Product (PROBIOTIC-10 ULTIMATE PO) Take 1 tablet by mouth daily.   No facility-administered encounter medications on file as of 11/05/2022.    Allergies (verified) Benazepril hcl   History: Past Medical History:  Diagnosis Date   Calcification of bronchial airway 06/23/2018   01/2017 - See on CT imaging  of the chest    Cataract    Cerebral vascular disease 06/19/2018   06/19/2018 - MRI with chronic microvascular ischemic change   Diverticulosis    Hypertension    Osteoporosis    PAF (paroxysmal atrial fibrillation) ([place])    a. s/p prior DCCV; b. On flecainide & coumadin (CHA2DS2VASc = 4);  c. 03/2016 Echo: EF 55-60%, mod LVH, mod AI, mild to mod TR, PASP 86mHg.   Pelvic fracture ([facility])    Pulmonary fibrosis [place]   Rheumatoid arthritis ([place] 2017   Past Surgical History:  Procedure Laterality Date   BIOPSY BREAST     BIOPSY BREAST     right & benign   BREAST SURGERY     CARDIOVERSION N/A  10/30/2016   Procedure: CARDIOVERSION;  Surgeon: DLarey Dresser MD;  [facility]  Service: Cardiovascular;  Laterality: N/A;   CARDIOVERSION N/A 08/20/2017   Procedure: CARDIOVERSION;  Surgeon: SJerline Pain MD;  [facility];  Service: Cardiovascular;  Laterality: N/A;   CARDIOVERSION N/A 02/02/2020   Procedure: CARDIOVERSION;  Surgeon: OGeralynn Rile MD;  [place] [facility]  Service: Cardiovascular;  Laterality: N/A;   CATARACT EXTRACTION     COLONOSCOPY     EYE SURGERY     cataracts   INGUINAL HERNIA REPAIR Left 07/18/2022   Procedure: LAPAROSCOPIC LEFT INGUINAL HERNIA REPAIR WITH MESH POSSIBLE OPEN;  Surgeon: RRalene Ok MD;  [place] [facility]  Service: General;  Laterality: Left;   TEE WITHOUT CARDIOVERSION N/A 10/30/2016   Procedure: TRANSESOPHAGEAL ECHOCARDIOGRAM (TEE);  Surgeon: DLarey Dresser MD;  [place] [facility]  Service: Cardiovascular;  Laterality: N/A;   TOTAL HIP ARTHROPLASTY Right 01/27/2017   Procedure: TOTAL HIP ARTHROPLASTY ANTERIOR APPROACH;  Surgeon: BRod Can MD;  [place] [facility]  Service: Orthopedics;  Laterality: Right;   Family History  Problem Relation Age of Onset   Stroke Mother        cerebral hemorrhage   Heart disease Father        MI   Heart attack Father    Vision loss Father    Heart disease Brother    Heart attack Brother    Cancer Maternal Aunt        breast   Cancer Brother    Heart disease Brother    Cancer Daughter        breast   Heart disease Son    Colon cancer Neg Hx    Colon polyps Neg Hx    Rectal cancer Neg Hx    Stomach cancer Neg Hx    Social History   Socioeconomic History   Marital status: Married    Spouse name: BEngineer, technical sales   Number of children: 2   Years of education: 12   Highest education level: Not on file  Occupational History   Occupation: RETIRED  Tobacco Use   Smoking status: Never   Smokeless tobacco: Never  Vaping Use   Vaping Use: Never used  Substance  and Sexual Activity   Alcohol use: No   Drug use: No   Sexual activity: Yes    Birth control/protection: Post-menopausal  Other Topics Concern   Not on file  Social History Narrative   Lives with her husband.   Enjoys playing golf.   Still very active and independent   Social Determinants of Health   Financial Resource Strain: Low Risk  (11/05/2022)   Overall Financial Resource Strain (CARDIA)    Difficulty of Paying Living Expenses:  Not hard at all  Food Insecurity: No Food Insecurity (11/05/2022)   Hunger Vital Sign    Worried About Running Out of Food in the Last Year: Never true    Ran Out of Food in the Last Year: Never true  Transportation Needs: No Transportation Needs (11/05/2022)   PRAPARE - Hydrologist (Medical): No    Lack of Transportation (Non-Medical): No  Physical Activity: Sufficiently Active (11/05/2022)   Exercise Vital Sign    Days of Exercise per Week: 5 days    Minutes of Exercise per Session: 30 min  Stress: No Stress Concern Present (11/05/2022)   [place]    Feeling of Stress : Not at all  Social Connections: [place] (11/05/2022)   Social Connection and Isolation Panel [NHANES]    Frequency of Communication with Friends and Family: More than three times a week    Frequency of Social Gatherings with Friends and Family: More than three times a week    Attends Religious Services: More than 4 times per year    Active Member of [employer] or Organizations: Yes    Attends Music therapist: More than 4 times per year    Marital Status: Married    Tobacco Counseling Counseling given: Not Answered   Clinical Intake:  Pre-visit preparation completed: Yes  Pain : No/denies pain     Nutritional Risks: None Diabetes: No  How often do you need to have someone help you when you read instructions, pamphlets, or other written materials  from your doctor or pharmacy?: 1 - Never  Diabetic?no   Interpreter Needed?: No  Information entered by :: Jadene Pierini, LPN   Activities of Daily Living    11/05/2022   11:23 AM 07/18/2022    9:36 AM  In your present state of health, do you have any difficulty performing the following activities:  Hearing? 0   Vision? 0   Difficulty concentrating or making decisions? 0   Walking or climbing stairs? 0   Dressing or bathing? 0   Doing errands, shopping? 0 0  Preparing Food and eating ? N   Using the Toilet? N   In the past six months, have you accidently leaked urine? N   Do you have problems with loss of bowel control? N   Managing your Medications? N   Managing your Finances? N   Housekeeping or managing your Housekeeping? N     Patient Care Team: [name], DO as PCP - General (Family Medicine) Minus Breeding, MD as PCP - Cardiology (Cardiology) Druscilla Brownie, MD as Consulting Physician (Dermatology) Hennie Duos, MD as Consulting Physician (Rheumatology) Marlaine Hind, MD as Consulting Physician (Physical Medicine and Rehabilitation) Lavera Guise, [facility] as Pharmacist (Family Medicine)  Indicate any recent Medical Services you may have received from other than [facility] providers in the past year (date may be approximate).     Assessment:   This is a routine wellness examination for Britnee.  Hearing/Vision screen Vision Screening - Comments:: Wears rx glasses - up to date with routine eye exams with  Dr.Lee  Dietary issues and exercise activities discussed: Current Exercise Habits: Home exercise routine, Type of exercise: walking, Time (Minutes): 30, Frequency (Times/Week): 5, Weekly Exercise (Minutes/Week): 150, Intensity: Mild, Exercise limited by: None identified   Goals Addressed             This Visit's Progress    Have 3  meals a day   On track    Try to have 3 balanced meals per day including a lean protein source, 2 sources of  carbohydrates (whole grains or fruits are good options), and a non-starchy vegetable.  Ensure or Boost are good for supplements as needed.      Patient Stated   On track    11/01/2020 AWV Goal: Fall Prevention  Over the next year, patient will decrease their risk for falls by: Using assistive devices, such as a cane or walker, as needed Identifying fall risks within their home and correcting them by: Removing throw rugs Adding handrails to stairs or ramps Removing clutter and keeping a clear pathway throughout the home Increasing light, especially at night Adding shower handles/bars Raising toilet seat Identifying potential personal risk factors for falls: Medication side effects Incontinence/urgency Vestibular dysfunction Hearing loss Musculoskeletal disorders Neurological disorders Orthostatic hypotension         Depression Screen    11/05/2022   11:22 AM 11/05/2022   11:21 AM 10/14/2022   11:37 AM 04/16/2022   10:40 AM 04/05/2022   10:26 AM 03/06/2022    8:05 AM 01/04/2022   11:27 AM  PHQ 2/9 Scores  PHQ - 2 Score 0 0 0 0 0 0 0  PHQ- 9 Score     '3 4 3    '$ Fall Risk    11/05/2022   11:20 AM 10/14/2022   11:37 AM 04/16/2022   10:40 AM 04/05/2022   10:26 AM 03/06/2022    8:05 AM  Fall Risk   Falls in the past year? 0 0 0 0 0  Number falls in past yr: 0      Injury with Fall? 0      Risk for fall due to : No Fall Risks      Follow up Falls prevention discussed        [place]:  Any stairs in or around the home? Yes  If so, are there any without handrails? No  Home free of loose throw rugs in walkways, pet beds, electrical cords, etc? Yes  Adequate lighting in your home to reduce risk of falls? Yes   ASSISTIVE DEVICES UTILIZED TO PREVENT FALLS:  Life alert? No  Use of a cane, walker or w/c? No  Grab bars in the bathroom? No  Shower chair or bench in shower? No  Elevated toilet seat or a handicapped toilet? No        10/28/2018    1:17 PM 05/07/2016    8:22 AM 04/10/2015    3:41 PM  MMSE - Mini Mental State Exam  Orientation to time '5 4 5  '$ Orientation to Place '5 5 5  '$ Registration '3 3 3  '$ Attention/ Calculation '5 4 5  '$ Recall '3 3 3  '$ Language- name 2 objects '2 2 2  '$ Language- repeat '1 1 1  '$ Language- follow 3 step command '3 1 3  '$ Language- read & follow direction '1 1 1  '$ Write a sentence '1 1 1  '$ Copy design 1  1  Total score 30  30        11/05/2022   11:24 AM 11/05/2022   11:23 AM 11/02/2021   11:31 AM 11/01/2020   10:37 AM 11/01/2019   10:54 AM  6CIT Screen  What Year? 0 points 0 points 0 points 0 points 0 points  What month? 0 points 0 points 0 points 0 points 0 points  What time? 0  points 0 points 0 points 0 points 0 points  Count back from 20 0 points 0 points 0 points 0 points 0 points  Months in reverse 0 points 0 points 0 points 0 points 0 points  Repeat phrase 0 points 0 points 0 points 0 points 2 points  Total Score 0 points 0 points 0 points 0 points 2 points    Immunizations Immunization History  Administered Date(s) Administered   Fluad Quad(high Dose 65+) 07/20/2019, 08/23/2020, 08/13/2021, 09/11/2022   Influenza, High Dose Seasonal PF 09/20/2016, 09/19/2017, 08/25/2018   Influenza,inj,Quad PF,6+ Mos 08/23/2013, 09/05/2014, 08/28/2015   Moderna SARS-COV2 Booster Vaccination 09/08/2020   PFIZER(Purple Top)SARS-COV-2 Vaccination 12/24/2019, 01/19/2020   Pfizer Covid-19 Vaccine Bivalent Booster 5y-11y 08/20/2021   Pneumococcal Conjugate-13 09/05/2014   Pneumococcal Polysaccharide-23 10/28/2018   Respiratory Syncytial Virus Vaccine,Recomb Aduvanted(Arexvy) 09/16/2022   Td 09/02/2011   Tdap 09/02/2011   Zoster Recombinat (Shingrix) 11/13/2018, 03/15/2019    TDAP status: Up to date  Flu Vaccine status: Up to date  Pneumococcal vaccine status: Up to date  Covid-19 vaccine status: Completed vaccines  Qualifies for Shingles Vaccine? Yes   Zostavax completed Yes   Shingrix  Completed?: Yes  Screening Tests Health Maintenance  Topic Date Due   DTaP/Tdap/Td (3 - Td or Tdap) 09/01/2021   COVID-19 Vaccine (4 - 2023-24 season) 07/19/2022   Medicare Annual Wellness (AWV)  11/06/2023   Pneumonia Vaccine 3+ Years old  Completed   INFLUENZA VACCINE  Completed   DEXA SCAN  Completed   Zoster Vaccines- Shingrix  Completed   HPV VACCINES  Aged Out    Health Maintenance  Health Maintenance Due  Topic Date Due   DTaP/Tdap/Td (3 - Td or Tdap) 09/01/2021   COVID-19 Vaccine (4 - 2023-24 season) 07/19/2022    Colorectal cancer screening: No longer required.   Mammogram status: No longer required due to age.  Bone Density status: Completed 01/30/2021. Results reflect: Bone density results: OSTEOPENIA. Repeat every 5 years.  Lung Cancer Screening: (Low Dose CT Chest recommended if Age 45-80 years, 30 pack-year currently smoking OR have quit w/in 15years.) does not qualify.   Lung Cancer Screening Referral: n/a  Additional Screening:  Hepatitis C Screening: does not qualify;   Vision Screening: Recommended annual ophthalmology exams for early detection of glaucoma and other disorders of the eye. Is the patient up to date with their annual eye exam?  Yes  Who is the provider or what is the name of the office in which the patient attends annual eye exams? Dr.Lee  If pt is not established with a provider, would they like to be referred to a provider to establish care? No .   Dental Screening: Recommended annual dental exams for proper oral hygiene  Community Resource Referral / Chronic Care Management: CRR required this visit?  No   CCM required this visit?  No      Plan:     I have personally reviewed and noted the following in the patient's chart:   Medical and social history Use of alcohol, tobacco or illicit drugs  Current medications and supplements including opioid prescriptions. Patient is not currently taking opioid prescriptions. Functional  ability and status Nutritional status Physical activity Advanced directives List of other physicians Hospitalizations, surgeries, and ER visits in previous 12 months Vitals Screenings to include cognitive, depression, and falls Referrals and appointments  In addition, I have reviewed and discussed with patient certain preventive protocols, quality metrics, and best practice recommendations. A written personalized care  plan for preventive services as well as general preventive health recommendations were provided to patient.     Daphane Shepherd, LPN   19/37/9024   Nurse Notes: none

## 2022-11-05 NOTE — Patient Instructions (Signed)
Susan Fitzgerald , Thank you for taking time to come for your Medicare Wellness Visit. I appreciate your ongoing commitment to your health goals. Please review the following plan we discussed and let me know if I can assist you in the future.   These are the goals we discussed:  Goals       AFIB (pt-stated)      Current Barriers:  Unable to independently afford treatment regimen  Pharmacist Clinical Goal(s):  Over the next 90 days, patient will verbalize ability to afford treatment regimen through collaboration with PharmD and provider.   Interventions: 1:1 collaboration with [name], DO regarding development and update of comprehensive plan of care as evidenced by provider attestation and co-signature Inter-disciplinary care team collaboration (see longitudinal plan of care) Comprehensive medication review performed; medication list updated in electronic medical record  Atrial Fibrillation: Controlled; current rate/rhythm control: dilt; anticoagulant treatment: ELIQUIS Patient was getting through [facility] in 2022, however they stated they were no longer servicing the patient Call made to [employer] patient assistance program for eliquis--need to send provider portion, will fax next week when in office Patient will have to meet the 3% out of pocket again for 2023--has not met yet, pt aware CHADS2VASc score: 5 Home blood pressure, heart rate readings: 110s/60-70s Denies signs & symptoms of bleeding Active/plays golf  Patient Goals/Self-Care Activities Over the next 90 days, patient will:  - take medications as prescribed  Follow Up Plan: The patient will call PharmD* as advised to when she mets out of pocket.         Have 3 meals a day      Try to have 3 balanced meals per day including a lean protein source, 2 sources of carbohydrates (whole grains or fruits are good options), and a non-starchy vegetable.  Ensure or Boost are good for supplements as needed.        LIFESTYLE - DECREASE FALLS RISK (pt-stated)      Keep active and safe, while being very careful      Patient Stated      11/01/2020 AWV Goal: Fall Prevention  Over the next year, patient will decrease their risk for falls by: Using assistive devices, such as a cane or walker, as needed Identifying fall risks within their home and correcting them by: Removing throw rugs Adding handrails to stairs or ramps Removing clutter and keeping a clear pathway throughout the home Increasing light, especially at night Adding shower handles/bars Raising toilet seat Identifying potential personal risk factors for falls: Medication side effects Incontinence/urgency Vestibular dysfunction Hearing loss Musculoskeletal disorders Neurological disorders Orthostatic hypotension          This is a list of the screening recommended for you and due dates:  Health Maintenance  Topic Date Due   DTaP/Tdap/Td vaccine (3 - Td or Tdap) 09/01/2021   COVID-19 Vaccine (4 - 2023-24 season) 07/19/2022   Medicare Annual Wellness Visit  11/06/2023   Pneumonia Vaccine  Completed   Flu Shot  Completed   DEXA scan (bone density measurement)  Completed   Zoster (Shingles) Vaccine  Completed   HPV Vaccine  Aged Out    Advanced directives: Please bring a copy of your health care power of attorney and living will to the office to be added to your chart at your convenience.   Conditions/risks identified: Aim for 30 minutes of exercise or brisk walking, 6-8 glasses of water, and 5 servings of fruits and vegetables each day.  Next appointment: Follow up in one year for your annual wellness visit    Preventive Care 65 Years and Older, Female Preventive care refers to lifestyle choices and visits with your health care provider that can promote health and wellness. What does preventive care include? A yearly physical exam. This is also called an annual well check. Dental exams once or twice a year. Routine  eye exams. Ask your health care provider how often you should have your eyes checked. Personal lifestyle choices, including: Daily care of your teeth and gums. Regular physical activity. Eating a healthy diet. Avoiding tobacco and drug use. Limiting alcohol use. Practicing safe sex. Taking low-dose aspirin every day. Taking vitamin and mineral supplements as recommended by your health care provider. What happens during an annual well check? The services and screenings done by your health care provider during your annual well check will depend on your age, overall health, lifestyle risk factors, and family history of disease. Counseling  Your health care provider may ask you questions about your: Alcohol use. Tobacco use. Drug use. Emotional well-being. Home and relationship well-being. Sexual activity. Eating habits. History of falls. Memory and ability to understand (cognition). Work and work Statistician. Reproductive health. Screening  You may have the following tests or measurements: Height, weight, and BMI. Blood pressure. Lipid and cholesterol levels. These may be checked every 5 years, or more frequently if you are over 29 years old. Skin check. Lung cancer screening. You may have this screening every year starting at age 71 if you have a 30-pack-year history of smoking and currently smoke or have quit within the past 15 years. Fecal occult blood test (FOBT) of the stool. You may have this test every year starting at age 70. Flexible sigmoidoscopy or colonoscopy. You may have a sigmoidoscopy every 5 years or a colonoscopy every 10 years starting at age 25. Hepatitis C blood test. Hepatitis B blood test. Sexually transmitted disease (STD) testing. Diabetes screening. This is done by checking your blood sugar (glucose) after you have not eaten for a while (fasting). You may have this done every 1-3 years. Bone density scan. This is done to screen for osteoporosis. You may  have this done starting at age 46. Mammogram. This may be done every 1-2 years. Talk to your health care provider about how often you should have regular mammograms. Talk with your health care provider about your test results, treatment options, and if necessary, the need for more tests. Vaccines  Your health care provider may recommend certain vaccines, such as: Influenza vaccine. This is recommended every year. Tetanus, diphtheria, and acellular pertussis (Tdap, Td) vaccine. You may need a Td booster every 10 years. Zoster vaccine. You may need this after age 41. Pneumococcal 13-valent conjugate (PCV13) vaccine. One dose is recommended after age 37. Pneumococcal polysaccharide (PPSV23) vaccine. One dose is recommended after age 57. Talk to your health care provider about which screenings and vaccines you need and how often you need them. This information is not intended to replace advice given to you by your health care provider. Make sure you discuss any questions you have with your health care provider. Document Released: 12/01/2015 Document Revised: 07/24/2016 Document Reviewed: 09/05/2015 Elsevier Interactive Patient Education  2017 [place] Prevention in the Home Falls can cause injuries. They can happen to people of all ages. There are many things you can do to make your home safe and to help prevent falls. What can I do on the outside of  my home? Regularly fix the edges of walkways and driveways and fix any cracks. Remove anything that might make you trip as you walk through a door, such as a raised step or threshold. Trim any bushes or trees on the path to your home. Use bright outdoor lighting. Clear any walking paths of anything that might make someone trip, such as rocks or tools. Regularly check to see if handrails are loose or broken. Make sure that both sides of any steps have handrails. Any raised decks and porches should have guardrails on the edges. Have any  leaves, snow, or ice cleared regularly. Use sand or salt on walking paths during winter. Clean up any spills in your garage right away. This includes oil or grease spills. What can I do in the bathroom? Use night lights. Install grab bars by the toilet and in the tub and shower. Do not use towel bars as grab bars. Use non-skid mats or decals in the tub or shower. If you need to sit down in the shower, use a plastic, non-slip stool. Keep the floor dry. Clean up any water that spills on the floor as soon as it happens. Remove soap buildup in the tub or shower regularly. Attach bath mats securely with double-sided non-slip rug tape. Do not have throw rugs and other things on the floor that can make you trip. What can I do in the bedroom? Use night lights. Make sure that you have a light by your bed that is easy to reach. Do not use any sheets or blankets that are too big for your bed. They should not hang down onto the floor. Have a firm chair that has side arms. You can use this for support while you get dressed. Do not have throw rugs and other things on the floor that can make you trip. What can I do in the kitchen? Clean up any spills right away. Avoid walking on wet floors. Keep items that you use a lot in easy-to-reach places. If you need to reach something above you, use a strong step stool that has a grab bar. Keep electrical cords out of the way. Do not use floor polish or wax that makes floors slippery. If you must use wax, use non-skid floor wax. Do not have throw rugs and other things on the floor that can make you trip. What can I do with my stairs? Do not leave any items on the stairs. Make sure that there are handrails on both sides of the stairs and use them. Fix handrails that are broken or loose. Make sure that handrails are as long as the stairways. Check any carpeting to make sure that it is firmly attached to the stairs. Fix any carpet that is loose or worn. Avoid  having throw rugs at the top or bottom of the stairs. If you do have throw rugs, attach them to the floor with carpet tape. Make sure that you have a light switch at the top of the stairs and the bottom of the stairs. If you do not have them, ask someone to add them for you. What else can I do to help prevent falls? Wear shoes that: Do not have high heels. Have rubber bottoms. Are comfortable and fit you well. Are closed at the toe. Do not wear sandals. If you use a stepladder: Make sure that it is fully opened. Do not climb a closed stepladder. Make sure that both sides of the stepladder are locked into place. Ask  someone to hold it for you, if possible. Clearly mark and make sure that you can see: Any grab bars or handrails. First and last steps. Where the edge of each step is. Use tools that help you move around (mobility aids) if they are needed. These include: Canes. Walkers. Scooters. Crutches. Turn on the lights when you go into a dark area. Replace any light bulbs as soon as they burn out. Set up your furniture so you have a clear path. Avoid moving your furniture around. If any of your floors are uneven, fix them. If there are any pets around you, be aware of where they are. Review your medicines with your doctor. Some medicines can make you feel dizzy. This can increase your chance of falling. Ask your doctor what other things that you can do to help prevent falls. This information is not intended to replace advice given to you by your health care provider. Make sure you discuss any questions you have with your health care provider. Document Released: 08/31/2009 Document Revised: 04/11/2016 Document Reviewed: 12/09/2014 Elsevier Interactive Patient Education  2017 [employer].

## 2022-11-26 ENCOUNTER — Ambulatory Visit (HOSPITAL_COMMUNITY): Payer: Medicare Other | Admitting: Physical Therapy

## 2022-12-06 DIAGNOSIS — L820 Inflamed seborrheic keratosis: Secondary | ICD-10-CM | POA: Diagnosis not present

## 2022-12-06 DIAGNOSIS — D485 Neoplasm of uncertain behavior of skin: Secondary | ICD-10-CM | POA: Diagnosis not present

## 2022-12-06 DIAGNOSIS — C44722 Squamous cell carcinoma of skin of right lower limb, including hip: Secondary | ICD-10-CM | POA: Diagnosis not present

## 2022-12-11 ENCOUNTER — Telehealth: Payer: Self-pay

## 2022-12-11 NOTE — Telephone Encounter (Signed)
Received notification from [facility] ([place]) regarding patient assistance DENIAL for ELIQUIS.  3% OF OUT OF POCKET PRESCRIPTION EXPENSES NOT MET FOR 2024  Phone:[phone]

## 2022-12-13 ENCOUNTER — Other Ambulatory Visit: Payer: Self-pay

## 2022-12-13 ENCOUNTER — Ambulatory Visit (HOSPITAL_COMMUNITY): Payer: Medicare Other | Attending: Family Medicine | Admitting: Physical Therapy

## 2022-12-13 DIAGNOSIS — N3941 Urge incontinence: Secondary | ICD-10-CM | POA: Diagnosis present

## 2022-12-13 NOTE — Therapy (Signed)
OUTPATIENT PHYSICAL THERAPY FEMALE PELVIC EVALUATION   Patient Name: Susan Fitzgerald MRN: [id number] DOB:02/20/1932, 89 y.o., female Today's Date: 12/13/2022  END OF SESSION:  PT End of Session - 12/13/22 1100    Visit Number 1    Number of Visits 4    Date for PT Re-Evaluation 02/10/23    Authorization Type UHC Medicare    Progress Note Due on Visit 4    PT Start Time 1020    PT Stop Time 1100    PT Time Calculation (min) 40 min    Activity Tolerance Patient tolerated treatment well    Behavior During Therapy WFL for tasks assessed/performed             Past Medical History:  Diagnosis Date   Calcification of bronchial airway 06/23/2018   01/2017 - See on CT imaging of the chest    Cataract    Cerebral vascular disease 06/19/2018   06/19/2018 - MRI with chronic microvascular ischemic change   Diverticulosis    Hypertension    Osteoporosis    PAF (paroxysmal atrial fibrillation) ([place])    a. s/p prior DCCV; b. On flecainide & coumadin (CHA2DS2VASc = 4);  c. 03/2016 Echo: EF 55-60%, mod LVH, mod AI, mild to mod TR, PASP 61mHg.   Pelvic fracture ([facility])    Pulmonary fibrosis ([facility] 2018   Rheumatoid arthritis ([facility] 2017   Past Surgical History:  Procedure Laterality Date   BIOPSY BREAST     BIOPSY BREAST     right & benign   BREAST SURGERY     CARDIOVERSION N/A 10/30/2016   Procedure: CARDIOVERSION;  Surgeon: DLarey Dresser MD;  [facility]  Service: Cardiovascular;  Laterality: N/A;   CARDIOVERSION N/A 08/20/2017   Procedure: CARDIOVERSION;  Surgeon: SJerline Pain MD;  [facility];  Service: Cardiovascular;  Laterality: N/A;   CARDIOVERSION N/A 02/02/2020   Procedure: CARDIOVERSION;  Surgeon: OGeralynn Rile MD;  [facility]  Service: Cardiovascular;  Laterality: N/A;   CATARACT EXTRACTION     COLONOSCOPY     EYE SURGERY     cataracts   INGUINAL HERNIA REPAIR Left 07/18/2022   Procedure: LAPAROSCOPIC LEFT INGUINAL HERNIA  REPAIR WITH MESH POSSIBLE OPEN;  Surgeon: RRalene Ok MD;  [facility]  Service: General;  Laterality: Left;   TEE WITHOUT CARDIOVERSION N/A 10/30/2016   Procedure: TRANSESOPHAGEAL ECHOCARDIOGRAM (TEE);  Surgeon: DLarey Dresser MD;  [facility]  Service: Cardiovascular;  Laterality: N/A;   TOTAL HIP ARTHROPLASTY Right 01/27/2017   Procedure: TOTAL HIP ARTHROPLASTY ANTERIOR APPROACH;  Surgeon: BRod Can MD;  [facility]  Service: Orthopedics;  Laterality: Right;   Patient Active Problem List   Diagnosis Date Noted   Educated about COVID-19 virus infection 12/20/2019   Medication management 10/05/2018   Cough 09/30/2018   Calcification of bronchial airway 06/23/2018   Right ventricular enlargement 06/23/2018   Interstitial pulmonary disease ([facility] 06/23/2018   Elevated liver enzymes 06/23/2018   Cerebral vascular disease 06/19/2018   New onset of confusion 06/08/2018   Weakness 02/25/2018   Hypothyroidism 09/18/2017   Spinal stenosis of lumbar region with neurogenic claudication 03/20/2017   Atrial fibrillation, persistent ([facility] 02/10/2017   Avascular necrosis of bone of right hip ([facility] 01/27/2017   Abdominal aortic atherosclerosis ([facility] 09/24/2016   Rheumatoid arthritis ([facility] 05/07/2016   Symptomatic bradycardia 06/02/2013   Essential hypertension 04/21/2013   Osteoporosis 04/21/2013   Atrial fibrillation, chronic ([facility] 02/09/2013  PCP: Erby Pian  REFERRING PROVIDER: [name], DO  REFERRING DIAG: N39.41 (ICD-10-CM) - Urge incontinence  THERAPY DIAG:  urge incontinence  Rationale for Evaluation and Treatment: Rehabilitation  ONSET DATE: chronic for years.  SUBJECTIVE:                                                                                                                                                                                           SUBJECTIVE STATEMENT: Ms. Waltermire states it's hard to say when her  problem started.  She wears a pad all the time.  She takes lasix which seems to increase the problem.  If she does not take lasix the problem is better.   Fluid intake: Yes: Pt states that she probably drinks less than she should but her Cardiologist told her to limit her water intake.      PAIN:  Are you having pain? No  PRECAUTIONS: None  WEIGHT BEARING RESTRICTIONS: No  FALLS:  Has patient fallen in last 6 months? No  LIVING ENVIRONMENT: Lives with: lives with their family Lives in: House/apartment Has following equipment at home: None  OCCUPATION: retired   PLOF: Independent  PATIENT GOALS: less leakage   PERTINENT HISTORY:  See above   URINATION: Pain with urination: No Fully empty bladder: No Stream: Strong Urgency: Yes:   Frequency: every 3 hours  Leakage: Urge to void and Walking to the bathroom Pads: Yes: 1   PROLAPSE: None   Objective: Muscle strength: Abdominal:  3-/5    TODAY'S TREATMENT:                                                                                                                              DATE:   EVAL 12/13/22   PATIENT EDUCATION:  Education details: HEP Person educated: Patient Education method: Explanation Education comprehension: verbalized understanding  HOME EXERCISE PROGRAM: Access Code: [email or web address] URL: [email or web address] Date: 12/13/2022 Prepared by: Rayetta Humphrey Given all in one packet only doing 1-3 Exercises - Standing Transverse Abdominis Contraction  - 1 x daily - 7 x weekly - 1  sets - 10-5 reps - Seated Transversus Abdominis Bracing  - 1 x daily - 7 x weekly - 1 sets - 10-15 reps - Supine Heel Slides  - 1 x daily - 7 x weekly - 1 sets - 10-15 reps - Sidelying Pelvic Floor Contraction with Hip Abduction  - 1 x daily - 7 x weekly - 1 sets - 10-15 reps - 3-5" hold - Prone Hip Extension  - 1 x daily - 7 x weekly - 1 sets - 10-15 reps - 3-5" hold - Quadruped Alternating Leg Extensions  -  1 x daily - 7 x weekly - 1 sets - 10-15 reps - 3-5" hold - Quadruped Transversus Abdominis Bracing  - 1 x daily - 7 x weekly - 1 sets - 10-15 reps - 3-5" hold - Bird Dog  - 1 x daily - 7 x weekly - 1 sets - 10-15 reps - 3-5" hold - Supine Abdominal Crunch - Hands Behind Head (AKA)  - 1 x daily - 7 x weekly - 1 sets - 10-30 reps - 3-5" hold  ASSESSMENT:  CLINICAL IMPRESSION: Patient is a 89 y.o. female who was seen today for physical therapy evaluation and treatment for urge incontinence. Evaluation demonstrates postural dysfunction as well as weak lower abdominal and pelvic floor mm.  Ms. Peyton will benefit from skilled PT to educate pt in strengthening exercises to assist in decreasing her incontinence.   OBJECTIVE IMPAIRMENTS: decreased strength.   ACTIVITY LIMITATIONS: continence  PARTICIPATION LIMITATIONS: community activity   PERSONAL FACTORS: Age are also affecting patient's functional outcome.   REHAB POTENTIAL: Fair    CLINICAL DECISION MAKING: Stable/uncomplicated  EVALUATION COMPLEXITY: Low   GOALS: Goals reviewed with patient? No  SHORT TERM GOALS: Target date: 2 week  PT to note that she has improved 25% in the ability to make it to the bathroom without having an accident Baseline: Goal status: INITIAL  LONG TERM GOALS: Target date: 4 weeks   PT to note that she has improved 25% in the ability to make it to the bathroom without having an accident Baseline:  Goal status: INITIAL  PLAN:  PT FREQUENCY: 1x every other week   PT DURATION: 8 weeks  PLANNED INTERVENTIONS: Therapeutic exercises, Patient/Family education, and Self Care  PLAN FOR NEXT SESSION: begin crunch, sidelying abduction and prone hip extension.    Rayetta Humphrey, [place] CLT [phone]  12/13/2022, 11:09AM

## 2022-12-14 ENCOUNTER — Other Ambulatory Visit: Payer: Self-pay | Admitting: Family Medicine

## 2023-01-01 DIAGNOSIS — D485 Neoplasm of uncertain behavior of skin: Secondary | ICD-10-CM | POA: Diagnosis not present

## 2023-01-01 DIAGNOSIS — D225 Melanocytic nevi of trunk: Secondary | ICD-10-CM | POA: Diagnosis not present

## 2023-01-01 DIAGNOSIS — Z85828 Personal history of other malignant neoplasm of skin: Secondary | ICD-10-CM | POA: Diagnosis not present

## 2023-01-01 DIAGNOSIS — L820 Inflamed seborrheic keratosis: Secondary | ICD-10-CM | POA: Diagnosis not present

## 2023-01-01 DIAGNOSIS — Z08 Encounter for follow-up examination after completed treatment for malignant neoplasm: Secondary | ICD-10-CM | POA: Diagnosis not present

## 2023-01-01 DIAGNOSIS — L814 Other melanin hyperpigmentation: Secondary | ICD-10-CM | POA: Diagnosis not present

## 2023-01-01 DIAGNOSIS — L821 Other seborrheic keratosis: Secondary | ICD-10-CM | POA: Diagnosis not present

## 2023-01-01 DIAGNOSIS — L538 Other specified erythematous conditions: Secondary | ICD-10-CM | POA: Diagnosis not present

## 2023-01-07 ENCOUNTER — Telehealth: Payer: Self-pay | Admitting: Pulmonary Disease

## 2023-01-07 ENCOUNTER — Encounter (HOSPITAL_COMMUNITY): Payer: Medicare Other | Admitting: Physical Therapy

## 2023-01-07 DIAGNOSIS — J849 Interstitial pulmonary disease, unspecified: Secondary | ICD-10-CM

## 2023-01-07 NOTE — Telephone Encounter (Signed)
Pt. Calling to get new prescription on ESBRIET 267 MG TABS sent to [facility]. They help pay for the meds/grant money

## 2023-01-08 MED ORDER — ESBRIET 267 MG PO TABS: 801.0000 mg | ORAL_TABLET | Freq: Three times a day (TID) | ORAL | 2 refills | Status: DC

## 2023-01-08 NOTE — Addendum Note (Signed)
Addended by: Cassandria Anger on: 01/08/2023 03:38 PM   Modules accepted: Orders

## 2023-01-08 NOTE — Telephone Encounter (Signed)
Patient is calling to get Esbriet refilled please advise

## 2023-01-08 NOTE — Telephone Encounter (Signed)
Refill sent for ESBRIET to [facility] ([facility]) for Esbriet: [phone]  Dose: 801 mg three times daily  Last OV: 09/20/2022 Provider: Dr. [name] Browner  Next OV: 6 months  Labs on 09/20/2022 wnl  Knox Saliva, PharmD, MPH, BCPS Clinical Pharmacist (Rheumatology and Pulmonology)

## 2023-01-29 DIAGNOSIS — M0579 Rheumatoid arthritis with rheumatoid factor of multiple sites without organ or systems involvement: Secondary | ICD-10-CM | POA: Diagnosis not present

## 2023-02-07 DIAGNOSIS — L728 Other follicular cysts of the skin and subcutaneous tissue: Secondary | ICD-10-CM | POA: Diagnosis not present

## 2023-02-16 NOTE — Progress Notes (Unsigned)
[address], [place] [address]   CLINIC:  Medical Oncology/Hematology  PCP:  [name], DO [place] [address] [phone]   REASON FOR VISIT:  Follow-up for thrombocytopenia  PRIOR THERAPY: None  CURRENT THERAPY: Surveillance  INTERVAL HISTORY:   [name] 89 y.o. female returns for routine follow-up of thrombocytopenia.  She was last evaluated via telemedicine visit by Tarri Abernethy, PA-C on 02/25/2022.  At today's visit, she reports feeling fairly well.  No recent hospitalizations, surgeries, or changes in baseline health status. She admits to easy bruising but denies petechial rash.   She reports that she bleeds easily when she cuts her self, but denies any abnormal bleeding such as epistaxis, hematemesis, hematochezia, melena, or hematuria. She continues to take Eliquis for her atrial fibrillation. She denies any B symptoms such as fever, chills, night sweats, unintentional weight loss. She denies any new masses or lymphadenopathy. She remains active and golfs several times per week. She has 50% energy and 40% appetite. She endorses that she is maintaining a stable weight.  ASSESSMENT & PLAN:  1.   Mild thrombocytopenia - Per review of labs, she had mild thrombocytopenia for the last since July 2021, and a self-limited episode of mild thrombocytopenia in March 2018 during hospitalization - She does not have any anemia, hemoglobin derangements, or WBC abnormalities apparent on CBC. - Most recent abdominal imaging via abdominal ultrasound (09/24/2016): Spleen size and appearance within normal limits - Hematology work-up (04/11/2021):  Peripheral smear confirms thrombocytopenia H pylori and hepatitis negative No nutritional deficiencies (unremarkable folate, B12, methylmalonic acid, copper) - Most recent labs (02/17/2023): Platelets normal at 161, normal LDH.  Normal B12, folate, methylmalonic acid when last  checked in March 2023 - She admits to easy bruising but denies any bleeding or petechial rash. - Differential diagnosis includes immune-mediated thrombocytopenia (positive history of rheumatoid arthritis), drug-related thrombocytopenia (azathioprine), and early MDS - No treatment indicated at this time.  Treatment for thrombocytopenia will be considered if platelets were less than 30, or in the event of significant bleeding. - PLAN: RTC in 1 year with labs.   2. Other history - PMH:  Chronic atrial fibrillation (Eliquis), pulmonary fibrosis (pirfenidone), rheumatoid arthritis (azathioprine),  and other comorbidities noted elsewhere in medical record -[name] lives at home with her husband and is functionally independent.  She enjoys an active retirement and plays golf several times per week.  She is retired from work as Agricultural engineer. - Lifelong nonsmoker; no alcohol or illicit drugs.  No chemical or pesticide exposure. - She denies any family history of blood disorders. Positive for heart disease and stroke.  The patient's daughter is deceased from breast cancer at age 30.  Patient's maternal aunt also had breast cancer.  Her brother had colon cancer.  PLAN SUMMARY: >> Labs in 1 year =CBC/D, CMP, LDH >> OFFICE visit in 1 year     REVIEW OF SYSTEMS:   Review of Systems  Constitutional:  Positive for appetite change (chronically low appetite, 40%) and fatigue (chronic, energy 50%). Negative for chills, diaphoresis, fever and unexpected weight change.  HENT:   Negative for lump/mass and nosebleeds.   Eyes:  Negative for eye problems.  Respiratory:  Positive for shortness of breath (chronic due to pulmonary fibrosis). Negative for cough and hemoptysis.   Cardiovascular:  Positive for palpitations (chronic atrial fibrillation). Negative for chest pain and leg swelling.  Gastrointestinal:  Positive for nausea. Negative for abdominal  pain, blood in stool, constipation, diarrhea and vomiting.   Genitourinary:  Positive for bladder incontinence. Negative for hematuria.   Musculoskeletal:  Positive for arthralgias (Rheumatoid arthritis).  Skin: Negative.   Neurological:  Positive for dizziness, headaches and numbness (Right leg). Negative for light-headedness.  Hematological:  Does not bruise/bleed easily.  Psychiatric/Behavioral:  Positive for sleep disturbance.      PHYSICAL EXAM:  ECOG PERFORMANCE STATUS: 1 - Symptomatic but completely ambulatory  There were no vitals filed for this visit. There were no vitals filed for this visit. Physical Exam Constitutional:      Appearance: Normal appearance. She is normal weight.  Cardiovascular:     Rate and Rhythm: Rhythm irregular.     Heart sounds: Normal heart sounds.  Pulmonary:     Breath sounds: Normal breath sounds.  Skin:    Findings: Bruising present.  Neurological:     General: No focal deficit present.     Mental Status: Mental status is at baseline.  Psychiatric:        Behavior: Behavior normal. Behavior is cooperative.    PAST MEDICAL/SURGICAL HISTORY:  Past Medical History:  Diagnosis Date   Calcification of bronchial airway 06/23/2018   01/2017 - See on CT imaging of the chest    Cataract    Cerebral vascular disease 06/19/2018   06/19/2018 - MRI with chronic microvascular ischemic change   Diverticulosis    Hypertension    Osteoporosis    PAF (paroxysmal atrial fibrillation) ([facility])    a. s/p prior DCCV; b. On flecainide & coumadin (CHA2DS2VASc = 4);  c. 03/2016 Echo: EF 55-60%, mod LVH, mod AI, mild to mod TR, PASP 39mmHg.   Pelvic fracture ([facility])    Pulmonary fibrosis ([facility]) 2018   Rheumatoid arthritis ([facility]) 2017   Past Surgical History:  Procedure Laterality Date   BIOPSY BREAST     BIOPSY BREAST     right & benign   BREAST SURGERY     CARDIOVERSION N/A 10/30/2016   Procedure: CARDIOVERSION;  Surgeon: Larey Dresser, MD;  Location: [facility];  Service: Cardiovascular;  Laterality: N/A;    CARDIOVERSION N/A 08/20/2017   Procedure: CARDIOVERSION;  Surgeon: Jerline Pain, MD;  Location: [facility];  Service: Cardiovascular;  Laterality: N/A;   CARDIOVERSION N/A 02/02/2020   Procedure: CARDIOVERSION;  Surgeon: Geralynn Rile, MD;  Location: [facility];  Service: Cardiovascular;  Laterality: N/A;   CATARACT EXTRACTION     COLONOSCOPY     EYE SURGERY     cataracts   INGUINAL HERNIA REPAIR Left 07/18/2022   Procedure: LAPAROSCOPIC LEFT INGUINAL HERNIA REPAIR WITH MESH POSSIBLE OPEN;  Surgeon: Ralene Ok, MD;  Location: [facility];  Service: General;  Laterality: Left;   TEE WITHOUT CARDIOVERSION N/A 10/30/2016   Procedure: TRANSESOPHAGEAL ECHOCARDIOGRAM (TEE);  Surgeon: Larey Dresser, MD;  Location: [facility];  Service: Cardiovascular;  Laterality: N/A;   TOTAL HIP ARTHROPLASTY Right 01/27/2017   Procedure: TOTAL HIP ARTHROPLASTY ANTERIOR APPROACH;  Surgeon: Rod Can, MD;  Location: [facility];  Service: Orthopedics;  Laterality: Right;    SOCIAL HISTORY:  Social History   Socioeconomic History   Marital status: Married    Spouse name: Bill    Number of children: 2   Years of education: 12   Highest education level: Not on file  Occupational History   Occupation: RETIRED  Tobacco Use   Smoking status: Never   Smokeless tobacco: Never  Vaping Use   Vaping Use: Never used  Substance and Sexual Activity   Alcohol use: No   Drug use: No   Sexual activity: Yes    Birth control/protection: Post-menopausal  Other Topics Concern   Not on file  Social History Narrative   Lives with her husband.   Enjoys playing golf.   Still very active and independent   Social Determinants of Health   Financial Resource Strain: Low Risk  (11/05/2022)   Overall Financial Resource Strain (CARDIA)    Difficulty of Paying Living Expenses: Not hard at all  Food Insecurity: No Food Insecurity (11/05/2022)   Hunger Vital Sign    Worried About Running Out of Food in the Last  Year: Never true    Ran Out of Food in the Last Year: Never true  Transportation Needs: No Transportation Needs (11/05/2022)   PRAPARE - Hydrologist (Medical): No    Lack of Transportation (Non-Medical): No  Physical Activity: Sufficiently Active (11/05/2022)   Exercise Vital Sign    Days of Exercise per Week: 5 days    Minutes of Exercise per Session: 30 min  Stress: No Stress Concern Present (11/05/2022)   [place]    Feeling of Stress : Not at all  Social Connections: [place] (11/05/2022)   Social Connection and Isolation Panel [NHANES]    Frequency of Communication with Friends and Family: More than three times a week    Frequency of Social Gatherings with Friends and Family: More than three times a week    Attends Religious Services: More than 4 times per year    Active Member of [employer] or Organizations: Yes    Attends Music therapist: More than 4 times per year    Marital Status: Married  Human resources officer Violence: Not At Risk (11/05/2022)   Humiliation, Afraid, Rape, and Kick questionnaire    Fear of Current or Ex-Partner: No    Emotionally Abused: No    Physically Abused: No    Sexually Abused: No    FAMILY HISTORY:  Family History  Problem Relation Age of Onset   Stroke Mother        cerebral hemorrhage   Heart disease Father        MI   Heart attack Father    Vision loss Father    Heart disease Brother    Heart attack Brother    Cancer Maternal Aunt        breast   Cancer Brother    Heart disease Brother    Cancer Daughter        breast   Heart disease Son    Colon cancer Neg Hx    Colon polyps Neg Hx    Rectal cancer Neg Hx    Stomach cancer Neg Hx     CURRENT MEDICATIONS:  Outpatient Encounter Medications as of 02/17/2023  Medication Sig   apixaban (ELIQUIS) 2.5 MG TABS tablet Take 1 tablet (2.5 mg total) by mouth 2 (two) times  daily.   azaTHIOprine (IMURAN) 50 MG tablet Take 50 mg by mouth daily.    Calcium Carbonate-Vitamin D 600-10 MG-MCG TABS Take 1 tablet by mouth 2 (two) times daily.   diltiazem (CARDIZEM CD) 300 MG 24 hr capsule TAKE 1 CAPSULE BY MOUTH DAILY   ESBRIET 267 MG TABS Take 3 tablets (801 mg total) by mouth with breakfast, with lunch, and with evening meal.   furosemide (LASIX) 20 MG tablet TAKE 1 TO 2  TABLETS BY MOUTH  DAILY AS NEEDED   gabapentin (NEURONTIN) 100 MG capsule Take 100 mg by mouth at bedtime.    ketoconazole (NIZORAL) 2 % shampoo Apply 1 Application topically daily as needed for irritation.   levothyroxine (SYNTHROID) 75 MCG tablet Take 1 tablet (75 mcg total) by mouth daily before breakfast.   lisinopril (ZESTRIL) 20 MG tablet TAKE 1 TABLET BY MOUTH DAILY   lisinopril-hydrochlorothiazide (ZESTORETIC) 20-12.5 MG tablet TAKE 1 TABLET BY MOUTH DAILY IN  THE MORNING   Melatonin 5 MG TABS Take 5 mg by mouth at bedtime.   Multiple Vitamin (MULTIVITAMIN) capsule Take 1 capsule by mouth daily.   potassium chloride SA (KLOR-CON M) 20 MEQ tablet TAKE 1 TABLET BY MOUTH DAILY   Probiotic Product (PROBIOTIC-10 ULTIMATE PO) Take 1 tablet by mouth daily.   No facility-administered encounter medications on file as of 02/17/2023.    ALLERGIES:  Allergies  Allergen Reactions   Benazepril Hcl Cough    LABORATORY DATA:  I have reviewed the labs as listed.  CBC    Component Value Date/Time   WBC 5.0 10/14/2022 1145   WBC 4.9 07/18/2022 0852   RBC 3.92 10/14/2022 1145   RBC 3.88 07/18/2022 0852   HGB 13.2 10/14/2022 1145   HCT 39.0 10/14/2022 1145   PLT 141 (L) 10/14/2022 1145   MCV 100 (H) 10/14/2022 1145   MCH 33.7 (H) 10/14/2022 1145   MCH 33.8 07/18/2022 0852   MCHC 33.8 10/14/2022 1145   MCHC 33.7 07/18/2022 0852   RDW 12.4 10/14/2022 1145   LYMPHSABS 1.2 04/22/2022 1520   MONOABS 0.5 06/07/2020 1117   EOSABS 0.1 04/22/2022 1520   BASOSABS 0.0 04/22/2022 1520      Latest Ref  Rng & Units 09/20/2022   11:56 AM 07/18/2022    8:52 AM 04/22/2022    3:20 PM  CMP  Glucose 70 - 99 mg/dL 84  86  103   BUN 6 - 23 mg/dL 25  25  25    Creatinine 0.40 - 1.20 mg/dL 1.12  1.18  1.11   Sodium 135 - 145 mEq/L 139  142  145   Potassium 3.5 - 5.1 mEq/L 4.3  3.4  3.8   Chloride 96 - 112 mEq/L 100  104  99   CO2 19 - 32 mEq/L 34  29  31   Calcium 8.4 - 10.5 mg/dL 9.9  9.6  9.5   Total Protein 6.0 - 8.3 g/dL 7.0   6.5   Total Bilirubin 0.2 - 1.2 mg/dL 0.3   0.3   Alkaline Phos 39 - 117 U/L 62   78   AST 0 - 37 U/L 23   19   ALT 0 - 35 U/L 10   13     DIAGNOSTIC IMAGING:  I have independently reviewed the relevant imaging and discussed with the patient.   WRAP UP:  All questions were answered. The patient knows to call the clinic with any problems, questions or concerns.  Medical decision making: Low  Time spent on visit: I spent 15 minutes counseling the patient face to face. The total time spent in the appointment was 22 minutes and more than 50% was on counseling.  Harriett Rush, PA-C  02/17/2023 11:16 AM

## 2023-02-17 ENCOUNTER — Inpatient Hospital Stay: Payer: Medicare Other | Admitting: Physician Assistant

## 2023-02-17 ENCOUNTER — Other Ambulatory Visit: Payer: Medicare Other

## 2023-02-17 ENCOUNTER — Ambulatory Visit: Payer: Medicare Other | Admitting: Physician Assistant

## 2023-02-17 ENCOUNTER — Other Ambulatory Visit: Payer: Self-pay

## 2023-02-17 ENCOUNTER — Inpatient Hospital Stay: Payer: Medicare Other | Attending: Physician Assistant

## 2023-02-17 VITALS — BP 146/75 | HR 95 | Temp 97.2°F | Resp 19 | Ht 60.0 in | Wt 121.0 lb

## 2023-02-17 DIAGNOSIS — R110 Nausea: Secondary | ICD-10-CM | POA: Diagnosis not present

## 2023-02-17 DIAGNOSIS — I482 Chronic atrial fibrillation, unspecified: Secondary | ICD-10-CM | POA: Insufficient documentation

## 2023-02-17 DIAGNOSIS — M069 Rheumatoid arthritis, unspecified: Secondary | ICD-10-CM | POA: Diagnosis not present

## 2023-02-17 DIAGNOSIS — Z8673 Personal history of transient ischemic attack (TIA), and cerebral infarction without residual deficits: Secondary | ICD-10-CM | POA: Diagnosis not present

## 2023-02-17 DIAGNOSIS — J8410 Pulmonary fibrosis, unspecified: Secondary | ICD-10-CM | POA: Diagnosis not present

## 2023-02-17 DIAGNOSIS — Z79899 Other long term (current) drug therapy: Secondary | ICD-10-CM | POA: Diagnosis not present

## 2023-02-17 DIAGNOSIS — Z7901 Long term (current) use of anticoagulants: Secondary | ICD-10-CM | POA: Diagnosis not present

## 2023-02-17 DIAGNOSIS — Z800 Family history of malignant neoplasm of digestive organs: Secondary | ICD-10-CM | POA: Insufficient documentation

## 2023-02-17 DIAGNOSIS — G479 Sleep disorder, unspecified: Secondary | ICD-10-CM | POA: Insufficient documentation

## 2023-02-17 DIAGNOSIS — D696 Thrombocytopenia, unspecified: Secondary | ICD-10-CM

## 2023-02-17 DIAGNOSIS — R32 Unspecified urinary incontinence: Secondary | ICD-10-CM | POA: Insufficient documentation

## 2023-02-17 DIAGNOSIS — Z823 Family history of stroke: Secondary | ICD-10-CM | POA: Diagnosis not present

## 2023-02-17 DIAGNOSIS — R0602 Shortness of breath: Secondary | ICD-10-CM | POA: Insufficient documentation

## 2023-02-17 DIAGNOSIS — R42 Dizziness and giddiness: Secondary | ICD-10-CM | POA: Diagnosis not present

## 2023-02-17 DIAGNOSIS — Z8249 Family history of ischemic heart disease and other diseases of the circulatory system: Secondary | ICD-10-CM | POA: Insufficient documentation

## 2023-02-17 DIAGNOSIS — R002 Palpitations: Secondary | ICD-10-CM | POA: Diagnosis not present

## 2023-02-17 DIAGNOSIS — Z821 Family history of blindness and visual loss: Secondary | ICD-10-CM | POA: Diagnosis not present

## 2023-02-17 DIAGNOSIS — Z803 Family history of malignant neoplasm of breast: Secondary | ICD-10-CM | POA: Diagnosis not present

## 2023-02-17 LAB — CBC WITH DIFFERENTIAL/PLATELET
Abs Immature Granulocytes: 0.01 10*3/uL (ref 0.00–0.07)
Basophils Absolute: 0 10*3/uL (ref 0.0–0.1)
Basophils Relative: 1 %
Eosinophils Absolute: 0.1 10*3/uL (ref 0.0–0.5)
Eosinophils Relative: 3 %
HCT: 39.5 % (ref 36.0–46.0)
Hemoglobin: 12.9 g/dL (ref 12.0–15.0)
Immature Granulocytes: 0 %
Lymphocytes Relative: 23 %
Lymphs Abs: 1 10*3/uL (ref 0.7–4.0)
MCH: 33 pg (ref 26.0–34.0)
MCHC: 32.7 g/dL (ref 30.0–36.0)
MCV: 101 fL — ABNORMAL HIGH (ref 80.0–100.0)
Monocytes Absolute: 0.5 10*3/uL (ref 0.1–1.0)
Monocytes Relative: 11 %
Neutro Abs: 2.7 10*3/uL (ref 1.7–7.7)
Neutrophils Relative %: 62 %
Platelets: 161 10*3/uL (ref 150–400)
RBC: 3.91 MIL/uL (ref 3.87–5.11)
RDW: 13.4 % (ref 11.5–15.5)
WBC: 4.4 10*3/uL (ref 4.0–10.5)
nRBC: 0 % (ref 0.0–0.2)

## 2023-02-17 LAB — LACTATE DEHYDROGENASE: LDH: 146 U/L (ref 98–192)

## 2023-03-06 ENCOUNTER — Other Ambulatory Visit: Payer: Self-pay | Admitting: Pharmacist

## 2023-03-06 DIAGNOSIS — J849 Interstitial pulmonary disease, unspecified: Secondary | ICD-10-CM

## 2023-03-06 MED ORDER — ESBRIET 267 MG PO TABS
801.0000 mg | ORAL_TABLET | Freq: Three times a day (TID) | ORAL | 0 refills | Status: DC
Start: 2023-03-06 — End: 2023-03-31

## 2023-03-06 NOTE — Telephone Encounter (Signed)
Refill for Esbriet sent to [facility] for only month. Patient has OV next monht. Further r/f will be sent after OV and labs updated  Chesley Mires, PharmD, MPH, BCPS, CPP Clinical Pharmacist (Rheumatology and Pulmonology)

## 2023-03-24 ENCOUNTER — Telehealth: Payer: Self-pay | Admitting: Family Medicine

## 2023-03-24 DIAGNOSIS — I482 Chronic atrial fibrillation, unspecified: Secondary | ICD-10-CM

## 2023-03-24 MED ORDER — APIXABAN 2.5 MG PO TABS
2.5000 mg | ORAL_TABLET | Freq: Two times a day (BID) | ORAL | 3 refills | Status: DC
Start: 2023-03-24 — End: 2023-10-29

## 2023-03-24 NOTE — Telephone Encounter (Signed)
Left samples upfront   Pt wants to know If she needs a rx sent in ?

## 2023-03-28 ENCOUNTER — Ambulatory Visit (INDEPENDENT_AMBULATORY_CARE_PROVIDER_SITE_OTHER): Payer: Medicare Other | Admitting: Pulmonary Disease

## 2023-03-28 ENCOUNTER — Encounter: Payer: Self-pay | Admitting: Pulmonary Disease

## 2023-03-28 ENCOUNTER — Telehealth: Payer: Self-pay | Admitting: Pulmonary Disease

## 2023-03-28 ENCOUNTER — Ambulatory Visit: Payer: Medicare Other | Admitting: Pulmonary Disease

## 2023-03-28 VITALS — BP 124/70 | HR 84 | Ht 60.0 in | Wt 121.2 lb

## 2023-03-28 DIAGNOSIS — Z5181 Encounter for therapeutic drug level monitoring: Secondary | ICD-10-CM

## 2023-03-28 DIAGNOSIS — R911 Solitary pulmonary nodule: Secondary | ICD-10-CM

## 2023-03-28 DIAGNOSIS — I272 Pulmonary hypertension, unspecified: Secondary | ICD-10-CM | POA: Diagnosis not present

## 2023-03-28 DIAGNOSIS — J849 Interstitial pulmonary disease, unspecified: Secondary | ICD-10-CM

## 2023-03-28 LAB — PULMONARY FUNCTION TEST
DL/VA % pred: 69 %
DL/VA: 2.88 ml/min/mmHg/L
DLCO cor % pred: 61 %
DLCO cor: 9.83 ml/min/mmHg
DLCO unc % pred: 61 %
DLCO unc: 9.83 ml/min/mmHg
FEF 25-75 Post: 0.95 L/sec
FEF 25-75 Pre: 1.11 L/sec
FEF2575-%Change-Post: -13 %
FEF2575-%Pred-Post: 124 %
FEF2575-%Pred-Pre: 144 %
FEV1-%Change-Post: -1 %
FEV1-%Pred-Post: 116 %
FEV1-%Pred-Pre: 117 %
FEV1-Post: 1.5 L
FEV1-Pre: 1.51 L
FEV1FVC-%Change-Post: 7 %
FEV1FVC-%Pred-Pre: 100 %
FEV6-%Change-Post: -4 %
FEV6-%Pred-Post: 117 %
FEV6-%Pred-Pre: 123 %
FEV6-Post: 1.92 L
FEV6-Pre: 2.02 L
FEV6FVC-%Change-Post: 1 %
FEV6FVC-%Pred-Post: 108 %
FEV6FVC-%Pred-Pre: 106 %
FVC-%Change-Post: -7 %
FVC-%Pred-Post: 108 %
FVC-%Pred-Pre: 117 %
FVC-Post: 1.92 L
FVC-Pre: 2.08 L
Post FEV1/FVC ratio: 78 %
Post FEV6/FVC ratio: 100 %
Pre FEV1/FVC ratio: 73 %
Pre FEV6/FVC Ratio: 99 %
RV % pred: 88 %
RV: 2.09 L
TLC % pred: 92 %
TLC: 4.12 L

## 2023-03-28 LAB — COMPREHENSIVE METABOLIC PANEL
ALT: 10 U/L (ref 0–35)
AST: 21 U/L (ref 0–37)
Albumin: 3.9 g/dL (ref 3.5–5.2)
Alkaline Phosphatase: 69 U/L (ref 39–117)
BUN: 30 mg/dL — ABNORMAL HIGH (ref 6–23)
CO2: 32 mEq/L (ref 19–32)
Calcium: 10 mg/dL (ref 8.4–10.5)
Chloride: 99 mEq/L (ref 96–112)
Creatinine, Ser: 1.22 mg/dL — ABNORMAL HIGH (ref 0.40–1.20)
GFR: 39.49 mL/min — ABNORMAL LOW (ref >60.00)
Glucose, Bld: 96 mg/dL (ref 70–99)
Potassium: 4 mEq/L (ref 3.5–5.1)
Sodium: 140 mEq/L (ref 135–145)
Total Bilirubin: 0.3 mg/dL (ref 0.2–1.2)
Total Protein: 7.1 g/dL (ref 6.0–8.3)

## 2023-03-28 NOTE — Patient Instructions (Signed)
Full PFT performed today. °

## 2023-03-28 NOTE — Patient Instructions (Signed)
Your PFTs show some progression of disease which is expected Continue the Esbriet Will check a complete metabolic panel today Will order high-res CT in 6 months Return to clinic in 6 months

## 2023-03-28 NOTE — Telephone Encounter (Signed)
Pt got seen and needs more refills on Esbriet sent to pharmacy

## 2023-03-28 NOTE — Progress Notes (Signed)
Full PFT performed today. °

## 2023-03-28 NOTE — Progress Notes (Signed)
Susan Fitzgerald    [id number]    May 02, 1932  Primary Care Physician:Braidyn Scorsone, Colbert Coyer, MD  Referring Physician: Raliegh Ip, DO [address] [place],  [place] [address]  Problem list: RA ILD On imuran Ofev poorly tolerated due to side effects On Esbriet since December 2020  HPI: 89 y.o.  with past medical history of atrial fibrillation, rheumatoid arthritis, pulmonary fibrosis.  Referred here for a second opinion on pulmonary fibrosis from Dr. Tonia Brooms  She has been diagnosed with rheumatoid arthritis around 2017.  She follows with Dr. Dierdre Forth.  She was initially on methotrexate which was held briefly in 2019 due to elevated creatinine, but restarted around November 2019 for worsening symptoms.  She did not tolerate leflunomide due to significant hair loss.  Methotrexate was stopped and she was started started on azathioprine after discussion with Dr. 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  This was poorly tolerated due to diarrhea, constipation and lower GI bleed Antifibrotic's change to Esbriet in December 2020 which is tolerated much better than Ofev Esbriet was changed to generic pirfenidone in summer 2022.  She does not like the full effect as the pills are bigger and went back to Esbriet in September 2022  Finished pulmonary rehab in 2020 with improvement in symptoms of dyspnea.  She is back to golfing  Diagnosed with COVID-07 October 2019.  Did not require hospitalization. Follows with Dr. Antoine Poche for management of atrial fibrillation  Extended ILD questionnaire 02/12/2019-no known exposures, no mold, hot tub, Jacuzzi, humidifier, no birds at home. Smoking history: Never smoker Travel history: No significant travel history Relevant family history: No family history of lung disease or pulmonary fibrosis.  Interim history: Has hernia repair in 2023 without any complication.  She has  resumed golfing 3 weeks postsurgery Here for review of PFTs.  Breathing is stable with no issues.   Outpatient Encounter Medications as of 03/28/2023  Medication Sig   apixaban (ELIQUIS) 2.5 MG TABS tablet Take 1 tablet (2.5 mg total) by mouth 2 (two) times daily.   azaTHIOprine (IMURAN) 50 MG tablet Take 50 mg by mouth daily.    Calcium Carbonate-Vitamin D 600-10 MG-MCG TABS Take 1 tablet by mouth 2 (two) times daily.   diltiazem (CARDIZEM CD) 300 MG 24 hr capsule TAKE 1 CAPSULE BY MOUTH DAILY   ESBRIET 267 MG TABS Take 3 tablets (801 mg total) by mouth with breakfast, with lunch, and with evening meal.   furosemide (LASIX) 20 MG tablet TAKE 1 TO 2 TABLETS BY MOUTH  DAILY AS NEEDED   gabapentin (NEURONTIN) 100 MG capsule Take 100 mg by mouth at bedtime.    ketoconazole (NIZORAL) 2 % shampoo Apply 1 Application topically daily as needed for irritation.   levothyroxine (SYNTHROID) 75 MCG tablet Take 1 tablet (75 mcg total) by mouth daily before breakfast.   lisinopril (ZESTRIL) 20 MG tablet TAKE 1 TABLET BY MOUTH DAILY   lisinopril-hydrochlorothiazide (ZESTORETIC) 20-12.5 MG tablet TAKE 1 TABLET BY MOUTH DAILY IN  THE MORNING   Melatonin 5 MG TABS Take 5 mg by mouth at bedtime.   Multiple Vitamin (MULTIVITAMIN) capsule Take 1 capsule by mouth daily.   potassium chloride SA (KLOR-CON M) 20 MEQ tablet TAKE 1 TABLET BY MOUTH DAILY   Probiotic Product (PROBIOTIC-10 ULTIMATE PO) Take 1 tablet by mouth daily.   No facility-administered  encounter medications on file as of 03/28/2023.   Physical Exam: Blood pressure 124/70, pulse 84, height 5' (1.524 m), weight 121 lb 3.2 oz (55 kg), SpO2 96 %. Gen:      No acute distress HEENT:  EOMI, sclera anicteric Neck:     No masses; no thyromegaly Lungs:    Clear to auscultation bilaterally; normal respiratory effort CV:         Regular rate and rhythm; no murmurs Abd:      + bowel sounds; soft, non-tender; no palpable masses, no distension Ext:    No  edema; adequate peripheral perfusion Skin:      Warm and dry; no rash Neuro: alert and oriented x 3 Psych: normal mood and affect   Data Reviewed: Imaging: CT chest 02/09/2017- mild reticulation, interstitial prominence at the bases. CT chest 06/23/2018- groundglass attenuation, interlobular septal thickening, subpleural reticulation mild air trapping. CT chest 01/13/2019- peripheral and basilar pattern of subpleural reticulation, groundglass and traction bronchiectasis.  No honeycombing.  Probable UIP pattern. CT high-resolution 08/31/2019-stable pulmonary fibrosis probable UIP pattern CT high-resolution 09/06/2020-stable probable UIP pattern pulmonary fibrosis CT high-resolution 09/10/2021-stable pulmonary fibrosis, new 5 mm lung nodule CT high-resolution 09/17/2022-mild progression of probable UIP pulmonary fibrosis.  Left apex lung nodule has resolved. I reviewed the images personally.  PFTs: 01/20/2019 FVC 2.17 [114%), FEV1 1.67 [120%],/F 77, TLC 78%, DLCO 56% Minimal restriction with moderate diffusion impairment.  06/05/2020 FVC 2.17 [119%], FEV1 1.68 [127%], F/F 77, TLC 4.36 [99%], DLCO 10.30 [65%] Mild diffusion impairment.  Improvement compared to 2020  03/28/2023 FVC 1.92 [190%], FEV1 1.50 [116%], F/F78, TLC 4.12 [92%], DLCO 9.83 [61%] Moderate diffusion impairment with progression compared to 2021  Labs: CCP 02/15/2016-24 Rheumatoid factor 02/14/2018-23.2  Cardiac: Echocardiogram 04/01/2022-mild reduction in RV systolic function, estimated RVSP 42.2, mildly elevated PA systolic pressure.  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  Other considerations include amiodarone toxicity but she has been off amiodarone for some time now.  Methotrexate can cause pulmonary fibrosis in this pattern as well. Would not recommend work-up for IPF such as lung biopsy as it would not change our  recommended management.   Did not tolerate Ofev but is doing well with esbriet.  She prefers to take 3 tablets 3 times a day as she does not like the bigger single dose tablet Finished pulmonary rehab and remains active with golf PFTs reviewed with mild progression which is expected for the disease Continue monitoring Check CMP and follow-up CT in 6 months  Echo done by Dr. Antoine Poche reviewed with mild pulmonary hypertension.  Since she is doing well clinically we will continue to monitor this closely.  Bedside she does not want invasive procedures such as a right heart catheterization  Lung nodule Follow-up on repeat CT scan  Health maintenance Up-to-date with vaccination  Plan/Recommendations: - Continue brand-name Esbriet.  - Check labs for monitoring - CT scan in 6 months   Chilton Greathouse MD [facility] 03/28/2023, 11:15 AM  CC: Raliegh Ip, DO

## 2023-03-31 MED ORDER — ESBRIET 267 MG PO TABS
801.0000 mg | ORAL_TABLET | Freq: Three times a day (TID) | ORAL | 1 refills | Status: DC
Start: 2023-03-31 — End: 2023-08-22

## 2023-03-31 NOTE — Telephone Encounter (Signed)
LFTs on 03/28/2023 wnl. Rx for Esbriet sent to [facility]. Called patient to advise. She will f/u with pharmacy to schedule shipment  Chesley Mires, PharmD, MPH, BCPS, CPP Clinical Pharmacist (Rheumatology and Pulmonology)

## 2023-04-02 DIAGNOSIS — D485 Neoplasm of uncertain behavior of skin: Secondary | ICD-10-CM | POA: Diagnosis not present

## 2023-04-02 DIAGNOSIS — L538 Other specified erythematous conditions: Secondary | ICD-10-CM | POA: Diagnosis not present

## 2023-04-02 DIAGNOSIS — C44729 Squamous cell carcinoma of skin of left lower limb, including hip: Secondary | ICD-10-CM | POA: Diagnosis not present

## 2023-04-02 DIAGNOSIS — C44722 Squamous cell carcinoma of skin of right lower limb, including hip: Secondary | ICD-10-CM | POA: Diagnosis not present

## 2023-04-02 DIAGNOSIS — L821 Other seborrheic keratosis: Secondary | ICD-10-CM | POA: Diagnosis not present

## 2023-04-02 DIAGNOSIS — L820 Inflamed seborrheic keratosis: Secondary | ICD-10-CM | POA: Diagnosis not present

## 2023-04-07 NOTE — Telephone Encounter (Signed)
Erroneous encounter will close.

## 2023-04-17 ENCOUNTER — Other Ambulatory Visit: Payer: Self-pay | Admitting: Cardiology

## 2023-04-18 ENCOUNTER — Encounter: Payer: Self-pay | Admitting: Family Medicine

## 2023-04-18 ENCOUNTER — Ambulatory Visit (INDEPENDENT_AMBULATORY_CARE_PROVIDER_SITE_OTHER): Payer: Medicare Other | Admitting: Family Medicine

## 2023-04-18 VITALS — BP 125/78 | HR 60 | Temp 98.7°F | Ht 60.0 in | Wt 119.0 lb

## 2023-04-18 DIAGNOSIS — E039 Hypothyroidism, unspecified: Secondary | ICD-10-CM

## 2023-04-18 DIAGNOSIS — I4819 Other persistent atrial fibrillation: Secondary | ICD-10-CM

## 2023-04-18 DIAGNOSIS — I129 Hypertensive chronic kidney disease with stage 1 through stage 4 chronic kidney disease, or unspecified chronic kidney disease: Secondary | ICD-10-CM | POA: Diagnosis not present

## 2023-04-18 DIAGNOSIS — N1831 Chronic kidney disease, stage 3a: Secondary | ICD-10-CM | POA: Diagnosis not present

## 2023-04-18 DIAGNOSIS — I700 Atherosclerosis of aorta: Secondary | ICD-10-CM | POA: Diagnosis not present

## 2023-04-18 DIAGNOSIS — M48062 Spinal stenosis, lumbar region with neurogenic claudication: Secondary | ICD-10-CM | POA: Diagnosis not present

## 2023-04-18 DIAGNOSIS — Z23 Encounter for immunization: Secondary | ICD-10-CM

## 2023-04-18 NOTE — Progress Notes (Signed)
Subjective: CC: 105-month checkup PCP: Raliegh Ip, DO Susan Fitzgerald is a 89 y.o. female presenting to clinic today for:  1.  Atrial fibrillation Patient is compliant with her Cardizem, Eliquis.  She does not report any vaginal or rectal bleeding.  She continues to have lesions palpable in her lower extremities that are being evaluated and seem to be excised via Mohs procedure by dermatology.  Continues to have easy bruising along the upper extremity and lower extremities.  2.  Hypertension Compliant with medications.  Her blood pressures at home are running 109-147/52-86.  No chest pain, shortness of breath, dizziness or falls reported.  3.  Back pain Patient has history of degenerative changes in the lumbar spine.  She was previously under the care of Dr. Franky Macho and Dr. Carolyn Stare, who used to administer injections into her back.  They were especially helpful but she wonders since it has been 7 years if perhaps medicine has involved and there are new options for her.  She does not want to pursue any surgical interventions but would be certainly willing to proceed with repeat injection therapy.  Often she is able to cope with the low back pain that she experiences by utilizing Tylenol when she golfs or sitting down for few minutes and then trying to repeat activities.  She notes that the pain seems to be most present when she is standing straight up, particularly at the sting or scope.  Bending seems to relieve it.  She does not report any sensory changes in the lower extremities.  She continues to be very physically active, golfing 2-3 times per week.  Has urinary incontinence and has seen specialist for this but again does not want to undergo any surgical interventions.  They advised Kegel exercise   ROS: Per HPI  Allergies  Allergen Reactions   Benazepril Hcl Cough   Past Medical History:  Diagnosis Date   Calcification of bronchial airway 06/23/2018   01/2017 - See on CT  imaging of the chest    Cataract    Cerebral vascular disease 06/19/2018   06/19/2018 - MRI with chronic microvascular ischemic change   Diverticulosis    Hypertension    Osteoporosis    PAF (paroxysmal atrial fibrillation) (HCC)    a. s/p prior DCCV; b. On flecainide & coumadin (CHA2DS2VASc = 4);  c. 03/2016 Echo: EF 55-60%, mod LVH, mod AI, mild to mod TR, PASP .   Pelvic fracture (HCC)    Pulmonary fibrosis (HCC) 2018   Rheumatoid arthritis (HCC) 2017    Current Outpatient Medications:    apixaban (ELIQUIS) 2.5 MG TABS tablet, Take 1 tablet (2.5 mg total) by mouth 2 (two) times daily., Disp: 180 tablet, Rfl: 3   azaTHIOprine (IMURAN) 50 MG tablet, Take 50 mg by mouth daily. , Disp: , Rfl:    Calcium Carbonate-Vitamin D 600-10 MG-MCG TABS, Take 1 tablet by mouth 2 (two) times daily., Disp: , Rfl:    diltiazem (CARDIZEM CD) 300 MG 24 hr capsule, TAKE 1 CAPSULE BY MOUTH DAILY, Disp: 100 capsule, Rfl: 2   ESBRIET 267 MG TABS, Take 3 tablets (801 mg total) by mouth with breakfast, with lunch, and with evening meal., Disp: 810 tablet, Rfl: 1   furosemide (LASIX) 20 MG tablet, TAKE 1 TO 2 TABLETS BY MOUTH  DAILY AS NEEDED, Disp: 200 tablet, Rfl: 1   gabapentin (NEURONTIN) 100 MG capsule, Take 100 mg by mouth at bedtime. , Disp: , Rfl:    ketoconazole (  NIZORAL) 2 % shampoo, Apply 1 Application topically daily as needed for irritation., Disp: , Rfl:    levothyroxine (SYNTHROID) 75 MCG tablet, Take 1 tablet (75 mcg total) by mouth daily before breakfast., Disp: 90 tablet, Rfl: 3   lisinopril (ZESTRIL) 20 MG tablet, TAKE 1 TABLET BY MOUTH DAILY, Disp: 100 tablet, Rfl: 2   lisinopril-hydrochlorothiazide (ZESTORETIC) 20-12.5 MG tablet, TAKE 1 TABLET BY MOUTH DAILY IN  THE MORNING, Disp: 100 tablet, Rfl: 2   Melatonin 5 MG TABS, Take 5 mg by mouth at bedtime., Disp: , Rfl:    Multiple Vitamin (MULTIVITAMIN) capsule, Take 1 capsule by mouth daily., Disp: , Rfl:    potassium chloride SA (KLOR-CON  M) 20 MEQ tablet, TAKE 1 TABLET BY MOUTH DAILY, Disp: 100 tablet, Rfl: 1   Probiotic Product (PROBIOTIC-10 ULTIMATE PO), Take 1 tablet by mouth daily., Disp: , Rfl:  Social History   Socioeconomic History   Marital status: Married    Spouse name: Bill    Number of children: 2   Years of education: 12   Highest education level: 12th grade  Occupational History   Occupation: RETIRED  Tobacco Use   Smoking status: Never   Smokeless tobacco: Never  Vaping Use   Vaping Use: Never used  Substance and Sexual Activity   Alcohol use: No   Drug use: No   Sexual activity: Yes    Birth control/protection: Post-menopausal  Other Topics Concern   Not on file  Social History Narrative   Lives with her husband.   Enjoys playing golf.   Still very active and independent   Social Determinants of Health   Financial Resource Strain: Low Risk  (04/17/2023)   Overall Financial Resource Strain (CARDIA)    Difficulty of Paying Living Expenses: Not hard at all  Food Insecurity: No Food Insecurity (04/17/2023)   Hunger Vital Sign    Worried About Running Out of Food in the Last Year: Never true    Ran Out of Food in the Last Year: Never true  Transportation Needs: No Transportation Needs (04/17/2023)   PRAPARE - Administrator, Civil Service (Medical): No    Lack of Transportation (Non-Medical): No  Physical Activity: Sufficiently Active (04/17/2023)   Exercise Vital Sign    Days of Exercise per Week: 3 days    Minutes of Exercise per Session: 150+ min  Stress: No Stress Concern Present (04/17/2023)   [employer] of Occupational Health - Occupational Stress Questionnaire    Feeling of Stress : Not at all  Social Connections: Socially Integrated (04/17/2023)   Social Connection and Isolation Panel [NHANES]    Frequency of Communication with Friends and Family: More than three times a week    Frequency of Social Gatherings with Friends and Family: More than three times a week     Attends Religious Services: More than 4 times per year    Active Member of [employer] or Organizations: Yes    Attends Engineer, structural: More than 4 times per year    Marital Status: Married  Catering manager Violence: Not At Risk (11/05/2022)   Humiliation, Afraid, Rape, and Kick questionnaire    Fear of Current or Ex-Partner: No    Emotionally Abused: No    Physically Abused: No    Sexually Abused: No   Family History  Problem Relation Age of Onset   Stroke Mother        cerebral hemorrhage   Heart disease Father  MI   Heart attack Father    Vision loss Father    Heart disease Brother    Heart attack Brother    Cancer Maternal Aunt        breast   Cancer Brother    Heart disease Brother    Cancer Daughter        breast   Heart disease Son    Colon cancer Neg Hx    Colon polyps Neg Hx    Rectal cancer Neg Hx    Stomach cancer Neg Hx     Objective: Office vital signs reviewed. BP 125/78 Comment: home BP  Pulse 60   Temp 98.7 F (37.1 C)   Ht 5' (1.524 m)   Wt 119 lb (54 kg)   SpO2 96%   BMI 23.24 kg/m   Physical Examination:  General: Awake, alert, well nourished, No acute distress HEENT:sclera white, MMM.  TMs intact bilaterally with normal light reflex.  No exophthalmos Cardio: Irregularly irregular with rate controlled.  S1S2 heard, no murmurs appreciated Pulm: clear to auscultation bilaterally, no wheezes, rhonchi or rales; normal work of breathing on room air Extremities: Warm.  She has several postsurgical skin lesions noted along the anterior shins bilaterally with postinflammatory/venous stasis hyperpigmentation and hemosiderin deposit noted bilaterally. MSK: Ambulating independently.  Gait is minimally antalgic and station is hunched  Assessment/ Plan: 89 y.o. female   Stage 3a chronic kidney disease (HCC) - Plan: VITAMIN D 25 Hydroxy (Vit-D Deficiency, Fractures)  Atrial fibrillation, persistent (HCC)  Acquired hypothyroidism -  Plan: TSH, T4, Free  Aortic atherosclerosis (HCC) - Plan: Lipid Panel  Spinal stenosis of lumbar region with neurogenic claudication - Plan: Ambulatory referral to Orthopedic Surgery  Check vitamin D level.  CMP and CBC just checked in April so I did not repeat these today.  Renal function was stable  I am going to complete a form for patient assistance through our health department to see if perhaps we can get some reduced cost medication for her.  She was given 3 boxes of samples today to get her through.  No red flag signs or symptoms with anticoagulation  Fasting lipid panel, thyroid levels collected today.  Referral to orthopedics placed, Dr Ethelene Hal, per her request for second opinion.  Desires injection therapy  No orders of the defined types were placed in this encounter.  No orders of the defined types were placed in this encounter.    Raliegh Ip, DO [facility] [phone]

## 2023-04-19 LAB — T4, FREE: Free T4: 1.41 ng/dL (ref 0.82–1.77)

## 2023-04-19 LAB — VITAMIN D 25 HYDROXY (VIT D DEFICIENCY, FRACTURES): Vit D, 25-Hydroxy: 59.3 ng/mL (ref 30.0–100.0)

## 2023-04-19 LAB — LIPID PANEL
Chol/HDL Ratio: 1.9 ratio (ref 0.0–4.4)
Cholesterol, Total: 187 mg/dL (ref 100–199)
HDL: 98 mg/dL (ref >39)
LDL Chol Calc (NIH): 81 mg/dL (ref 0–99)
Triglycerides: 41 mg/dL (ref 0–149)
VLDL Cholesterol Cal: 8 mg/dL (ref 5–40)

## 2023-04-19 LAB — TSH: TSH: 1.22 u[IU]/mL (ref 0.450–4.500)

## 2023-04-23 DIAGNOSIS — N1831 Chronic kidney disease, stage 3a: Secondary | ICD-10-CM | POA: Diagnosis not present

## 2023-04-23 DIAGNOSIS — M5136 Other intervertebral disc degeneration, lumbar region: Secondary | ICD-10-CM | POA: Diagnosis not present

## 2023-04-23 DIAGNOSIS — Z6822 Body mass index (BMI) 22.0-22.9, adult: Secondary | ICD-10-CM | POA: Diagnosis not present

## 2023-04-23 DIAGNOSIS — Z79899 Other long term (current) drug therapy: Secondary | ICD-10-CM | POA: Diagnosis not present

## 2023-04-23 DIAGNOSIS — M5030 Other cervical disc degeneration, unspecified cervical region: Secondary | ICD-10-CM | POA: Diagnosis not present

## 2023-04-23 DIAGNOSIS — M1991 Primary osteoarthritis, unspecified site: Secondary | ICD-10-CM | POA: Diagnosis not present

## 2023-04-23 DIAGNOSIS — J849 Interstitial pulmonary disease, unspecified: Secondary | ICD-10-CM | POA: Diagnosis not present

## 2023-04-23 DIAGNOSIS — M25512 Pain in left shoulder: Secondary | ICD-10-CM | POA: Diagnosis not present

## 2023-04-23 DIAGNOSIS — R7989 Other specified abnormal findings of blood chemistry: Secondary | ICD-10-CM | POA: Diagnosis not present

## 2023-04-23 DIAGNOSIS — M65331 Trigger finger, right middle finger: Secondary | ICD-10-CM | POA: Diagnosis not present

## 2023-04-23 DIAGNOSIS — M0579 Rheumatoid arthritis with rheumatoid factor of multiple sites without organ or systems involvement: Secondary | ICD-10-CM | POA: Diagnosis not present

## 2023-04-29 DIAGNOSIS — C44722 Squamous cell carcinoma of skin of right lower limb, including hip: Secondary | ICD-10-CM | POA: Diagnosis not present

## 2023-04-29 DIAGNOSIS — D485 Neoplasm of uncertain behavior of skin: Secondary | ICD-10-CM | POA: Diagnosis not present

## 2023-04-29 DIAGNOSIS — C44729 Squamous cell carcinoma of skin of left lower limb, including hip: Secondary | ICD-10-CM | POA: Diagnosis not present

## 2023-05-16 DIAGNOSIS — D485 Neoplasm of uncertain behavior of skin: Secondary | ICD-10-CM | POA: Diagnosis not present

## 2023-05-16 DIAGNOSIS — C44722 Squamous cell carcinoma of skin of right lower limb, including hip: Secondary | ICD-10-CM | POA: Diagnosis not present

## 2023-05-16 DIAGNOSIS — L570 Actinic keratosis: Secondary | ICD-10-CM | POA: Diagnosis not present

## 2023-05-16 DIAGNOSIS — I872 Venous insufficiency (chronic) (peripheral): Secondary | ICD-10-CM | POA: Diagnosis not present

## 2023-05-16 DIAGNOSIS — C44622 Squamous cell carcinoma of skin of right upper limb, including shoulder: Secondary | ICD-10-CM | POA: Diagnosis not present

## 2023-05-16 DIAGNOSIS — L821 Other seborrheic keratosis: Secondary | ICD-10-CM | POA: Diagnosis not present

## 2023-05-21 DIAGNOSIS — M47816 Spondylosis without myelopathy or radiculopathy, lumbar region: Secondary | ICD-10-CM | POA: Insufficient documentation

## 2023-05-24 ENCOUNTER — Other Ambulatory Visit: Payer: Self-pay | Admitting: Family Medicine

## 2023-05-27 ENCOUNTER — Encounter: Payer: Self-pay | Admitting: Family Medicine

## 2023-05-27 ENCOUNTER — Ambulatory Visit (INDEPENDENT_AMBULATORY_CARE_PROVIDER_SITE_OTHER): Payer: Medicare Other | Admitting: Family Medicine

## 2023-05-27 VITALS — BP 130/80 | HR 95 | Temp 98.5°F | Ht 60.0 in | Wt 120.0 lb

## 2023-05-27 DIAGNOSIS — N3000 Acute cystitis without hematuria: Secondary | ICD-10-CM | POA: Diagnosis not present

## 2023-05-27 DIAGNOSIS — I4819 Other persistent atrial fibrillation: Secondary | ICD-10-CM

## 2023-05-27 DIAGNOSIS — R300 Dysuria: Secondary | ICD-10-CM | POA: Diagnosis not present

## 2023-05-27 LAB — URINALYSIS, ROUTINE W REFLEX MICROSCOPIC
Bilirubin, UA: NEGATIVE
Glucose, UA: NEGATIVE
Ketones, UA: NEGATIVE
Nitrite, UA: POSITIVE — AB
Specific Gravity, UA: 1.02 (ref 1.005–1.030)
Urobilinogen, Ur: 1 mg/dL (ref 0.2–1.0)
pH, UA: 7 (ref 5.0–7.5)

## 2023-05-27 LAB — MICROSCOPIC EXAMINATION: Renal Epithel, UA: NONE SEEN /hpf

## 2023-05-27 MED ORDER — DILTIAZEM HCL ER COATED BEADS 300 MG PO CP24
300.0000 mg | ORAL_CAPSULE | Freq: Every day | ORAL | 0 refills | Status: DC
Start: 2023-05-27 — End: 2023-07-03

## 2023-05-27 MED ORDER — FLUCONAZOLE 150 MG PO TABS
150.00 mg | ORAL_TABLET | Freq: Once | ORAL | 0 refills | Status: AC
Start: 2023-05-27 — End: 2023-05-27

## 2023-05-27 MED ORDER — CEPHALEXIN 500 MG PO CAPS
500.00 mg | ORAL_CAPSULE | Freq: Three times a day (TID) | ORAL | 0 refills | Status: AC
Start: 2023-05-27 — End: 2023-06-03

## 2023-05-27 NOTE — Progress Notes (Signed)
Subjective: CC:?  UTI PCP: Raliegh Ip, DO [id number] Susan Fitzgerald is a 89 y.o. female presenting to clinic today for:  1.  UTI Patient reports onset of increased frequency, urgency midweek last week.  On Friday symptoms seem to be a bit more prevalent so she started taking AZO.  This helped and she did not seek evaluation.  Over the weekend however symptoms became much more prevalent and AZO was no longer helpful so she presents today for further evaluation.  Denies fevers, new flank pain or hematuria.  No nausea or vomiting reported.  She does have chronic low back pain and will be seeing orthopedics to discuss further options  2.  Atrial fibrillation Patient compliant with Eliquis, Cardizem.  She would like to see if perhaps she is eligible for Eliquis through patient assistance program.  No reports of bleeding   ROS: Per HPI  Allergies  Allergen Reactions   Benazepril Hcl Cough   Past Medical History:  Diagnosis Date   Calcification of bronchial airway 06/23/2018   01/2017 - See on CT imaging of the chest    Cataract    Cerebral vascular disease 06/19/2018   06/19/2018 - MRI with chronic microvascular ischemic change   Diverticulosis    Hypertension    Osteoporosis    PAF (paroxysmal atrial fibrillation) (HCC)    a. s/p prior DCCV; b. On flecainide & coumadin (CHA2DS2VASc = 4);  c. 03/2016 Echo: EF 55-60%, mod LVH, mod AI, mild to mod TR, PASP .   Pelvic fracture (HCC)    Pulmonary fibrosis (HCC) 2018   Rheumatoid arthritis (HCC) 2017    Current Outpatient Medications:    apixaban (ELIQUIS) 2.5 MG TABS tablet, Take 1 tablet (2.5 mg total) by mouth 2 (two) times daily., Disp: 180 tablet, Rfl: 3   azaTHIOprine (IMURAN) 50 MG tablet, Take 50 mg by mouth daily. , Disp: , Rfl:    Calcium Carbonate-Vitamin D 600-10 MG-MCG TABS, Take 1 tablet by mouth 2 (two) times daily., Disp: , Rfl:    diltiazem (CARDIZEM CD) 300 MG 24 hr capsule, TAKE 1 CAPSULE BY MOUTH DAILY, Disp:  100 capsule, Rfl: 2   ESBRIET 267 MG TABS, Take 3 tablets (801 mg total) by mouth with breakfast, with lunch, and with evening meal., Disp: 810 tablet, Rfl: 1   furosemide (LASIX) 20 MG tablet, TAKE 1 TO 2 TABLETS BY MOUTH  DAILY AS NEEDED, Disp: 200 tablet, Rfl: 1   gabapentin (NEURONTIN) 100 MG capsule, Take 100 mg by mouth at bedtime. , Disp: , Rfl:    ketoconazole (NIZORAL) 2 % shampoo, Apply 1 Application topically daily as needed for irritation., Disp: , Rfl:    levothyroxine (SYNTHROID) 75 MCG tablet, Take 1 tablet (75 mcg total) by mouth daily before breakfast., Disp: 90 tablet, Rfl: 3   lisinopril (ZESTRIL) 20 MG tablet, TAKE 1 TABLET BY MOUTH DAILY, Disp: 100 tablet, Rfl: 2   lisinopril-hydrochlorothiazide (ZESTORETIC) 20-12.5 MG tablet, TAKE 1 TABLET BY MOUTH DAILY IN  THE MORNING, Disp: 100 tablet, Rfl: 2   Melatonin 5 MG TABS, Take 5 mg by mouth at bedtime., Disp: , Rfl:    Multiple Vitamin (MULTIVITAMIN) capsule, Take 1 capsule by mouth daily., Disp: , Rfl:    potassium chloride SA (KLOR-CON M) 20 MEQ tablet, TAKE 1 TABLET BY MOUTH DAILY, Disp: 100 tablet, Rfl: 0   Probiotic Product (PROBIOTIC-10 ULTIMATE PO), Take 1 tablet by mouth daily., Disp: , Rfl:  Social History   Socioeconomic History  Marital status: Married    Spouse name: Annette Stable    Number of children: 2   Years of education: 12   Highest education level: 12th grade  Occupational History   Occupation: RETIRED  Tobacco Use   Smoking status: Never   Smokeless tobacco: Never  Vaping Use   Vaping Use: Never used  Substance and Sexual Activity   Alcohol use: No   Drug use: No   Sexual activity: Yes    Birth control/protection: Post-menopausal  Other Topics Concern   Not on file  Social History Narrative   Lives with her husband.   Enjoys playing golf.   Still very active and independent   Social Determinants of Health   Financial Resource Strain: Low Risk  (04/17/2023)   Overall Financial Resource Strain  (CARDIA)    Difficulty of Paying Living Expenses: Not hard at all  Food Insecurity: No Food Insecurity (04/17/2023)   Hunger Vital Sign    Worried About Running Out of Food in the Last Year: Never true    Ran Out of Food in the Last Year: Never true  Transportation Needs: No Transportation Needs (04/17/2023)   PRAPARE - Administrator, Civil Service (Medical): No    Lack of Transportation (Non-Medical): No  Physical Activity: Sufficiently Active (04/17/2023)   Exercise Vital Sign    Days of Exercise per Week: 3 days    Minutes of Exercise per Session: 150+ min  Stress: No Stress Concern Present (04/17/2023)   [employer] of Occupational Health - Occupational Stress Questionnaire    Feeling of Stress : Not at all  Social Connections: Socially Integrated (04/17/2023)   Social Connection and Isolation Panel [NHANES]    Frequency of Communication with Friends and Family: More than three times a week    Frequency of Social Gatherings with Friends and Family: More than three times a week    Attends Religious Services: More than 4 times per year    Active Member of [employer] or Organizations: Yes    Attends Engineer, structural: More than 4 times per year    Marital Status: Married  Catering manager Violence: Not At Risk (11/05/2022)   Humiliation, Afraid, Rape, and Kick questionnaire    Fear of Current or Ex-Partner: No    Emotionally Abused: No    Physically Abused: No    Sexually Abused: No   Family History  Problem Relation Age of Onset   Stroke Mother        cerebral hemorrhage   Heart disease Father        MI   Heart attack Father    Vision loss Father    Heart disease Brother    Heart attack Brother    Cancer Maternal Aunt        breast   Cancer Brother    Heart disease Brother    Cancer Daughter        breast   Heart disease Son    Colon cancer Neg Hx    Colon polyps Neg Hx    Rectal cancer Neg Hx    Stomach cancer Neg Hx     Objective: Office  vital signs reviewed. BP 130/80   Pulse 95   Temp 98.5 F (36.9 C)   Ht 5' (1.524 m)   Wt 120 lb (54.4 kg)   SpO2 95%   BMI 23.44 kg/m   Physical Examination:  General: Awake, alert, well nourished, No acute distress HEENT: No exophthalmos.  No goiter Cardio: Irregularly irregular with rate control, S1S2 heard, no murmurs appreciated Pulm: clear to auscultation bilaterally, no wheezes, rhonchi or rales; normal work of breathing on room air GU: Suprapubic tenderness present.  No CVA tenderness.  Assessment/ Plan: 89 y.o. female   Acute cystitis without hematuria - Plan: Urinalysis, Routine w reflex microscopic, Urine Culture, cephALEXin (KEFLEX) 500 MG capsule, fluconazole (DIFLUCAN) 150 MG tablet  Atrial fibrillation, persistent (HCC) - Plan: diltiazem (CARDIZEM CD) 300 MG 24 hr capsule, AMB Referral to Pharmacy Medication Management  Urinalysis does demonstrate UTI.  Keflex sent.  Diflucan sent for as needed use.  Sent for culture.  Discussed ways to reduce risk of recurrence  Diltiazem renewed.  Referral to CCM for Eliquis assistance.  Previously under patient assistance but thinks that she may need renewal of paperwork.   Orders Placed This Encounter  Procedures   Urine Culture   Urinalysis, Routine w reflex microscopic   No orders of the defined types were placed in this encounter.    Raliegh Ip, DO [facility] [phone]

## 2023-05-27 NOTE — Patient Instructions (Signed)
Urinary Tract Infection, Adult A urinary tract infection (UTI) is an infection of any part of the urinary tract. The urinary tract includes: The kidneys. The ureters. The bladder. The urethra. These organs make, store, and get rid of pee (urine) in the body. What are the causes? This infection is caused by germs (bacteria) in your genital area. These germs grow and cause swelling (inflammation) of your urinary tract. What increases the risk? The following factors may make you more likely to develop this condition: Using a small, thin tube (catheter) to drain pee. Not being able to control when you pee or poop (incontinence). Being female. If you are female, these things can increase the risk: Using these methods to prevent pregnancy: A medicine that kills sperm (spermicide). A device that blocks sperm (diaphragm). Having low levels of a female hormone (estrogen). Being pregnant. You are more likely to develop this condition if: You have genes that add to your risk. You are sexually active. You take antibiotic medicines. You have trouble peeing because of: A prostate that is bigger than normal, if you are female. A blockage in the part of your body that drains pee from the bladder. A kidney stone. A nerve condition that affects your bladder. Not getting enough to drink. Not peeing often enough. You have other conditions, such as: Diabetes. A weak disease-fighting system (immune system). Sickle cell disease. Gout. Injury of the spine. What are the signs or symptoms? Symptoms of this condition include: Needing to pee right away. Peeing small amounts often. Pain or burning when peeing. Blood in the pee. Pee that smells bad or not like normal. Trouble peeing. Pee that is cloudy. Fluid coming from the vagina, if you are female. Pain in the belly or lower back. Other symptoms include: Vomiting. Not feeling hungry. Feeling mixed up (confused). This may be the first symptom in  older adults. Being tired and grouchy (irritable). A fever. Watery poop (diarrhea). How is this treated? Taking antibiotic medicine. Taking other medicines. Drinking enough water. In some cases, you may need to see a specialist. Follow these instructions at home:  Medicines Take over-the-counter and prescription medicines only as told by your doctor. If you were prescribed an antibiotic medicine, take it as told by your doctor. Do not stop taking it even if you start to feel better. General instructions Make sure you: Pee until your bladder is empty. Do not hold pee for a long time. Empty your bladder after sex. Wipe from front to back after peeing or pooping if you are a female. Use each tissue one time when you wipe. Drink enough fluid to keep your pee pale yellow. Keep all follow-up visits. Contact a doctor if: You do not get better after 1-2 days. Your symptoms go away and then come back. Get help right away if: You have very bad back pain. You have very bad pain in your lower belly. You have a fever. You have chills. You feeling like you will vomit or you vomit. Summary A urinary tract infection (UTI) is an infection of any part of the urinary tract. This condition is caused by germs in your genital area. There are many risk factors for a UTI. Treatment includes antibiotic medicines. Drink enough fluid to keep your pee pale yellow. This information is not intended to replace advice given to you by your health care provider. Make sure you discuss any questions you have with your health care provider. Document Revised: 06/11/2020 Document Reviewed: 06/16/2020 Elsevier Patient Education    2024 Elsevier Inc.  

## 2023-05-28 ENCOUNTER — Other Ambulatory Visit: Payer: Self-pay | Admitting: Family Medicine

## 2023-05-29 ENCOUNTER — Other Ambulatory Visit: Payer: Self-pay | Admitting: Pharmacist

## 2023-05-29 ENCOUNTER — Telehealth: Payer: Self-pay

## 2023-05-29 ENCOUNTER — Encounter: Payer: Self-pay | Admitting: Pharmacist

## 2023-05-29 DIAGNOSIS — I482 Chronic atrial fibrillation, unspecified: Secondary | ICD-10-CM

## 2023-05-29 LAB — URINE CULTURE

## 2023-05-29 NOTE — Progress Notes (Signed)
error 

## 2023-05-29 NOTE — Progress Notes (Signed)
   Care Guide Note  05/29/2023 Name: [name] MRN: [id number] DOB: 11/08/1932  Referred by: Raliegh Ip, DO Reason for referral : Care Coordination (Outreach to schedule with Pharm d )   Susan Fitzgerald is a 89 y.o. year old female who is a primary care patient of Raliegh Ip, DO. Belanna S Carico was referred to the pharmacist for assistance related to Atrial Fibrillation and HTN.    Successful contact was made with the patient to discuss pharmacy services including being ready for the pharmacist to call at least 5 minutes before the scheduled appointment time, to have medication bottles and any blood sugar or blood pressure readings ready for review. The patient agreed to meet with the pharmacist via with the pharmacist via telephone visit on (date/time).  07/03/2023  Penne Lash, RMA Care Guide [facility]  [place], [place] [address] Direct Dial: [phone] [email or web address]

## 2023-05-29 NOTE — Progress Notes (Signed)
   05/29/2023 Name: Susan Fitzgerald MRN: [id number] DOB: 1932-09-18  Eliquis patient assistance  [id number] placed for PharmD assistance Will route to CPhT to complete paperwork and route back to me   Medication Access/Adherence  Patient reports affordability concerns with their medications: Yes  Eliquis Eliquis 2.5mg  by mouth twice daily Denies signs/symptoms of bleeding per PCP Medications/labs reviewed    Kieth Brightly, PharmD, BCACP Clinical Pharmacist, [facility]

## 2023-05-30 DIAGNOSIS — S81801A Unspecified open wound, right lower leg, initial encounter: Secondary | ICD-10-CM | POA: Diagnosis not present

## 2023-05-30 DIAGNOSIS — D485 Neoplasm of uncertain behavior of skin: Secondary | ICD-10-CM | POA: Diagnosis not present

## 2023-05-30 DIAGNOSIS — C44729 Squamous cell carcinoma of skin of left lower limb, including hip: Secondary | ICD-10-CM | POA: Diagnosis not present

## 2023-06-04 NOTE — Telephone Encounter (Signed)
Reached out to patient regarding her pending application and needing an updated oop spend for 2024.  Patient will get expense reports from pharmacy (mail order and [facility]), as well as the expense report for her husbands meds from the [place], and bring these to the office.

## 2023-06-17 DIAGNOSIS — C44729 Squamous cell carcinoma of skin of left lower limb, including hip: Secondary | ICD-10-CM | POA: Diagnosis not present

## 2023-06-23 ENCOUNTER — Telehealth: Payer: Self-pay

## 2023-06-23 NOTE — Telephone Encounter (Signed)
Received fax from [place] stating that pt has been approved to continue receiving Esbriet through PAP in 2025. Approval letter has been sent to scan center for retention.

## 2023-06-24 DIAGNOSIS — C44729 Squamous cell carcinoma of skin of left lower limb, including hip: Secondary | ICD-10-CM | POA: Diagnosis not present

## 2023-06-29 ENCOUNTER — Other Ambulatory Visit: Payer: Self-pay | Admitting: Cardiology

## 2023-07-01 DIAGNOSIS — L820 Inflamed seborrheic keratosis: Secondary | ICD-10-CM | POA: Diagnosis not present

## 2023-07-01 DIAGNOSIS — C44729 Squamous cell carcinoma of skin of left lower limb, including hip: Secondary | ICD-10-CM | POA: Diagnosis not present

## 2023-07-01 DIAGNOSIS — C44622 Squamous cell carcinoma of skin of right upper limb, including shoulder: Secondary | ICD-10-CM | POA: Diagnosis not present

## 2023-07-03 ENCOUNTER — Ambulatory Visit (INDEPENDENT_AMBULATORY_CARE_PROVIDER_SITE_OTHER): Payer: Medicare Other | Admitting: Pharmacist

## 2023-07-03 DIAGNOSIS — I482 Chronic atrial fibrillation, unspecified: Secondary | ICD-10-CM

## 2023-07-03 NOTE — Progress Notes (Signed)
    07/03/2023 Name: Susan Fitzgerald MRN: [id number] DOB: 07-May-1932   S: 69 YOF Presents for medication management and medication assistance for Eliquis.  Patient remains on Eliquis for Atrial fibrillation.  She is stable and tolerating medication.  She appears to be in the Medicare coverage gap and experiencing very high copays.  She has qualified for Eliquis Patient Assistance program in the past.  We will try to help her with re-enrollment. Insurance coverage/medication affordability:  [employer] medicare     Latest Ref Rng & Units 02/17/2023    8:59 AM 10/14/2022   11:45 AM 07/18/2022    8:52 AM  CBC  WBC 4.0 - 10.5 K/uL 4.4  5.0  4.9   Hemoglobin 12.0 - 15.0 g/dL 40.9  81.1  91.4   Hematocrit 36.0 - 46.0 % 39.5  39.0  38.9   Platelets 150 - 400 K/uL 161  141  153      A/P:   Patient has been compliant with Eliquis, but now cost has increased due to Medicare coverage gap patient has already submitted information to the [employer] patient assistance program to obtain Eliquis Instructed patient to submit out of pocket expenses for the household & she may qualify if she has reached 3% OOP Patient to follow up with [employer] patient assistance program regarding this process   PCP office is currently out of Eliquis samples    Information about your medication: Anticoagulant   Generic Name (Brand): Apixaban (Eliquis)   Apixaban is used to reduce the risk of forming blood clots that cause a stroke due to an irregular heartbeat.   Common SIDE EFFECTS you may experience include: extremity pain and increased risk of bleeding or bruising.   This drug must be taken consistently as prescribed to maintain its effect.   Multiple drug-drug interactions exist for this medication.  Consult healthcare professional prior to starting a new drug.   Tell your physicians and dentists that you are taking this drug before elective surgery or invasive procedures and before any new drug is prescribed.   Contact your health  care provider if you experience: any signs of blood loss or unusual bleeding.     Patient instructions provided.  Total time in counseling 20 minutes.        Kieth Brightly, PharmD, BCACP Clinical Pharmacist, [facility]

## 2023-07-14 ENCOUNTER — Encounter: Payer: Self-pay | Admitting: Family Medicine

## 2023-07-14 ENCOUNTER — Ambulatory Visit (INDEPENDENT_AMBULATORY_CARE_PROVIDER_SITE_OTHER): Payer: Medicare Other | Admitting: Family Medicine

## 2023-07-14 VITALS — BP 134/82 | HR 79 | Temp 98.6°F | Ht 60.0 in

## 2023-07-14 DIAGNOSIS — N3000 Acute cystitis without hematuria: Secondary | ICD-10-CM | POA: Diagnosis not present

## 2023-07-14 LAB — URINALYSIS, ROUTINE W REFLEX MICROSCOPIC
Bilirubin, UA: NEGATIVE
Glucose, UA: NEGATIVE
Nitrite, UA: NEGATIVE
Specific Gravity, UA: 1.02 (ref 1.005–1.030)
Urobilinogen, Ur: 0.2 mg/dL (ref 0.2–1.0)
pH, UA: 5.5 (ref 5.0–7.5)

## 2023-07-14 LAB — MICROSCOPIC EXAMINATION
Renal Epithel, UA: NONE SEEN /hpf
WBC, UA: >30 /hpf — AB (ref 0–5)
Yeast, UA: NONE SEEN

## 2023-07-14 MED ORDER — FLUCONAZOLE 150 MG PO TABS
150.0000 mg | ORAL_TABLET | Freq: Once | ORAL | 0 refills | Status: AC
Start: 2023-07-14 — End: 2023-07-14

## 2023-07-14 MED ORDER — CEPHALEXIN 500 MG PO CAPS
500.0000 mg | ORAL_CAPSULE | Freq: Two times a day (BID) | ORAL | 0 refills | Status: AC
Start: 2023-07-14 — End: 2023-07-21

## 2023-07-14 NOTE — Progress Notes (Signed)
Subjective: CC:UTI PCP: Raliegh Ip, DO GNF:AOZHY Susan Fitzgerald is a 89 y.o. female presenting to clinic today for:  1. UTI Patient reports that she has had a couple day history of feeling a little under the weather.  She reports low energy and recently started having increased frequency and dysuria.  She reports no flank pain, fevers, hematuria but she does report episodes of nausea, vomiting and loose stools.  She has not tested for COVID because she was not feeling that bad.  She admits that she does not hydrate super well but does try to sip throughout the day.   ROS: Per HPI  Allergies  Allergen Reactions   Benazepril Hcl Cough   Past Medical History:  Diagnosis Date   Calcification of bronchial airway 06/23/2018   01/2017 - See on CT imaging of the chest    Cataract    Cerebral vascular disease 06/19/2018   06/19/2018 - MRI with chronic microvascular ischemic change   Diverticulosis    Hypertension    Osteoporosis    PAF (paroxysmal atrial fibrillation) (HCC)    a. s/p prior DCCV; b. On flecainide & coumadin (CHA2DS2VASc = 4);  c. 03/2016 Echo: EF 55-60%, mod LVH, mod AI, mild to mod TR, PASP .   Pelvic fracture (HCC)    Pulmonary fibrosis (HCC) 2018   Rheumatoid arthritis (HCC) 2017    Current Outpatient Medications:    apixaban (ELIQUIS) 2.5 MG TABS tablet, Take 1 tablet (2.5 mg total) by mouth 2 (two) times daily., Disp: 180 tablet, Rfl: 3   azaTHIOprine (IMURAN) 50 MG tablet, Take 50 mg by mouth daily. , Disp: , Rfl:    Calcium Carbonate-Vitamin D 600-10 MG-MCG TABS, Take 1 tablet by mouth 2 (two) times daily., Disp: , Rfl:    cephALEXin (KEFLEX) 500 MG capsule, Take 1 capsule (500 mg total) by mouth 2 (two) times daily for 7 days., Disp: 14 capsule, Rfl: 0   diltiazem (CARDIZEM CD) 300 MG 24 hr capsule, TAKE 1 CAPSULE BY MOUTH DAILY, Disp: 100 capsule, Rfl: 0   ESBRIET 267 MG TABS, Take 3 tablets (801 mg total) by mouth with breakfast, with lunch, and with  evening meal., Disp: 810 tablet, Rfl: 1   fluconazole (DIFLUCAN) 150 MG tablet, Take 1 tablet (150 mg total) by mouth once for 1 dose., Disp: 1 tablet, Rfl: 0   furosemide (LASIX) 20 MG tablet, TAKE 1 TO 2 TABLETS BY MOUTH  DAILY AS NEEDED, Disp: 200 tablet, Rfl: 0   gabapentin (NEURONTIN) 100 MG capsule, Take 100 mg by mouth at bedtime. , Disp: , Rfl:    ketoconazole (NIZORAL) 2 % shampoo, Apply 1 Application topically daily as needed for irritation., Disp: , Rfl:    levothyroxine (SYNTHROID) 75 MCG tablet, Take 1 tablet (75 mcg total) by mouth daily before breakfast., Disp: 90 tablet, Rfl: 3   lisinopril (ZESTRIL) 20 MG tablet, TAKE 1 TABLET BY MOUTH DAILY, Disp: 100 tablet, Rfl: 2   lisinopril-hydrochlorothiazide (ZESTORETIC) 20-12.5 MG tablet, TAKE 1 TABLET BY MOUTH DAILY IN  THE MORNING, Disp: 100 tablet, Rfl: 2   Melatonin 5 MG TABS, Take 5 mg by mouth at bedtime., Disp: , Rfl:    Multiple Vitamin (MULTIVITAMIN) capsule, Take 1 capsule by mouth daily., Disp: , Rfl:    potassium chloride SA (KLOR-CON M) 20 MEQ tablet, TAKE 1 TABLET BY MOUTH DAILY, Disp: 100 tablet, Rfl: 0   Probiotic Product (PROBIOTIC-10 ULTIMATE PO), Take 1 tablet by mouth daily., Disp: ,  Rfl:  Social History   Socioeconomic History   Marital status: Married    Spouse name: Bill    Number of children: 2   Years of education: 12   Highest education level: 12th grade  Occupational History   Occupation: RETIRED  Tobacco Use   Smoking status: Never   Smokeless tobacco: Never  Vaping Use   Vaping status: Never Used  Substance and Sexual Activity   Alcohol use: No   Drug use: No   Sexual activity: Yes    Birth control/protection: Post-menopausal  Other Topics Concern   Not on file  Social History Narrative   Lives with her husband.   Enjoys playing golf.   Still very active and independent   Social Determinants of Health   Financial Resource Strain: Low Risk  (04/17/2023)   Overall Financial Resource Strain  (CARDIA)    Difficulty of Paying Living Expenses: Not hard at all  Food Insecurity: No Food Insecurity (04/17/2023)   Hunger Vital Sign    Worried About Running Out of Food in the Last Year: Never true    Ran Out of Food in the Last Year: Never true  Transportation Needs: No Transportation Needs (04/17/2023)   PRAPARE - Administrator, Civil Service (Medical): No    Lack of Transportation (Non-Medical): No  Physical Activity: Sufficiently Active (04/17/2023)   Exercise Vital Sign    Days of Exercise per Week: 3 days    Minutes of Exercise per Session: 150+ min  Stress: No Stress Concern Present (04/17/2023)   [employer] of Occupational Health - Occupational Stress Questionnaire    Feeling of Stress : Not at all  Social Connections: Socially Integrated (04/17/2023)   Social Connection and Isolation Panel [NHANES]    Frequency of Communication with Friends and Family: More than three times a week    Frequency of Social Gatherings with Friends and Family: More than three times a week    Attends Religious Services: More than 4 times per year    Active Member of [employer] or Organizations: Yes    Attends Engineer, structural: More than 4 times per year    Marital Status: Married  Catering manager Violence: Not At Risk (11/05/2022)   Humiliation, Afraid, Rape, and Kick questionnaire    Fear of Current or Ex-Partner: No    Emotionally Abused: No    Physically Abused: No    Sexually Abused: No   Family History  Problem Relation Age of Onset   Stroke Mother        cerebral hemorrhage   Heart disease Father        MI   Heart attack Father    Vision loss Father    Heart disease Brother    Heart attack Brother    Cancer Maternal Aunt        breast   Cancer Brother    Heart disease Brother    Cancer Daughter        breast   Heart disease Son    Colon cancer Neg Hx    Colon polyps Neg Hx    Rectal cancer Neg Hx    Stomach cancer Neg Hx     Objective: Office  vital signs reviewed. BP 134/82   Pulse 79   Temp 98.6 F (37 C)   Ht 5' (1.524 m)   SpO2 96%   BMI 23.44 kg/m   Physical Examination:  General: Awake, alert, well nourished, No acute distress GI:  Soft, nontender, nondistended. GU: No suprapubic tenderness palpation.  No CVA tenderness to palpation.  Results for orders placed or performed in visit on 07/14/23 (from the past 24 hour(s))  Urinalysis, Routine w reflex microscopic     Status: Abnormal   Collection Time: 07/14/23  2:19 PM  Result Value Ref Range   Specific Gravity, UA 1.020 1.005 - 1.030   pH, UA 5.5 5.0 - 7.5   Color, UA Yellow Yellow   Appearance Ur Cloudy (A) Clear   Leukocytes,UA 3+ (A) Negative   Protein,UA 2+ (A) Negative/Trace   Glucose, UA Negative Negative   Ketones, UA Trace (A) Negative   RBC, UA 2+ (A) Negative   Bilirubin, UA Negative Negative   Urobilinogen, Ur 0.2 0.2 - 1.0 mg/dL   Nitrite, UA Negative Negative   Microscopic Examination See below:    Narrative   Performed at:  [address] - [facility] [address] [place], [place]  [id number] Lab Director: Rockie Neighbours [facility], Phone:  [phone]  Microscopic Examination     Status: Abnormal   Collection Time: 07/14/23  2:19 PM   Urine  Result Value Ref Range   WBC, UA >30 (A) 0 - 5 /hpf   RBC, Urine 0-2 0 - 2 /hpf   Epithelial Cells (non renal) 0-10 0 - 10 /hpf   Renal Epithel, UA None seen None seen /hpf   Casts Present (A) None seen /lpf   Cast Type Hyaline casts N/A   Bacteria, UA Moderate (A) None seen/Few   Yeast, UA None seen None seen   Narrative   Performed at:  [address] - [facility] [address] [place], [place]  [id number] Lab Director: Rockie Neighbours [facility], Phone:  [phone]   *Note: Due to a large number of results and/or encounters for the requested time period, some results have not been displayed. A complete set of results can be found in Results Review.    Assessment/ Plan: 89 y.o. female   Acute cystitis  without hematuria - Plan: Urinalysis, Routine w reflex microscopic, Urine Culture, cephALEXin (KEFLEX) 500 MG capsule, fluconazole (DIFLUCAN) 150 MG tablet  Urinalysis consistent with UTI.  Keflex sent.  Diflucan sent for as needed use.  Encourage p.o. hydration.  Urine culture sent   Raliegh Ip, DO [facility] [phone]

## 2023-07-16 LAB — URINE CULTURE

## 2023-07-21 ENCOUNTER — Other Ambulatory Visit: Payer: Self-pay | Admitting: Family Medicine

## 2023-07-23 DIAGNOSIS — M0579 Rheumatoid arthritis with rheumatoid factor of multiple sites without organ or systems involvement: Secondary | ICD-10-CM | POA: Diagnosis not present

## 2023-07-28 ENCOUNTER — Telehealth: Payer: Self-pay | Admitting: Pulmonary Disease

## 2023-07-28 DIAGNOSIS — R911 Solitary pulmonary nodule: Secondary | ICD-10-CM

## 2023-07-28 NOTE — Telephone Encounter (Signed)
Patient is scheduled for her 6 mon f/u on 11/14. According to her last AVS, she also needs to complete a CT scan prior to this appointment. There is no order for a CT scan. Please place order, then route to [facility] so that they can schedule.

## 2023-07-30 NOTE — Telephone Encounter (Signed)
Ct ordered please schedule prior to 11/14 appt.

## 2023-08-02 ENCOUNTER — Other Ambulatory Visit: Payer: Self-pay | Admitting: Family Medicine

## 2023-08-06 DIAGNOSIS — L578 Other skin changes due to chronic exposure to nonionizing radiation: Secondary | ICD-10-CM | POA: Diagnosis not present

## 2023-08-06 DIAGNOSIS — L820 Inflamed seborrheic keratosis: Secondary | ICD-10-CM | POA: Diagnosis not present

## 2023-08-06 DIAGNOSIS — Z85828 Personal history of other malignant neoplasm of skin: Secondary | ICD-10-CM | POA: Diagnosis not present

## 2023-08-06 DIAGNOSIS — Z08 Encounter for follow-up examination after completed treatment for malignant neoplasm: Secondary | ICD-10-CM | POA: Diagnosis not present

## 2023-08-06 DIAGNOSIS — L570 Actinic keratosis: Secondary | ICD-10-CM | POA: Diagnosis not present

## 2023-08-06 DIAGNOSIS — L538 Other specified erythematous conditions: Secondary | ICD-10-CM | POA: Diagnosis not present

## 2023-08-06 DIAGNOSIS — L821 Other seborrheic keratosis: Secondary | ICD-10-CM | POA: Diagnosis not present

## 2023-08-20 ENCOUNTER — Telehealth: Payer: Self-pay | Admitting: Family Medicine

## 2023-08-20 ENCOUNTER — Other Ambulatory Visit: Payer: Self-pay | Admitting: Family Medicine

## 2023-08-20 MED ORDER — ONDANSETRON 4 MG PO TBDP: 4.0000 mg | ORAL_TABLET | Freq: Three times a day (TID) | ORAL | 0 refills | Status: DC | PRN

## 2023-08-20 NOTE — Telephone Encounter (Signed)
  Prescription Request  08/20/2023  Is this a "Controlled Substance" medicine? no  Have you seen your PCP in the last 2 weeks? no  If YES, route message to pool  -  If NO, patient needs to be scheduled for appointment.  What is the name of the medication or equipment? ondansetron 4mg  Pt is not having a problem with nausea she just likes to have this medication  Have you contacted your pharmacy to request a refill? yes   Which pharmacy would you like this sent to? [facility]    Patient notified that their request is being sent to the clinical staff for review and that they should receive a response within 2 business days.

## 2023-08-20 NOTE — Telephone Encounter (Signed)
sent 

## 2023-08-22 ENCOUNTER — Other Ambulatory Visit: Payer: Self-pay | Admitting: Pharmacist

## 2023-08-22 DIAGNOSIS — J849 Interstitial pulmonary disease, unspecified: Secondary | ICD-10-CM

## 2023-08-22 MED ORDER — ESBRIET 267 MG PO TABS
801.0000 mg | ORAL_TABLET | Freq: Three times a day (TID) | ORAL | 0 refills | Status: DC
Start: 2023-08-22 — End: 2023-11-06

## 2023-08-22 NOTE — Telephone Encounter (Signed)
Refill sent for ESBRIET to [facility] ([facility]) for Esbriet: [phone]  Dose: 801mg  three times daily  Last OV: 03/28/2023 Provider: Dr. Isaiah Serge Pertinent labs: CMET on 03/28/2023  Next OV: 10/01/2023  Routing to scheduling team for follow-up on appt scheduling  Chesley Mires, PharmD, MPH, BCPS Clinical Pharmacist (Rheumatology and Pulmonology)

## 2023-08-29 ENCOUNTER — Telehealth: Payer: Self-pay | Admitting: Family Medicine

## 2023-08-29 ENCOUNTER — Other Ambulatory Visit: Payer: Self-pay | Admitting: Family Medicine

## 2023-08-29 DIAGNOSIS — R3989 Other symptoms and signs involving the genitourinary system: Secondary | ICD-10-CM

## 2023-08-29 MED ORDER — CEPHALEXIN 500 MG PO CAPS
500.0000 mg | ORAL_CAPSULE | Freq: Two times a day (BID) | ORAL | 0 refills | Status: AC
Start: 2023-08-29 — End: 2023-09-05

## 2023-08-29 MED ORDER — FLUCONAZOLE 150 MG PO TABS
150.0000 mg | ORAL_TABLET | Freq: Once | ORAL | 0 refills | Status: AC
Start: 2023-08-29 — End: 2023-08-29

## 2023-08-29 NOTE — Telephone Encounter (Signed)
I will send meds this ONE time. In the future we can NOT accommodate antibiotics without a visit.  This is the office policy.  I will make an exception once because it sounds like she is not aware of this policy.  Have her follow up with me in person if symptoms are not resolving.

## 2023-08-29 NOTE — Telephone Encounter (Signed)
Patient is very grateful.  She understands clinic policy.

## 2023-09-01 ENCOUNTER — Ambulatory Visit: Payer: Medicare Other | Admitting: Family Medicine

## 2023-09-19 DIAGNOSIS — D485 Neoplasm of uncertain behavior of skin: Secondary | ICD-10-CM | POA: Diagnosis not present

## 2023-09-19 DIAGNOSIS — C44529 Squamous cell carcinoma of skin of other part of trunk: Secondary | ICD-10-CM | POA: Diagnosis not present

## 2023-09-22 ENCOUNTER — Ambulatory Visit
Admission: RE | Admit: 2023-09-22 | Discharge: 2023-09-22 | Disposition: A | Discharge status: Home / Self Care | Payer: Medicare Other | Source: Ambulatory Visit | Attending: Pulmonary Disease

## 2023-09-22 DIAGNOSIS — J8410 Pulmonary fibrosis, unspecified: Secondary | ICD-10-CM | POA: Diagnosis not present

## 2023-09-22 DIAGNOSIS — I700 Atherosclerosis of aorta: Secondary | ICD-10-CM | POA: Diagnosis not present

## 2023-09-22 DIAGNOSIS — R911 Solitary pulmonary nodule: Secondary | ICD-10-CM

## 2023-09-22 DIAGNOSIS — I2510 Atherosclerotic heart disease of native coronary artery without angina pectoris: Secondary | ICD-10-CM | POA: Diagnosis not present

## 2023-10-01 ENCOUNTER — Ambulatory Visit: Payer: Medicare Other | Admitting: Pulmonary Disease

## 2023-10-01 ENCOUNTER — Encounter: Payer: Self-pay | Admitting: Pulmonary Disease

## 2023-10-01 VITALS — BP 112/70 | HR 95 | Ht 60.0 in | Wt 118.0 lb

## 2023-10-01 DIAGNOSIS — R911 Solitary pulmonary nodule: Secondary | ICD-10-CM

## 2023-10-01 DIAGNOSIS — Z5181 Encounter for therapeutic drug level monitoring: Secondary | ICD-10-CM

## 2023-10-01 DIAGNOSIS — J849 Interstitial pulmonary disease, unspecified: Secondary | ICD-10-CM | POA: Diagnosis not present

## 2023-10-01 NOTE — Patient Instructions (Signed)
VISIT SUMMARY:  During today's visit, we discussed your ongoing health issues, including pulmonary fibrosis, rheumatoid arthritis, recurrent urinary tract infections, and back pain. We reviewed your current medications and made plans for future follow-ups and treatments.  YOUR PLAN:  -PULMONARY FIBROSIS: Pulmonary fibrosis is a condition where the lung tissue becomes scarred and stiff, making it difficult to breathe. Your condition appears stable based on your recent CT scan, and you are tolerating your current medication, Esbriet, well. We will continue with Esbriet and perform lung function tests at your next visit in six months.  -RHEUMATOID ARTHRITIS: Rheumatoid arthritis is an autoimmune disease that causes joint inflammation and pain. You are currently managing this condition with azathioprine under the care of Dr. Dierdre Forth. Your recent lab results were normal. Please continue taking azathioprine as prescribed and follow up with Dr. Dierdre Forth as scheduled.  -RECURRENT URINARY TRACT INFECTIONS: You have experienced several urinary tract infections recently. We will continue to monitor your symptoms and provide treatment as needed.  -BACK PAIN: You have been experiencing severe back pain, and a nerve burn procedure was planned but had to be postponed due to a recent infection. Please continue to follow up with your orthopedic group for the rescheduled procedure.  -GENERAL HEALTH MAINTENANCE: You received a flu shot last week and plan to get a COVID-19 booster shot soon. Continue to monitor for the need for RSV and pneumonia vaccines.  INSTRUCTIONS:  Please continue taking your medications as prescribed and follow up with your specialists as scheduled. We will perform lung function tests at your next visit in six months. If you experience any new or worsening symptoms, please contact our office.

## 2023-10-01 NOTE — Progress Notes (Signed)
Susan Fitzgerald    [id number]    1932/03/14  Primary Care Physician:Gottschalk, Rozell Searing, DO  Referring Physician: Raliegh Ip, DO [address] [place],  [place] [address]  Problem list: RA ILD On imuran Ofev poorly tolerated due to side effects On Esbriet since December 2020  HPI: 89 y.o.  with past medical history of atrial fibrillation, rheumatoid arthritis, pulmonary fibrosis.  Referred here for a second opinion on pulmonary fibrosis from Dr. Tonia Brooms  She has been diagnosed with rheumatoid arthritis around 2017.  She follows with Dr. Dierdre Forth.  She was initially on methotrexate which was held briefly in 2019 due to elevated creatinine, but restarted around November 2019 for worsening symptoms.  She did not tolerate leflunomide due to significant hair loss.  Methotrexate was stopped and she was started started on azathioprine after discussion with Dr. 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  This was poorly tolerated due to diarrhea, constipation and lower GI bleed Antifibrotic's change to Esbriet in December 2020 which is tolerated much better than Ofev Esbriet was changed to generic pirfenidone in summer 2022.  She does not like the full effect as the pills are bigger and went back to Esbriet in September 2022  Finished pulmonary rehab in 2020 with improvement in symptoms of dyspnea.  She is back to golfing  Diagnosed with COVID-07 October 2019.  Did not require hospitalization. Follows with Dr. Antoine Poche for management of atrial fibrillation  Extended ILD questionnaire 02/12/2019-no known exposures, no mold, hot tub, Jacuzzi, humidifier, no birds at home. Smoking history: Never smoker Travel history: No significant travel history Relevant family history: No family history of lung disease or pulmonary fibrosis.  Interim history: Discussed the use of AI scribe software for clinical  note transcription with the patient, who gave verbal consent to proceed.  Here for review of CT scan.  The patient, with a history of pulmonary fibrosis and rheumatoid arthritis, reports a gradual decrease in stamina and increased difficulty in breathing. Despite these symptoms, the patient is still able to engage in regular physical activity, specifically golf, a couple of times a week. The patient is currently on Esbriet for the pulmonary fibrosis and reports no ill effects from the medication. The patient was previously on Ofev but had to discontinue due to stomach issues.  In addition to the pulmonary fibrosis and rheumatoid arthritis, the patient has also had a few urinary infections in the past few months, a new development as the patient reports never having had this issue before. The patient also suffers from back pain, severe enough that a nerve ablation procedure was scheduled but had to be postponed due to an infection. The patient is due to see Dr. Antoine Poche, cardiologist for an irregular heart rate and is on a blood thinner for this condition.   Outpatient Encounter Medications as of 10/01/2023  Medication Sig   apixaban (ELIQUIS) 2.5 MG TABS tablet Take 1 tablet (2.5 mg total) by mouth 2 (two) times daily.   azaTHIOprine (IMURAN) 50 MG tablet Take 50 mg by mouth daily.    Calcium Carbonate-Vitamin D 600-10 MG-MCG TABS Take 1 tablet by mouth 2 (two) times daily.   diltiazem (CARDIZEM CD) 300 MG 24 hr capsule TAKE 1 CAPSULE BY MOUTH DAILY   ESBRIET 267 MG TABS Take 3 tablets (801 mg total) by mouth with  breakfast, with lunch, and with evening meal.   furosemide (LASIX) 20 MG tablet TAKE 1 TO 2 TABLETS BY MOUTH  DAILY AS NEEDED   gabapentin (NEURONTIN) 100 MG capsule Take 100 mg by mouth at bedtime.    ketoconazole (NIZORAL) 2 % shampoo Apply 1 Application topically daily as needed for irritation.   levothyroxine (SYNTHROID) 75 MCG tablet TAKE 1 TABLET BY MOUTH DAILY  BEFORE BREAKFAST    lisinopril (ZESTRIL) 20 MG tablet TAKE 1 TABLET BY MOUTH DAILY   lisinopril-hydrochlorothiazide (ZESTORETIC) 20-12.5 MG tablet TAKE 1 TABLET BY MOUTH DAILY IN  THE MORNING   Melatonin 5 MG TABS Take 5 mg by mouth at bedtime.   Multiple Vitamin (MULTIVITAMIN) capsule Take 1 capsule by mouth daily.   ondansetron (ZOFRAN-ODT) 4 MG disintegrating tablet Take 1 tablet (4 mg total) by mouth every 8 (eight) hours as needed for nausea or vomiting.   potassium chloride SA (KLOR-CON M) 20 MEQ tablet TAKE 1 TABLET BY MOUTH DAILY   Probiotic Product (PROBIOTIC-10 ULTIMATE PO) Take 1 tablet by mouth daily.   No facility-administered encounter medications on file as of 10/01/2023.   Physical Exam: Blood pressure 112/70, pulse 95, height 5' (1.524 m), weight 118 lb (53.5 kg), SpO2 97%. Gen:      No acute distress HEENT:  EOMI, sclera anicteric Neck:     No masses; no thyromegaly Lungs:    Clear to auscultation bilaterally; normal respiratory effort CV:         Regular rate and rhythm; no murmurs Abd:      + bowel sounds; soft, non-tender; no palpable masses, no distension Ext:    No edema; adequate peripheral perfusion Skin:      Warm and dry; no rash Neuro: alert and oriented x 3 Psych: normal mood and affect   Data Reviewed: Imaging: CT chest 02/09/2017- mild reticulation, interstitial prominence at the bases. CT chest 06/23/2018- groundglass attenuation, interlobular septal thickening, subpleural reticulation mild air trapping. CT chest 01/13/2019- peripheral and basilar pattern of subpleural reticulation, groundglass and traction bronchiectasis.  No honeycombing.  Probable UIP pattern. CT high-resolution 08/31/2019-stable pulmonary fibrosis probable UIP pattern CT high-resolution 09/06/2020-stable probable UIP pattern pulmonary fibrosis CT high-resolution 09/10/2021-stable pulmonary fibrosis, new 5 mm lung nodule CT high-resolution 09/17/2022-mild progression of probable UIP pulmonary fibrosis.  Left  apex lung nodule has resolved. CT high-resolution 11//2024-radiology read.  By my review her pattern of probable UIP appears stable.   I reviewed the images personally.  PFTs: 01/20/2019 FVC 2.17 [114%), FEV1 1.67 [120%],/F 77, TLC 78%, DLCO 56% Minimal restriction with moderate diffusion impairment.  06/05/2020 FVC 2.17 [119%], FEV1 1.68 [127%], F/F 77, TLC 4.36 [99%], DLCO 10.30 [65%] Mild diffusion impairment.  Improvement compared to 2020  03/28/2023 FVC 1.92 [190%], FEV1 1.50 [116%], F/F78, TLC 4.12 [92%], DLCO 9.83 [61%] Moderate diffusion impairment with progression compared to 2021  Labs: CCP 02/15/2016-24 Rheumatoid factor 02/14/2018-23.2  Cardiac: Echocardiogram 04/01/2022-mild reduction in RV systolic function, estimated RVSP 42.2, mildly elevated PA systolic pressure.  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  Other considerations include amiodarone toxicity but she has been off amiodarone for some time now.  Methotrexate can cause pulmonary fibrosis in this pattern as well. Would not recommend work-up for IPF such as lung biopsy as it would not change our recommended management.   Echo done by Dr. Antoine Poche reviewed with mild pulmonary hypertension.  Since she is doing well  clinically we will continue to monitor this closely.  Bedside she does not want invasive procedures such as a right heart catheterization  Did not tolerate Ofev but is doing well with esbriet.  She prefers to take 3 tablets 3 times a day as she does not like the bigger single dose tablet Finished pulmonary rehab and remains active with golf PFTs reviewed with mild progression which is expected for the disease.  CT scan earlier this month reviewed with stable findings.  Final radiology read is pending  -Continue Esbriet as prescribed. -Perform lung function tests at next visit in six months.  Rheumatoid  Arthritis Managed by Dr. Dierdre Forth with azathioprine 50mg . Recent labs in September at Dr. Shawnee Knapp office were normal.  Reviewed clinic visit notes and lab results -Continue azathioprine as prescribed. -Follow-up with Dr. Dierdre Forth as scheduled.  Lung nodule Resolved.  Review radiology read on her latest CT scan.  Recurrent Urinary Tract Infections Patient reports several episodes over the past few months. -Continue to monitor symptoms and seek treatment as needed.   General Health Maintenance -Received flu shot last week. -Plan to receive COVID-19 booster shot in the near future. -Continue to monitor for need of RSV and pneumonia vaccines.   Plan/Recommendations: - Continue brand-name Esbriet.  - Follow-up labs for monitoring - PFTs scan in 6 months  Chilton Greathouse MD [facility] 10/01/2023, 10:46 AM  CC: Raliegh Ip, DO

## 2023-10-02 ENCOUNTER — Other Ambulatory Visit: Payer: Self-pay | Admitting: Family Medicine

## 2023-10-02 ENCOUNTER — Other Ambulatory Visit: Payer: Self-pay | Admitting: Cardiology

## 2023-10-11 ENCOUNTER — Other Ambulatory Visit: Payer: Self-pay | Admitting: Family Medicine

## 2023-10-14 ENCOUNTER — Telehealth: Payer: Self-pay | Admitting: Family Medicine

## 2023-10-14 NOTE — Telephone Encounter (Signed)
Informed pt we do not have any samples at this time aware and verbalized understanding

## 2023-10-14 NOTE — Telephone Encounter (Signed)
Copied from CRM [id number]. Topic: Clinical - Medication Question >> Oct 14, 2023 10:35 AM Georgeanna Harrison H wrote: Reason for CRM: Pt wants to know if Dr.G has any Eliquis samples on hand. Please give a call back

## 2023-10-21 NOTE — Telephone Encounter (Signed)
Copied from CRM [id number]. Topic: Clinical - Medication Question >> Oct 21, 2023  8:26 AM Fonda Kinder J wrote: Reason for CRM: Patient wants know if any eliquis samples available, please give a callback

## 2023-10-21 NOTE — Telephone Encounter (Signed)
Patient aware and verbalized understanding. °

## 2023-10-22 DIAGNOSIS — M65331 Trigger finger, right middle finger: Secondary | ICD-10-CM | POA: Diagnosis not present

## 2023-10-22 DIAGNOSIS — N1831 Chronic kidney disease, stage 3a: Secondary | ICD-10-CM | POA: Diagnosis not present

## 2023-10-22 DIAGNOSIS — M0579 Rheumatoid arthritis with rheumatoid factor of multiple sites without organ or systems involvement: Secondary | ICD-10-CM | POA: Diagnosis not present

## 2023-10-22 DIAGNOSIS — M25512 Pain in left shoulder: Secondary | ICD-10-CM | POA: Diagnosis not present

## 2023-10-22 DIAGNOSIS — M5030 Other cervical disc degeneration, unspecified cervical region: Secondary | ICD-10-CM | POA: Diagnosis not present

## 2023-10-22 DIAGNOSIS — J849 Interstitial pulmonary disease, unspecified: Secondary | ICD-10-CM | POA: Diagnosis not present

## 2023-10-22 DIAGNOSIS — Z79899 Other long term (current) drug therapy: Secondary | ICD-10-CM | POA: Diagnosis not present

## 2023-10-22 DIAGNOSIS — M1991 Primary osteoarthritis, unspecified site: Secondary | ICD-10-CM | POA: Diagnosis not present

## 2023-10-22 DIAGNOSIS — R7989 Other specified abnormal findings of blood chemistry: Secondary | ICD-10-CM | POA: Diagnosis not present

## 2023-10-22 DIAGNOSIS — Z6821 Body mass index (BMI) 21.0-21.9, adult: Secondary | ICD-10-CM | POA: Diagnosis not present

## 2023-10-22 DIAGNOSIS — M5136 Other intervertebral disc degeneration, lumbar region with discogenic back pain only: Secondary | ICD-10-CM | POA: Diagnosis not present

## 2023-10-23 LAB — LAB REPORT - SCANNED: EGFR: 47

## 2023-10-24 DIAGNOSIS — R233 Spontaneous ecchymoses: Secondary | ICD-10-CM | POA: Diagnosis not present

## 2023-10-24 DIAGNOSIS — D485 Neoplasm of uncertain behavior of skin: Secondary | ICD-10-CM | POA: Diagnosis not present

## 2023-10-24 DIAGNOSIS — L570 Actinic keratosis: Secondary | ICD-10-CM | POA: Diagnosis not present

## 2023-10-24 DIAGNOSIS — C44529 Squamous cell carcinoma of skin of other part of trunk: Secondary | ICD-10-CM | POA: Diagnosis not present

## 2023-10-24 DIAGNOSIS — C44729 Squamous cell carcinoma of skin of left lower limb, including hip: Secondary | ICD-10-CM | POA: Diagnosis not present

## 2023-10-26 NOTE — Progress Notes (Unsigned)
Cardiology Office Note:   Date:  10/26/2023  ID:  Susan Fitzgerald, DOB 04/28/1932, MRN [id number] PCP: Raliegh Ip, DO  [facility] Providers Cardiologist:  None {  History of Present Illness:   Susan Fitzgerald is a 89 y.o. female who presents for evaluation of atrial fibrillation. She was cardioverted.  However, she developed recurrent atrial fib.  She failed flecainide.    She has been followed in the atrial fib clinic.  She did describe chest pain and was sent for [facility] which did not suggest ischemia.  She does have a mildly reduced EF (40 - 45% on echo.) Her last echo in March 2019 demonstrated an improved EF of 55 - 60%.    She was started on amiodarone and underwent DCCV. She had increased dyspnea and decreased O2 sats.  She was treated for pneumonia.  She was referred to pulmonary.  A CT was done.  There was a thought that she might have an amiodarone pulmonary process.  Other processes could not be excluded such as a reaction to her methotrexate.  There was also suggestion of pulmonary edema.  She has been treated with diuresis.   She did have an increased creat but this is improved.   She was treated with steroids and did improve.    She has not been told that she has idiopathic idiopathic pulmonary fibrosis.  She had cardioversion again in March 2020 which she probably only helped sinus rhythm for about 4 weeks.     Since I last saw her ***   ***  prior to hernia repair.  She comes today and she had no new complaints.  She does show me that her proBNP is elevated although it is lower than previous.  Despite that she is not having any new shortness of breath, PND or orthopnea.  She has had no new chest pressure, neck or arm discomfort.  She still does a little golfing.  ROS: ***  Studies Reviewed:    EKG:       ***  Risk Assessment/Calculations:   {Does this patient have ATRIAL FIBRILLATION?:[phone]} No BP recorded.  {Refresh Note OR Click here to enter  BP  :1}***        Physical Exam:   VS:  There were no vitals taken for this visit.   Wt Readings from Last 3 Encounters:  10/01/23 118 lb (53.5 kg)  05/27/23 120 lb (54.4 kg)  04/18/23 119 lb (54 kg)     GEN: Well nourished, well developed in no acute distress NECK: No JVD; No carotid bruits CARDIAC: ***RR, *** murmurs, rubs, gallops RESPIRATORY:  Clear to auscultation without rales, wheezing or rhonchi  ABDOMEN: Soft, non-tender, non-distended EXTREMITIES:  No edema; No deformity   ASSESSMENT AND PLAN:    HTN:    Her BP is *** well-controlled.  I reviewed her blood pressure diary.  No change in therapy.    ATRIAL FIB:   Ms. [name] has a CHA2DS2 - VASc score of 4.  ***  No change in therapy.  She tolerates anticoagulation.   DYSPNEA:  ***  This seems to be multifactorial.  I did review with her in detail the BNP level.  She does not like taking more diuretic.  Her blood pressure is well-controlled.  She is not having any new symptoms.  I think that we would not manage the BNP but rather symptoms and she understands the concept.  No change in diuretic.  Follow up ***  Signed, Rollene Rotunda, MD

## 2023-10-27 ENCOUNTER — Ambulatory Visit: Payer: Medicare Other | Admitting: Family Medicine

## 2023-10-28 ENCOUNTER — Telehealth: Payer: Self-pay | Admitting: Family Medicine

## 2023-10-28 NOTE — Telephone Encounter (Signed)
Copied from CRM [id number]. Topic: General - Other >> Oct 28, 2023  1:19 PM Danika B wrote: Reason for CRM: Patient wants know if any eliquis samples are available, was told to keep trying [place] back to see if in office. Callback [phone]

## 2023-10-28 NOTE — Telephone Encounter (Signed)
No samples at this time

## 2023-10-29 ENCOUNTER — Encounter: Payer: Self-pay | Admitting: Cardiology

## 2023-10-29 ENCOUNTER — Telehealth: Payer: Self-pay | Admitting: Cardiology

## 2023-10-29 ENCOUNTER — Ambulatory Visit: Payer: Medicare Other | Attending: Cardiology | Admitting: Cardiology

## 2023-10-29 VITALS — BP 114/82 | HR 106 | Ht 60.0 in | Wt 119.0 lb

## 2023-10-29 DIAGNOSIS — I482 Chronic atrial fibrillation, unspecified: Secondary | ICD-10-CM | POA: Diagnosis not present

## 2023-10-29 DIAGNOSIS — R0602 Shortness of breath: Secondary | ICD-10-CM | POA: Diagnosis not present

## 2023-10-29 DIAGNOSIS — I10 Essential (primary) hypertension: Secondary | ICD-10-CM

## 2023-10-29 DIAGNOSIS — I4819 Other persistent atrial fibrillation: Secondary | ICD-10-CM

## 2023-10-29 MED ORDER — APIXABAN 2.5 MG PO TABS: 2.5000 mg | ORAL_TABLET | Freq: Two times a day (BID) | ORAL | 0 refills | Status: DC

## 2023-10-29 MED ORDER — APIXABAN 2.5 MG PO TABS: 2.5000 mg | ORAL_TABLET | Freq: Two times a day (BID) | ORAL | 11 refills | Status: DC

## 2023-10-29 NOTE — Telephone Encounter (Signed)
Patient identification verified by 2 forms. Marilynn Rail, RN    Called and spoke to patient  Patient states:   -received 21 day supply for eliquis 2.5 mg today in office along with coupon card   -attempted to process pick at pharmacy, was informed no Rx on file RN placed new order for eliquis 2.5mg  BID to pharmacy  Patient has no further questions at this time

## 2023-10-29 NOTE — Telephone Encounter (Signed)
Pt c/o medication issue:  1. Name of Medication:   apixaban (ELIQUIS) 2.5 MG TABS tablet    2. How are you currently taking this medication (dosage and times per day)? As written   3. Are you having a reaction (difficulty breathing--STAT)?  no  4. What is your medication issue? Pt was given a card at her appt today but the pharmacy told her they don't have a prescription for Eliquis

## 2023-10-29 NOTE — Patient Instructions (Addendum)
Medication Instructions:  No changes. 21 day supply of samples for Eliquis 2.5mg  given in office with coupon.  *If you need a refill on your cardiac medications before your next appointment, please call your pharmacy*   Follow-Up: At [facility], you and your health needs are our priority.  As part of our continuing mission to provide you with exceptional heart care, we have created designated Provider Care Teams.  These Care Teams include your primary Cardiologist (physician) and Advanced Practice Providers (APPs -  Physician Assistants and Nurse Practitioners) who all work together to provide you with the care you need, when you need it.  Your next appointment:   12 month(s)  Provider:   Rollene Rotunda, MD     Other Instructions Please ask PCP to add CBC to next lab draw.

## 2023-11-05 ENCOUNTER — Telehealth: Payer: Self-pay

## 2023-11-05 ENCOUNTER — Telehealth: Payer: Self-pay | Admitting: Family Medicine

## 2023-11-05 NOTE — Telephone Encounter (Signed)
Informed pt we do not currently have any samples. Pt verbalized understanding

## 2023-11-05 NOTE — Telephone Encounter (Signed)
Copied from CRM [id number]. Topic: Clinical - Medical Advice >> Nov 05, 2023 11:11 AM Susan Fitzgerald wrote: Reason for CRM: Patient calling to see if the Nurse for Dr Reece Agar can check if the office has any samples of Eloquis. Patient's callback number is [phone].

## 2023-11-05 NOTE — Telephone Encounter (Signed)
   Copied from CRM [id number]. Topic: General - Other >> Nov 05, 2023 11:07 AM Gildardo Pounds wrote: Reason for CRM: Patient calling Raynelle Fanning to check the status of the submission for a Kennedy Bucker to get the Eloquis. Patient's callback number is [phone].

## 2023-11-06 ENCOUNTER — Other Ambulatory Visit: Payer: Self-pay | Admitting: Pharmacist

## 2023-11-06 DIAGNOSIS — J849 Interstitial pulmonary disease, unspecified: Secondary | ICD-10-CM

## 2023-11-06 MED ORDER — ESBRIET 267 MG PO TABS: 801.0000 mg | ORAL_TABLET | Freq: Three times a day (TID) | ORAL | 1 refills | Status: DC

## 2023-11-06 NOTE — Telephone Encounter (Signed)
Refill sent for ESBRIET to [facility]) for Esbriet: [phone]  Dose: 801mg  three times daily  Last OV: 10/01/2023 Provider: Dr. Isaiah Serge Pertinent labs: LFTs on 10/22/2023 wnl (in [facility] tab)  Next OV: due in 6 months, but not yet scheduled  Routing to scheduling team for follow-up on appt scheduling  Chesley Mires, PharmD, MPH, BCPS Clinical Pharmacist (Rheumatology and Pulmonology)

## 2023-11-07 ENCOUNTER — Encounter: Payer: Self-pay | Admitting: Family Medicine

## 2023-11-07 ENCOUNTER — Ambulatory Visit: Payer: Medicare Other | Admitting: Family Medicine

## 2023-11-07 VITALS — BP 125/70 | HR 89 | Temp 97.7°F | Ht 60.0 in | Wt 119.8 lb

## 2023-11-07 DIAGNOSIS — M546 Pain in thoracic spine: Secondary | ICD-10-CM | POA: Diagnosis not present

## 2023-11-07 DIAGNOSIS — M48062 Spinal stenosis, lumbar region with neurogenic claudication: Secondary | ICD-10-CM | POA: Diagnosis not present

## 2023-11-07 DIAGNOSIS — G8929 Other chronic pain: Secondary | ICD-10-CM

## 2023-11-07 MED ORDER — METHYLPREDNISOLONE ACETATE 40 MG/ML IJ SUSP
40.0000 mg | Freq: Once | INTRAMUSCULAR | Status: AC
  Administered 2023-11-07: 40 mg via INTRAMUSCULAR

## 2023-11-07 NOTE — Progress Notes (Signed)
Subjective: CC: Thoracic back pain PCP: Susan Ip, DO QMV:HQION Susan Fitzgerald is a 89 y.o. female presenting to clinic today for:  1.  Thoracic back pain Patient with known degenerative changes of her thoracic and lumbar spine.  She is under the care of a spinal specialist for this.  Her pain does respond some to Tylenol but she is not able to get sufficient control of her pain with that alone.  She is not able to take oral NSAIDs secondary to chronic anticoagulation.  She is taking gabapentin already at bedtime.   ROS: Per HPI  Allergies  Allergen Reactions   Benazepril Hcl Cough   Past Medical History:  Diagnosis Date   Calcification of bronchial airway 06/23/2018   01/2017 - See on CT imaging of the chest    Cataract    Cerebral vascular disease 06/19/2018   06/19/2018 - MRI with chronic microvascular ischemic change   Diverticulosis    Hypertension    Osteoporosis    PAF (paroxysmal atrial fibrillation) (HCC)    a. s/p prior DCCV; b. On flecainide & coumadin (CHA2DS2VASc = 4);  c. 03/2016 Echo: EF 55-60%, mod LVH, mod AI, mild to mod TR, PASP .   Pelvic fracture (HCC)    Pulmonary fibrosis (HCC) 2018   Rheumatoid arthritis (HCC) 2017    Current Outpatient Medications:    apixaban (ELIQUIS) 2.5 MG TABS tablet, Take 1 tablet (2.5 mg total) by mouth 2 (two) times daily., Disp: 21 tablet, Rfl: 0   apixaban (ELIQUIS) 2.5 MG TABS tablet, Take 1 tablet (2.5 mg total) by mouth 2 (two) times daily., Disp: 60 tablet, Rfl: 11   azaTHIOprine (IMURAN) 50 MG tablet, Take 50 mg by mouth daily. , Disp: , Rfl:    Calcium Carbonate-Vitamin D 600-10 MG-MCG TABS, Take 1 tablet by mouth 2 (two) times daily., Disp: , Rfl:    diltiazem (CARDIZEM CD) 300 MG 24 hr capsule, TAKE 1 CAPSULE BY MOUTH DAILY, Disp: 100 capsule, Rfl: 0   ESBRIET 267 MG TABS, Take 3 tablets (801 mg total) by mouth with breakfast, with lunch, and with evening meal., Disp: 810 tablet, Rfl: 1   furosemide (LASIX)  20 MG tablet, TAKE 1 TO 2 TABLETS BY MOUTH  DAILY AS NEEDED, Disp: 200 tablet, Rfl: 0   gabapentin (NEURONTIN) 100 MG capsule, Take 100 mg by mouth at bedtime. , Disp: , Rfl:    ketoconazole (NIZORAL) 2 % shampoo, Apply 1 Application topically daily as needed for irritation., Disp: , Rfl:    levothyroxine (SYNTHROID) 75 MCG tablet, TAKE 1 TABLET BY MOUTH DAILY  BEFORE BREAKFAST, Disp: 100 tablet, Rfl: 1   lisinopril (ZESTRIL) 20 MG tablet, TAKE 1 TABLET BY MOUTH DAILY, Disp: 100 tablet, Rfl: 2   lisinopril-hydrochlorothiazide (ZESTORETIC) 20-12.5 MG tablet, TAKE 1 TABLET BY MOUTH DAILY IN  THE MORNING, Disp: 100 tablet, Rfl: 2   Melatonin 5 MG TABS, Take 5 mg by mouth at bedtime., Disp: , Rfl:    Multiple Vitamin (MULTIVITAMIN) capsule, Take 1 capsule by mouth daily., Disp: , Rfl:    ondansetron (ZOFRAN-ODT) 4 MG disintegrating tablet, Take 1 tablet (4 mg total) by mouth every 8 (eight) hours as needed for nausea or vomiting., Disp: 20 tablet, Rfl: 0   potassium chloride SA (KLOR-CON M) 20 MEQ tablet, TAKE 1 TABLET BY MOUTH DAILY, Disp: 100 tablet, Rfl: 0   Probiotic Product (PROBIOTIC-10 ULTIMATE PO), Take 1 tablet by mouth daily., Disp: , Rfl:   Current Facility-Administered  Medications:    methylPREDNISolone acetate (DEPO-MEDROL) injection 40 mg, 40 mg, Intramuscular, Once,  Social History   Socioeconomic History   Marital status: Married    Spouse name: Susan Fitzgerald    Number of children: 2   Years of education: 12   Highest education level: 12th grade  Occupational History   Occupation: RETIRED  Tobacco Use   Smoking status: Never   Smokeless tobacco: Never  Vaping Use   Vaping status: Never Used  Substance and Sexual Activity   Alcohol use: No   Drug use: No   Sexual activity: Yes    Birth control/protection: Post-menopausal  Other Topics Concern   Not on file  Social History Narrative   Lives with her husband.   Enjoys playing golf.   Still very active and independent   Social  Drivers of Corporate investment banker Strain: Low Risk  (04/17/2023)   Overall Financial Resource Strain (CARDIA)    Difficulty of Paying Living Expenses: Not hard at all  Food Insecurity: No Food Insecurity (04/17/2023)   Hunger Vital Sign    Worried About Running Out of Food in the Last Year: Never true    Ran Out of Food in the Last Year: Never true  Transportation Needs: No Transportation Needs (04/17/2023)   PRAPARE - Administrator, Civil Service (Medical): No    Lack of Transportation (Non-Medical): No  Physical Activity: Sufficiently Active (04/17/2023)   Exercise Vital Sign    Days of Exercise per Week: 3 days    Minutes of Exercise per Session: 150+ min  Stress: No Stress Concern Present (04/17/2023)   [employer] of Occupational Health - Occupational Stress Questionnaire    Feeling of Stress : Not at all  Social Connections: Socially Integrated (04/17/2023)   Social Connection and Isolation Panel [NHANES]    Frequency of Communication with Friends and Family: More than three times a week    Frequency of Social Gatherings with Friends and Family: More than three times a week    Attends Religious Services: More than 4 times per year    Active Member of [employer] or Organizations: Yes    Attends Engineer, structural: More than 4 times per year    Marital Status: Married  Catering manager Violence: Not At Risk (11/05/2022)   Humiliation, Afraid, Rape, and Kick questionnaire    Fear of Current or Ex-Partner: No    Emotionally Abused: No    Physically Abused: No    Sexually Abused: No   Family History  Problem Relation Age of Onset   Stroke Mother        cerebral hemorrhage   Heart disease Father        MI   Heart attack Father    Vision loss Father    Heart disease Brother    Heart attack Brother    Cancer Maternal Aunt        breast   Cancer Brother    Heart disease Brother    Cancer Daughter        breast   Heart disease Son    Colon cancer  Neg Hx    Colon polyps Neg Hx    Rectal cancer Neg Hx    Stomach cancer Neg Hx     Objective: Office vital signs reviewed. BP 125/70   Pulse 89   Temp 97.7 F (36.5 C) (Oral)   Ht 5' (1.524 m)   Wt 119 lb 12.8 oz (54.3 kg)  SpO2 98%   BMI 23.40 kg/m   Physical Examination:  General: Awake, alert, nontoxic female, No acute distress MSK: Ambulating independently with hunched station.  Increased kyphosis of the thoracic spine present.  No midline tenderness palpation.  Assessment/ Plan: 89 y.o. female   Chronic bilateral thoracic back pain - Plan: methylPREDNISolone acetate (DEPO-MEDROL) injection 40 mg  Spinal stenosis of lumbar region with neurogenic claudication - Plan: methylPREDNISolone acetate (DEPO-MEDROL) injection 40 mg  Corticosteroid injection administered.  Continue Tylenol as needed.  Topical analgesics as needed.  Follow-up with neurosurgery   Susan Ip, DO [facility] [phone]

## 2023-11-14 NOTE — Telephone Encounter (Signed)
Left HIPAA compliant message informing patient no grants are available for her medication at this time, and that BMS would have to be tried again in 2025 once other documentation is met.   Call back [phone]

## 2023-11-17 ENCOUNTER — Encounter: Payer: Self-pay | Admitting: Family Medicine

## 2023-11-17 ENCOUNTER — Ambulatory Visit (INDEPENDENT_AMBULATORY_CARE_PROVIDER_SITE_OTHER): Payer: Medicare Other | Admitting: Family Medicine

## 2023-11-17 VITALS — BP 156/90 | HR 69 | Temp 98.6°F | Ht 60.0 in | Wt 116.6 lb

## 2023-11-17 DIAGNOSIS — Z0001 Encounter for general adult medical examination with abnormal findings: Secondary | ICD-10-CM

## 2023-11-17 DIAGNOSIS — I4819 Other persistent atrial fibrillation: Secondary | ICD-10-CM | POA: Diagnosis not present

## 2023-11-17 DIAGNOSIS — N1831 Chronic kidney disease, stage 3a: Secondary | ICD-10-CM | POA: Diagnosis not present

## 2023-11-17 DIAGNOSIS — I700 Atherosclerosis of aorta: Secondary | ICD-10-CM | POA: Diagnosis not present

## 2023-11-17 DIAGNOSIS — I10 Essential (primary) hypertension: Secondary | ICD-10-CM | POA: Diagnosis not present

## 2023-11-17 DIAGNOSIS — Z0000 Encounter for general adult medical examination without abnormal findings: Secondary | ICD-10-CM

## 2023-11-17 DIAGNOSIS — E039 Hypothyroidism, unspecified: Secondary | ICD-10-CM

## 2023-11-17 LAB — BAYER DCA HB A1C WAIVED: HB A1C (BAYER DCA - WAIVED): 5.3 % (ref 4.8–5.6)

## 2023-11-17 MED ORDER — LEVOTHYROXINE SODIUM 75 MCG PO TABS: 75.0000 ug | ORAL_TABLET | Freq: Every day | ORAL | 3 refills | Status: DC

## 2023-11-17 MED ORDER — FUROSEMIDE 20 MG PO TABS: 20.0000 mg | ORAL_TABLET | Freq: Every day | ORAL | 3 refills | Status: AC

## 2023-11-17 NOTE — Progress Notes (Signed)
Susan Fitzgerald is a 89 y.o. female presents to office today for annual physical exam examination.    Concerns today include: 1.  No concerns.  Wants to make sure that she gets Dr. Jenene Slicker labs today.  Occupation: Retired, Substance use: None Health Maintenance Due  Topic Date Due   COVID-19 Vaccine (4 - 2024-25 season) 07/20/2023   Medicare Annual Wellness (AWV)  11/06/2023   Refills needed today: None  Immunization History  Administered Date(s) Administered   Fluad Quad(high Dose 65+) 07/20/2019, 08/23/2020, 08/13/2021, 09/11/2022   Influenza, High Dose Seasonal PF 09/20/2016, 09/19/2017, 08/25/2018   Influenza,inj,Quad PF,6+ Mos 08/23/2013, 09/05/2014, 08/28/2015   Moderna SARS-COV2 Booster Vaccination 09/08/2020   PFIZER(Purple Top)SARS-COV-2 Vaccination 12/24/2019, 01/19/2020   Pfizer Covid-19 Vaccine Bivalent Booster 5y-11y 08/20/2021   Pneumococcal Conjugate-13 09/05/2014   Pneumococcal Polysaccharide-23 10/28/2018   Respiratory Syncytial Virus Vaccine,Recomb Aduvanted(Arexvy) 09/16/2022   Td 09/02/2011   Tdap 09/02/2011, 04/18/2023   Zoster Recombinant(Shingrix) 11/13/2018, 03/15/2019   Past Medical History:  Diagnosis Date   Calcification of bronchial airway 06/23/2018   01/2017 - See on CT imaging of the chest    Cataract    Cerebral vascular disease 06/19/2018   06/19/2018 - MRI with chronic microvascular ischemic change   Diverticulosis    Hypertension    Osteoporosis    PAF (paroxysmal atrial fibrillation) (HCC)    a. s/p prior DCCV; b. On flecainide & coumadin (CHA2DS2VASc = 4);  c. 03/2016 Echo: EF 55-60%, mod LVH, mod AI, mild to mod TR, PASP .   Pelvic fracture (HCC)    Pulmonary fibrosis (HCC) 2018   Rheumatoid arthritis (HCC) 2017   Social History   Socioeconomic History   Marital status: Married    Spouse name: Bill    Number of children: 2   Years of education: 12   Highest education level: 12th grade  Occupational History    Occupation: RETIRED  Tobacco Use   Smoking status: Never   Smokeless tobacco: Never  Vaping Use   Vaping status: Never Used  Substance and Sexual Activity   Alcohol use: No   Drug use: No   Sexual activity: Yes    Birth control/protection: Post-menopausal  Other Topics Concern   Not on file  Social History Narrative   Lives with her husband.   Enjoys playing golf.   Still very active and independent   Social Drivers of Corporate investment banker Strain: Low Risk  (04/17/2023)   Overall Financial Resource Strain (CARDIA)    Difficulty of Paying Living Expenses: Not hard at all  Food Insecurity: No Food Insecurity (04/17/2023)   Hunger Vital Sign    Worried About Running Out of Food in the Last Year: Never true    Ran Out of Food in the Last Year: Never true  Transportation Needs: No Transportation Needs (04/17/2023)   PRAPARE - Administrator, Civil Service (Medical): No    Lack of Transportation (Non-Medical): No  Physical Activity: Sufficiently Active (04/17/2023)   Exercise Vital Sign    Days of Exercise per Week: 3 days    Minutes of Exercise per Session: 150+ min  Stress: No Stress Concern Present (04/17/2023)   [employer] of Occupational Health - Occupational Stress Questionnaire    Feeling of Stress : Not at all  Social Connections: Socially Integrated (04/17/2023)   Social Connection and Isolation Panel [NHANES]    Frequency of Communication with Friends and Family: More than three times a week  Frequency of Social Gatherings with Friends and Family: More than three times a week    Attends Religious Services: More than 4 times per year    Active Member of Clubs or Organizations: Yes    Attends Banker Meetings: More than 4 times per year    Marital Status: Married  Catering manager Violence: Not At Risk (11/05/2022)   Humiliation, Afraid, Rape, and Kick questionnaire    Fear of Current or Ex-Partner: No    Emotionally Abused: No     Physically Abused: No    Sexually Abused: No   Past Surgical History:  Procedure Laterality Date   BIOPSY BREAST     BIOPSY BREAST     right & benign   BREAST SURGERY     CARDIOVERSION N/A 10/30/2016   Procedure: CARDIOVERSION;  Surgeon: Laurey Morale, MD;  Location: [facility] ENDOSCOPY;  Service: Cardiovascular;  Laterality: N/A;   CARDIOVERSION N/A 08/20/2017   Procedure: CARDIOVERSION;  Surgeon: Jake Bathe, MD;  Location: [facility] ENDOSCOPY;  Service: Cardiovascular;  Laterality: N/A;   CARDIOVERSION N/A 02/02/2020   Procedure: CARDIOVERSION;  Surgeon: Sande Rives, MD;  Location: [facility] ENDOSCOPY;  Service: Cardiovascular;  Laterality: N/A;   CATARACT EXTRACTION     COLONOSCOPY     EYE SURGERY     cataracts   INGUINAL HERNIA REPAIR Left 07/18/2022   Procedure: LAPAROSCOPIC LEFT INGUINAL HERNIA REPAIR WITH MESH POSSIBLE OPEN;  Surgeon: Axel Filler, MD;  Location: [facility] OR;  Service: General;  Laterality: Left;   TEE WITHOUT CARDIOVERSION N/A 10/30/2016   Procedure: TRANSESOPHAGEAL ECHOCARDIOGRAM (TEE);  Surgeon: Laurey Morale, MD;  Location: [facility] ENDOSCOPY;  Service: Cardiovascular;  Laterality: N/A;   TOTAL HIP ARTHROPLASTY Right 01/27/2017   Procedure: TOTAL HIP ARTHROPLASTY ANTERIOR APPROACH;  Surgeon: Samson Frederic, MD;  Location: [facility] OR;  Service: Orthopedics;  Laterality: Right;   Family History  Problem Relation Age of Onset   Stroke Mother        cerebral hemorrhage   Heart disease Father        MI   Heart attack Father    Vision loss Father    Heart disease Brother    Heart attack Brother    Cancer Maternal Aunt        breast   Cancer Brother    Heart disease Brother    Cancer Daughter        breast   Heart disease Son    Colon cancer Neg Hx    Colon polyps Neg Hx    Rectal cancer Neg Hx    Stomach cancer Neg Hx     Current Outpatient Medications:    apixaban (ELIQUIS) 2.5 MG TABS tablet, Take 1 tablet (2.5 mg total) by mouth 2 (two) times daily., Disp: 21  tablet, Rfl: 0   apixaban (ELIQUIS) 2.5 MG TABS tablet, Take 1 tablet (2.5 mg total) by mouth 2 (two) times daily., Disp: 60 tablet, Rfl: 11   azaTHIOprine (IMURAN) 50 MG tablet, Take 50 mg by mouth daily. , Disp: , Rfl:    Calcium Carbonate-Vitamin D 600-10 MG-MCG TABS, Take 1 tablet by mouth 2 (two) times daily., Disp: , Rfl:    diltiazem (CARDIZEM CD) 300 MG 24 hr capsule, TAKE 1 CAPSULE BY MOUTH DAILY, Disp: 100 capsule, Rfl: 0   ESBRIET 267 MG TABS, Take 3 tablets (801 mg total) by mouth with breakfast, with lunch, and with evening meal., Disp: 810 tablet, Rfl: 1   furosemide (LASIX) 20  MG tablet, TAKE 1 TO 2 TABLETS BY MOUTH  DAILY AS NEEDED, Disp: 200 tablet, Rfl: 0   gabapentin (NEURONTIN) 100 MG capsule, Take 100 mg by mouth at bedtime. , Disp: , Rfl:    ketoconazole (NIZORAL) 2 % shampoo, Apply 1 Application topically daily as needed for irritation., Disp: , Rfl:    levothyroxine (SYNTHROID) 75 MCG tablet, TAKE 1 TABLET BY MOUTH DAILY  BEFORE BREAKFAST, Disp: 100 tablet, Rfl: 1   lisinopril (ZESTRIL) 20 MG tablet, TAKE 1 TABLET BY MOUTH DAILY, Disp: 100 tablet, Rfl: 2   lisinopril-hydrochlorothiazide (ZESTORETIC) 20-12.5 MG tablet, TAKE 1 TABLET BY MOUTH DAILY IN  THE MORNING, Disp: 100 tablet, Rfl: 2   Melatonin 5 MG TABS, Take 5 mg by mouth at bedtime., Disp: , Rfl:    Multiple Vitamin (MULTIVITAMIN) capsule, Take 1 capsule by mouth daily., Disp: , Rfl:    ondansetron (ZOFRAN-ODT) 4 MG disintegrating tablet, Take 1 tablet (4 mg total) by mouth every 8 (eight) hours as needed for nausea or vomiting., Disp: 20 tablet, Rfl: 0   potassium chloride SA (KLOR-CON M) 20 MEQ tablet, TAKE 1 TABLET BY MOUTH DAILY, Disp: 100 tablet, Rfl: 0   Probiotic Product (PROBIOTIC-10 ULTIMATE PO), Take 1 tablet by mouth daily., Disp: , Rfl:   Allergies  Allergen Reactions   Benazepril Hcl Cough     ROS: Review of Systems A comprehensive review of systems was negative except for: Musculoskeletal:  positive for back pain    Physical exam BP (!) 156/90   Pulse 69   Temp 98.6 F (37 C)   Ht 5' (1.524 m)   Wt 116 lb 9.6 oz (52.9 kg)   SpO2 99%   BMI 22.77 kg/m  General appearance: alert, cooperative, appears stated age, and no distress Head: Normocephalic, without obvious abnormality, atraumatic Eyes: negative findings: lids and lashes normal, conjunctivae and sclerae normal, corneas clear, and pupils equal, round, reactive to light and accomodation Ears: normal TM's and external ear canals both ears Nose: Nares normal. Septum midline. Mucosa normal. No drainage or sinus tenderness. Throat: lips, mucosa, and tongue normal; teeth and gums normal Neck: no adenopathy, supple, symmetrical, trachea midline, and thyroid not enlarged, symmetric, no tenderness/mass/nodules Back:  Increased kyphosis of thoracic spine.  She has slight scoliosis present.  She is ambulatory independently. Lungs: clear to auscultation bilaterally Heart:  Irregularly irregular with rate control.  Has a soft murmur present Abdomen: soft, non-tender; bowel sounds normal; no masses,  no organomegaly Extremities: extremities normal, atraumatic, no cyanosis or edema Pulses: 2+ and symmetric Skin: Skin color, texture, turgor normal. No rashes or lesions Lymph nodes: Cervical, supraclavicular, and axillary nodes normal. Neurologic: Grossly normal      11/07/2023    3:20 PM 07/14/2023    2:09 PM 11/05/2022   11:22 AM  Depression screen PHQ 2/9  Decreased Interest 0 0 0  Down, Depressed, Hopeless 0 0 0  PHQ - 2 Score 0 0 0  Altered sleeping  0   Tired, decreased energy  0   Change in appetite  0   Feeling bad or failure about yourself   0   Trouble concentrating  0   Moving slowly or fidgety/restless  0   Suicidal thoughts  0   PHQ-9 Score  0   Difficult doing work/chores  Not difficult at all       11/17/2023   10:29 AM 07/14/2023    2:09 PM 04/18/2023   10:19 AM 10/14/2022   11:37  AM  GAD 7 :  Generalized Anxiety Score  Nervous, Anxious, on Edge 0 0 0 0  Control/stop worrying 0 0 0 0  Worry too much - different things 0 0 0 0  Trouble relaxing 0 0 0 0  Restless 0 0 0 0  Easily annoyed or irritable 0 0 0 0  Afraid - awful might happen 0 0 0 0  Total GAD 7 Score 0 0 0 0  Anxiety Difficulty Not difficult at all  Not difficult at all Not difficult at all     Assessment/ Plan: Susan Fitzgerald here for annual physical exam.   Annual physical exam  Atrial fibrillation, persistent (HCC) - Plan: CMP14+EGFR  Stage 3a chronic kidney disease (HCC) - Plan: CMP14+EGFR, Bayer DCA Hb A1c Waived, CANCELED: CBC  Aortic atherosclerosis (HCC) - Plan: Lipid Panel  Acquired hypothyroidism - Plan: TSH + free T4  Essential hypertension - Plan: CMP14+EGFR  Check fasting labs.  Blood pressure borderline but has not had blood pressure medicine today.  Check renal function given CKD.  Requested A1c.  No evidence of diabetes or prediabetes on this  She will continue current medications as prescribed.  Not currently treated with cholesterol medications.  Check thyroid levels.  Asymptomatic from a thyroid standpoint.  Continue Synthroid at current dose  Counseled on healthy lifestyle choices, including diet (rich in fruits, vegetables and lean meats and low in salt and simple carbohydrates) and exercise (at least 30 minutes of moderate physical activity daily).  Patient to follow up 6 months for thyroid check  Susan Mcraney M. Nadine Counts, DO

## 2023-11-18 LAB — CMP14+EGFR
ALT: 9 [IU]/L (ref 0–32)
AST: 23 [IU]/L (ref 0–40)
Albumin: 4.2 g/dL (ref 3.7–4.7)
Alkaline Phosphatase: 91 [IU]/L (ref 44–121)
BUN/Creatinine Ratio: 26 (ref 12–28)
BUN: 26 mg/dL (ref 8–27)
Bilirubin Total: 0.4 mg/dL (ref 0.0–1.2)
CO2: 24 mmol/L (ref 20–29)
Calcium: 9.7 mg/dL (ref 8.7–10.3)
Chloride: 100 mmol/L (ref 96–106)
Creatinine, Ser: 0.99 mg/dL (ref 0.57–1.00)
Globulin, Total: 2.6 g/dL (ref 1.5–4.5)
Glucose: 83 mg/dL (ref 70–99)
Potassium: 4 mmol/L (ref 3.5–5.2)
Sodium: 142 mmol/L (ref 134–144)
Total Protein: 6.8 g/dL (ref 6.0–8.5)
eGFR: 55 mL/min/{1.73_m2} — ABNORMAL LOW (ref >59)

## 2023-11-18 LAB — LIPID PANEL
Chol/HDL Ratio: 1.9 {ratio} (ref 0.0–4.4)
Cholesterol, Total: 185 mg/dL (ref 100–199)
HDL: 97 mg/dL (ref >39)
LDL Chol Calc (NIH): 78 mg/dL (ref 0–99)
Triglycerides: 48 mg/dL (ref 0–149)
VLDL Cholesterol Cal: 10 mg/dL (ref 5–40)

## 2023-11-18 LAB — TSH+FREE T4
Free T4: 1.34 ng/dL (ref 0.82–1.77)
TSH: 1.38 u[IU]/mL (ref 0.450–4.500)

## 2023-11-28 ENCOUNTER — Telehealth: Payer: Self-pay

## 2023-11-28 NOTE — Telephone Encounter (Signed)
 Patient with diagnosis of afib on Eliquis  for anticoagulation.    Procedure:  Lumbar medial branch block injection  Date of procedure: 12/03/23   CHA2DS2-VASc Score = 4   This indicates a 4.8% annual risk of stroke. The patient's score is based upon: CHF History: 0 HTN History: 1 Diabetes History: 0 Stroke History: 0 Vascular Disease History: 0 Age Score: 2 Gender Score: 1      CrCl 32 ml/min Platelet count 161  Per office protocol, patient can hold Eliquis  for 3 days prior to procedure.    **This guidance is not considered finalized until pre-operative APP has relayed final recommendations.**

## 2023-11-28 NOTE — Telephone Encounter (Signed)
   Pre-operative Risk Assessment    Patient Name: Susan Fitzgerald  DOB: 06/05/1932 MRN: [id number]   Date of last office visit: 10/29/23 Date of next office visit: none   Request for Surgical Clearance    Procedure:  Lumbar medial branch block injection   Date of Surgery:  Clearance 12/03/23                                 Surgeon:  Dr. Laqueta Surgeon's Group or Practice Name:  [facility] Phone number:  [phone] Fax number:  [phone]   Type of Clearance Requested:   - Pharmacy:  Hold Apixaban  (Eliquis ) 3 days before    Type of Anesthesia:  None    Additional requests/questions:    Bonney Connye GORMAN Rosalva   11/28/2023, 10:01 AM

## 2023-11-28 NOTE — Telephone Encounter (Signed)
   Name: ARMANI GAWLIK  DOB: 06-Apr-1932  MRN: [id number]   Primary Cardiologist: Lynwood Schilling, MD  Chart reviewed as part of pre-operative protocol coverage.   Per Pharm D, patient may hold Eliquis  for 3 days prior to procedure.    I will route this recommendation to the requesting party via Epic fax function and remove from pre-op pool. Please call with questions.  Barnie Hila, NP 11/28/2023, 11:37 AM

## 2023-12-03 ENCOUNTER — Telehealth: Payer: Self-pay | Admitting: Cardiology

## 2023-12-03 DIAGNOSIS — M47816 Spondylosis without myelopathy or radiculopathy, lumbar region: Secondary | ICD-10-CM | POA: Diagnosis not present

## 2023-12-10 ENCOUNTER — Ambulatory Visit: Payer: Medicare Other | Admitting: Family Medicine

## 2023-12-10 ENCOUNTER — Telehealth: Payer: Self-pay | Admitting: Family Medicine

## 2023-12-10 VITALS — BP 120/71 | HR 83 | Temp 98.2°F | Ht 60.0 in | Wt 118.0 lb

## 2023-12-10 DIAGNOSIS — R319 Hematuria, unspecified: Secondary | ICD-10-CM

## 2023-12-10 DIAGNOSIS — R300 Dysuria: Secondary | ICD-10-CM | POA: Diagnosis not present

## 2023-12-10 LAB — URINALYSIS, ROUTINE W REFLEX MICROSCOPIC
Bilirubin, UA: NEGATIVE
Glucose, UA: NEGATIVE
Ketones, UA: NEGATIVE
Nitrite, UA: NEGATIVE
Specific Gravity, UA: 1.02 (ref 1.005–1.030)
Urobilinogen, Ur: 0.2 mg/dL (ref 0.2–1.0)
pH, UA: 7 (ref 5.0–7.5)

## 2023-12-10 LAB — MICROSCOPIC EXAMINATION
Epithelial Cells (non renal): NONE SEEN /[HPF] (ref 0–10)
RBC, Urine: >30 /[HPF] — AB (ref 0–2)
Renal Epithel, UA: NONE SEEN /[HPF]
WBC, UA: >30 /[HPF] — AB (ref 0–5)

## 2023-12-10 MED ORDER — SULFAMETHOXAZOLE-TRIMETHOPRIM 800-160 MG PO TABS: 1.0000 | ORAL_TABLET | Freq: Two times a day (BID) | ORAL | 0 refills | Status: DC

## 2023-12-10 NOTE — Telephone Encounter (Signed)
For some reason, they did not collect Dr pennington's lab (cbc, [facility]).  These still need to be collected.  They are still pending in her file.

## 2023-12-10 NOTE — Progress Notes (Signed)
Subjective:  Patient ID: Susan Fitzgerald, female    DOB: 1932-01-03, 89 y.o.   MRN: [id number]  Patient Care Team: Raliegh Ip, DO as PCP - General (Family Medicine) Rollene Rotunda, MD as PCP - Cardiology (Cardiology) Cherlyn Roberts, MD as Consulting Physician (Dermatology) Donnetta Hail, MD as Consulting Physician (Rheumatology) Callie Fielding, MD as Consulting Physician (Physical Medicine and Rehabilitation) Danella Maiers, [facility] as Pharmacist (Family Medicine) Chilton Greathouse, MD as Consulting Physician (Pulmonary Disease) Rollene Rotunda, MD as Consulting Physician (Cardiology)   Chief Complaint:  Dysuria   HPI: MALONIE DEGREE is a 89 y.o. female presenting on 12/10/2023 for Dysuria   Dysuria  This is a new problem. The current episode started in the past 7 days. The problem occurs every urination. The quality of the pain is described as burning. The pain is at a severity of 3/10. The pain is mild. There has been no fever. Associated symptoms include frequency, hematuria, hesitancy and urgency. Pertinent negatives include no flank pain, nausea, possible pregnancy or vomiting. Associated symptoms comments: Incontinence, mainly at night. . Treatments tried: cranberry juice. The treatment provided no relief. Her past medical history is significant for recurrent UTIs.   Relevant past medical, surgical, family, and social history reviewed and updated as indicated.  Allergies and medications reviewed and updated. Data reviewed: Chart in Epic.   Past Medical History:  Diagnosis Date   Calcification of bronchial airway 06/23/2018   01/2017 - See on CT imaging of the chest    Cataract    Cerebral vascular disease 06/19/2018   06/19/2018 - MRI with chronic microvascular ischemic change   Diverticulosis    Hypertension    Osteoporosis    PAF (paroxysmal atrial fibrillation) (HCC)    a. s/p prior DCCV; b. On flecainide & coumadin (CHA2DS2VASc = 4);  c. 03/2016 Echo: EF  55-60%, mod LVH, mod AI, mild to mod TR, PASP .   Pelvic fracture (HCC)    Pulmonary fibrosis (HCC) 2018   Rheumatoid arthritis (HCC) 2017    Past Surgical History:  Procedure Laterality Date   BIOPSY BREAST     BIOPSY BREAST     right & benign   BREAST SURGERY     CARDIOVERSION N/A 10/30/2016   Procedure: CARDIOVERSION;  Surgeon: Laurey Morale, MD;  Location: [facility] ENDOSCOPY;  Service: Cardiovascular;  Laterality: N/A;   CARDIOVERSION N/A 08/20/2017   Procedure: CARDIOVERSION;  Surgeon: Jake Bathe, MD;  Location: [facility] ENDOSCOPY;  Service: Cardiovascular;  Laterality: N/A;   CARDIOVERSION N/A 02/02/2020   Procedure: CARDIOVERSION;  Surgeon: Sande Rives, MD;  Location: [facility] ENDOSCOPY;  Service: Cardiovascular;  Laterality: N/A;   CATARACT EXTRACTION     COLONOSCOPY     EYE SURGERY     cataracts   INGUINAL HERNIA REPAIR Left 07/18/2022   Procedure: LAPAROSCOPIC LEFT INGUINAL HERNIA REPAIR WITH MESH POSSIBLE OPEN;  Surgeon: Axel Filler, MD;  Location: [facility] OR;  Service: General;  Laterality: Left;   TEE WITHOUT CARDIOVERSION N/A 10/30/2016   Procedure: TRANSESOPHAGEAL ECHOCARDIOGRAM (TEE);  Surgeon: Laurey Morale, MD;  Location: [facility] ENDOSCOPY;  Service: Cardiovascular;  Laterality: N/A;   TOTAL HIP ARTHROPLASTY Right 01/27/2017   Procedure: TOTAL HIP ARTHROPLASTY ANTERIOR APPROACH;  Surgeon: Samson Frederic, MD;  Location: [facility] OR;  Service: Orthopedics;  Laterality: Right;    Social History   Socioeconomic History   Marital status: Married    Spouse name: Bill    Number of children: 2  Years of education: 53   Highest education level: 12th grade  Occupational History   Occupation: RETIRED  Tobacco Use   Smoking status: Never   Smokeless tobacco: Never  Vaping Use   Vaping status: Never Used  Substance and Sexual Activity   Alcohol use: No   Drug use: No   Sexual activity: Yes    Birth control/protection: Post-menopausal  Other Topics Concern   Not on file   Social History Narrative   Lives with her husband.   Enjoys playing golf.   Still very active and independent   Social Drivers of Corporate investment banker Strain: Low Risk  (12/10/2023)   Overall Financial Resource Strain (CARDIA)    Difficulty of Paying Living Expenses: Not hard at all  Food Insecurity: No Food Insecurity (12/10/2023)   Hunger Vital Sign    Worried About Running Out of Food in the Last Year: Never true    Ran Out of Food in the Last Year: Never true  Transportation Needs: No Transportation Needs (12/10/2023)   PRAPARE - Administrator, Civil Service (Medical): No    Lack of Transportation (Non-Medical): No  Physical Activity: Inactive (12/10/2023)   Exercise Vital Sign    Days of Exercise per Week: 0 days    Minutes of Exercise per Session: 150+ min  Stress: No Stress Concern Present (12/10/2023)   [employer] of Occupational Health - Occupational Stress Questionnaire    Feeling of Stress : Only a little  Social Connections: Socially Integrated (12/10/2023)   Social Connection and Isolation Panel [NHANES]    Frequency of Communication with Friends and Family: More than three times a week    Frequency of Social Gatherings with Friends and Family: Twice a week    Attends Religious Services: More than 4 times per year    Active Member of [employer] or Organizations: Yes    Attends Engineer, structural: More than 4 times per year    Marital Status: Married  Catering manager Violence: Not At Risk (11/05/2022)   Humiliation, Afraid, Rape, and Kick questionnaire    Fear of Current or Ex-Partner: No    Emotionally Abused: No    Physically Abused: No    Sexually Abused: No    Outpatient Encounter Medications as of 12/10/2023  Medication Sig   apixaban (ELIQUIS) 2.5 MG TABS tablet Take 1 tablet (2.5 mg total) by mouth 2 (two) times daily.   apixaban (ELIQUIS) 2.5 MG TABS tablet Take 1 tablet (2.5 mg total) by mouth 2 (two) times daily.    azaTHIOprine (IMURAN) 50 MG tablet Take 50 mg by mouth daily.    Calcium Carbonate-Vitamin D 600-10 MG-MCG TABS Take 1 tablet by mouth 2 (two) times daily.   diltiazem (CARDIZEM CD) 300 MG 24 hr capsule TAKE 1 CAPSULE BY MOUTH DAILY   ESBRIET 267 MG TABS Take 3 tablets (801 mg total) by mouth with breakfast, with lunch, and with evening meal.   furosemide (LASIX) 20 MG tablet Take 1 tablet (20 mg total) by mouth daily.   gabapentin (NEURONTIN) 100 MG capsule Take 100 mg by mouth at bedtime.    ketoconazole (NIZORAL) 2 % shampoo Apply 1 Application topically daily as needed for irritation.   levothyroxine (SYNTHROID) 75 MCG tablet Take 1 tablet (75 mcg total) by mouth daily before breakfast.   lisinopril (ZESTRIL) 20 MG tablet TAKE 1 TABLET BY MOUTH DAILY   lisinopril-hydrochlorothiazide (ZESTORETIC) 20-12.5 MG tablet TAKE 1 TABLET BY MOUTH DAILY  IN  THE MORNING   Melatonin 5 MG TABS Take 5 mg by mouth at bedtime.   Multiple Vitamin (MULTIVITAMIN) capsule Take 1 capsule by mouth daily.   ondansetron (ZOFRAN-ODT) 4 MG disintegrating tablet Take 1 tablet (4 mg total) by mouth every 8 (eight) hours as needed for nausea or vomiting.   potassium chloride SA (KLOR-CON M) 20 MEQ tablet TAKE 1 TABLET BY MOUTH DAILY   Probiotic Product (PROBIOTIC-10 ULTIMATE PO) Take 1 tablet by mouth daily.   No facility-administered encounter medications on file as of 12/10/2023.    Allergies  Allergen Reactions   Benazepril Hcl Cough    Review of Systems  Gastrointestinal:  Negative for nausea and vomiting.  Genitourinary:  Positive for dysuria, frequency, hematuria, hesitancy and urgency. Negative for flank pain.   Objective:  BP 120/71   Pulse 83   Temp 98.2 F (36.8 C)   Ht 5' (1.524 m)   Wt 118 lb (53.5 kg)   SpO2 97%   BMI 23.05 kg/m    Wt Readings from Last 3 Encounters:  12/10/23 118 lb (53.5 kg)  11/17/23 116 lb 9.6 oz (52.9 kg)  11/07/23 119 lb 12.8 oz (54.3 kg)   Physical  Exam Constitutional:      General: She is awake. She is not in acute distress.    Appearance: Normal appearance. She is well-developed and well-groomed. She is not ill-appearing, toxic-appearing or diaphoretic.  Cardiovascular:     Rate and Rhythm: Normal rate. Rhythm irregular.     Pulses: Normal pulses.          Radial pulses are 2+ on the right side and 2+ on the left side.       Posterior tibial pulses are 2+ on the right side and 2+ on the left side.     Heart sounds: Normal heart sounds. No murmur heard.    No gallop.  Pulmonary:     Effort: Pulmonary effort is normal. No respiratory distress.     Breath sounds: Normal breath sounds. No stridor. No wheezing, rhonchi or rales.  Abdominal:     General: Abdomen is flat. Bowel sounds are normal. There is no distension.     Palpations: Abdomen is soft. There is no mass.     Tenderness: There is no abdominal tenderness. There is no right CVA tenderness, left CVA tenderness, guarding or rebound.     Hernia: No hernia is present.  Musculoskeletal:     Cervical back: Full passive range of motion without pain and neck supple.     Right lower leg: No edema.     Left lower leg: No edema.  Skin:    General: Skin is warm.     Capillary Refill: Capillary refill takes less than 2 seconds.  Neurological:     General: No focal deficit present.     Mental Status: She is alert, oriented to person, place, and time and easily aroused. Mental status is at baseline.     GCS: GCS eye subscore is 4. GCS verbal subscore is 5. GCS motor subscore is 6.     Motor: No weakness.  Psychiatric:        Attention and Perception: Attention and perception normal.        Mood and Affect: Mood and affect normal.        Speech: Speech normal.        Behavior: Behavior normal. Behavior is cooperative.        Thought Content: Thought content  normal. Thought content does not include homicidal or suicidal ideation. Thought content does not include homicidal or suicidal  plan.        Cognition and Memory: Cognition and memory normal.        Judgment: Judgment normal.     Results for orders placed or performed in visit on 11/17/23  TSH + free T4   Collection Time: 11/17/23 11:16 AM  Result Value Ref Range   TSH 1.380 0.450 - 4.500 uIU/mL   Free T4 1.34 0.82 - 1.77 ng/dL  [id number]   Collection Time: 11/17/23 11:16 AM  Result Value Ref Range   Glucose 83 70 - 99 mg/dL   BUN 26 8 - 27 mg/dL   Creatinine, Ser 4.09 0.57 - 1.00 mg/dL   eGFR 55 (L) >81 XB/JYN/8.29   BUN/Creatinine Ratio 26 12 - 28   Sodium 142 134 - 144 mmol/L   Potassium 4.0 3.5 - 5.2 mmol/L   Chloride 100 96 - 106 mmol/L   CO2 24 20 - 29 mmol/L   Calcium 9.7 8.7 - 10.3 mg/dL   Total Protein 6.8 6.0 - 8.5 g/dL   Albumin 4.2 3.7 - 4.7 g/dL   Globulin, Total 2.6 1.5 - 4.5 g/dL   Bilirubin Total 0.4 0.0 - 1.2 mg/dL   Alkaline Phosphatase 91 44 - 121 IU/L   AST 23 0 - 40 IU/L   ALT 9 0 - 32 IU/L  Lipid Panel   Collection Time: 11/17/23 11:16 AM  Result Value Ref Range   Cholesterol, Total 185 100 - 199 mg/dL   Triglycerides 48 0 - 149 mg/dL   HDL 97 >56 mg/dL   VLDL Cholesterol Cal 10 5 - 40 mg/dL   LDL Chol Calc (NIH) 78 0 - 99 mg/dL   Chol/HDL Ratio 1.9 0.0 - 4.4 ratio  Bayer DCA Hb A1c Waived   Collection Time: 11/17/23 11:26 AM  Result Value Ref Range   HB A1C (BAYER DCA - WAIVED) 5.3 4.8 - 5.6 %   *Note: Due to a large number of results and/or encounters for the requested time period, some results have not been displayed. A complete set of results can be found in Results Review.       12/10/2023   11:18 AM 11/17/2023   10:29 AM 11/07/2023    3:20 PM 07/14/2023    2:09 PM 11/05/2022   11:22 AM  Depression screen PHQ 2/9  Decreased Interest 0 0 0 0 0  Down, Depressed, Hopeless 0 0 0 0 0  PHQ - 2 Score 0 0 0 0 0  Altered sleeping 1 0  0   Tired, decreased energy 0 0  0   Change in appetite 1 0  0   Feeling bad or failure about yourself  0 0  0   Trouble  concentrating 0 0  0   Moving slowly or fidgety/restless 0 0  0   Suicidal thoughts 0 0  0   PHQ-9 Score 2 0  0   Difficult doing work/chores  Not difficult at all  Not difficult at all        12/10/2023   11:18 AM 11/17/2023   10:29 AM 07/14/2023    2:09 PM 04/18/2023   10:19 AM  GAD 7 : Generalized Anxiety Score  Nervous, Anxious, on Edge 0 0 0 0  Control/stop worrying 0 0 0 0  Worry too much - different things 0 0 0 0  Trouble relaxing 0 0 0  0  Restless 0 0 0 0  Easily annoyed or irritable 0 0 0 0  Afraid - awful might happen 0 0 0 0  Total GAD 7 Score 0 0 0 0  Anxiety Difficulty Not difficult at all Not difficult at all  Not difficult at all      Pertinent labs & imaging results that were available during my care of the patient were reviewed by me and considered in my medical decision making.  Assessment & Plan:  Courteney was seen today for dysuria.  Diagnoses and all orders for this visit:  1. Dysuria (Primary) Based on labs, will treat for infection. Discussed with patient that we will wait for culture to determine if abx needs to change. Instructed patient to follow up in 2 weeks for repeat UA due to hematuria.  - Urinalysis, Routine w reflex microscopic - Urine Culture - sulfamethoxazole-trimethoprim (BACTRIM DS) 800-160 MG tablet; Take 1 tablet by mouth 2 (two) times daily.  Dispense: 6 tablet; Refill: 0 - Urinalysis, Routine w reflex microscopic; Future - Microscopic Examination  2. Hematuria, unspecified type As above.  - sulfamethoxazole-trimethoprim (BACTRIM DS) 800-160 MG tablet; Take 1 tablet by mouth 2 (two) times daily.  Dispense: 6 tablet; Refill: 0 - Urinalysis, Routine w reflex microscopic; Future - Microscopic Examination   Continue all other maintenance medications.  Follow up plan: Return if symptoms worsen or fail to improve.   Continue healthy lifestyle choices, including diet (rich in fruits, vegetables, and lean proteins, and low in salt and  simple carbohydrates) and exercise (at least 30 minutes of moderate physical activity daily).  Written and verbal instructions provided   The above assessment and management plan was discussed with the patient. The patient verbalized understanding of and has agreed to the management plan. Patient is aware to call the clinic if they develop any new symptoms or if symptoms persist or worsen. Patient is aware when to return to the clinic for a follow-up visit. Patient educated on when it is appropriate to go to the emergency department.   Neale Burly, DNP-FNP [facility] [address] [place], [place] [address] [phone]

## 2023-12-10 NOTE — Telephone Encounter (Signed)
Apt scheduled.  

## 2023-12-11 ENCOUNTER — Telehealth: Payer: Self-pay | Admitting: Cardiology

## 2023-12-11 ENCOUNTER — Other Ambulatory Visit: Payer: Medicare Other

## 2023-12-11 ENCOUNTER — Telehealth: Payer: Self-pay | Admitting: *Deleted

## 2023-12-11 ENCOUNTER — Other Ambulatory Visit: Payer: Self-pay | Admitting: Cardiology

## 2023-12-11 DIAGNOSIS — D696 Thrombocytopenia, unspecified: Secondary | ICD-10-CM

## 2023-12-11 NOTE — Addendum Note (Signed)
Addended byViviann Spare on: 12/11/2023 10:00 AM   Modules accepted: Orders

## 2023-12-11 NOTE — Telephone Encounter (Signed)
 Attempted to call patient, no answer left message requesting a call back.

## 2023-12-11 NOTE — Telephone Encounter (Signed)
  Patient calling the office for samples of medication:   1.  What medication and dosage are you requesting samples for?  apixaban (ELIQUIS) 2.5 MG TABS tablet   2.  Are you currently out of this medication? Patient went to fill this medication and it was too expensive. She was wondering if she could either get samples or if there is any assistance she can get. She still has a couple of weeks left right now of her medication.

## 2023-12-11 NOTE — Telephone Encounter (Signed)
Pharmacy please advise on holding Eliquis prior to lumbar medial branch block scheduled for 12/29/2022. Thank you.

## 2023-12-11 NOTE — Telephone Encounter (Signed)
   Pre-operative Risk Assessment    Patient Name: Susan Fitzgerald  DOB: 1932/11/04 MRN: [id number]   Date of last office visit: 10/29/2023 Date of next office visit: None   Request for Surgical Clearance    Procedure:   Lumbar Medial Branch Block  Date of Surgery:  Clearance 12/29/22                                 Surgeon:  Dr. Drucie Ip Surgeon's Group or Practice Name:  [facility] Phone number:  [phone] Fax number:  [phone]   Type of Clearance Requested:   - Medical  - Pharmacy:  Hold Apixaban (Eliquis) 3 days prior.   Type of Anesthesia:  Not Indicated   Additional requests/questions:    Signed, Emmit Pomfret   12/11/2023, 3:40 PM

## 2023-12-11 NOTE — Telephone Encounter (Signed)
Called and spoke to patient. Verified name and DOB. Patient is requesting samples of Eliquis. She stated when she went to pick up her Eliquis it was almost $400 for 90 day supply. She will call her insurance company to find out what her deductible is and copay after she meet her deductible. Informed patient she will have to meet her deductible before her copay will go down. Please advise.

## 2023-12-11 NOTE — Telephone Encounter (Signed)
Patient returned RN's call regarding samples.

## 2023-12-12 ENCOUNTER — Encounter: Payer: Self-pay | Admitting: Family Medicine

## 2023-12-12 LAB — COMPREHENSIVE METABOLIC PANEL
ALT: 10 [IU]/L (ref 0–32)
AST: 19 [IU]/L (ref 0–40)
Albumin: 3.7 g/dL (ref 3.7–4.7)
Alkaline Phosphatase: 78 [IU]/L (ref 44–121)
BUN/Creatinine Ratio: 19 (ref 12–28)
BUN: 25 mg/dL (ref 8–27)
Bilirubin Total: 0.2 mg/dL (ref 0.0–1.2)
CO2: 26 mmol/L (ref 20–29)
Calcium: 9.6 mg/dL (ref 8.7–10.3)
Chloride: 102 mmol/L (ref 96–106)
Creatinine, Ser: 1.34 mg/dL — ABNORMAL HIGH (ref 0.57–1.00)
Globulin, Total: 2.5 g/dL (ref 1.5–4.5)
Glucose: 61 mg/dL — ABNORMAL LOW (ref 70–99)
Potassium: 3.7 mmol/L (ref 3.5–5.2)
Sodium: 142 mmol/L (ref 134–144)
Total Protein: 6.2 g/dL (ref 6.0–8.5)
eGFR: 38 mL/min/{1.73_m2} — ABNORMAL LOW (ref >59)

## 2023-12-12 LAB — CBC WITH DIFFERENTIAL/PLATELET
Basophils Absolute: 0 10*3/uL (ref 0.0–0.2)
Basos: 1 %
EOS (ABSOLUTE): 0.1 10*3/uL (ref 0.0–0.4)
Eos: 2 %
Hematocrit: 37.5 % (ref 34.0–46.6)
Hemoglobin: 12.7 g/dL (ref 11.1–15.9)
Immature Grans (Abs): 0 10*3/uL (ref 0.0–0.1)
Immature Granulocytes: 0 %
Lymphocytes Absolute: 1.2 10*3/uL (ref 0.7–3.1)
Lymphs: 25 %
MCH: 33.5 pg — ABNORMAL HIGH (ref 26.6–33.0)
MCHC: 33.9 g/dL (ref 31.5–35.7)
MCV: 99 fL — ABNORMAL HIGH (ref 79–97)
Monocytes Absolute: 0.6 10*3/uL (ref 0.1–0.9)
Monocytes: 12 %
Neutrophils Absolute: 3 10*3/uL (ref 1.4–7.0)
Neutrophils: 60 %
Platelets: 147 10*3/uL — ABNORMAL LOW (ref 150–450)
RBC: 3.79 x10E6/uL (ref 3.77–5.28)
RDW: 12.1 % (ref 11.7–15.4)
WBC: 4.9 10*3/uL (ref 3.4–10.8)

## 2023-12-12 LAB — URINE CULTURE

## 2023-12-12 LAB — LACTATE DEHYDROGENASE: LDH: 186 [IU]/L (ref 119–226)

## 2023-12-12 MED ORDER — APIXABAN 2.5 MG PO TABS: 2.5000 mg | ORAL_TABLET | Freq: Two times a day (BID) | ORAL | 3 refills | Status: DC

## 2023-12-12 MED ORDER — DILTIAZEM HCL ER COATED BEADS 300 MG PO CP24: 300.0000 mg | ORAL_CAPSULE | Freq: Every day | ORAL | 3 refills | Status: DC

## 2023-12-12 MED ORDER — FLUCONAZOLE 150 MG PO TABS: 150.0000 mg | ORAL_TABLET | Freq: Once | ORAL | 0 refills | Status: AC

## 2023-12-12 NOTE — Telephone Encounter (Signed)
   Patient Name: Susan Fitzgerald  DOB: 1931/12/02 MRN: [id number]  Primary Cardiologist: Rollene Rotunda, MD  Chart reviewed as part of pre-operative protocol coverage. Given past medical history and time since last visit, based on ACC/AHA guidelines, [name] is at acceptable risk for the planned procedure without further cardiovascular testing.   Per Dr. Antoine Poche, "The patient is at acceptable risk for the planned procedure without further cardiovascular testing.  She can hold her DOAC for 3 days prior."  Per office protocol, patient can hold Eliquis for 3 days prior to procedure. Please resume Eliquis as soon as possible postprocedure, at the discretion of the surgeon.   I will route this recommendation to the requesting party via Epic fax function and remove from pre-op pool.  Please call with questions.  Joylene Grapes, NP 12/12/2023, 1:00 PM

## 2023-12-12 NOTE — Progress Notes (Signed)
Negative for UTI. However, yeast present on exam. Will send in diflucan for patient. patient to follow up in 2 weeks for repeat UA due to hematuria.

## 2023-12-12 NOTE — Telephone Encounter (Signed)
Patient with diagnosis of afib on Eliquis for anticoagulation.    Procedure: Lumbar Medial Branch Block  Date of procedure: 12/29/22   CHA2DS2-VASc Score = 4   This indicates a 4.8% annual risk of stroke. The patient's score is based upon: CHF History: 0 HTN History: 1 Diabetes History: 0 Stroke History: 0 Vascular Disease History: 0 Age Score: 2 Gender Score: 1     CrCl  24 mL/min Platelet count 147 K  Per office protocol, patient can hold Eliquis for 3 days prior to procedure.     **This guidance is not considered finalized until pre-operative APP has relayed final recommendations.**

## 2023-12-12 NOTE — Telephone Encounter (Signed)
There isn't any assistance that she would qualify for. She should do what you told her too. Call her insurance, find out her deductible and copay going forward. She can sign up for payment plan if needed. Cannot give samples

## 2023-12-12 NOTE — Telephone Encounter (Signed)
Called and spoke to patient. Verified name and DOB. Informed patient we could not provide samples. She did not call her insurance to verify deductible. Patient said she will call today to see about getting on a payment plan. I did send in a new prescription for Eliqus to [facility] for 90 day supply with 3 refills.

## 2023-12-12 NOTE — Addendum Note (Signed)
Addended by: Neale Burly on: 12/12/2023 08:30 AM   Modules accepted: Orders

## 2023-12-15 ENCOUNTER — Telehealth: Payer: Self-pay | Admitting: Family Medicine

## 2023-12-15 NOTE — Telephone Encounter (Signed)
Copied from CRM [id number]. Topic: General - Other >> Dec 12, 2023  4:57 PM Suzette B wrote: Reason for CRM: Patient is calling to find out whether or not she needs to do the labs again after recently doing labs. Patient states she knows she gets labs during her annual/physical but will she need it again. Patient as requested a call back @ [phone]

## 2023-12-15 NOTE — Telephone Encounter (Signed)
I don't need anything additional.

## 2023-12-20 ENCOUNTER — Other Ambulatory Visit: Payer: Self-pay | Admitting: Family Medicine

## 2023-12-24 ENCOUNTER — Other Ambulatory Visit: Payer: Medicare Other

## 2023-12-24 ENCOUNTER — Encounter: Payer: Self-pay | Admitting: Family Medicine

## 2023-12-24 DIAGNOSIS — R319 Hematuria, unspecified: Secondary | ICD-10-CM

## 2023-12-24 DIAGNOSIS — R300 Dysuria: Secondary | ICD-10-CM

## 2023-12-24 LAB — URINALYSIS, ROUTINE W REFLEX MICROSCOPIC
Bilirubin, UA: NEGATIVE
Glucose, UA: NEGATIVE
Ketones, UA: NEGATIVE
Leukocytes,UA: NEGATIVE
Nitrite, UA: NEGATIVE
Protein,UA: NEGATIVE
RBC, UA: NEGATIVE
Specific Gravity, UA: 1.015 (ref 1.005–1.030)
Urobilinogen, Ur: 0.2 mg/dL (ref 0.2–1.0)
pH, UA: 7.5 (ref 5.0–7.5)

## 2023-12-24 NOTE — Progress Notes (Signed)
 UA normal.

## 2023-12-25 DIAGNOSIS — C44729 Squamous cell carcinoma of skin of left lower limb, including hip: Secondary | ICD-10-CM | POA: Diagnosis not present

## 2023-12-25 DIAGNOSIS — D485 Neoplasm of uncertain behavior of skin: Secondary | ICD-10-CM | POA: Diagnosis not present

## 2023-12-25 DIAGNOSIS — C44622 Squamous cell carcinoma of skin of right upper limb, including shoulder: Secondary | ICD-10-CM | POA: Diagnosis not present

## 2023-12-30 ENCOUNTER — Ambulatory Visit (INDEPENDENT_AMBULATORY_CARE_PROVIDER_SITE_OTHER): Payer: Medicare Other

## 2023-12-30 ENCOUNTER — Telehealth: Payer: Self-pay

## 2023-12-30 VITALS — BP 122/74 | Ht 60.0 in | Wt 118.0 lb

## 2023-12-30 DIAGNOSIS — N1831 Chronic kidney disease, stage 3a: Secondary | ICD-10-CM

## 2023-12-30 DIAGNOSIS — Z0000 Encounter for general adult medical examination without abnormal findings: Secondary | ICD-10-CM | POA: Diagnosis not present

## 2023-12-30 NOTE — Telephone Encounter (Signed)
Patient seen for AWV and states that she had labs ordered by Dr. Buford Dresser completed on 12/11/23 and would like to know PCPs opinion on if she should keep appointment scheduled with Dr. Buford Dresser or if she can discontinue yearly visits with her.

## 2023-12-30 NOTE — Progress Notes (Signed)
Subjective:   Susan Fitzgerald is a 89 y.o. female who presents for Medicare Annual (Subsequent) preventive examination.  Visit Complete: Virtual I connected with  Susan Fitzgerald on 12/30/23 by a audio enabled telemedicine application and verified that I am speaking with the correct person using two identifiers.  Patient Location: Home  Provider Location: Home Office  This patient declined Interactive audio and video telecommunications. Therefore the visit was completed with audio only.  I discussed the limitations of evaluation and management by telemedicine. The patient expressed understanding and agreed to proceed.  Vital Signs: Because this visit was a virtual/telehealth visit, some criteria may be missing or patient reported. Any vitals not documented were not able to be obtained and vitals that have been documented are patient reported.  Cardiac Risk Factors include: advanced age (>51men, >107 women);hypertension     Objective:    Today's Vitals   12/30/23 1601  BP: 122/74  Weight: 118 lb (53.5 kg)  Height: 5' (1.524 m)   Body mass index is 23.05 kg/m.     12/30/2023    4:10 PM 02/17/2023   10:05 AM 12/13/2022   10:18 AM 11/05/2022   11:24 AM 07/18/2022    9:13 AM 02/25/2022   10:54 AM 11/02/2021   11:32 AM  Advanced Directives  Does Patient Have a Medical Advance Directive? No Yes No Yes Yes No No  Type of Special educational needs teacher of [place];Living will  Healthcare Power of [place];Living will Healthcare Power of [place];Living will    Does patient want to make changes to medical advance directive?  No - Patient declined       Copy of Healthcare Power of Attorney in Chart?  No - copy requested  No - copy requested     Would patient like information on creating a medical advance directive? Yes (MAU/Ambulatory/Procedural Areas - Information given)  No - Patient declined   No - Patient declined No - Patient declined    Current Medications  (verified) Outpatient Encounter Medications as of 12/30/2023  Medication Sig   apixaban (ELIQUIS) 2.5 MG TABS tablet Take 2.5 mg by mouth 2 (two) times daily.   azaTHIOprine (IMURAN) 50 MG tablet Take 50 mg by mouth daily.    Calcium Carbonate-Vitamin D 600-10 MG-MCG TABS Take 1 tablet by mouth 2 (two) times daily.   diltiazem (CARDIZEM CD) 300 MG 24 hr capsule Take 1 capsule (300 mg total) by mouth daily.   ESBRIET 267 MG TABS Take 3 tablets (801 mg total) by mouth with breakfast, with lunch, and with evening meal.   furosemide (LASIX) 20 MG tablet Take 1 tablet (20 mg total) by mouth daily.   gabapentin (NEURONTIN) 100 MG capsule Take 100 mg by mouth at bedtime.    ketoconazole (NIZORAL) 2 % shampoo Apply 1 Application topically daily as needed for irritation.   levothyroxine (SYNTHROID) 75 MCG tablet Take 1 tablet (75 mcg total) by mouth daily before breakfast.   lisinopril (ZESTRIL) 20 MG tablet TAKE 1 TABLET BY MOUTH DAILY   lisinopril-hydrochlorothiazide (ZESTORETIC) 20-12.5 MG tablet TAKE 1 TABLET BY MOUTH DAILY IN  THE MORNING   Melatonin 5 MG TABS Take 5 mg by mouth at bedtime.   Multiple Vitamin (MULTIVITAMIN) capsule Take 1 capsule by mouth daily.   ondansetron (ZOFRAN-ODT) 4 MG disintegrating tablet Take 1 tablet (4 mg total) by mouth every 8 (eight) hours as needed for nausea or vomiting.   potassium chloride SA (KLOR-CON M) 20 MEQ tablet TAKE  1 TABLET BY MOUTH DAILY   Probiotic Product (PROBIOTIC-10 ULTIMATE PO) Take 1 tablet by mouth daily.   sulfamethoxazole-trimethoprim (BACTRIM DS) 800-160 MG tablet Take 1 tablet by mouth 2 (two) times daily.   [DISCONTINUED] apixaban (ELIQUIS) 2.5 MG TABS tablet Take 1 tablet (2.5 mg total) by mouth 2 (two) times daily.   [DISCONTINUED] apixaban (ELIQUIS) 2.5 MG TABS tablet Take 1 tablet (2.5 mg total) by mouth 2 (two) times daily.   No facility-administered encounter medications on file as of 12/30/2023.    Allergies (verified) Benazepril  hcl   History: Past Medical History:  Diagnosis Date   Calcification of bronchial airway 06/23/2018   01/2017 - See on CT imaging of the chest    Cataract    Cerebral vascular disease 06/19/2018   06/19/2018 - MRI with chronic microvascular ischemic change   Diverticulosis    Hypertension    Osteoporosis    PAF (paroxysmal atrial fibrillation) (HCC)    a. s/p prior DCCV; b. On flecainide & coumadin (CHA2DS2VASc = 4);  c. 03/2016 Echo: EF 55-60%, mod LVH, mod AI, mild to mod TR, PASP .   Pelvic fracture (HCC)    Pulmonary fibrosis (HCC) 2018   Rheumatoid arthritis (HCC) 2017   Past Surgical History:  Procedure Laterality Date   BIOPSY BREAST     BIOPSY BREAST     right & benign   BREAST SURGERY     CARDIOVERSION N/A 10/30/2016   Procedure: CARDIOVERSION;  Surgeon: Laurey Morale, MD;  Location: [facility] ENDOSCOPY;  Service: Cardiovascular;  Laterality: N/A;   CARDIOVERSION N/A 08/20/2017   Procedure: CARDIOVERSION;  Surgeon: Jake Bathe, MD;  Location: [facility] ENDOSCOPY;  Service: Cardiovascular;  Laterality: N/A;   CARDIOVERSION N/A 02/02/2020   Procedure: CARDIOVERSION;  Surgeon: Sande Rives, MD;  Location: [facility] ENDOSCOPY;  Service: Cardiovascular;  Laterality: N/A;   CATARACT EXTRACTION     COLONOSCOPY     EYE SURGERY     cataracts   INGUINAL HERNIA REPAIR Left 07/18/2022   Procedure: LAPAROSCOPIC LEFT INGUINAL HERNIA REPAIR WITH MESH POSSIBLE OPEN;  Surgeon: Axel Filler, MD;  Location: [facility] OR;  Service: General;  Laterality: Left;   TEE WITHOUT CARDIOVERSION N/A 10/30/2016   Procedure: TRANSESOPHAGEAL ECHOCARDIOGRAM (TEE);  Surgeon: Laurey Morale, MD;  Location: [facility] ENDOSCOPY;  Service: Cardiovascular;  Laterality: N/A;   TOTAL HIP ARTHROPLASTY Right 01/27/2017   Procedure: TOTAL HIP ARTHROPLASTY ANTERIOR APPROACH;  Surgeon: Samson Frederic, MD;  Location: [facility] OR;  Service: Orthopedics;  Laterality: Right;   Family History  Problem Relation Age of Onset   Stroke  Mother        cerebral hemorrhage   Heart disease Father        MI   Heart attack Father    Vision loss Father    Heart disease Brother    Heart attack Brother    Cancer Maternal Aunt        breast   Cancer Brother    Heart disease Brother    Cancer Daughter        breast   Heart disease Son    Colon cancer Neg Hx    Colon polyps Neg Hx    Rectal cancer Neg Hx    Stomach cancer Neg Hx    Social History   Socioeconomic History   Marital status: Married    Spouse name: Bill    Number of children: 2   Years of education: 12   Highest education level:  12th grade  Occupational History   Occupation: RETIRED  Tobacco Use   Smoking status: Never   Smokeless tobacco: Never  Vaping Use   Vaping status: Never Used  Substance and Sexual Activity   Alcohol use: No   Drug use: No   Sexual activity: Yes    Birth control/protection: Post-menopausal  Other Topics Concern   Not on file  Social History Narrative   Lives with her husband.   Enjoys playing golf.   Still very active and independent   Social Drivers of Corporate investment banker Strain: Low Risk  (12/30/2023)   Overall Financial Resource Strain (CARDIA)    Difficulty of Paying Living Expenses: Not hard at all  Food Insecurity: No Food Insecurity (12/30/2023)   Hunger Vital Sign    Worried About Running Out of Food in the Last Year: Never true    Ran Out of Food in the Last Year: Never true  Transportation Needs: No Transportation Needs (12/30/2023)   PRAPARE - Administrator, Civil Service (Medical): No    Lack of Transportation (Non-Medical): No  Physical Activity: Insufficiently Active (12/30/2023)   Exercise Vital Sign    Days of Exercise per Week: 3 days    Minutes of Exercise per Session: 30 min  Stress: No Stress Concern Present (12/30/2023)   [employer] of Occupational Health - Occupational Stress Questionnaire    Feeling of Stress : Only a little  Social Connections: Socially  Integrated (12/30/2023)   Social Connection and Isolation Panel [NHANES]    Frequency of Communication with Friends and Family: More than three times a week    Frequency of Social Gatherings with Friends and Family: Twice a week    Attends Religious Services: More than 4 times per year    Active Member of [employer] or Organizations: Yes    Attends Engineer, structural: More than 4 times per year    Marital Status: Married    Tobacco Counseling Counseling given: Not Answered   Clinical Intake:  Pre-visit preparation completed: Yes  Pain : No/denies pain  Diabetes: No  How often do you need to have someone help you when you read instructions, pamphlets, or other written materials from your doctor or pharmacy?: 1 - Never  Interpreter Needed?: No  Information entered by :: Kandis Fantasia LPN   Activities of Daily Living    12/30/2023    4:09 PM  In your present state of health, do you have any difficulty performing the following activities:  Hearing? 0  Vision? 0  Difficulty concentrating or making decisions? 0  Walking or climbing stairs? 0  Dressing or bathing? 0  Doing errands, shopping? 0  Preparing Food and eating ? N  Using the Toilet? N  In the past six months, have you accidently leaked urine? N  Do you have problems with loss of bowel control? N  Managing your Medications? N  Managing your Finances? N  Housekeeping or managing your Housekeeping? N    Patient Care Team: Raliegh Ip, DO as PCP - General (Family Medicine) Rollene Rotunda, MD as PCP - Cardiology (Cardiology) Cherlyn Roberts, MD as Consulting Physician (Dermatology) Donnetta Hail, MD as Consulting Physician (Rheumatology) Callie Fielding, MD as Consulting Physician (Physical Medicine and Rehabilitation) Danella Maiers, [facility] as Pharmacist (Family Medicine) Chilton Greathouse, MD as Consulting Physician (Pulmonary Disease) Rollene Rotunda, MD as Consulting Physician  (Cardiology)  Indicate any recent Medical Services you may have received from other than  [facility] providers in the past year (date may be approximate).     Assessment:   This is a routine wellness examination for Doyle.  Hearing/Vision screen Hearing Screening - Comments:: Denies hearing difficulties   Vision Screening - Comments:: Wears rx glasses - up to date with routine eye exams with Dr. Conley Rolls    Goals Addressed             This Visit's Progress    COMPLETED: Patient Stated       11/01/2020 AWV Goal: Fall Prevention  Over the next year, patient will decrease their risk for falls by: Using assistive devices, such as a cane or walker, as needed Identifying fall risks within their home and correcting them by: Removing throw rugs Adding handrails to stairs or ramps Removing clutter and keeping a clear pathway throughout the home Increasing light, especially at night Adding shower handles/bars Raising toilet seat Identifying potential personal risk factors for falls: Medication side effects Incontinence/urgency Vestibular dysfunction Hearing loss Musculoskeletal disorders Neurological disorders Orthostatic hypotension       Remain active and independent        Depression Screen    12/30/2023    4:08 PM 12/10/2023   11:18 AM 11/17/2023   10:29 AM 11/07/2023    3:20 PM 07/14/2023    2:09 PM 11/05/2022   11:22 AM 11/05/2022   11:21 AM  PHQ 2/9 Scores  PHQ - 2 Score 0 0 0 0 0 0 0  PHQ- 9 Score 2 2 0  0      Fall Risk    12/30/2023    4:09 PM 12/10/2023   11:17 AM 11/17/2023   10:29 AM 11/07/2023    3:20 PM 04/18/2023   10:20 AM  Fall Risk   Falls in the past year? 0  0  0  Number falls in past yr: 0 0 0 0 0  Injury with Fall? 0 0 0  0  Risk for fall due to : No Fall Risks No Fall Risks No Fall Risks  No Fall Risks  Follow up Falls prevention discussed;Education provided;Falls evaluation completed Falls evaluation completed Falls evaluation completed Falls  evaluation completed Education provided    MEDICARE RISK AT HOME: Medicare Risk at Home Any stairs in or around the home?: No If so, are there any without handrails?: No Home free of loose throw rugs in walkways, pet beds, electrical cords, etc?: Yes Adequate lighting in your home to reduce risk of falls?: Yes Life alert?: No Use of a cane, walker or w/c?: No Grab bars in the bathroom?: Yes Shower chair or bench in shower?: No Elevated toilet seat or a handicapped toilet?: Yes  TIMED UP AND GO:  Was the test performed?  No    Cognitive Function:    10/28/2018    1:17 PM 05/07/2016    8:22 AM 04/10/2015    3:41 PM  MMSE - Mini Mental State Exam  Orientation to time 5 4 5   Orientation to Place 5 5 5   Registration 3 3 3   Attention/ Calculation 5 4 5   Recall 3 3 3   Language- name 2 objects 2 2 2   Language- repeat 1 1 1   Language- follow 3 step command 3 1 3   Language- read & follow direction 1 1 1   Write a sentence 1 1 1   Copy design 1  1  Total score 30  30        12/30/2023    4:10 PM 11/05/2022  11:24 AM 11/05/2022   11:23 AM 11/02/2021   11:31 AM 11/01/2020   10:37 AM  6CIT Screen  What Year? 0 points 0 points 0 points 0 points 0 points  What month? 0 points 0 points 0 points 0 points 0 points  What time? 0 points 0 points 0 points 0 points 0 points  Count back from 20 0 points 0 points 0 points 0 points 0 points  Months in reverse 0 points 0 points 0 points 0 points 0 points  Repeat phrase 0 points 0 points 0 points 0 points 0 points  Total Score 0 points 0 points 0 points 0 points 0 points    Immunizations Immunization History  Administered Date(s) Administered   Fluad Quad(high Dose 65+) 07/20/2019, 08/23/2020, 08/13/2021, 09/11/2022, 09/24/2023   Influenza, High Dose Seasonal PF 09/20/2016, 09/19/2017, 08/25/2018   Influenza,inj,Quad PF,6+ Mos 08/23/2013, 09/05/2014, 08/28/2015   Moderna SARS-COV2 Booster Vaccination 09/08/2020   PFIZER(Purple  Top)SARS-COV-2 Vaccination 12/24/2019, 01/19/2020   Pfizer Covid-19 Vaccine Bivalent Booster 72yrs & up 10/01/2023   Pfizer Covid-19 Vaccine Bivalent Booster 5y-11y 08/20/2021   Pneumococcal Conjugate-13 09/05/2014   Pneumococcal Polysaccharide-23 10/28/2018   Respiratory Syncytial Virus Vaccine,Recomb Aduvanted(Arexvy) 09/16/2022   Td 09/02/2011   Tdap 09/02/2011, 04/18/2023   Zoster Recombinant(Shingrix) 11/13/2018, 03/15/2019    TDAP status: Up to date  Flu Vaccine status: Up to date  Pneumococcal vaccine status: Up to date  Covid-19 vaccine status: Information provided on how to obtain vaccines.   Qualifies for Shingles Vaccine? Yes   Zostavax completed No   Shingrix Completed?: No.    Education has been provided regarding the importance of this vaccine. Patient has been advised to call insurance company to determine out of pocket expense if they have not yet received this vaccine. Advised may also receive vaccine at local pharmacy or Health Dept. Verbalized acceptance and understanding.  Screening Tests Health Maintenance  Topic Date Due   COVID-19 Vaccine (5 - 2024-25 season) 11/26/2023   Medicare Annual Wellness (AWV)  12/29/2024   DTaP/Tdap/Td (4 - Td or Tdap) 04/17/2033   Pneumonia Vaccine 51+ Years old  Completed   INFLUENZA VACCINE  Completed   DEXA SCAN  Completed   Zoster Vaccines- Shingrix  Completed   HPV VACCINES  Aged Out    Health Maintenance  Health Maintenance Due  Topic Date Due   COVID-19 Vaccine (5 - 2024-25 season) 11/26/2023    Colorectal cancer screening: No longer required.   Mammogram status: No longer required due to age and preference.  Bone Density status: Completed 01/30/21. Results reflect: Bone density results: OSTEOPOROSIS. Repeat every 2 years.  Lung Cancer Screening: (Low Dose CT Chest recommended if Age 13-80 years, 20 pack-year currently smoking OR have quit w/in 15years.) does not qualify.   Lung Cancer Screening Referral:  n/a  Additional Screening:  Hepatitis C Screening: does not qualify  Vision Screening: Recommended annual ophthalmology exams for early detection of glaucoma and other disorders of the eye. Is the patient up to date with their annual eye exam?  Yes  Who is the provider or what is the name of the office in which the patient attends annual eye exams? Dr. Conley Rolls  If pt is not established with a provider, would they like to be referred to a provider to establish care? No .   Dental Screening: Recommended annual dental exams for proper oral hygiene  Community Resource Referral / Chronic Care Management: CRR required this visit?  No   CCM required  this visit?  No     Plan:     I have personally reviewed and noted the following in the patient's chart:   Medical and social history Use of alcohol, tobacco or illicit drugs  Current medications and supplements including opioid prescriptions. Patient is not currently taking opioid prescriptions. Functional ability and status Nutritional status Physical activity Advanced directives List of other physicians Hospitalizations, surgeries, and ER visits in previous 12 months Vitals Screenings to include cognitive, depression, and falls Referrals and appointments  In addition, I have reviewed and discussed with patient certain preventive protocols, quality metrics, and best practice recommendations. A written personalized care plan for preventive services as well as general preventive health recommendations were provided to patient.     Kandis Fantasia [place], [place]   1/61/0960   After Visit Summary: (MyChart) Due to this being a telephonic visit, the after visit summary with patients personalized plan was offered to patient via MyChart   Nurse Notes: See telephone note with patient concern/question

## 2023-12-30 NOTE — Telephone Encounter (Signed)
Really seeing her is up to her.  Her platelets are essentially stable from previous checkups.  Not sure if there will be any interventions for her but I will CC Dr. Buford Dresser to see if she has any additional advice.  I will say that her renal function dropped quite a bit since her last check and that is something of concern.  Make sure she is hydrating.  Really at this level she may need to see nephrology but I think is reasonable to recheck a BMP in 1 to 2 weeks.  Please place order and schedule lab visit

## 2023-12-30 NOTE — Patient Instructions (Signed)
Ms. Hannon , Thank you for taking time to come for your Medicare Wellness Visit. I appreciate your ongoing commitment to your health goals. Please review the following plan we discussed and let me know if I can assist you in the future.   Referrals/Orders/Follow-Ups/Clinician Recommendations: Aim for 30 minutes of exercise or brisk walking, 6-8 glasses of water, and 5 servings of fruits and vegetables each day.  This is a list of the screening recommended for you and due dates:  Health Maintenance  Topic Date Due   COVID-19 Vaccine (5 - 2024-25 season) 11/26/2023   Medicare Annual Wellness Visit  12/29/2024   DTaP/Tdap/Td vaccine (4 - Td or Tdap) 04/17/2033   Pneumonia Vaccine  Completed   Flu Shot  Completed   DEXA scan (bone density measurement)  Completed   Zoster (Shingles) Vaccine  Completed   HPV Vaccine  Aged Out    Advanced directives: (ACP Link)Information on Advanced Care Planning can be found at [facility] Advance Health Care Directives Advance Health Care Directives ([email or web address]   Next Medicare Annual Wellness Visit scheduled for next year: Yes

## 2023-12-31 NOTE — Telephone Encounter (Signed)
Pt aware of recommendations, BMP ordered, lab appt scheduled.

## 2024-01-02 DIAGNOSIS — Z85828 Personal history of other malignant neoplasm of skin: Secondary | ICD-10-CM | POA: Diagnosis not present

## 2024-01-02 DIAGNOSIS — L570 Actinic keratosis: Secondary | ICD-10-CM | POA: Diagnosis not present

## 2024-01-02 DIAGNOSIS — L538 Other specified erythematous conditions: Secondary | ICD-10-CM | POA: Diagnosis not present

## 2024-01-02 DIAGNOSIS — Z08 Encounter for follow-up examination after completed treatment for malignant neoplasm: Secondary | ICD-10-CM | POA: Diagnosis not present

## 2024-01-02 DIAGNOSIS — D225 Melanocytic nevi of trunk: Secondary | ICD-10-CM | POA: Diagnosis not present

## 2024-01-02 DIAGNOSIS — L821 Other seborrheic keratosis: Secondary | ICD-10-CM | POA: Diagnosis not present

## 2024-01-02 DIAGNOSIS — L814 Other melanin hyperpigmentation: Secondary | ICD-10-CM | POA: Diagnosis not present

## 2024-01-02 DIAGNOSIS — L820 Inflamed seborrheic keratosis: Secondary | ICD-10-CM | POA: Diagnosis not present

## 2024-01-05 ENCOUNTER — Other Ambulatory Visit: Payer: Self-pay | Admitting: Cardiology

## 2024-01-13 ENCOUNTER — Encounter: Payer: Self-pay | Admitting: Family Medicine

## 2024-01-13 ENCOUNTER — Other Ambulatory Visit: Payer: Medicare Other

## 2024-01-13 ENCOUNTER — Telehealth (INDEPENDENT_AMBULATORY_CARE_PROVIDER_SITE_OTHER): Payer: Medicare Other | Admitting: Family Medicine

## 2024-01-13 DIAGNOSIS — N390 Urinary tract infection, site not specified: Secondary | ICD-10-CM

## 2024-01-13 DIAGNOSIS — N1831 Chronic kidney disease, stage 3a: Secondary | ICD-10-CM | POA: Diagnosis not present

## 2024-01-13 LAB — URINALYSIS, ROUTINE W REFLEX MICROSCOPIC
Bilirubin, UA: NEGATIVE
Glucose, UA: NEGATIVE
Ketones, UA: NEGATIVE
Nitrite, UA: POSITIVE — AB
Protein,UA: NEGATIVE
Specific Gravity, UA: 1.01 (ref 1.005–1.030)
Urobilinogen, Ur: 0.2 mg/dL (ref 0.2–1.0)
pH, UA: 6 (ref 5.0–7.5)

## 2024-01-13 LAB — MICROSCOPIC EXAMINATION
RBC, Urine: NONE SEEN /[HPF] (ref 0–2)
Renal Epithel, UA: NONE SEEN /[HPF]
Yeast, UA: NONE SEEN

## 2024-01-13 MED ORDER — FLUCONAZOLE 150 MG PO TABS: 150.0000 mg | ORAL_TABLET | Freq: Once | ORAL | 0 refills | Status: AC

## 2024-01-13 MED ORDER — CEPHALEXIN 500 MG PO CAPS: 500.0000 mg | ORAL_CAPSULE | Freq: Two times a day (BID) | ORAL | 0 refills | Status: AC

## 2024-01-13 NOTE — Progress Notes (Signed)
 MyChart Video visit  Subjective: CC:?UTI PCP: Raliegh Ip, DO [id number] Susan Fitzgerald is a 89 y.o. female. Patient provides verbal consent for consult held via video.  Due to COVID-19 pandemic this visit was conducted virtually. This visit type was conducted due to national recommendations for restrictions regarding the COVID-19 Pandemic (e.g. social distancing, sheltering in place) in an effort to limit this patient's exposure and mitigate transmission in our community. All issues noted in this document were discussed and addressed.  A physical exam was not performed with this format.   Location of patient: home Location of provider: [facility] Others present for call: none  1. UTI She reports for the last few weeks she has been having some urgency, frequency.  No hematuria. She reports minimal dysuria.  She was seen last month for similar.  She was found to have a yeast but no significant bacterial growth on urine culture.  She was treated with Septra and Diflucan.  She has had a couple of UTI since August of last year.  Used to see a urologist but no longer sees 1.  Willing to reestablish care for further investigation.   ROS: Per HPI  Allergies  Allergen Reactions   Benazepril Hcl Cough   Past Medical History:  Diagnosis Date   Calcification of bronchial airway 06/23/2018   01/2017 - See on CT imaging of the chest    Cataract    Cerebral vascular disease 06/19/2018   06/19/2018 - MRI with chronic microvascular ischemic change   Diverticulosis    Hypertension    Osteoporosis    PAF (paroxysmal atrial fibrillation) (HCC)    a. s/p prior DCCV; b. On flecainide & coumadin (CHA2DS2VASc = 4);  c. 03/2016 Echo: EF 55-60%, mod LVH, mod AI, mild to mod TR, PASP .   Pelvic fracture (HCC)    Pulmonary fibrosis (HCC) 2018   Rheumatoid arthritis (HCC) 2017    Current Outpatient Medications:    apixaban (ELIQUIS) 2.5 MG TABS tablet, Take 2.5 mg by mouth 2 (two) times daily., Disp: ,  Rfl:    azaTHIOprine (IMURAN) 50 MG tablet, Take 50 mg by mouth daily. , Disp: , Rfl:    Calcium Carbonate-Vitamin D 600-10 MG-MCG TABS, Take 1 tablet by mouth 2 (two) times daily., Disp: , Rfl:    diltiazem (CARDIZEM CD) 300 MG 24 hr capsule, Take 1 capsule (300 mg total) by mouth daily., Disp: 100 capsule, Rfl: 3   ESBRIET 267 MG TABS, Take 3 tablets (801 mg total) by mouth with breakfast, with lunch, and with evening meal., Disp: 810 tablet, Rfl: 1   furosemide (LASIX) 20 MG tablet, Take 1 tablet (20 mg total) by mouth daily., Disp: 200 tablet, Rfl: 3   gabapentin (NEURONTIN) 100 MG capsule, Take 100 mg by mouth at bedtime. , Disp: , Rfl:    ketoconazole (NIZORAL) 2 % shampoo, Apply 1 Application topically daily as needed for irritation., Disp: , Rfl:    levothyroxine (SYNTHROID) 75 MCG tablet, Take 1 tablet (75 mcg total) by mouth daily before breakfast., Disp: 100 tablet, Rfl: 3   lisinopril (ZESTRIL) 20 MG tablet, TAKE 1 TABLET BY MOUTH DAILY, Disp: 90 tablet, Rfl: 3   lisinopril-hydrochlorothiazide (ZESTORETIC) 20-12.5 MG tablet, TAKE 1 TABLET BY MOUTH DAILY IN  THE MORNING, Disp: 90 tablet, Rfl: 3   Melatonin 5 MG TABS, Take 5 mg by mouth at bedtime., Disp: , Rfl:    Multiple Vitamin (MULTIVITAMIN) capsule, Take 1 capsule by mouth daily., Disp: , Rfl:  ondansetron (ZOFRAN-ODT) 4 MG disintegrating tablet, Take 1 tablet (4 mg total) by mouth every 8 (eight) hours as needed for nausea or vomiting., Disp: 20 tablet, Rfl: 0   potassium chloride SA (KLOR-CON M) 20 MEQ tablet, TAKE 1 TABLET BY MOUTH DAILY, Disp: 100 tablet, Rfl: 0   Probiotic Product (PROBIOTIC-10 ULTIMATE PO), Take 1 tablet by mouth daily., Disp: , Rfl:    sulfamethoxazole-trimethoprim (BACTRIM DS) 800-160 MG tablet, Take 1 tablet by mouth 2 (two) times daily., Disp: 6 tablet, Rfl: 0  Gen: Nontoxic female no acute distress  Assessment/ Plan: 89 y.o. female   Recurrent UTI - Plan: Ambulatory referral to Urology, cephALEXin  (KEFLEX) 500 MG capsule, fluconazole (DIFLUCAN) 150 MG tablet  I reviewed her urinalysis results.  Given nitrite positive, white blood cells many bacteria noted on microscopy I am going to empirically treat her for suspected E. coli UTI.  I reviewed her last couple of urine cultures which have demonstrated intermittent true infections with the last not showing an infection at all but did have a very large red blood cell burden of uncertain etiology.  I question if perhaps some of these intermittent back issues that she is been experiencing in fact not musculoskeletal but related to possible renal etiology.  Perhaps she has been having some unrecognized renal stones or bladder stones that are causing these recurrent infections.  For this reason I placed a referral to urology to further investigate this.  We will let her know what this urine culture shows once available and contact her if any medication changes needed.  Push oral fluids.  Follow-up as needed  Start time: 3:11p End time: 3:27pm  Total time spent on patient care (including video visit/ documentation): 20 minutes  [name], DO [facility] [phone]

## 2024-01-14 ENCOUNTER — Encounter: Payer: Self-pay | Admitting: Family Medicine

## 2024-01-14 DIAGNOSIS — N1832 Chronic kidney disease, stage 3b: Secondary | ICD-10-CM

## 2024-01-14 LAB — BMP8+EGFR
BUN/Creatinine Ratio: 20 (ref 12–28)
BUN: 24 mg/dL (ref 8–27)
CO2: 25 mmol/L (ref 20–29)
Calcium: 9.8 mg/dL (ref 8.7–10.3)
Chloride: 102 mmol/L (ref 96–106)
Creatinine, Ser: 1.19 mg/dL — ABNORMAL HIGH (ref 0.57–1.00)
Glucose: 87 mg/dL (ref 70–99)
Potassium: 3.9 mmol/L (ref 3.5–5.2)
Sodium: 146 mmol/L — ABNORMAL HIGH (ref 134–144)
eGFR: 44 mL/min/{1.73_m2} — ABNORMAL LOW (ref >59)

## 2024-01-16 LAB — URINE CULTURE

## 2024-01-19 ENCOUNTER — Telehealth: Payer: Self-pay | Admitting: Family Medicine

## 2024-01-19 NOTE — Telephone Encounter (Unsigned)
 Copied from CRM [id number]. Topic: Clinical - Medical Advice >> Jan 19, 2024  4:39 PM [name] wrote: Reason for CRM:  Pt called and stated that she had an appt at the end of March 25th with Dr Arita Miss. He is with [facility]. She did not accept this appt that she needed to check with Dr Reece Agar to see if she needs to go another route and would advise her.   She stated that she was messaging with Dr Reece Agar if she wanted the appt with Dr [facility] nephrologist. She is not sure what she is suppose to do or which to go with.   Can someone please call Susan Fitzgerald and advise her as to what to do and what is best.

## 2024-01-20 ENCOUNTER — Other Ambulatory Visit: Payer: Self-pay | Admitting: Family Medicine

## 2024-01-20 MED ORDER — ONDANSETRON 4 MG PO TBDP: 4.0000 mg | ORAL_TABLET | Freq: Three times a day (TID) | ORAL | 0 refills | Status: DC | PRN

## 2024-01-20 NOTE — Telephone Encounter (Signed)
 Last Fill: 08/20/23  Last OV: 01/13/24 ACUTE Next OV: 05/10/24  Routing to provider for review/authorization.

## 2024-01-20 NOTE — Telephone Encounter (Signed)
 Copied from CRM [id number]. Topic: Clinical - Medication Refill >> Jan 20, 2024 11:16 AM Antwanette L wrote: Most Recent Primary Care Visit:  Provider: Raliegh Ip  Department: [facility]  Visit Type: MYCHART VIDEO VISIT  Date: 01/13/2024  Medication: ondansetron (ZOFRAN-ODT) 4 MG disintegrating tablet   Is this the correct pharmacy for this prescription? Yes  This is the patient's preferred pharmacy:  [facility]) - [place], [place] - [address] [facility] [address] [place] [address] [place] [address] Phone: [phone] Fax: [phone]     Has the prescription been filled recently? No  Is the patient out of the medication? No  Has the patient been seen for an appointment in the last year OR does the patient have an upcoming appointment? Yes  Can we respond through MyChart? No. Contact patient by phone at [phone]. Patient wants know if Dr. Nadine Counts will prescribe 30-40 tablets?   Agent: Please be advised that Rx refills may take up to 3 business days. We ask that you follow-up with your pharmacy.

## 2024-01-20 NOTE — Telephone Encounter (Signed)
 I am quite sure where the confusion lies as I did message her distinguishing that she needs to see urology for recurrent UTI and nephrology for the drop in her kidney filter ability

## 2024-01-20 NOTE — Telephone Encounter (Signed)
 Referral for Nephrologist was just placed on 01/16/2024 (48 business hours) - Referrals can take up to 2-3 weeks for scheduling depending on Office.  I have sent Referral to [facility] as Patient requested, I have also sent a MyChart Message to Patient with Office information so she may call their Office to check on Appt Scheduling Status.

## 2024-01-20 NOTE — Telephone Encounter (Signed)
 Spoke with pt and informed her she needs to see the urologist as well as nephrologist. Informed pt I do not see an update on the nephrology referral in her chart but I would send a message to referral team for an update. Informed pt to also call the urologist back to set up an appointment with them. Pt verbalized understanding, all questions answered.

## 2024-01-21 DIAGNOSIS — M0579 Rheumatoid arthritis with rheumatoid factor of multiple sites without organ or systems involvement: Secondary | ICD-10-CM | POA: Diagnosis not present

## 2024-01-21 DIAGNOSIS — D485 Neoplasm of uncertain behavior of skin: Secondary | ICD-10-CM | POA: Diagnosis not present

## 2024-01-21 DIAGNOSIS — S81802A Unspecified open wound, left lower leg, initial encounter: Secondary | ICD-10-CM | POA: Diagnosis not present

## 2024-01-21 DIAGNOSIS — L570 Actinic keratosis: Secondary | ICD-10-CM | POA: Diagnosis not present

## 2024-01-21 DIAGNOSIS — C44622 Squamous cell carcinoma of skin of right upper limb, including shoulder: Secondary | ICD-10-CM | POA: Diagnosis not present

## 2024-01-27 ENCOUNTER — Other Ambulatory Visit: Payer: Medicare Other

## 2024-02-11 DIAGNOSIS — N3020 Other chronic cystitis without hematuria: Secondary | ICD-10-CM | POA: Diagnosis not present

## 2024-02-11 DIAGNOSIS — R3915 Urgency of urination: Secondary | ICD-10-CM | POA: Diagnosis not present

## 2024-02-12 ENCOUNTER — Telehealth: Payer: Self-pay | Admitting: Cardiology

## 2024-02-12 MED ORDER — APIXABAN 2.5 MG PO TABS: 2.5000 mg | ORAL_TABLET | Freq: Two times a day (BID) | ORAL | 0 refills | Status: DC

## 2024-02-12 NOTE — Telephone Encounter (Signed)
  Patient calling the office for samples of medication:   1.  What medication and dosage are you requesting samples for?  apixaban (ELIQUIS) 2.5 MG TABS tablet   2.  Are you currently out of this medication? Not sure, pt requesting through MyChart message

## 2024-02-12 NOTE — Telephone Encounter (Signed)
 Patient identification verified by 2 forms. Marilynn Rail, RN    Called and spoke to patient  Patient states:    -Dr. Antoine Poche initially prescribed Eliquis   -PCP has been filling Eliquis 2.5mg  BID   -PCP office does not have any samples  -due to recent changes with insurance would like assistance with Samples  Informed patient message sent to pharmacy for sample requests  Patient verbalized understanding, no questions at this time

## 2024-02-12 NOTE — Telephone Encounter (Signed)
 Susan Fitzgerald, [facility]  You1 hour ago (10:24 AM)    Eliquis 2.5 mg twice daily dose is appropriate. Ok to give 2 weeks samples.  Patient identification verified by 2 forms. Marilynn Rail, RN    Called and spoke to patient  Informed patent samples left at front desk  Patient verbalized understanding, no questions at this time

## 2024-02-17 ENCOUNTER — Other Ambulatory Visit: Payer: Self-pay

## 2024-02-17 DIAGNOSIS — D696 Thrombocytopenia, unspecified: Secondary | ICD-10-CM

## 2024-02-17 NOTE — Progress Notes (Unsigned)
 [facility] [address], [place] [address]   CLINIC:  Medical Oncology/Hematology  PCP:  Raliegh Ip, DO [address] [place] [address] [phone]   REASON FOR VISIT:  Follow-up for thrombocytopenia  PRIOR THERAPY: None  CURRENT THERAPY: Surveillance  INTERVAL HISTORY:   [name] 89 y.o. female returns for routine follow-up of thrombocytopenia.  She was last seen by Rojelio Brenner PA-C on 02/17/2023.  At today's visit, she reports feeling fair.  In the past year, she has had a few areas of squamous cell carcinoma removed from her skin, and continues to follow closely with dermatology.  She continues to see rheumatologist twice yearly (rheumatoid arthritis) and pulmonologist (pulmonary fibrosis).  No recent hospitalizations, surgeries, or changes in baseline health status.  She continues to take Eliquis for her atrial fibrillation.  She admits to easy bruising but denies petechial rash.  She reports that she bleeds easily when she cuts her self, but denies any abnormal bleeding such as epistaxis, hematemesis, hematochezia, melena, or hematuria.  She denies any B symptoms such as fever, chills, night sweats, unintentional weight loss.  She denies any new masses or lymphadenopathy.  She has 50% energy and 50% appetite. She endorses that she is maintaining a stable weight.  She remains active and golfs several times per week.  ASSESSMENT & PLAN:  1.   Mild thrombocytopenia - Per review of labs, she had intermittent mild thrombocytopenia for the last since July 2021, and a self-limited episode of mild thrombocytopenia in March 2018 during hospitalization - Most recent abdominal imaging via abdominal ultrasound (09/24/2016): Spleen size and appearance within normal limits - Hematology work-up (04/11/2021):  Peripheral smear confirms thrombocytopenia H pylori and hepatitis negative No nutritional deficiencies (unremarkable folate, B12,  methylmalonic acid, copper) - Most recent labs (02/18/24): Platelets normal at 189, normal LDH.  Baseline CMP (Normal B12, folate, methylmalonic acid when last checked in March 2023) - She admits to easy bruising but denies any bleeding or petechial rash. - Differential diagnosis includes immune-mediated thrombocytopenia (positive history of rheumatoid arthritis), drug-related thrombocytopenia (azathioprine), and early MDS - No treatment indicated at this time.  Treatment for thrombocytopenia will be considered if platelets were less than 30, or in the event of significant bleeding. - PLAN: Since patient's minimal thrombocytopenia has been stable over the past three years (with frequently normal platelets), we will tentatively discharge her to PCP.  Recommend that she has CBC checked by primary care every 6 to 12 months, and that she be referred back to [place] in the future if she has platelets <100 or develops other cytopenias.   2. Other history - PMH:  Chronic atrial fibrillation (Eliquis), pulmonary fibrosis (pirfenidone), rheumatoid arthritis (azathioprine),  and other comorbidities noted elsewhere in medical record -[name] lives at home with her husband and is functionally independent.  She enjoys an active retirement and plays golf several times per week.  She is retired from work as Futures trader. - Lifelong nonsmoker; no alcohol or illicit drugs.  No chemical or pesticide exposure. - She denies any family history of blood disorders. Positive for heart disease and stroke.  The patient's daughter is deceased from breast cancer at age 64.  Patient's maternal aunt also had breast cancer.  Her brother had colon cancer.  PLAN SUMMARY:  >> Tentative discharge from clinic     REVIEW OF SYSTEMS:   Review of Systems  Constitutional:  Positive for appetite change (chronically low appetite, 50%) and fatigue (  chronic, energy 50%). Negative for chills, diaphoresis, fever and unexpected weight change.   HENT:   Negative for lump/mass and nosebleeds.   Eyes:  Negative for eye problems.  Respiratory:  Positive for shortness of breath (chronic due to pulmonary fibrosis). Negative for cough and hemoptysis.   Cardiovascular:  Positive for palpitations (chronic atrial fibrillation). Negative for chest pain and leg swelling.  Gastrointestinal:  Negative for abdominal pain, blood in stool, constipation, diarrhea, nausea and vomiting.  Genitourinary:  Negative for hematuria.        Frequent UTIs and yeast infections  Musculoskeletal:  Positive for arthralgias (Rheumatoid arthritis).  Skin: Negative.   Neurological:  Positive for numbness. Negative for dizziness, headaches and light-headedness.  Hematological:  Does not bruise/bleed easily.  Psychiatric/Behavioral:  Positive for sleep disturbance.      PHYSICAL EXAM:  ECOG PERFORMANCE STATUS: 1 - Symptomatic but completely ambulatory  Vitals:   02/18/24 1001  BP: (!) 157/88  Pulse: 84  Resp: 16  Temp: 97.7 F (36.5 C)  SpO2: 98%   Filed Weights   02/18/24 1001  Weight: 118 lb 6.2 oz (53.7 kg)   Physical Exam Constitutional:      Appearance: Normal appearance. She is normal weight.  Cardiovascular:     Rate and Rhythm: Rhythm irregular.     Heart sounds: Normal heart sounds.  Pulmonary:     Breath sounds: Normal breath sounds.  Skin:    Findings: Bruising present.  Neurological:     General: No focal deficit present.     Mental Status: Mental status is at baseline.  Psychiatric:        Behavior: Behavior normal. Behavior is cooperative.    PAST MEDICAL/SURGICAL HISTORY:  Past Medical History:  Diagnosis Date   Calcification of bronchial airway 06/23/2018   01/2017 - See on CT imaging of the chest    Cataract    Cerebral vascular disease 06/19/2018   06/19/2018 - MRI with chronic microvascular ischemic change   Diverticulosis    Hypertension    Osteoporosis    PAF (paroxysmal atrial fibrillation) (HCC)    a. s/p prior  DCCV; b. On flecainide & coumadin (CHA2DS2VASc = 4);  c. 03/2016 Echo: EF 55-60%, mod LVH, mod AI, mild to mod TR, PASP .   Pelvic fracture (HCC)    Pulmonary fibrosis (HCC) 2018   Rheumatoid arthritis (HCC) 2017   Past Surgical History:  Procedure Laterality Date   BIOPSY BREAST     BIOPSY BREAST     right & benign   BREAST SURGERY     CARDIOVERSION N/A 10/30/2016   Procedure: CARDIOVERSION;  Surgeon: Laurey Morale, MD;  [facility] ENDOSCOPY;  Service: Cardiovascular;  Laterality: N/A;   CARDIOVERSION N/A 08/20/2017   Procedure: CARDIOVERSION;  Surgeon: Jake Bathe, MD;  Location: [facility] ENDOSCOPY;  Service: Cardiovascular;  Laterality: N/A;   CARDIOVERSION N/A 02/02/2020   Procedure: CARDIOVERSION;  Surgeon: Sande Rives, MD;  Location: [facility] ENDOSCOPY;  Service: Cardiovascular;  Laterality: N/A;   CATARACT EXTRACTION     COLONOSCOPY     EYE SURGERY     cataracts   INGUINAL HERNIA REPAIR Left 07/18/2022   Procedure: LAPAROSCOPIC LEFT INGUINAL HERNIA REPAIR WITH MESH POSSIBLE OPEN;  Surgeon: Axel Filler, MD;  Location: [facility] OR;  Service: General;  Laterality: Left;   TEE WITHOUT CARDIOVERSION N/A 10/30/2016   Procedure: TRANSESOPHAGEAL ECHOCARDIOGRAM (TEE);  Surgeon: Laurey Morale, MD;  Location: [facility] ENDOSCOPY;  Service: Cardiovascular;  Laterality: N/A;  TOTAL HIP ARTHROPLASTY Right 01/27/2017   Procedure: TOTAL HIP ARTHROPLASTY ANTERIOR APPROACH;  Surgeon: Samson Frederic, MD;  Location: [facility] OR;  Service: Orthopedics;  Laterality: Right;    SOCIAL HISTORY:  Social History   Socioeconomic History   Marital status: Married    Spouse name: Bill    Number of children: 2   Years of education: 12   Highest education level: 12th grade  Occupational History   Occupation: RETIRED  Tobacco Use   Smoking status: Never   Smokeless tobacco: Never  Vaping Use   Vaping status: Never Used  Substance and Sexual Activity   Alcohol use: No   Drug use: No   Sexual  activity: Yes    Birth control/protection: Post-menopausal  Other Topics Concern   Not on file  Social History Narrative   Lives with her husband.   Enjoys playing golf.   Still very active and independent   Social Drivers of Corporate investment banker Strain: Low Risk  (12/30/2023)   Overall Financial Resource Strain (CARDIA)    Difficulty of Paying Living Expenses: Not hard at all  Food Insecurity: No Food Insecurity (12/30/2023)   Hunger Vital Sign    Worried About Running Out of Food in the Last Year: Never true    Ran Out of Food in the Last Year: Never true  Transportation Needs: No Transportation Needs (12/30/2023)   PRAPARE - Administrator, Civil Service (Medical): No    Lack of Transportation (Non-Medical): No  Physical Activity: Insufficiently Active (12/30/2023)   Exercise Vital Sign    Days of Exercise per Week: 3 days    Minutes of Exercise per Session: 30 min  Stress: No Stress Concern Present (12/30/2023)   [employer] of Occupational Health - Occupational Stress Questionnaire    Feeling of Stress : Only a little  Social Connections: Socially Integrated (12/30/2023)   Social Connection and Isolation Panel [NHANES]    Frequency of Communication with Friends and Family: More than three times a week    Frequency of Social Gatherings with Friends and Family: Twice a week    Attends Religious Services: More than 4 times per year    Active Member of [employer] or Organizations: Yes    Attends Engineer, structural: More than 4 times per year    Marital Status: Married  Catering manager Violence: Not At Risk (12/30/2023)   Humiliation, Afraid, Rape, and Kick questionnaire    Fear of Current or Ex-Partner: No    Emotionally Abused: No    Physically Abused: No    Sexually Abused: No    FAMILY HISTORY:  Family History  Problem Relation Age of Onset   Stroke Mother        cerebral hemorrhage   Heart disease Father        MI   Heart attack Father     Vision loss Father    Heart disease Brother    Heart attack Brother    Cancer Maternal Aunt        breast   Cancer Brother    Heart disease Brother    Cancer Daughter        breast   Heart disease Son    Colon cancer Neg Hx    Colon polyps Neg Hx    Rectal cancer Neg Hx    Stomach cancer Neg Hx     CURRENT MEDICATIONS:  Outpatient Encounter Medications as of 02/18/2024  Medication Sig  estradiol (ESTRACE) 0.1 MG/GM vaginal cream Place 1 Applicatorful vaginally at bedtime.   penicillin v potassium (VEETID) 500 MG tablet Take 500 mg by mouth 4 (four) times daily.   apixaban (ELIQUIS) 2.5 MG TABS tablet Take 1 tablet (2.5 mg total) by mouth 2 (two) times daily.   azaTHIOprine (IMURAN) 50 MG tablet Take 50 mg by mouth daily.    Calcium Carbonate-Vitamin D 600-10 MG-MCG TABS Take 1 tablet by mouth 2 (two) times daily.   diltiazem (CARDIZEM CD) 300 MG 24 hr capsule Take 1 capsule (300 mg total) by mouth daily.   ESBRIET 267 MG TABS Take 3 tablets (801 mg total) by mouth with breakfast, with lunch, and with evening meal.   furosemide (LASIX) 20 MG tablet Take 1 tablet (20 mg total) by mouth daily.   gabapentin (NEURONTIN) 100 MG capsule Take 100 mg by mouth at bedtime.    ketoconazole (NIZORAL) 2 % shampoo Apply 1 Application topically daily as needed for irritation.   levothyroxine (SYNTHROID) 75 MCG tablet Take 1 tablet (75 mcg total) by mouth daily before breakfast.   lisinopril (ZESTRIL) 20 MG tablet TAKE 1 TABLET BY MOUTH DAILY   lisinopril-hydrochlorothiazide (ZESTORETIC) 20-12.5 MG tablet TAKE 1 TABLET BY MOUTH DAILY IN  THE MORNING   Melatonin 5 MG TABS Take 5 mg by mouth at bedtime.   Multiple Vitamin (MULTIVITAMIN) capsule Take 1 capsule by mouth daily.   ondansetron (ZOFRAN-ODT) 4 MG disintegrating tablet Take 1 tablet (4 mg total) by mouth every 8 (eight) hours as needed for nausea or vomiting.   potassium chloride SA (KLOR-CON M) 20 MEQ tablet TAKE 1 TABLET BY MOUTH DAILY    Probiotic Product (PROBIOTIC-10 ULTIMATE PO) Take 1 tablet by mouth daily.   [DISCONTINUED] apixaban (ELIQUIS) 2.5 MG TABS tablet Take 2.5 mg by mouth 2 (two) times daily.   No facility-administered encounter medications on file as of 02/18/2024.    ALLERGIES:  Allergies  Allergen Reactions   Benazepril Hcl Cough    LABORATORY DATA:  I have reviewed the labs as listed.  CBC    Component Value Date/Time   WBC 5.5 02/18/2024 0854   RBC 3.81 (L) 02/18/2024 0854   HGB 12.8 02/18/2024 0854   HGB 12.7 12/11/2023 1007   HCT 39.6 02/18/2024 0854   HCT 37.5 12/11/2023 1007   PLT 189 02/18/2024 0854   PLT 147 (L) 12/11/2023 1007   MCV 103.9 (H) 02/18/2024 0854   MCV 99 (H) 12/11/2023 1007   MCH 33.6 02/18/2024 0854   MCHC 32.3 02/18/2024 0854   RDW 13.3 02/18/2024 0854   RDW 12.1 12/11/2023 1007   LYMPHSABS 1.5 02/18/2024 0854   LYMPHSABS 1.2 12/11/2023 1007   MONOABS 0.7 02/18/2024 0854   EOSABS 0.2 02/18/2024 0854   EOSABS 0.1 12/11/2023 1007   BASOSABS 0.1 02/18/2024 0854   BASOSABS 0.0 12/11/2023 1007      Latest Ref Rng & Units 02/18/2024    8:54 AM 01/13/2024    9:42 AM 12/11/2023   10:07 AM  CMP  Glucose 70 - 99 mg/dL 88  87  61   BUN 8 - 23 mg/dL 26  24  25    Creatinine 0.44 - 1.00 mg/dL 8.11  9.14  7.82   Sodium 135 - 145 mmol/L 140  146  142   Potassium 3.5 - 5.1 mmol/L 3.5  3.9  3.7   Chloride 98 - 111 mmol/L 100  102  102   CO2 22 - 32 mmol/L 29  25  26   Calcium 8.9 - 10.3 mg/dL 9.9  9.8  9.6   Total Protein 6.5 - 8.1 g/dL 7.0   6.2   Total Bilirubin 0.0 - 1.2 mg/dL 0.6   0.2   Alkaline Phos 38 - 126 U/L 62   78   AST 15 - 41 U/L 25   19   ALT 0 - 44 U/L 12   10     DIAGNOSTIC IMAGING:  I have independently reviewed the relevant imaging and discussed with the patient.   WRAP UP:  All questions were answered. The patient knows to call the clinic with any problems, questions or concerns.  Medical decision making: Low  Time spent on visit: I spent 15  minutes counseling the patient face to face. The total time spent in the appointment was 22 minutes and more than 50% was on counseling.  Carnella Guadalajara, PA-C  02/18/24 10:59 AM

## 2024-02-18 ENCOUNTER — Encounter: Payer: Self-pay | Admitting: Family Medicine

## 2024-02-18 ENCOUNTER — Inpatient Hospital Stay: Payer: Medicare Other | Attending: Physician Assistant | Admitting: Physician Assistant

## 2024-02-18 ENCOUNTER — Inpatient Hospital Stay: Payer: Medicare Other

## 2024-02-18 VITALS — BP 157/88 | HR 84 | Temp 97.7°F | Resp 16 | Ht 59.45 in | Wt 118.4 lb

## 2024-02-18 DIAGNOSIS — D696 Thrombocytopenia, unspecified: Secondary | ICD-10-CM | POA: Diagnosis not present

## 2024-02-18 DIAGNOSIS — I10 Essential (primary) hypertension: Secondary | ICD-10-CM | POA: Insufficient documentation

## 2024-02-18 DIAGNOSIS — Z803 Family history of malignant neoplasm of breast: Secondary | ICD-10-CM | POA: Diagnosis not present

## 2024-02-18 DIAGNOSIS — Z7901 Long term (current) use of anticoagulants: Secondary | ICD-10-CM | POA: Diagnosis not present

## 2024-02-18 DIAGNOSIS — M069 Rheumatoid arthritis, unspecified: Secondary | ICD-10-CM | POA: Insufficient documentation

## 2024-02-18 DIAGNOSIS — G479 Sleep disorder, unspecified: Secondary | ICD-10-CM | POA: Diagnosis not present

## 2024-02-18 DIAGNOSIS — M2550 Pain in unspecified joint: Secondary | ICD-10-CM | POA: Diagnosis not present

## 2024-02-18 DIAGNOSIS — I480 Paroxysmal atrial fibrillation: Secondary | ICD-10-CM | POA: Insufficient documentation

## 2024-02-18 DIAGNOSIS — Z79899 Other long term (current) drug therapy: Secondary | ICD-10-CM | POA: Diagnosis not present

## 2024-02-18 DIAGNOSIS — R200 Anesthesia of skin: Secondary | ICD-10-CM | POA: Insufficient documentation

## 2024-02-18 DIAGNOSIS — Z79890 Hormone replacement therapy (postmenopausal): Secondary | ICD-10-CM | POA: Diagnosis not present

## 2024-02-18 DIAGNOSIS — Z8249 Family history of ischemic heart disease and other diseases of the circulatory system: Secondary | ICD-10-CM | POA: Diagnosis not present

## 2024-02-18 DIAGNOSIS — Z823 Family history of stroke: Secondary | ICD-10-CM | POA: Insufficient documentation

## 2024-02-18 DIAGNOSIS — R5383 Other fatigue: Secondary | ICD-10-CM | POA: Insufficient documentation

## 2024-02-18 DIAGNOSIS — J8410 Pulmonary fibrosis, unspecified: Secondary | ICD-10-CM | POA: Diagnosis not present

## 2024-02-18 LAB — CBC WITH DIFFERENTIAL/PLATELET
Abs Immature Granulocytes: 0.01 10*3/uL (ref 0.00–0.07)
Basophils Absolute: 0.1 10*3/uL (ref 0.0–0.1)
Basophils Relative: 1 %
Eosinophils Absolute: 0.2 10*3/uL (ref 0.0–0.5)
Eosinophils Relative: 3 %
HCT: 39.6 % (ref 36.0–46.0)
Hemoglobin: 12.8 g/dL (ref 12.0–15.0)
Immature Granulocytes: 0 %
Lymphocytes Relative: 28 %
Lymphs Abs: 1.5 10*3/uL (ref 0.7–4.0)
MCH: 33.6 pg (ref 26.0–34.0)
MCHC: 32.3 g/dL (ref 30.0–36.0)
MCV: 103.9 fL — ABNORMAL HIGH (ref 80.0–100.0)
Monocytes Absolute: 0.7 10*3/uL (ref 0.1–1.0)
Monocytes Relative: 12 %
Neutro Abs: 3.1 10*3/uL (ref 1.7–7.7)
Neutrophils Relative %: 56 %
Platelets: 189 10*3/uL (ref 150–400)
RBC: 3.81 MIL/uL — ABNORMAL LOW (ref 3.87–5.11)
RDW: 13.3 % (ref 11.5–15.5)
WBC: 5.5 10*3/uL (ref 4.0–10.5)
nRBC: 0 % (ref 0.0–0.2)

## 2024-02-18 LAB — COMPREHENSIVE METABOLIC PANEL WITH GFR
ALT: 12 U/L (ref 0–44)
AST: 25 U/L (ref 15–41)
Albumin: 3.4 g/dL — ABNORMAL LOW (ref 3.5–5.0)
Alkaline Phosphatase: 62 U/L (ref 38–126)
Anion gap: 11 (ref 5–15)
BUN: 26 mg/dL — ABNORMAL HIGH (ref 8–23)
CO2: 29 mmol/L (ref 22–32)
Calcium: 9.9 mg/dL (ref 8.9–10.3)
Chloride: 100 mmol/L (ref 98–111)
Creatinine, Ser: 1.19 mg/dL — ABNORMAL HIGH (ref 0.44–1.00)
GFR, Estimated: 44 mL/min — ABNORMAL LOW (ref >60)
Glucose, Bld: 88 mg/dL (ref 70–99)
Potassium: 3.5 mmol/L (ref 3.5–5.1)
Sodium: 140 mmol/L (ref 135–145)
Total Bilirubin: 0.6 mg/dL (ref 0.0–1.2)
Total Protein: 7 g/dL (ref 6.5–8.1)

## 2024-02-18 LAB — LACTATE DEHYDROGENASE: LDH: 149 U/L (ref 98–192)

## 2024-02-18 NOTE — Patient Instructions (Signed)
 [facility] at [facility] **VISIT SUMMARY & IMPORTANT INSTRUCTIONS **   You were seen today by Rojelio Brenner PA-C for your low platelets.  The test that we checked did not show any major abnormalities that have caused your low platelets (i.e. no evidence of cancer, nutritional deficiencies, etc.).  Most likely, your mildly low platelets are related to your medications (especially azathioprine).  It is also possible that your immune system is attacking some of your platelets, which can be seen in patients with rheumatoid arthritis.  Less likely, your low platelets could be early signs of a bone marrow disorder - however, since your platelets have been stable (and frequently normal) over the past 3 years, I do not think that this is very likely.  Since your platelets are overall stable, we will discharge you from the hematology clinic.  You should continue to have your blood count checked by your primary care doctor every 6 to 12 months.  If your platelets drop to <100, or if you have other changes in your blood counts, we would like to see you again.    ** Thank you for trusting me with your healthcare!  I strive to provide all of my patients with quality care at each visit.  If you receive a survey for this visit, I would be so grateful to you for taking the time to provide feedback.  Thank you in advance!  ~ Shlonda Dolloff                   Dr. Doreatha Massed   &   Rojelio Brenner, PA-C   - - - - - - - - - - - - - - - - - -    Thank you for choosing [facility] at [facility] to provide your oncology and hematology care.  To afford each patient quality time with our provider, please arrive at least 15 minutes before your scheduled appointment time.   If you have a lab appointment with the Cancer Center please come in thru the Main Entrance and check in at the main information desk.  You need to re-schedule your appointment should you arrive 10 or  more minutes late.  We strive to give you quality time with our providers, and arriving late affects you and other patients whose appointments are after yours.  Also, if you no show three or more times for appointments you may be dismissed from the clinic at the providers discretion.     Again, thank you for choosing [facility].  Our hope is that these requests will decrease the amount of time that you wait before being seen by our physicians.       _____________________________________________________________  Should you have questions after your visit to [facility], please contact our office at [phone] and follow the prompts.  Our office hours are 8:00 a.m. and 4:30 p.m. Monday - Friday.  Please note that voicemails left after 4:00 p.m. may not be returned until the following business day.  We are closed weekends and major holidays.  You do have access to a nurse 24-7, just call the main number to the clinic [phone] and do not press any options, hold on the line and a nurse will answer the phone.    For prescription refill requests, have your pharmacy contact our office and allow 72 hours.

## 2024-02-25 ENCOUNTER — Other Ambulatory Visit: Payer: Self-pay | Admitting: Family Medicine

## 2024-02-27 DIAGNOSIS — D485 Neoplasm of uncertain behavior of skin: Secondary | ICD-10-CM | POA: Diagnosis not present

## 2024-02-27 DIAGNOSIS — L570 Actinic keratosis: Secondary | ICD-10-CM | POA: Diagnosis not present

## 2024-02-27 DIAGNOSIS — C44629 Squamous cell carcinoma of skin of left upper limb, including shoulder: Secondary | ICD-10-CM | POA: Diagnosis not present

## 2024-03-11 ENCOUNTER — Ambulatory Visit: Payer: Self-pay

## 2024-03-11 ENCOUNTER — Encounter: Payer: Self-pay | Admitting: Family Medicine

## 2024-03-11 ENCOUNTER — Ambulatory Visit: Admitting: Family Medicine

## 2024-03-11 VITALS — BP 99/63 | HR 100 | Temp 98.8°F | Ht 59.0 in | Wt 117.0 lb

## 2024-03-11 DIAGNOSIS — R509 Fever, unspecified: Secondary | ICD-10-CM

## 2024-03-11 DIAGNOSIS — J101 Influenza due to other identified influenza virus with other respiratory manifestations: Secondary | ICD-10-CM

## 2024-03-11 DIAGNOSIS — J849 Interstitial pulmonary disease, unspecified: Secondary | ICD-10-CM

## 2024-03-11 LAB — VERITOR FLU A/B WAIVED
Influenza A: POSITIVE — AB
Influenza B: NEGATIVE

## 2024-03-11 LAB — RSV AG, IMMUNOCHR, WAIVED: RSV Ag, Immunochr, Waived: NEGATIVE

## 2024-03-11 MED ORDER — BENZONATATE 100 MG PO CAPS: 100.0000 mg | ORAL_CAPSULE | Freq: Three times a day (TID) | ORAL | 0 refills | Status: DC | PRN

## 2024-03-11 MED ORDER — OSELTAMIVIR PHOSPHATE 30 MG PO CAPS: 30.0000 mg | ORAL_CAPSULE | Freq: Two times a day (BID) | ORAL | 0 refills | Status: AC

## 2024-03-11 MED ORDER — OSELTAMIVIR PHOSPHATE 75 MG PO CAPS: 75.0000 mg | ORAL_CAPSULE | Freq: Every day | ORAL | 0 refills | Status: AC

## 2024-03-11 NOTE — Progress Notes (Signed)
 Subjective:  Patient ID: Susan Fitzgerald, female    DOB: 1932-10-31, 89 y.o.   MRN: [id number]  Patient Care Team: Eliodoro Guerin, DO as PCP - General (Family Medicine) Eilleen Grates, MD as PCP - Cardiology (Cardiology) Eduardo Grade, MD as Consulting Physician (Dermatology) Alanson Alliance, MD as Consulting Physician (Rheumatology) Kearney Passer, MD as Consulting Physician (Physical Medicine and Rehabilitation) Delilah Fend, [facility] as Pharmacist (Family Medicine) Mannam, Praveen, MD as Consulting Physician (Pulmonary Disease) Eilleen Grates, MD as Consulting Physician (Cardiology)   Chief Complaint:  fever, cough, congestion   HPI: Susan Fitzgerald is a 89 y.o. female presenting on 03/11/2024 for fever, cough, congestion  HPI 1. Fever, unspecified fever cause States that symptoms started on Tuesday night. She played in a golf tournament on Tuesday and was exhausted after. States that she has rhinorrhea, coughing, sneezing. Denies ear pain. Endorses "raw" throat from coughing. States that she has a fever with at Tmax of 101. Reports that she has been taking tylenol . Endorses shortness of breath and some trouble breathing, states that she cannot tell if it is any worse than her baseline   Relevant past medical, surgical, family, and social history reviewed and updated as indicated.  Allergies and medications reviewed and updated. Data reviewed: Chart in Epic.   Past Medical History:  Diagnosis Date   Calcification of bronchial airway 06/23/2018   01/2017 - See on CT imaging of the chest    Cataract    Cerebral vascular disease 06/19/2018   06/19/2018 - MRI with chronic microvascular ischemic change   Diverticulosis    Hypertension    Osteoporosis    PAF (paroxysmal atrial fibrillation) (HCC)    a. s/p prior DCCV; b. On flecainide  & coumadin  (CHA2DS2VASc = 4);  c. 03/2016 Echo: EF 55-60%, mod LVH, mod AI, mild to mod TR, PASP .   Pelvic fracture (HCC)     Pulmonary fibrosis (HCC) 2018   Rheumatoid arthritis (HCC) 2017    Past Surgical History:  Procedure Laterality Date   BIOPSY BREAST     BIOPSY BREAST     right & benign   BREAST SURGERY     CARDIOVERSION N/A 10/30/2016   Procedure: CARDIOVERSION;  Surgeon: Darlis Eisenmenger, MD;  Location: [facility] ENDOSCOPY;  Service: Cardiovascular;  Laterality: N/A;   CARDIOVERSION N/A 08/20/2017   Procedure: CARDIOVERSION;  Surgeon: Hugh Madura, MD;  Location: [facility] ENDOSCOPY;  Service: Cardiovascular;  Laterality: N/A;   CARDIOVERSION N/A 02/02/2020   Procedure: CARDIOVERSION;  Surgeon: Harrold Lincoln, MD;  Location: [facility] ENDOSCOPY;  Service: Cardiovascular;  Laterality: N/A;   CATARACT EXTRACTION     COLONOSCOPY     EYE SURGERY     cataracts   INGUINAL HERNIA REPAIR Left 07/18/2022   Procedure: LAPAROSCOPIC LEFT INGUINAL HERNIA REPAIR WITH MESH POSSIBLE OPEN;  Surgeon: Shela Derby, MD;  Location: [facility] OR;  Service: General;  Laterality: Left;   TEE WITHOUT CARDIOVERSION N/A 10/30/2016   Procedure: TRANSESOPHAGEAL ECHOCARDIOGRAM (TEE);  Surgeon: Darlis Eisenmenger, MD;  Location: [facility] ENDOSCOPY;  Service: Cardiovascular;  Laterality: N/A;   TOTAL HIP ARTHROPLASTY Right 01/27/2017   Procedure: TOTAL HIP ARTHROPLASTY ANTERIOR APPROACH;  Surgeon: Adonica Hoose, MD;  Location: [facility] OR;  Service: Orthopedics;  Laterality: Right;   Social History   Socioeconomic History   Marital status: Married    Spouse name: Bill    Number of children: 2   Years of education: 12   Highest education level: 12th  grade  Occupational History   Occupation: RETIRED  Tobacco Use   Smoking status: Never   Smokeless tobacco: Never  Vaping Use   Vaping status: Never Used  Substance and Sexual Activity   Alcohol use: No   Drug use: No   Sexual activity: Yes    Birth control/protection: Post-menopausal  Other Topics Concern   Not on file  Social History Narrative   Lives with her husband.   Enjoys playing golf.    Still very active and independent   Social Drivers of Corporate investment banker Strain: Low Risk  (12/30/2023)   Overall Financial Resource Strain (CARDIA)    Difficulty of Paying Living Expenses: Not hard at all  Food Insecurity: No Food Insecurity (12/30/2023)   Hunger Vital Sign    Worried About Running Out of Food in the Last Year: Never true    Ran Out of Food in the Last Year: Never true  Transportation Needs: No Transportation Needs (12/30/2023)   PRAPARE - Administrator, Civil Service (Medical): No    Lack of Transportation (Non-Medical): No  Physical Activity: Insufficiently Active (12/30/2023)   Exercise Vital Sign    Days of Exercise per Week: 3 days    Minutes of Exercise per Session: 30 min  Stress: No Stress Concern Present (12/30/2023)   [employer] of Occupational Health - Occupational Stress Questionnaire    Feeling of Stress : Only a little  Social Connections: Socially Integrated (12/30/2023)   Social Connection and Isolation Panel [NHANES]    Frequency of Communication with Friends and Family: More than three times a week    Frequency of Social Gatherings with Friends and Family: Twice a week    Attends Religious Services: More than 4 times per year    Active Member of [employer] or Organizations: Yes    Attends Engineer, structural: More than 4 times per year    Marital Status: Married  Catering manager Violence: Not At Risk (12/30/2023)   Humiliation, Afraid, Rape, and Kick questionnaire    Fear of Current or Ex-Partner: No    Emotionally Abused: No    Physically Abused: No    Sexually Abused: No    Outpatient Encounter Medications as of 03/11/2024  Medication Sig   apixaban  (ELIQUIS ) 2.5 MG TABS tablet Take 1 tablet (2.5 mg total) by mouth 2 (two) times daily.   azaTHIOprine (IMURAN) 50 MG tablet Take 50 mg by mouth daily.    Calcium  Carbonate-Vitamin D  600-10 MG-MCG TABS Take 1 tablet by mouth 2 (two) times daily.   diltiazem   (CARDIZEM  CD) 300 MG 24 hr capsule Take 1 capsule (300 mg total) by mouth daily.   ESBRIET  267 MG TABS Take 3 tablets (801 mg total) by mouth with breakfast, with lunch, and with evening meal.   estradiol (ESTRACE) 0.1 MG/GM vaginal cream Place 1 Applicatorful vaginally at bedtime.   furosemide  (LASIX ) 20 MG tablet Take 1 tablet (20 mg total) by mouth daily.   gabapentin  (NEURONTIN ) 100 MG capsule Take 100 mg by mouth at bedtime.    ketoconazole (NIZORAL) 2 % shampoo Apply 1 Application topically daily as needed for irritation.   levothyroxine  (SYNTHROID ) 75 MCG tablet Take 1 tablet (75 mcg total) by mouth daily before breakfast.   lisinopril  (ZESTRIL ) 20 MG tablet TAKE 1 TABLET BY MOUTH DAILY   lisinopril -hydrochlorothiazide  (ZESTORETIC ) 20-12.5 MG tablet TAKE 1 TABLET BY MOUTH DAILY IN  THE MORNING   Melatonin 5 MG TABS Take 5  mg by mouth at bedtime.   Multiple Vitamin (MULTIVITAMIN) capsule Take 1 capsule by mouth daily.   ondansetron  (ZOFRAN -ODT) 4 MG disintegrating tablet Take 1 tablet (4 mg total) by mouth every 8 (eight) hours as needed for nausea or vomiting.   penicillin v potassium (VEETID) 500 MG tablet Take 500 mg by mouth 4 (four) times daily.   potassium chloride  SA (KLOR-CON  M) 20 MEQ tablet TAKE 1 TABLET BY MOUTH DAILY   Probiotic Product (PROBIOTIC-10 ULTIMATE PO) Take 1 tablet by mouth daily.   No facility-administered encounter medications on file as of 03/11/2024.    Allergies  Allergen Reactions   Benazepril Hcl Cough    Review of Systems As per HPI  Objective:  BP 99/63   Pulse 100   Temp 98.8 F (37.1 C)   Ht 4\' 11"  (1.499 m)   Wt 117 lb (53.1 kg)   SpO2 93%   BMI 23.63 kg/m    Wt Readings from Last 3 Encounters:  03/11/24 117 lb (53.1 kg)  02/18/24 118 lb 6.2 oz (53.7 kg)  12/30/23 118 lb (53.5 kg)    Physical Exam Constitutional:      General: She is awake. She is not in acute distress.    Appearance: Normal appearance. She is well-developed and  well-groomed. She is ill-appearing. She is not toxic-appearing or diaphoretic.  HENT:     Right Ear: There is impacted cerumen.     Left Ear:  No middle ear effusion. There is no impacted cerumen. Tympanic membrane is not perforated, erythematous or retracted.     Nose: Congestion and rhinorrhea present. Rhinorrhea is clear.     Mouth/Throat:     Lips: Pink. No lesions.     Mouth: Mucous membranes are moist. No injury.     Pharynx: Posterior oropharyngeal erythema present. No pharyngeal swelling, oropharyngeal exudate, uvula swelling or postnasal drip.     Tonsils: No tonsillar exudate or tonsillar abscesses.  Cardiovascular:     Rate and Rhythm: Normal rate. Rhythm irregularly irregular.     Heart sounds: Normal heart sounds.  Pulmonary:     Effort: Pulmonary effort is normal.     Breath sounds: Normal breath sounds.  Lymphadenopathy:     Head:     Right side of head: No submental, submandibular, tonsillar, preauricular or posterior auricular adenopathy.     Left side of head: Tonsillar adenopathy present. No submental, submandibular, preauricular or posterior auricular adenopathy.     Cervical:     Right cervical: No superficial cervical adenopathy.    Left cervical: No superficial cervical adenopathy.  Skin:    General: Skin is warm.     Capillary Refill: Capillary refill takes less than 2 seconds.  Neurological:     General: No focal deficit present.     Mental Status: She is alert, oriented to person, place, and time and easily aroused. Mental status is at baseline.  Psychiatric:        Attention and Perception: Attention and perception normal.        Mood and Affect: Mood and affect normal.        Speech: Speech normal.        Behavior: Behavior normal. Behavior is cooperative.        Thought Content: Thought content normal.        Cognition and Memory: Cognition and memory normal.        Judgment: Judgment normal.    Results for orders placed or performed in visit  on  02/18/24  Lactate dehydrogenase   Collection Time: 02/18/24  8:54 AM  Result Value Ref Range   LDH 149 98 - 192 U/L  Comprehensive metabolic panel   Collection Time: 02/18/24  8:54 AM  Result Value Ref Range   Sodium 140 135 - 145 mmol/L   Potassium 3.5 3.5 - 5.1 mmol/L   Chloride 100 98 - 111 mmol/L   CO2 29 22 - 32 mmol/L   Glucose, Bld 88 70 - 99 mg/dL   BUN 26 (H) 8 - 23 mg/dL   Creatinine, Ser 0.45 (H) 0.44 - 1.00 mg/dL   Calcium  9.9 8.9 - 10.3 mg/dL   Total Protein 7.0 6.5 - 8.1 g/dL   Albumin 3.4 (L) 3.5 - 5.0 g/dL   AST 25 15 - 41 U/L   ALT 12 0 - 44 U/L   Alkaline Phosphatase 62 38 - 126 U/L   Total Bilirubin 0.6 0.0 - 1.2 mg/dL   GFR, Estimated 44 (L) >60 mL/min   Anion gap 11 5 - 15  CBC with Differential   Collection Time: 02/18/24  8:54 AM  Result Value Ref Range   WBC 5.5 4.0 - 10.5 K/uL   RBC 3.81 (L) 3.87 - 5.11 MIL/uL   Hemoglobin 12.8 12.0 - 15.0 g/dL   HCT 40.9 81.1 - 91.4 %   MCV 103.9 (H) 80.0 - 100.0 fL   MCH 33.6 26.0 - 34.0 pg   MCHC 32.3 30.0 - 36.0 g/dL   RDW 78.2 95.6 - 21.3 %   Platelets 189 150 - 400 K/uL   nRBC 0.0 0.0 - 0.2 %   Neutrophils Relative % 56 %   Neutro Abs 3.1 1.7 - 7.7 K/uL   Lymphocytes Relative 28 %   Lymphs Abs 1.5 0.7 - 4.0 K/uL   Monocytes Relative 12 %   Monocytes Absolute 0.7 0.1 - 1.0 K/uL   Eosinophils Relative 3 %   Eosinophils Absolute 0.2 0.0 - 0.5 K/uL   Basophils Relative 1 %   Basophils Absolute 0.1 0.0 - 0.1 K/uL   Immature Granulocytes 0 %   Abs Immature Granulocytes 0.01 0.00 - 0.07 K/uL   *Note: Due to a large number of results and/or encounters for the requested time period, some results have not been displayed. A complete set of results can be found in Results Review.       03/11/2024    3:46 PM 12/30/2023    4:08 PM 12/10/2023   11:18 AM 11/17/2023   10:29 AM 11/07/2023    3:20 PM  Depression screen PHQ 2/9  Decreased Interest 0 0 0 0 0  Down, Depressed, Hopeless 0 0 0 0 0  PHQ - 2 Score 0 0  0 0 0  Altered sleeping 1 1 1  0   Tired, decreased energy 1 0 0 0   Change in appetite 1 1 1  0   Feeling bad or failure about yourself  0 0 0 0   Trouble concentrating 0 0 0 0   Moving slowly or fidgety/restless 0 0 0 0   Suicidal thoughts 0 0 0 0   PHQ-9 Score 3 2 2  0   Difficult doing work/chores Somewhat difficult Not difficult at all  Not difficult at all        03/11/2024    3:47 PM 12/10/2023   11:18 AM 11/17/2023   10:29 AM 07/14/2023    2:09 PM  GAD 7 : Generalized Anxiety Score  Nervous, Anxious, on  Edge 0 0 0 0  Control/stop worrying 0 0 0 0  Worry too much - different things 0 0 0 0  Trouble relaxing 0 0 0 0  Restless 0 0 0 0  Easily annoyed or irritable 0 0 0 0  Afraid - awful might happen 0 0 0 0  Total GAD 7 Score 0 0 0 0  Anxiety Difficulty Not difficult at all Not difficult at all Not difficult at all    Pertinent labs & imaging results that were available during my care of the patient were reviewed by me and considered in my medical decision making.  Assessment & Plan:  Marvie was seen today for fever, cough, congestion.  Diagnoses and all orders for this visit:  Influenza B Flu B positive on rapid test. Sent in tamiflu  as below, renal adjusted. However, pharmacy called and did not want to split a blister pack, so edited to Take 30 mg BID for 5 Days. Sent in tessalon  perles to assist with symptoms. Discussed with patient that viral in etiology. Encouraged patient to follow up if they have worsening symptoms or signs of double worsening. Discussed at home care such as humidifier, saline spray, throat lozenges, increasing hydration, and tylenol  for pain or fever.  -     Veritor Flu A/B Waived -     COVID-19, Flu A+B and RSV -     RSV Ag, Immunochr, Waived -     benzonatate  (TESSALON  PERLES) 100 MG capsule; Take 1 capsule (100 mg total) by mouth 3 (three) times daily as needed. -     oseltamivir  (TAMIFLU ) 75 MG capsule; Take 1 capsule (75 mg total) by mouth daily  for 1 dose. -     oseltamivir  (TAMIFLU ) 30 MG capsule; Take 1 capsule (30 mg total) by mouth 2 (two) times daily for 4 days.  Fever, unspecified fever cause As above.  -     Veritor Flu A/B Waived -     COVID-19, Flu A+B and RSV -     RSV Ag, Immunochr, Waived  Interstitial pulmonary disease (HCC) Discussed with patient to monitor shortness of breath and breathing. Stable in office. Okay for discharge. Will have patient follow up with pulmonologist.    Continue all other maintenance medications.  Follow up plan: Return if symptoms worsen or fail to improve.   Continue healthy lifestyle choices, including diet (rich in fruits, vegetables, and lean proteins, and low in salt and simple carbohydrates) and exercise (at least 30 minutes of moderate physical activity daily).  Written and verbal instructions provided   The above assessment and management plan was discussed with the patient. The patient verbalized understanding of and has agreed to the management plan. Patient is aware to call the clinic if they develop any new symptoms or if symptoms persist or worsen. Patient is aware when to return to the clinic for a follow-up visit. Patient educated on when it is appropriate to go to the emergency department.   Jacqualyn Mates, DNP-FNP [facility] [address] [place], [place] [address] [phone]

## 2024-03-11 NOTE — Telephone Encounter (Signed)
Noted, will close encounter. 

## 2024-03-11 NOTE — Telephone Encounter (Signed)
 Chief Complaint: cough  Symptoms: cough Frequency: x 2 days Pertinent Negatives: Patient denies chest pain Disposition: [] ED /[] Urgent Care (no appt availability in office) / [x] Appointment(In office/virtual)/ []  [place]/ [] Home Care/ [] Refused Recommended Disposition /[] [facility]/ []  Follow-up with PCP Additional Notes: pt states cough and fever of 100.7 this morning and last night 101. States no more SOB than her normal but runny nose and sinus headache. States taking tylenol  500 mg. States this all started on Tuesday night.   Copied from CRM [id number]. Topic: Clinical - Red Word Triage >> Mar 11, 2024  9:48 AM Marlan Silva wrote: Red Word that prompted transfer to Nurse Triage: Patient called in stating she is not feeling well, had a fever of 101 and is requesting  same day appointment or medical advice. Reason for Disposition  [1] Fever > 101 F (38.3 C) AND [2] age > 60 years  Answer Assessment - Initial Assessment Questions 1. ONSET: "When did the cough begin?"      Tuesday night 2. SEVERITY: "How bad is the cough today?"      bad 3. SPUTUM: "Describe the color of your sputum" (none, dry cough; clear, white, yellow, green)     no 4. HEMOPTYSIS: "Are you coughing up any blood?" If so ask: "How much?" (flecks, streaks, tablespoons, etc.)     no 5. DIFFICULTY BREATHING: "Are you having difficulty breathing?" If Yes, ask: "How bad is it?" (e.g., mild, moderate, severe)    - MILD: No SOB at rest, mild SOB with walking, speaks normally in sentences, can lie down, no retractions, pulse < 100.    - MODERATE: SOB at rest, SOB with minimal exertion and prefers to sit, cannot lie down flat, speaks in phrases, mild retractions, audible wheezing, pulse 100-120.    - SEVERE: Very SOB at rest, speaks in single words, struggling to breathe, sitting hunched forward, retractions, pulse > 120      Normal mount 6. FEVER: "Do you have a fever?" If Yes, ask: "What is your  temperature, how was it measured, and when did it start?"     100.7 this morning 7. CARDIAC HISTORY: "Do you have any history of heart disease?" (e.g., heart attack, congestive heart failure)      no 8. LUNG HISTORY: "Do you have any history of lung disease?"  (e.g., pulmonary embolus, asthma, emphysema)     Pulmonary fibrosis 9. PE RISK FACTORS: "Do you have a history of blood clots?" (or: recent major surgery, recent prolonged travel, bedridden)     yes 10. OTHER SYMPTOMS: "Do you have any other symptoms?" (e.g., runny nose, wheezing, chest pain)      Sinus headache, runny nose  Protocols used: Cough - Acute Non-Productive-A-AH

## 2024-03-11 NOTE — Patient Instructions (Signed)
 Take tamiflu  75 mg once.  Next day start 30 mg twice daily for 4 days

## 2024-03-12 LAB — COVID-19, FLU A+B AND RSV
Influenza A, NAA: DETECTED — AB
Influenza B, NAA: NOT DETECTED
RSV, NAA: NOT DETECTED
SARS-CoV-2, NAA: NOT DETECTED

## 2024-03-16 ENCOUNTER — Encounter: Payer: Self-pay | Admitting: Family Medicine

## 2024-03-16 NOTE — Progress Notes (Signed)
 Influenza A, complete tamiflu . Follow up if symptoms worsen or do not improve.

## 2024-03-19 ENCOUNTER — Ambulatory Visit (INDEPENDENT_AMBULATORY_CARE_PROVIDER_SITE_OTHER): Admitting: Family Medicine

## 2024-03-19 ENCOUNTER — Encounter: Payer: Self-pay | Admitting: Family Medicine

## 2024-03-19 VITALS — BP 139/77 | HR 84 | Temp 98.3°F | Ht 59.0 in | Wt 117.0 lb

## 2024-03-19 DIAGNOSIS — N390 Urinary tract infection, site not specified: Secondary | ICD-10-CM | POA: Diagnosis not present

## 2024-03-19 LAB — MICROSCOPIC EXAMINATION
Renal Epithel, UA: NONE SEEN /HPF
Yeast, UA: NONE SEEN

## 2024-03-19 LAB — URINALYSIS, ROUTINE W REFLEX MICROSCOPIC
Bilirubin, UA: NEGATIVE
Glucose, UA: NEGATIVE
Ketones, UA: NEGATIVE
Nitrite, UA: NEGATIVE
Protein,UA: NEGATIVE
Specific Gravity, UA: 1.015 (ref 1.005–1.030)
Urobilinogen, Ur: 0.2 mg/dL (ref 0.2–1.0)
pH, UA: 6 (ref 5.0–7.5)

## 2024-03-19 MED ORDER — SULFAMETHOXAZOLE-TRIMETHOPRIM 800-160 MG PO TABS: 1.0000 | ORAL_TABLET | Freq: Two times a day (BID) | ORAL | 0 refills | Status: DC

## 2024-03-19 NOTE — Progress Notes (Signed)
 Subjective:  Patient ID: Susan Fitzgerald, female    DOB: 04-29-1932, 89 y.o.   MRN: [id number]  Patient Care Team: Eliodoro Guerin, DO as PCP - General (Family Medicine) Eilleen Grates, MD as PCP - Cardiology (Cardiology) Eduardo Grade, MD as Consulting Physician (Dermatology) Alanson Alliance, MD as Consulting Physician (Rheumatology) Kearney Passer, MD as Consulting Physician (Physical Medicine and Rehabilitation) Delilah Fend, [facility] as Pharmacist (Family Medicine) Mannam, Praveen, MD as Consulting Physician (Pulmonary Disease) Eilleen Grates, MD as Consulting Physician (Cardiology)   Chief Complaint:  Urinary Tract Infection   HPI: Susan Fitzgerald is a 89 y.o. female presenting on 03/19/2024 for Urinary Tract Infection  HPI 1. Recurrent UTI States that she has urgency, burning, stinging. Endorses decreased appetite. Denies itching and odor. Denies discharge, new low back pain. Denies new fevers, she is getting over the flu.  Started a few days ago.  Denies fever, N/V.    Relevant past medical, surgical, family, and social history reviewed and updated as indicated.  Allergies and medications reviewed and updated. Data reviewed: Chart in Epic.   Past Medical History:  Diagnosis Date   Calcification of bronchial airway 06/23/2018   01/2017 - See on CT imaging of the chest    Cataract    Cerebral vascular disease 06/19/2018   06/19/2018 - MRI with chronic microvascular ischemic change   Diverticulosis    Hypertension    Osteoporosis    PAF (paroxysmal atrial fibrillation) (HCC)    a. s/p prior DCCV; b. On flecainide  & coumadin  (CHA2DS2VASc = 4);  c. 03/2016 Echo: EF 55-60%, mod LVH, mod AI, mild to mod TR, PASP .   Pelvic fracture (HCC)    Pulmonary fibrosis (HCC) 2018   Rheumatoid arthritis (HCC) 2017    Past Surgical History:  Procedure Laterality Date   BIOPSY BREAST     BIOPSY BREAST     right & benign   BREAST SURGERY     CARDIOVERSION N/A  10/30/2016   Procedure: CARDIOVERSION;  Surgeon: Darlis Eisenmenger, MD;  Location: [facility] ENDOSCOPY;  Service: Cardiovascular;  Laterality: N/A;   CARDIOVERSION N/A 08/20/2017   Procedure: CARDIOVERSION;  Surgeon: Hugh Madura, MD;  Location: [facility] ENDOSCOPY;  Service: Cardiovascular;  Laterality: N/A;   CARDIOVERSION N/A 02/02/2020   Procedure: CARDIOVERSION;  Surgeon: Harrold Lincoln, MD;  Location: [facility] ENDOSCOPY;  Service: Cardiovascular;  Laterality: N/A;   CATARACT EXTRACTION     COLONOSCOPY     EYE SURGERY     cataracts   INGUINAL HERNIA REPAIR Left 07/18/2022   Procedure: LAPAROSCOPIC LEFT INGUINAL HERNIA REPAIR WITH MESH POSSIBLE OPEN;  Surgeon: Shela Derby, MD;  Location: [facility] OR;  Service: General;  Laterality: Left;   TEE WITHOUT CARDIOVERSION N/A 10/30/2016   Procedure: TRANSESOPHAGEAL ECHOCARDIOGRAM (TEE);  Surgeon: Darlis Eisenmenger, MD;  Location: [facility] ENDOSCOPY;  Service: Cardiovascular;  Laterality: N/A;   TOTAL HIP ARTHROPLASTY Right 01/27/2017   Procedure: TOTAL HIP ARTHROPLASTY ANTERIOR APPROACH;  Surgeon: Adonica Hoose, MD;  Location: [facility] OR;  Service: Orthopedics;  Laterality: Right;    Social History   Socioeconomic History   Marital status: Married    Spouse name: Bill    Number of children: 2   Years of education: 12   Highest education level: 12th grade  Occupational History   Occupation: RETIRED  Tobacco Use   Smoking status: Never   Smokeless tobacco: Never  Vaping Use   Vaping status: Never Used  Substance and Sexual  Activity   Alcohol use: No   Drug use: No   Sexual activity: Yes    Birth control/protection: Post-menopausal  Other Topics Concern   Not on file  Social History Narrative   Lives with her husband.   Enjoys playing golf.   Still very active and independent   Social Drivers of Corporate investment banker Strain: Low Risk  (12/30/2023)   Overall Financial Resource Strain (CARDIA)    Difficulty of Paying Living Expenses: Not hard at all   Food Insecurity: No Food Insecurity (12/30/2023)   Hunger Vital Sign    Worried About Running Out of Food in the Last Year: Never true    Ran Out of Food in the Last Year: Never true  Transportation Needs: No Transportation Needs (12/30/2023)   PRAPARE - Administrator, Civil Service (Medical): No    Lack of Transportation (Non-Medical): No  Physical Activity: Insufficiently Active (12/30/2023)   Exercise Vital Sign    Days of Exercise per Week: 3 days    Minutes of Exercise per Session: 30 min  Stress: No Stress Concern Present (12/30/2023)   [employer] of Occupational Health - Occupational Stress Questionnaire    Feeling of Stress : Only a little  Social Connections: Socially Integrated (12/30/2023)   Social Connection and Isolation Panel [NHANES]    Frequency of Communication with Friends and Family: More than three times a week    Frequency of Social Gatherings with Friends and Family: Twice a week    Attends Religious Services: More than 4 times per year    Active Member of [employer] or Organizations: Yes    Attends Engineer, structural: More than 4 times per year    Marital Status: Married  Catering manager Violence: Not At Risk (12/30/2023)   Humiliation, Afraid, Rape, and Kick questionnaire    Fear of Current or Ex-Partner: No    Emotionally Abused: No    Physically Abused: No    Sexually Abused: No    Outpatient Encounter Medications as of 03/19/2024  Medication Sig   apixaban  (ELIQUIS ) 2.5 MG TABS tablet Take 1 tablet (2.5 mg total) by mouth 2 (two) times daily.   azaTHIOprine (IMURAN) 50 MG tablet Take 50 mg by mouth daily.    benzonatate  (TESSALON  PERLES) 100 MG capsule Take 1 capsule (100 mg total) by mouth 3 (three) times daily as needed.   Calcium  Carbonate-Vitamin D  600-10 MG-MCG TABS Take 1 tablet by mouth 2 (two) times daily.   diltiazem  (CARDIZEM  CD) 300 MG 24 hr capsule Take 1 capsule (300 mg total) by mouth daily.   ESBRIET  267 MG TABS Take  3 tablets (801 mg total) by mouth with breakfast, with lunch, and with evening meal.   estradiol (ESTRACE) 0.1 MG/GM vaginal cream Place 1 Applicatorful vaginally at bedtime.   furosemide  (LASIX ) 20 MG tablet Take 1 tablet (20 mg total) by mouth daily.   gabapentin  (NEURONTIN ) 100 MG capsule Take 100 mg by mouth at bedtime.    ketoconazole (NIZORAL) 2 % shampoo Apply 1 Application topically daily as needed for irritation.   levothyroxine  (SYNTHROID ) 75 MCG tablet Take 1 tablet (75 mcg total) by mouth daily before breakfast.   lisinopril  (ZESTRIL ) 20 MG tablet TAKE 1 TABLET BY MOUTH DAILY   lisinopril -hydrochlorothiazide  (ZESTORETIC ) 20-12.5 MG tablet TAKE 1 TABLET BY MOUTH DAILY IN  THE MORNING   Melatonin 5 MG TABS Take 5 mg by mouth at bedtime.   Multiple Vitamin (MULTIVITAMIN) capsule Take  1 capsule by mouth daily.   ondansetron  (ZOFRAN -ODT) 4 MG disintegrating tablet Take 1 tablet (4 mg total) by mouth every 8 (eight) hours as needed for nausea or vomiting.   penicillin v potassium (VEETID) 500 MG tablet Take 500 mg by mouth 4 (four) times daily.   potassium chloride  SA (KLOR-CON  M) 20 MEQ tablet TAKE 1 TABLET BY MOUTH DAILY   Probiotic Product (PROBIOTIC-10 ULTIMATE PO) Take 1 tablet by mouth daily.   No facility-administered encounter medications on file as of 03/19/2024.    Allergies  Allergen Reactions   Benazepril Hcl Cough    Review of Systems As per HPI  Objective:  BP 139/77   Pulse 84   Temp 98.3 F (36.8 C)   Ht 4\' 11"  (1.499 m)   Wt 117 lb (53.1 kg)   SpO2 96%   BMI 23.63 kg/m    Wt Readings from Last 3 Encounters:  03/19/24 117 lb (53.1 kg)  03/11/24 117 lb (53.1 kg)  02/18/24 118 lb 6.2 oz (53.7 kg)   Physical Exam Constitutional:      General: She is awake. She is not in acute distress.    Appearance: Normal appearance. She is well-developed and well-groomed. She is not ill-appearing, toxic-appearing or diaphoretic.  Cardiovascular:     Rate and Rhythm:  Rhythm irregularly irregular.     Heart sounds: Normal heart sounds.  Pulmonary:     Effort: Pulmonary effort is normal.     Breath sounds: Normal breath sounds.  Abdominal:     General: Abdomen is flat. Bowel sounds are normal. There is no distension.     Palpations: Abdomen is soft.     Tenderness: There is no abdominal tenderness. There is no right CVA tenderness or left CVA tenderness.     Hernia: No hernia is present.  Neurological:     General: No focal deficit present.     Mental Status: She is alert, oriented to person, place, and time and easily aroused. Mental status is at baseline.  Psychiatric:        Attention and Perception: Attention and perception normal.        Mood and Affect: Mood and affect normal.        Speech: Speech normal.        Behavior: Behavior normal. Behavior is cooperative.        Thought Content: Thought content normal.        Cognition and Memory: Cognition and memory normal.        Judgment: Judgment normal.     Results for orders placed or performed in visit on 03/11/24  RSV Ag, Immunochr, Waived   Collection Time: 03/11/24  3:43 PM   Specimen: Nasopharyngeal   Naso  Result Value Ref Range   RSV Ag, Immunochr, Waived Negative Negative  Veritor Flu A/B Waived   Collection Time: 03/11/24  3:43 PM  Result Value Ref Range   Influenza A Positive (A) Negative   Influenza B Negative Negative  COVID-19, Flu A+B and RSV   Collection Time: 03/11/24  4:09 PM   Specimen: Nasopharyngeal(NP) swabs in vial transport medium  Result Value Ref Range   SARS-CoV-2, NAA Not Detected Not Detected   Influenza A, NAA Detected (A) Not Detected   Influenza B, NAA Not Detected Not Detected   RSV, NAA Not Detected Not Detected   Test Information: Comment    *Note: Due to a large number of results and/or encounters for the requested time period, some  results have not been displayed. A complete set of results can be found in Results Review.       03/19/2024     4:03 PM 03/11/2024    3:46 PM 12/30/2023    4:08 PM 12/10/2023   11:18 AM 11/17/2023   10:29 AM  Depression screen PHQ 2/9  Decreased Interest 0 0 0 0 0  Down, Depressed, Hopeless 0 0 0 0 0  PHQ - 2 Score 0 0 0 0 0  Altered sleeping  1 1 1  0  Tired, decreased energy  1 0 0 0  Change in appetite  1 1 1  0  Feeling bad or failure about yourself   0 0 0 0  Trouble concentrating  0 0 0 0  Moving slowly or fidgety/restless  0 0 0 0  Suicidal thoughts  0 0 0 0  PHQ-9 Score  3 2 2  0  Difficult doing work/chores  Somewhat difficult Not difficult at all  Not difficult at all       03/19/2024    4:03 PM 03/11/2024    3:47 PM 12/10/2023   11:18 AM 11/17/2023   10:29 AM  GAD 7 : Generalized Anxiety Score  Nervous, Anxious, on Edge 0 0 0 0  Control/stop worrying 0 0 0 0  Worry too much - different things 0 0 0 0  Trouble relaxing 0 0 0 0  Restless 0 0 0 0  Easily annoyed or irritable 0 0 0 0  Afraid - awful might happen 0 0 0 0  Total GAD 7 Score 0 0 0 0  Anxiety Difficulty Not difficult at all Not difficult at all Not difficult at all Not difficult at all    Pertinent labs & imaging results that were available during my care of the patient were reviewed by me and considered in my medical decision making.  Assessment & Plan:  Diagnoses and all orders for this visit:  Recurrent UTI Will send in medication as below based on UA results. Will await results of culture. Discussed red flag symptoms with patient.  -     Urinalysis, Routine w reflex microscopic -     Urine Culture -     sulfamethoxazole -trimethoprim  (BACTRIM  DS) 800-160 MG tablet; Take 1 tablet by mouth 2 (two) times daily. -     Microscopic Examination  Continue all other maintenance medications.  Follow up plan: Return if symptoms worsen or fail to improve.   Continue healthy lifestyle choices, including diet (rich in fruits, vegetables, and lean proteins, and low in salt and simple carbohydrates) and exercise (at least  30 minutes of moderate physical activity daily).  Written and verbal instructions provided   The above assessment and management plan was discussed with the patient. The patient verbalized understanding of and has agreed to the management plan. Patient is aware to call the clinic if they develop any new symptoms or if symptoms persist or worsen. Patient is aware when to return to the clinic for a follow-up visit. Patient educated on when it is appropriate to go to the emergency department.   Jacqualyn Mates, DNP-FNP [facility] [address] [place], [place] [address] [phone]

## 2024-03-21 LAB — URINE CULTURE

## 2024-03-22 ENCOUNTER — Encounter: Payer: Self-pay | Admitting: Family Medicine

## 2024-03-22 NOTE — Progress Notes (Signed)
 Negative for UTI. Patient does not have to complete Abx. Please follow up if symptoms continue.

## 2024-03-26 DIAGNOSIS — L820 Inflamed seborrheic keratosis: Secondary | ICD-10-CM | POA: Diagnosis not present

## 2024-03-26 DIAGNOSIS — C44629 Squamous cell carcinoma of skin of left upper limb, including shoulder: Secondary | ICD-10-CM | POA: Diagnosis not present

## 2024-03-26 DIAGNOSIS — D485 Neoplasm of uncertain behavior of skin: Secondary | ICD-10-CM | POA: Diagnosis not present

## 2024-03-26 DIAGNOSIS — L570 Actinic keratosis: Secondary | ICD-10-CM | POA: Diagnosis not present

## 2024-03-26 DIAGNOSIS — L538 Other specified erythematous conditions: Secondary | ICD-10-CM | POA: Diagnosis not present

## 2024-03-26 DIAGNOSIS — L2989 Other pruritus: Secondary | ICD-10-CM | POA: Diagnosis not present

## 2024-03-26 DIAGNOSIS — R208 Other disturbances of skin sensation: Secondary | ICD-10-CM | POA: Diagnosis not present

## 2024-03-30 ENCOUNTER — Encounter: Payer: Self-pay | Admitting: Pulmonary Disease

## 2024-03-30 ENCOUNTER — Ambulatory Visit: Payer: Medicare Other | Admitting: Pulmonary Disease

## 2024-03-30 VITALS — BP 107/65 | HR 102

## 2024-03-30 DIAGNOSIS — D696 Thrombocytopenia, unspecified: Secondary | ICD-10-CM | POA: Diagnosis not present

## 2024-03-30 DIAGNOSIS — J849 Interstitial pulmonary disease, unspecified: Secondary | ICD-10-CM | POA: Diagnosis not present

## 2024-03-30 DIAGNOSIS — N189 Chronic kidney disease, unspecified: Secondary | ICD-10-CM | POA: Diagnosis not present

## 2024-03-30 DIAGNOSIS — M069 Rheumatoid arthritis, unspecified: Secondary | ICD-10-CM | POA: Diagnosis not present

## 2024-03-30 DIAGNOSIS — Z5181 Encounter for therapeutic drug level monitoring: Secondary | ICD-10-CM

## 2024-03-30 LAB — PULMONARY FUNCTION TEST
DL/VA % pred: 70 %
DL/VA: 2.95 ml/min/mmHg/L
DLCO cor % pred: 64 %
DLCO cor: 9.88 ml/min/mmHg
DLCO unc % pred: 63 %
DLCO unc: 9.69 ml/min/mmHg
FEF 25-75 Post: 1.44 L/s
FEF 25-75 Pre: 1.09 L/s
FEF2575-%Change-Post: 32 %
FEF2575-%Pred-Post: 219 %
FEF2575-%Pred-Pre: 165 %
FEV1-%Change-Post: 10 %
FEV1-%Pred-Post: 140 %
FEV1-%Pred-Pre: 127 %
FEV1-Post: 1.63 L
FEV1-Pre: 1.48 L
FEV1FVC-%Change-Post: 8 %
FEV1FVC-%Pred-Pre: 100 %
FEV6-%Change-Post: 3 %
FEV6-%Pred-Post: 140 %
FEV6-%Pred-Pre: 135 %
FEV6-Post: 2.07 L
FEV6-Pre: 2 L
FEV6FVC-%Pred-Post: 108 %
FEV6FVC-%Pred-Pre: 108 %
FVC-%Change-Post: 1 %
FVC-%Pred-Post: 129 %
FVC-%Pred-Pre: 126 %
FVC-Post: 2.08 L
FVC-Pre: 2.04 L
Post FEV1/FVC ratio: 78 %
Post FEV6/FVC ratio: 100 %
Pre FEV1/FVC ratio: 72 %
Pre FEV6/FVC Ratio: 100 %
RV % pred: 89 %
RV: 2.08 L
TLC % pred: 97 %
TLC: 4.21 L

## 2024-03-30 NOTE — Progress Notes (Signed)
 Full PFT performed today.

## 2024-03-30 NOTE — Patient Instructions (Signed)
 VISIT SUMMARY:  Today, we discussed your increased shortness of breath, particularly during activities like climbing stairs and playing golf. We reviewed your current medications and overall health, including your rheumatoid arthritis with interstitial lung disease, chronic kidney disease, atrial fibrillation, and thrombocytopenia. We also talked about your recent flu and its impact on your breathing and medication adherence.  YOUR PLAN:  -RHEUMATOID ARTHRITIS WITH INTERSTITIAL LUNG DISEASE: Rheumatoid arthritis with interstitial lung disease means that your rheumatoid arthritis has affected your lungs, causing scarring and breathing difficulties. Your lung function tests show stability with minimal progression. Continue taking Esbriet  and azathioprine as prescribed. If your shortness of breath continues, consider pulmonary rehabilitation. For throat irritation, you can try over-the-counter options like Nasonex, Zyrtec, and Prilosec.   INSTRUCTIONS:  Please continue taking your medications as prescribed. If your shortness of breath persists, consider pulmonary rehabilitation. For throat irritation, you can try over-the-counter options like Nasonex, Zyrtec, and Prilosec. Attend your nephrology appointment on June 19. Continue with your current management plan for atrial fibrillation and monitor your platelet levels as needed.

## 2024-03-30 NOTE — Patient Instructions (Signed)
 Full PFT performed today.

## 2024-03-30 NOTE — Progress Notes (Signed)
 Susan Fitzgerald    [id number]    07-23-1932  Primary Care Physician:Gottschalk, Gwendalyn Lemma, DO  Referring Physician: Eliodoro Guerin, DO [address] [place],  [place] [address]  Problem list: RA ILD On imuran Ofev  poorly tolerated due to side effects On Esbriet  since December 2020  HPI: 89 y.o.  with past medical history of atrial fibrillation, rheumatoid arthritis, pulmonary fibrosis.  Referred here for a second opinion on pulmonary fibrosis from Dr. Thelda Finney  She has been diagnosed with rheumatoid arthritis around 2017.  She follows with Dr. Ebbie Goldmann.  She was initially on methotrexate  which was held briefly in 2019 due to elevated creatinine, but restarted around November 2019 for worsening symptoms.  She did not tolerate leflunomide due to significant hair loss.  Methotrexate  was stopped and she was started started on azathioprine after discussion with Dr. Ebbie Goldmann in March 2020  Started on Ofev  2020 for progressive fibrosing interstitial lung disease in probable UIP pattern.  This was poorly tolerated due to diarrhea, constipation and lower GI bleed Antifibrotic's change to Esbriet  in December 2020 which is tolerated much better than Ofev  Esbriet  was changed to generic pirfenidone  in summer 2022.  She does not like the full effect as the pills are bigger and went back to Esbriet  in September 2022  Finished pulmonary rehab in 2020 with improvement in symptoms of dyspnea.  She is back to golfing  Diagnosed with COVID-07 October 2019.  Did not require hospitalization. Follows with Dr. Lavonne Prairie for management of atrial fibrillation  Extended ILD questionnaire 02/12/2019-no known exposures, no mold, hot tub, Jacuzzi, humidifier, no birds at home. Smoking history: Never smoker Travel history: No significant travel history Relevant family history: No family history of lung disease or pulmonary fibrosis.  Interim history: Discussed the use of AI scribe software for clinical  note transcription with the patient, who gave verbal consent to proceed.  History of Present Illness Susan Fitzgerald is an 89 year old female with rheumatoid arthritis interstitial lung disease who presents with increased shortness of breath.    She has experienced increased shortness of breath over the past two to two and a half weeks, particularly during activities such as climbing stairs, walking up her driveway, and playing golf. She has reduced her golf activity from eighteen holes to nine due to fatigue. She had the flu a little over two weeks ago, which initially affected her breathing, but she feels that has mostly resolved.  She is currently on Esbriet  (pirfenidone ) and azathioprine 50 mg for her rheumatoid arthritis interstitial lung disease. She previously took amiodarone  and methotrexate  but was taken off these medications after developing lung issues. She had difficulty tolerating nintedanib in the past. She sometimes misses doses of Esbriet  due to a loss of appetite during her flu illness, but she always takes at least two doses a day.  She reports a raspy voice and throat irritation, which she attributes to phlegm resting in her throat. She does not typically cough up phlegm unless she is ill, as was the case during her recent flu. She is not currently taking any medication for postnasal drip or acid reflux. She is concerned about her ability to communicate effectively, especially as her husband has declining hearing and vision.  She has a history of thrombocytopenia and was under the care of a PA named Rosebud Confer for this condition. Extensive labs were conducted in February, and she was  released from care as her platelet levels were stable. She also has chronic kidney disease with stable creatinine levels and has been seeing a urologist, Dr. Valeta Gaudier, for recurrent UTIs. She is scheduled to see a nephrologist, Dr. Higinio Love, in June.  She has a history of atrial fibrillation and mentions that  she stays in Afib all the time. She completed pulmonary rehab in 2020, attending twice a week for three months, and has previously participated in cardiac rehab.   Outpatient Encounter Medications as of 03/30/2024  Medication Sig   apixaban  (ELIQUIS ) 2.5 MG TABS tablet Take 1 tablet (2.5 mg total) by mouth 2 (two) times daily.   azaTHIOprine (IMURAN) 50 MG tablet Take 50 mg by mouth daily.    Calcium  Carbonate-Vitamin D  600-10 MG-MCG TABS Take 1 tablet by mouth 2 (two) times daily.   diltiazem  (CARDIZEM  CD) 300 MG 24 hr capsule Take 1 capsule (300 mg total) by mouth daily.   ESBRIET  267 MG TABS Take 3 tablets (801 mg total) by mouth with breakfast, with lunch, and with evening meal.   furosemide  (LASIX ) 20 MG tablet Take 1 tablet (20 mg total) by mouth daily.   gabapentin  (NEURONTIN ) 100 MG capsule Take 100 mg by mouth at bedtime.    levothyroxine  (SYNTHROID ) 75 MCG tablet Take 1 tablet (75 mcg total) by mouth daily before breakfast.   lisinopril  (ZESTRIL ) 20 MG tablet TAKE 1 TABLET BY MOUTH DAILY   lisinopril -hydrochlorothiazide  (ZESTORETIC ) 20-12.5 MG tablet TAKE 1 TABLET BY MOUTH DAILY IN  THE MORNING   Melatonin 5 MG TABS Take 5 mg by mouth at bedtime.   Multiple Vitamin (MULTIVITAMIN) capsule Take 1 capsule by mouth daily.   ondansetron  (ZOFRAN -ODT) 4 MG disintegrating tablet Take 1 tablet (4 mg total) by mouth every 8 (eight) hours as needed for nausea or vomiting.   Probiotic Product (PROBIOTIC-10 ULTIMATE PO) Take 1 tablet by mouth daily.   sulfamethoxazole -trimethoprim  (BACTRIM  DS) 800-160 MG tablet Take 1 tablet by mouth 2 (two) times daily.   benzonatate  (TESSALON  PERLES) 100 MG capsule Take 1 capsule (100 mg total) by mouth 3 (three) times daily as needed. (Patient not taking: Reported on 03/30/2024)   estradiol (ESTRACE) 0.1 MG/GM vaginal cream Place 1 Applicatorful vaginally at bedtime. (Patient not taking: Reported on 03/30/2024)   ketoconazole (NIZORAL) 2 % shampoo Apply 1  Application topically daily as needed for irritation. (Patient not taking: Reported on 03/30/2024)   penicillin v potassium (VEETID) 500 MG tablet Take 500 mg by mouth 4 (four) times daily. (Patient not taking: Reported on 03/30/2024)   potassium chloride  SA (KLOR-CON  M) 20 MEQ tablet TAKE 1 TABLET BY MOUTH DAILY (Patient not taking: Reported on 03/30/2024)   No facility-administered encounter medications on file as of 03/30/2024.   Physical Exam: Blood pressure 107/65, pulse (!) 102, SpO2 95%. Gen:      No acute distress HEENT:  EOMI, sclera anicteric Neck:     No masses; no thyromegaly Lungs:    Bibasilar crackles CV:         Regular rate and rhythm; no murmurs Abd:      + bowel sounds; soft, non-tender; no palpable masses, no distension Ext:    No edema; adequate peripheral perfusion Skin:      Warm and dry; no rash Neuro: alert and oriented x 3 Psych: normal mood and affect   Data Reviewed: Imaging: CT chest 02/09/2017- mild reticulation, interstitial prominence at the bases. CT chest 06/23/2018- groundglass attenuation, interlobular septal thickening, subpleural reticulation mild  air trapping. CT chest 01/13/2019- peripheral and basilar pattern of subpleural reticulation, groundglass and traction bronchiectasis.  No honeycombing.  Probable UIP pattern. CT high-resolution 08/31/2019-stable pulmonary fibrosis probable UIP pattern CT high-resolution 09/06/2020-stable probable UIP pattern pulmonary fibrosis CT high-resolution 09/10/2021-stable pulmonary fibrosis, new 5 mm lung nodule CT high-resolution 09/17/2022-mild progression of probable UIP pulmonary fibrosis.  Left apex lung nodule has resolved. CT high-resolution 11//2024-   mild progression of probable UIP pulmonary fibrosis I reviewed the images personally.  PFTs: 01/20/2019 FVC 2.17 [114%), FEV1 1.67 [120%],/F 77, TLC 78%, DLCO 56% Minimal restriction with moderate diffusion impairment.  06/05/2020 FVC 2.17 [119%], FEV1 1.68  [127%], F/F 77, TLC 4.36 [99%], DLCO 10.30 [65%] Mild diffusion impairment.  Improvement compared to 2020  03/28/2023 FVC 1.92 [190%], FEV1 1.50 [116%], F/F78, TLC 4.12 [92%], DLCO 9.83 [61%] Moderate diffusion impairment with progression compared to 2021  03/30/2024 FVC 2.08 [129%], FEV1 1.63 [140%], F/F78, TLC 4.21 [97%], DLCO corrected 9.88 [64%] Mild diffusion impairment   Labs: CCP 02/15/2016-24 Rheumatoid factor 02/14/2018-23.2  Cardiac: Echocardiogram 04/01/2022-mild reduction in RV systolic function, estimated RVSP 42.2, mildly elevated PA systolic pressure.  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  Other considerations include amiodarone  toxicity but she has been off amiodarone  for some time now.  Methotrexate  can cause pulmonary fibrosis in this pattern as well. Would not recommend work-up for IPF such as lung biopsy as it would not change our recommended management.   Echo in past with mild pulmonary hypertension.  Since she is doing well clinically we will continue to monitor this closely.  Bedside she does not want invasive procedures such as a right heart catheterization  Did not tolerate Ofev  but is doing well with esbriet .  She prefers to take 3 tablets 3 times a day as she does not like the bigger single dose tablet She is also on azathioprine 50 mg per Dr. Ebbie Goldmann, rheumatology Finished pulmonary rehab and remains active with golf Lung function tests indicate stability with minimal progression on CT last year. She reports increased dyspnea with activities such as stair climbing and playing golf. A recent influenza episode affected her appetite and medication adherence. Pulmonary rehabilitation was discussed as a potential option if dyspnea persists.  - Continue Esbriet  and azathioprine therapy.  Labs last month are normal - Consider  pulmonary rehabilitation if dyspnea persists. - Discuss over-the-counter options for throat irritation, including Nasonex, Zyrtec, and Prilosec.  Chronic kidney disease Chronic kidney disease is well-managed with slightly elevated creatinine levels. She is under nephrology care with an upcoming appointment scheduled. - Attend nephrology appointment on June 19.  Thrombocytopenia She has thrombocytopenia and was released from oncology follow-up. No acute issues were discussed during the visit. - Monitor platelet levels as needed.  General Health Maintenance -Received flu shot last week. -Plan to receive COVID-19 booster shot in the near future. -Continue to monitor for need of RSV and pneumonia vaccines.   Plan/Recommendations: - Continue Esbriet , azathioprine - Follow-up labs for monitoring - CT scan in 6 months  Phyllis Breeze MD [facility] 03/30/2024, 1:18 PM  CC: Eliodoro Guerin, DO

## 2024-04-16 DIAGNOSIS — L2989 Other pruritus: Secondary | ICD-10-CM | POA: Diagnosis not present

## 2024-04-16 DIAGNOSIS — Z789 Other specified health status: Secondary | ICD-10-CM | POA: Diagnosis not present

## 2024-04-16 DIAGNOSIS — R208 Other disturbances of skin sensation: Secondary | ICD-10-CM | POA: Diagnosis not present

## 2024-04-16 DIAGNOSIS — L570 Actinic keratosis: Secondary | ICD-10-CM | POA: Diagnosis not present

## 2024-04-16 DIAGNOSIS — L538 Other specified erythematous conditions: Secondary | ICD-10-CM | POA: Diagnosis not present

## 2024-04-16 DIAGNOSIS — L820 Inflamed seborrheic keratosis: Secondary | ICD-10-CM | POA: Diagnosis not present

## 2024-04-21 DIAGNOSIS — M1991 Primary osteoarthritis, unspecified site: Secondary | ICD-10-CM | POA: Diagnosis not present

## 2024-04-21 DIAGNOSIS — N1831 Chronic kidney disease, stage 3a: Secondary | ICD-10-CM | POA: Diagnosis not present

## 2024-04-21 DIAGNOSIS — M5136 Other intervertebral disc degeneration, lumbar region with discogenic back pain only: Secondary | ICD-10-CM | POA: Diagnosis not present

## 2024-04-21 DIAGNOSIS — M0579 Rheumatoid arthritis with rheumatoid factor of multiple sites without organ or systems involvement: Secondary | ICD-10-CM | POA: Diagnosis not present

## 2024-04-21 DIAGNOSIS — M25512 Pain in left shoulder: Secondary | ICD-10-CM | POA: Diagnosis not present

## 2024-04-21 DIAGNOSIS — J849 Interstitial pulmonary disease, unspecified: Secondary | ICD-10-CM | POA: Diagnosis not present

## 2024-04-21 DIAGNOSIS — M65331 Trigger finger, right middle finger: Secondary | ICD-10-CM | POA: Diagnosis not present

## 2024-04-21 DIAGNOSIS — Z79899 Other long term (current) drug therapy: Secondary | ICD-10-CM | POA: Diagnosis not present

## 2024-04-21 DIAGNOSIS — R7989 Other specified abnormal findings of blood chemistry: Secondary | ICD-10-CM | POA: Diagnosis not present

## 2024-04-21 DIAGNOSIS — Z6821 Body mass index (BMI) 21.0-21.9, adult: Secondary | ICD-10-CM | POA: Diagnosis not present

## 2024-04-21 DIAGNOSIS — M5030 Other cervical disc degeneration, unspecified cervical region: Secondary | ICD-10-CM | POA: Diagnosis not present

## 2024-04-21 LAB — LAB REPORT - SCANNED: EGFR: 45

## 2024-05-06 DIAGNOSIS — I129 Hypertensive chronic kidney disease with stage 1 through stage 4 chronic kidney disease, or unspecified chronic kidney disease: Secondary | ICD-10-CM | POA: Diagnosis not present

## 2024-05-06 DIAGNOSIS — M069 Rheumatoid arthritis, unspecified: Secondary | ICD-10-CM | POA: Diagnosis not present

## 2024-05-06 DIAGNOSIS — N390 Urinary tract infection, site not specified: Secondary | ICD-10-CM | POA: Diagnosis not present

## 2024-05-06 DIAGNOSIS — N1831 Chronic kidney disease, stage 3a: Secondary | ICD-10-CM | POA: Diagnosis not present

## 2024-05-06 DIAGNOSIS — N1832 Chronic kidney disease, stage 3b: Secondary | ICD-10-CM | POA: Diagnosis not present

## 2024-05-08 ENCOUNTER — Other Ambulatory Visit: Payer: Self-pay | Admitting: Family Medicine

## 2024-05-10 ENCOUNTER — Ambulatory Visit (INDEPENDENT_AMBULATORY_CARE_PROVIDER_SITE_OTHER): Payer: Medicare Other | Admitting: Family Medicine

## 2024-05-10 ENCOUNTER — Ambulatory Visit: Payer: Medicare Other | Admitting: Family Medicine

## 2024-05-10 ENCOUNTER — Encounter: Payer: Self-pay | Admitting: Family Medicine

## 2024-05-10 VITALS — BP 121/76 | HR 89 | Temp 98.5°F | Ht 59.0 in | Wt 116.8 lb

## 2024-05-10 DIAGNOSIS — H6121 Impacted cerumen, right ear: Secondary | ICD-10-CM | POA: Diagnosis not present

## 2024-05-10 DIAGNOSIS — E039 Hypothyroidism, unspecified: Secondary | ICD-10-CM

## 2024-05-10 NOTE — Progress Notes (Signed)
 Subjective: CC: Hypothyroidism PCP: Jolinda Norene HERO, DO YEP:Ejudb S Bronkema is a 89 y.o. female presenting to clinic today for:  1.  Hypothyroidism Patient reports compliance with medications.  No reports of heart palpitations or tremor.  Compliant with Eliquis  for A-fib.  Would like samples if we have them  ROS: Per HPI  Allergies  Allergen Reactions   Benazepril Hcl Cough   Past Medical History:  Diagnosis Date   Calcification of bronchial airway 06/23/2018   01/2017 - See on CT imaging of the chest    Cataract    Cerebral vascular disease 06/19/2018   06/19/2018 - MRI with chronic microvascular ischemic change   Diverticulosis    Hypertension    Osteoporosis    PAF (paroxysmal atrial fibrillation) (HCC)    a. s/p prior DCCV; b. On flecainide  & coumadin  (CHA2DS2VASc = 4);  c. 03/2016 Echo: EF 55-60%, mod LVH, mod AI, mild to mod TR, PASP .   Pelvic fracture (HCC)    Pulmonary fibrosis (HCC) 2018   Rheumatoid arthritis (HCC) 2017    Current Outpatient Medications:    apixaban  (ELIQUIS ) 2.5 MG TABS tablet, Take 1 tablet (2.5 mg total) by mouth 2 (two) times daily., Disp: 28 tablet, Rfl: 0   azaTHIOprine (IMURAN) 50 MG tablet, Take 50 mg by mouth daily. , Disp: , Rfl:    benzonatate  (TESSALON  PERLES) 100 MG capsule, Take 1 capsule (100 mg total) by mouth 3 (three) times daily as needed. (Patient not taking: Reported on 03/30/2024), Disp: 30 capsule, Rfl: 0   Calcium  Carbonate-Vitamin D  600-10 MG-MCG TABS, Take 1 tablet by mouth 2 (two) times daily., Disp: , Rfl:    diltiazem  (CARDIZEM  CD) 300 MG 24 hr capsule, Take 1 capsule (300 mg total) by mouth daily., Disp: 100 capsule, Rfl: 3   ESBRIET  267 MG TABS, Take 3 tablets (801 mg total) by mouth with breakfast, with lunch, and with evening meal., Disp: 810 tablet, Rfl: 1   estradiol (ESTRACE) 0.1 MG/GM vaginal cream, Place 1 Applicatorful vaginally at bedtime. (Patient not taking: Reported on 03/30/2024), Disp: , Rfl:     furosemide  (LASIX ) 20 MG tablet, Take 1 tablet (20 mg total) by mouth daily., Disp: 200 tablet, Rfl: 3   gabapentin  (NEURONTIN ) 100 MG capsule, Take 100 mg by mouth at bedtime. , Disp: , Rfl:    ketoconazole (NIZORAL) 2 % shampoo, Apply 1 Application topically daily as needed for irritation. (Patient not taking: Reported on 03/30/2024), Disp: , Rfl:    levothyroxine  (SYNTHROID ) 75 MCG tablet, Take 1 tablet (75 mcg total) by mouth daily before breakfast., Disp: 100 tablet, Rfl: 3   lisinopril  (ZESTRIL ) 20 MG tablet, TAKE 1 TABLET BY MOUTH DAILY, Disp: 90 tablet, Rfl: 3   lisinopril -hydrochlorothiazide  (ZESTORETIC ) 20-12.5 MG tablet, TAKE 1 TABLET BY MOUTH DAILY IN  THE MORNING, Disp: 90 tablet, Rfl: 3   Melatonin 5 MG TABS, Take 5 mg by mouth at bedtime., Disp: , Rfl:    Multiple Vitamin (MULTIVITAMIN) capsule, Take 1 capsule by mouth daily., Disp: , Rfl:    ondansetron  (ZOFRAN -ODT) 4 MG disintegrating tablet, Take 1 tablet (4 mg total) by mouth every 8 (eight) hours as needed for nausea or vomiting., Disp: 20 tablet, Rfl: 0   penicillin v potassium (VEETID) 500 MG tablet, Take 500 mg by mouth 4 (four) times daily. (Patient not taking: Reported on 03/30/2024), Disp: , Rfl:    potassium chloride  SA (KLOR-CON  M) 20 MEQ tablet, TAKE 1 TABLET BY MOUTH DAILY, Disp:  100 tablet, Rfl: 0   Probiotic Product (PROBIOTIC-10 ULTIMATE PO), Take 1 tablet by mouth daily., Disp: , Rfl:    sulfamethoxazole -trimethoprim  (BACTRIM  DS) 800-160 MG tablet, Take 1 tablet by mouth 2 (two) times daily., Disp: 6 tablet, Rfl: 0 Social History   Socioeconomic History   Marital status: Married    Spouse name: Bill    Number of children: 2   Years of education: 12   Highest education level: 12th grade  Occupational History   Occupation: RETIRED  Tobacco Use   Smoking status: Never   Smokeless tobacco: Never  Vaping Use   Vaping status: Never Used  Substance and Sexual Activity   Alcohol use: No   Drug use: No   Sexual  activity: Yes    Birth control/protection: Post-menopausal  Other Topics Concern   Not on file  Social History Narrative   Lives with her husband.   Enjoys playing golf.   Still very active and independent   Social Drivers of Corporate investment banker Strain: Low Risk  (12/30/2023)   Overall Financial Resource Strain (CARDIA)    Difficulty of Paying Living Expenses: Not hard at all  Food Insecurity: No Food Insecurity (12/30/2023)   Hunger Vital Sign    Worried About Running Out of Food in the Last Year: Never true    Ran Out of Food in the Last Year: Never true  Transportation Needs: No Transportation Needs (12/30/2023)   PRAPARE - Administrator, Civil Service (Medical): No    Lack of Transportation (Non-Medical): No  Physical Activity: Insufficiently Active (12/30/2023)   Exercise Vital Sign    Days of Exercise per Week: 3 days    Minutes of Exercise per Session: 30 min  Stress: No Stress Concern Present (12/30/2023)   [employer] of Occupational Health - Occupational Stress Questionnaire    Feeling of Stress : Only a little  Social Connections: Socially Integrated (12/30/2023)   Social Connection and Isolation Panel    Frequency of Communication with Friends and Family: More than three times a week    Frequency of Social Gatherings with Friends and Family: Twice a week    Attends Religious Services: More than 4 times per year    Active Member of [employer] or Organizations: Yes    Attends Engineer, structural: More than 4 times per year    Marital Status: Married  Catering manager Violence: Not At Risk (12/30/2023)   Humiliation, Afraid, Rape, and Kick questionnaire    Fear of Current or Ex-Partner: No    Emotionally Abused: No    Physically Abused: No    Sexually Abused: No   Family History  Problem Relation Age of Onset   Stroke Mother        cerebral hemorrhage   Heart disease Father        MI   Heart attack Father    Vision loss Father     Heart disease Brother    Heart attack Brother    Cancer Maternal Aunt        breast   Cancer Brother    Heart disease Brother    Cancer Daughter        breast   Heart disease Son    Colon cancer Neg Hx    Colon polyps Neg Hx    Rectal cancer Neg Hx    Stomach cancer Neg Hx     Objective: Office vital signs reviewed. BP 121/76   Pulse  89   Temp 98.5 F (36.9 C)   Ht 4' 11 (1.499 m)   Wt 116 lb 12.8 oz (53 kg)   SpO2 98%   BMI 23.59 kg/m   Physical Examination:  General: Awake, alert, well nourished, No acute distress HEENT: 60% of the right TM obscured by cerumen Cardio: Irregularly irregular with rate control, S1S2 heard, no murmurs appreciated Pulm: clear to auscultation bilaterally, no wheezes, rhonchi or rales; normal work of breathing on room air Neuro: No tremor    Assessment/ Plan: 89 y.o. female   Acquired hypothyroidism - Plan: TSH + free T4  Impacted cerumen of right ear  Check thyroid  levels.  Clinically euthymic.  Cerumen irrigated out of right ear   Norene CHRISTELLA Fielding, DO [facility] [phone]

## 2024-05-11 ENCOUNTER — Ambulatory Visit: Payer: Self-pay | Admitting: Family Medicine

## 2024-05-11 LAB — TSH+FREE T4
Free T4: 1.35 ng/dL (ref 0.82–1.77)
TSH: 1.2 u[IU]/mL (ref 0.450–4.500)

## 2024-05-14 ENCOUNTER — Telehealth: Payer: Self-pay

## 2024-05-14 NOTE — Telephone Encounter (Signed)
 Will await form from [facility]

## 2024-05-14 NOTE — Telephone Encounter (Signed)
 Copied from CRM [id number]. Topic: Clinical - Prescription Issue >> May 14, 2024  1:18 PM Celestine FALCON wrote: Reason for CRM: Mallie from [employer] of ESBRIET  267 MG TABS called to report an adverse incident report involving Dr. Jiles pt and had some questions for Dr. Theophilus and/or someone from the clinical staff. She stated she would be faxing all of this over at [phone] Case #- [id number] Reference #- [id number].

## 2024-05-24 ENCOUNTER — Other Ambulatory Visit: Payer: Self-pay | Admitting: Pulmonary Disease

## 2024-05-24 DIAGNOSIS — J849 Interstitial pulmonary disease, unspecified: Secondary | ICD-10-CM

## 2024-05-24 NOTE — Telephone Encounter (Unsigned)
 Copied from CRM [id number]. Topic: Clinical - Medication Refill >> May 24, 2024  9:58 AM Joesph PARAS wrote: Medication: ESBRIET  267 MG TABS  Has the patient contacted their pharmacy? Yes (Agent: If no, request that the patient contact the pharmacy for the refill. If patient does not wish to contact the pharmacy document the reason why and proceed with request.) (Agent: If yes, when and what did the pharmacy advise?)  This is the patient's preferred pharmacy:  [facility] (per patient) Phone Number: [phone]  Is this the correct pharmacy for this prescription? Yes If no, delete pharmacy and type the correct one.   Has the prescription been filled recently? No  Is the patient out of the medication? Yes  Has the patient been seen for an appointment in the last year OR does the patient have an upcoming appointment? Yes  Can we respond through MyChart? Yes  Agent: Please be advised that Rx refills may take up to 3 business days. We ask that you follow-up with your pharmacy.

## 2024-05-24 NOTE — Telephone Encounter (Signed)
 Esbriet refill

## 2024-05-25 MED ORDER — ESBRIET 267 MG PO TABS: 801.0000 mg | ORAL_TABLET | Freq: Three times a day (TID) | ORAL | 1 refills | Status: DC

## 2024-05-25 NOTE — Telephone Encounter (Signed)
 Refill sent for ESBRIET  to [facility] ([facility]) for Esbriet : [phone]  Dose: 801mg  three times daily  Last OV: 03/30/2024 Provider: Dr. Theophilus Pertinent labs: LFTs on 04/21/2024  Next OV: due in Nov 2025  Sherry Pennant, PharmD, MPH, BCPS Clinical Pharmacist (Rheumatology and Pulmonology)

## 2024-05-30 ENCOUNTER — Other Ambulatory Visit: Payer: Self-pay | Admitting: Family Medicine

## 2024-07-01 NOTE — Telephone Encounter (Signed)
 error

## 2024-07-14 DIAGNOSIS — L814 Other melanin hyperpigmentation: Secondary | ICD-10-CM | POA: Diagnosis not present

## 2024-07-14 DIAGNOSIS — L578 Other skin changes due to chronic exposure to nonionizing radiation: Secondary | ICD-10-CM | POA: Diagnosis not present

## 2024-07-14 DIAGNOSIS — L538 Other specified erythematous conditions: Secondary | ICD-10-CM | POA: Diagnosis not present

## 2024-07-14 DIAGNOSIS — D485 Neoplasm of uncertain behavior of skin: Secondary | ICD-10-CM | POA: Diagnosis not present

## 2024-07-14 DIAGNOSIS — D0472 Carcinoma in situ of skin of left lower limb, including hip: Secondary | ICD-10-CM | POA: Diagnosis not present

## 2024-07-14 DIAGNOSIS — L821 Other seborrheic keratosis: Secondary | ICD-10-CM | POA: Diagnosis not present

## 2024-07-14 DIAGNOSIS — L570 Actinic keratosis: Secondary | ICD-10-CM | POA: Diagnosis not present

## 2024-07-14 DIAGNOSIS — L820 Inflamed seborrheic keratosis: Secondary | ICD-10-CM | POA: Diagnosis not present

## 2024-07-16 ENCOUNTER — Ambulatory Visit (INDEPENDENT_AMBULATORY_CARE_PROVIDER_SITE_OTHER): Admitting: Family Medicine

## 2024-07-16 ENCOUNTER — Encounter: Payer: Self-pay | Admitting: Family Medicine

## 2024-07-16 VITALS — BP 115/69 | HR 85 | Temp 98.3°F | Ht 59.0 in | Wt 114.0 lb

## 2024-07-16 DIAGNOSIS — I4819 Other persistent atrial fibrillation: Secondary | ICD-10-CM

## 2024-07-16 DIAGNOSIS — M7542 Impingement syndrome of left shoulder: Secondary | ICD-10-CM

## 2024-07-16 MED ORDER — PREDNISONE 20 MG PO TABS
40.0000 mg | ORAL_TABLET | Freq: Every day | ORAL | 0 refills | Status: AC
Start: 2024-07-16 — End: 2024-07-21

## 2024-07-16 NOTE — Progress Notes (Signed)
 Subjective:  Patient ID: Susan Fitzgerald, female    DOB: 1932-06-13, 89 y.o.   MRN: [id number]  Patient Care Team: Jolinda Norene HERO, DO as PCP - General (Family Medicine) Lavona Agent, MD as PCP - Cardiology (Cardiology) Ivin Kocher, MD as Consulting Physician (Dermatology) Mai Agent FALCON, MD as Consulting Physician (Rheumatology) Letha Cancer, MD as Consulting Physician (Physical Medicine and Rehabilitation) Billee Mliss BIRCH, [facility] as Pharmacist (Family Medicine) Mannam, Praveen, MD as Consulting Physician (Pulmonary Disease) Lavona Agent, MD as Consulting Physician (Cardiology)   Chief Complaint:  lef arm/shoulder pain (X 3 days/Improving/Pt played golf Monday (18 holes) and Tuesday (9 holes))   HPI: Susan Fitzgerald is a 89 y.o. female presenting on 07/16/2024 for lef arm/shoulder pain (X 3 days/Improving/Pt played golf Monday (18 holes) and Tuesday (9 holes))   Susan Fitzgerald is a 89 year old female with rheumatoid arthritis who presents with left shoulder pain.  She experienced severe pain in her left arm upon waking up on Tuesday night. The pain is intense, located around the shoulder, extending down the back of the arm and underneath it. It is exacerbated by certain movements, particularly when putting her arm behind her back, such as when hooking or unhooking her bra.  She played 18 holes of golf on Monday and 9 holes on Tuesday, which is her usual activity level, and does not recall any specific incident that might have triggered the pain. She has been using Tylenol  for pain relief, which has provided some improvement, but the pain persists and feels tight. She also applied topical treatments like Stop Pain and [facility], which she regularly uses for her back when playing golf.  She has a history of rheumatoid arthritis and has been diagnosed with this condition for years. She is currently taking Eliquis  2.5 mg. She has previously used prednisone  for inflammation due  to her high blood pressure, which prevents her from using NSAIDs.  Her social history includes a long-standing passion for golf, which she has played for over 50 years. Despite her age, she remains active, playing golf regularly and maintaining an independent lifestyle with her 75 year old husband. She manages her own household and attends all medical appointments independently.  Reports pain in the left shoulder, particularly underneath and down the back of the arm, with difficulty in movements like putting her arm behind her back.          Relevant past medical, surgical, family, and social history reviewed and updated as indicated.  Allergies and medications reviewed and updated. Data reviewed: Chart in Epic.   Past Medical History:  Diagnosis Date   Calcification of bronchial airway 06/23/2018   01/2017 - See on CT imaging of the chest    Cataract    Cerebral vascular disease 06/19/2018   06/19/2018 - MRI with chronic microvascular ischemic change   Diverticulosis    Hypertension    Osteoporosis    PAF (paroxysmal atrial fibrillation) (HCC)    a. s/p prior DCCV; b. On flecainide  & coumadin  (CHA2DS2VASc = 4);  c. 03/2016 Echo: EF 55-60%, mod LVH, mod AI, mild to mod TR, PASP .   Pelvic fracture (HCC)    Pulmonary fibrosis (HCC) 2018   Rheumatoid arthritis (HCC) 2017    Past Surgical History:  Procedure Laterality Date   BIOPSY BREAST     BIOPSY BREAST     right & benign   BREAST SURGERY     CARDIOVERSION N/A 10/30/2016   Procedure: CARDIOVERSION;  Surgeon: Ezra GORMAN Shuck, MD;  Location[facility] ENDOSCOPY;  Service: Cardiovascular;  Laterality: N/A;   CARDIOVERSION N/A 08/20/2017   Procedure: CARDIOVERSION;  Surgeon: Jeffrie Oneil BROCKS, MD;  Location[facility] ENDOSCOPY;  Service: Cardiovascular;  Laterality: N/A;   CARDIOVERSION N/A 02/02/2020   Procedure: CARDIOVERSION;  Surgeon: Barbaraann Darryle Ned, MD;  Location: [facility] ENDOSCOPY;  Service: Cardiovascular;  Laterality: N/A;    CATARACT EXTRACTION     COLONOSCOPY     EYE SURGERY     cataracts   INGUINAL HERNIA REPAIR Left 07/18/2022   Procedure: LAPAROSCOPIC LEFT INGUINAL HERNIA REPAIR WITH MESH POSSIBLE OPEN;  Surgeon: Rubin Calamity, MD;  Location: [facility] OR;  Service: General;  Laterality: Left;   TEE WITHOUT CARDIOVERSION N/A 10/30/2016   Procedure: TRANSESOPHAGEAL ECHOCARDIOGRAM (TEE);  Surgeon: Ezra GORMAN Shuck, MD;  Location: [facility] ENDOSCOPY;  Service: Cardiovascular;  Laterality: N/A;   TOTAL HIP ARTHROPLASTY Right 01/27/2017   Procedure: TOTAL HIP ARTHROPLASTY ANTERIOR APPROACH;  Surgeon: Redell Shoals, MD;  Location: [facility] OR;  Service: Orthopedics;  Laterality: Right;    Social History   Socioeconomic History   Marital status: Married    Spouse name: Bill    Number of children: 2   Years of education: 12   Highest education level: 12th grade  Occupational History   Occupation: RETIRED  Tobacco Use   Smoking status: Never   Smokeless tobacco: Never  Vaping Use   Vaping status: Never Used  Substance and Sexual Activity   Alcohol use: No   Drug use: No   Sexual activity: Yes    Birth control/protection: Post-menopausal  Other Topics Concern   Not on file  Social History Narrative   Lives with her husband.   Enjoys playing golf.   Still very active and independent   Social Drivers of Corporate investment banker Strain: Low Risk  (12/30/2023)   Overall Financial Resource Strain (CARDIA)    Difficulty of Paying Living Expenses: Not hard at all  Food Insecurity: No Food Insecurity (12/30/2023)   Hunger Vital Sign    Worried About Running Out of Food in the Last Year: Never true    Ran Out of Food in the Last Year: Never true  Transportation Needs: No Transportation Needs (12/30/2023)   PRAPARE - Administrator, Civil Service (Medical): No    Lack of Transportation (Non-Medical): No  Physical Activity: Insufficiently Active (12/30/2023)   Exercise Vital Sign    Days of Exercise per  Week: 3 days    Minutes of Exercise per Session: 30 min  Stress: No Stress Concern Present (12/30/2023)   [employer] of Occupational Health - Occupational Stress Questionnaire    Feeling of Stress : Only a little  Social Connections: Socially Integrated (12/30/2023)   Social Connection and Isolation Panel    Frequency of Communication with Friends and Family: More than three times a week    Frequency of Social Gatherings with Friends and Family: Twice a week    Attends Religious Services: More than 4 times per year    Active Member of [employer] or Organizations: Yes    Attends Banker Meetings: More than 4 times per year    Marital Status: Married  Catering manager Violence: Not At Risk (12/30/2023)   Humiliation, Afraid, Rape, and Kick questionnaire    Fear of Current or Ex-Partner: No    Emotionally Abused: No    Physically Abused: No    Sexually Abused: No    Outpatient Encounter  Medications as of 07/16/2024  Medication Sig   apixaban  (ELIQUIS ) 2.5 MG TABS tablet Take 1 tablet (2.5 mg total) by mouth 2 (two) times daily.   azaTHIOprine (IMURAN) 50 MG tablet Take 50 mg by mouth daily.    Calcium  Carbonate-Vitamin D  600-10 MG-MCG TABS Take 1 tablet by mouth 2 (two) times daily.   diltiazem  (CARDIZEM  CD) 300 MG 24 hr capsule Take 1 capsule (300 mg total) by mouth daily.   ESBRIET  267 MG TABS Take 3 tablets (801 mg total) by mouth with breakfast, with lunch, and with evening meal.   estradiol (ESTRACE) 0.1 MG/GM vaginal cream Place 1 Applicatorful vaginally at bedtime.   furosemide  (LASIX ) 20 MG tablet Take 1 tablet (20 mg total) by mouth daily.   gabapentin  (NEURONTIN ) 100 MG capsule Take 100 mg by mouth at bedtime.    ketoconazole (NIZORAL) 2 % shampoo Apply 1 Application topically daily as needed for irritation.   levothyroxine  (SYNTHROID ) 75 MCG tablet Take 1 tablet (75 mcg total) by mouth daily before breakfast.   lisinopril  (ZESTRIL ) 20 MG tablet TAKE 1 TABLET BY  MOUTH DAILY   lisinopril -hydrochlorothiazide  (ZESTORETIC ) 20-12.5 MG tablet TAKE 1 TABLET BY MOUTH DAILY IN  THE MORNING   Melatonin 5 MG TABS Take 5 mg by mouth at bedtime.   Multiple Vitamin (MULTIVITAMIN) capsule Take 1 capsule by mouth daily.   ondansetron  (ZOFRAN -ODT) 4 MG disintegrating tablet Take 1 tablet (4 mg total) by mouth every 8 (eight) hours as needed for nausea or vomiting.   potassium chloride  SA (KLOR-CON  M) 20 MEQ tablet TAKE 1 TABLET BY MOUTH DAILY   predniSONE  (DELTASONE ) 20 MG tablet Take 2 tablets (40 mg total) by mouth daily with breakfast for 5 days.   Probiotic Product (PROBIOTIC-10 ULTIMATE PO) Take 1 tablet by mouth daily.   [DISCONTINUED] sulfamethoxazole -trimethoprim  (BACTRIM  DS) 800-160 MG tablet Take 1 tablet by mouth 2 (two) times daily.   No facility-administered encounter medications on file as of 07/16/2024.    Allergies  Allergen Reactions   Benazepril Hcl Cough    Pertinent ROS per HPI, otherwise unremarkable      Objective:  BP 115/69   Pulse 85   Temp 98.3 F (36.8 C)   Ht 4' 11 (1.499 m)   Wt 114 lb (51.7 kg)   SpO2 96%   BMI 23.03 kg/m    Wt Readings from Last 3 Encounters:  07/16/24 114 lb (51.7 kg)  05/10/24 116 lb 12.8 oz (53 kg)  03/19/24 117 lb (53.1 kg)    Physical Exam Vitals and nursing note reviewed.  Constitutional:      Appearance: Normal appearance.  HENT:     Head: Normocephalic and atraumatic.     Nose: Nose normal.     Mouth/Throat:     Mouth: Mucous membranes are moist.  Eyes:     Conjunctiva/sclera: Conjunctivae normal.     Pupils: Pupils are equal, round, and reactive to light.  Cardiovascular:     Rate and Rhythm: Normal rate. Rhythm irregularly irregular.     Pulses: Normal pulses.     Heart sounds: Normal heart sounds.  Pulmonary:     Effort: Pulmonary effort is normal.     Breath sounds: Normal breath sounds.  Musculoskeletal:     Left shoulder: Tenderness present. No swelling, deformity,  effusion, laceration, bony tenderness or crepitus. Decreased range of motion. Normal strength. Normal pulse.     Left upper arm: Normal.     Cervical back: Normal, normal range  of motion and neck supple.     Right lower leg: No edema.     Left lower leg: No edema.     Comments: Left shoulder: positive Neer and Hawkins signs. Negative empty can sign  Skin:    General: Skin is warm and dry.     Capillary Refill: Capillary refill takes less than 2 seconds.  Neurological:     General: No focal deficit present.     Mental Status: She is alert and oriented to person, place, and time.  Psychiatric:        Mood and Affect: Mood normal.        Behavior: Behavior normal.        Thought Content: Thought content normal.        Judgment: Judgment normal.     Results for orders placed or performed in visit on 05/10/24  TSH + free T4   Collection Time: 05/10/24  4:08 PM  Result Value Ref Range   TSH 1.200 0.450 - 4.500 uIU/mL   Free T4 1.35 0.82 - 1.77 ng/dL   *Note: Due to a large number of results and/or encounters for the requested time period, some results have not been displayed. A complete set of results can be found in Results Review.       Pertinent labs & imaging results that were available during my care of the patient were reviewed by me and considered in my medical decision making.  Assessment & Plan:  Anea was seen today for lef arm/shoulder pain.  Diagnoses and all orders for this visit:  Impingement syndrome of left shoulder -     predniSONE  (DELTASONE ) 20 MG tablet; Take 2 tablets (40 mg total) by mouth daily with breakfast for 5 days.  Atrial fibrillation, persistent (HCC) Eliquis  samples provided in office today.      Impingement syndrome of left shoulder Acute pain in the left shoulder, likely due to impingement syndrome, exacerbated by certain movements. No complete rotator cuff tear. Likely inflammation of a rotator cuff ligament causing impingement and pain.  Prednisone  chosen due to contraindications with NSAIDs related to hypertension. - Prescribe oral prednisone , 2 tablets daily for 5 days, to reduce inflammation. - Provide exercises on paper for home-based physical therapy. - Advise to avoid movements that exacerbate pain. - Consider referral to physical therapy if no improvement with exercises and prednisone .  Chronic low back pain Long-standing low back pain managed conservatively. Previous neurosurgical evaluations did not recommend surgery. She has considered nerve ablation but has not pursued it due to recurrent UTIs and concerns about potential complications. Agrees that invasive procedures could worsen condition at her age. - Continue conservative management with topical treatments like Icy Hot. - Advise to consider more invasive procedures only if pain becomes unbearable or significantly impacts function.  Rheumatoid arthritis Long-standing diagnosis of rheumatoid arthritis. No specific discussion of current symptoms or management changes in this encounter.  General Health Maintenance Active lifestyle, playing golf regularly, and maintains a social life. She and her husband are independent and manage their own healthcare needs.  Follow-up Discussion of follow-up for shoulder pain if no improvement with current management plan. - Follow up if no improvement in shoulder pain with exercises and prednisone  for potential referral to physical therapy.          Continue all other maintenance medications.  Follow up plan: Return if symptoms worsen or fail to improve.   Continue healthy lifestyle choices, including diet (rich in fruits, vegetables, and lean  proteins, and low in salt and simple carbohydrates) and exercise (at least 30 minutes of moderate physical activity daily).  Educational handout given for impingement syndrome exercises   The above assessment and management plan was discussed with the patient. The patient  verbalized understanding of and has agreed to the management plan. Patient is aware to call the clinic if they develop any new symptoms or if symptoms persist or worsen. Patient is aware when to return to the clinic for a follow-up visit. Patient educated on when it is appropriate to go to the emergency department.   Rosaline Bruns, FNP-C [facility] [phone]

## 2024-07-19 ENCOUNTER — Other Ambulatory Visit: Payer: Self-pay | Admitting: Family Medicine

## 2024-07-21 DIAGNOSIS — M0579 Rheumatoid arthritis with rheumatoid factor of multiple sites without organ or systems involvement: Secondary | ICD-10-CM | POA: Diagnosis not present

## 2024-07-23 ENCOUNTER — Telehealth: Payer: Self-pay | Admitting: Family Medicine

## 2024-07-23 MED ORDER — PREDNISONE 20 MG PO TABS: 40.0000 mg | ORAL_TABLET | Freq: Every day | ORAL | 0 refills | Status: AC

## 2024-07-23 NOTE — Telephone Encounter (Signed)
 Prednisone Prescription sent to pharmacy

## 2024-07-23 NOTE — Telephone Encounter (Signed)
 Left detailed message for patient.

## 2024-07-23 NOTE — Telephone Encounter (Signed)
 Copied from CRM [id number]. Topic: Clinical - Medication Refill >> Jul 23, 2024 11:38 AM Zebedee SAUNDERS wrote: Medication: Pednisone  Has the patient contacted their pharmacy? Yes (Agent: If no, request that the patient contact the pharmacy for the refill. If patient does not wish to contact the pharmacy document the reason why and proceed with request.) (Agent: If yes, when and what did the pharmacy advise?)  This is the patient's preferred pharmacy:   [facility] [place], [place] - [address] [place] [address] Phone: [phone] Fax: [phone]  Is this the correct pharmacy for this prescription? Yes If no, delete pharmacy and type the correct one.   Has the prescription been filled recently? Yes  Is the patient out of the medication? Yes  Has the patient been seen for an appointment in the last year OR does the patient have an upcoming appointment? Yes  Can we respond through MyChart? Yes  Agent: Please be advised that Rx refills may take up to 3 business days. We ask that you follow-up with your pharmacy.

## 2024-07-23 NOTE — Telephone Encounter (Signed)
 Received refill request for med not on current list. Called patient for more information on prednisone  request. Prednisone  helped her shoulder pain tremendously, she feels she needs one more round and will be recovered. She would like prednisone  refilled today if possible or other recommendation by PCP. Please follow up with [facility] [phone]

## 2024-07-26 ENCOUNTER — Telehealth (HOSPITAL_BASED_OUTPATIENT_CLINIC_OR_DEPARTMENT_OTHER): Payer: Self-pay

## 2024-07-26 NOTE — Telephone Encounter (Signed)
 Copied from CRM [id number]. Topic: Clinical - Medication Question >> Jul 26, 2024 10:28 AM Rozanna MATSU wrote: Reason for CRM: pt stated she received a letter from [facility] about the medicine ESBRIET  267 MG TABS [id number] [facility] new starting 10/01) stated this is the number you all need to reach the company for the grant. She stated the letter is advising no more medication orders on the current step up after  10/24. She want to reach out to provider and nurse to see what she needs to do so she can continue to get this medications with grant. She also stated another number to contact is [phone] [place] before 10/01).

## 2024-07-27 NOTE — Telephone Encounter (Signed)
 [facility] at [phone], all team members unavailable due to local emergency.   Called [employer] at [phone]. Advised that the last the last day to order from [employer] will be 09/10/24 with last shipments on 09/17/24. Thereafter, Esbriet /pirfenidone  will need to come from [place]. Per representative, next step is to contact [facility].  Unable to reach [employer] today. Please assist with contacting [facility] to determine what requirements are needed.

## 2024-08-06 NOTE — Telephone Encounter (Signed)
 Update from previous - called [facility] at [phone]. Per recording, patients who are currently enrolled in [facility], there is no further action required. Patients will be automatically enrolled into the [facility] patient program. If additional information is needed, [facility] will reach out to patient or provider on or after 08/20/24.   Communicated this above to the patient. She verbalizes understanding and reports she has plenty of medication supply. She has received communication and has coordinated next shipment.   No further action needed.   Aleck Puls, PharmD, BCPS Clinical Pharmacist  [facility]

## 2024-08-24 ENCOUNTER — Other Ambulatory Visit: Payer: Self-pay | Admitting: Family Medicine

## 2024-09-07 ENCOUNTER — Other Ambulatory Visit: Payer: Self-pay | Admitting: Cardiology

## 2024-09-08 DIAGNOSIS — Z09 Encounter for follow-up examination after completed treatment for conditions other than malignant neoplasm: Secondary | ICD-10-CM | POA: Diagnosis not present

## 2024-09-08 DIAGNOSIS — C44622 Squamous cell carcinoma of skin of right upper limb, including shoulder: Secondary | ICD-10-CM | POA: Diagnosis not present

## 2024-09-08 DIAGNOSIS — D0472 Carcinoma in situ of skin of left lower limb, including hip: Secondary | ICD-10-CM | POA: Diagnosis not present

## 2024-09-08 DIAGNOSIS — D485 Neoplasm of uncertain behavior of skin: Secondary | ICD-10-CM | POA: Diagnosis not present

## 2024-09-14 ENCOUNTER — Other Ambulatory Visit (HOSPITAL_COMMUNITY): Payer: Self-pay

## 2024-09-14 ENCOUNTER — Telehealth: Payer: Self-pay | Admitting: *Deleted

## 2024-09-14 NOTE — Telephone Encounter (Unsigned)
 Pt aware Received notification from [facility] regarding a prior authorization for ESBRIET . Authorization has been APPROVED from 09/14/24 to 11/17/25. Approval letter sent to scan center.   Per test claim, copay for 30 days supply is $377.51   Authorization # [id number] Phone # [phone]     Completed Esbriet  PAP renewal form and faxed with insurance card and pa approval letter to [place]. Will update when we receive a response.   Phone #: [phone] Fax #: [phone]   Copied from CRM [id number]. Topic: Clinical - Medication Question >> Jul 26, 2024 10:28 AM Rozanna MATSU wrote: Reason for CRM: pt stated she received a letter from [facility] about the medicine ESBRIET  267 MG TABS [id number] [facility] new starting 10/01) stated this is the number you all need to reach the company for the grant. She stated the letter is advising no more medication orders on the current step up after  10/24. She want to reach out to provider and nurse to see what she needs to do so she can continue to get this medications with grant. She also stated another number to contact is [phone] [facility] before 10/01). >> Sep 13, 2024  4:19 PM Leila BROCKS wrote: Joana from [place] [phone] option 5 states patient's foundation ESBRIET  267 MG TABS form will expire tomorrow. Also, Joana noticed a fax form from the office failed yesterday. Joana wants to verify the office fax number, confirme the office fax number. Per CAL, warm transferred to pharmacy.

## 2024-09-14 NOTE — Telephone Encounter (Signed)
 Received notification from [employer] regarding a prior authorization for ESBRIET . Authorization has been APPROVED from 09/14/24 to 11/17/25. Approval letter sent to scan center.  Per test claim, copay for 30 days supply is $377.51  Authorization # [id number] Phone # [phone]   Completed Esbriet  PAP renewal form and faxed with insurance card and pa approval letter to [place]. Will update when we receive a response.  Phone #: [phone] Fax #: [phone]

## 2024-09-14 NOTE — Telephone Encounter (Signed)
 Received [employer] Esbriet  PAP form. Will attempt to get insurance coverage before faxing form. Submitted a Prior Authorization request to [employer] for ESBRIET  via CoverMyMeds. Will update once we receive a response.  Key: [id number]

## 2024-09-29 NOTE — Telephone Encounter (Signed)
 Called [employer] for update on Esbriet  PAP enrollment. Per rep, Esbriet  is managed by [facility] now (as of 09/18/2024). Patient's profile was transferred over to [facility]. They need updated prescription which can be faxed to pharmacy. Rep states they just need an updated ESBRIET  prescription - they are faxing a prescription form to [phone] (states rx cannot be escribed?)   Phone #: [phone] Fax #: [phone]  [facility] phone: [phone]  Sherry Pennant, PharmD, MPH, BCPS, CPP Clinical Pharmacist [facility])

## 2024-09-30 NOTE — Telephone Encounter (Signed)
 Faxed Esbriet  Rx to [employer]. Fax # [phone]. Form sent to media tab for retention.

## 2024-10-01 ENCOUNTER — Ambulatory Visit
Admission: RE | Admit: 2024-10-01 | Discharge: 2024-10-01 | Disposition: A | Discharge status: Home / Self Care | Source: Ambulatory Visit | Attending: Pulmonary Disease | Admitting: Pulmonary Disease

## 2024-10-01 DIAGNOSIS — J849 Interstitial pulmonary disease, unspecified: Secondary | ICD-10-CM

## 2024-10-08 ENCOUNTER — Ambulatory Visit: Payer: Self-pay | Admitting: Pulmonary Disease

## 2024-10-19 ENCOUNTER — Telehealth: Payer: Self-pay

## 2024-10-19 ENCOUNTER — Telehealth: Payer: Self-pay | Admitting: Family Medicine

## 2024-10-19 ENCOUNTER — Other Ambulatory Visit: Payer: Self-pay | Admitting: Family Medicine

## 2024-10-19 ENCOUNTER — Encounter: Payer: Self-pay | Admitting: Pulmonary Disease

## 2024-10-19 ENCOUNTER — Ambulatory Visit: Admitting: Pulmonary Disease

## 2024-10-19 VITALS — BP 114/68 | HR 77 | Temp 97.6°F | Ht 59.0 in | Wt 115.0 lb

## 2024-10-19 DIAGNOSIS — M069 Rheumatoid arthritis, unspecified: Secondary | ICD-10-CM

## 2024-10-19 DIAGNOSIS — J849 Interstitial pulmonary disease, unspecified: Secondary | ICD-10-CM | POA: Diagnosis not present

## 2024-10-19 DIAGNOSIS — Z5181 Encounter for therapeutic drug level monitoring: Secondary | ICD-10-CM | POA: Diagnosis not present

## 2024-10-19 LAB — COMPREHENSIVE METABOLIC PANEL WITH GFR
ALT: 9 U/L (ref 0–35)
AST: 22 U/L (ref 0–37)
Albumin: 3.9 g/dL (ref 3.5–5.2)
Alkaline Phosphatase: 63 U/L (ref 39–117)
BUN: 23 mg/dL (ref 6–23)
CO2: 35 meq/L — ABNORMAL HIGH (ref 19–32)
Calcium: 10 mg/dL (ref 8.4–10.5)
Chloride: 100 meq/L (ref 96–112)
Creatinine, Ser: 1.02 mg/dL (ref 0.40–1.20)
GFR: 48.42 mL/min — ABNORMAL LOW (ref >60.00)
Glucose, Bld: 85 mg/dL (ref 70–99)
Potassium: 4.1 meq/L (ref 3.5–5.1)
Sodium: 139 meq/L (ref 135–145)
Total Bilirubin: 0.4 mg/dL (ref 0.2–1.2)
Total Protein: 7 g/dL (ref 6.0–8.3)

## 2024-10-19 LAB — CBC WITH DIFFERENTIAL/PLATELET
Basophils Absolute: 0 K/uL (ref 0.0–0.1)
Basophils Relative: 0.8 % (ref 0.0–3.0)
Eosinophils Absolute: 0.1 K/uL (ref 0.0–0.7)
Eosinophils Relative: 2.1 % (ref 0.0–5.0)
HCT: 38.7 % (ref 36.0–46.0)
Hemoglobin: 13.1 g/dL (ref 12.0–15.0)
Lymphocytes Relative: 16.7 % (ref 12.0–46.0)
Lymphs Abs: 0.9 K/uL (ref 0.7–4.0)
MCHC: 33.8 g/dL (ref 30.0–36.0)
MCV: 99.5 fl (ref 78.0–100.0)
Monocytes Absolute: 0.6 K/uL (ref 0.1–1.0)
Monocytes Relative: 11.6 % (ref 3.0–12.0)
Neutro Abs: 3.8 K/uL (ref 1.4–7.7)
Neutrophils Relative %: 68.8 % (ref 43.0–77.0)
Platelets: 146 K/uL — ABNORMAL LOW (ref 150.0–400.0)
RBC: 3.89 Mil/uL (ref 3.87–5.11)
RDW: 14 % (ref 11.5–15.5)
WBC: 5.5 K/uL (ref 4.0–10.5)

## 2024-10-19 NOTE — Telephone Encounter (Unsigned)
 Copied from CRM #[id number]. Topic: Clinical - Medication Question >> Oct 19, 2024  1:31 PM Montie POUR wrote: Reason for CRM:  Malita wants to see if clinic can give her samples of apixaban  (ELIQUIS ) 2.5 MG TABS tablet. If you have 5.0 MG, she will cut them in half.  Please call her at [phone] to discuss samples. Thanks

## 2024-10-19 NOTE — Patient Instructions (Signed)
  VISIT SUMMARY: Today, you had a follow-up visit to check on your rheumatoid arthritis-associated pulmonary fibrosis. Your condition remains stable, and you are able to stay active, including playing golf. We reviewed your current medications and discussed monitoring your liver function and potential side effects.  YOUR PLAN: RHEUMATOID ARTHRITIS-ASSOCIATED INTERSTITIAL LUNG DISEASE: Your lung condition related to rheumatoid arthritis is stable with no new progression. Your current medications are working well. -Continue taking azathioprine 50 mg once daily. -Continue taking pirfenidone . -We will repeat your lung function test and CT scan in one year. -Schedule an annual follow-up unless your symptoms change.  THERAPEUTIC DRUG MONITORING FOR IMMUNOSUPPRESSIVE AND ANTIFIBROTIC THERAPY: We need to monitor your liver function and be aware of potential side effects from your medications. -We checked your liver function tests today. -Continue to monitor for side effects of your medications, including nausea and heart rhythm changes.                               Contains text generated by [employer]bridge.

## 2024-10-19 NOTE — Telephone Encounter (Unsigned)
 Copied from CRM #[id number]. Topic: Clinical - Medication Refill >> Oct 19, 2024  1:24 PM Montie POUR wrote: Medication:  apixaban  (ELIQUIS ) 2.5 MG TABS tablet  Has the patient contacted their pharmacy? Yes (Agent: If no, request that the patient contact the pharmacy for the refill. If patient does not wish to contact the pharmacy document the reason why and proceed with request.) (Agent: If yes, when and what did the pharmacy advise?) Pharmacy needs order to refill  This is the patient's preferred pharmacy:  [facility] - [place], [place] - [address] [facility] [address] [place] [address] [place] [address] Phone: [phone] Fax: [phone]  Is this the correct pharmacy for this prescription? Yes If no, delete pharmacy and type the correct one.   Has the prescription been filled recently? No  Is the patient out of the medication? No  Has the patient been seen for an appointment in the last year OR does the patient have an upcoming appointment? Yes  Can we respond through MyChart? Yes  Agent: Please be advised that Rx refills may take up to 3 business days. We ask that you follow-up with your pharmacy.

## 2024-10-19 NOTE — Progress Notes (Unsigned)
 Susan Fitzgerald    [id number]    1932/02/26  Primary Care Physician:Gottschalk, Norene HERO, DO  Referring Physician: Jolinda Norene HERO, DO [address] [place],  [place] [address]  Problem list: RA ILD On imuran Ofev  poorly tolerated due to side effects On Esbriet  since December 2020  HPI: 89 y.o.  with past medical history of atrial fibrillation, rheumatoid arthritis, pulmonary fibrosis.  Referred here for a second opinion on pulmonary fibrosis from Dr. Brenna  She has been diagnosed with rheumatoid arthritis around 2017.  She follows with Dr. Mai.  She was initially on methotrexate  which was held briefly in 2019 due to elevated creatinine, but restarted around November 2019 for worsening symptoms.  She did not tolerate leflunomide due to significant hair loss.  Methotrexate  was stopped and she was started started on azathioprine after discussion with Dr. Mai in March 2020  Started on Ofev  2020 for progressive fibrosing interstitial lung disease in probable UIP pattern.  This was poorly tolerated due to diarrhea, constipation and lower GI bleed Antifibrotic's change to Esbriet  in December 2020 which is tolerated much better than Ofev  Esbriet  was changed to generic pirfenidone  in summer 2022.  She does not like the full effect as the pills are bigger and went back to Esbriet  in September 2022  Finished pulmonary rehab in 2020 with improvement in symptoms of dyspnea.  She is back to golfing  Diagnosed with COVID-07 October 2019.  Did not require hospitalization. Follows with Dr. Lavona for management of atrial fibrillation  Extended ILD questionnaire 02/12/2019-no known exposures, no mold, hot tub, Jacuzzi, humidifier, no birds at home. Smoking history: Never smoker Travel history: No significant travel history Relevant family history: No family history of lung disease or pulmonary fibrosis.  Interim history: Discussed the use of AI scribe software for clinical  note transcription with the patient, who gave verbal consent to proceed.  History of Present Illness Susan Fitzgerald is an 89 year old female with rheumatoid arthritis interstitial lung disease who presents with increased shortness of breath.    She has experienced increased shortness of breath over the past two to two and a half weeks, particularly during activities such as climbing stairs, walking up her driveway, and playing golf. She has reduced her golf activity from eighteen holes to nine due to fatigue. She had the flu a little over two weeks ago, which initially affected her breathing, but she feels that has mostly resolved.  She is currently on Esbriet  (pirfenidone ) and azathioprine 50 mg for her rheumatoid arthritis interstitial lung disease. She previously took amiodarone  and methotrexate  but was taken off these medications after developing lung issues. She had difficulty tolerating nintedanib in the past. She sometimes misses doses of Esbriet  due to a loss of appetite during her flu illness, but she always takes at least two doses a day.  She reports a raspy voice and throat irritation, which she attributes to phlegm resting in her throat. She does not typically cough up phlegm unless she is ill, as was the case during her recent flu. She is not currently taking any medication for postnasal drip or acid reflux. She is concerned about her ability to communicate effectively, especially as her husband has declining hearing and vision.  She has a history of thrombocytopenia and was under the care of a PA named Lamon for this condition. Extensive labs were conducted in February, and she was  released from care as her platelet levels were stable. She also has chronic kidney disease with stable creatinine levels and has been seeing a urologist, Dr. Elisabeth, for recurrent UTIs. She is scheduled to see a nephrologist, Dr. Norine, in June.  She has a history of atrial fibrillation and mentions that  she stays in Afib all the time. She completed pulmonary rehab in 2020, attending twice a week for three months, and has previously participated in cardiac rehab.   [name] is a 89 year old female with rheumatoid arthritis-associated pulmonary fibrosis who presents for routine follow-up. She is accompanied by her husband.  Rheumatoid arthritis-associated pulmonary fibrosis - Pulmonary fibrosis associated with rheumatoid arthritis - Takes pirfenidone  and azathioprine 50 mg once daily for pulmonary fibrosis - Previously discontinued Ofev  due to adverse effects - Remains active without limitation in daily activities, including playing golf year-round  Gastrointestinal symptoms - Occasional nausea - Uses ondansetron  as needed for nausea  Cardiac arrhythmia - Atrial fibrillation   Outpatient Encounter Medications as of 10/19/2024  Medication Sig   apixaban  (ELIQUIS ) 2.5 MG TABS tablet Take 1 tablet (2.5 mg total) by mouth 2 (two) times daily.   azaTHIOprine (IMURAN) 50 MG tablet Take 50 mg by mouth daily.    Calcium  Carbonate-Vitamin D  600-10 MG-MCG TABS Take 1 tablet by mouth 2 (two) times daily.   diltiazem  (CARDIZEM  CD) 300 MG 24 hr capsule Take 1 capsule (300 mg total) by mouth daily.   ESBRIET  267 MG TABS Take 3 tablets (801 mg total) by mouth with breakfast, with lunch, and with evening meal.   estradiol (ESTRACE) 0.1 MG/GM vaginal cream Place 1 Applicatorful vaginally at bedtime.   furosemide  (LASIX ) 20 MG tablet Take 1 tablet (20 mg total) by mouth daily.   gabapentin  (NEURONTIN ) 100 MG capsule Take 100 mg by mouth at bedtime.    ketoconazole (NIZORAL) 2 % shampoo Apply 1 Application topically daily as needed for irritation.   levothyroxine  (SYNTHROID ) 75 MCG tablet Take 1 tablet (75 mcg total) by mouth daily before breakfast.   lisinopril  (ZESTRIL ) 20 MG tablet TAKE 1 TABLET BY MOUTH DAILY   lisinopril -hydrochlorothiazide  (ZESTORETIC ) 20-12.5 MG tablet TAKE 1 TABLET BY MOUTH  DAILY IN  THE MORNING   Melatonin 5 MG TABS Take 5 mg by mouth at bedtime.   Multiple Vitamin (MULTIVITAMIN) capsule Take 1 capsule by mouth daily.   ondansetron  (ZOFRAN -ODT) 4 MG disintegrating tablet DISSOLVE 1 TABLET ON THE TONGUE  EVERY 8 HOURS AS NEEDED FOR  NAUSEA AND VOMITING   potassium chloride  SA (KLOR-CON  M) 20 MEQ tablet TAKE 1 TABLET BY MOUTH DAILY   Probiotic Product (PROBIOTIC-10 ULTIMATE PO) Take 1 tablet by mouth daily.   No facility-administered encounter medications on file as of 10/19/2024.   Physical Exam: Blood pressure 107/65, pulse (!) 102, SpO2 95%. Gen:      No acute distress HEENT:  EOMI, sclera anicteric Neck:     No masses; no thyromegaly Lungs:    Bibasilar crackles CV:         Regular rate and rhythm; no murmurs Abd:      + bowel sounds; soft, non-tender; no palpable masses, no distension Ext:    No edema; adequate peripheral perfusion Skin:      Warm and dry; no rash Neuro: alert and oriented x 3 Psych: normal mood and affect   Data Reviewed: Imaging: CT chest 02/09/2017- mild reticulation, interstitial prominence at the bases. CT chest 06/23/2018- groundglass attenuation, interlobular septal thickening, subpleural  reticulation mild air trapping. CT chest 01/13/2019- peripheral and basilar pattern of subpleural reticulation, groundglass and traction bronchiectasis.  No honeycombing.  Probable UIP pattern. CT high-resolution 08/31/2019-stable pulmonary fibrosis probable UIP pattern CT high-resolution 09/06/2020-stable probable UIP pattern pulmonary fibrosis CT high-resolution 09/10/2021-stable pulmonary fibrosis, new 5 mm lung nodule CT high-resolution 09/17/2022-mild progression of probable UIP pulmonary fibrosis.  Left apex lung nodule has resolved. CT high-resolution 11//2024-   mild progression of probable UIP pulmonary fibrosis I reviewed the images personally.  PFTs: 01/20/2019 FVC 2.17 [114%), FEV1 1.67 [120%],/F 77, TLC 78%, DLCO 56% Minimal  restriction with moderate diffusion impairment.  06/05/2020 FVC 2.17 [119%], FEV1 1.68 [127%], F/F 77, TLC 4.36 [99%], DLCO 10.30 [65%] Mild diffusion impairment.  Improvement compared to 2020  03/28/2023 FVC 1.92 [190%], FEV1 1.50 [116%], F/F78, TLC 4.12 [92%], DLCO 9.83 [61%] Moderate diffusion impairment with progression compared to 2021  03/30/2024 FVC 2.08 [129%], FEV1 1.63 [140%], F/F78, TLC 4.21 [97%], DLCO corrected 9.88 [64%] Mild diffusion impairment   Labs: CCP 02/15/2016-24 Rheumatoid factor 02/14/2018-23.2  Cardiac: Echocardiogram 04/01/2022-mild reduction in RV systolic function, estimated RVSP 42.2, mildly elevated PA systolic pressure.  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  Other considerations include amiodarone  toxicity but she has been off amiodarone  for some time now.  Methotrexate  can cause pulmonary fibrosis in this pattern as well. Would not recommend work-up for IPF such as lung biopsy as it would not change our recommended management.   Echo in past with mild pulmonary hypertension.  Since she is doing well clinically we will continue to monitor this closely.  Bedside she does not want invasive procedures such as a right heart catheterization  Did not tolerate Ofev  but is doing well with esbriet .  She prefers to take 3 tablets 3 times a day as she does not like the bigger single dose tablet She is also on azathioprine 50 mg per Dr. Mai, rheumatology Finished pulmonary rehab and remains active with golf Lung function tests indicate stability with minimal progression on CT last year. She reports increased dyspnea with activities such as stair climbing and playing golf. A recent influenza episode affected her appetite and medication adherence. Pulmonary rehabilitation was discussed as a potential option if dyspnea persists.  -  Continue Esbriet  and azathioprine therapy.  Labs last month are normal - Consider pulmonary rehabilitation if dyspnea persists. - Discuss over-the-counter options for throat irritation, including Nasonex, Zyrtec, and Prilosec.  Chronic kidney disease Chronic kidney disease is well-managed with slightly elevated creatinine levels. She is under nephrology care with an upcoming appointment scheduled. - Attend nephrology appointment on June 19.  Thrombocytopenia She has thrombocytopenia and was released from oncology follow-up. No acute issues were discussed during the visit. - Monitor platelet levels as needed.   Rheumatoid arthritis-associated interstitial lung disease Well-managed with no progression of calcified areas in the lungs. Symptoms are stable, and she remains active, including playing golf. Current treatment regimen of azathioprine and pirfenidone  is effective. Previous intolerance to Ofev  noted. Lung function test earlier this year was stable. She prefers brand name medication over generic due to insurance coverage and personal preference. - Continue azathioprine 50 mg once daily. - Continue pirfenidone . - Will repeat lung function test and CT scan in one year. - Will schedule annual follow-up unless symptoms change.  Therapeutic drug monitoring for immunosuppressive and antifibrotic therapy Liver function tests have not been performed in  6-8 months. She is aware of potential side effects of medications, including nausea and heart rhythm changes with ondansetron . - Checked liver function tests today. - Continue to monitor for side effects of medications, including nausea and heart rhythm changes.  General Health Maintenance -Received flu shot last week. -Plan to receive COVID-19 booster shot in the near future. -Continue to monitor for need of RSV and pneumonia vaccines.   Plan/Recommendations: - Continue Esbriet , azathioprine - Follow-up labs for monitoring - CT scan in 6  months  Lonna Coder MD [facility] 10/19/2024, 10:22 AM  CC: Jolinda Norene HERO, DO

## 2024-10-21 ENCOUNTER — Ambulatory Visit: Payer: Self-pay | Admitting: Pulmonary Disease

## 2024-10-21 NOTE — Telephone Encounter (Signed)
 Left detailed message per signed DPR. Encouraged call back or send us  a Mychart message if there are any questions.

## 2024-10-28 ENCOUNTER — Telehealth: Payer: Self-pay | Admitting: Cardiology

## 2024-10-28 ENCOUNTER — Telehealth: Payer: Self-pay

## 2024-10-28 NOTE — Telephone Encounter (Signed)
°*  STAT* If patient is at the pharmacy, call can be transferred to refill team.   1. Which medications need to be refilled? (please list name of each medication and dose if known)   apixaban  (ELIQUIS ) 2.5 MG TABS tablet   2. Would you like to learn more about the convenience, safety, & potential cost savings by using the [facility]?   3. Are you open to using the [facility] (Type [facility]. ).  4. Which pharmacy/location (including street and city if local pharmacy) is medication to be sent to?  [facility] - [place], [place] - [address] [place]   5. Do they need a 30 day or 90 day supply?   90 day  Patient stated she still has some medication.   Patient has appointment scheduled with Dr. Lavona on 2/4.

## 2024-10-28 NOTE — Telephone Encounter (Signed)
 Patient aware to call Dr. Wynelle office since he prescribes it.  Patient voiced understanding.

## 2024-10-28 NOTE — Telephone Encounter (Signed)
 Copied from CRM #[id number]. Topic: Clinical - Prescription Issue >> Oct 28, 2024 11:39 AM Nathanel BROCKS wrote: Reason for CRM: apixaban  (ELIQUIS ) 2.5 MG TABS tablet  PT called and needs this medication asap. Please call in. If there is an issue with this please call pt and let her know. She stated that another provider prescribed it before but Dr Jolinda also refilled it. Pease advise.

## 2024-10-29 ENCOUNTER — Other Ambulatory Visit: Payer: Self-pay

## 2024-10-29 MED ORDER — APIXABAN 2.5 MG PO TABS: 2.5000 mg | ORAL_TABLET | Freq: Two times a day (BID) | ORAL | 1 refills | Status: AC

## 2024-10-29 NOTE — Telephone Encounter (Signed)
 Prescription refill request for Eliquis  received. Indication:afib Last office visit:upcoming Scr: 1.02  12/25 Age:89 Weight:52.2  kg  Prescription refilled

## 2024-11-01 ENCOUNTER — Other Ambulatory Visit: Payer: Self-pay | Admitting: Cardiology

## 2024-11-02 ENCOUNTER — Telehealth: Payer: Self-pay

## 2024-11-02 ENCOUNTER — Telehealth: Payer: Self-pay | Admitting: Pharmacist

## 2024-11-02 ENCOUNTER — Telehealth: Payer: Self-pay | Admitting: Pharmacy Technician

## 2024-11-02 ENCOUNTER — Ambulatory Visit: Attending: Cardiology | Admitting: Cardiology

## 2024-11-02 ENCOUNTER — Encounter: Payer: Self-pay | Admitting: Cardiology

## 2024-11-02 ENCOUNTER — Other Ambulatory Visit (HOSPITAL_COMMUNITY): Payer: Self-pay

## 2024-11-02 VITALS — BP 140/80 | HR 87 | Ht 59.5 in | Wt 115.0 lb

## 2024-11-02 DIAGNOSIS — I10 Essential (primary) hypertension: Secondary | ICD-10-CM

## 2024-11-02 DIAGNOSIS — I4819 Other persistent atrial fibrillation: Secondary | ICD-10-CM | POA: Diagnosis not present

## 2024-11-02 DIAGNOSIS — R0602 Shortness of breath: Secondary | ICD-10-CM | POA: Diagnosis not present

## 2024-11-02 MED ORDER — APIXABAN 2.5 MG PO TABS: 2.5000 mg | ORAL_TABLET | Freq: Two times a day (BID) | ORAL | Status: AC

## 2024-11-02 NOTE — Telephone Encounter (Signed)
 Eliquis  filled 10/29/24 -[facility] they said it would be 131 for 3 months. She said she paid that amount and the eliquis  is coming in the mail. She said she is not out. She said she wanted samples so she doesn't have to order as often to spread out that cost of 131 every 3 months.   Xarelto is showing it would be free per test claim -she is in catastrophic phase   Patient was asking for samples to tide her between fills so she doesn't have to pay the 131 as often. She said they have given her samples in the past to help with this  She doesn't appear to have cardiomyopathy so ineligible for a grant  Sent message to chris since patient is waiting in pod c

## 2024-11-02 NOTE — Progress Notes (Signed)
 Cardiology Office Note:   Date:  11/02/2024  ID:  Susan Fitzgerald, DOB 26-Jul-1932, MRN [id number] PCP: Susan Norene HERO, DO  [facility] Providers Cardiologist:  Lynwood Schilling, MD {  History of Present Illness:   Susan Fitzgerald is a 89 y.o. female  who presents for evaluation of atrial fibrillation. She was cardioverted.  However, she developed recurrent atrial fib.  She failed flecainide .    She has been followed in the atrial fib clinic.  She did describe chest pain and was sent for Lexiscan  Myoview  which did not suggest ischemia.  She does have a mildly reduced EF (40 - 45% on echo.) Her last echo in March 2019 demonstrated an improved EF of 55 - 60%.    She was started on amiodarone  and underwent DCCV. She had increased dyspnea and decreased O2 sats.  She was treated for pneumonia.  She was referred to pulmonary.  A CT was done.  There was a thought that she might have an amiodarone  pulmonary process.  Other processes could not be excluded such as a reaction to her methotrexate .  There was also suggestion of pulmonary edema.  She has been treated with diuresis.   She did have an increased creat but this is improved.   She was treated with steroids and did improve.    She has not been told that she has idiopathic idiopathic pulmonary fibrosis.  She had cardioversion again in March 2020 which she probably only helped sinus rhythm for about 4 weeks.     Since I last saw her she has had no acute cardiac complaints.  She golfed 5 days in November. The patient denies any new symptoms such as chest discomfort, neck or arm discomfort. There has been no new shortness of breath, PND or orthopnea. There have been no reported palpitations, presyncope or syncope.   ROS: As stated in the HPI and negative for all other systems.  Studies Reviewed:    EKG:   EKG Interpretation Date/Time:  Tuesday November 02 2024 13:47:37 EST Ventricular Rate:  87 PR Interval:    QRS Duration:  106 QT  Interval:  368 QTC Calculation: 442 R Axis:   -60  Text Interpretation: Atrial fibrillation with premature ventricular or aberrantly conducted complexes Left axis deviation Left ventricular hypertrophy ( R in aVL , Cornell product , Romhilt-Estes ) When compared with ECG of 29-Oct-2023 11:13, No significant change was found Confirmed by Schilling Lynwood ([id number]) on 11/02/2024 1:55:17 PM     Risk Assessment/Calculations:    CHA2DS2-VASc Score = 4   This indicates a 4.8% annual risk of stroke. The patient's score is based upon: CHF History: 0 HTN History: 1 Diabetes History: 0 Stroke History: 0 Vascular Disease History: 0 Age Score: 2 Gender Score: 1     Physical Exam:   VS:  BP (!) 140/80 (BP Location: Left Arm)   Pulse 87   Ht 4' 11.5 (1.511 m)   Wt 115 lb (52.2 kg)   SpO2 96%   BMI 22.84 kg/m    Wt Readings from Last 3 Encounters:  11/02/24 115 lb (52.2 kg)  10/19/24 115 lb (52.2 kg)  07/16/24 114 lb (51.7 kg)     GEN: Well nourished, well developed in no acute distress NECK: No JVD; No carotid bruits CARDIAC: Irregular RR, no murmurs, rubs, gallops RESPIRATORY: Few scattered basilar fine crackles, no wheezes ABDOMEN: Soft, non-tender, non-distended EXTREMITIES:  No edema; No deformity   ASSESSMENT AND PLAN:   HTN:  Her BP is at target at home.  No change in therapy.   ATRIAL FIB:   [name] has a CHA2DS2 - VASc score of 4.  She tolerates anticoagulation.  She is up-to-date with lab work.  She has good rate control.  No change in therapy.  DYSPNEA: She says this is baseline.  She might get a little dyspneic climbing the stairs but otherwise feels well.  No change in therapy.    This has been multifactorial.  No change in therapy.  She is improved on current meds including the Esbriet .     Follow up with me in one year.   Signed, Lynwood Schilling, MD

## 2024-11-02 NOTE — Telephone Encounter (Signed)
 Medication name/dosage: Samples List: Eliquis  2.5 mg  Administration instructions: Take 1 tablet (2.5 mg) twice daily  Reason for samples: Reason for samples: unable to afford medication  Ordering provider: Lynwood Schilling, MD  *Once above information entered, route the phone encounter to CV DIV MAG ST SAMPLES and send Teams message to team member assigned to Samples for the day.

## 2024-11-02 NOTE — Patient Instructions (Signed)

## 2024-11-02 NOTE — Telephone Encounter (Signed)
 Gave 1 week of samples

## 2024-11-29 ENCOUNTER — Telehealth: Payer: Self-pay | Admitting: Pulmonary Disease

## 2024-11-29 DIAGNOSIS — J849 Interstitial pulmonary disease, unspecified: Secondary | ICD-10-CM

## 2024-11-29 NOTE — Telephone Encounter (Unsigned)
 Copied from CRM #[id number]. Topic: Clinical - Medication Refill >> Nov 29, 2024 10:20 AM Benton O wrote: Medication: ESBRIET  267 MG TABS   Has the patient contacted their pharmacy? Yes needs new refill (Agent: If no, request that the patient contact the pharmacy for the refill. If patient does not wish to contact the pharmacy document the reason why and proceed with request.) (Agent: If yes, when and what did the pharmacy advise?)  This is the patient's preferred pharmacy:  [facility]  [address] [place], [place], [address] Ph. [phone]       Is this the correct pharmacy for this prescription? Yes If no, delete pharmacy and type the correct one.   Has the prescription been filled recently? No  Is the patient out of the medication? No  Has the patient been seen for an appointment in the last year OR does the patient have an upcoming appointment? Yes  Can we respond through MyChart? Yes  Agent: Please be advised that Rx refills may take up to 3 business days. We ask that you follow-up with your pharmacy.

## 2024-12-02 MED ORDER — ESBRIET 267 MG PO TABS: 801.0000 mg | ORAL_TABLET | Freq: Three times a day (TID) | ORAL | 1 refills | Status: AC

## 2024-12-02 NOTE — Telephone Encounter (Signed)
 Rx for Esbriet  triaged to pharmacy supporting [employer] [facility] [employer].   Esbriet  801mg  three times daily   Prescriber: Dr. Theophilus  Last OV: 10/19/24  Next OV: due December 2026  Labs: LFTs wnl 10/19/24

## 2024-12-13 ENCOUNTER — Other Ambulatory Visit: Payer: Self-pay | Admitting: Family Medicine

## 2024-12-13 ENCOUNTER — Other Ambulatory Visit: Payer: Self-pay | Admitting: Cardiology

## 2024-12-15 NOTE — Telephone Encounter (Signed)
 Please confirm patient is requesting refill on this PRN. She doesn't use this regularly.

## 2024-12-22 ENCOUNTER — Ambulatory Visit: Admitting: Cardiology

## 2024-12-30 ENCOUNTER — Ambulatory Visit: Payer: Medicare Other

## 2024-12-31 ENCOUNTER — Ambulatory Visit
